# Patient Record
Sex: Female | Born: 1970 | Race: White | Hispanic: No | Marital: Married | Smoking: Current every day smoker
Health system: Southern US, Community
[De-identification: ages and names within clinical notes are randomized; demographics above are authoritative.]

## PROBLEM LIST (undated history)

## (undated) DIAGNOSIS — N289 Disorder of kidney and ureter, unspecified: Secondary | ICD-10-CM

## (undated) DIAGNOSIS — I639 Cerebral infarction, unspecified: Secondary | ICD-10-CM

## (undated) DIAGNOSIS — Z87898 Personal history of other specified conditions: Secondary | ICD-10-CM

## (undated) DIAGNOSIS — J45909 Unspecified asthma, uncomplicated: Secondary | ICD-10-CM

## (undated) DIAGNOSIS — R0989 Other specified symptoms and signs involving the circulatory and respiratory systems: Secondary | ICD-10-CM

## (undated) DIAGNOSIS — N189 Chronic kidney disease, unspecified: Secondary | ICD-10-CM

## (undated) DIAGNOSIS — M109 Gout, unspecified: Secondary | ICD-10-CM

## (undated) DIAGNOSIS — J309 Allergic rhinitis, unspecified: Secondary | ICD-10-CM

## (undated) DIAGNOSIS — T7840XA Allergy, unspecified, initial encounter: Secondary | ICD-10-CM

## (undated) DIAGNOSIS — K529 Noninfective gastroenteritis and colitis, unspecified: Secondary | ICD-10-CM

## (undated) DIAGNOSIS — G43909 Migraine, unspecified, not intractable, without status migrainosus: Secondary | ICD-10-CM

## (undated) DIAGNOSIS — E786 Lipoprotein deficiency: Secondary | ICD-10-CM

## (undated) DIAGNOSIS — R55 Syncope and collapse: Secondary | ICD-10-CM

## (undated) DIAGNOSIS — K219 Gastro-esophageal reflux disease without esophagitis: Secondary | ICD-10-CM

## (undated) DIAGNOSIS — I219 Acute myocardial infarction, unspecified: Secondary | ICD-10-CM

## (undated) DIAGNOSIS — G5600 Carpal tunnel syndrome, unspecified upper limb: Secondary | ICD-10-CM

## (undated) DIAGNOSIS — I10 Essential (primary) hypertension: Secondary | ICD-10-CM

## (undated) HISTORY — DX: Migraine, unspecified, not intractable, without status migrainosus: G43.909

## (undated) HISTORY — DX: Personal history of other specified conditions: Z87.898

## (undated) HISTORY — DX: Carpal tunnel syndrome, unspecified upper limb: G56.00

## (undated) HISTORY — DX: Allergic rhinitis, unspecified: J30.9

## (undated) HISTORY — DX: Chronic kidney disease, unspecified: N18.9

## (undated) HISTORY — PX: DILATION AND CURETTAGE OF UTERUS: SHX78

## (undated) HISTORY — DX: Unspecified asthma, uncomplicated: J45.909

## (undated) HISTORY — DX: Essential (primary) hypertension: I10

## (undated) HISTORY — PX: CARDIAC CATHETERIZATION: SHX172

## (undated) HISTORY — DX: Syncope and collapse: R55

## (undated) HISTORY — DX: Cerebral infarction, unspecified: I63.9

## (undated) HISTORY — DX: Lipoprotein deficiency: E78.6

## (undated) HISTORY — DX: Other specified symptoms and signs involving the circulatory and respiratory systems: R09.89

## (undated) HISTORY — DX: Gastro-esophageal reflux disease without esophagitis: K21.9

## (undated) HISTORY — DX: Gout, unspecified: M10.9

## (undated) HISTORY — DX: Allergy, unspecified, initial encounter: T78.40XA

---

## 2006-01-04 DIAGNOSIS — I10 Essential (primary) hypertension: Secondary | ICD-10-CM

## 2006-01-04 HISTORY — DX: Essential (primary) hypertension: I10

## 2007-09-26 ENCOUNTER — Other Ambulatory Visit: Payer: Self-pay

## 2007-09-26 ENCOUNTER — Emergency Department: Payer: Self-pay | Admitting: Emergency Medicine

## 2011-01-23 ENCOUNTER — Emergency Department: Payer: Self-pay | Admitting: Emergency Medicine

## 2011-07-19 ENCOUNTER — Inpatient Hospital Stay: Payer: Self-pay | Admitting: Internal Medicine

## 2011-07-19 LAB — COMPREHENSIVE METABOLIC PANEL
Albumin: 4.2 g/dL (ref 3.4–5.0)
Alkaline Phosphatase: 93 U/L (ref 50–136)
BUN: 11 mg/dL (ref 7–18)
Creatinine: 1.58 mg/dL — ABNORMAL HIGH (ref 0.60–1.30)
EGFR (African American): 48 — ABNORMAL LOW
EGFR (Non-African Amer.): 41 — ABNORMAL LOW
Glucose: 80 mg/dL (ref 65–99)
Osmolality: 265 (ref 275–301)
Potassium: 4.1 mmol/L (ref 3.5–5.1)
SGOT(AST): 16 U/L (ref 15–37)
Sodium: 133 mmol/L — ABNORMAL LOW (ref 136–145)
Total Protein: 7.6 g/dL (ref 6.4–8.2)

## 2011-07-19 LAB — URINALYSIS, COMPLETE
Hyaline Cast: 1
Leukocyte Esterase: NEGATIVE
Nitrite: NEGATIVE
Ph: 5 (ref 4.5–8.0)
Protein: 30
RBC,UR: 4 /HPF (ref 0–5)
Squamous Epithelial: 2

## 2011-07-19 LAB — CBC
HCT: 39.2 % (ref 35.0–47.0)
HGB: 13.1 g/dL (ref 12.0–16.0)
MCHC: 33.4 g/dL (ref 32.0–36.0)
RBC: 4.05 10*6/uL (ref 3.80–5.20)
WBC: 5.5 10*3/uL (ref 3.6–11.0)

## 2011-07-19 LAB — DRUG SCREEN, URINE
Benzodiazepine, Ur Scrn: NEGATIVE (ref ?–200)
Cannabinoid 50 Ng, Ur ~~LOC~~: NEGATIVE (ref ?–50)
MDMA (Ecstasy)Ur Screen: NEGATIVE (ref ?–500)
Opiate, Ur Screen: NEGATIVE (ref ?–300)
Phencyclidine (PCP) Ur S: NEGATIVE (ref ?–25)

## 2011-07-19 LAB — PREGNANCY, URINE: Pregnancy Test, Urine: NEGATIVE m[IU]/mL

## 2011-07-19 LAB — CK TOTAL AND CKMB (NOT AT ARMC): CK-MB: 1 ng/mL (ref 0.5–3.6)

## 2011-07-19 LAB — TROPONIN I: Troponin-I: 0.15 ng/mL — ABNORMAL HIGH

## 2011-07-20 DIAGNOSIS — G459 Transient cerebral ischemic attack, unspecified: Secondary | ICD-10-CM

## 2011-07-20 DIAGNOSIS — R748 Abnormal levels of other serum enzymes: Secondary | ICD-10-CM

## 2011-07-20 LAB — BASIC METABOLIC PANEL
Calcium, Total: 8.4 mg/dL — ABNORMAL LOW (ref 8.5–10.1)
Chloride: 103 mmol/L (ref 98–107)
Co2: 24 mmol/L (ref 21–32)
Osmolality: 266 (ref 275–301)
Potassium: 3.9 mmol/L (ref 3.5–5.1)

## 2011-07-20 LAB — CK TOTAL AND CKMB (NOT AT ARMC)
CK, Total: 64 U/L (ref 21–215)
CK-MB: 1 ng/mL (ref 0.5–3.6)
CK-MB: 0.7 ng/mL (ref 0.5–3.6)

## 2011-07-20 LAB — MAGNESIUM: Magnesium: 1.7 mg/dL — ABNORMAL LOW

## 2011-07-20 LAB — LIPID PANEL
Cholesterol: 145 mg/dL (ref 0–200)
Ldl Cholesterol, Calc: 104 mg/dL — ABNORMAL HIGH (ref 0–100)
Triglycerides: 95 mg/dL (ref 0–200)

## 2011-07-20 LAB — TROPONIN I
Troponin-I: 0.16 ng/mL — ABNORMAL HIGH
Troponin-I: 0.15 ng/mL — ABNORMAL HIGH

## 2011-07-30 ENCOUNTER — Encounter: Payer: Self-pay | Admitting: Cardiovascular Disease

## 2011-08-11 ENCOUNTER — Encounter: Payer: Self-pay | Admitting: *Deleted

## 2011-08-12 ENCOUNTER — Encounter: Payer: Self-pay | Admitting: Cardiovascular Disease

## 2011-08-12 ENCOUNTER — Ambulatory Visit (INDEPENDENT_AMBULATORY_CARE_PROVIDER_SITE_OTHER): Payer: Self-pay | Admitting: Cardiovascular Disease

## 2011-08-12 VITALS — BP 117/75 | HR 58 | Ht 61.0 in | Wt 135.2 lb

## 2011-08-12 DIAGNOSIS — R0602 Shortness of breath: Secondary | ICD-10-CM

## 2011-08-12 DIAGNOSIS — I517 Cardiomegaly: Secondary | ICD-10-CM | POA: Insufficient documentation

## 2011-08-12 DIAGNOSIS — F17200 Nicotine dependence, unspecified, uncomplicated: Secondary | ICD-10-CM

## 2011-08-12 DIAGNOSIS — I10 Essential (primary) hypertension: Secondary | ICD-10-CM | POA: Insufficient documentation

## 2011-08-12 DIAGNOSIS — N189 Chronic kidney disease, unspecified: Secondary | ICD-10-CM | POA: Insufficient documentation

## 2011-08-12 MED ORDER — CLONIDINE HCL 0.1 MG PO TABS: 0.1000 mg | ORAL_TABLET | Freq: Two times a day (BID) | ORAL | Status: DC

## 2011-08-12 MED ORDER — LISINOPRIL-HYDROCHLOROTHIAZIDE 20-12.5 MG PO TABS: 1.0000 | ORAL_TABLET | Freq: Every day | ORAL | Status: DC

## 2011-08-12 NOTE — Patient Instructions (Addendum)
You are doing well. Please continue clonidine Decrease the lisinopril HCTZ to 20/12.5 mg daily Drink plenty of fluids  Please call us if you have new issues that need to be addressed before your next appt.  Your physician wants you to follow-up in: 12 months.  You will receive a reminder letter in the mail two months in advance. If you don't receive a letter, please call our office to schedule the follow-up appointment.

## 2011-08-12 NOTE — Assessment & Plan Note (Addendum)
Moderate LVH likely secondary to chronic hypertension

## 2011-08-12 NOTE — Assessment & Plan Note (Signed)
We will reduce her HCTZ, encourage by mouth fluid intake

## 2011-08-12 NOTE — Assessment & Plan Note (Signed)
We have encouraged her to continue to work on weaning her cigarettes and smoking cessation. She will continue to work on this and does not want any assistance with chantix.  

## 2011-08-12 NOTE — Assessment & Plan Note (Signed)
Blood pressure is improved on her current medication regimen. Given recent renal dysfunction on blood work from the hospital, creatinine 1.6, we will decrease her HCTZ to 12.5 mg daily with lisinopril 20 mg daily.

## 2011-08-12 NOTE — Progress Notes (Signed)
Patient ID: Gabrielle Schultz, female    DOB: [birth year], 41 y.o.   MRN: [id number]  HPI Comments: Ms. Fairfax is a 41 year old woman with recent hospital admission in mid July 2013 for headache, nausea, right side facial tingling and numbness, found to have severe hypertension. She presents for followup from the hospital.  She reports hypertension dating back to 2008. She smokes one half pack per day. Previous echocardiogram May 2008 showed normal early function, mild to moderate AI, mild MR and TR, moderate LVH  Notes indicate chronic headaches, labile blood pressures. Blood pressure was more than 200 on arrival to the hospital Also found to have elevated creatinine 1.6 She was discharged after medication changes She reports that her blood pressure has been relatively well controlled since discharge she feels relatively well.  Echocardiogram dated 07/20/2011 shows ejection fraction 50%, moderate LVH, mild TR and mild MR CT head was essentially normal Carotid ultrasound was essentially normal MR of the brain showed areas of increased signal in the white matter distribution  EKG today shows sinus bradycardia with rate 48 beats per minute, nonspecific ST abnormality in the V5, V6, 1 and aVL consistent with LVH with strain pattern.     Outpatient Encounter Prescriptions as of 08/12/2011  Medication Sig Dispense Refill  . aspirin 81 MG tablet Take 81 mg by mouth daily.      . cloNIDine (CATAPRES) 0.1 MG tablet Take 1 tablet (0.1 mg total) by mouth 2 (two) times daily.  60 tablet  11  . ranitidine (ZANTAC) 150 MG tablet Take 150 mg by mouth daily.      Marland Kitchen  lisinopril-hydrochlorothiazide (PRINZIDE,ZESTORETIC) 20-25 MG per tablet Take 1 tablet by mouth daily.       . nicotine (NICODERM CQ - DOSED IN MG/24 HOURS) 21 mg/24hr patch Place 1 patch onto the skin daily.         Review of Systems  Constitutional: Negative.   HENT: Negative.   Eyes: Negative.   Respiratory: Negative.   Cardiovascular:  Negative.   Gastrointestinal: Negative.   Musculoskeletal: Negative.   Skin: Negative.   Neurological: Negative.   Hematological: Negative.   Psychiatric/Behavioral: Negative.   All other systems reviewed and are negative.    BP 117/75  Pulse 58  Ht 5\' 1"  (1.549 m)  Wt 135 lb 4 oz (61.349 kg)  BMI 25.56 kg/m2  Physical Exam  Nursing note and vitals reviewed. Constitutional: She is oriented to person, place, and time. She appears well-developed and well-nourished.  HENT:  Head: Normocephalic.  Nose: Nose normal.  Mouth/Throat: Oropharynx is clear and moist.  Eyes: Conjunctivae are normal. Pupils are equal, round, and reactive to light.  Neck: Normal range of motion. Neck supple. No JVD present.  Cardiovascular: Normal rate, regular rhythm, S1 normal, S2 normal, normal heart sounds and intact distal pulses.  Exam reveals no gallop and no friction rub.   No murmur heard. Pulmonary/Chest: Effort normal and breath sounds normal. No respiratory distress. She has no wheezes. She has no rales. She exhibits no tenderness.  Abdominal: Soft. Bowel sounds are normal. She exhibits no distension. There is no tenderness.  Musculoskeletal: Normal range of motion. She exhibits no edema and no tenderness.  Lymphadenopathy:    She has no cervical adenopathy.  Neurological: She is alert and oriented to person, place, and time. Coordination normal.  Skin: Skin is warm and dry. No rash noted. No erythema.  Psychiatric: She has a normal mood and affect. Her behavior is normal. Judgment  and thought content normal.         Assessment and Plan

## 2012-10-19 ENCOUNTER — Inpatient Hospital Stay: Payer: Self-pay | Admitting: Specialist

## 2012-10-19 LAB — COMPREHENSIVE METABOLIC PANEL
Albumin: 3.7 g/dL (ref 3.4–5.0)
Bilirubin,Total: 0.5 mg/dL (ref 0.2–1.0)
Chloride: 106 mmol/L (ref 98–107)
Creatinine: 1.81 mg/dL — ABNORMAL HIGH (ref 0.60–1.30)
EGFR (African American): 40 — ABNORMAL LOW
Osmolality: 280 (ref 275–301)
Potassium: 3.7 mmol/L (ref 3.5–5.1)
SGPT (ALT): 16 U/L (ref 12–78)
Sodium: 137 mmol/L (ref 136–145)
Total Protein: 7.7 g/dL (ref 6.4–8.2)

## 2012-10-19 LAB — URINALYSIS, COMPLETE
Bilirubin,UR: NEGATIVE
Glucose,UR: NEGATIVE mg/dL (ref 0–75)
Ketone: NEGATIVE
Leukocyte Esterase: NEGATIVE
Ph: 5 (ref 4.5–8.0)
Protein: 30
RBC,UR: <1 /HPF (ref 0–5)

## 2012-10-19 LAB — TROPONIN I: Troponin-I: 0.13 ng/mL — ABNORMAL HIGH

## 2012-10-19 LAB — CBC
HCT: 46.2 % (ref 35.0–47.0)
MCH: 32.2 pg (ref 26.0–34.0)
MCV: 96 fL (ref 80–100)
Platelet: 256 10*3/uL (ref 150–440)
RBC: 4.8 10*6/uL (ref 3.80–5.20)
RDW: 13.1 % (ref 11.5–14.5)
WBC: 22.3 10*3/uL — ABNORMAL HIGH (ref 3.6–11.0)

## 2012-10-19 LAB — PREGNANCY, URINE: Pregnancy Test, Urine: NEGATIVE m[IU]/mL

## 2012-10-19 LAB — CK TOTAL AND CKMB (NOT AT ARMC)
CK, Total: 57 U/L (ref 21–215)
CK-MB: 1.6 ng/mL (ref 0.5–3.6)

## 2012-10-20 DIAGNOSIS — R079 Chest pain, unspecified: Secondary | ICD-10-CM

## 2012-10-20 LAB — TROPONIN I
Troponin-I: 0.16 ng/mL — ABNORMAL HIGH
Troponin-I: 0.15 ng/mL — ABNORMAL HIGH

## 2012-10-20 LAB — CBC WITH DIFFERENTIAL/PLATELET
Basophil %: 0.5 %
Eosinophil #: 0.2 10*3/uL (ref 0.0–0.7)
Eosinophil %: 2.4 %
HCT: 31.6 % — ABNORMAL LOW (ref 35.0–47.0)
HGB: 10.9 g/dL — ABNORMAL LOW (ref 12.0–16.0)
Lymphocyte #: 2.6 10*3/uL (ref 1.0–3.6)
Lymphocyte %: 28.3 %
MCH: 32.6 pg (ref 26.0–34.0)
MCHC: 34.5 g/dL (ref 32.0–36.0)
MCV: 95 fL (ref 80–100)
Monocyte #: 0.6 x10 3/mm (ref 0.2–0.9)
Monocyte %: 6.9 %
Neutrophil #: 5.6 10*3/uL (ref 1.4–6.5)
Platelet: 150 10*3/uL (ref 150–440)
RBC: 3.34 10*6/uL — ABNORMAL LOW (ref 3.80–5.20)
RDW: 13.2 % (ref 11.5–14.5)

## 2012-10-20 LAB — BASIC METABOLIC PANEL
BUN: 13 mg/dL (ref 7–18)
Calcium, Total: 8.5 mg/dL (ref 8.5–10.1)
Chloride: 109 mmol/L — ABNORMAL HIGH (ref 98–107)
EGFR (African American): 45 — ABNORMAL LOW
EGFR (Non-African Amer.): 39 — ABNORMAL LOW
Glucose: 99 mg/dL (ref 65–99)
Osmolality: 278 (ref 275–301)
Potassium: 4 mmol/L (ref 3.5–5.1)
Sodium: 139 mmol/L (ref 136–145)

## 2012-10-20 LAB — LIPID PANEL
Cholesterol: 123 mg/dL (ref 0–200)
Ldl Cholesterol, Calc: 86 mg/dL (ref 0–100)
Triglycerides: 85 mg/dL (ref 0–200)
VLDL Cholesterol, Calc: 17 mg/dL (ref 5–40)

## 2012-10-20 LAB — CK TOTAL AND CKMB (NOT AT ARMC): CK-MB: 1.6 ng/mL (ref 0.5–3.6)

## 2012-11-01 ENCOUNTER — Telehealth: Payer: Self-pay

## 2012-11-01 NOTE — Telephone Encounter (Signed)
Request from [place] , sent to [place] on 11/01/2012.

## 2012-12-29 ENCOUNTER — Ambulatory Visit: Payer: Self-pay | Admitting: Ophthalmology

## 2013-05-18 ENCOUNTER — Inpatient Hospital Stay: Payer: Self-pay | Admitting: Internal Medicine

## 2013-05-18 LAB — CBC
HCT: 42.1 %
HGB: 14.2 g/dL
MCH: 31.6 pg
MCHC: 33.6 g/dL
MCV: 94 fL
Platelet: 241 x10 3/mm 3
RBC: 4.48 X10 6/mm 3
RDW: 12.5 %
WBC: 16.9 x10 3/mm 3 — ABNORMAL HIGH

## 2013-05-18 LAB — COMPREHENSIVE METABOLIC PANEL
ALBUMIN: 3.8 g/dL (ref 3.4–5.0)
ALK PHOS: 196 U/L — AB
ANION GAP: 11 (ref 7–16)
BUN: 21 mg/dL — ABNORMAL HIGH (ref 7–18)
Bilirubin,Total: 0.5 mg/dL (ref 0.2–1.0)
CHLORIDE: 89 mmol/L — AB (ref 98–107)
CO2: 21 mmol/L (ref 21–32)
Calcium, Total: 9 mg/dL (ref 8.5–10.1)
Creatinine: 2.05 mg/dL — ABNORMAL HIGH (ref 0.60–1.30)
EGFR (African American): 34 — ABNORMAL LOW
EGFR (Non-African Amer.): 30 — ABNORMAL LOW
Glucose: 112 mg/dL — ABNORMAL HIGH (ref 65–99)
Osmolality: 248 (ref 275–301)
Potassium: 3.4 mmol/L — ABNORMAL LOW (ref 3.5–5.1)
SGOT(AST): 29 U/L (ref 15–37)
SGPT (ALT): 18 U/L (ref 12–78)
Sodium: 121 mmol/L — ABNORMAL LOW (ref 136–145)
TOTAL PROTEIN: 7.8 g/dL (ref 6.4–8.2)

## 2013-05-18 LAB — URINALYSIS, COMPLETE
Bilirubin,UR: NEGATIVE
Glucose,UR: NEGATIVE mg/dL (ref 0–75)
Ketone: NEGATIVE
LEUKOCYTE ESTERASE: NEGATIVE
NITRITE: NEGATIVE
PH: 5 (ref 4.5–8.0)
PROTEIN: NEGATIVE
Specific Gravity: 1.006 (ref 1.003–1.030)
Squamous Epithelial: 6

## 2013-05-18 LAB — TROPONIN I: Troponin-I: 0.17 ng/mL — ABNORMAL HIGH

## 2013-05-18 LAB — CLOSTRIDIUM DIFFICILE(ARMC)

## 2013-05-19 LAB — BASIC METABOLIC PANEL
ANION GAP: 9 (ref 7–16)
BUN: 17 mg/dL (ref 7–18)
CALCIUM: 8 mg/dL — AB (ref 8.5–10.1)
CREATININE: 1.7 mg/dL — AB (ref 0.60–1.30)
Chloride: 98 mmol/L (ref 98–107)
Co2: 19 mmol/L — ABNORMAL LOW (ref 21–32)
EGFR (Non-African Amer.): 37 — ABNORMAL LOW
GFR CALC AF AMER: 43 — AB
GLUCOSE: 99 mg/dL (ref 65–99)
Osmolality: 255 (ref 275–301)
POTASSIUM: 3.8 mmol/L (ref 3.5–5.1)
SODIUM: 126 mmol/L — AB (ref 136–145)

## 2013-05-19 LAB — TSH: Thyroid Stimulating Horm: 0.393 u[IU]/mL — ABNORMAL LOW

## 2013-05-19 LAB — CBC WITH DIFFERENTIAL/PLATELET
BASOS PCT: 0.5 %
Basophil #: 0 10*3/uL (ref 0.0–0.1)
Eosinophil #: 0 10*3/uL (ref 0.0–0.7)
Eosinophil %: 0.1 %
HCT: 31.4 % — AB (ref 35.0–47.0)
HGB: 10.9 g/dL — ABNORMAL LOW (ref 12.0–16.0)
Lymphocyte #: 1.1 10*3/uL (ref 1.0–3.6)
Lymphocyte %: 15.1 %
MCH: 32.2 pg (ref 26.0–34.0)
MCHC: 34.6 g/dL (ref 32.0–36.0)
MCV: 93 fL (ref 80–100)
MONO ABS: 0.3 x10 3/mm (ref 0.2–0.9)
Monocyte %: 4 %
Neutrophil #: 5.9 10*3/uL (ref 1.4–6.5)
Neutrophil %: 80.3 %
Platelet: 170 10*3/uL (ref 150–440)
RBC: 3.38 10*6/uL — ABNORMAL LOW (ref 3.80–5.20)
RDW: 12.4 % (ref 11.5–14.5)
WBC: 7.3 10*3/uL (ref 3.6–11.0)

## 2013-05-20 LAB — BASIC METABOLIC PANEL
ANION GAP: 4 — AB (ref 7–16)
BUN: 16 mg/dL (ref 7–18)
Calcium, Total: 7.9 mg/dL — ABNORMAL LOW (ref 8.5–10.1)
Chloride: 107 mmol/L (ref 98–107)
Co2: 21 mmol/L (ref 21–32)
Creatinine: 1.65 mg/dL — ABNORMAL HIGH (ref 0.60–1.30)
EGFR (African American): 45 — ABNORMAL LOW
EGFR (Non-African Amer.): 38 — ABNORMAL LOW
GLUCOSE: 91 mg/dL (ref 65–99)
OSMOLALITY: 265 (ref 275–301)
Potassium: 3.9 mmol/L (ref 3.5–5.1)
Sodium: 132 mmol/L — ABNORMAL LOW (ref 136–145)

## 2013-05-20 LAB — STOOL CULTURE

## 2013-09-28 ENCOUNTER — Other Ambulatory Visit (HOSPITAL_COMMUNITY): Payer: Self-pay | Admitting: Physician Assistant

## 2013-09-28 DIAGNOSIS — Z1231 Encounter for screening mammogram for malignant neoplasm of breast: Secondary | ICD-10-CM

## 2013-10-09 ENCOUNTER — Ambulatory Visit (HOSPITAL_COMMUNITY)
Admission: RE | Admit: 2013-10-09 | Discharge: 2013-10-09 | Disposition: A | Discharge status: Home / Self Care | Payer: No Typology Code available for payment source | Source: Ambulatory Visit | Attending: Physician Assistant | Admitting: Physician Assistant

## 2013-10-09 DIAGNOSIS — Z1231 Encounter for screening mammogram for malignant neoplasm of breast: Secondary | ICD-10-CM

## 2013-10-11 ENCOUNTER — Other Ambulatory Visit: Payer: Self-pay | Admitting: Physician Assistant

## 2013-10-11 DIAGNOSIS — R928 Other abnormal and inconclusive findings on diagnostic imaging of breast: Secondary | ICD-10-CM

## 2013-10-19 ENCOUNTER — Other Ambulatory Visit: Payer: No Typology Code available for payment source

## 2013-11-21 DIAGNOSIS — N184 Chronic kidney disease, stage 4 (severe): Secondary | ICD-10-CM | POA: Insufficient documentation

## 2013-11-21 DIAGNOSIS — N189 Chronic kidney disease, unspecified: Secondary | ICD-10-CM | POA: Insufficient documentation

## 2013-11-21 DIAGNOSIS — N183 Chronic kidney disease, stage 3 unspecified: Secondary | ICD-10-CM | POA: Insufficient documentation

## 2013-11-21 DIAGNOSIS — I639 Cerebral infarction, unspecified: Secondary | ICD-10-CM | POA: Insufficient documentation

## 2014-03-25 DIAGNOSIS — E875 Hyperkalemia: Secondary | ICD-10-CM | POA: Insufficient documentation

## 2014-04-23 NOTE — Consult Note (Signed)
General Aspect 44 year old female  with hypertension since 2008, syncope while walking to bathroom in 2009, weakness and "numb all over" last month, labile blood pressures last month, presenting with some tingling and numbness on the right side of her face and tongue. cardiology was consulted for TIA type symptoms, elevated cardiac enz.  She has chronic headaches, labile blood rpessure. BP last month and on arrival running up higher than 355 systolic. blood pressure on arival oof 218/98, in setting of facial tingling, numbness.  She denies any drug abuse, but her urine drug screen is positive for barbiturates.  Started on BP medications here with good effect ECHO shows moderate LVH, normal EF    Present Illness . SOCIAL HISTORY: The patient smokes half pack per day. She denies any alcohol or drugs. She is married and lives with her husband. She has a 25 year old child.   FAMILY HISTORY: Mother had hyperlipidemia and gastroesophageal reflux disease and heart disease. Father has hypertension, gastroesophageal reflux disease, heart disease, and hyperlipidemia.  Sister had hypertension and end-stage renal disease and was on dialysis. She is currently deceased.  Brother had a myocardial infarction at age of 41.   Physical Exam:   GEN well developed, well nourished, no acute distress    HEENT red conjunctivae    NECK supple    RESP normal resp effort  clear BS    CARD No murmur    ABD denies tenderness  soft    LYMPH negative neck    EXTR negative edema    SKIN normal to palpation    NEURO cranial nerves intact, motor/sensory function intact    PSYCH alert, A+O to time, place, person, good insight   Review of Systems:   Subjective/Chief Complaint tingling and numbess of face    General: No Complaints    Skin: No Complaints    ENT: No Complaints    Eyes: No Complaints    Neck: No Complaints    Respiratory: No Complaints    Cardiovascular: No Complaints     Gastrointestinal: No Complaints    Genitourinary: No Complaints    Vascular: No Complaints    Musculoskeletal: No Complaints    Neurologic: Headache  facial tingling    Hematologic: No Complaints    Endocrine: No Complaints    Psychiatric: No Complaints    Review of Systems: All other systems were reviewed and found to be negative    Medications/Allergies Reviewed Medications/Allergies reviewed     "Poor Circulation":    GERD - Esophageal Reflux:    CVA/Stroke:    Hypertension:    C-Section:        Admit Diagnosis:   TIA ACCELERATED HTN ELEVATED TROPIN: 20-Jul-2011, Active, TIA ACCELERATED HTN ELEVATED TROPIN      Admit Reason:   Hypertension: (401.9) Active, ICD9, Unspecified essential hypertension  Home Medications: Medication Instructions Status  aspirin 81 mg oral tablet, chewable 1 tab(s) orally once a day Active  ranitidine 150 mg oral tablet 1 tab(s) orally 2 times a day Active  hydrochlorothiazide-lisinopril 25 mg-20 mg oral tablet 1 tab(s) orally once a day Active   Lab Results:  Routine Chem:  16-Jul-13 06:18    Result Comment TROPONIN - RESULTS VERIFIED BY REPEAT TESTING.  - CALLED TO NICOLE MINOR AT 0740  - BY GA ON 07/20/11  - READ-BACK PROCESS PERFORMED.  Result(s) reported on 20 Jul 2011 at 07:43AM.   Glucose, Serum 83   BUN 13   Creatinine (comp)  1.61  Sodium, Serum  133   Potassium, Serum 3.9   Chloride, Serum 103   CO2, Serum 24   Calcium (Total), Serum  8.4   Anion Gap  6   Osmolality (calc) 266   eGFR (African American)  47   eGFR (Non-African American)  40 (eGFR values <41mL/min/1.73 m2 may be an indication of chronic kidney disease (CKD). Calculated eGFR is useful in patients with stable renal function. The eGFR calculation will not be reliable in acutely ill patients when serum creatinine is changing rapidly. It is not useful in  patients on dialysis. The eGFR calculation may not be applicable to patients at the low and  high extremes of body sizes, pregnant women, and vegetarians.)   Magnesium, Serum  1.7 (1.8-2.4 THERAPEUTIC RANGE: 4-7 mg/dL TOXIC: > 10 mg/dL  -----------------------)   Cholesterol, Serum 145   Triglycerides, Serum 95   HDL (INHOUSE)  22   VLDL Cholesterol Calculated 19   LDL Cholesterol Calculated  104 (Result(s) reported on 20 Jul 2011 at 07:08AM.)  Cardiac:  16-Jul-13 06:18    Troponin I  0.15 (0.00-0.05 0.05 ng/mL or less: NEGATIVE  Repeat testing in 3-6 hrs  if clinically indicated. >0.05 ng/mL: POTENTIAL  MYOCARDIAL INJURY. Repeat  testing in 3-6 hrs if  clinically indicated. NOTE: An increase or decrease  of 30% or more on serial  testing suggests a  clinically important change)   CK, Total 52   CPK-MB, Serum 0.7 (Result(s) reported on 20 Jul 2011 at 07:43AM.)   EKG:   Interpretation EKG shows SInus bradycardia, rate 54, LVH    [place]ut: Hives, Itching  Vital Signs/Nurse's Notes: **Vital Signs.:   16-Jul-13 12:02   Vital Signs Type Routine   Temperature Temperature (F) 98.3   Celsius 36.8   Temperature Source Oral   Pulse Pulse 55   Respirations Respirations 18   Systolic BP Systolic BP 105   Diastolic BP (mmHg) Diastolic BP (mmHg) 68   Mean BP 80   Pulse Ox % Pulse Ox % 100   Pulse Ox Activity Level  At rest   Oxygen Delivery Room Air/ 21 %     Impression 44 year old female  with hypertension since 2008, syncope while walking to bathroom in 2009, weakness and "numb all over" last month, labile blood pressures last month, presenting with some tingling and numbness on the right side of her face and tongue.   1) HTN; BP improved, may have to hold amlodipine if BP continues to run low Could continue ACE, clonidine Case manager has indicated she could [facility] a [facility]  2) ABN EKG, elevated troponin Likely from LVH in setting of significant HTN LVH likely from long standing HTN  3) Facial numbess, tingling There are  MRI finding, uncertain if this is clinically relevent -Sx could be secondary to severe HTN.  No cardiac source of embolism noted.  No arrhythmia noted on tele. --Recommend strict BP control  4) Positive urine cuilture Uncertain if this is playing a role in her labile blood pressures Recommended she stop recreational drugs  5) Smoking She has an electronic cig and will start to use this more. Currently 1/2 ppd   Electronic Signatures: Julien Nordmann (MD)  (Signed 16-Jul-13 15:11)  Authored: General Aspect/Present Illness, History and Physical Exam, Review of System, Past Medical History, Health Issues, Home Medications, Labs, EKG , Allergies, Vital Signs/Nurse's Notes, Impression/Plan   Last Updated: 16-Jul-13 15:11 by Julien Nordmann (MD)

## 2014-04-23 NOTE — Discharge Summary (Signed)
PATIENT NAME:  Gabrielle Schultz, Gabrielle Schultz MR#:  [id number] DATE OF BIRTH:  11/22/1970  DATE OF ADMISSION:  07/19/2011 DATE OF DISCHARGE:  07/21/2011  DISCHARGE DIAGNOSES:  1. Possible transient ischemic attack with facial numbness and tingling, now resolved. No transient ischemic attack symptoms, likely due to accelerated hypertension and/or bradycardia with magnetic resonance imaging suggestive of microvascular ischemic lesions, not diagnostic of multiple sclerosis.  2. Accelerated hypertension, much improved now.  3. Abnormal electrocardiogram and elevated troponin likely due to supply/demand ischemia due to left ventricular hypertrophy.  4. 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  5. Hyperlipidemia. Diet and exercise control for time being as patient does not want to go on medication. 6. Hypomagnesemia, repleted.   SECONDARY DIAGNOSES:  1. Hypertension.  2. Gastroesophageal reflux disease.  3. Carpal tunnel syndrome.   CONSULTATIONS:  1. Neurology, Dr. Jennings Books.  2. Cardiology, Dr. Ida Rogue.   LABORATORY, DIAGNOSTIC AND RADIOLOGICAL DATA: CT scan of the head without contrast on 07/15 showed no acute intracranial abnormality.   Bilateral carotid Doppler's on 07/15 showed no hemodynamically significant stenosis. MRI of the brain without contrast on 07/16 showed findings suggestive of microvascular ischemic lesion.   2-D echocardiogram on 07/16 showed normal LV systolic function. Ejection fraction 50%. Moderate concentric left ventricular hypertrophy. Mild mitral and tricuspid regurgitation along with mild aortic regurgitation. No apparent source of cerebrovascular accident.   Urinalysis on admission was negative. Urine pregnancy test was negative.   HISTORY AND SHORT HOSPITAL COURSE: Patient is a 44 year old female with above-mentioned medical problems was admitted for possible transient ischemic attack  with facial tingling and numbness. Please see Dr. Kelton Pillar dictated history and physical for further details. Cardiology consultation was obtained with Dr. Ida Rogue who recommended echocardiogram which was obtained with results dictated above. Patient had borderline elevated troponin along with abnormal EKG which was thought to be due to left ventricular hypertrophy and long-standing hypertension and likely supply/demand ischemia. Patient underwent MRI of the brain which showed some abnormal results for which neurology consultation was obtained with Dr. Jennings Books who felt MRI findings more suggestive of microvascular ischemic lesion other than multiple sclerosis. Patient was feeling much better, was close to baseline. Her blood pressure was also improved and was discharged home on 07/17 in stable condition.   PHYSICAL EXAMINATION: VITAL SIGNS: On the date of discharge her vital signs were as follows: Temperature 98.3, heart rate 53 per minute, respirations 18 per minute, blood pressure 110/62 mmHg. She was saturating 98% to 100% on room air. Pertinent Physical Examination On the date of discharge: CARDIOVASCULAR: S1, S2 normal. No murmur, rubs, gallop. LUNGS: Clear to auscultation bilaterally. No wheezing, rales, rhonchi, crepitation. ABDOMEN: Soft, benign. NEUROLOGIC: Nonfocal examination. All other physical examination remained at the baseline.   DISCHARGE MEDICATIONS:  1. Aspirin 81 mg p.o. daily.  2. Ranitidine 150 mg p.o. b.i.d.  3. Hydrochlorothiazide/lisinopril 25/20, 1 tablet p.o. daily.  4. Clonidine 0.1 mg 1 tablet p.o. b.i.d. to be held if systolic blood pressure less than 100. 5. Nicotine 21 mg patch transdermal daily.   DISCHARGE DIET: Low sodium.   DISCHARGE ACTIVITY: As tolerated.     DISCHARGE INSTRUCTIONS AND FOLLOW UP:  1. Patient was instructed to follow up with her primary care physician at [facility] (new PCP).  2. She will need follow up with Dr. [name] from  [facility] in 2 to 4 weeks.   TOTAL  TIME DISCHARGING THIS PATIENT: 55 minutes.  ____________________________ Gabrielle Schultz. [name], MD vss:cms D: 07/21/2011 16:25:27 ET T: 07/22/2011 11:30:36 ET JOB#: [id number]  cc: [name], MD, <Dictator> [facility] [name], MD [name], [place] MD ELECTRONICALLY SIGNED 07/22/2011 22:47

## 2014-04-23 NOTE — Consult Note (Signed)
PATIENT NAME:  Gabrielle Schultz, Gabrielle Schultz MR#:  [id number] DATE OF BIRTH:  January 26, 1970  DATE OF CONSULTATION:  07/20/2011  REFERRING PHYSICIAN:  Dr. Max Sane  CONSULTING PHYSICIAN:  Io Dieujuste K. [name], MD  REASON FOR CONSULTATION: Right-sided facial tingling and numbness which has resolved, concern for MS due to abnormal MRI of the brain.  HISTORY OF PRESENT ILLNESS: [name] is a 44 year old Caucasian female who has a long-standing history of uncontrolled hypertension at least since 2008 with a strong family history of hypertensive nephropathy related death in the sister.   She mentioned that on Sunday, July 14, around 10:00 p.m. she has a sudden onset of numbness on her right lower face which lasted for around 15 minutes or so.   She also had some headache and some nausea with it, came to the ER, found to have very high blood pressure of 218/98.   Patient had a similar numbness back in June 2013 where she felt like there was whole body numbness including her face and entire body, front and back.    Patient had a similar sensation of numbness back in 2009.   Patient [place]ing [place]   She feels like she has occasional blurry vision in both eyes and she has to wear glasses but she denied any history suggestive of optic neuritis or diplopia.   She feels like her eyes are very sensitive to light.   Patient denied any history suggestive of optic neuritis or any focal weakness. She denied any symptoms of gallbladder issues.   She had a negative Uhthoff phenomenon and negative Lhermitte sign.    Patient grew up in [place]. She does not have a known vitamin D deficiency.   She denied having history of mono infection during her teenage years.  PAST MEDICAL HISTORY:  1. Hypertension.  2. Acid reflux. 3. Right hand carpal tunnel syndrome.   MEDICATIONS: I reviewed her home medication list but she is taking her antihypertensive medications very  infrequently due to financial reasons.   PAST SURGICAL HISTORY: Negative.   SOCIAL HISTORY: Significant that she smokes 1/2 pack per day. She denies any alcohol. She is living with her husband and married and she has a 81 year old child.   FAMILY HISTORY: Significant that mother has hyperlipidemia, gastroesophageal reflux disease and heart disease. Father has hypertension, gastroesophageal reflux disease, heart disease and hyperlipidemia. Sister has hypertension and end-stage renal disease, was on dialysis and died. Her brother had MI at the age of 75.   REVIEW OF SYSTEMS: 10 system review of system was asked and found negative except headache and numbness which has resolved. Nausea has resolved.   She still has some light sensitivity in her eyes but she feels like her vision is okay.   Other 10 system review of system was asked and was found to be negative.   PHYSICAL EXAMINATION:  VITAL SIGNS: Temperature 98.8, pulse 54, respiratory rate 18, blood pressure 158/78, pulse oximetry 100%.   GENERAL: She is a middle-aged Caucasian female, smells of cigarette smoke here by herself lying in the bed.   She is not in acute distress. She is well built, well nourished. She has poor dentition.   LUNGS: Clear to auscultation.   HEART: S1, S2 heart sounds. Carotid exam did not reveal any bruit.   HEENT: Her funduscopic exam was attempted but patient cannot keep her eyes straight and had upward movement and I could not see her retina  or disc really well.    MENTAL STATUS EXAMINATION: She is alert, oriented, followed two-step inverted commands. Her attention, concentration, and memory were appropriate.   Her fund of knowledge is adequate for the current events.   On her cranial nerves, her pupils are equal, round, and reactive. Extraocular movements were intact. Her visual fields seemed full. Her face was symmetric. Tongue was midline. Facial sensations were intact. Her hearing was intact. Her  shoulder shrug seemed to be okay.   Her neck is supple.   On her motor exam she has normal tone and strength of 5/5 in all extremities.   Her sensory exam was intact to light touch. Her reflexes were symmetric. Her toes were downgoing.   Her gait seems to be okay.   LABORATORY, DIAGNOSTIC AND RADIOLOGICAL DATA: On her MRI of the brain, she has white matter flare hyperintensities in her bilateral subcortical frontal white matter, left more than right. She also has 1 or 2 lesions which seems to be periventricular.   During this MRI I did not see any FLAIR sequence on sagittal plane.   ASSESSMENT AND PLAN:  1. Transient right facial numbness with significantly elevated blood pressure with headache and nausea likely in a patient who has abnormal MRI of the brain as described above.   There was a concern for multiple sclerosis but her history is not very classic for demyelinating disease but it is possible.   I would like to obtain MRI of the brain and cervical spine with and without contrast to see if she has any actively demyelinating lesions.   I would also like to obtain CSF evaluation for MS labs such as IgG index, myelin basic protein, glucose protein, cell count, etc.   Blood work-wise I would like to obtain at least ESR, vitamin D, vitamin B12, titer function tests, methylmalonic acid, etc.   I explained the patient demyelinating diseases such multiple sclerosis, etiology, pathogenesis and progression.   Patient understands this but would like to hold off on further neurological workup to rule out demyelinating disease due to financial reasons which I understand.   2. Most likely explanation of her MRI changes as well as symptoms can be explained that white matter microvascular ischemic changes in patients with vascular diseases such as hypertension and smoking. She has long-standing uncontrolled hypertension. She has even secondly changes such as chronic kidney disease as well as  moderate left ventricular hypertrophy.   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   Patient with uncontrolled hypertension can have persistent headache and her headache has been resolved after getting antihypertensive medications.   I tried to explain her the importance of having good blood pressure control and smoking sedation.   Mention dash diet.    I will see her on a p.r.n. basis. Feel free to contact me with any further questions.   ____________________________ Royetta Crochet. [name], MD hks:cms D: 07/20/2011 22:07:06 ET T: 07/21/2011 09:33:50 ET  JOB#: [id number] cc: Jarom Govan K. [name], MD, <Dictator> Royetta Crochet [facility] MD ELECTRONICALLY SIGNED 07/26/2011 7:15
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
## 2014-04-23 NOTE — Consult Note (Signed)
Brief Consult Note: Diagnosis: abonormal MRI - concern for MS, likely Hypertensive whitematter microvascular changes.   Patient was seen by consultant.   Consult note dictated.   Orders entered.   Comments: - MRI of the brain shows sucortical, some periventricular (one or 2) WM lesions but mostly close to cortex - frontal. - history not strongly suggestive of MS - Pt has long standing uncontrolled HTN with CKD, moderate LVH, smoking etc - so WM lesions likely micro-vascular ischemic lesions. - pt was offered, MRI brain, c-spine with and without contrast,CSF evaluation and blood work for MS - she would like to hold off work up and further neurological evaluation for financial reasons. Will honor her request.  Electronic Signatures: Ray Church (MD)  (Signed 16-Jul-13 21:58)  Authored: Brief Consult Note   Last Updated: 16-Jul-13 21:58 by Ray Church (MD)

## 2014-04-26 NOTE — Consult Note (Signed)
PATIENT NAME:  Gabrielle Schultz, Gabrielle Schultz MR#:  [id number] DATE OF BIRTH:  1970/05/15  DATE OF CONSULTATION:  10/19/2012  REFERRING PHYSICIAN:  Dr. Margaretmary Eddy CONSULTING PHYSICIAN:  Corey Skains, MD  REASON FOR CONSULTATION: Subendocardial myocardial infarction.   CHIEF COMPLAINT: "I have chest pain."   HISTORY OF PRESENT ILLNESS: This is a 44 year old female with tobacco abuse and hypertension and strong family history of cardiovascular disease. She had some abdominal discomfort and chest discomfort radiating into her back associated with shortness breath for approximately 1 hour and was seen in the Emergency Room with an EKG showing normal sinus rhythm with ST depression of 1 to 2 mm throughout the precordium. She had relief with nitroglycerin, oxygen and aspirin. Now her EKG is normal. She has a troponin of 0.13, creatinine of 1.8, otherwise she is having no other cardiovascular symptoms at this time.   REVIEW OF SYSTEMS: The remainder of review of systems including no evidence of vision change, ringing in the ears, hearing loss, cough, congestion, heartburn, nausea, vomiting, diarrhea, bloody stools, stomach pain, extremity pain, leg weakness, cramping in buttocks, known blood clots, headaches, blackouts, dizzy spells, nosebleeds, congestion, trouble swallowing, frequent urination, urination at night, muscle weakness, numbness, anxiety, depression, skin lesions or skin rashes.   PAST MEDICAL HISTORY:  1.  Hypertension.  2.  Hyperlipidemia.   FAMILY HISTORY: A strong family history of cardiovascular disease or early onset of cardiovascular disease with her father, mother did not.   SOCIAL HISTORY: Smokes 1 pack per day, occasionally drinking alcohol.   ALLERGIES: As listed.   MEDICATIONS:  As listed.    PHYSICAL EXAMINATION:  VITAL SIGNS: Blood pressure 122/68 bilaterally, heart rate 72 upright, reclining, and regular.  GENERAL: She is a well appearing female in no acute distress.  HEENT: No  icterus, thyromegaly, ulcers, hemorrhage, or xanthelasma.  CARDIOVASCULAR: Regular rate and rhythm. Normal S1, S2 without murmur, gallop, or rub. PMI is normal size and placement. Carotids are normal without bruit. Jugular venous pressure is normal.  LUNGS: Clear to auscultation with normal respirations.  ABDOMEN: Soft with some mild tenderness. No hepatosplenomegaly or masses. Abdominal aorta is normal size without bruit.  EXTREMITIES: Shows 2+ bilateral pulses in dorsal, pedal, radial and femoral arteries without lower extremity edema, cyanosis, clubbing or ulcers.  NEUROLOGIC: She is oriented to time, place, and person, with normal mood and affect.   ASSESSMENT: A 44 year old female with acute subendocardial myocardial infarction with EKG changes and history of cardiovascular risk factors.   RECOMMENDATIONS:  1.  Admit to telemetry with telemetry unit nursing.  2.  Heparin, nitroglycerin, oxygen and possible use of beta blocker if necessary.  3.  Proceed to cardiac catheterization to assess coronary anatomy and further treatment thereof as necessary. The patient understands the risks and benefits of cardiac catheterization. These include the possibility of death, stroke, heart attack, infection, bleeding or blood clot. She is at low risk for conscious sedation. 4.  Hypertension control with ACE inhibitor.  5.  Echocardiogram for LV dysfunction, valvular heart disease causing above symptoms.  6.  Further treatment options after above.  ____________________________ Corey Skains, MD bjk:aw D: 10/19/2012 08:26:16 ET T: 10/19/2012 08:35:04 ET JOB#: [id number]  cc: Corey Skains, MD, <Dictator> Corey Skains MD ELECTRONICALLY SIGNED 10/29/2012 8:52

## 2014-04-26 NOTE — Discharge Summary (Signed)
PATIENT NAME:  Gabrielle Schultz, Gabrielle Schultz MR#:  [id number] DATE OF BIRTH:  1970-10-28  For a detailed note, please take a look at the history and physical done on admission by Dr. Margaretmary Eddy.   DIAGNOSES AT DISCHARGE:  As follows:  1.  Acute colitis.  2.  Hypertension.  3.  Elevated troponin in the setting of demand ischemia.  4.  Chronic renal insufficiency.  5.  Gastroesophageal reflux disease.   DIET: The patient is being discharged on a low-sodium diet.   ACTIVITY: As tolerated.   FOLLOW-UP: [facility] in the  next 1 to 2 weeks.   DISCHARGE MEDICATIONS: Aspirin 81 mg daily, ranitidine 150 mg b.i.d., lisinopril 20 mg daily, clonidine 0.1 mg b.i.d., ciprofloxacin 250 mg b.i.d. x7 days, and Flagyl 5 mg b.i.d. x7 days.   PERTINENT STUDIES DONE DURING THE HOSPITAL COURSE: A CT scan of the abdomen and pelvis done without contrast on admission showing no findings suggestive of colitis, a small amount of free pelvic fluid.   A chest x-ray done on admission showing mild left lower lobe pneumonia.   A cardiac catheterization done on October 14 showing ejection fraction to be 75% and moderate noncritical 3-vessel coronary artery disease.   HOSPITAL COURSE: This is a 44 year old female who presented to the hospital with abdominal pain, nausea, vomiting and diarrhea and was incidentally also noted to have an elevated troponin.   1.  Acute colitis. This was likely the cause of patient's abdominal pain, nausea, vomiting and diarrhea. The patient was admitted to the hospital and started on IV Levaquin and Flagyl. Stool cultures were obtained for C. diff and stool for comprehensive culture and ova and parasites although they could not be collected as the patient had clinically significantly improved and had no worsening diarrhea while in the hospital. The patient presently is empirically being treated on IV ciprofloxacin and Flagyl and being discharged on oral Ciprofloaxacin and Flagyl for a few more days.  She was advanced to a regular diet which she is tolerating well without any further diarrhea, nausea, vomiting, or abdominal pain. She did have a leukocytosis of 22,000 and it has come down to as low as 9000 and she has been afebrile for the past 24 hours.  2.  Elevated troponin with the abnormal EKG. The patient had a troponin as high as 0.13. She had ST depressions in the lateral leads on admission EKG. A cardiology consult was obtained. The patient was initially started on a heparin drip, maintained on aspirin, beta blocker and also a statin. She underwent a cardiac catheterization done by Dr. Nehemiah Massed which showed noncritical 3-vessel coronary artery disease. She is currently chest pain-free and hemodynamically stable. Her elevated troponin is likely in the setting of chronic renal insufficiency and probably demand ischemia with no evidence of acute coronary syndrome.  3.  Hypertension. The patient remained hemodynamically stable. She will continue her clonidine and lisinopril.  4.  Chronic renal insufficiency. The patient's baseline creatinine is around 1.5 to 1.6. Her creatinine is currently at baseline. This further needs to be followed by her primary care physician.  5.  GERD. The patient was maintained on ranitidine and she will resume that upon discharge.   CODE STATUS: FULL CODE.   TIME SPENT ON DISCHARGE: 40 minutes.    ____________________________ Belia Heman. Verdell Carmine, MD vjs:np D: 10/20/2012 14:17:16 ET T: 10/20/2012 15:19:52 ET JOB#: [id number]  cc: Belia Heman. Verdell Carmine, MD, <Dictator> [facility] Henreitta Leber MD ELECTRONICALLY SIGNED 11/01/2012  14:37 

## 2014-04-26 NOTE — H&P (Signed)
PATIENT NAME:  Gabrielle Schultz, SPROW MR#:  [id number] DATE OF BIRTH:  April 21, 1970  DATE OF ADMISSION:  10/19/2012  PRIMARY CARE PHYSICIAN: None.  REFERRING PHYSICIAN: Loney Hering, MD  CHIEF COMPLAINT: Chest pain, abdominal pain.   HISTORY OF PRESENT ILLNESS: The patient is a 44 year old Caucasian female with past medical history of hypertension, peripheral vascular disease and no significant history of heart problems, who is presenting to the ER with a chief complaint of chest pain and abdominal pain. Last night, she suddenly started having chest pain associated with tightness, with dizziness, shortness of breath, nausea and diaphoresis. The patient got up in the middle of the night and had to use the bathroom and started sweating and could not breathe. Also complaining of lower abdominal pain and could not control bowel movement and has been having watery diarrhea. Denies any vomiting. No similar complaints in the past. The patient was seen by Dr. Nehemiah Massed several years ago as she has strong family history. Denies any fever. No recent travel. No muscle aches. The patient's troponin is positive at 0.13. WBC count is elevated at 22.3, and a CAT scan of the abdomen and pelvis has revealed colitis. Hospitalist team is called to admit the patient. The patient has received aspirin. Creatinine is 1.81, and BUN is at 19.   PAST MEDICAL HISTORY:  1. GERD.  2. Peripheral vascular disease.  3. Hypertension.  4. Right carpal tunnel syndrome.  PAST SURGICAL HISTORY: C-section.  ALLERGIES: No known drug allergies.   PSYCHOSOCIAL HISTORY: Lives at home with husband. She smokes half-pack a day. Denies alcohol or illicit drug usage.   FAMILY HISTORY: Mother had hyperlipidemia, GERD. Father had hypertension, GERD, heart disease and hyperlipidemia.   HOME MEDICATIONS:  1. Ranitidine 150 mg 2 times a day.  2. Hydrochlorothiazide/lisinopril 25/20 p.o. once daily.  3. Clonidine 0.1 mg 2 times a day.  4.  Aspirin 81 mg once daily.   REVIEW OF SYSTEMS:  CONSTITUTIONAL: Denies any fever, fatigue.  EYES: Denies blurry vision, glaucoma.  ENT: Denies epistaxis, discharge.  RESPIRATION: Denies cough, COPD.  CARDIOVASCULAR: Complaining of chest pain, dizziness, shortness of breath.  GASTROINTESTINAL: Complaining of nausea, diarrhea, lower abdominal pain. Denies any hematemesis or melena.  GENITOURINARY: No dysuria, hematuria.  GYNECOLOGIC AND BREASTS: Denies breast mass or vaginal discharge.  ENDOCRINE: Denies polyuria, nocturia. Denies any hypothyroidism.  HEMATOLOGIC AND LYMPHATIC: Denies anemia, easy bruising or bleeding.  INTEGUMENTARY: No acne, rash, lesions.  MUSCULOSKELETAL: No joint pain in the neck and back. Complaining of lower abdominal pain. NEUROLOGIC: Denies vertigo, ataxia, dementia.  PSYCHIATRIC: No ADD, OCD.   PHYSICAL EXAMINATION:  VITAL SIGNS: Temperature 97.4, pulse 97, respirations 20, blood pressure 98/52, pulse oximetry 99% on 2 liters.  GENERAL APPEARANCE: Not under acute distress. Moderately built and moderately nourished.  HEENT: Normocephalic, atraumatic. Pupils are equally reactive to light and accommodation. No scleral icterus. No conjunctival injection. No sinus tenderness. No postnasal drip. Moist mucous membranes.  NECK: Supple. No JVD. No thyromegaly. Range of motion is intact.  LUNGS: Clear to auscultation bilaterally. No accessory muscle usage. No anterior chest wall tenderness on palpation. No peripheral edema.  CARDIAC: S1, S2 normal. Regular rate and rhythm. No murmurs.  GASTROINTESTINAL: Soft. Bowel sounds are positive in all 4 quadrants. Positive lower abdominal tenderness. No rebound tenderness. No guarding, no rigidity. No hepatosplenomegaly.  NEUROLOGIC: Awake, alert, oriented x3. Motor and sensory grossly intact. Cranial nerves II through XII are intact. Reflexes are 2+.  EXTREMITIES: No edema. No cyanosis.  No clubbing.  SKIN: Warm to touch. Normal  turgor. No rashes. No lesions.  MUSCULOSKELETAL: No joint effusion, tenderness, erythema.  PSYCHIATRIC: Normal mood and affect.   LABORATORY AND IMAGING STUDIES: CAT scan of the abdomen and pelvis without IV contrast has revealed thickened colon wall, inflammation seen surrounding descending colon, compatible with colitis. Some free fluid in the pelvis, but no discrete abscess or free air. LFTs are normal. CK total 57, troponin 0.13, CPK-MB 1.3. CBC: WBC elevated at 22.3. Urinalysis: Pregnancy test is negative, nitrite, leukocyte esterase are negative. Amorphous crystals are present. BMP: BUN is elevated at 19, creatinine 1.81, glucose is 165. The rest of the BMP is normal. A 12-lead EKG has revealed sinus rhythm at 89 beats per minute, left ventricular hypertrophy, prolonged QT, ST depressions in V4 through V6. T wave inversion in aVL. Chest x-ray: No acute findings.   ASSESSMENT AND PLAN: A 44 year old female presenting to the ER with a chief complaint of chest pain associated with shortness of breath, nausea, diaphoresis as well as abdominal pain and diarrhea. Will be admitted with the following assessment and plan.   1. Chest pain with positive troponin and ST depressions, most likely non-ST elevation myocardial infarction. Will keep her n.p.o. ACS protocol including aspirin and statin. The patient will be receiving heparin bolus and heparin drip. Cardiology consult is placed and discussed with Dr. Nehemiah Massed, who might consider doing cardiac catheterization.  The patient will be on beta blocker, aspirin. 2. Acute colitis with elevated white count and lower abdominal pain. Stool tests are ordered, which are pending at this time. Implement enteric precautions. The patient will be on IV levofloxacin and Flagyl.  Pain management with morphine.  3. Chronic history of hypertension. Blood pressure is low-normal at this time. Blood pressure medications will be given with holding parameters.  4. Acid reflux.  Will provide her gastrointestinal prophylaxis.  5. Deep vein thrombosis prophylaxis with heparin drip.   CODE STATUS: She is full code. Her husband is her medical power of attorney.   Diagnosis and plan of care were discussed in detail with the patient. She is aware of the plan.   TOTAL CRITICAL CARE TIME SPENT: 50 minutes.   ____________________________ Nicholes Mango, MD ag:lb D: 10/19/2012 07:37:20 ET T: 10/19/2012 07:56:27 ET JOB#: [id number]  cc: Nicholes Mango, MD, <Dictator> Corey Skains, MD Nicholes Mango MD ELECTRONICALLY SIGNED 11/03/2012 1:44

## 2014-04-27 NOTE — Discharge Summary (Signed)
PATIENT NAME:  Gabrielle Schultz, Gabrielle Schultz MR#:  [id number] DATE OF BIRTH:  12/18/1970  DATE OF ADMISSION:  05/18/2013 DATE OF DISCHARGE:  05/20/2013  DISCHARGE DIAGNOSES: 1.  Colitis.  2.  Acute renal failure.  3.  Hyponatremia.  4.  Peripheral vascular disease.     DISCHARGE MEDICATIONS: 1.  Aspirin 81 mg p.o. daily.  2.  Ranitidine 150 mg p.o. b.i.d.   3.  Clonidine 0.1 mg b.i.d.   4.  Cipro 250 mg every 12 hours for 7 days.  5.  Flagyl 500 mg p.o. b.i.d. for 7 days.  6.  The patient is advised stop lisinopril and also HCTZ combination till she follows up with primary doctor.   CONSULTATIONS: None.   HOSPITAL COURSE:  1.  The patient is a 44 year old female patient admitted on May 15th because of diarrhea. Please look at the history and physical for full details. The patient started to have severe diarrhea associated with abdominal cramping. The patient admitted to the hospitalist service because of gastroenteritis. The patient's stool culture did not show any C. difficile. She was started on Cipro and Flagyl. Her white count on admission was elevated at 16.9. The patient has history of colitis. She was given Cipro and Flagyl, did not have any further nausea,  vomiting or diarrhea or abdominal pain, tolerated the diet and she is stable for discharging home today.  2.  Hypernatremia and acute renal failure. The patient's sodium was 121 and BUN was 21, creatinine 2.05 on admission. Received IV fluids and we stopped the HCTZ and lisinopril. Sodium improved and also renal failure resolved. Sodium today is 132, BUN 16 and creatinine 1.65. The patient does have a history of peripheral vascular disease and stroke and the patient's CT head on admission did not show any stroke. 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 The patient's does have CKD stage. 3 the patient says she follows up with [facility]. I told her to quit smoking as well. The  patient advised to continue with clonidine for blood pressure and aspirin that she was taking before. She can continue Cipro and Flagyl for colitis.   TIME SPENT ON DISCHARGE PREPARATION: More than 30 minutes.   ____________________________ Epifanio Lesches, MD sk:cs D: 05/20/2013 12:26:31 ET T: 05/20/2013 19:03:56 ET JOB#: [id number]  cc: Epifanio Lesches, MD, <Dictator> Epifanio Lesches MD ELECTRONICALLY SIGNED 05/30/2013 15:42

## 2014-04-27 NOTE — H&P (Signed)
PATIENT NAME:  Gabrielle Schultz, Gabrielle Schultz MR#:  [id number] DATE OF BIRTH:  [birth year]  DATE OF ADMISSION:  05/18/2013  PRIMARY CARE PHYSICIAN:  Nonlocal.   PRIMARY CARDIOLOGIST:  Dr. Nehemiah Massed.   REFERRING PHYSICIAN:  Dr. Lenise Arena.   CHIEF COMPLAINT:  Diarrhea.   HISTORY OF PRESENT ILLNESS:  Gabrielle Schultz is a 44 year old female with history of hypertension, peripheral vascular disease, chronic renal insufficiency presented to the Emergency Department with sudden onset of diarrhea.  The patient states was doing well before going to the race track, was watching in the middle of it started to experience discomfort in the abdomen followed by multiple episodes of diarrhea to the point that patient could not move from that area, had multiple episodes of diarrhea.  Concerning the patient is brought to the Emergency Department.  Work-up in the Emergency Department, the patient is found to have elevated BUN and creatinine.  The patient's C. diff. toxin is negative.  Mild hypokalemia.  The patient states at that time had abdominal pain, diaphoresis and shortness of breath.  The patient had mild elevation of the troponin of 0.17.  CT head without contrast did not show any acute intracranial process.  The patient denies having any weakness in any part of the body.   PAST MEDICAL HISTORY: 1.  Hypertension.  2.  Hyperlipidemia.  3.  Peripheral vascular disease.  4.  Chronic renal insufficiency with previous baseline creatinine of 1.16.  5.  Continued tobacco use.  6.  Previous history of CVA.  7.  Gastroesophageal reflux disease.  PAST SURGICAL HISTORY:  C-section.   ALLERGIES:  COCONUT.   HOME MEDICATIONS: 1.  Ranitidine 150 mg 2 times a day.  2.  Lisinopril hydrochlorothiazide 1 tablet once a day.  3.  Clonidine 0.1 mg 2 times a day.  4.  Aspirin 81 mg once a day.   SOCIAL HISTORY:  Continues to smoke 1/2 pack a day.  Denies drinking alcohol or using illicit drugs.  Lives with her husband and son.    FAMILY HISTORY:  History of heart problems.  Mother with hyperlipidemia, gastroesophageal reflux disease.  Father with hypertension, heart disease and hyperlipidemia.   REVIEW OF SYSTEMS: CONSTITUTIONAL:  Experiencing some weakness.  EYES:  No change in vision.  EARS, NOSE, THROAT:  No change in hearing.  RESPIRATORY:  No cough, shortness of breath.  CARDIOVASCULAR:  No chest pain, palpations.  GASTROINTESTINAL:  Has diarrhea and abdominal pain.  GENITOURINARY:  No dysuria or hematuria.  HEMATOLOGIC:  No easy bruising or bleeding.  SKIN:  No rash or lesions.  ENDOCRINE:  No polydipsia or polydipsia.  MUSCULOSKELETAL:  No joint pains and aches.  NEUROLOGIC:  No weakness or numbness in any part of the body.   PHYSICAL EXAMINATION: GENERAL:  This is a well-built, well-nourished, age-appropriate female lying down in the bed, not in distress.  VITAL SIGNS:  Temperature 98.4, pulse 78, blood pressure 112/68, respiratory rate of 18, oxygen saturation is 98% on room air.  HEENT:  Head normocephalic, atraumatic.  Eyes, no scleral icterus.  Conjunctivae normal.  Pupils equal and react to light.  Extraocular movements are intact.  Mucous membranes moist.  No pharyngeal erythema.  NECK:  Supple.  No lymphadenopathy.  No JVD.  No carotid bruit.  CHEST:  Has no focal tenderness.  LUNGS:  Bilaterally to auscultation.  HEART:  S1, S2 regular.  No murmurs are heard.  ABDOMEN:  Bowel sounds plus.  Soft, nontender, nondistended.  No hepatosplenomegaly.  EXTREMITIES:  No pedal edema.  Pulses 2+ throughout.  SKIN:  No rash or lesions.  MUSCULOSKELETAL:  Good range of motion in all the extremities.  NEUROLOGIC:  The patient is alert, oriented to place, person, and time.  Cranial nerves II through XII intact.  Motor 5 by 5 in upper and lower extremities.   LABORATORY DATA:  UA negative for nitrites and leukocyte esterase.  C. diff. negative.  Troponin 0.15, CK-MB 1.5, BUN 21, creatinine of 2.05, potassium  of 3.4.  The rest of all the values are within normal limits.   ASSESSMENT AND PLAN:  Gabrielle Schultz is a 44 year old female who comes to the Emergency Department with sudden onset of diarrhea.  1.  Diarrhea.  This does not seem to be any infectious cause.  The patient had brief period of explosive diarrhea with chest pain resolved by the time the patient came to the Emergency Department.  The patient did not have any bowel movements after coming to the Emergency Department other than small bowel movements.  Considering the patient's peripheral vascular disease, ischemic colitis as the patient's stool occult also is positive; however, we will keep the patient on Cipro and Flagyl even if she has underlying ischemic colitis.  The patient Clostridium difficile is negative.  2.  Acute on chronic renal insufficiency.  Continue with intravenous fluids and follow up.  3.  Hypokalemia:  We will replace by mouth.  This could be from the hydrochlorothiazide.  4.  Hypertension:  We will hold lisinopril and hydrochlorothiazide.  Continue with intravenous fluids.  5.  Continued smoking.  Counseled with the patient for five minutes.  The patient seems to have poor insight.  6.  Keep the patient on deep vein thrombosis prophylaxis with sequential compression devices.   TIME SPENT:  50 minutes.    ____________________________ Monica Becton, MD pv:ea D: 05/18/2013 23:31:36 ET T: 05/18/2013 23:50:39 ET JOB#: [id number]  cc: Monica Becton, MD, <Dictator> Monica Becton MD ELECTRONICALLY SIGNED 06/01/2013 0:40

## 2014-05-16 ENCOUNTER — Ambulatory Visit: Payer: Self-pay

## 2014-06-06 ENCOUNTER — Ambulatory Visit: Payer: Self-pay

## 2014-06-13 ENCOUNTER — Other Ambulatory Visit: Payer: Self-pay

## 2014-06-20 ENCOUNTER — Other Ambulatory Visit: Payer: Self-pay

## 2014-06-25 ENCOUNTER — Inpatient Hospital Stay
Admission: EM | Admit: 2014-06-25 | Discharge: 2014-06-27 | DRG: 394 | Disposition: A | Discharge status: Home / Self Care | Payer: Self-pay | Attending: Specialist | Admitting: Specialist

## 2014-06-25 ENCOUNTER — Emergency Department: Payer: Self-pay

## 2014-06-25 ENCOUNTER — Encounter: Payer: Self-pay | Admitting: Urgent Care

## 2014-06-25 DIAGNOSIS — Z7982 Long term (current) use of aspirin: Secondary | ICD-10-CM

## 2014-06-25 DIAGNOSIS — K635 Polyp of colon: Secondary | ICD-10-CM | POA: Diagnosis present

## 2014-06-25 DIAGNOSIS — K529 Noninfective gastroenteritis and colitis, unspecified: Secondary | ICD-10-CM | POA: Diagnosis present

## 2014-06-25 DIAGNOSIS — F17210 Nicotine dependence, cigarettes, uncomplicated: Secondary | ICD-10-CM | POA: Diagnosis present

## 2014-06-25 DIAGNOSIS — Z8249 Family history of ischemic heart disease and other diseases of the circulatory system: Secondary | ICD-10-CM

## 2014-06-25 DIAGNOSIS — K922 Gastrointestinal hemorrhage, unspecified: Secondary | ICD-10-CM

## 2014-06-25 DIAGNOSIS — K219 Gastro-esophageal reflux disease without esophagitis: Secondary | ICD-10-CM | POA: Diagnosis present

## 2014-06-25 DIAGNOSIS — I959 Hypotension, unspecified: Secondary | ICD-10-CM

## 2014-06-25 DIAGNOSIS — Z8673 Personal history of transient ischemic attack (TIA), and cerebral infarction without residual deficits: Secondary | ICD-10-CM

## 2014-06-25 DIAGNOSIS — I252 Old myocardial infarction: Secondary | ICD-10-CM

## 2014-06-25 DIAGNOSIS — I10 Essential (primary) hypertension: Secondary | ICD-10-CM | POA: Diagnosis present

## 2014-06-25 DIAGNOSIS — D6859 Other primary thrombophilia: Secondary | ICD-10-CM | POA: Diagnosis present

## 2014-06-25 DIAGNOSIS — Z79899 Other long term (current) drug therapy: Secondary | ICD-10-CM

## 2014-06-25 DIAGNOSIS — R1030 Lower abdominal pain, unspecified: Secondary | ICD-10-CM

## 2014-06-25 DIAGNOSIS — K550 Acute vascular disorders of intestine: Principal | ICD-10-CM | POA: Diagnosis present

## 2014-06-25 HISTORY — DX: Noninfective gastroenteritis and colitis, unspecified: K52.9

## 2014-06-25 HISTORY — DX: Acute myocardial infarction, unspecified: I21.9

## 2014-06-25 HISTORY — DX: Cerebral infarction, unspecified: I63.9

## 2014-06-25 LAB — CBC
HEMATOCRIT: 53.4 % — AB (ref 35.0–47.0)
Hemoglobin: 17.2 g/dL — ABNORMAL HIGH (ref 12.0–16.0)
MCH: 31.4 pg (ref 26.0–34.0)
MCHC: 32.2 g/dL (ref 32.0–36.0)
MCV: 97.5 fL (ref 80.0–100.0)
Platelets: 195 10*3/uL (ref 150–440)
RBC: 5.47 MIL/uL — ABNORMAL HIGH (ref 3.80–5.20)
RDW: 13.5 % (ref 11.5–14.5)
WBC: 14.5 10*3/uL — ABNORMAL HIGH (ref 3.6–11.0)

## 2014-06-25 LAB — COMPREHENSIVE METABOLIC PANEL
ALT: 13 U/L — ABNORMAL LOW (ref 14–54)
ANION GAP: 7 (ref 5–15)
AST: 27 U/L (ref 15–41)
Albumin: 3.8 g/dL (ref 3.5–5.0)
Alkaline Phosphatase: 115 U/L (ref 38–126)
BUN: 24 mg/dL — AB (ref 6–20)
CALCIUM: 9.2 mg/dL (ref 8.9–10.3)
CO2: 19 mmol/L — ABNORMAL LOW (ref 22–32)
Chloride: 108 mmol/L (ref 101–111)
Creatinine, Ser: 1.9 mg/dL — ABNORMAL HIGH (ref 0.44–1.00)
GFR calc Af Amer: 37 mL/min — ABNORMAL LOW (ref >60)
GFR calc non Af Amer: 32 mL/min — ABNORMAL LOW (ref >60)
Glucose, Bld: 163 mg/dL — ABNORMAL HIGH (ref 65–99)
Potassium: 3.7 mmol/L (ref 3.5–5.1)
SODIUM: 134 mmol/L — AB (ref 135–145)
TOTAL PROTEIN: 7.5 g/dL (ref 6.5–8.1)
Total Bilirubin: 0.7 mg/dL (ref 0.3–1.2)

## 2014-06-25 LAB — TROPONIN I: Troponin I: 0.04 ng/mL — ABNORMAL HIGH (ref <0.031)

## 2014-06-25 LAB — C DIFFICILE QUICK SCREEN W PCR REFLEX
C DIFFICILE (CDIFF) INTERP: NEGATIVE
C Diff antigen: NEGATIVE
C Diff toxin: NEGATIVE

## 2014-06-25 LAB — LACTIC ACID, PLASMA: LACTIC ACID, VENOUS: 1.9 mmol/L (ref 0.5–2.0)

## 2014-06-25 MED ORDER — ACETAMINOPHEN 650 MG RE SUPP: 650.0000 mg | Freq: Four times a day (QID) | RECTAL | Status: DC | PRN

## 2014-06-25 MED ORDER — NICOTINE 21 MG/24HR TD PT24
21.0000 mg | MEDICATED_PATCH | Freq: Every day | TRANSDERMAL | Status: DC
  Administered 2014-06-25 – 2014-06-27 (×3): 21 mg via TRANSDERMAL
  Filled 2014-06-25 (×3): qty 1

## 2014-06-25 MED ORDER — IOHEXOL 240 MG/ML SOLN
25.0000 mL | INTRAMUSCULAR | Status: AC
  Administered 2014-06-25 (×2): 25 mL via ORAL

## 2014-06-25 MED ORDER — SODIUM CHLORIDE 0.9 % IV BOLUS (SEPSIS)
1000.0000 mL | Freq: Once | INTRAVENOUS | Status: AC
  Administered 2014-06-25: 1000 mL via INTRAVENOUS

## 2014-06-25 MED ORDER — DICYCLOMINE HCL 10 MG/ML IM SOLN
10.0000 mg | Freq: Once | INTRAMUSCULAR | Status: AC
  Administered 2014-06-25: 10 mg via INTRAMUSCULAR

## 2014-06-25 MED ORDER — ONDANSETRON HCL 4 MG/2ML IJ SOLN: 4.0000 mg | Freq: Four times a day (QID) | INTRAMUSCULAR | Status: DC | PRN

## 2014-06-25 MED ORDER — LISINOPRIL 20 MG PO TABS
20.0000 mg | ORAL_TABLET | Freq: Every day | ORAL | Status: DC
  Administered 2014-06-25 – 2014-06-27 (×3): 20 mg via ORAL
  Filled 2014-06-25 (×3): qty 1

## 2014-06-25 MED ORDER — CIPROFLOXACIN IN D5W 400 MG/200ML IV SOLN
400.0000 mg | Freq: Two times a day (BID) | INTRAVENOUS | Status: DC
  Administered 2014-06-25 – 2014-06-26 (×2): 400 mg via INTRAVENOUS
  Filled 2014-06-25 (×5): qty 200

## 2014-06-25 MED ORDER — MORPHINE SULFATE 2 MG/ML IJ SOLN: 2.0000 mg | INTRAMUSCULAR | Status: DC | PRN

## 2014-06-25 MED ORDER — METRONIDAZOLE IN NACL 5-0.79 MG/ML-% IV SOLN
500.0000 mg | Freq: Three times a day (TID) | INTRAVENOUS | Status: DC
  Administered 2014-06-25 – 2014-06-26 (×3): 500 mg via INTRAVENOUS
  Filled 2014-06-25 (×7): qty 100

## 2014-06-25 MED ORDER — AMLODIPINE BESYLATE 5 MG PO TABS
5.0000 mg | ORAL_TABLET | Freq: Every day | ORAL | Status: DC
  Administered 2014-06-25 – 2014-06-27 (×3): 5 mg via ORAL
  Filled 2014-06-25 (×3): qty 1

## 2014-06-25 MED ORDER — POLYETHYLENE GLYCOL 3350 17 GM/SCOOP PO POWD
1.0000 | Freq: Once | ORAL | Status: AC
  Administered 2014-06-25: 255 g via ORAL
  Filled 2014-06-25: qty 255

## 2014-06-25 MED ORDER — IOHEXOL 300 MG/ML  SOLN
100.0000 mL | Freq: Once | INTRAMUSCULAR | Status: AC | PRN
  Administered 2014-06-25: 100 mL via INTRAVENOUS

## 2014-06-25 MED ORDER — CLONIDINE HCL 0.1 MG PO TABS
0.1000 mg | ORAL_TABLET | Freq: Two times a day (BID) | ORAL | Status: DC
  Administered 2014-06-25 – 2014-06-27 (×4): 0.1 mg via ORAL
  Filled 2014-06-25 (×4): qty 1

## 2014-06-25 MED ORDER — DICYCLOMINE HCL 10 MG/ML IM SOLN
INTRAMUSCULAR | Status: AC
  Administered 2014-06-25: 10 mg via INTRAMUSCULAR
  Filled 2014-06-25: qty 2

## 2014-06-25 MED ORDER — HYDROCODONE-ACETAMINOPHEN 5-325 MG PO TABS
1.0000 | ORAL_TABLET | ORAL | Status: DC | PRN
  Administered 2014-06-25: 23:00:00 1 via ORAL
  Filled 2014-06-25: qty 1

## 2014-06-25 MED ORDER — ONDANSETRON HCL 4 MG PO TABS: 4.0000 mg | ORAL_TABLET | Freq: Four times a day (QID) | ORAL | Status: DC | PRN

## 2014-06-25 MED ORDER — SODIUM CHLORIDE 0.9 % IV SOLN: INTRAVENOUS | Status: DC

## 2014-06-25 MED ORDER — FAMOTIDINE 20 MG PO TABS
20.0000 mg | ORAL_TABLET | Freq: Two times a day (BID) | ORAL | Status: DC
  Administered 2014-06-25 – 2014-06-27 (×5): 20 mg via ORAL
  Filled 2014-06-25 (×5): qty 1

## 2014-06-25 MED ORDER — SODIUM CHLORIDE 0.9 % IV SOLN
INTRAVENOUS | Status: DC
  Administered 2014-06-25 – 2014-06-26 (×2): via INTRAVENOUS

## 2014-06-25 MED ORDER — ASPIRIN 81 MG PO CHEW
81.0000 mg | CHEWABLE_TABLET | Freq: Every day | ORAL | Status: DC
  Administered 2014-06-27: 09:00:00 81 mg via ORAL
  Filled 2014-06-25 (×3): qty 1

## 2014-06-25 MED ORDER — ACETAMINOPHEN 325 MG PO TABS: 650.0000 mg | ORAL_TABLET | Freq: Four times a day (QID) | ORAL | Status: DC | PRN

## 2014-06-25 NOTE — Plan of Care (Signed)
Problem: Discharge Progression Outcomes Goal: Complications resolved/controlled Outcome: Progressing Complications are being controlled by rehydrating pt with IV fluids and adjusting diet order per GI's order. Pt verbalized she does not see a outpatient GI doctor and will need to follow up with Dr.Rein outpatient.

## 2014-06-25 NOTE — ED Notes (Signed)
CT to room to provide contrast. Patient reporting coconut allergy as well as a banana and strawberry intolerance. Patient to receive Barium instead. MD made aware. Allergy list updated.

## 2014-06-25 NOTE — ED Provider Notes (Signed)
[facility] Emergency Department Provider Note  ____________________________________________  Time seen: Approximately 450 AM  I have reviewed the triage vital signs and the nursing notes.   HISTORY  Chief Complaint Hypotension; Diarrhea; Weakness; and Abdominal Pain    HPI ANTANASIA DUKES is a 44 y.o. female who was feeling well yesterday but woke up feeling weak and dizzy. The patient felt as though she had to use the bathroom but did not make it there. The patient has a history of heart attack and CVA last year. EMS called with the patient's blood pressure being in the 70s over 40s. The patient felt that her legs were weak and she does have some abdominal pain. The patient reports she had a similar episode when she had colitis in the past. She reports that she feels all over lower abdominal cramping with no sharp specific pain. The patient does have some mild shortness of breath with no nausea or vomiting no headache or blurred vision. Patient does feel dizzy and lightheaded.He reports that her pain as a 9 out of 10 in intensity.   Past Medical History  Diagnosis Date  . History of syncope   . Acid reflux   . Carpal tunnel syndrome     right hand  . Hypertension 2008  . Syncope and collapse   . Poor circulation   . Acid reflux   . Migraines   . Colitis   . MI (myocardial infarction)     2015  . CVA (cerebral infarction)     2009, 2015    Patient Active Problem List   Diagnosis Date Noted  . Hypertension 08/12/2011  . LVH (left ventricular hypertrophy) 08/12/2011  . Smoking addiction 08/12/2011  . Chronic renal insufficiency 08/12/2011    Past Surgical History  Procedure Laterality Date  . Cesarean section  2004    Current Outpatient Rx  Name  Route  Sig  Dispense  Refill  . amLODipine (NORVASC) 5 MG tablet   Oral   Take 5 mg by mouth daily.         Marland Kitchen lisinopril (PRINIVIL,ZESTRIL) 20 MG tablet   Oral   Take 20 mg by mouth daily.          Marland Kitchen aspirin 81 MG tablet   Oral   Take 81 mg by mouth daily.         . cloNIDine (CATAPRES) 0.1 MG tablet   Oral   Take 1 tablet (0.1 mg total) by mouth 2 (two) times daily.   60 tablet   11   . EXPIRED: lisinopril-hydrochlorothiazide (PRINZIDE) 20-12.5 MG per tablet   Oral   Take 1 tablet by mouth daily.   30 tablet   11   . nicotine (NICODERM CQ - DOSED IN MG/24 HOURS) 21 mg/24hr patch   Transdermal   Place 1 patch onto the skin daily.         . ranitidine (ZANTAC) 150 MG tablet   Oral   Take 150 mg by mouth 2 (two) times daily.            Allergies Review of patient's allergies indicates no known allergies.  Family History  Problem Relation Age of Onset  . Hyperlipidemia Mother   . Heart disease Mother   . Hypertension Father   . Heart disease Father   . Hyperlipidemia Father   . Hypertension Sister     Social History History  Substance Use Topics  . Smoking status: Current Every Day Smoker --  0.50 packs/day for 18 years    Types: Cigarettes  . Smokeless tobacco: Not on file  . Alcohol Use: Yes     Comment: occasionally    Review of Systems Constitutional: No fever/chills Eyes: Blurred vision ENT: No sore throat. Cardiovascular: Denies chest pain. Respiratory: Denies shortness of breath. Gastrointestinal: Abdominal pain and diarrhea with no vomiting or nausea Genitourinary: Negative for dysuria. Musculoskeletal: Negative for back pain. Skin: Negative for rash. Neurological: Negative for headaches,   10-point ROS otherwise negative.  ____________________________________________   PHYSICAL EXAM:  VITAL SIGNS: ED Triage Vitals  Enc Vitals Group     BP 06/25/14 0500 82/65 mmHg     Pulse Rate 06/25/14 0500 99     Resp 06/25/14 0500 18     Temp 06/25/14 0500 97.5 F (36.4 C)     Temp Source 06/25/14 0500 Oral     SpO2 06/25/14 0500 99 %     Weight 06/25/14 0500 152 lb (68.947 kg)     Height 06/25/14 0500 5\' 1"  (1.549 m)      Head Cir --      Peak Flow --      Pain Score 06/25/14 0501 9     Pain Loc --      Pain Edu? --      Excl. in [place]? --     Constitutional: Alert and oriented. Pale and ill-appearing in moderate to severe distress Eyes: Conjunctivae are normal. PERRL. EOMI. Head: Atraumatic. Nose: No congestion/rhinnorhea. Mouth/Throat: Mucous membranes are moist.  Oropharynx non-erythematous. Cardiovascular: Normal rate, regular rhythm. Grossly normal heart sounds.  Good peripheral circulation. Respiratory: Normal respiratory effort.  No retractions. Lungs CTAB. Gastrointestinal: Soft with lower abdominal tenderness to palpation and decreased bowel sounds.. Genitourinary: Deferred Musculoskeletal: No lower extremity tenderness nor edema.  Neurologic:  Normal speech and language. No gross focal neurologic deficits are appreciated.  Skin:  Skin is warm, dry and intact. Pallor Psychiatric: Mood and affect are normal.   ____________________________________________   LABS (all labs ordered are listed, but only abnormal results are displayed)  Labs Reviewed  CBC - Abnormal; Notable for the following:    WBC 14.5 (*)    RBC 5.47 (*)    Hemoglobin 17.2 (*)    HCT 53.4 (*)    All other components within normal limits  COMPREHENSIVE METABOLIC PANEL - Abnormal; Notable for the following:    Sodium 134 (*)    CO2 19 (*)    Glucose, Bld 163 (*)    BUN 24 (*)    Creatinine, Ser 1.90 (*)    ALT 13 (*)    GFR calc non Af Amer 32 (*)    GFR calc Af Amer 37 (*)    All other components within normal limits  TROPONIN I - Abnormal; Notable for the following:    Troponin I 0.04 (*)    All other components within normal limits  STOOL CULTURE  C DIFFICILE QUICK SCAN W PCR REFLEX ([facility] ONLY)  LACTIC ACID, PLASMA  LACTIC ACID, PLASMA   ____________________________________________  EKG  ED ECG REPORT I, Loney Hering, the attending physician, personally viewed and interpreted this ECG.   Date:  06/25/2014  EKG Time: 457  Rate: 117  Rhythm: sinus tachycardia  Axis: Normal  Intervals:none  ST&T Change: T wave inversions in leads 1 aVL V4 V5 and V6 with some mild depression in leads 2  ____________________________________________  RADIOLOGY  CT abdomen and pelvis pending ____________________________________________   PROCEDURES  Procedure(s) performed: None  Critical Care performed: Yes, see critical care note(s)   CRITICAL CARE Performed by: Charlesetta Ivory P   Total critical care time: 45 min  Critical care time was exclusive of separately billable procedures and treating other patients.  Critical care was necessary to treat or prevent imminent or life-threatening deterioration.  Critical care was time spent personally by me on the following activities: development of treatment plan with patient and/or surrogate as well as nursing, discussions with consultants, evaluation of patient's response to treatment, examination of patient, obtaining history from patient or surrogate, ordering and performing treatments and interventions, ordering and review of laboratory studies, ordering and review of radiographic studies, pulse oximetry and re-evaluation of patient's condition.   ____________________________________________   INITIAL IMPRESSION / ASSESSMENT AND PLAN / ED COURSE  Pertinent labs & imaging results that were available during my care of the patient were reviewed by me and considered in my medical decision making (see chart for details).  This is a 44 year old female who comes in with abdominal pain hypotension and diarrhea tonight. The patient reports that she has this whenever she has colitis. We will give the patient normal saline 2 L IV 1 we did check some blood work on the patient and the patient has mildly elevated white blood cell count she is hemoconcentrated with an elevated creatinine and we will do a CT scan on the patient to evaluate for  colitis.  ----------------------------------------- 6:28 AM on 06/25/2014 -----------------------------------------  As the patient was having bowel movements the stool turned from brown to bright red blood per rectum. We will continue to monitor the patient's blood pressure as it has improved with the fluid and we will admit the patient to the hospitalist for a GI bleed.  The patient is currently still awaiting her CT scan. The patient's care was signed out to Dr. Joni Fears who will follow the CT and complete the admission to the hospitalist service. I did discuss the case with Dr. Brigitte Pulse who will accept the patient once he received the CT scan results. ____________________________________________   FINAL CLINICAL IMPRESSION(S) / ED DIAGNOSES  Final diagnoses:  Abdominal pain  Hypotension GI bleed       Loney Hering, MD 06/25/14 0740

## 2014-06-25 NOTE — ED Provider Notes (Signed)
-----------------------------------------   10:10 AM on 06/25/2014 -----------------------------------------  Assuming care from Dr. Dahlia Client.  In short, Gabrielle Schultz is a 44 y.o. female with a chief complaint of Hypotension; Diarrhea; Weakness; and Abdominal Pain .  Refer to the original H&P for additional details.   CT abdomen and pelvis reviewed. Diffuse colitis with possible pseudomembranous colitis or inflammatory component. Discussed with hospitalist for admission, will evaluate in the ED  Carrie Mew, MD 06/25/14 1011

## 2014-06-25 NOTE — Consult Note (Signed)
GI Inpatient Consult Note  Reason for Consult: colitis on CT, diarrhea, rectal bleeding.    Attending Requesting Consult:  Sainani  History of Present Illness: Gabrielle Schultz is a 44 y.o. female with PMHx notable for CVA, MI, colitis presenting for evaluation of abdominal pain, rectal bleeding, diarrhea area Mr. Munck reports she was in her usual state of health until 24 hours prior to presentation when she developed the above symptoms. She had a similar presentation she reports about 1 year ago. At that time she reports being diagnosed with colitis based on a CT scan. She's never had an endoscopic workup. She states she was not started on any medications at that time. He did not have any symptoms from that episode 1 year ago until now. Has not had any prior colonoscopies.  She has continued to have loose stools and blood in her stools here in the hospital. She did have a CT scan that showed diffuse colitis with mural edema. Her C. difficile study is negative. She continues to have abdominal pain     There is no family history of GI malignancy or inflammatory bowel disease.  Past Medical History:  Past Medical History  Diagnosis Date  . History of syncope   . Acid reflux   . Carpal tunnel syndrome     right hand  . Hypertension 2008  . Syncope and collapse   . Poor circulation   . Acid reflux   . Migraines   . Colitis   . MI (myocardial infarction)     2015  . CVA (cerebral infarction)     2009, 2015    Problem List: Patient Active Problem List   Diagnosis Date Noted  . Acute colitis 06/25/2014  . Hypertension 08/12/2011  . LVH (left ventricular hypertrophy) 08/12/2011  . Smoking addiction 08/12/2011  . Chronic renal insufficiency 08/12/2011    Past Surgical History: Past Surgical History  Procedure Laterality Date  . Cesarean section  2004    Allergies: Allergies  Allergen Reactions  . Banana   . Coconut Oil   . Strawberry     Home Medications: Prescriptions  prior to admission  Medication Sig Dispense Refill Last Dose  . amLODipine (NORVASC) 5 MG tablet Take 5 mg by mouth daily.   06/24/2014 at Unknown time  . aspirin 81 MG tablet Take 81 mg by mouth daily.   06/24/2014 at Unknown time  . cloNIDine (CATAPRES) 0.1 MG tablet Take 1 tablet (0.1 mg total) by mouth 2 (two) times daily. 60 tablet 11 06/24/2014 at Unknown time  . lisinopril (PRINIVIL,ZESTRIL) 20 MG tablet Take 20 mg by mouth daily.   06/24/2014 at Unknown time  . ranitidine (ZANTAC) 150 MG tablet Take 150 mg by mouth 2 (two) times daily.    06/24/2014 at Unknown time  . lisinopril-hydrochlorothiazide (PRINZIDE) 20-12.5 MG per tablet Take 1 tablet by mouth daily. 30 tablet 11   . nicotine (NICODERM CQ - DOSED IN MG/24 HOURS) 21 mg/24hr patch Place 1 patch onto the skin daily.   Not Taking   Home medication reconciliation was completed with the patient.   Scheduled Inpatient Medications:   . amLODipine  5 mg Oral Daily  . aspirin  81 mg Oral Daily  . ciprofloxacin  400 mg Intravenous Q12H  . cloNIDine  0.1 mg Oral BID  . famotidine  20 mg Oral BID  . lisinopril  20 mg Oral Daily  . metronidazole  500 mg Intravenous Q8H  . polyethylene glycol powder  1 Container Oral Once    Continuous Inpatient Infusions:   . sodium chloride 100 mL/hr at 06/25/14 1436    PRN Inpatient Medications:  acetaminophen **OR** acetaminophen, HYDROcodone-acetaminophen, morphine injection, ondansetron **OR** ondansetron (ZOFRAN) IV  Family History: family history includes Heart disease in her father and mother; Hyperlipidemia in her father and mother; Hypertension in her father and sister.  The patient's family history is negative for inflammatory bowel disorders, GI malignancy, or solid organ transplantation.  Social History:   reports that she has been smoking Cigarettes.  She has a 9 pack-year smoking history. She does not have any smokeless tobacco history on file. She reports that she drinks alcohol.  She reports that she does not use illicit drugs.   Review of Systems: Constitutional: Weight is stable.  Eyes: No changes in vision. ENT: No oral lesions, sore throat.  GI: see HPI.  Heme/Lymph: No easy bruising.  CV: No chest pain.  GU: No hematuria.  Integumentary: No rashes.  Neuro: No headaches.  Psych: No depression/anxiety.  Endocrine: No heat/cold intolerance.  Allergic/Immunologic: No urticaria.  Resp: No cough, SOB.  Musculoskeletal: No joint swelling.    Physical Examination: BP 133/86 mmHg  Pulse 88  Temp(Src) 98.8 F (37.1 C) (Oral)  Resp 20  Ht 5\' 1"  (1.549 m)  Wt 157 lb (71.215 kg)  BMI 29.68 kg/m2  SpO2 100%  LMP 05/20/2014 (Approximate) Gen: NAD, alert and oriented x 4 HEENT: PEERLA, EOMI, Neck: supple, no JVD or thyromegaly Chest: CTA bilaterally, no wheezes, crackles, or other adventitious sounds CV: RRR, no m/g/c/r Abd: +significant diffuse TTP, ND, +BS in all four quadrants; no HSM, guarding, ridigity, or rebound tenderness Ext: no edema, well perfused with 2+ pulses, Skin: no rash or lesions noted Lymph: no LAD  Data: Lab Results  Component Value Date   WBC 14.5* 06/25/2014   HGB 17.2* 06/25/2014   HCT 53.4* 06/25/2014   MCV 97.5 06/25/2014   PLT 195 06/25/2014    Recent Labs Lab 06/25/14 0515  HGB 17.2*   Lab Results  Component Value Date   NA 134* 06/25/2014   K 3.7 06/25/2014   CL 108 06/25/2014   CO2 19* 06/25/2014   BUN 24* 06/25/2014   CREATININE 1.90* 06/25/2014   Lab Results  Component Value Date   ALT 13* 06/25/2014   AST 27 06/25/2014   ALKPHOS 115 06/25/2014   BILITOT 0.7 06/25/2014   No results for input(s): APTT, INR, PTT in the last 168 hours.   Assessment/Plan:  [name] is a 44 y.o. female presenting for evaluation of rectal bleeding and diarrhea. She does have diffuse colitis on CT with mural edema. She continues to have rectal bleeding. She does report a prior history of colitis on previous CT scan. I do  strongly suspect she has inflammatory bowel disease. No previous colonoscopy. I would recommend a colonoscopy to evaluate for IBD and take biopsies to confirm the diagnosis. We will plan for this procedure tomorrow morning. She will need to start on Solu-Medrol if appearance is consistent with inflammatory bowel disease. We will continue to follow up her stool culture although her C. difficile is negative this time.  Recommendations: - Clear liquids today. -MiraLAX prep this evening. - Nothing by mouth after midnight -Colonoscopy in a.m. -Continue to monitor hemoglobin and setting of rectal bleeding -Further recs pending above   Thank you for the consult. Please call with questions or concerns.  REIN, Grace Blight, MD

## 2014-06-25 NOTE — H&P (Signed)
[place] [facility] [place] NAME: Gabrielle Schultz    MR#:  [id number]  DATE OF BIRTH:  29-Mar-1970  DATE OF ADMISSION:  06/25/2014  PRIMARY CARE PHYSICIAN: [facility]   REQUESTING/REFERRING PHYSICIAN: Dr. Brenton Grills  CHIEF COMPLAINT:   Chief Complaint  Patient presents with  . Hypotension  . Diarrhea  . Weakness  . Abdominal Pain    HISTORY OF PRESENT ILLNESS:  Gabrielle Schultz  is a 44 y.o. female with a known history of hypertension, GERD, previous history of colitis who presented to the hospital due to abdominal pain that began last night and woke her up from sleep. The pain is located in the lower part of abdomen nonradiating and sharp in nature. Pain is about 8 out 10 in intensity, and not associated with any fever, nausea, vomiting.  Patient's symptoms progressively got worse and therefore she came to the ER for further evaluation. Patient underwent a CT scan of abdomen pelvis which showed diffuse colitis and therefore hospitalist services were contacted further treatment and evaluation.  PAST MEDICAL HISTORY:   Past Medical History  Diagnosis Date  . History of syncope   . Acid reflux   . Carpal tunnel syndrome     right hand  . Hypertension 2008  . Syncope and collapse   . Poor circulation   . Acid reflux   . Migraines   . Colitis   . MI (myocardial infarction)     2015  . CVA (cerebral infarction)     2009, 2015    PAST SURGICAL HISTORY:   Past Surgical History  Procedure Laterality Date  . Cesarean section  2004    SOCIAL HISTORY:   History  Substance Use Topics  . Smoking status: Current Every Day Smoker -- 0.50 packs/day for 18 years    Types: Cigarettes  . Smokeless tobacco: Not on file  . Alcohol Use: Yes     Comment: occasionally    FAMILY HISTORY:   Family History  Problem Relation Age of Onset  . Hyperlipidemia Mother   . Heart disease Mother   . Hypertension Father   . Heart disease  Father   . Hyperlipidemia Father   . Hypertension Sister     DRUG ALLERGIES:   Allergies  Allergen Reactions  . Banana   . Coconut Oil   . Strawberry     REVIEW OF SYSTEMS:   Review of Systems  Constitutional: Negative for fever and weight loss.  HENT: Negative for congestion, nosebleeds and tinnitus.   Eyes: Negative for blurred vision, double vision and redness.  Respiratory: Negative for cough, hemoptysis and shortness of breath.   Cardiovascular: Negative for chest pain, orthopnea, leg swelling and PND.  Gastrointestinal: Positive for abdominal pain. Negative for nausea, vomiting, diarrhea and melena.  Genitourinary: Negative for dysuria, urgency and hematuria.  Musculoskeletal: Negative for joint pain and falls.  Skin: Negative for rash.  Neurological: Negative for dizziness, tingling, sensory change, focal weakness, seizures, weakness and headaches.  Endo/Heme/Allergies: Negative for polydipsia. Does not bruise/bleed easily.  Psychiatric/Behavioral: Negative for depression and memory loss. The patient is not nervous/anxious.     MEDICATIONS AT HOME:   Prior to Admission medications   Medication Sig Start Date End Date Taking? Authorizing Provider  amLODipine (NORVASC) 5 MG tablet Take 5 mg by mouth daily.   Yes Historical Provider, MD  aspirin 81 MG tablet Take 81 mg by mouth daily.   Yes Historical Provider, MD  cloNIDine (CATAPRES) 0.1 MG tablet Take 1 tablet (0.1 mg total) by mouth 2 (two) times daily. 08/12/11  Yes [name], MD  lisinopril (PRINIVIL,ZESTRIL) 20 MG tablet Take 20 mg by mouth daily.   Yes Historical Provider, MD  ranitidine (ZANTAC) 150 MG tablet Take 150 mg by mouth 2 (two) times daily.    Yes Historical Provider, MD  lisinopril-hydrochlorothiazide (PRINZIDE) 20-12.5 MG per tablet Take 1 tablet by mouth daily. 08/12/11 08/11/12  [name], MD  nicotine (NICODERM CQ - DOSED IN MG/24 HOURS) 21 mg/24hr patch Place 1 patch onto the skin daily.     Historical Provider, MD      VITAL SIGNS:  Blood pressure 97/62, pulse 99, temperature 97.5 F (36.4 C), temperature source Oral, resp. rate 18, height 5\' 1"  (1.549 m), weight 68.947 kg (152 lb), last menstrual period 05/20/2014, SpO2 99 %.  PHYSICAL EXAMINATION:  Physical Exam  GENERAL:  44 y.o.-year-old patient lying in the bed with no acute distress.  EYES: Pupils equal, round, reactive to light and accommodation. No scleral icterus. Extraocular muscles intact.  HEENT: Head atraumatic, normocephalic. Oropharynx and nasopharynx clear. No oropharyngeal erythema, moist oral mucosa  NECK:  Supple, no jugular venous distention. No thyroid enlargement, no tenderness.  LUNGS: Normal breath sounds bilaterally, no wheezing, rales, rhonchi. No use of accessory muscles of respiration.  CARDIOVASCULAR: S1, S2 normal. No murmurs, rubs, or gallops.  ABDOMEN: Soft, tenderness lower abdomen, no rebound or rigidity, nondistended. Bowel sounds present. No organomegaly or mass.  EXTREMITIES: No pedal edema, cyanosis, or clubbing. + 2 pedal & radial pulses b/l.   NEUROLOGIC: Cranial nerves II through XII are intact. No focal Motor or sensory deficits appreciated b/l PSYCHIATRIC: The patient is alert and oriented x 3. Good affect.  SKIN: No obvious rash, lesion, or ulcer.   LABORATORY PANEL:   CBC  Recent Labs Lab 06/25/14 0515  WBC 14.5*  HGB 17.2*  HCT 53.4*  PLT 195   ------------------------------------------------------------------------------------------------------------------  Chemistries   Recent Labs Lab 06/25/14 0515  NA 134*  K 3.7  CL 108  CO2 19*  GLUCOSE 163*  BUN 24*  CREATININE 1.90*  CALCIUM 9.2  AST 27  ALT 13*  ALKPHOS 115  BILITOT 0.7   ------------------------------------------------------------------------------------------------------------------  Cardiac Enzymes  Recent Labs Lab 06/25/14 0515  TROPONINI 0.04*    ------------------------------------------------------------------------------------------------------------------  RADIOLOGY:  Ct Abdomen Pelvis W Contrast  06/25/2014   CLINICAL DATA:  44 year old female with abdominal pain, hypotension, diarrhea, weakness. Initial encounter.  EXAM: CT ABDOMEN AND PELVIS WITH CONTRAST  TECHNIQUE: Multidetector CT imaging of the abdomen and pelvis was performed using the standard protocol following bolus administration of intravenous contrast.  CONTRAST:  15mL OMNIPAQUE IOHEXOL 300 MG/ML  SOLN  COMPARISON:  Noncontrast CT Abdomen and Pelvis 10/19/2012  FINDINGS: Stable cardiomegaly. No pericardial or pleural effusion. Negative lung bases, chronic scar or atelectasis in the right middle lobe.  No acute osseous abnormality identified.  Small volume free fluid in the pelvis as well as the lower small bowel mesentery a. fluid in the rectum. Decompressed sigmoid colon with suggestion of some mucosal hyper enhancement. Diminutive urinary bladder. Negative uterus and adnexa.  Indistinct appearance of the descending colon with wall thickening and mesenteric stranding. Mucosal hyper enhancement. The abnormal appearance continues into the transverse colon. The hepatic flexure is involved to a lesser extent, with indistinct wall thickening and mucosal hyper enhancement. The ascending colon also is affected. The appendix remains within normal limits. No dilated small bowel,  however, there is some distal small bowel mucosal hyper enhancement. The stomach is distended with oral contrast, questionable gastric antral thickening. The duodenum bulb appears to remain normal. The remaining duodenum is decompressed.  Liver, gallbladder, spleen, pancreas and adrenal glands are within normal limits. Portal venous system is patent. Aortoiliac calcified atherosclerosis noted. Major arterial structures are patent. No abdominal free fluid or free air. No lymphadenopathy. Renal cortical scarring  intermittently noted bilaterally. Renal contrast enhancement and contrast excretion otherwise appears symmetric.  IMPRESSION: 1. Diffuse colitis. Mural edema suspicious for pseudomembranous colitis. Small volume free fluid, no pneumoperitoneum. 2. There is also evidence of distal small bowel mucosal hyper enhancement also suggesting infectious/inflammatory involvement. 3. Age advanced aortoiliac soft and calcified atherosclerotic plaque. Chronic renal cortical scarring.   Electronically Signed   By: Genevie Ann M.D.   On: 06/25/2014 09:41     IMPRESSION AND PLAN:   44 year old female with past medical history of hypertension, GERD, history of colitis who presents to the hospital with abdominal pain and CT scan findings suggestive of acute colitis.   #1 acute colitis-this is likely the cause of patient's abdominal pain. Patient has not been on antibiotics recently and she is clinically not having any diarrhea presently. -We'll empirically start on IV ciprofloxacin and Flagyl. Get a Gastroenterology consult, continue supportive care with IV fluids, antiemetics, pain control with IV morphine. -Patient has not had a colonoscopy done in the past and would benefit from one. - Check stool for comprehensive culture, C. difficile if patient has diarrhea.    #2 leukocytosis-this is likely secondary to acute colitis and we'll follow white cell count with IV antibacterial therapy  #3 hypertension/essential-hemodynamically stable. -Continue lisinopril, clonidine  #4 GERD-continue Pepcid.   All the records are reviewed and case discussed with ED provider. Management plans discussed with the patient, family and they are in agreement.  CODE STATUS: Full  TOTAL TIME TAKING CARE OF THIS PATIENT: 45 minutes.    Henreitta Leber M.D on 06/25/2014 at 10:50 AM  Between 7am to 6pm - Pager - [phone]  After 6pm go to [email or web address] - password [email or web address] [facility]  [facility] [place] [facility] [facility]  Office   [phone]  CC: Primary care physician; [facility].

## 2014-06-25 NOTE — ED Notes (Signed)
CT with a change in plans. No Barium to be given. Patient to receive the oral contrast mixed in another medium (lemonade crystal light). MD made aware.

## 2014-06-25 NOTE — ED Notes (Signed)
Patient presents to ED via EMS from home. Patient OOB to bathroom tonight - felt weak and dizzy and had to sit in the floor. Patient incontinent of bowels en route to ED. EMS reporting HYPOtension; BP 78/46; Tachy to the 110s. Patient reporting diffuse abd pain.

## 2014-06-25 NOTE — ED Notes (Signed)
Patient assisted OOB to in room toilet. Bed with large amount of bloody diarrhea noted. Brief removed and it was filled with blood. Patient sat on toilet and had additional bloody diarrhea episode. Patient c/o abd spasms; MD made aware and order received for Bentyl 10mg  IM - medication has already been given. Will continue to monitor.

## 2014-06-25 NOTE — Plan of Care (Signed)
Problem: Discharge Progression Outcomes Goal: Discharge plan in place and appropriate Outcome: Progressing Admitted 06/25/14 from ER with colitis flare Clear liquid diet Pending stool culture

## 2014-06-26 ENCOUNTER — Inpatient Hospital Stay: Payer: No Typology Code available for payment source | Admitting: Anesthesiology

## 2014-06-26 ENCOUNTER — Encounter
Admission: EM | Disposition: A | Discharge status: Home / Self Care | Payer: Self-pay | Source: Home / Self Care | Attending: Specialist

## 2014-06-26 HISTORY — PX: COLONOSCOPY WITH PROPOFOL: SHX5780

## 2014-06-26 LAB — BASIC METABOLIC PANEL
ANION GAP: 3 — AB (ref 5–15)
BUN: 18 mg/dL (ref 6–20)
CALCIUM: 8.2 mg/dL — AB (ref 8.9–10.3)
CO2: 20 mmol/L — AB (ref 22–32)
Chloride: 114 mmol/L — ABNORMAL HIGH (ref 101–111)
Creatinine, Ser: 1.69 mg/dL — ABNORMAL HIGH (ref 0.44–1.00)
GFR calc Af Amer: 42 mL/min — ABNORMAL LOW (ref >60)
GFR, EST NON AFRICAN AMERICAN: 37 mL/min — AB (ref >60)
Glucose, Bld: 93 mg/dL (ref 65–99)
POTASSIUM: 4.5 mmol/L (ref 3.5–5.1)
SODIUM: 137 mmol/L (ref 135–145)

## 2014-06-26 LAB — CBC
HCT: 31.9 % — ABNORMAL LOW (ref 35.0–47.0)
HEMOGLOBIN: 10.5 g/dL — AB (ref 12.0–16.0)
MCH: 32.2 pg (ref 26.0–34.0)
MCHC: 32.9 g/dL (ref 32.0–36.0)
MCV: 98.1 fL (ref 80.0–100.0)
Platelets: 118 10*3/uL — ABNORMAL LOW (ref 150–440)
RBC: 3.26 MIL/uL — ABNORMAL LOW (ref 3.80–5.20)
RDW: 13.5 % (ref 11.5–14.5)
WBC: 9.7 10*3/uL (ref 3.6–11.0)

## 2014-06-26 SURGERY — COLONOSCOPY WITH PROPOFOL
Anesthesia: General

## 2014-06-26 MED ORDER — PROPOFOL INFUSION 10 MG/ML OPTIME
INTRAVENOUS | Status: DC | PRN
  Administered 2014-06-26: 100 ug/kg/min via INTRAVENOUS

## 2014-06-26 MED ORDER — PROPOFOL 10 MG/ML IV BOLUS
INTRAVENOUS | Status: DC | PRN
  Administered 2014-06-26: 20 mg via INTRAVENOUS
  Administered 2014-06-26: 80 mg via INTRAVENOUS

## 2014-06-26 MED ORDER — LIDOCAINE HCL (CARDIAC) 20 MG/ML IV SOLN
INTRAVENOUS | Status: DC | PRN
  Administered 2014-06-26: 100 mg via INTRAVENOUS

## 2014-06-26 NOTE — Progress Notes (Signed)
   06/26/14 1300  Clinical Encounter Type  Visited With Patient  Visit Type Initial  Consult/Referral To Chaplain  Visited with patient.  Pt told me she is doing ok.  She told me she was hungry and had not been able to eat since yesterday.  The nurse was present as the patient told me this, and the nurse said she would check and see when the tray would be brought.  Pt thanked me for coming to visit with her.  [place] [phone]

## 2014-06-26 NOTE — Plan of Care (Signed)
Problem: Discharge Progression Outcomes Goal: Other Discharge Outcomes/Goals Outcome: Progressing Plan of care progress to goal for colitis: Pain well controlled w/ Norco given x1 overnight. VSS. Pt currently declines abdominal discomfort. Diet NPO. Pt ambulates w/ standby assist.

## 2014-06-26 NOTE — Progress Notes (Signed)
Patients BP 104/42. Dr. Redmond Pulling informed, no new orders.

## 2014-06-26 NOTE — Plan of Care (Addendum)
Problem: Discharge Progression Outcomes Goal: Other Discharge Outcomes/Goals Outcome: Progressing Patient had colonoscopy today showed Ischemic colitis.  Blood work completed to r/o clotting factor issue, iv fluids and iv antibiotics discontinued.  Diet advanced to liquid diet and tolerated well.  Possible discharge home tomorrow.

## 2014-06-26 NOTE — Progress Notes (Signed)
The patient was seen this afternoon.  Hypercoagulation workup has been ordered.  So detailed consult would be dictated when all the reports are available. If patient is discharged then we can make arrangements to be seen as outpatient and follow up on all the hypercoagulable workup.  I will follow-up this patient if needed in hospital or outpatient.

## 2014-06-26 NOTE — Transfer of Care (Signed)
Immediate Anesthesia Transfer of Care Note  Patient: Gabrielle Schultz  Procedure(s) Performed: Procedure(s): COLONOSCOPY WITH PROPOFOL (N/A)  Patient Location: PACU  Anesthesia Type:General  Level of Consciousness: awake  Airway & Oxygen Therapy: Patient Spontanous Breathing and Patient connected to nasal cannula oxygen  Post-op Assessment: Report given to RN and Post -op Vital signs reviewed and stable  Post vital signs: stable  Last Vitals:  Filed Vitals:   06/26/14 1154  BP: 134/83  Pulse: 83  Temp: 35.9 C  Resp: 12    Complications: No apparent anesthesia complications

## 2014-06-26 NOTE — Op Note (Signed)
[address] [facility] Gastroenterology Patient Name: Gabrielle Schultz Procedure Date: 06/26/2014 11:03 AM MRN: [id number] Account #: [id number] Date of Birth: 1970-10-20 Admit Type: Inpatient Age: 44 Room: [facility] ENDO ROOM 1 Gender: Female Note Status: Finalized Procedure:         Colonoscopy Indications:       Clinically significant diarrhea of unexplained origin,                     Rectal bleeding, Abnormal CT of GI tract. Patient Profile:   This is a 43 year old female. Providers:         Gerrit Heck. Rayann Heman, MD Referring MD:      - -. [facility], MD (Referring MD) Medicines:         Propofol per Anesthesia Complications:     No immediate complications. Procedure:         Pre-Anesthesia Assessment:                    - Prior to the procedure, a History and Physical was                     performed, and patient medications, allergies and                     sensitivities were reviewed. The patient's tolerance of                     previous anesthesia was reviewed.                    After obtaining informed consent, the colonoscope was                     passed under direct vision. Throughout the procedure, the                     patient's blood pressure, pulse, and oxygen saturations                     were monitored continuously. The Colonoscope was                     introduced through the anus and advanced to the the                     terminal ileum. The colonoscopy was performed without                     difficulty. The patient tolerated the procedure well. The                     quality of the bowel preparation was good. Findings:      The terminal ileum appeared normal.      The perianal and digital rectal examinations were normal.      A 15 mm polyp was found in the proximal ascending colon. The polyp was       flat. The polyp was removed with a saline injection-lift technique using       a hot snare. Resection and retrieval were complete. One  hemostatic clip       was successfully placed. This was done to prevent bleeding.      A 8 mm polyp was found in the proximal ascending colon. The polyp was  flat. The polyp was removed with a hot snare. Resection and retrieval       were complete. Two hemostatic clips were successfully placed. This was       done to prevent bleeding.      Segmental moderate inflammation characterized by altered vascularity,       erythema, friability and mucus was found in the sigmoid colon and in the       descending colon from 45 to 15 cm. Rectum spared. Biopsies were taken       with a cold forceps for histology.      Multiple biopsies were obtained with cold forceps for histology randomly       in the proximal transverse colon, at the hepatic flexure and in the       ascending colon.      Four biopsies were obtained with cold forceps for histology randomly in       the rectum.      The exam was otherwise without abnormality. Impression:        - One 15 mm polyp in the proximal ascending colon.                     Resected and retrieved. Clip was placed.                    - One 8 mm polyp in the proximal ascending colon. Resected                     and retrieved. Clips were placed.                    - Segmental moderate inflammation was found in the sigmoid                     colon and in the descending colon from 45 cm to 15 cm.                     Rectum spared. Biopsied.                    - The examination was otherwise normal.                    - Multiple biopsies were obtained in the proximal                     transverse colon, at the hepatic flexure and in the                     ascending colon.                    - Four biopsies were obtained in the rectum from normal                     mucosa.                    - Suspect this is ischemic colitis and not Crohn's or                     Ulcerative colitis. Recommendation:    - Observe patient in GI recovery unit.                     - Continue present medications.                    -  Await pathology results.                    - Hematology consult regarding recurrent ischemic colitis,                     ? hypercoag workup.                    - Repeat colonoscopy for surveillance based on pathology                     results.                    - The findings and recommendations were discussed with the                     patient. Procedure Code(s): --- Professional ---                    [phone], 92, Colonoscopy, flexible; with endoscopic mucosal                     resection                    [phone], Colonoscopy, flexible; with removal of tumor(s),                     polyp(s), or other lesion(s) by snare technique                    [id number], 45, Colonoscopy, flexible; with biopsy, single or                     multiple CPT copyright 2014 [employer] Association. All rights reserved. The codes documented in this report are preliminary and upon coder review may  be revised to meet current compliance requirements. Mellody Life, MD 06/26/2014 11:57:11 AM This report has been signed electronically. Number of Addenda: 0 Note Initiated On: 06/26/2014 11:03 AM Scope Withdrawal Time: 0 hours 25 minutes 31 seconds  Total Procedure Duration: 0 hours 39 minutes 53 seconds       [facility]

## 2014-06-26 NOTE — Progress Notes (Signed)
Gabrielle Schultz NAME: Gabrielle Schultz    MR#:  [id number]  DATE OF BIRTH:  Jun 12, 1970  SUBJECTIVE:  CHIEF COMPLAINT:   Chief Complaint  Patient presents with  . Hypotension  . Diarrhea  . Weakness  . Abdominal Pain   Abdominal pain much improved, no nausea no vomiting. Status post colonoscopy showing signs of possible ischemic colitis. Patient had polyps that were also removed.  REVIEW OF SYSTEMS:    Review of Systems  Constitutional: Negative for fever and chills.  HENT: Negative for congestion and tinnitus.   Eyes: Negative for blurred vision and double vision.  Respiratory: Negative for cough, shortness of breath and wheezing.   Cardiovascular: Negative for chest pain, orthopnea and PND.  Gastrointestinal: Positive for abdominal pain (much improved). Negative for nausea, vomiting and diarrhea.  Genitourinary: Negative for dysuria and hematuria.  Neurological: Negative for dizziness, sensory change, focal weakness and headaches.  All other systems reviewed and are negative.   Nutrition: Full liquid Tolerating Diet: Yes   DRUG ALLERGIES:   Allergies  Allergen Reactions  . Banana   . Coconut Oil   . Strawberry     VITALS:  Blood pressure 138/61, pulse 50, temperature 98 F (36.7 C), temperature source Oral, resp. rate 9, height 5\' 1"  (1.549 m), weight 71.215 kg (157 lb), last menstrual period 05/20/2014, SpO2 100 %.  PHYSICAL EXAMINATION:   Physical Exam  GENERAL:  44 y.o.-year-old patient lying in the bed with no acute distress.  EYES: Pupils equal, round, reactive to light and accommodation. No scleral icterus. Extraocular muscles intact.  HEENT: Head atraumatic, normocephalic. Oropharynx and nasopharynx clear.  NECK:  Supple, no jugular venous distention. No thyroid enlargement, no tenderness.  LUNGS: Normal breath sounds bilaterally, no wheezing, rales, rhonchi. No use of accessory muscles of  respiration.  CARDIOVASCULAR: S1, S2 normal. No murmurs, rubs, or gallops.  ABDOMEN: Soft, nontender, nondistended. Bowel sounds present. No organomegaly or mass.  EXTREMITIES: No cyanosis, clubbing or edema b/l.    NEUROLOGIC: Cranial nerves II through XII are intact. No focal Motor or sensory deficits b/l.   PSYCHIATRIC: The patient is alert and oriented x 3.  Good affect SKIN: No obvious rash, lesion, or ulcer.    LABORATORY PANEL:   CBC  Recent Labs Lab 06/26/14 0433  WBC 9.7  HGB 10.5*  HCT 31.9*  PLT 118*   ------------------------------------------------------------------------------------------------------------------  Chemistries   Recent Labs Lab 06/25/14 0515 06/26/14 0433  NA 134* 137  K 3.7 4.5  CL 108 114*  CO2 19* 20*  GLUCOSE 163* 93  BUN 24* 18  CREATININE 1.90* 1.69*  CALCIUM 9.2 8.2*  AST 27  --   ALT 13*  --   ALKPHOS 115  --   BILITOT 0.7  --    ------------------------------------------------------------------------------------------------------------------  Cardiac Enzymes  Recent Labs Lab 06/25/14 0515  TROPONINI 0.04*   ------------------------------------------------------------------------------------------------------------------  RADIOLOGY:  Ct Abdomen Pelvis W Contrast  06/25/2014   CLINICAL DATA:  44 year old female with abdominal pain, hypotension, diarrhea, weakness. Initial encounter.  EXAM: CT ABDOMEN AND PELVIS WITH CONTRAST  TECHNIQUE: Multidetector CT imaging of the abdomen and pelvis was performed using the standard protocol following bolus administration of intravenous contrast.  CONTRAST:  150mL OMNIPAQUE IOHEXOL 300 MG/ML  SOLN  COMPARISON:  Noncontrast CT Abdomen and Pelvis 10/19/2012  FINDINGS: Stable cardiomegaly. No pericardial or pleural effusion. Negative lung bases, chronic scar or atelectasis in the right middle lobe.  No acute  osseous abnormality identified.  Small volume free fluid in the pelvis as well as the  lower small bowel mesentery a. fluid in the rectum. Decompressed sigmoid colon with suggestion of some mucosal hyper enhancement. Diminutive urinary bladder. Negative uterus and adnexa.  Indistinct appearance of the descending colon with wall thickening and mesenteric stranding. Mucosal hyper enhancement. The abnormal appearance continues into the transverse colon. The hepatic flexure is involved to a lesser extent, with indistinct wall thickening and mucosal hyper enhancement. The ascending colon also is affected. The appendix remains within normal limits. No dilated small bowel, however, there is some distal small bowel mucosal hyper enhancement. The stomach is distended with oral contrast, questionable gastric antral thickening. The duodenum bulb appears to remain normal. The remaining duodenum is decompressed.  Liver, gallbladder, spleen, pancreas and adrenal glands are within normal limits. Portal venous system is patent. Aortoiliac calcified atherosclerosis noted. Major arterial structures are patent. No abdominal free fluid or free air. No lymphadenopathy. Renal cortical scarring intermittently noted bilaterally. Renal contrast enhancement and contrast excretion otherwise appears symmetric.  IMPRESSION: 1. Diffuse colitis. Mural edema suspicious for pseudomembranous colitis. Small volume free fluid, no pneumoperitoneum. 2. There is also evidence of distal small bowel mucosal hyper enhancement also suggesting infectious/inflammatory involvement. 3. Age advanced aortoiliac soft and calcified atherosclerotic plaque. Chronic renal cortical scarring.   Electronically Signed   By: Genevie Ann M.D.   On: 06/25/2014 09:41     ASSESSMENT AND PLAN:  44 year old female with past medical history of hypertension, GERD, history of colitis who presents to the hospital with abdominal pain and CT scan findings suggestive of acute colitis.  #1 acute colitis-this is likely the cause of patient's abdominal pain.  -Much  improved abdominal pain today resolved. Clinically has no nausea, vomiting. -Status post colonoscopy showing polyps and possible evidence of ischemic colitis. -Stool for C. difficile is negative. We'll DC IV antibiotics today. -We'll get hematology consult and get thrombophilia workup for possible hypercoagulable state. -Continue supportive care with IV fluids, antiemetics, pain control for now.  #2 leukocytosis-this is likely secondary to acute colitis and has not improved. -No evidence of infectious colitis therefore we'll DC IV antibiotics.  #3 hypertension/essential-hemodynamically stable. -Continue lisinopril, clonidine  #4 GERD-continue Pepcid.  All the records are reviewed and case discussed with Care Management/Social Workerr. Management plans discussed with the patient, family and they are in agreement.  CODE STATUS: Full  DVT Prophylaxis: Ambulatory  TOTAL TIME TAKING CARE OF THIS PATIENT: 30 minutes.   POSSIBLE D/C tomorrow morning if tolerating by mouth well without any further abdominal pain nausea vomiting.  Henreitta Leber M.D on 06/26/2014 at 2:14 PM  Between 7am to 6pm - Pager - [phone]  After 6pm go to [email or web address] - password [email or web address] [facility]  [facility]  Office  [phone]  CC: Primary care physician; No primary care provider on file.

## 2014-06-26 NOTE — H&P (View-Only) (Signed)
GI Inpatient Consult Note  Reason for Consult: colitis on CT, diarrhea, rectal bleeding.    Attending Requesting Consult:  Sainani  History of Present Illness: Gabrielle Schultz is a 44 y.o. female with PMHx notable for CVA, MI, colitis presenting for evaluation of abdominal pain, rectal bleeding, diarrhea area Gabrielle Schultz reports she was in her usual state of health until 24 hours prior to presentation when she developed the above symptoms. She had a similar presentation she reports about 1 year ago. At that time she reports being diagnosed with colitis based on a CT scan. She's never had an endoscopic workup. She states she was not started on any medications at that time. He did not have any symptoms from that episode 1 year ago until now. Has not had any prior colonoscopies.  She has continued to have loose stools and blood in her stools here in the hospital. She did have a CT scan that showed diffuse colitis with mural edema. Her C. difficile study is negative. She continues to have abdominal pain     There is no family history of GI malignancy or inflammatory bowel disease.  Past Medical History:  Past Medical History  Diagnosis Date  . History of syncope   . Acid reflux   . Carpal tunnel syndrome     right hand  . Hypertension 2008  . Syncope and collapse   . Poor circulation   . Acid reflux   . Migraines   . Colitis   . MI (myocardial infarction)     2015  . CVA (cerebral infarction)     2009, 2015    Problem List: Patient Active Problem List   Diagnosis Date Noted  . Acute colitis 06/25/2014  . Hypertension 08/12/2011  . LVH (left ventricular hypertrophy) 08/12/2011  . Smoking addiction 08/12/2011  . Chronic renal insufficiency 08/12/2011    Past Surgical History: Past Surgical History  Procedure Laterality Date  . Cesarean section  2004    Allergies: Allergies  Allergen Reactions  . Banana   . Coconut Oil   . Strawberry     Home Medications: Prescriptions  prior to admission  Medication Sig Dispense Refill Last Dose  . amLODipine (NORVASC) 5 MG tablet Take 5 mg by mouth daily.   06/24/2014 at Unknown time  . aspirin 81 MG tablet Take 81 mg by mouth daily.   06/24/2014 at Unknown time  . cloNIDine (CATAPRES) 0.1 MG tablet Take 1 tablet (0.1 mg total) by mouth 2 (two) times daily. 60 tablet 11 06/24/2014 at Unknown time  . lisinopril (PRINIVIL,ZESTRIL) 20 MG tablet Take 20 mg by mouth daily.   06/24/2014 at Unknown time  . ranitidine (ZANTAC) 150 MG tablet Take 150 mg by mouth 2 (two) times daily.    06/24/2014 at Unknown time  . lisinopril-hydrochlorothiazide (PRINZIDE) 20-12.5 MG per tablet Take 1 tablet by mouth daily. 30 tablet 11   . nicotine (NICODERM CQ - DOSED IN MG/24 HOURS) 21 mg/24hr patch Place 1 patch onto the skin daily.   Not Taking   Home medication reconciliation was completed with the patient.   Scheduled Inpatient Medications:   . amLODipine  5 mg Oral Daily  . aspirin  81 mg Oral Daily  . ciprofloxacin  400 mg Intravenous Q12H  . cloNIDine  0.1 mg Oral BID  . famotidine  20 mg Oral BID  . lisinopril  20 mg Oral Daily  . metronidazole  500 mg Intravenous Q8H  . polyethylene glycol powder  1 Container Oral Once    Continuous Inpatient Infusions:   . sodium chloride 100 mL/hr at 06/25/14 1436    PRN Inpatient Medications:  acetaminophen **OR** acetaminophen, HYDROcodone-acetaminophen, morphine injection, ondansetron **OR** ondansetron (ZOFRAN) IV  Family History: family history includes Heart disease in her father and mother; Hyperlipidemia in her father and mother; Hypertension in her father and sister.  The patient's family history is negative for inflammatory bowel disorders, GI malignancy, or solid organ transplantation.  Social History:   reports that she has been smoking Cigarettes.  She has a 9 pack-year smoking history. She does not have any smokeless tobacco history on file. She reports that she drinks alcohol.  She reports that she does not use illicit drugs.   Review of Systems: Constitutional: Weight is stable.  Eyes: No changes in vision. ENT: No oral lesions, sore throat.  GI: see HPI.  Heme/Lymph: No easy bruising.  CV: No chest pain.  GU: No hematuria.  Integumentary: No rashes.  Neuro: No headaches.  Psych: No depression/anxiety.  Endocrine: No heat/cold intolerance.  Allergic/Immunologic: No urticaria.  Resp: No cough, SOB.  Musculoskeletal: No joint swelling.    Physical Examination: BP 133/86 mmHg  Pulse 88  Temp(Src) 98.8 F (37.1 C) (Oral)  Resp 20  Ht 5\' 1"  (1.549 m)  Wt 157 lb (71.215 kg)  BMI 29.68 kg/m2  SpO2 100%  LMP 05/20/2014 (Approximate) Gen: NAD, alert and oriented x 4 HEENT: PEERLA, EOMI, Neck: supple, no JVD or thyromegaly Chest: CTA bilaterally, no wheezes, crackles, or other adventitious sounds CV: RRR, no m/g/c/r Abd: +significant diffuse TTP, ND, +BS in all four quadrants; no HSM, guarding, ridigity, or rebound tenderness Ext: no edema, well perfused with 2+ pulses, Skin: no rash or lesions noted Lymph: no LAD  Data: Lab Results  Component Value Date   WBC 14.5* 06/25/2014   HGB 17.2* 06/25/2014   HCT 53.4* 06/25/2014   MCV 97.5 06/25/2014   PLT 195 06/25/2014    Recent Labs Lab 06/25/14 0515  HGB 17.2*   Lab Results  Component Value Date   NA 134* 06/25/2014   K 3.7 06/25/2014   CL 108 06/25/2014   CO2 19* 06/25/2014   BUN 24* 06/25/2014   CREATININE 1.90* 06/25/2014   Lab Results  Component Value Date   ALT 13* 06/25/2014   AST 27 06/25/2014   ALKPHOS 115 06/25/2014   BILITOT 0.7 06/25/2014   No results for input(s): APTT, INR, PTT in the last 168 hours.   Assessment/Plan:  Gabrielle Schultz is a 44 y.o. female presenting for evaluation of rectal bleeding and diarrhea. She does have diffuse colitis on CT with mural edema. She continues to have rectal bleeding. She does report a prior history of colitis on previous CT scan. I do  strongly suspect she has inflammatory bowel disease. No previous colonoscopy. I would recommend a colonoscopy to evaluate for IBD and take biopsies to confirm the diagnosis. We will plan for this procedure tomorrow morning. She will need to start on Solu-Medrol if appearance is consistent with inflammatory bowel disease. We will continue to follow up her stool culture although her C. difficile is negative this time.  Recommendations: - Clear liquids today. -MiraLAX prep this evening. - Nothing by mouth after midnight -Colonoscopy in a.m. -Continue to monitor hemoglobin and setting of rectal bleeding -Further recs pending above   Thank you for the consult. Please call with questions or concerns.  REIN, Grace Blight, MD

## 2014-06-26 NOTE — Care Management (Addendum)
Admitted to [facility] with the diagnosis of acute colitis. Lives with husband, Gwyndolyn Saxon [phone]). Goes to the [facility], was at Clinic last Thursday. Will update Lorrie at the [facility]. No home health. No skilled facility. 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 States her husband helps with the instrumental activities of daily living. He also helps with the [place] activities of daily  living sometimes. Family will transport. Scheduled for Colonoscopy  Shelbie Ammons RN MSN Care Management [phone]

## 2014-06-26 NOTE — Anesthesia Postprocedure Evaluation (Signed)
  Anesthesia Post-op Note  Patient: Gabrielle Schultz  Procedure(s) Performed: Procedure(s): COLONOSCOPY WITH PROPOFOL (N/A)  Anesthesia type:General  Patient location: PACU  Post pain: Pain level controlled  Post assessment: Post-op Vital signs reviewed, Patient's Cardiovascular Status Stable, Respiratory Function Stable, Patent Airway and No signs of Nausea or vomiting  Post vital signs: Reviewed and stable  Last Vitals:  Filed Vitals:   06/26/14 1154  BP: 134/83  Pulse: 83  Temp: 35.9 C  Resp: 12    Level of consciousness: awake, alert  and patient cooperative  Complications: No apparent anesthesia complications

## 2014-06-26 NOTE — Progress Notes (Signed)
Colon:   Inflammation from 15 to 45 cm. Biopsied.   Two large flat asc polyps.   This is likely ischemic colitis and not UC or Crohn's.   Recs: - hematology consult regarding possible hyper-coag work up since this is her second episode of likely ischemic colitis. -  Advance diet as tolerated - f/u biopsies - stop smoking

## 2014-06-26 NOTE — Anesthesia Preprocedure Evaluation (Addendum)
Anesthesia Evaluation  Patient identified by MRN, date of birth, ID band Patient awake    Reviewed: Allergy & Precautions, NPO status , Patient's Chart, lab work & pertinent test results  History of Anesthesia Complications Negative for: history of anesthetic complications  Airway Mallampati: III       Dental  (+) Teeth Intact   Pulmonary COPDCurrent Smoker,    + decreased breath sounds      Cardiovascular hypertension, Pt. on medications + CAD, + Past MI and + Peripheral Vascular Disease Rhythm:Regular     Neuro/Psych  Headaches, CVA negative psych ROS   GI/Hepatic Neg liver ROS, GERD-  ,  Endo/Other  negative endocrine ROS  Renal/GU Renal InsufficiencyRenal disease  negative genitourinary   Musculoskeletal negative musculoskeletal ROS (+)   Abdominal (+) + obese,   Peds negative pediatric ROS (+)  Hematology  (+) anemia ,   Anesthesia Other Findings   Reproductive/Obstetrics negative OB ROS                            Anesthesia Physical Anesthesia Plan  ASA: III  Anesthesia Plan: General   Post-op Pain Management:    Induction: Intravenous  Airway Management Planned: Nasal Cannula  Additional Equipment:   Intra-op Plan:   Post-operative Plan:   Informed Consent: I have reviewed the patients History and Physical, chart, labs and discussed the procedure including the risks, benefits and alternatives for the proposed anesthesia with the patient or authorized representative who has indicated his/her understanding and acceptance.     Plan Discussed with: CRNA  Anesthesia Plan Comments:         Anesthesia Quick Evaluation

## 2014-06-26 NOTE — Interval H&P Note (Signed)
History and Physical Interval Note:  06/26/2014 11:02 AM  Gabrielle Schultz  has presented today for surgery, with the diagnosis of rectal bleeding, diarrhea  The various methods of treatment have been discussed with the patient and family. After consideration of risks, benefits and other options for treatment, the patient has consented to  Procedure(s): COLONOSCOPY WITH PROPOFOL (N/A) as a surgical intervention .  The patient's history has been reviewed, patient examined, no change in status, stable for surgery.  I have reviewed the patient's chart and labs.  Questions were answered to the patient's satisfaction.     Jshawn Hurta GORDON

## 2014-06-27 ENCOUNTER — Ambulatory Visit: Payer: Self-pay

## 2014-06-27 ENCOUNTER — Ambulatory Visit: Payer: Self-pay | Admitting: Ophthalmology

## 2014-06-27 LAB — BASIC METABOLIC PANEL
Anion gap: 4 — ABNORMAL LOW (ref 5–15)
BUN: 12 mg/dL (ref 6–20)
CALCIUM: 8.4 mg/dL — AB (ref 8.9–10.3)
CHLORIDE: 113 mmol/L — AB (ref 101–111)
CO2: 20 mmol/L — AB (ref 22–32)
CREATININE: 1.59 mg/dL — AB (ref 0.44–1.00)
GFR calc Af Amer: 46 mL/min — ABNORMAL LOW (ref >60)
GFR calc non Af Amer: 39 mL/min — ABNORMAL LOW (ref >60)
Glucose, Bld: 99 mg/dL (ref 65–99)
Potassium: 4.2 mmol/L (ref 3.5–5.1)
Sodium: 137 mmol/L (ref 135–145)

## 2014-06-27 LAB — CBC
HEMATOCRIT: 32.2 % — AB (ref 35.0–47.0)
Hemoglobin: 10.4 g/dL — ABNORMAL LOW (ref 12.0–16.0)
MCH: 31.6 pg (ref 26.0–34.0)
MCHC: 32.3 g/dL (ref 32.0–36.0)
MCV: 97.6 fL (ref 80.0–100.0)
Platelets: 117 10*3/uL — ABNORMAL LOW (ref 150–440)
RBC: 3.3 MIL/uL — ABNORMAL LOW (ref 3.80–5.20)
RDW: 13.6 % (ref 11.5–14.5)
WBC: 6.5 10*3/uL (ref 3.6–11.0)

## 2014-06-27 LAB — STOOL CULTURE: SPECIAL REQUESTS: NORMAL

## 2014-06-27 LAB — SURGICAL PATHOLOGY

## 2014-06-27 NOTE — Discharge Instructions (Signed)

## 2014-06-27 NOTE — Discharge Summary (Signed)
[facility] NAME: Gabrielle Schultz    MR#:  [id number]  DATE OF BIRTH:  1970/03/18  DATE OF ADMISSION:  06/25/2014 ADMITTING PHYSICIAN: Henreitta Leber, MD  DATE OF DISCHARGE: 06/27/2014 11:01 AM  PRIMARY CARE PHYSICIAN:  No PCP, Pt. To follow up w/ Dr. Rayann Heman, Dr. Oliva Bustard   ADMISSION DIAGNOSIS:  Lower abdominal pain [R10.30] Hypotension, unspecified hypotension type [I95.9] Gastrointestinal hemorrhage, unspecified gastritis, unspecified gastrointestinal hemorrhage type [K92.2]  DISCHARGE DIAGNOSIS:  Active Problems:   Acute colitis   SECONDARY DIAGNOSIS:   Past Medical History  Diagnosis Date  . History of syncope   . Acid reflux   . Carpal tunnel syndrome     right hand  . Hypertension 2008  . Syncope and collapse   . Poor circulation   . Acid reflux   . Migraines   . Colitis   . MI (myocardial infarction)     2015  . CVA (cerebral infarction)     2009, 2015    HOSPITAL COURSE:   44 year old female with past medical history of hypertension, migraines, history of colitis, history of syncope, who presented to the hospital with abdominal pain and CT scan findings suggestive of acute colitis.  #1 acute colitis-this was likely the cause of patient's abdominal pain. Patient did have CT evidence of acute colitis and this is her second bout of colitis. -Patient was empirically started on IV ciprofloxacin and Flagyl and a gastroenterology consult was obtained. -Patient underwent colonoscopy which did not show any evidence of inflammatory or pseudomembranous colitis. Findings were most suggestive of ischemic colitis. Patient was therefore taken off antibiotics as she was not having any diarrhea and her stool for C. difficile was actually negative. -Patient did have thrombophilia workup done which is currently pending, patient is being arranged for outpatient follow-up with hematology with Dr. Oliva Bustard. -Patient presently has  no further abdominal pain, nausea, vomiting and is not tolerating a regular diet and therefore being discharged home.  #2 leukocytosis-this was likely secondary to the acute colitis and has since then improved and resolved this patient has clinically improved.  #3 hypertension-patient remained hemodynamic stable she will continue her lisinopril, clonidine.  #4 GERD-patient will continue her ranitidine  DISCHARGE CONDITIONS:   Stable  CONSULTS OBTAINED:  Treatment Team:  Josefine Class, MD Forest Gleason, MD  DRUG ALLERGIES:   Allergies  Allergen Reactions  . Banana   . Coconut Oil   . Strawberry     DISCHARGE MEDICATIONS:   Discharge Medication List as of 06/27/2014 10:06 AM    CONTINUE these medications which have NOT CHANGED   Details  amLODipine (NORVASC) 5 MG tablet Take 5 mg by mouth daily., Until Discontinued, Historical Med    aspirin 81 MG tablet Take 81 mg by mouth daily., Until Discontinued, Historical Med    cloNIDine (CATAPRES) 0.1 MG tablet Take 1 tablet (0.1 mg total) by mouth 2 (two) times daily., Starting 08/12/2011, Until Discontinued, Print    lisinopril (PRINIVIL,ZESTRIL) 20 MG tablet Take 20 mg by mouth daily., Until Discontinued, Historical Med    ranitidine (ZANTAC) 150 MG tablet Take 150 mg by mouth 2 (two) times daily. , Until Discontinued, Historical Med    nicotine (NICODERM CQ - DOSED IN MG/24 HOURS) 21 mg/24hr patch Place 1 patch onto the skin daily., Until Discontinued, Historical Med      STOP taking these medications     lisinopril-hydrochlorothiazide (PRINZIDE) 20-12.5 MG per tablet  DISCHARGE INSTRUCTIONS:   DIET:  Cardiac diet  DISCHARGE CONDITION:  Stable  ACTIVITY:  Activity as tolerated  OXYGEN:  Home Oxygen: No.   Oxygen Delivery: room air  DISCHARGE LOCATION:  home   If you experience worsening of your admission symptoms, develop shortness of breath, life threatening emergency, suicidal or homicidal  thoughts you must seek medical attention immediately by calling 911 or calling your MD immediately  if symptoms less severe.  You Must read complete instructions/literature along with all the possible adverse reactions/side effects for all the Medicines you take and that have been prescribed to you. Take any new Medicines after you have completely understood and accpet all the possible adverse reactions/side effects.   Please note  You were cared for by a hospitalist during your hospital stay. If you have any questions about your discharge medications or the care you received while you were in the hospital after you are discharged, you can call the unit and asked to speak with the hospitalist on call if the hospitalist that took care of you is not available. Once you are discharged, your primary care physician will handle any further medical issues. Please note that NO REFILLS for any discharge medications will be authorized once you are discharged, as it is imperative that you return to your primary care physician (or establish a relationship with a primary care physician if you do not have one) for your aftercare needs so that they can reassess your need for medications and monitor your lab values.     Today   Patient denies any abdominal pain, nausea, vomiting, diarrhea. Tolerating regular diet well  VITAL SIGNS:  Blood pressure 161/54, pulse 60, temperature 99 F (37.2 C), temperature source Oral, resp. rate 20, height 5\' 1"  (1.549 m), weight 71.215 kg (157 lb), last menstrual period 05/20/2014, SpO2 100 %.  I/O:   Intake/Output Summary (Last 24 hours) at 06/27/14 1455 Last data filed at 06/27/14 0900  Gross per 24 hour  Intake    840 ml  Output      0 ml  Net    840 ml    PHYSICAL EXAMINATION:  GENERAL:  44 y.o.-year-old patient lying in the bed with no acute distress.  EYES: Pupils equal, round, reactive to light and accommodation. No scleral icterus. Extraocular muscles intact.   HEENT: Head atraumatic, normocephalic. Oropharynx and nasopharynx clear.  NECK:  Supple, no jugular venous distention. No thyroid enlargement, no tenderness.  LUNGS: Normal breath sounds bilaterally, no wheezing, rales,rhonchi. No use of accessory muscles of respiration.  CARDIOVASCULAR: S1, S2 RRR. No murmurs, rubs, or gallops.  ABDOMEN: Soft, non-tender, non-distended. Bowel sounds present. No organomegaly or mass.  EXTREMITIES: No pedal edema, cyanosis, or clubbing.  NEUROLOGIC: Cranial nerves II through XII are intact. No focal motor or sensory defecits b/l.  PSYCHIATRIC: The patient is alert and oriented x 3. Good affect.  SKIN: No obvious rash, lesion, or ulcer.   DATA REVIEW:   CBC  Recent Labs Lab 06/27/14 0621  WBC 6.5  HGB 10.4*  HCT 32.2*  PLT 117*    Chemistries   Recent Labs Lab 06/25/14 0515  06/27/14 0621  NA 134*  < > 137  K 3.7  < > 4.2  CL 108  < > 113*  CO2 19*  < > 20*  GLUCOSE 163*  < > 99  BUN 24*  < > 12  CREATININE 1.90*  < > 1.59*  CALCIUM 9.2  < > 8.4*  AST  27  --   --   ALT 13*  --   --   ALKPHOS 115  --   --   BILITOT 0.7  --   --   < > = values in this interval not displayed.  Cardiac Enzymes  Recent Labs Lab 06/25/14 0515  TROPONINI 0.04*    Microbiology Results  Results for orders placed or performed during the hospital encounter of 06/25/14  C difficile quick scan w PCR reflex ([place] only)     Status: None   Collection Time: 06/25/14  5:15 AM  Result Value Ref Range Status   C Diff antigen NEGATIVE  Final   C Diff toxin NEGATIVE  Final   C Diff interpretation Negative for C. difficile  Final  Stool culture     Status: None   Collection Time: 06/25/14 10:34 PM  Result Value Ref Range Status   Specimen Description STOOL  Final   Special Requests Normal  Final   Culture   Final    NO SALMONELLA OR SHIGELLA ISOLATED No Pathogenic E. coli detected NO CAMPYLOBACTER DETECTED REDUCED NORMAL FECAL FLORA    Report Status  06/27/2014 FINAL  Final    RADIOLOGY:  No results found.    Management plans discussed with the patient, family and they are in agreement.  CODE STATUS:   TOTAL TIME TAKING CARE OF THIS PATIENT: 40 minutes.    Henreitta Leber M.D on 06/27/2014 at 2:55 PM  Between 7am to 6pm - Pager - [phone]  After 6pm go to [email or web address] - password [email or web address] [facility]  [facility]  Office  [phone]  CC: Primary care physician; No primary care provider on file.

## 2014-06-27 NOTE — Plan of Care (Signed)
Problem: Discharge Progression Outcomes Goal: Other Discharge Outcomes/Goals Outcome: Progressing Plan of Care Progress to Goals:  Pain: Pt with no complaints of pain.  Hemo:  VSS. Pt states no diarrhea this shift.  Diet:  Pt on full liquid diet. Tolerating. Activity:  Pt up in room. Independent with ADL's

## 2014-06-27 NOTE — Progress Notes (Signed)
Pt given d/c instructions r/t medications, activity and followup care, voiced understanding, pt d/c home via wheelchair escorted by family and auxillary

## 2014-06-28 LAB — CARDIOLIPIN ANTIBODIES, IGG, IGM, IGA: Anticardiolipin IgM: <9 MPL U/mL (ref 0–12)

## 2014-06-28 LAB — BETA-2-GLYCOPROTEIN I ABS, IGG/M/A
Beta-2-Glycoprotein I IgA: <9 GPI IgA units (ref 0–25)
Beta-2-Glycoprotein I IgM: <9 GPI IgM units (ref 0–32)

## 2014-06-28 LAB — HOMOCYSTEINE: Homocysteine: 20.1 umol/L — ABNORMAL HIGH (ref 0.0–15.0)

## 2014-07-01 LAB — LUPUS ANTICOAGULANT PANEL
DRVVT: 42.3 s (ref 0.0–55.1)
PTT LA: 47 s (ref 0.0–50.0)

## 2014-07-01 LAB — PROTEIN S ACTIVITY: Protein S Activity: 64 % (ref 60–145)

## 2014-07-01 LAB — PROTEIN C, TOTAL: PROTEIN C, TOTAL: 47 % — AB (ref 70–140)

## 2014-07-01 LAB — PROTEIN C ACTIVITY: Protein C Activity: 80 % (ref 74–151)

## 2014-07-01 LAB — PROTEIN S, TOTAL: PROTEIN S AG TOTAL: 77 % (ref 58–150)

## 2014-07-01 LAB — FACTOR 5 LEIDEN

## 2014-07-01 LAB — PROTHROMBIN GENE MUTATION

## 2014-07-04 ENCOUNTER — Inpatient Hospital Stay: Payer: Self-pay | Attending: Oncology | Admitting: Oncology

## 2014-07-04 ENCOUNTER — Ambulatory Visit: Payer: Self-pay

## 2014-07-04 ENCOUNTER — Ambulatory Visit: Payer: Self-pay | Admitting: Ophthalmology

## 2014-07-04 VITALS — BP 115/74 | HR 56 | Temp 96.0°F | Wt 149.5 lb

## 2014-07-04 DIAGNOSIS — Z8669 Personal history of other diseases of the nervous system and sense organs: Secondary | ICD-10-CM | POA: Insufficient documentation

## 2014-07-04 DIAGNOSIS — K219 Gastro-esophageal reflux disease without esophagitis: Secondary | ICD-10-CM | POA: Insufficient documentation

## 2014-07-04 DIAGNOSIS — I517 Cardiomegaly: Secondary | ICD-10-CM | POA: Insufficient documentation

## 2014-07-04 DIAGNOSIS — R109 Unspecified abdominal pain: Secondary | ICD-10-CM | POA: Insufficient documentation

## 2014-07-04 DIAGNOSIS — R197 Diarrhea, unspecified: Secondary | ICD-10-CM | POA: Insufficient documentation

## 2014-07-04 DIAGNOSIS — F17210 Nicotine dependence, cigarettes, uncomplicated: Secondary | ICD-10-CM | POA: Insufficient documentation

## 2014-07-04 DIAGNOSIS — R609 Edema, unspecified: Secondary | ICD-10-CM | POA: Insufficient documentation

## 2014-07-04 DIAGNOSIS — R001 Bradycardia, unspecified: Secondary | ICD-10-CM | POA: Insufficient documentation

## 2014-07-04 DIAGNOSIS — D689 Coagulation defect, unspecified: Secondary | ICD-10-CM

## 2014-07-04 DIAGNOSIS — I252 Old myocardial infarction: Secondary | ICD-10-CM | POA: Insufficient documentation

## 2014-07-04 DIAGNOSIS — R531 Weakness: Secondary | ICD-10-CM | POA: Insufficient documentation

## 2014-07-04 DIAGNOSIS — K529 Noninfective gastroenteritis and colitis, unspecified: Secondary | ICD-10-CM | POA: Insufficient documentation

## 2014-07-04 DIAGNOSIS — D649 Anemia, unspecified: Secondary | ICD-10-CM | POA: Insufficient documentation

## 2014-07-04 DIAGNOSIS — K922 Gastrointestinal hemorrhage, unspecified: Secondary | ICD-10-CM | POA: Insufficient documentation

## 2014-07-04 DIAGNOSIS — I10 Essential (primary) hypertension: Secondary | ICD-10-CM | POA: Insufficient documentation

## 2014-07-04 DIAGNOSIS — Z8673 Personal history of transient ischemic attack (TIA), and cerebral infarction without residual deficits: Secondary | ICD-10-CM | POA: Insufficient documentation

## 2014-07-04 DIAGNOSIS — Z7982 Long term (current) use of aspirin: Secondary | ICD-10-CM | POA: Insufficient documentation

## 2014-07-04 DIAGNOSIS — Z79899 Other long term (current) drug therapy: Secondary | ICD-10-CM | POA: Insufficient documentation

## 2014-07-04 LAB — COMPREHENSIVE METABOLIC PANEL
ALK PHOS: 86 U/L (ref 38–126)
ALT: 11 U/L — ABNORMAL LOW (ref 14–54)
AST: 14 U/L — ABNORMAL LOW (ref 15–41)
Albumin: 3.8 g/dL (ref 3.5–5.0)
Anion gap: 4 — ABNORMAL LOW (ref 5–15)
BILIRUBIN TOTAL: 0.5 mg/dL (ref 0.3–1.2)
BUN: 11 mg/dL (ref 6–20)
CALCIUM: 8.3 mg/dL — AB (ref 8.9–10.3)
CHLORIDE: 107 mmol/L (ref 101–111)
CO2: 21 mmol/L — ABNORMAL LOW (ref 22–32)
Creatinine, Ser: 1.63 mg/dL — ABNORMAL HIGH (ref 0.44–1.00)
GFR calc Af Amer: 44 mL/min — ABNORMAL LOW (ref >60)
GFR calc non Af Amer: 38 mL/min — ABNORMAL LOW (ref >60)
Glucose, Bld: 88 mg/dL (ref 65–99)
Potassium: 4.7 mmol/L (ref 3.5–5.1)
Sodium: 132 mmol/L — ABNORMAL LOW (ref 135–145)
Total Protein: 7.3 g/dL (ref 6.5–8.1)

## 2014-07-04 LAB — CBC WITH DIFFERENTIAL/PLATELET
BASOS ABS: 0.1 10*3/uL (ref 0–0.1)
Basophils Relative: 1 %
Eosinophils Absolute: 0.2 10*3/uL (ref 0–0.7)
Eosinophils Relative: 3 %
HCT: 37.5 % (ref 35.0–47.0)
HEMOGLOBIN: 12.2 g/dL (ref 12.0–16.0)
Lymphocytes Relative: 24 %
Lymphs Abs: 2 10*3/uL (ref 1.0–3.6)
MCH: 31.4 pg (ref 26.0–34.0)
MCHC: 32.4 g/dL (ref 32.0–36.0)
MCV: 96.8 fL (ref 80.0–100.0)
MONO ABS: 0.6 10*3/uL (ref 0.2–0.9)
MONOS PCT: 7 %
Neutro Abs: 5.5 10*3/uL (ref 1.4–6.5)
Neutrophils Relative %: 65 %
PLATELETS: 179 10*3/uL (ref 150–440)
RBC: 3.88 MIL/uL (ref 3.80–5.20)
RDW: 13.6 % (ref 11.5–14.5)
WBC: 8.4 10*3/uL (ref 3.6–11.0)

## 2014-07-04 LAB — SEDIMENTATION RATE: Sed Rate: 36 mm/hr — ABNORMAL HIGH (ref 0–20)

## 2014-07-04 LAB — LACTATE DEHYDROGENASE: LDH: 118 U/L (ref 98–192)

## 2014-07-04 NOTE — Progress Notes (Signed)
Patient does not have living will.  Current smoker. Patient here for hospital follow up for ischemic colitis.

## 2014-07-05 LAB — ANA COMPREHENSIVE PANEL
Anti JO-1: <0.2 AI (ref 0.0–0.9)
ENA SM Ab Ser-aCnc: <0.2 AI (ref 0.0–0.9)
RIBONUCLEIC PROTEIN: 0.5 AI (ref 0.0–0.9)
SSB (La) (ENA) Antibody, IgG: <0.2 AI (ref 0.0–0.9)
Scleroderma (Scl-70) (ENA) Antibody, IgG: <0.2 AI (ref 0.0–0.9)
ds DNA Ab: 1 IU/mL (ref 0–9)

## 2014-07-09 LAB — ANTITHROMBIN III: AntiThromb III Func: 71 % — ABNORMAL LOW (ref 75–120)

## 2014-07-11 ENCOUNTER — Encounter: Payer: Self-pay | Admitting: Oncology

## 2014-07-11 NOTE — Progress Notes (Signed)
[place] [facility] Telephone:(336) [phone]  Fax:([phone]) [phone]     ARLEENE SETTLE OB: 05-Feb-1970  MR#: [id number]  WHQ#:[id number]  Patient Care Team: No Pcp Per Patient as PCP - General (General Practice)  CHIEF COMPLAINT:  Chief Complaint  Patient presents with  . Follow-up   44 year old lady who had been admitted in the hospital with abdominal discomfort GI bleeding was considered to have possibility of ischemic colitis and now being referred here for further evaluation regarding hypercoagulable stage. Patient had a previous history of runs in the stomach at that and stroke in the past 21 with a peripheral vision lost another 1 with weakness in the right upper extremity. Father had deep vein thrombosis (may be arterial thrombosis patient is not very clear in the history.)  There is no other significant family history of thrombosis or embolism    INTERVAL HISTORY:  Since last admission to the hospital patient did not have any abdominal pain and GI bleeding.  No nausea no vomiting no diarrhea. REVIEW OF SYSTEMS:   GENERAL:  Feels good.  Active.  No fevers, sweats or weight loss. PERFORMANCE STATUS (ECOG)0 HEENT:  No visual changes, runny nose, sore throat, mouth sores or tenderness. Lungs: No shortness of breath or cough.  No hemoptysis. Cardiac:  No chest pain, palpitations, orthopnea, or PND. GI:  No nausea, vomiting, diarrhea, constipation, melena or hematochezia. GU:  No urgency, frequency, dysuria, or hematuria. Musculoskeletal:  No back pain.  No joint pain.  No muscle tenderness. Extremities:  No pain or swelling. Skin:  No rashes or skin changes. Neuro:  No headache, numbness or weakness, balance or coordination issues. Endocrine:  No diabetes, thyroid issues, hot flashes or night sweats. Psych:  No mood changes, depression or anxiety. Pain:  No focal pain. Review of systems:  All other systems reviewed and found to be negative. As per HPI. Otherwise, a complete  review of systems is negatve.  PAST MEDICAL HISTORY: Past Medical History  Diagnosis Date  . History of syncope   . Acid reflux   . Carpal tunnel syndrome     right hand  . Hypertension 2008  . Syncope and collapse   . Poor circulation   . Acid reflux   . Migraines   . Colitis   . MI (myocardial infarction)     2015  . CVA (cerebral infarction)     2009, 2015    PAST SURGICAL HISTORY: Past Surgical History  Procedure Laterality Date  . Cesarean section  2004  . Colonoscopy with propofol N/A 06/26/2014    Procedure: COLONOSCOPY WITH PROPOFOL;  Surgeon: Josefine Class, MD;  Location: [facility] ENDOSCOPY;  Service: Endoscopy;  Laterality: N/A;    FAMILY HISTORY Family History  Problem Relation Age of Onset  . Hyperlipidemia Mother   . Heart disease Mother   . Hypertension Father   . Heart disease Father   . Hyperlipidemia Father   . Hypertension Sister     ADVANCED DIRECTIVES:  Patient does not have any living will or healthcare power of attorney.  Information was given .  Available resources had been discussed.  We will follow-up on subsequent appointments regarding this issue HEALTH MAINTENANCE: History  Substance Use Topics  . Smoking status: Current Every Day Smoker -- 0.50 packs/day for 18 years    Types: Cigarettes  . Smokeless tobacco: Not on file  . Alcohol Use: Yes     Comment: occasionally      Allergies  Allergen Reactions  .  Banana   . Coconut Oil   . Strawberry     Current Outpatient Prescriptions  Medication Sig Dispense Refill  . amLODipine (NORVASC) 5 MG tablet Take 5 mg by mouth daily.    Marland Kitchen aspirin 81 MG tablet Take 81 mg by mouth daily.    . cloNIDine (CATAPRES) 0.1 MG tablet Take 1 tablet (0.1 mg total) by mouth 2 (two) times daily. 60 tablet 11  . lisinopril (PRINIVIL,ZESTRIL) 20 MG tablet Take 20 mg by mouth daily.    . ranitidine (ZANTAC) 150 MG tablet Take 150 mg by mouth 2 (two) times daily.     . simvastatin (ZOCOR) 20 MG tablet  Take 20 mg by mouth daily.    . nicotine (NICODERM CQ - DOSED IN MG/24 HOURS) 21 mg/24hr patch Place 1 patch onto the skin daily.     No current facility-administered medications for this visit.    OBJECTIVE:  Filed Vitals:   07/04/14 0956  BP: 115/74  Pulse: 56  Temp: 96 F (35.6 C)     Body mass index is 28.26 kg/(m^2).    ECOG FS:0 - Asymptomatic  PHYSICAL EXAM: GENERAL:  Well developed, well nourished, sitting comfortably in the exam room in no acute distress. MENTAL STATUS:  Alert and oriented to person, place and time. HEAD: .  Normocephalic, atraumatic, face symmetric, no Cushingoid features. EYES:  .  Pupils equal round and reactive to light and accomodation.  No conjunctivitis or scleral icterus. ENT:  Oropharynx clear without lesion.  Tongue normal. Mucous membranes moist.  RESPIRATORY:  Clear to auscultation without rales, wheezes or rhonchi. CARDIOVASCULAR:  Regular rate and rhythm without murmur, rub or gallop. BREAST:  Right breast without masses, skin changes or nipple discharge.  Left breast without masses, skin changes or nipple discharge. ABDOMEN:  Soft, non-tender, with active bowel sounds, and no hepatosplenomegaly.  No masses. BACK:  No CVA tenderness.  No tenderness on percussion of the back or rib cage. SKIN:  No rashes, ulcers or lesions. EXTREMITIES: No edema, no skin discoloration or tenderness.  No palpable cords. LYMPH NODES: No palpable cervical, supraclavicular, axillary or inguinal adenopathy  NEUROLOGICAL: Unremarkable. PSYCH:  Appropriate.  LAB RESULTS:  Appointment on 07/04/2014  Component Date Value Ref Range Status  . WBC 07/04/2014 8.4  3.6 - 11.0 K/uL Final  . RBC 07/04/2014 3.88  3.80 - 5.20 MIL/uL Final  . Hemoglobin 07/04/2014 12.2  12.0 - 16.0 g/dL Final  . HCT 07/04/2014 37.5  35.0 - 47.0 % Final  . MCV 07/04/2014 96.8  80.0 - 100.0 fL Final  . MCH 07/04/2014 31.4  26.0 - 34.0 pg Final  . MCHC 07/04/2014 32.4  32.0 - 36.0 g/dL  Final  . RDW 07/04/2014 13.6  11.5 - 14.5 % Final  . Platelets 07/04/2014 179  150 - 440 K/uL Final  . Neutrophils Relative % 07/04/2014 65   Final  . Neutro Abs 07/04/2014 5.5  1.4 - 6.5 K/uL Final  . Lymphocytes Relative 07/04/2014 24   Final  . Lymphs Abs 07/04/2014 2.0  1.0 - 3.6 K/uL Final  . Monocytes Relative 07/04/2014 7   Final  . Monocytes Absolute 07/04/2014 0.6  0.2 - 0.9 K/uL Final  . Eosinophils Relative 07/04/2014 3   Final  . Eosinophils Absolute 07/04/2014 0.2  0 - 0.7 K/uL Final  . Basophils Relative 07/04/2014 1   Final  . Basophils Absolute 07/04/2014 0.1  0 - 0.1 K/uL Final  . Sodium 07/04/2014 132* 135 -  145 mmol/L Final  . Potassium 07/04/2014 4.7  3.5 - 5.1 mmol/L Final  . Chloride 07/04/2014 107  101 - 111 mmol/L Final  . CO2 07/04/2014 21* 22 - 32 mmol/L Final  . Glucose, Bld 07/04/2014 88  65 - 99 mg/dL Final  . BUN 07/04/2014 11  6 - 20 mg/dL Final  . Creatinine, Ser 07/04/2014 1.63* 0.44 - 1.00 mg/dL Final  . Calcium 07/04/2014 8.3* 8.9 - 10.3 mg/dL Final  . Total Protein 07/04/2014 7.3  6.5 - 8.1 g/dL Final  . Albumin 07/04/2014 3.8  3.5 - 5.0 g/dL Final  . AST 07/04/2014 14* 15 - 41 U/L Final  . ALT 07/04/2014 11* 14 - 54 U/L Final  . Alkaline Phosphatase 07/04/2014 86  38 - 126 U/L Final  . Total Bilirubin 07/04/2014 0.5  0.3 - 1.2 mg/dL Final  . GFR calc non Af Amer 07/04/2014 38* >60 mL/min Final  . GFR calc Af Amer 07/04/2014 44* >60 mL/min Final   Comment: (NOTE) The eGFR has been calculated using the CKD EPI equation. This calculation has not been validated in all clinical situations. eGFR's persistently <60 mL/min signify possible Chronic Kidney Disease.   . Anion gap 07/04/2014 4* 5 - 15 Final  . Sed Rate 07/04/2014 36* 0 - 20 mm/hr Final  . LDH 07/04/2014 118  98 - 192 U/L Final  . ds DNA Ab 07/04/2014 1  0 - 9 IU/mL Final   Comment: (NOTE)                                   Negative      <5                                   Equivocal  5  - 9                                   Positive      >9   . Ribonucleic Protein 07/04/2014 0.5  0.0 - 0.9 AI Final  . ENA SM Ab Ser-aCnc 07/04/2014 <0.2  0.0 - 0.9 AI Final  . Scleroderma (Scl-70) (ENA) Antibod* 07/04/2014 <0.2  0.0 - 0.9 AI Final  . SSA (Ro) (ENA) Antibody, IgG 07/04/2014 <0.2  0.0 - 0.9 AI Final  . SSB (La) (ENA) Antibody, IgG 07/04/2014 <0.2  0.0 - 0.9 AI Final  . Chromatin Ab SerPl-aCnc 07/04/2014 <0.2  0.0 - 0.9 AI Final  . Anti JO-1 07/04/2014 <0.2  0.0 - 0.9 AI Final  . Centromere Ab Screen 07/04/2014 <0.2  0.0 - 0.9 AI Final  . See below: 07/04/2014 Comment   Final   Comment: (NOTE) Autoantibody                       Disease Association ------------------------------------------------------------                        Condition                  Frequency ---------------------   ------------------------   --------- Antinuclear Antibody,    SLE, mixed connective Direct (ANA-D)           tissue diseases ---------------------   ------------------------   ---------  dsDNA                    SLE                        40 - 60% ---------------------   ------------------------   --------- Chromatin                Drug induced SLE                90%                         SLE                        48 - 97% ---------------------   ------------------------   --------- SSA (Ro)                 SLE                        25 - 35%                         Sjogren's Syndrome         40 - 70%                         Neonatal Lupus                 100% ---------------------   ------------------------   --------- SSB (La)                 SLE                                                       10%                         Sjogren's Syndrome              30% ---------------------   -----------------------    --------- Sm (anti-Smith)          SLE                        15 - 30% ---------------------   -----------------------    --------- RNP                      Mixed  Connective Tissue                         Disease                         95% (U1 nRNP,                SLE                        30 - 50% anti-ribonucleoprotein)  Polymyositis and/or                         Dermatomyositis  20% ---------------------   ------------------------   --------- Scl-70 (antiDNA          Scleroderma (diffuse)      20 - 35% topoisomerase)           Crest                           13% ---------------------   ------------------------   --------- Jo-1                     Polymyositis and/or                         Dermatomyositis            20 - 40% ---------------------   ------------------------   --------- Centromere B             Scleroderma -                           Crest                         variant                         80% Performed At: [facility] [place], [place] [id number] Lindon Romp MD PJ:[id number]       STUDIES: Ct Abdomen Pelvis W Contrast  06/25/2014   CLINICAL DATA:  44 year old female with abdominal pain, hypotension, diarrhea, weakness. Initial encounter.  EXAM: CT ABDOMEN AND PELVIS WITH CONTRAST  TECHNIQUE: Multidetector CT imaging of the abdomen and pelvis was performed using the standard protocol following bolus administration of intravenous contrast.  CONTRAST:  13mL OMNIPAQUE IOHEXOL 300 MG/ML  SOLN  COMPARISON:  Noncontrast CT Abdomen and Pelvis 10/19/2012  FINDINGS: Stable cardiomegaly. No pericardial or pleural effusion. Negative lung bases, chronic scar or atelectasis in the right middle lobe.  No acute osseous abnormality identified.  Small volume free fluid in the pelvis as well as the lower small bowel mesentery a. fluid in the rectum. Decompressed sigmoid colon with suggestion of some mucosal hyper enhancement. Diminutive urinary bladder. Negative uterus and adnexa.  Indistinct appearance of the descending colon with wall thickening and mesenteric stranding. Mucosal hyper enhancement.  The abnormal appearance continues into the transverse colon. The hepatic flexure is involved to a lesser extent, with indistinct wall thickening and mucosal hyper enhancement. The ascending colon also is affected. The appendix remains within normal limits. No dilated small bowel, however, there is some distal small bowel mucosal hyper enhancement. The stomach is distended with oral contrast, questionable gastric antral thickening. The duodenum bulb appears to remain normal. The remaining duodenum is decompressed.  Liver, gallbladder, spleen, pancreas and adrenal glands are within normal limits. Portal venous system is patent. Aortoiliac calcified atherosclerosis noted. Major arterial structures are patent. No abdominal free fluid or free air. No lymphadenopathy. Renal cortical scarring intermittently noted bilaterally. Renal contrast enhancement and contrast excretion otherwise appears symmetric.  IMPRESSION: 1. Diffuse colitis. Mural edema suspicious for pseudomembranous colitis. Small volume free fluid, no pneumoperitoneum. 2. There is also evidence of distal small bowel mucosal hyper enhancement also suggesting infectious/inflammatory involvement. 3. Age advanced aortoiliac soft and calcified atherosclerotic plaque. Chronic renal cortical scarring.   Electronically Signed   By: Genevie Ann M.D.   On: 06/25/2014 09:41  ASSESSMENT: Frequent attack of colitis with GI bleeding.  CT scan is consistent with diffuse colitis.  GI physician suspected ischemic colitis. Patient had a previous history of transient ischemic attack.  Colonoscopy done during last hospitalization   shows she may colitis  MEDICAL DECISION MAKING:  Following lab data has been reviewed.  LDH is 118 Patient is anemic with hemoglobin of 10.4 Stool cultures were negative. Echocardiogram dated 07/20/2011 shows ejection fraction 50%, moderate LVH, mild TR and mild MR CT head was essentially normal Carotid ultrasound was essentially normal MR  of the brain showed areas of increased signal in the white matter distribution. Sed rate36 Protein S activity is normal Protein s total is normal.  Lupus anticoagulant is negative Homocysteine is high Factor V Leyden is negative Prothrombin gene mutation is negative EKG today shows sinus bradycardia with rate 48 beats per minute, nonspecific ST abnormality in the V5, V6, 1 and aVL consistent with LVH with strain pattern.  .   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  Patient will be referred to Dr. Ma Hillock  further evaluation and management.  I will sign off this case.   Patient expressed understanding and was in agreement with this plan. She also understands that She can call clinic at any time with any questions, concerns, or complaints.    No matching staging information was found for the patient.  Forest Gleason, MD   07/11/2014 7:58 AM

## 2014-07-12 ENCOUNTER — Inpatient Hospital Stay: Payer: Self-pay | Attending: Internal Medicine | Admitting: Internal Medicine

## 2014-07-12 VITALS — BP 140/79 | HR 64 | Temp 96.3°F | Resp 18 | Ht 61.0 in | Wt 147.5 lb

## 2014-07-12 DIAGNOSIS — K559 Vascular disorder of intestine, unspecified: Secondary | ICD-10-CM | POA: Insufficient documentation

## 2014-07-12 DIAGNOSIS — K219 Gastro-esophageal reflux disease without esophagitis: Secondary | ICD-10-CM | POA: Insufficient documentation

## 2014-07-12 DIAGNOSIS — I252 Old myocardial infarction: Secondary | ICD-10-CM | POA: Insufficient documentation

## 2014-07-12 DIAGNOSIS — F17210 Nicotine dependence, cigarettes, uncomplicated: Secondary | ICD-10-CM | POA: Insufficient documentation

## 2014-07-12 DIAGNOSIS — Z79899 Other long term (current) drug therapy: Secondary | ICD-10-CM | POA: Insufficient documentation

## 2014-07-12 DIAGNOSIS — I10 Essential (primary) hypertension: Secondary | ICD-10-CM | POA: Insufficient documentation

## 2014-07-12 DIAGNOSIS — R7982 Elevated C-reactive protein (CRP): Secondary | ICD-10-CM | POA: Insufficient documentation

## 2014-07-12 DIAGNOSIS — Z86010 Personal history of colonic polyps: Secondary | ICD-10-CM | POA: Insufficient documentation

## 2014-07-12 DIAGNOSIS — D558 Other anemias due to enzyme disorders: Secondary | ICD-10-CM | POA: Insufficient documentation

## 2014-07-12 DIAGNOSIS — Z7982 Long term (current) use of aspirin: Secondary | ICD-10-CM | POA: Insufficient documentation

## 2014-07-12 DIAGNOSIS — D529 Folate deficiency anemia, unspecified: Secondary | ICD-10-CM | POA: Insufficient documentation

## 2014-07-12 DIAGNOSIS — I7090 Unspecified atherosclerosis: Secondary | ICD-10-CM | POA: Insufficient documentation

## 2014-07-12 DIAGNOSIS — K529 Noninfective gastroenteritis and colitis, unspecified: Secondary | ICD-10-CM

## 2014-07-12 DIAGNOSIS — Z8673 Personal history of transient ischemic attack (TIA), and cerebral infarction without residual deficits: Secondary | ICD-10-CM | POA: Insufficient documentation

## 2014-07-12 NOTE — Progress Notes (Signed)
Patient states that she has been having a lot of off and on diarrhea. She states that this occurs about 2-3 times a week and she doesn't even have to eat anything for it to happen. She states that otherwise she has been feeling good.

## 2014-07-22 NOTE — Progress Notes (Signed)
[place]  Telephone:(336) [phone] Fax:(336) [phone]     ID: Gabrielle Schultz OB: [birth year]  MR#: [id number]  CSN#:[id number]  Patient Care Team: No Pcp Per Patient as PCP - General (General Practice)  CHIEF COMPLAINT/DIAGNOSIS:  History of recurrent ischemic colitis, recent hospitalization June 2016 (colonoscopy on 06/26/2014-polyps and proximal ascending colon. Segmental moderate inflammation in descending and sigmoid colon from 45 cm to 15 cm. Rectum spared. Suggestive of ischemic colitis. 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  (06/26/14 - anticardiolipin antibody G//M, lupus anticoagulate, beta-2 glycoprotein 1, factor V Leiden unremarkable. Protein S activity 64% (range 60-145), protein S total 77 (range 58 150), protein C activity 80% (range 74-151), protein C total 47 (range 70-140), anti-thrombin activity 71% (range 75-120).  HISTORY OF PRESENT ILLNESS:  Gabrielle Schultz is a 44 year old female was admitted to hospital in the end of June with abdominal pain and diarrhea with bloody stools. She had colonoscopy on June 22 as described above suggestive of ischemic colitis. Per discussion with GI Dr. Rayann Heman, this is her second episode of ischemic colitis. CT scan did not show any obvious DVT in major vessels patient has history of other vascular problems, she is history of MI in 2014, CVA 2 (in 2009 and 2015. States that in 2009, legs felt like Jell-O and she fell, was known for about 20 minutes but did not get medical attention and symptoms improved on its own. In October 2014, states that she went to the ER with colitis, also needed cardiac catheterization for chest pain and subendocardial MI, subsequently which states that she had  loss of peripheral vision left eye since then). States that she has extensive family history of heart disease. States that she has a neurologist appointment coming up regarding these history of strokes. Otherwise currently diarrhea is better, denies abdominal pain. Beginning to eat better. Denies history of DVT or pulmonary embolism. Denies any significant unintentional weight loss. No new bone pains. No new paresthesias in the extremities.  REVIEW OF SYSTEMS:   ROS As in HPI above. In addition, no fever, chills or sweats. No new headaches or focal weakness.  No sore throat, cough, shortness of breath, sputum, hemoptysis or chest pain. No dizziness or palpitation. No dysuria or hematuria. No new skin rash. No new paresthesias in extremities.   PAST MEDICAL HISTORY: Reviewed. Past Medical History  Diagnosis Date  . History of syncope   . Acid reflux   . Carpal tunnel syndrome     right hand  . Hypertension 2008  . Syncope and collapse   . Poor circulation   . Acid reflux   . Migraines   . Colitis   . MI (myocardial infarction)     2015  . CVA (cerebral infarction)     2009, 2015    PAST SURGICAL HISTORY: Reviewed. Past Surgical History  Procedure Laterality Date  . Cesarean section  2004  . Colonoscopy with propofol N/A 06/26/2014    Procedure: COLONOSCOPY WITH PROPOFOL;  Surgeon: Josefine Class, MD;  Location: [facility] ENDOSCOPY;  Service: Endoscopy;  Laterality: N/A;    FAMILY HISTORY: Reviewed. Family History  Problem Relation Age of Onset  . Hyperlipidemia Mother   . Heart disease Mother   . Hypertension Father   . Heart disease Father   .  Hyperlipidemia Father   . Hypertension Sister     SOCIAL HISTORY: Reviewed. History  Substance Use Topics  . Smoking status: Current Every Day Smoker -- 0.50 packs/day for 18 years    Types: Cigarettes  . Smokeless tobacco: Not on file  . Alcohol Use: Yes     Comment: occasionally    Allergies  Allergen Reactions  . Banana    . Coconut Oil   . Strawberry     Current Outpatient Prescriptions  Medication Sig Dispense Refill  . amLODipine (NORVASC) 5 MG tablet Take 5 mg by mouth daily.    Marland Kitchen aspirin 81 MG tablet Take 81 mg by mouth daily.    . cloNIDine (CATAPRES) 0.1 MG tablet Take 1 tablet (0.1 mg total) by mouth 2 (two) times daily. 60 tablet 11  . lisinopril (PRINIVIL,ZESTRIL) 20 MG tablet Take 20 mg by mouth daily.    . nicotine (NICODERM CQ - DOSED IN MG/24 HOURS) 21 mg/24hr patch Place 1 patch onto the skin daily.    . ranitidine (ZANTAC) 150 MG tablet Take 150 mg by mouth 2 (two) times daily.     . simvastatin (ZOCOR) 20 MG tablet Take 20 mg by mouth daily.     No current facility-administered medications for this visit.    PHYSICAL EXAM: Filed Vitals:   07/12/14 1540  BP: 140/79  Pulse: 64  Temp: 96.3 F (35.7 C)  Resp: 18     Body mass index is 27.88 kg/(m^2).      GENERAL: Patient is alert and oriented and in no acute distress. There is no icterus. HEENT: EOMs intact. Oral exam negative for thrush or lesions. No cervical lymphadenopathy. CVS: S1S2, regular LUNGS: Bilaterally clear to auscultation, no rhonchi. ABDOMEN: Soft, nontender. No hepatosplenomegaly clinically.  NEURO: grossly nonfocal, cranial nerves are intact.  EXTREMITIES: No pedal edema. LYMPHATICS: No palpable adenopathy in axillary or inguinal areas.   LAB RESULTS:    Component Value Date/Time   NA 132* 07/04/2014 1117   NA 132* 05/20/2013 0508   K 4.7 07/04/2014 1117   K 3.9 05/20/2013 0508   CL 107 07/04/2014 1117   CL 107 05/20/2013 0508   CO2 21* 07/04/2014 1117   CO2 21 05/20/2013 0508   GLUCOSE 88 07/04/2014 1117   GLUCOSE 91 05/20/2013 0508   BUN 11 07/04/2014 1117   BUN 16 05/20/2013 0508   CREATININE 1.63* 07/04/2014 1117   CREATININE 1.65* 05/20/2013 0508   CALCIUM 8.3* 07/04/2014 1117   CALCIUM 7.9* 05/20/2013 0508   PROT 7.3 07/04/2014 1117   PROT 7.8 05/18/2013 2113   ALBUMIN 3.8 07/04/2014  1117   ALBUMIN 3.8 05/18/2013 2113   AST 14* 07/04/2014 1117   AST 29 05/18/2013 2113   ALT 11* 07/04/2014 1117   ALT 18 05/18/2013 2113   ALKPHOS 86 07/04/2014 1117   ALKPHOS 196* 05/18/2013 2113   BILITOT 0.5 07/04/2014 1117   GFRNONAA 38* 07/04/2014 1117   GFRNONAA 38* 05/20/2013 0508   GFRAA 44* 07/04/2014 1117   GFRAA 45* 05/20/2013 0508   Lab Results  Component Value Date   WBC 8.4 07/04/2014   NEUTROABS 5.5 07/04/2014   HGB 12.2 07/04/2014   HCT 37.5 07/04/2014   MCV 96.8 07/04/2014   PLT 179 07/04/2014     STUDIES: Ct Abdomen Pelvis W Contrast  06/25/2014   CLINICAL DATA:  44 year old female with abdominal pain, hypotension, diarrhea, weakness. Initial encounter.  EXAM: CT ABDOMEN AND PELVIS WITH CONTRAST  TECHNIQUE: Multidetector CT  imaging of the abdomen and pelvis was performed using the standard protocol following bolus administration of intravenous contrast.  CONTRAST:  139mL OMNIPAQUE IOHEXOL 300 MG/ML  SOLN  COMPARISON:  Noncontrast CT Abdomen and Pelvis 10/19/2012  FINDINGS: Stable cardiomegaly. No pericardial or pleural effusion. Negative lung bases, chronic scar or atelectasis in the right middle lobe.  No acute osseous abnormality identified.  Small volume free fluid in the pelvis as well as the lower small bowel mesentery a. fluid in the rectum. Decompressed sigmoid colon with suggestion of some mucosal hyper enhancement. Diminutive urinary bladder. Negative uterus and adnexa.  Indistinct appearance of the descending colon with wall thickening and mesenteric stranding. Mucosal hyper enhancement. The abnormal appearance continues into the transverse colon. The hepatic flexure is involved to a lesser extent, with indistinct wall thickening and mucosal hyper enhancement. The ascending colon also is affected. The appendix remains within normal limits. No dilated small bowel, however, there is some distal small bowel mucosal hyper enhancement. The stomach is distended with  oral contrast, questionable gastric antral thickening. The duodenum bulb appears to remain normal. The remaining duodenum is decompressed.  Liver, gallbladder, spleen, pancreas and adrenal glands are within normal limits. Portal venous system is patent. Aortoiliac calcified atherosclerosis noted. Major arterial structures are patent. No abdominal free fluid or free air. No lymphadenopathy. Renal cortical scarring intermittently noted bilaterally. Renal contrast enhancement and contrast excretion otherwise appears symmetric.  IMPRESSION: 1. Diffuse colitis. Mural edema suspicious for pseudomembranous colitis. Small volume free fluid, no pneumoperitoneum. 2. There is also evidence of distal small bowel mucosal hyper enhancement also suggesting infectious/inflammatory involvement. 3. Age advanced aortoiliac soft and calcified atherosclerotic plaque. Chronic renal cortical scarring.   Electronically Signed   By: Genevie Ann M.D.   On: 06/25/2014 09:41   12/29/12 - MRI Brain. IMPRESSION:  Remote moderate size left occipital/posterior inferior left temporal lobe infarct with small area of involvement of the inferior left parietal lobe. Associated encephalomalacia and laminar necrosis. No evidence of acute infarct. Remote small left frontal lobe infarct. Mild to moderate nonspecific white matter type changes which in the present clinical setting is suspicious for result of small vessel disease. Other causes of white matter type changes not entirely excluded include that secondary to; vasculitis, inflammatory process, demyelinating process, migraine headaches or result of prior trauma/infection. Global atrophy without hydrocephalus. Mucosal thickening frontal sinuses and ethmoid sinus air cells with minimal mucosal thickening right maxillary sinus and partial opacification inferior aspect right mastoid air cells. Prominence of soft tissue in the posterior superior nasopharynx may represent adenoidal tissue more than expected  for the patient's age. Mucosal abnormality not excluded. Appearance without change.  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 (06/26/14 - anticardiolipin antibody G//M, lupus anticoagulate, beta-2 glycoprotein 1, factor V Leiden unremarkable. Protein S activity 64% (range 60-145), protein S total 77 (range 58 150), protein C activity 80% (range 74-151), protein C total 47 (range 70-140), anti-thrombin activity 71% (range 75-120). - Reviewed recent labs and discussed with patient. She has prior history of subendocardial MI, feels that she had CVA 2 but is unclear about episode in 2009. MRI of the brain  in December 2014 had reported remote left-sided infarct. The patient has upcoming appointment with neurologist, we will wait to see their opinion regarding about history of stroke. Per discussion with GI Dr. 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 In addition, patient states that she has significant GI bleeds and is hesitant to take any kind of anticoagulation. Recent hypercoagulable workup was remarkable for slightly low protein C total level at 47 and anti-thrombin activity just below range at 71%, unclear as to the significance of these findings since she was acutely sick at the time. Plan differential was continued monitoring, platelet repeat labs on August 15 including CBC, creatinine, ANA to rule out connective tissue disorder, serum protein electrophoresis, repeat protein C total and activity,  and anti-thrombin. Will see her back at 1 week after these labs are drawn and make further plan of management. Patient also strongly advised to completely quit smoking since this would increase risk of heart disease, stroke and thromboembolic phenomena, she does not want to try patches of medication and states that she will think about this. Smoking cessation info has been provided.     In between visits, the patient has been advised to call or come to the ER in case of fevers, chills, bleeding, acute sickness, or new symptoms. Patient agreeable to this plan.    Leia Alf, MD   07/22/2014 8:19 PM

## 2014-09-02 ENCOUNTER — Inpatient Hospital Stay: Payer: Self-pay | Attending: Oncology

## 2014-09-02 DIAGNOSIS — D529 Folate deficiency anemia, unspecified: Secondary | ICD-10-CM | POA: Insufficient documentation

## 2014-09-02 DIAGNOSIS — K529 Noninfective gastroenteritis and colitis, unspecified: Secondary | ICD-10-CM

## 2014-09-02 DIAGNOSIS — R7982 Elevated C-reactive protein (CRP): Secondary | ICD-10-CM | POA: Insufficient documentation

## 2014-09-02 DIAGNOSIS — D558 Other anemias due to enzyme disorders: Secondary | ICD-10-CM | POA: Insufficient documentation

## 2014-09-02 DIAGNOSIS — K559 Vascular disorder of intestine, unspecified: Secondary | ICD-10-CM | POA: Insufficient documentation

## 2014-09-02 LAB — CREATININE, SERUM
Creatinine, Ser: 1.65 mg/dL — ABNORMAL HIGH (ref 0.44–1.00)
GFR, EST AFRICAN AMERICAN: 44 mL/min — AB (ref >60)
GFR, EST NON AFRICAN AMERICAN: 38 mL/min — AB (ref >60)

## 2014-09-02 LAB — CBC WITH DIFFERENTIAL/PLATELET
BASOS ABS: 0 10*3/uL (ref 0–0.1)
Basophils Relative: 1 %
EOS PCT: 4 %
Eosinophils Absolute: 0.3 10*3/uL (ref 0–0.7)
HCT: 35.3 % (ref 35.0–47.0)
Hemoglobin: 11.8 g/dL — ABNORMAL LOW (ref 12.0–16.0)
Lymphocytes Relative: 28 %
Lymphs Abs: 2 10*3/uL (ref 1.0–3.6)
MCH: 31.9 pg (ref 26.0–34.0)
MCHC: 33.4 g/dL (ref 32.0–36.0)
MCV: 95.5 fL (ref 80.0–100.0)
Monocytes Absolute: 0.5 10*3/uL (ref 0.2–0.9)
Monocytes Relative: 7 %
NEUTROS PCT: 60 %
Neutro Abs: 4.4 10*3/uL (ref 1.4–6.5)
PLATELETS: 172 10*3/uL (ref 150–440)
RBC: 3.7 MIL/uL — AB (ref 3.80–5.20)
RDW: 13.3 % (ref 11.5–14.5)
WBC: 7.2 10*3/uL (ref 3.6–11.0)

## 2014-09-03 LAB — PROTEIN ELECTROPHORESIS, SERUM
A/G Ratio: 1.2 (ref 0.7–1.7)
ALPHA-1-GLOBULIN: 0.2 g/dL (ref 0.0–0.4)
ALPHA-2-GLOBULIN: 0.7 g/dL (ref 0.4–1.0)
Albumin ELP: 3.5 g/dL (ref 2.9–4.4)
BETA GLOBULIN: 1 g/dL (ref 0.7–1.3)
GLOBULIN, TOTAL: 3 g/dL (ref 2.2–3.9)
Gamma Globulin: 1.2 g/dL (ref 0.4–1.8)
Total Protein ELP: 6.5 g/dL (ref 6.0–8.5)

## 2014-09-03 LAB — ANA W/REFLEX IF POSITIVE: Anti Nuclear Antibody(ANA): NEGATIVE

## 2014-09-05 LAB — PROTEIN C ACTIVITY: PROTEIN C ACTIVITY: 68 % — AB (ref 74–151)

## 2014-09-05 LAB — ANTITHROMBIN PANEL
AT III AG PPP IMM-ACNC: 91 % (ref 75–130)
Antithrombin Activity: 88 % (ref 75–135)

## 2014-09-05 LAB — PROTEIN C, TOTAL: PROTEIN C, TOTAL: 72 % (ref 70–140)

## 2014-09-10 ENCOUNTER — Inpatient Hospital Stay (HOSPITAL_BASED_OUTPATIENT_CLINIC_OR_DEPARTMENT_OTHER): Payer: Self-pay | Admitting: Oncology

## 2014-09-10 ENCOUNTER — Ambulatory Visit (HOSPITAL_BASED_OUTPATIENT_CLINIC_OR_DEPARTMENT_OTHER): Payer: Self-pay | Admitting: Internal Medicine

## 2014-09-10 VITALS — BP 135/85 | HR 51 | Temp 96.6°F | Wt 148.5 lb

## 2014-09-10 DIAGNOSIS — Z7982 Long term (current) use of aspirin: Secondary | ICD-10-CM

## 2014-09-10 DIAGNOSIS — K559 Vascular disorder of intestine, unspecified: Secondary | ICD-10-CM | POA: Insufficient documentation

## 2014-09-10 DIAGNOSIS — Z8719 Personal history of other diseases of the digestive system: Secondary | ICD-10-CM | POA: Insufficient documentation

## 2014-09-10 DIAGNOSIS — F17210 Nicotine dependence, cigarettes, uncomplicated: Secondary | ICD-10-CM | POA: Insufficient documentation

## 2014-09-10 DIAGNOSIS — I10 Essential (primary) hypertension: Secondary | ICD-10-CM | POA: Insufficient documentation

## 2014-09-10 DIAGNOSIS — Z79899 Other long term (current) drug therapy: Secondary | ICD-10-CM | POA: Insufficient documentation

## 2014-09-10 DIAGNOSIS — Z8673 Personal history of transient ischemic attack (TIA), and cerebral infarction without residual deficits: Secondary | ICD-10-CM | POA: Insufficient documentation

## 2014-09-10 DIAGNOSIS — D6859 Other primary thrombophilia: Secondary | ICD-10-CM

## 2014-09-10 DIAGNOSIS — K219 Gastro-esophageal reflux disease without esophagitis: Secondary | ICD-10-CM | POA: Insufficient documentation

## 2014-09-10 DIAGNOSIS — I252 Old myocardial infarction: Secondary | ICD-10-CM

## 2014-09-10 NOTE — Progress Notes (Signed)
Patient does not have living will.  Currently smokes every day.

## 2014-09-12 ENCOUNTER — Telehealth: Payer: Self-pay | Admitting: *Deleted

## 2014-09-12 NOTE — Telephone Encounter (Signed)
Dr. Ma Hillock had asked if I could check out cost for pt to see dermatology and she is self pay.  I called [facility] and had rtn call from billing and the standard first appt  Is $103.26 and 1/2 of it would be due at time of first visit and they would work out payments for the rest.  I called Gabrielle Schultz to get an appt for establish PCP and the first available is October 19 1pm.  I gave pt info , address, telephone and self pay.  The policy for them is the pt must call at least 1 week before appt so that pt understands the rules and cost of visit.  i called pt and gave her above info.  Patient states that she is going to see [facility] next month and she said that if dr pandit rec: going to dermatology then they would refer her to one but pt could not afford one herself so she will wait till she see [facility].  She will take the appt for [facility] and she already knows the rules and the phone number because her son goes there.  i did tell her that she still must call or a week before the appt it will be cancelled and she states she will call them.  When i get note dictated I will fax info to their office. The fax is [phone].

## 2014-09-13 NOTE — Progress Notes (Signed)
Patient was seen by Dr. Ma Hillock

## 2014-09-14 ENCOUNTER — Encounter: Payer: Self-pay | Admitting: Oncology

## 2014-09-26 NOTE — Progress Notes (Signed)
[place]  Telephone:(336) [phone] Fax:(336) [phone]     ID: [name] OB: October 26, 1970  MR#: [id number]  [id number]  Patient Care Team: No Pcp Per Patient as PCP - General (General Practice)  CHIEF COMPLAINT/DIAGNOSIS:  History of recurrent ischemic colitis, recent hospitalization June 2016 (colonoscopy on 06/26/2014-polyps and proximal ascending colon. Segmental moderate inflammation in descending and sigmoid colon from 45 cm to 15 cm. Rectum spared. Suggestive of ischemic colitis. 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  (06/26/14 - anticardiolipin antibody G//M, lupus anticoagulant, beta-2 glycoprotein 1, factor V Leiden unremarkable. Protein S activity 64% (range 60-145), protein S total 77 (range 58 150), protein C activity 80% (range 74-151), protein C total 47 (range 70-140), anti-thrombin activity 71% (range 75-120).  09/02/14 - protein C total is 72 (was 47 before), protein C activity 68% (ref range 74.151), anti-thrombin activity 88 (was 71 before), anti-thrombin antigen 91% (ref range 75-130). Platelets 172 (was 117), hemoglobin 11.8, WBC 7.2, neutrophils 60%. Serum Cr 1.65 (stable), SPEP negative for M spike, ANA negative.   HISTORY OF PRESENT ILLNESS:  Patient does have continued follow-up. States that she is doing fairly steady. Denies any recurrent abdominal pain, sickness of hospitalization. Eating better. Denies history of DVT or pulmonary embolism. No new bone pains. No new paresthesias in the extremities.  REVIEW OF SYSTEMS:   ROS As in HPI above. In addition, no fever, chills or sweats. No new headaches or focal weakness.  No sore throat or dysphagia. Denies cough, shortness of breath, sputum,  hemoptysis or chest pain. No dizziness or palpitation. No dysuria or hematuria. No new skin rash.    PAST MEDICAL HISTORY: Reviewed. Past Medical History  Diagnosis Date  . History of syncope   . Acid reflux   . Carpal tunnel syndrome     right hand  . Hypertension 2008  . Syncope and collapse   . Poor circulation   . Acid reflux   . Migraines   . Colitis   . MI (myocardial infarction)     2015  . CVA (cerebral infarction)     2009, 2015    PAST SURGICAL HISTORY: Reviewed. Past Surgical History  Procedure Laterality Date  . Cesarean section  2004  . Colonoscopy with propofol N/A 06/26/2014    Procedure: COLONOSCOPY WITH PROPOFOL;  Surgeon: Josefine Class, MD;  Location: [facility] ENDOSCOPY;  Service: Endoscopy;  Laterality: N/A;    FAMILY HISTORY: Reviewed. Family History  Problem Relation Age of Onset  . Hyperlipidemia Mother   . Heart disease Mother   . Hypertension Father   . Heart disease Father   . Hyperlipidemia Father   . Hypertension Sister     SOCIAL HISTORY: Reviewed. Social History  Substance Use Topics  . Smoking status: Current Every Day Smoker -- 0.50 packs/day for 18 years    Types: Cigarettes  . Smokeless tobacco: Not on file  . Alcohol Use: Yes     Comment: occasionally    Allergies  Allergen Reactions  . Banana   . Coconut Oil   . Strawberry     Current Outpatient Prescriptions  Medication Sig Dispense Refill  . amLODipine (NORVASC) 5 MG tablet Take 5 mg by mouth daily.    Marland Kitchen aspirin 81 MG tablet Take 81 mg by mouth  daily.    . cloNIDine (CATAPRES) 0.1 MG tablet Take 1 tablet (0.1 mg total) by mouth 2 (two) times daily. 60 tablet 11  . lisinopril (PRINIVIL,ZESTRIL) 20 MG tablet Take 20 mg by mouth daily.    . nicotine (NICODERM CQ - DOSED IN MG/24 HOURS) 21 mg/24hr patch Place 1 patch onto the skin daily.    . ranitidine (ZANTAC) 150 MG tablet Take 150 mg by mouth 2 (two) times daily.     . simvastatin (ZOCOR) 20 MG tablet Take 20 mg by  mouth daily.     No current facility-administered medications for this visit.    PHYSICAL EXAM: GENERAL: Alert and oriented and in no acute distress. No icterus. HEENT: EOMs intact. No cervical lymphadenopathy. CVS: S1S2, regular LUNGS: Bilaterally clear to auscultation, no rhonchi. ABDOMEN: Soft, nontender.   EXTREMITIES: No pedal edema.  LAB RESULTS:    Component Value Date/Time   NA 132* 07/04/2014 1117   NA 132* 05/20/2013 0508   K 4.7 07/04/2014 1117   K 3.9 05/20/2013 0508   CL 107 07/04/2014 1117   CL 107 05/20/2013 0508   CO2 21* 07/04/2014 1117   CO2 21 05/20/2013 0508   GLUCOSE 88 07/04/2014 1117   GLUCOSE 91 05/20/2013 0508   BUN 11 07/04/2014 1117   BUN 16 05/20/2013 0508   CREATININE 1.65* 09/02/2014 1132   CREATININE 1.65* 05/20/2013 0508   CALCIUM 8.3* 07/04/2014 1117   CALCIUM 7.9* 05/20/2013 0508   PROT 7.3 07/04/2014 1117   PROT 7.8 05/18/2013 2113   ALBUMIN 3.8 07/04/2014 1117   ALBUMIN 3.8 05/18/2013 2113   AST 14* 07/04/2014 1117   AST 29 05/18/2013 2113   ALT 11* 07/04/2014 1117   ALT 18 05/18/2013 2113   ALKPHOS 86 07/04/2014 1117   ALKPHOS 196* 05/18/2013 2113   BILITOT 0.5 07/04/2014 1117   BILITOT 0.5 05/18/2013 2113   GFRNONAA 38* 09/02/2014 1132   GFRNONAA 38* 05/20/2013 0508   GFRAA 44* 09/02/2014 1132   GFRAA 45* 05/20/2013 0508   Lab Results  Component Value Date   WBC 7.2 09/02/2014   NEUTROABS 4.4 09/02/2014   HGB 11.8* 09/02/2014   HCT 35.3 09/02/2014   MCV 95.5 09/02/2014   PLT 172 09/02/2014     STUDIES: 12/29/12 - MRI Brain. IMPRESSION:  Remote moderate size left occipital/posterior inferior left temporal lobe infarct with small area of involvement of the inferior left parietal lobe. Associated encephalomalacia and laminar necrosis. No evidence of acute infarct. Remote small left frontal lobe infarct. Mild to moderate nonspecific white matter type changes which in the present clinical setting is suspicious for result of  small vessel disease. Other causes of white matter type changes not entirely excluded include that secondary to; vasculitis, inflammatory process, demyelinating process, migraine headaches or result of prior trauma/infection. Global atrophy without hydrocephalus. Mucosal thickening frontal sinuses and ethmoid sinus air cells with minimal mucosal thickening right maxillary sinus and partial opacification inferior aspect right mastoid air cells. Prominence of soft tissue in the posterior superior nasopharynx may represent adenoidal tissue more than expected for the patient's age. Mucosal abnormality not excluded. Appearance without change.   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 (06/26/14 - anticardiolipin antibody G//M, lupus anticoagulate, beta-2 glycoprotein 1, factor V Leiden unremarkable. Protein S activity 64% (range 60-145), protein S total 77 (range 58 150), protein C activity 80% (range 74-151), protein C total 47 (range 70-140), anti-thrombin activity 71% (range 75-120).  On 09/02/14 - protein C total is 72 (was 47 before), protein C activity 68% (ref range 74.151), anti-thrombin activity 88 (was 71 before), anti-thrombin antigen 91% (ref range 75-130). Platelets 172 (was 117), hemoglobin 11.8, WBC 7.2, neutrophils 60%. Serum Cr 1.65 (stable), SPEP negative for M spike, ANA negative - reviewed recent labs and d/w patient. She has prior history of subendocardial MI, feels that she had CVA 2 but is unclear about episode in 2009. MRI of the brain in December 2014 had reported remote left-sided infarct. The  patient has upcoming appointment with neurologist, we will wait to see their opinion regarding about history of stroke. Per prior discussion with GI Dr. 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 In addition, patient states that she has significant GI bleeds and is hesitant to take any kind of anticoagulation. Repeat workup done on August 29 shows improvement in previously noted slightly low protein C total level and anti-thrombin activity. Mild thrombocytopenia also has improved. Given this, do not to the need for continued hematology follow-up and she is being discharged from our clinic. States that she follows with nephrology for abnormal creatinine. Patient plans to continue to follow with primary physician and GI, I would be happy to see her back in the future if any hematology issues or questions should arise. Patient also strongly advised to completely quit smoking since this would increase risk of heart disease, stroke and thromboembolic phenomena, she does not want to try patches of medication and states that she will think about this. Smoking cessation info has been provided.     In between visits, the patient has been advised to call MD or come to the ER in case of fevers, chills, bleeding, acute sickness or new symptoms. She is agreeable to this plan.   Leia Alf, MD   09/26/2014 3:28 PM

## 2014-10-31 ENCOUNTER — Ambulatory Visit: Payer: Self-pay

## 2014-12-21 ENCOUNTER — Emergency Department
Admission: EM | Admit: 2014-12-21 | Discharge: 2014-12-21 | Disposition: A | Discharge status: Home / Self Care | Payer: Self-pay | Attending: Emergency Medicine | Admitting: Emergency Medicine

## 2014-12-21 ENCOUNTER — Encounter: Payer: Self-pay | Admitting: *Deleted

## 2014-12-21 DIAGNOSIS — F17210 Nicotine dependence, cigarettes, uncomplicated: Secondary | ICD-10-CM | POA: Insufficient documentation

## 2014-12-21 DIAGNOSIS — Z79899 Other long term (current) drug therapy: Secondary | ICD-10-CM | POA: Insufficient documentation

## 2014-12-21 DIAGNOSIS — N183 Chronic kidney disease, stage 3 (moderate): Secondary | ICD-10-CM | POA: Insufficient documentation

## 2014-12-21 DIAGNOSIS — Z880 Allergy status to penicillin: Secondary | ICD-10-CM | POA: Insufficient documentation

## 2014-12-21 DIAGNOSIS — J157 Pneumonia due to Mycoplasma pneumoniae: Secondary | ICD-10-CM | POA: Insufficient documentation

## 2014-12-21 DIAGNOSIS — Z7982 Long term (current) use of aspirin: Secondary | ICD-10-CM | POA: Insufficient documentation

## 2014-12-21 DIAGNOSIS — I129 Hypertensive chronic kidney disease with stage 1 through stage 4 chronic kidney disease, or unspecified chronic kidney disease: Secondary | ICD-10-CM | POA: Insufficient documentation

## 2014-12-21 MED ORDER — AZITHROMYCIN 250 MG PO TABS: ORAL_TABLET | ORAL | Status: DC

## 2014-12-21 MED ORDER — PREDNISONE 10 MG PO TABS: 10.0000 mg | ORAL_TABLET | ORAL | Status: DC

## 2014-12-21 MED ORDER — CETIRIZINE HCL 10 MG PO TABS: 10.0000 mg | ORAL_TABLET | Freq: Every day | ORAL | Status: DC

## 2014-12-21 MED ORDER — FLUTICASONE PROPIONATE 50 MCG/ACT NA SUSP: 1.0000 | Freq: Two times a day (BID) | NASAL | Status: DC

## 2014-12-21 MED ORDER — ALBUTEROL SULFATE HFA 108 (90 BASE) MCG/ACT IN AERS: 2.0000 | INHALATION_SPRAY | RESPIRATORY_TRACT | Status: DC | PRN

## 2014-12-21 NOTE — ED Notes (Signed)
Cold symptoms x 1 month. States over the counters not working. Stuffy nose, cough, smoker.

## 2014-12-21 NOTE — ED Provider Notes (Signed)
[facility] Emergency Department Provider Note  ____________________________________________  Time seen: Approximately 9:37 PM  I have reviewed the triage vital signs and the nursing notes.   HISTORY  Chief Complaint Cough; Nasal Congestion; and Generalized Body Aches    HPI Gabrielle Schultz is a 44 y.o. female who presents emergency department for green 1 month of cold like symptoms. She states that she has had a cough that has persisted for over a month. She has intermittent nasal congestion and mild sore throat. She states that she is able to continue her normal daily activity but just cannot clear the symptoms. Patient is tried multiple over-the-counter medications with no relief. Patient denies any headache, visual acuity changes, neck pain, chest pain, shortness of breath, abdominal pain, nausea or vomiting.   Past Medical History  Diagnosis Date  . History of syncope   . Acid reflux   . Carpal tunnel syndrome     right hand  . Hypertension 2008  . Syncope and collapse   . Poor circulation   . Acid reflux   . Migraines   . Colitis   . MI (myocardial infarction) ([place])     2015  . CVA (cerebral infarction)     2009, 2015    Patient Active Problem List   Diagnosis Date Noted  . Acute colitis 06/25/2014  . High potassium 03/25/2014  . Cerebral infarct ([place]) 11/21/2013  . Chronic kidney disease (CKD), stage III (moderate) 11/21/2013  . Hypertension 08/12/2011  . LVH (left ventricular hypertrophy) 08/12/2011  . Smoking addiction 08/12/2011  . Chronic renal insufficiency 08/12/2011    Past Surgical History  Procedure Laterality Date  . Cesarean section  2004  . Colonoscopy with propofol N/A 06/26/2014    Procedure: COLONOSCOPY WITH PROPOFOL;  Surgeon: Josefine Class, MD;  Location: [facility] ENDOSCOPY;  Service: Endoscopy;  Laterality: N/A;    Current Outpatient Rx  Name  Route  Sig  Dispense  Refill  . albuterol (PROVENTIL HFA;VENTOLIN  HFA) 108 (90 BASE) MCG/ACT inhaler   Inhalation   Inhale 2 puffs into the lungs every 4 (four) hours as needed for wheezing or shortness of breath.   1 Inhaler   0   . amLODipine (NORVASC) 5 MG tablet   Oral   Take 5 mg by mouth daily.         Marland Kitchen aspirin 81 MG tablet   Oral   Take 81 mg by mouth daily.         Marland Kitchen azithromycin (ZITHROMAX Z-PAK) 250 MG tablet      Take 2 tablets (500 mg) on  Day 1,  followed by 1 tablet (250 mg) once daily on Days 2 through 5.   6 each   0   . cetirizine (ZYRTEC) 10 MG tablet   Oral   Take 1 tablet (10 mg total) by mouth daily.   30 tablet   0   . cloNIDine (CATAPRES) 0.1 MG tablet   Oral   Take 1 tablet (0.1 mg total) by mouth 2 (two) times daily.   60 tablet   11   . fluticasone (FLONASE) 50 MCG/ACT nasal spray   Each Nare   Place 1 spray into both nostrils 2 (two) times daily.   16 g   0   . lisinopril (PRINIVIL,ZESTRIL) 20 MG tablet   Oral   Take 20 mg by mouth daily.         . nicotine (NICODERM CQ - DOSED IN MG/24  HOURS) 21 mg/24hr patch   Transdermal   Place 1 patch onto the skin daily.         . predniSONE (DELTASONE) 10 MG tablet   Oral   Take 1 tablet (10 mg total) by mouth as directed.   21 tablet   0     Take on a daily basis of 6, 5, 4, 3, 2, 1   . ranitidine (ZANTAC) 150 MG tablet   Oral   Take 150 mg by mouth 2 (two) times daily.          . simvastatin (ZOCOR) 20 MG tablet   Oral   Take 20 mg by mouth daily.           Allergies Banana; Coconut oil; Penicillins; and Strawberry extract  Family History  Problem Relation Age of Onset  . Hyperlipidemia Mother   . Heart disease Mother   . Hypertension Father   . Heart disease Father   . Hyperlipidemia Father   . Hypertension Sister     Social History Social History  Substance Use Topics  . Smoking status: Current Every Day Smoker -- 0.50 packs/day for 18 years    Types: Cigarettes  . Smokeless tobacco: None  . Alcohol Use: Yes      Comment: occasionally    Review of Systems Constitutional: No fever/chills Eyes: No visual changes. ENT: No sore throat. Nurse's mild nasal congestion. Cardiovascular: Denies chest pain. Respiratory: Denies shortness of breath. Endorses cough. Gastrointestinal: No abdominal pain.  No nausea, no vomiting.  No diarrhea.  No constipation. Genitourinary: Negative for dysuria. Musculoskeletal: Negative for back pain. Skin: Negative for rash. Neurological: Negative for headaches, focal weakness or numbness.  10-point ROS otherwise negative.  ____________________________________________   PHYSICAL EXAM:  VITAL SIGNS: ED Triage Vitals  Enc Vitals Group     BP 12/21/14 1843 190/84 mmHg     Pulse Rate 12/21/14 1843 91     Resp 12/21/14 1843 18     Temp 12/21/14 1843 98.3 F (36.8 C)     Temp Source 12/21/14 1843 Oral     SpO2 12/21/14 1843 100 %     Weight 12/21/14 1843 150 lb (68.04 kg)     Height 12/21/14 1843 5\' 1"  (1.549 m)     Head Cir --      Peak Flow --      Pain Score 12/21/14 1844 0     Pain Loc --      Pain Edu? --      Excl. in [place]? --     Constitutional: Alert and oriented. Well appearing and in no acute distress. Eyes: Conjunctivae are normal. PERRL. EOMI. Head: Atraumatic. Nose: Mild clear congestion/rhinnorhea. Mouth/Throat: Mucous membranes are moist.  Oropharynx non-erythematous. Neck: No stridor.   Cardiovascular: Normal rate, regular rhythm. Grossly normal heart sounds.  Good peripheral circulation. Respiratory: Normal respiratory effort.  No retractions. Lungs with scattered coarse breath sounds with occasional wheezing. Gastrointestinal: Soft and nontender. No distention. No abdominal bruits. No CVA tenderness. Musculoskeletal: No lower extremity tenderness nor edema.  No joint effusions. Neurologic:  Normal speech and language. No gross focal neurologic deficits are appreciated. No gait instability. Skin:  Skin is warm, dry and intact. No rash  noted. Psychiatric: Mood and affect are normal. Speech and behavior are normal.  ____________________________________________   LABS (all labs ordered are listed, but only abnormal results are displayed)  Labs Reviewed - No data to display ____________________________________________  EKG   ____________________________________________  RADIOLOGY  ____________________________________________   PROCEDURES  Procedure(s) performed: None  Critical Care performed: No  ____________________________________________   INITIAL IMPRESSION / ASSESSMENT AND PLAN / ED COURSE  Pertinent labs & imaging results that were available during my care of the patient were reviewed by me and considered in my medical decision making (see chart for details).  She is diagnosis consistent with a classic pneumonia. Patient will be placed on azithromycin, Flonase, Zyrtec, steroid taper, albuterol inhaler. Patient is to follow-up with primary care should symptoms persist past treatment course. Patient verbalizes understanding of diagnosis and treatment plan and verbalizes compliance with same.   New Prescriptions   ALBUTEROL (PROVENTIL HFA;VENTOLIN HFA) 108 (90 BASE) MCG/ACT INHALER    Inhale 2 puffs into the lungs every 4 (four) hours as needed for wheezing or shortness of breath.   AZITHROMYCIN (ZITHROMAX Z-PAK) 250 MG TABLET    Take 2 tablets (500 mg) on  Day 1,  followed by 1 tablet (250 mg) once daily on Days 2 through 5.   CETIRIZINE (ZYRTEC) 10 MG TABLET    Take 1 tablet (10 mg total) by mouth daily.   FLUTICASONE (FLONASE) 50 MCG/ACT NASAL SPRAY    Place 1 spray into both nostrils 2 (two) times daily.   PREDNISONE (DELTASONE) 10 MG TABLET    Take 1 tablet (10 mg total) by mouth as directed.    ____________________________________________   FINAL CLINICAL IMPRESSION(S) / ED DIAGNOSES  Final diagnoses:  Mycoplasma pneumonia      Darletta Moll, PA-C 12/21/14 2149  Nena Polio, MD 12/22/14 [phone]

## 2014-12-21 NOTE — ED Notes (Signed)
Pt states cough, congestion, and body aches for 1 month, states everything she takes otc "interacts with her medication"

## 2014-12-21 NOTE — Discharge Instructions (Signed)

## 2014-12-25 ENCOUNTER — Ambulatory Visit: Payer: Self-pay | Admitting: Internal Medicine

## 2015-01-23 ENCOUNTER — Other Ambulatory Visit: Payer: Self-pay

## 2015-01-23 LAB — HEPATIC FUNCTION PANEL
ALK PHOS: 105 U/L (ref 25–125)
ALT: 7 U/L (ref 7–35)
AST: 11 U/L — AB (ref 13–35)
BILIRUBIN, TOTAL: 0.3 mg/dL

## 2015-01-23 LAB — BASIC METABOLIC PANEL
BUN: 23 mg/dL — AB (ref 4–21)
Creatinine: 1.6 mg/dL — AB (ref 0.5–1.1)
Glucose: 95 mg/dL
Potassium: 5.2 mmol/L (ref 3.4–5.3)
Sodium: 142 mmol/L (ref 137–147)

## 2015-01-23 LAB — TSH: TSH: 0.76 u[IU]/mL (ref 0.41–5.90)

## 2015-01-23 LAB — CBC AND DIFFERENTIAL
HCT: 37 % (ref 36–46)
Hemoglobin: 12.4 g/dL (ref 12.0–16.0)
NEUTROS ABS: 5 /uL
PLATELETS: 232 10*3/uL (ref 150–399)
WBC: 8.7 10^3/mL

## 2015-01-23 LAB — LIPID PANEL
Cholesterol: 116 mg/dL (ref 0–200)
HDL: 32 mg/dL — AB (ref 35–70)
LDL CALC: 69 mg/dL
TRIGLYCERIDES: 75 mg/dL (ref 40–160)

## 2015-01-28 ENCOUNTER — Ambulatory Visit: Payer: Self-pay

## 2015-02-05 ENCOUNTER — Other Ambulatory Visit: Payer: Self-pay | Admitting: Urology

## 2015-02-05 DIAGNOSIS — J189 Pneumonia, unspecified organism: Secondary | ICD-10-CM

## 2015-02-05 DIAGNOSIS — R079 Chest pain, unspecified: Secondary | ICD-10-CM

## 2015-02-06 ENCOUNTER — Ambulatory Visit: Payer: Self-pay

## 2015-02-06 ENCOUNTER — Encounter (INDEPENDENT_AMBULATORY_CARE_PROVIDER_SITE_OTHER): Payer: Self-pay

## 2015-02-07 ENCOUNTER — Ambulatory Visit
Admission: RE | Admit: 2015-02-07 | Discharge: 2015-02-07 | Disposition: A | Discharge status: Home / Self Care | Payer: Self-pay | Source: Ambulatory Visit | Attending: Urology | Admitting: Urology

## 2015-02-07 DIAGNOSIS — R079 Chest pain, unspecified: Secondary | ICD-10-CM | POA: Insufficient documentation

## 2015-02-07 DIAGNOSIS — R05 Cough: Secondary | ICD-10-CM | POA: Insufficient documentation

## 2015-02-07 DIAGNOSIS — J189 Pneumonia, unspecified organism: Secondary | ICD-10-CM

## 2015-02-07 LAB — EXERCISE TOLERANCE TEST
CHL CUP RESTING HR STRESS: 83 {beats}/min
CSEPED: 5 min
CSEPEDS: 1 s
Estimated workload: 7 METS
Peak HR: 162 {beats}/min

## 2015-02-25 ENCOUNTER — Ambulatory Visit: Payer: Self-pay

## 2015-02-27 ENCOUNTER — Ambulatory Visit: Payer: Self-pay

## 2015-03-13 ENCOUNTER — Ambulatory Visit: Payer: Self-pay

## 2015-04-03 ENCOUNTER — Ambulatory Visit: Payer: Self-pay

## 2015-05-26 ENCOUNTER — Other Ambulatory Visit: Payer: Self-pay | Admitting: Urology

## 2015-05-26 DIAGNOSIS — I10 Essential (primary) hypertension: Secondary | ICD-10-CM

## 2015-05-27 MED ORDER — CLONIDINE HCL 0.1 MG PO TABS: 0.1000 mg | ORAL_TABLET | Freq: Two times a day (BID) | ORAL | Status: DC

## 2015-05-29 ENCOUNTER — Other Ambulatory Visit: Payer: Self-pay

## 2015-05-29 DIAGNOSIS — N189 Chronic kidney disease, unspecified: Secondary | ICD-10-CM

## 2015-05-30 ENCOUNTER — Telehealth: Payer: Self-pay

## 2015-05-30 LAB — BASIC METABOLIC PANEL
BUN / CREAT RATIO: 11 (ref 9–23)
BUN: 15 mg/dL (ref 6–24)
CHLORIDE: 103 mmol/L (ref 96–106)
CO2: 19 mmol/L (ref 18–29)
Calcium: 8.9 mg/dL (ref 8.7–10.2)
Creatinine, Ser: 1.31 mg/dL — ABNORMAL HIGH (ref 0.57–1.00)
GFR calc non Af Amer: 50 mL/min/{1.73_m2} — ABNORMAL LOW (ref >59)
GFR, EST AFRICAN AMERICAN: 58 mL/min/{1.73_m2} — AB (ref >59)
Glucose: 86 mg/dL (ref 65–99)
Potassium: 5.1 mmol/L (ref 3.5–5.2)
Sodium: 138 mmol/L (ref 134–144)

## 2015-05-30 LAB — PROTEIN / CREATININE RATIO, URINE
Creatinine, Urine: 38.9 mg/dL
PROTEIN/CREAT RATIO: 496 mg/g{creat} — AB (ref 0–200)
Protein, Ur: 19.3 mg/dL

## 2015-05-30 NOTE — Telephone Encounter (Signed)
Explained to patient that [facility] providers are volunteers and there is a strict policy that providers do not fill out paperwork/forms or write letters for disability. Some providers are here only 4 times a year. Providers are here to diagnose and treat patients and do not have time to do paperwork.  Providers do not want to be called into court hearings, etc for disability, etc. I advised her to establish care with a local [facility] which has a sliding scale fee structure if she needs to have a disability determination.

## 2015-06-19 ENCOUNTER — Ambulatory Visit: Payer: Self-pay | Admitting: Ophthalmology

## 2015-06-26 ENCOUNTER — Encounter: Payer: Self-pay | Admitting: Family Medicine

## 2015-06-26 ENCOUNTER — Ambulatory Visit: Payer: Self-pay | Admitting: Family Medicine

## 2015-06-26 VITALS — BP 146/97 | HR 88 | Ht 61.0 in | Wt 152.0 lb

## 2015-06-26 DIAGNOSIS — J309 Allergic rhinitis, unspecified: Secondary | ICD-10-CM

## 2015-06-26 DIAGNOSIS — Z720 Tobacco use: Secondary | ICD-10-CM | POA: Insufficient documentation

## 2015-06-26 DIAGNOSIS — E786 Lipoprotein deficiency: Secondary | ICD-10-CM

## 2015-06-26 DIAGNOSIS — N183 Chronic kidney disease, stage 3 unspecified: Secondary | ICD-10-CM

## 2015-06-26 DIAGNOSIS — F17200 Nicotine dependence, unspecified, uncomplicated: Secondary | ICD-10-CM | POA: Insufficient documentation

## 2015-06-26 DIAGNOSIS — I10 Essential (primary) hypertension: Secondary | ICD-10-CM

## 2015-06-26 DIAGNOSIS — I517 Cardiomegaly: Secondary | ICD-10-CM

## 2015-06-26 HISTORY — DX: Allergic rhinitis, unspecified: J30.9

## 2015-06-26 HISTORY — DX: Lipoprotein deficiency: E78.6

## 2015-06-26 MED ORDER — NICOTINE 14 MG/24HR TD PT24: 14.0000 mg | MEDICATED_PATCH | Freq: Every day | TRANSDERMAL | Status: DC

## 2015-06-26 MED ORDER — CLONIDINE HCL 0.1 MG PO TABS: 0.1000 mg | ORAL_TABLET | Freq: Three times a day (TID) | ORAL | Status: DC

## 2015-06-26 MED ORDER — FLUTICASONE PROPIONATE 50 MCG/ACT NA SUSP: 1.0000 | Freq: Two times a day (BID) | NASAL | Status: DC

## 2015-06-26 MED ORDER — CETIRIZINE HCL 10 MG PO TABS: 10.0000 mg | ORAL_TABLET | Freq: Every day | ORAL | Status: DC

## 2015-06-26 NOTE — Assessment & Plan Note (Signed)
Smoking cessation encouraged!

## 2015-06-26 NOTE — Assessment & Plan Note (Signed)
Encouraged pt to quit; she requested refills of patch; I explained I'll prescribe lower dose; see AVS for info given

## 2015-06-26 NOTE — Progress Notes (Signed)
BP 146/97 mmHg  Pulse 88  Ht 5\' 1"  (1.549 m)  Wt 152 lb (68.947 kg)  BMI 28.74 kg/m2  SpO2 100%   Subjective:    Patient ID: Gabrielle Schultz, female    DOB: 1970-12-27, 45 y.o.   MRN: [id number]  HPI: Gabrielle Schultz is a 45 y.o. female  Chief Complaint  Patient presents with  . Hypertension   High blood pressure; numbers are about the same; runs in the family;  She does get swelling in the lower legs, worse with heat and on her feet She uses very little She knows to not use artificial stuff; uses garlic and parsley and lemon pepper She has CKD, stage 3, seeing specialist Dr. Johnney Ou; no NSAIDs but does use excedrin migraine  She had walking pneumonia; was treated with prednisone and zithromycin; I removed those from the med list  Low HDL; very strong fam hx of heart disease; on statin; no cheese, no eggs  She needs prescription for cigarette patch; she is ready to quit smoking; smoking since age 63; 1/2 ppd, no more no less; husband smokes; roommate smokes  Needs zyrtec; she has two cats and two dogs; does not use decongestants  Relevant past medical, surgical, family and social history reviewed Past Medical History  Diagnosis Date  . History of syncope   . Acid reflux   . Carpal tunnel syndrome     right hand  . Hypertension 2008  . Syncope and collapse   . Poor circulation   . Acid reflux   . Migraines   . Colitis   . MI (myocardial infarction) ([place])     2015  . CVA (cerebral infarction)     2009, 2015  . Stroke ([place])   . Chronic kidney disease   . Low HDL (under 40) 06/26/2015  . Allergic rhinitis 06/26/2015   Past Surgical History  Procedure Laterality Date  . Cesarean section  2004  . Colonoscopy with propofol N/A 06/26/2014    Procedure: COLONOSCOPY WITH PROPOFOL;  Surgeon: Josefine Class, MD;  Location: [facility] ENDOSCOPY;  Service: Endoscopy;  Laterality: N/A;   Family History  Problem Relation Age of Onset  . Hyperlipidemia Mother   . Heart  disease Mother   . Hypertension Father   . Heart disease Father   . Hyperlipidemia Father   . Hypertension Sister    Social History  Substance Use Topics  . Smoking status: Current Every Day Smoker -- 0.50 packs/day for 18 years    Types: Cigarettes  . Smokeless tobacco: None  . Alcohol Use: Yes     Comment: occasionally   Interim medical history since last visit reviewed. Allergies and medications reviewed  Review of Systems Per HPI unless specifically indicated above     Objective:    BP 146/97 mmHg  Pulse 88  Ht 5\' 1"  (1.549 m)  Wt 152 lb (68.947 kg)  BMI 28.74 kg/m2  SpO2 100%  Wt Readings from Last 3 Encounters:  06/26/15 152 lb (68.947 kg)  12/21/14 150 lb (68.04 kg)  09/10/14 148 lb 8 oz (67.359 kg)    Physical Exam  Constitutional: She appears well-developed and well-nourished. No distress.  HENT:  Head: Normocephalic and atraumatic.  Eyes: EOM are normal. No scleral icterus.  Neck: No thyromegaly present.  Cardiovascular: Normal rate, regular rhythm and normal heart sounds.   No murmur heard. Pulmonary/Chest: Effort normal and breath sounds normal. No respiratory distress. She has no wheezes.  Abdominal: Soft. Bowel  sounds are normal. She exhibits no distension.  Musculoskeletal: Normal range of motion. She exhibits no edema.  Neurological: She is alert. She exhibits normal muscle tone.  Skin: Skin is warm and dry. She is not diaphoretic. No pallor.  Psychiatric: She has a normal mood and affect. Her behavior is normal. Judgment and thought content normal.   Results for orders placed or performed in visit on [id number]  Basic Metabolic Panel (BMET)  Result Value Ref Range   Glucose 86 65 - 99 mg/dL   BUN 15 6 - 24 mg/dL   Creatinine, Ser 1.31 (H) 0.57 - 1.00 mg/dL   GFR calc non Af Amer 50 (L) >59 mL/min/1.73   GFR calc Af Amer 58 (L) >59 mL/min/1.73   BUN/Creatinine Ratio 11 9 - 23   Sodium 138 134 - 144 mmol/L   Potassium 5.1 3.5 - 5.2 mmol/L    Chloride 103 96 - 106 mmol/L   CO2 19 18 - 29 mmol/L   Calcium 8.9 8.7 - 10.2 mg/dL  Protein / Creatinine Ratio, Urine  Result Value Ref Range   Creatinine, Urine 38.9 Not Estab. mg/dL   Protein, Ur 19.3 Not Estab. mg/dL   Protein/Creat Ratio 496 (H) 0 - 200 mg/g creat      Assessment & Plan:   Problem List Items Addressed This Visit      Cardiovascular and Mediastinum   Hypertension - Primary    Try DASH guidelines; increase clonidine to TID; strongly encouraged her to quit smoking      Relevant Medications   cloNIDine (CATAPRES) 0.1 MG tablet   LVH (left ventricular hypertrophy)    Work to get pressures down; increase clonidine frequency to TID; recheck in 4 weeks; quit smoking      Relevant Medications   cloNIDine (CATAPRES) 0.1 MG tablet     Respiratory   Allergic rhinitis    Refills given of nasal spray and plain zyrtec given; avoid decongestants        Genitourinary   Chronic kidney disease (CKD), stage III (moderate)    Continue to follow-up with nephrologist [facility]); quit smoking; last Cr reviewed; normal K+; continue ACE-I        Other   Low HDL (under 40)    Smoking cessation encouraged      Tobacco abuse    Encouraged pt to quit; she requested refills of patch; I explained I'll prescribe lower dose; see AVS for info given      Relevant Medications   nicotine (NICODERM CQ - DOSED IN MG/24 HOURS) 14 mg/24hr patch      Follow up plan: Return in about 4 weeks (around 07/24/2015) for blood pressure.  An after-visit summary was printed and given to the patient at [place].  Please see the patient instructions which may contain other information and recommendations beyond what is mentioned above in the assessment and plan.  Meds ordered this encounter  Medications  . omeprazole (PRILOSEC) 40 MG capsule    Sig: Take 40 mg by mouth daily.  . cloNIDine (CATAPRES) 0.1 MG tablet    Sig: Take 1 tablet (0.1 mg total) by mouth 3 (three) times daily.     Dispense:  90 tablet    Refill:  2  . nicotine (NICODERM CQ - DOSED IN MG/24 HOURS) 14 mg/24hr patch    Sig: Place 1 patch (14 mg total) onto the skin daily.    Dispense:  28 patch    Refill:  0  . cetirizine (ZYRTEC) 10  MG tablet    Sig: Take 1 tablet (10 mg total) by mouth daily.    Dispense:  30 tablet    Refill:  11  . fluticasone (FLONASE) 50 MCG/ACT nasal spray    Sig: Place 1 spray into both nostrils 2 (two) times daily.    Dispense:  16 g    Refill:  11

## 2015-06-26 NOTE — Assessment & Plan Note (Signed)
Continue to follow-up with nephrologist [facility]); quit smoking; last Cr reviewed; normal K+; continue ACE-I

## 2015-06-26 NOTE — Assessment & Plan Note (Addendum)
Refills given of nasal spray and plain zyrtec given; avoid decongestants

## 2015-06-26 NOTE — Assessment & Plan Note (Signed)
Work to get pressures down; increase clonidine frequency to TID; recheck in 4 weeks; quit smoking

## 2015-06-26 NOTE — Assessment & Plan Note (Addendum)
Try DASH guidelines; increase clonidine to TID; strongly encouraged her to quit smoking

## 2015-06-26 NOTE — Patient Instructions (Addendum)
I do encourage you to quit smoking Call [phone] to sign up for smoking cessation classes You can call 1-800-QUIT-NOW to talk with a smoking cessation coach  Try to limit saturated fats in your diet (bologna, hot dogs, barbeque, cheeseburgers, hamburgers, steak, bacon, sausage, cheese, etc.) and get more fresh fruits, vegetables, and whole grains  Your goal blood pressure is less than 140 mmHg on top and under 90 on the bottom Try to follow the DASH guidelines (DASH stands for Dietary Approaches to Stop Hypertension) Try to limit the sodium in your diet.  Ideally, consume less than 1.5 grams (less than 1,500mg ) per day. Do not add salt when cooking or at the table.  Check the sodium amount on labels when shopping, and choose items lower in sodium when given a choice. Avoid or limit foods that already contain a lot of sodium. Eat a diet rich in fruits and vegetables and whole grains.  Increase the clonidine to three times a day  DASH Eating Plan DASH stands for "Dietary Approaches to Stop Hypertension." The DASH eating plan is a healthy eating plan that has been shown to reduce high blood pressure (hypertension). Additional health benefits may include reducing the risk of type 2 diabetes mellitus, heart disease, and stroke. The DASH eating plan may also help with weight loss. WHAT DO I NEED TO KNOW ABOUT THE DASH EATING PLAN? For the DASH eating plan, you will follow these general guidelines:  Choose foods with a percent daily value for sodium of less than 5% (as listed on the food label).  Use salt-free seasonings or herbs instead of table salt or sea salt.  Check with your health care provider or pharmacist before using salt substitutes.  Eat lower-sodium products, often labeled as "lower sodium" or "no salt added."  Eat fresh foods.  Eat more vegetables, fruits, and low-fat dairy products.  Choose whole grains. Look for the word "whole" as the first word in the ingredient  list.  Choose fish and skinless chicken or [place] more often than red meat. Limit fish, poultry, and meat to 6 oz (170 g) each day.  Limit sweets, desserts, sugars, and sugary drinks.  Choose heart-healthy fats.  Limit cheese to 1 oz (28 g) per day.  Eat more home-cooked food and less restaurant, buffet, and fast food.  Limit fried foods.  Cook foods using methods other than frying.  Limit canned vegetables. If you do use them, rinse them well to decrease the sodium.  When eating at a restaurant, ask that your food be prepared with less salt, or no salt if possible. WHAT FOODS CAN I EAT? Seek help from a dietitian for individual calorie needs. Grains Whole grain or whole wheat bread. Brown rice. Whole grain or whole wheat pasta. Quinoa, bulgur, and whole grain cereals. Low-sodium cereals. Corn or whole wheat flour tortillas. Whole grain cornbread. Whole grain crackers. Low-sodium crackers. Vegetables Fresh or frozen vegetables (raw, steamed, roasted, or grilled). Low-sodium or reduced-sodium tomato and vegetable juices. Low-sodium or reduced-sodium tomato sauce and paste. Low-sodium or reduced-sodium canned vegetables.  Fruits All fresh, canned (in natural juice), or frozen fruits. Meat and Other Protein Products Ground beef (85% or leaner), grass-fed beef, or beef trimmed of fat. Skinless chicken or [place]. Ground chicken or [place]. Pork trimmed of fat. All fish and seafood. Eggs. Dried beans, peas, or lentils. Unsalted nuts and seeds. Unsalted canned beans. Dairy Low-fat dairy products, such as skim or 1% milk, 2% or reduced-fat cheeses, low-fat ricotta or cottage  cheese, or plain low-fat yogurt. Low-sodium or reduced-sodium cheeses. Fats and Oils Tub margarines without trans fats. Light or reduced-fat mayonnaise and salad dressings (reduced sodium). Avocado. Safflower, olive, or canola oils. Natural peanut or almond butter. Other Unsalted popcorn and pretzels. The items listed  above may not be a complete list of recommended foods or beverages. Contact your dietitian for more options. WHAT FOODS ARE NOT RECOMMENDED? Grains White bread. White pasta. White rice. Refined cornbread. Bagels and croissants. Crackers that contain trans fat. Vegetables Creamed or fried vegetables. Vegetables in a cheese sauce. Regular canned vegetables. Regular canned tomato sauce and paste. Regular tomato and vegetable juices. Fruits Dried fruits. Canned fruit in light or heavy syrup. Fruit juice. Meat and Other Protein Products Fatty cuts of meat. Ribs, chicken wings, bacon, sausage, bologna, salami, chitterlings, fatback, hot dogs, bratwurst, and packaged luncheon meats. Salted nuts and seeds. Canned beans with salt. Dairy Whole or 2% milk, cream, half-and-half, and cream cheese. Whole-fat or sweetened yogurt. Full-fat cheeses or blue cheese. Nondairy creamers and whipped toppings. Processed cheese, cheese spreads, or cheese curds. Condiments Onion and garlic salt, seasoned salt, table salt, and sea salt. Canned and packaged gravies. Worcestershire sauce. Tartar sauce. Barbecue sauce. Teriyaki sauce. Soy sauce, including reduced sodium. Steak sauce. Fish sauce. Oyster sauce. Cocktail sauce. Horseradish. Ketchup and mustard. Meat flavorings and tenderizers. Bouillon cubes. Hot sauce. Tabasco sauce. Marinades. Taco seasonings. Relishes. Fats and Oils Butter, stick margarine, lard, shortening, ghee, and bacon fat. Coconut, palm kernel, or palm oils. Regular salad dressings. Other Pickles and olives. Salted popcorn and pretzels. The items listed above may not be a complete list of foods and beverages to avoid. Contact your dietitian for more information. WHERE CAN I FIND MORE INFORMATION? National Heart, Lung, and Blood Institute: travelstabloid.com   This information is not intended to replace advice given to you by your health care provider. Make sure you  discuss any questions you have with your health care provider.   Document Released: 12/10/2010 Document Revised: 01/11/2014 Document Reviewed: 10/25/2012 Elsevier Interactive Patient Education 2016 [employer]. Smoking Cessation, Tips for Success If you are ready to quit smoking, congratulations! You have chosen to help yourself be healthier. Cigarettes bring nicotine, tar, carbon monoxide, and other irritants into your body. Your lungs, heart, and blood vessels will be able to work better without these poisons. There are many different ways to quit smoking. Nicotine gum, nicotine patches, a nicotine inhaler, or nicotine nasal spray can help with physical craving. Hypnosis, support groups, and medicines help break the habit of smoking. WHAT THINGS CAN I DO TO MAKE QUITTING EASIER?  Here are some tips to help you quit for good:  Pick a date when you will quit smoking completely. Tell all of your friends and family about your plan to quit on that date.  Do not try to slowly cut down on the number of cigarettes you are smoking. Pick a quit date and quit smoking completely starting on that day.  Throw away all cigarettes.   Clean and remove all ashtrays from your home, work, and car.  On a card, write down your reasons for quitting. Carry the card with you and read it when you get the urge to smoke.  Cleanse your body of nicotine. Drink enough water and fluids to keep your urine clear or pale yellow. Do this after quitting to flush the nicotine from your body.  Learn to predict your moods. Do not let a bad situation be your excuse to  have a cigarette. Some situations in your life might tempt you into wanting a cigarette.  Never have "just one" cigarette. It leads to wanting another and another. Remind yourself of your decision to quit.  Change habits associated with smoking. If you smoked while driving or when feeling stressed, try other activities to replace smoking. Stand up when drinking  your coffee. Brush your teeth after eating. Sit in a different chair when you read the paper. Avoid alcohol while trying to quit, and try to drink fewer caffeinated beverages. Alcohol and caffeine may urge you to smoke.  Avoid foods and drinks that can trigger a desire to smoke, such as sugary or spicy foods and alcohol.  Ask people who smoke not to smoke around you.  Have something planned to do right after eating or having a cup of coffee. For example, plan to take a walk or exercise.  Try a relaxation exercise to calm you down and decrease your stress. Remember, you may be tense and nervous for the first 2 weeks after you quit, but this will pass.  Find new activities to keep your hands busy. Play with a pen, coin, or rubber band. Doodle or draw things on paper.  Brush your teeth right after eating. This will help cut down on the craving for the taste of tobacco after meals. You can also try mouthwash.   Use oral substitutes in place of cigarettes. Try using lemon drops, carrots, cinnamon sticks, or chewing gum. Keep them handy so they are available when you have the urge to smoke.  When you have the urge to smoke, try deep breathing.  Designate your home as a nonsmoking area.  If you are a heavy smoker, ask your health care provider about a prescription for nicotine chewing gum. It can ease your withdrawal from nicotine.  Reward yourself. Set aside the cigarette money you save and buy yourself something nice.  Look for support from others. Join a support group or smoking cessation program. Ask someone at home or at work to help you with your plan to quit smoking.  Always ask yourself, "Do I need this cigarette or is this just a reflex?" Tell yourself, "Today, I choose not to smoke," or "I do not want to smoke." You are reminding yourself of your decision to quit.  Do not replace cigarette smoking with electronic cigarettes (commonly called e-cigarettes). The safety of e-cigarettes is  unknown, and some may contain harmful chemicals.  If you relapse, do not give up! Plan ahead and think about what you will do the next time you get the urge to smoke. HOW WILL I FEEL WHEN I QUIT SMOKING? You may have symptoms of withdrawal because your body is used to nicotine (the addictive substance in cigarettes). You may crave cigarettes, be irritable, feel very hungry, cough often, get headaches, or have difficulty concentrating. The withdrawal symptoms are only temporary. They are strongest when you first quit but will go away within 10-14 days. When withdrawal symptoms occur, stay in control. Think about your reasons for quitting. Remind yourself that these are signs that your body is healing and getting used to being without cigarettes. Remember that withdrawal symptoms are easier to treat than the major diseases that smoking can cause.  Even after the withdrawal is over, expect periodic urges to smoke. However, these cravings are generally short lived and will go away whether you smoke or not. Do not smoke! WHAT RESOURCES ARE AVAILABLE TO HELP ME QUIT SMOKING? Your health  care provider can direct you to community resources or hospitals for support, which may include:  Group support.  Education.  Hypnosis.  Therapy.   This information is not intended to replace advice given to you by your health care provider. Make sure you discuss any questions you have with your health care provider.   Document Released: 09/19/2003 Document Revised: 01/11/2014 Document Reviewed: 06/08/2012 Elsevier Interactive Patient Education [employer].

## 2015-07-03 ENCOUNTER — Ambulatory Visit: Payer: Self-pay | Admitting: Ophthalmology

## 2015-07-31 ENCOUNTER — Other Ambulatory Visit: Payer: Self-pay

## 2015-07-31 DIAGNOSIS — N189 Chronic kidney disease, unspecified: Secondary | ICD-10-CM

## 2015-07-31 DIAGNOSIS — I10 Essential (primary) hypertension: Secondary | ICD-10-CM

## 2015-08-01 LAB — COMPREHENSIVE METABOLIC PANEL
A/G RATIO: 1.5 (ref 1.2–2.2)
ALK PHOS: 123 IU/L — AB (ref 39–117)
ALT: 8 IU/L (ref 0–32)
AST: 12 IU/L (ref 0–40)
Albumin: 4.3 g/dL (ref 3.5–5.5)
BUN/Creatinine Ratio: 13 (ref 9–23)
BUN: 19 mg/dL (ref 6–24)
Bilirubin Total: 0.3 mg/dL (ref 0.0–1.2)
CALCIUM: 9.8 mg/dL (ref 8.7–10.2)
CHLORIDE: 105 mmol/L (ref 96–106)
CO2: 19 mmol/L (ref 18–29)
Creatinine, Ser: 1.45 mg/dL — ABNORMAL HIGH (ref 0.57–1.00)
GFR calc Af Amer: 51 mL/min/{1.73_m2} — ABNORMAL LOW (ref >59)
GFR, EST NON AFRICAN AMERICAN: 44 mL/min/{1.73_m2} — AB (ref >59)
GLOBULIN, TOTAL: 2.9 g/dL (ref 1.5–4.5)
Glucose: 87 mg/dL (ref 65–99)
POTASSIUM: 6.1 mmol/L — AB (ref 3.5–5.2)
SODIUM: 139 mmol/L (ref 134–144)
Total Protein: 7.2 g/dL (ref 6.0–8.5)

## 2015-08-01 LAB — CBC WITH DIFFERENTIAL/PLATELET
BASOS ABS: 0.1 10*3/uL (ref 0.0–0.2)
Basos: 1 %
EOS (ABSOLUTE): 0.3 10*3/uL (ref 0.0–0.4)
Eos: 4 %
Hematocrit: 39.6 % (ref 34.0–46.6)
Hemoglobin: 12.9 g/dL (ref 11.1–15.9)
IMMATURE GRANS (ABS): 0 10*3/uL (ref 0.0–0.1)
IMMATURE GRANULOCYTES: 0 %
LYMPHS: 39 %
Lymphocytes Absolute: 3.1 10*3/uL (ref 0.7–3.1)
MCH: 31.1 pg (ref 26.6–33.0)
MCHC: 32.6 g/dL (ref 31.5–35.7)
MCV: 95 fL (ref 79–97)
MONOS ABS: 0.5 10*3/uL (ref 0.1–0.9)
Monocytes: 7 %
NEUTROS PCT: 49 %
Neutrophils Absolute: 3.9 10*3/uL (ref 1.4–7.0)
PLATELETS: 250 10*3/uL (ref 150–379)
RBC: 4.15 x10E6/uL (ref 3.77–5.28)
RDW: 13.3 % (ref 12.3–15.4)
WBC: 7.9 10*3/uL (ref 3.4–10.8)

## 2015-08-01 LAB — MICROALBUMIN / CREATININE URINE RATIO
Creatinine, Urine: 45.7 mg/dL
MICROALB/CREAT RATIO: 173.5 mg/g creat — ABNORMAL HIGH (ref 0.0–30.0)
Microalbumin, Urine: 79.3 ug/mL

## 2015-08-01 LAB — LIPID PANEL
CHOL/HDL RATIO: 4.1 ratio (ref 0.0–4.4)
Cholesterol, Total: 119 mg/dL (ref 100–199)
HDL: 29 mg/dL — AB (ref >39)
LDL Calculated: 73 mg/dL (ref 0–99)
Triglycerides: 83 mg/dL (ref 0–149)
VLDL CHOLESTEROL CAL: 17 mg/dL (ref 5–40)

## 2015-08-01 LAB — RENAL FUNCTION PANEL: PHOSPHORUS: 3.7 mg/dL (ref 2.5–4.5)

## 2015-08-07 ENCOUNTER — Ambulatory Visit: Payer: Self-pay | Admitting: Nurse Practitioner

## 2015-08-07 VITALS — BP 137/71 | HR 70 | Temp 98.3°F | Ht 61.0 in | Wt 155.0 lb

## 2015-08-07 DIAGNOSIS — E782 Mixed hyperlipidemia: Secondary | ICD-10-CM

## 2015-08-07 DIAGNOSIS — E875 Hyperkalemia: Secondary | ICD-10-CM

## 2015-08-07 DIAGNOSIS — R5383 Other fatigue: Secondary | ICD-10-CM

## 2015-08-07 NOTE — Progress Notes (Signed)
Here today, in follow up from nephrologhy, last K+ at 6.1, stated nephrology stated lisinopril must stop,   Wants her to continue her clonidine and increased amlodipine to 10 mg  Clonidine is to continue at 0.1 mg tid.    Continues to smoke 1/2 ppd, started smoking age 45.   Husband also smokes.    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   Last stroke 2014  C/o hip pain, throbs, worked as a Scientist, water quality for many years.    Has a history of carpal tunnel.  Sees nephrology in six months.   Son is a type I diabetes.   On exam alert verbally appropriate, in no acute distress, carotid burits not present, no thyromegaly, no adenopathy.    BBrS mildly diminished, thoughn clear  + for murmur on R mid chest, approx 3rd intercostal space, approx 2-3/vi.    Plan:  Will draw a bmp tonight, and tsh  Will redraw a full set of labs in October.  Pt to come sooner if any more concerns, pleas not med changes, Nephrologist stopped lisinopril r/t hypokalema.    Pt to retun to clinic in October after lav results area avbailable.

## 2015-08-26 ENCOUNTER — Telehealth: Payer: Self-pay

## 2015-08-26 NOTE — Telephone Encounter (Signed)
-----   Message from Nori Riis, PA-C sent at 08/21/2015  7:00 PM EDT ----- Please have the patient come in for an office visit to discuss starting an ACE inhibitor.

## 2015-08-26 NOTE — Telephone Encounter (Signed)
Patient has been scheduled for 08/28/15 at 6:00 pm. Patient only wants to see Larene Beach or Butch Penny.

## 2015-08-28 ENCOUNTER — Ambulatory Visit: Payer: Self-pay | Admitting: Family Medicine

## 2015-08-28 VITALS — BP 144/78 | HR 66 | Wt 161.0 lb

## 2015-08-28 DIAGNOSIS — I10 Essential (primary) hypertension: Secondary | ICD-10-CM

## 2015-08-28 NOTE — Patient Instructions (Signed)
Stay on current medications.

## 2015-08-28 NOTE — Progress Notes (Signed)
Subjective:     Patient ID: Gabrielle Schultz, female   DOB: 26-Nov-1970, 45 y.o.   MRN: [id number]  HPI pt with CKD, followed by Nephrology.  Stopped lisinopril 3 weeks ago.  BP  Increased and she increased clonidine to 3 times. Is sleepy all the time but that's been chronic.  No change in urination, no CP (except for heartburn breakthrough). Is chronically SOB, possibly due to smoking.   Review of Systems     Objective:   Physical Exam A+O Conj clear RRR CTA NT/ND +2 pulses, no edema    Assessment:        Plan:     HTN and CKD.  Check Met B today. Stay on current BP meds.  RTC 3 months

## 2015-08-29 LAB — BASIC METABOLIC PANEL
BUN/Creatinine Ratio: 8 — ABNORMAL LOW (ref 9–23)
BUN: 12 mg/dL (ref 6–24)
CALCIUM: 9.3 mg/dL (ref 8.7–10.2)
CHLORIDE: 106 mmol/L (ref 96–106)
CO2: 20 mmol/L (ref 18–29)
Creatinine, Ser: 1.57 mg/dL — ABNORMAL HIGH (ref 0.57–1.00)
GFR, EST AFRICAN AMERICAN: 47 mL/min/{1.73_m2} — AB (ref >59)
GFR, EST NON AFRICAN AMERICAN: 40 mL/min/{1.73_m2} — AB (ref >59)
Glucose: 93 mg/dL (ref 65–99)
Potassium: 5.3 mmol/L — ABNORMAL HIGH (ref 3.5–5.2)
Sodium: 141 mmol/L (ref 134–144)

## 2015-09-04 ENCOUNTER — Telehealth: Payer: Self-pay | Admitting: Nurse Practitioner

## 2015-09-04 ENCOUNTER — Telehealth: Payer: Self-pay

## 2015-09-04 NOTE — Telephone Encounter (Signed)
Called patient to return message she left on 09/04/15 at 12:06pm.  Patient was asking about what medication it was that [facility] had called her on earlier.  Reviewed chart prior to calling patient, couldn't find anything where we had ordered any and sent to pharmacy.   I called patient and she had told me that she had gotten in touch with [facility] and found out it was an inhaler that was on automatic refill status.  Question resolved.    She also mentioned her BP last night was 182.  She wanted to be seen.  I've made her an appointment on 09/09/15 at 6:45pm.

## 2015-09-04 NOTE — Telephone Encounter (Signed)
Medication Management called patient and informed her that her prescription is in but she is not sure what prescription that is.

## 2015-09-09 ENCOUNTER — Ambulatory Visit: Payer: Self-pay | Admitting: Nurse Practitioner

## 2015-09-09 DIAGNOSIS — I10 Essential (primary) hypertension: Secondary | ICD-10-CM

## 2015-09-09 MED ORDER — CLONIDINE HCL 0.1 MG PO TABS
ORAL_TABLET | ORAL | 2 refills | Status: DC
Start: 2015-09-09 — End: 2015-09-18

## 2015-09-09 NOTE — Telephone Encounter (Signed)
This encounter was created in error - please disregard.

## 2015-09-09 NOTE — Progress Notes (Signed)
IS SEEN BY LOCAL [facility], HAS BEEN TAKEN OFF LISINOPRIL, PT STATES HER BP HAS BEEN HIGHER  AMLODIPINE WAS INCREASED TO 10 MG, APPROX 1 WEEK AGO.    AT LAST VISIT, DR. LADA INCREASED CLONIDINE TO TID.    BP TONIGHT AT 173/87  PT'S MEASUREMENTS RANGE FROM 120/72 TO [id number], WITH DIASTOLIC BP'S MOSTLY IN THE 80'S.    ASSESS/PLAN:  WILL INCREASE CLONIDINE FROM 0.1, TID TO #2 0.1 BID OR 0.2 MG BID  WILL CHANGE TABLET DOSE WHEN PT NEEDS REFILL.  WILL HAVE PT RETURN IN TWO WEEKS TO CHECK BP RESPONSE

## 2015-09-18 ENCOUNTER — Other Ambulatory Visit: Payer: Self-pay

## 2015-09-18 DIAGNOSIS — I10 Essential (primary) hypertension: Secondary | ICD-10-CM

## 2015-09-18 MED ORDER — CLONIDINE HCL 0.1 MG PO TABS: ORAL_TABLET | ORAL | 2 refills | Status: DC

## 2015-09-25 ENCOUNTER — Ambulatory Visit: Payer: Self-pay | Admitting: Urology

## 2015-09-25 VITALS — BP 140/73 | HR 63 | Temp 99.3°F | Ht 61.0 in | Wt 172.0 lb

## 2015-09-25 DIAGNOSIS — N189 Chronic kidney disease, unspecified: Secondary | ICD-10-CM

## 2015-09-25 DIAGNOSIS — R519 Headache, unspecified: Secondary | ICD-10-CM

## 2015-09-25 DIAGNOSIS — R51 Headache: Secondary | ICD-10-CM

## 2015-09-25 DIAGNOSIS — J4521 Mild intermittent asthma with (acute) exacerbation: Secondary | ICD-10-CM

## 2015-09-25 MED ORDER — BECLOMETHASONE DIPROPIONATE 40 MCG/ACT IN AERS: 1.0000 | INHALATION_SPRAY | Freq: Two times a day (BID) | RESPIRATORY_TRACT | Status: DC

## 2015-09-25 NOTE — Progress Notes (Signed)
  Patient: Gabrielle Schultz Female    DOB: 10-Nov-1970   45 y.o.   MRN: [id number] Visit Date: 09/25/2015  Today's Provider: [place]   Chief Complaint  Patient presents with  . Follow-up    also a BP check   Subjective:    HPI Patient is a 45 year old who has a history of HTN, CKD and COPD who presents today for a BP check.    Patient is taking her Ventolin inhalers every 4 fours.  Out of breath after a shower.      Allergies  Allergen Reactions  . Banana   . Coconut Oil Swelling  . Strawberry Extract   . Amoxicillin Rash  . Penicillins Rash   Previous Medications   ALBUTEROL (PROVENTIL HFA;VENTOLIN HFA) 108 (90 BASE) MCG/ACT INHALER    Inhale 2 puffs into the lungs every 4 (four) hours as needed for wheezing or shortness of breath.   ALBUTEROL (PROVENTIL HFA;VENTOLIN HFA) 108 (90 BASE) MCG/ACT INHALER    Inhale into the lungs.   AMLODIPINE (NORVASC) 10 MG TABLET    Take 10 mg by mouth.   ASPIRIN 81 MG TABLET    Take 81 mg by mouth daily.   CETIRIZINE (ZYRTEC) 10 MG TABLET    Take 1 tablet (10 mg total) by mouth daily.   CLONIDINE (CATAPRES) 0.1 MG TABLET    2 TABLETS BY MOUTH TWICE A DAY   FLUTICASONE (FLONASE) 50 MCG/ACT NASAL SPRAY    Place 1 spray into both nostrils 2 (two) times daily.   LOPERAMIDE (IMODIUM) 2 MG CAPSULE    Take 2 mg by mouth as needed for diarrhea or loose stools (about once a week).   OMEPRAZOLE (PRILOSEC) 20 MG CAPSULE    Take 20 mg by mouth.   RANITIDINE (ZANTAC) 150 MG TABLET    Take 150 mg by mouth 2 (two) times daily.    SIMVASTATIN (ZOCOR) 20 MG TABLET    Take 20 mg by mouth daily.   SODIUM BICARBONATE 650 MG TABLET    Take 650 mg by mouth.    Review of Systems  Social History  Substance Use Topics  . Smoking status: Current Every Day Smoker    Packs/day: 0.50    Years: 18.00    Types: Cigarettes  . Smokeless tobacco: Not on file  . Alcohol use Yes     Comment: occasionally   Objective:   BP 140/73   Pulse 63   Temp  99.3 F (37.4 C)   Ht 5\' 1"  (1.549 m)   Wt 172 lb (78 kg)   LMP 09/11/2015   SpO2 100%   BMI 32.50 kg/m   Physical Exam Constitutional: Well nourished. Alert and oriented, No acute distress. HEENT: [place] AT, moist mucus membranes. Trachea midline, no masses. Cardiovascular: No clubbing, cyanosis, or edema. Respiratory: Normal respiratory effort, no increased work of breathing. Lymph: No cervical or inguinal adenopathy. Neurologic: Grossly intact, no focal deficits, moving all 4 extremities. Psychiatric: Normal mood and affect.      Assessment & Plan:    1. Asthma  - add Qvar  2. CKD  - unhappy with current nephrologist- refer to [facility]  3. HTN  - still some what elevated  - try the Clonidine 0.3 in the am and Clonidine 0.2 in the pm  - recheck BP in 2 weeks       [facility] [place]

## 2015-09-25 NOTE — Patient Instructions (Signed)
Take a 0.3 Clonidine in the am  Take a 0.2 Clonidine in the pm

## 2015-10-09 ENCOUNTER — Ambulatory Visit: Payer: Self-pay | Admitting: Urology

## 2015-10-09 ENCOUNTER — Other Ambulatory Visit: Payer: Self-pay | Admitting: Urology

## 2015-10-09 VITALS — BP 135/72 | HR 60

## 2015-10-09 DIAGNOSIS — Z0130 Encounter for examination of blood pressure without abnormal findings: Secondary | ICD-10-CM

## 2015-10-09 MED ORDER — BECLOMETHASONE DIPROPIONATE 40 MCG/ACT IN AERS: 1.0000 | INHALATION_SPRAY | Freq: Two times a day (BID) | RESPIRATORY_TRACT | 4 refills | Status: DC

## 2015-10-22 ENCOUNTER — Other Ambulatory Visit: Payer: Self-pay

## 2015-10-22 DIAGNOSIS — N189 Chronic kidney disease, unspecified: Secondary | ICD-10-CM

## 2015-10-22 DIAGNOSIS — N289 Disorder of kidney and ureter, unspecified: Secondary | ICD-10-CM

## 2015-10-22 DIAGNOSIS — E782 Mixed hyperlipidemia: Secondary | ICD-10-CM

## 2015-10-22 DIAGNOSIS — R5383 Other fatigue: Secondary | ICD-10-CM

## 2015-10-23 LAB — CBC WITH DIFFERENTIAL/PLATELET
Basophils Absolute: 0 10*3/uL (ref 0.0–0.2)
Basos: 0 %
EOS (ABSOLUTE): 0.3 10*3/uL (ref 0.0–0.4)
EOS: 3 %
HEMATOCRIT: 38.8 % (ref 34.0–46.6)
Hemoglobin: 12.6 g/dL (ref 11.1–15.9)
IMMATURE GRANS (ABS): 0 10*3/uL (ref 0.0–0.1)
IMMATURE GRANULOCYTES: 0 %
LYMPHS: 25 %
Lymphocytes Absolute: 2.3 10*3/uL (ref 0.7–3.1)
MCH: 30.8 pg (ref 26.6–33.0)
MCHC: 32.5 g/dL (ref 31.5–35.7)
MCV: 95 fL (ref 79–97)
MONOS ABS: 0.6 10*3/uL (ref 0.1–0.9)
Monocytes: 7 %
NEUTROS PCT: 65 %
Neutrophils Absolute: 5.8 10*3/uL (ref 1.4–7.0)
PLATELETS: 197 10*3/uL (ref 150–379)
RBC: 4.09 x10E6/uL (ref 3.77–5.28)
RDW: 13.4 % (ref 12.3–15.4)
WBC: 9.1 10*3/uL (ref 3.4–10.8)

## 2015-10-23 LAB — URINALYSIS
Bilirubin, UA: NEGATIVE
Glucose, UA: NEGATIVE
Ketones, UA: NEGATIVE
NITRITE UA: POSITIVE — AB
PH UA: 5.5 (ref 5.0–7.5)
Specific Gravity, UA: 1.019 (ref 1.005–1.030)
Urobilinogen, Ur: 1 mg/dL (ref 0.2–1.0)

## 2015-10-23 LAB — LIPID PANEL
CHOL/HDL RATIO: 3.5 ratio (ref 0.0–4.4)
Cholesterol, Total: 135 mg/dL (ref 100–199)
HDL: 39 mg/dL — AB (ref >39)
LDL Calculated: 80 mg/dL (ref 0–99)
TRIGLYCERIDES: 80 mg/dL (ref 0–149)
VLDL CHOLESTEROL CAL: 16 mg/dL (ref 5–40)

## 2015-10-28 ENCOUNTER — Ambulatory Visit: Payer: Self-pay | Admitting: Adult Health Nurse Practitioner

## 2015-10-28 VITALS — BP 148/81 | HR 63 | Ht 61.0 in | Wt 178.0 lb

## 2015-10-28 DIAGNOSIS — I10 Essential (primary) hypertension: Secondary | ICD-10-CM

## 2015-10-28 DIAGNOSIS — N390 Urinary tract infection, site not specified: Secondary | ICD-10-CM

## 2015-10-28 DIAGNOSIS — N183 Chronic kidney disease, stage 3 unspecified: Secondary | ICD-10-CM

## 2015-10-28 MED ORDER — CIPROFLOXACIN HCL 500 MG PO TABS: 500.0000 mg | ORAL_TABLET | Freq: Two times a day (BID) | ORAL | 0 refills | Status: DC

## 2015-10-28 NOTE — Progress Notes (Signed)
Patient: ANALYSSE QUINONEZ Female    DOB: 23-Jul-1970   45 y.o.   MRN: [id number] Visit Date: 10/28/2015  Today's Provider: [place]   Chief Complaint  Patient presents with  . Hypertension  . Chronic Kidney Disease   Subjective:    HPI   Burning with urination reported.  UA positive with nitrites, leuks and RBCs.  Denies back pain.  Denies hematuria.   Asthma:  Using qvar with moderate relief.  Still has some SOB with walking distances.  Has not used ventolin.    HTN:  Taking BP meds as directed.  Not taking BP at home.  Monitoring salt in diet.  Walking 3 days per week.    Allergies  Allergen Reactions  . Banana   . Coconut Oil Swelling  . Strawberry Extract   . Amoxicillin Rash  . Penicillins Rash   Previous Medications   ALBUTEROL (PROVENTIL HFA;VENTOLIN HFA) 108 (90 BASE) MCG/ACT INHALER    Inhale 2 puffs into the lungs every 4 (four) hours as needed for wheezing or shortness of breath.   ALBUTEROL (PROVENTIL HFA;VENTOLIN HFA) 108 (90 BASE) MCG/ACT INHALER    Inhale into the lungs.   AMLODIPINE (NORVASC) 10 MG TABLET    Take 10 mg by mouth.   ASPIRIN 81 MG TABLET    Take 81 mg by mouth daily.   BECLOMETHASONE (QVAR) 40 MCG/ACT INHALER    Inhale 1 puff into the lungs 2 (two) times daily.   CETIRIZINE (ZYRTEC) 10 MG TABLET    Take 1 tablet (10 mg total) by mouth daily.   CLONIDINE (CATAPRES) 0.1 MG TABLET    2 TABLETS BY MOUTH TWICE A DAY   FLUTICASONE (FLONASE) 50 MCG/ACT NASAL SPRAY    Place 1 spray into both nostrils 2 (two) times daily.   LOPERAMIDE (IMODIUM) 2 MG CAPSULE    Take 2 mg by mouth as needed for diarrhea or loose stools (about once a week).   OMEPRAZOLE (PRILOSEC) 20 MG CAPSULE    Take 20 mg by mouth.   RANITIDINE (ZANTAC) 150 MG TABLET    Take 150 mg by mouth 2 (two) times daily.    SIMVASTATIN (ZOCOR) 20 MG TABLET    Take 20 mg by mouth daily.   SODIUM BICARBONATE 650 MG TABLET    Take 650 mg by mouth.    Review of Systems  All  other systems reviewed and are negative.   Social History  Substance Use Topics  . Smoking status: Current Every Day Smoker    Packs/day: 0.50    Years: 18.00    Types: Cigarettes  . Smokeless tobacco: Never Used  . Alcohol use Yes     Comment: "seldom" - drinks 350-739mL when she does drink   Objective:   BP (!) 148/81   Pulse 63   Ht 5\' 1"  (1.549 m)   Wt 178 lb (80.7 kg)   LMP 10/27/2015   BMI 33.63 kg/m   Physical Exam  Constitutional: She is oriented to person, place, and time. She appears well-developed and well-nourished.  HENT:  Head: Normocephalic and atraumatic.  Eyes: Pupils are equal, round, and reactive to light.  Neck: Normal range of motion. Neck supple.  Cardiovascular: Normal rate and regular rhythm.   Pulmonary/Chest: Effort normal and breath sounds normal.  Abdominal: Soft. Bowel sounds are normal.  No CVA tenderness.   Musculoskeletal: Normal range of motion.  Neurological: She is alert and oriented to person, place, and time.  Skin: Skin  is warm and dry.  Psychiatric: She has a normal mood and affect.        Assessment & Plan:      HTN:  Not controlled.  Goal BP <140/90.  Increase clonidine to 0.3 BID.  Encourage low salt diet and exercise.  FU in 1 month for BP check and repeat BMP to check kidney function.     ASTHMA:  Stable.  Continue current regimen.   UTI:  Take antibiotics as directed.  Increase fluids.         [facility] [place]

## 2015-10-29 ENCOUNTER — Emergency Department
Admission: EM | Admit: 2015-10-29 | Discharge: 2015-10-29 | Disposition: A | Discharge status: Home / Self Care | Payer: Self-pay | Attending: Emergency Medicine | Admitting: Emergency Medicine

## 2015-10-29 ENCOUNTER — Encounter: Payer: Self-pay | Admitting: Emergency Medicine

## 2015-10-29 DIAGNOSIS — I252 Old myocardial infarction: Secondary | ICD-10-CM | POA: Insufficient documentation

## 2015-10-29 DIAGNOSIS — F17210 Nicotine dependence, cigarettes, uncomplicated: Secondary | ICD-10-CM | POA: Insufficient documentation

## 2015-10-29 DIAGNOSIS — J45909 Unspecified asthma, uncomplicated: Secondary | ICD-10-CM | POA: Insufficient documentation

## 2015-10-29 DIAGNOSIS — Z79899 Other long term (current) drug therapy: Secondary | ICD-10-CM | POA: Insufficient documentation

## 2015-10-29 DIAGNOSIS — I129 Hypertensive chronic kidney disease with stage 1 through stage 4 chronic kidney disease, or unspecified chronic kidney disease: Secondary | ICD-10-CM | POA: Insufficient documentation

## 2015-10-29 DIAGNOSIS — N183 Chronic kidney disease, stage 3 (moderate): Secondary | ICD-10-CM | POA: Insufficient documentation

## 2015-10-29 DIAGNOSIS — R531 Weakness: Secondary | ICD-10-CM | POA: Insufficient documentation

## 2015-10-29 LAB — COMPREHENSIVE METABOLIC PANEL
ALT: 21 U/L (ref 14–54)
ANION GAP: 6 (ref 5–15)
AST: 27 U/L (ref 15–41)
Albumin: 4 g/dL (ref 3.5–5.0)
Alkaline Phosphatase: 98 U/L (ref 38–126)
BUN: 18 mg/dL (ref 6–20)
CHLORIDE: 109 mmol/L (ref 101–111)
CO2: 21 mmol/L — AB (ref 22–32)
Calcium: 9.1 mg/dL (ref 8.9–10.3)
Creatinine, Ser: 1.46 mg/dL — ABNORMAL HIGH (ref 0.44–1.00)
GFR, EST AFRICAN AMERICAN: 50 mL/min — AB (ref >60)
GFR, EST NON AFRICAN AMERICAN: 43 mL/min — AB (ref >60)
Glucose, Bld: 110 mg/dL — ABNORMAL HIGH (ref 65–99)
Potassium: 4.1 mmol/L (ref 3.5–5.1)
SODIUM: 136 mmol/L (ref 135–145)
Total Bilirubin: 0.3 mg/dL (ref 0.3–1.2)
Total Protein: 7.6 g/dL (ref 6.5–8.1)

## 2015-10-29 LAB — CBC WITH DIFFERENTIAL/PLATELET
Basophils Absolute: 0 10*3/uL (ref 0–0.1)
Basophils Relative: 1 %
EOS ABS: 0.3 10*3/uL (ref 0–0.7)
EOS PCT: 4 %
HCT: 40.8 % (ref 35.0–47.0)
Hemoglobin: 13.8 g/dL (ref 12.0–16.0)
LYMPHS ABS: 1.8 10*3/uL (ref 1.0–3.6)
Lymphocytes Relative: 20 %
MCH: 31.8 pg (ref 26.0–34.0)
MCHC: 33.7 g/dL (ref 32.0–36.0)
MCV: 94.4 fL (ref 80.0–100.0)
MONO ABS: 0.6 10*3/uL (ref 0.2–0.9)
MONOS PCT: 6 %
Neutro Abs: 6.3 10*3/uL (ref 1.4–6.5)
Neutrophils Relative %: 69 %
PLATELETS: 191 10*3/uL (ref 150–440)
RBC: 4.32 MIL/uL (ref 3.80–5.20)
RDW: 13.3 % (ref 11.5–14.5)
WBC: 9 10*3/uL (ref 3.6–11.0)

## 2015-10-29 MED ORDER — LEVOFLOXACIN 750 MG PO TABS
750.0000 mg | ORAL_TABLET | Freq: Once | ORAL | Status: AC
  Administered 2015-10-29: 750 mg via ORAL
  Filled 2015-10-29: qty 1

## 2015-10-29 NOTE — ED Notes (Signed)
Pt resting at present  No change on monitor   States she is not able to void at present  Feeling better

## 2015-10-29 NOTE — ED Notes (Signed)
Brought in by EMS from home   States she was seen [facility] yesterday and dx'd with UTI. But this am woke up "feeling strange"  Denies any pain ,fever

## 2015-10-29 NOTE — ED Provider Notes (Signed)
[facility] Emergency Department Provider Note        Time seen: ----------------------------------------- 9:52 AM on 10/29/2015 -----------------------------------------    I have reviewed the triage vital signs and the nursing notes.   HISTORY  Chief Complaint feeling strange    HPI Gabrielle Schultz is a 45 y.o. female who presents to the ER for not feeling right. Patient cannot quantify her symptoms, she states she did not feel well for the last hour, that she felt strange so she decided to come to the ER for evaluation. Recently she was diagnosed with UTI but has not started her antibiotics yet. She denies fevers, chills, chest pain, shortness of breath, vomiting or diarrhea.   Past Medical History:  Diagnosis Date  . Acid reflux   . Acid reflux   . Allergic rhinitis 06/26/2015  . Allergy   . Asthma   . Carpal tunnel syndrome    right hand  . Chronic kidney disease   . Colitis   . CVA (cerebral infarction)    2009, 2015  . History of syncope   . Hypertension 2008  . Low HDL (under 40) 06/26/2015  . MI (myocardial infarction)    2015  . Migraines   . Poor circulation   . Stroke ([place])   . Syncope and collapse     Patient Active Problem List   Diagnosis Date Noted  . Urinary tract infection 10/28/2015  . Low HDL (under 40) 06/26/2015  . Allergic rhinitis 06/26/2015  . Tobacco abuse 06/26/2015  . Cerebral infarct ([place]) 11/21/2013  . Chronic kidney disease (CKD), stage III (moderate) 11/21/2013  . Hypertension 08/12/2011  . LVH (left ventricular hypertrophy) 08/12/2011  . Chronic renal insufficiency 08/12/2011    Past Surgical History:  Procedure Laterality Date  . CESAREAN SECTION  2004  . COLONOSCOPY WITH PROPOFOL N/A 06/26/2014   Procedure: COLONOSCOPY WITH PROPOFOL;  Surgeon: Josefine Class, MD;  Location: [facility] ENDOSCOPY;  Service: Endoscopy;  Laterality: N/A;    Allergies Banana; Coconut oil; Strawberry extract;  Amoxicillin; and Penicillins  Social History Social History  Substance Use Topics  . Smoking status: Current Every Day Smoker    Packs/day: 0.50    Years: 18.00    Types: Cigarettes  . Smokeless tobacco: Never Used  . Alcohol use Yes     Comment: "seldom" - drinks 350-76mL when she does drink    Review of Systems Constitutional: Negative for fever. Cardiovascular: Negative for chest pain. Respiratory: Negative for shortness of breath. Gastrointestinal: Negative for abdominal pain, vomiting and diarrhea. Genitourinary: Negative for dysuria. Musculoskeletal: Negative for back pain. Skin: Negative for rash. Neurological: Negative for headaches, focal weakness or numbness.  10-point ROS otherwise negative.  ____________________________________________   PHYSICAL EXAM:  VITAL SIGNS: ED Triage Vitals [10/29/15 0928]  Enc Vitals Group     BP (!) 172/99     Pulse Rate 69     Resp 18     Temp 98 F (36.7 C)     Temp src      SpO2 99 %     Weight 178 lb (80.7 kg)     Height 5\' 1"  (1.549 m)     Head Circumference      Peak Flow      Pain Score      Pain Loc      Pain Edu?      Excl. in [place]?     Constitutional: Alert and oriented. Well appearing and in no distress.  Eyes: Conjunctivae are normal. PERRL. Normal extraocular movements. ENT   Head: Normocephalic and atraumatic.   Nose: No congestion/rhinnorhea.   Mouth/Throat: Mucous membranes are moist.   Neck: No stridor. Cardiovascular: Normal rate, regular rhythm. No murmurs, rubs, or gallops. Respiratory: Normal respiratory effort without tachypnea nor retractions. Breath sounds are clear and equal bilaterally. No wheezes/rales/rhonchi. Gastrointestinal: Soft and nontender. Normal bowel sounds Musculoskeletal: Nontender with normal range of motion in all extremities. No lower extremity tenderness nor edema. Neurologic:  Normal speech and language. No gross focal neurologic deficits are appreciated.   Skin:  Skin is warm, dry and intact. No rash noted. Psychiatric: Mood and affect are normal. Speech and behavior are normal.  ____________________________________________  ED COURSE:  Pertinent labs & imaging results that were available during my care of the patient were reviewed by me and considered in my medical decision making (see chart for details). Clinical Course  Patient is in no acute distress, we will assess with basic labs and reevaluate.  Procedures ____________________________________________   LABS (pertinent positives/negatives)  Labs Reviewed  COMPREHENSIVE METABOLIC PANEL - Abnormal; Notable for the following:       Result Value   CO2 21 (*)    Glucose, Bld 110 (*)    Creatinine, Ser 1.46 (*)    GFR calc non Af Amer 43 (*)    GFR calc Af Amer 50 (*)    All other components within normal limits  CBC WITH DIFFERENTIAL/PLATELET  URINALYSIS COMPLETEWITH MICROSCOPIC ([facility] ONLY)                110 (*)    Creatinine, Ser 1.46 (*)    GFR calc non Af Amer 43 (*)    GFR calc Af Amer 50 (*)    All other components within normal limits  CBC WITH DIFFERENTIAL/PLATELET  URINALYSIS COMPLETEWITH MICROSCOPIC ([facility] ONLY)   ____________________________________________  FINAL ASSESSMENT AND PLAN  Weakness  Plan: Patient with labs as dictated above. Patient is in no acute distress, no clear etiology for her symptoms. I will start her on antibiotics for her UTI. She has been unable to provide a urine specimen. She is stable for discharge.   Earleen Newport, MD   Note: This dictation was prepared with Dragon dictation. Any transcriptional errors that result from this process are unintentional    Earleen Newport, MD 10/29/15 1241

## 2015-11-13 ENCOUNTER — Emergency Department
Admission: EM | Admit: 2015-11-13 | Discharge: 2015-11-13 | Disposition: A | Discharge status: Home / Self Care | Payer: Self-pay | Attending: Student in an Organized Health Care Education/Training Program | Admitting: Student in an Organized Health Care Education/Training Program

## 2015-11-13 ENCOUNTER — Encounter: Payer: Self-pay | Admitting: Emergency Medicine

## 2015-11-13 DIAGNOSIS — M791 Myalgia: Secondary | ICD-10-CM | POA: Insufficient documentation

## 2015-11-13 DIAGNOSIS — M7918 Myalgia, other site: Secondary | ICD-10-CM

## 2015-11-13 DIAGNOSIS — Z79899 Other long term (current) drug therapy: Secondary | ICD-10-CM | POA: Insufficient documentation

## 2015-11-13 DIAGNOSIS — I129 Hypertensive chronic kidney disease with stage 1 through stage 4 chronic kidney disease, or unspecified chronic kidney disease: Secondary | ICD-10-CM | POA: Insufficient documentation

## 2015-11-13 DIAGNOSIS — N183 Chronic kidney disease, stage 3 (moderate): Secondary | ICD-10-CM | POA: Insufficient documentation

## 2015-11-13 DIAGNOSIS — I252 Old myocardial infarction: Secondary | ICD-10-CM | POA: Insufficient documentation

## 2015-11-13 DIAGNOSIS — J45909 Unspecified asthma, uncomplicated: Secondary | ICD-10-CM | POA: Insufficient documentation

## 2015-11-13 DIAGNOSIS — F17210 Nicotine dependence, cigarettes, uncomplicated: Secondary | ICD-10-CM | POA: Insufficient documentation

## 2015-11-13 LAB — URINALYSIS COMPLETE WITH MICROSCOPIC (ARMC ONLY)
Bilirubin Urine: NEGATIVE
Glucose, UA: NEGATIVE mg/dL
Ketones, ur: NEGATIVE mg/dL
LEUKOCYTES UA: NEGATIVE
NITRITE: NEGATIVE
PROTEIN: 100 mg/dL — AB
SPECIFIC GRAVITY, URINE: 1.004 — AB (ref 1.005–1.030)
pH: 5 (ref 5.0–8.0)

## 2015-11-13 MED ORDER — MELOXICAM 15 MG PO TABS: 15.0000 mg | ORAL_TABLET | Freq: Every day | ORAL | 1 refills | Status: AC

## 2015-11-13 NOTE — ED Provider Notes (Signed)
[facility] Emergency Department Provider Note  ____________________________________________  Time seen: Approximately 5:31 PM  I have reviewed the triage vital signs and the nursing notes.   HISTORY  Chief Complaint Musculoskeletal Pain   HPI Gabrielle Schultz is a 45 y.o. female that presents with 2 episodes of stinging back pain. One was at 3 Pm and the other at 4pm. Patient states that she thinks an insect bit her but did not see an insect. Episodes of stinging pain in the past to 10 minutes. During episodes patient felt like her heart was racing and had chest tightness. Pain and tightness are not affected by movement. Patient has never had anything like this before. Patient has not taken anything for pain. Patient was recently treated with ciprofloxacin for urinary tract infection and denies dysuria frequency and urgency at this time. Patient has had 2 previous strokes and 1 heart attack. Patients potassium runs high.    Past Medical History:  Diagnosis Date  . Acid reflux   . Acid reflux   . Allergic rhinitis 06/26/2015  . Allergy   . Asthma   . Carpal tunnel syndrome    right hand  . Chronic kidney disease   . Colitis   . CVA (cerebral infarction)    2009, 2015  . History of syncope   . Hypertension 2008  . Low HDL (under 40) 06/26/2015  . MI (myocardial infarction)    2015  . Migraines   . Poor circulation   . Stroke ([place])   . Syncope and collapse     Patient Active Problem List   Diagnosis Date Noted  . Urinary tract infection 10/28/2015  . Low HDL (under 40) 06/26/2015  . Allergic rhinitis 06/26/2015  . Tobacco abuse 06/26/2015  . Cerebral infarct ([place]) 11/21/2013  . Chronic kidney disease (CKD), stage III (moderate) 11/21/2013  . Hypertension 08/12/2011  . LVH (left ventricular hypertrophy) 08/12/2011  . Chronic renal insufficiency 08/12/2011    Past Surgical History:  Procedure Laterality Date  . CARDIAC CATHETERIZATION    .  CESAREAN SECTION  2004  . COLONOSCOPY WITH PROPOFOL N/A 06/26/2014   Procedure: COLONOSCOPY WITH PROPOFOL;  Surgeon: Josefine Class, MD;  Location: [facility] ENDOSCOPY;  Service: Endoscopy;  Laterality: N/A;    Prior to Admission medications   Medication Sig Start Date End Date Taking? Authorizing Provider  albuterol (PROVENTIL HFA;VENTOLIN HFA) 108 (90 BASE) MCG/ACT inhaler Inhale 2 puffs into the lungs every 4 (four) hours as needed for wheezing or shortness of breath. 12/21/14   Charline Bills Cuthriell, PA-C  albuterol (PROVENTIL HFA;VENTOLIN HFA) 108 (90 Base) MCG/ACT inhaler Inhale into the lungs. 12/21/14   Historical Provider, MD  amLODipine (NORVASC) 10 MG tablet Take 10 mg by mouth. 08/04/15 08/03/16  Historical Provider, MD  aspirin 81 MG tablet Take 81 mg by mouth daily.    Historical Provider, MD  beclomethasone (QVAR) 40 MCG/ACT inhaler Inhale 1 puff into the lungs 2 (two) times daily. 10/09/15   Nori Riis, PA-C  cetirizine (ZYRTEC) 10 MG tablet Take 1 tablet (10 mg total) by mouth daily. 06/26/15   Arnetha Courser, MD  ciprofloxacin (CIPRO) 500 MG tablet Take 1 tablet (500 mg total) by mouth 2 (two) times daily. 10/28/15   Teah Doles-Johnson, NP  cloNIDine (CATAPRES) 0.1 MG tablet 2 TABLETS BY MOUTH TWICE A DAY 09/18/15   Shannon A McGowan, PA-C  fluticasone (FLONASE) 50 MCG/ACT nasal spray Place 1 spray into both nostrils 2 (two) times  daily. 06/26/15   Arnetha Courser, MD  loperamide (IMODIUM) 2 MG capsule Take 2 mg by mouth as needed for diarrhea or loose stools (about once a week).    Historical Provider, MD  meloxicam (MOBIC) 15 MG tablet Take 1 tablet (15 mg total) by mouth daily. 11/13/15 11/27/15  Laban Emperor, PA-C  omeprazole (PRILOSEC) 20 MG capsule Take 20 mg by mouth.    Historical Provider, MD  ranitidine (ZANTAC) 150 MG tablet Take 150 mg by mouth 2 (two) times daily.     Historical Provider, MD  simvastatin (ZOCOR) 20 MG tablet Take 20 mg by mouth daily.    Historical  Provider, MD  sodium bicarbonate 650 MG tablet Take 650 mg by mouth. 08/04/15 08/03/16  Historical Provider, MD    Allergies Banana; Coconut oil; Strawberry extract; Amoxicillin; and Penicillins  Family History  Problem Relation Age of Onset  . Hyperlipidemia Mother   . Heart disease Mother   . COPD Mother   . Hypertension Father   . Heart disease Father   . Hyperlipidemia Father   . Hypertension Sister   . Hypertension Brother   . Heart disease Brother     Social History Social History  Substance Use Topics  . Smoking status: Current Every Day Smoker    Packs/day: 0.50    Years: 18.00    Types: Cigarettes  . Smokeless tobacco: Never Used  . Alcohol use Yes     Comment: "seldom" - drinks 350-751mL when she does drink    Review of Systems  Constitutional: Negative for fever/chills. Patient feels cold Cardiac: Negative for chest pain. No arm pain. No jaw pain.   Respiratory: Negative for shortness of breath. Musculoskeletal: Negative for pain. Genitourinary: No dysuria Skin: Mole on back since childhood Neurological: Negative for headaches, focal weakness or numbness. ____________________________________________   PHYSICAL EXAM:  VITAL SIGNS: ED Triage Vitals [11/13/15 1640]  Enc Vitals Group     BP (!) 186/82     Pulse Rate 99     Resp 18     Temp 98.1 F (36.7 C)     Temp Source Oral     SpO2 100 %     Weight 162 lb (73.5 kg)     Height 5\' 1"  (1.549 m)     Head Circumference      Peak Flow      Pain Score 0     Pain Loc      Pain Edu?      Excl. in [place]?      Constitutional: Alert and oriented. Well appearing and in no acute distress. Sitting comfortably in wheelchair. Eyes: Conjunctivae are normal. EOMI. Nose: No congestion/rhinnorhea. Mouth/Throat: Mucous membranes are moist.   Neck: No stridor. Cardiovascular: Regular rate and rhythm. Systolic murmur. Good peripheral circulation. Respiratory: Normal respiratory effort.  No retractions. Lungs  CTAB. Musculoskeletal: FROM throughout. No tenderness to palpation throughout back. Tenderness to palpation over left breast and left chest wall. Neurologic:  Normal speech and language. No gross focal neurologic deficits are appreciated. Skin:  1 cm brown mole on back. No rashes.  ____________________________________________   LABS (all labs ordered are listed, but only abnormal results are displayed)  Labs Reviewed  URINALYSIS COMPLETEWITH MICROSCOPIC ([facility] ONLY) - Abnormal; Notable for the following:       Result Value   Color, Urine YELLOW (*)    APPearance CLEAR (*)    Specific Gravity, Urine 1.004 (*)    Hgb urine dipstick 1+ (*)  Protein, ur 100 (*)    Bacteria, UA RARE (*)    Squamous Epithelial / LPF 0-5 (*)    All other components within normal limits   Clinical Course as of Nov 12 1756  Thu Nov 13, 2015  1742 Protein: Marland Kitchen 100 [AW]    Clinical Course User Index [AW] Laban Emperor, PA-C  Patient has Stage 3 CKD   INITIAL IMPRESSION / ASSESSMENT AND PLAN / ED COURSE  My assessment is that patient has musculoskeletal pain due to upper left chest wall tenderness. There is currently no back tenderness but patient states there was tenderness earlier. On physical exam, there does not appear to be an insect bite or rash on chest wall or back. Urinalysis and clinical presentation do not show evidence of of UTI. Upper left chest wall is tenderness to palpation so this is unlikely an MI. Symptoms have not occurred since coming to ED so I expect this to resolve.  Pertinent labs & imaging results that were available during my care of the patient were reviewed by me and considered in my medical decision making (see chart for details).  [name] was advised to follow up with PCP.  [name] was also advised to return to the emergency department for symptoms that change or worsen if unable to schedule an  appointment.  ____________________________________________   FINAL CLINICAL IMPRESSION(S) / ED DIAGNOSES  Final diagnoses:  Musculoskeletal pain    New Prescriptions   MELOXICAM (MOBIC) 15 MG TABLET    Take 1 tablet (15 mg total) by mouth daily.    Note:  This document was prepared using Dragon voice recognition software and may include unintentional dictation errors.   Laban Emperor, PA-C 11/13/15 [place], PA-C 11/13/15 [place], MD 11/13/15 [phone]

## 2015-11-13 NOTE — ED Triage Notes (Signed)
Pt felt a stinging sensation mid left back x2 today, no raised, swollen region noted, tenderness on palpation. Pt also feeling that the heart is racing, currently pulse palpated 99 and regular, chills,

## 2015-11-25 ENCOUNTER — Other Ambulatory Visit: Payer: Self-pay

## 2015-11-25 DIAGNOSIS — N390 Urinary tract infection, site not specified: Secondary | ICD-10-CM

## 2015-11-26 LAB — BASIC METABOLIC PANEL
BUN / CREAT RATIO: 11 (ref 9–23)
BUN: 17 mg/dL (ref 6–24)
CHLORIDE: 104 mmol/L (ref 96–106)
CO2: 18 mmol/L (ref 18–29)
CREATININE: 1.49 mg/dL — AB (ref 0.57–1.00)
Calcium: 9.4 mg/dL (ref 8.7–10.2)
GFR calc Af Amer: 49 mL/min/{1.73_m2} — ABNORMAL LOW (ref >59)
GFR calc non Af Amer: 43 mL/min/{1.73_m2} — ABNORMAL LOW (ref >59)
GLUCOSE: 103 mg/dL — AB (ref 65–99)
Potassium: 5.4 mmol/L — ABNORMAL HIGH (ref 3.5–5.2)
SODIUM: 142 mmol/L (ref 134–144)

## 2015-12-02 ENCOUNTER — Ambulatory Visit: Payer: Self-pay | Admitting: Family Medicine

## 2015-12-02 VITALS — BP 125/74 | HR 61 | Wt 180.0 lb

## 2015-12-02 DIAGNOSIS — L309 Dermatitis, unspecified: Secondary | ICD-10-CM | POA: Insufficient documentation

## 2015-12-02 DIAGNOSIS — N183 Chronic kidney disease, stage 3 unspecified: Secondary | ICD-10-CM

## 2015-12-02 DIAGNOSIS — R1011 Right upper quadrant pain: Secondary | ICD-10-CM | POA: Insufficient documentation

## 2015-12-02 MED ORDER — CLOBETASOL PROPIONATE 0.05 % EX CREA: 1.0000 "application " | TOPICAL_CREAM | Freq: Two times a day (BID) | CUTANEOUS | 0 refills | Status: DC

## 2015-12-02 NOTE — Assessment & Plan Note (Signed)
Avoid NSAIDs; stay hydrated; f/u with nephrologist; dose-adjust any medicine

## 2015-12-02 NOTE — Assessment & Plan Note (Signed)
Possible psoriasis; helped previously by steroids, so will Rx corticosteroids topically; do not apply to face, underarms, groin

## 2015-12-02 NOTE — Assessment & Plan Note (Addendum)
ddx discussed including cholecystitis or cholelithiasis and hepatitis; get labs and [place]; limit tylenol and alcohol until labs are back; reasons to go to the ER reviewed

## 2015-12-02 NOTE — Patient Instructions (Addendum)
We'll get labs today Avoid fatty foods, tylenol, alcohol until we get your test results back We'll get an [place] If you have not heard anything from my staff in a week about any orders/referrals/studies from today, please contact us here to follow-up [phone] Avoid NSAIDs

## 2015-12-02 NOTE — Progress Notes (Signed)
BP 125/74   Pulse 61   Wt 180 lb (81.6 kg)   BMI 34.01 kg/m    Subjective:    Patient ID: Gabrielle Schultz, female    DOB: 1970/05/02, 45 y.o.   MRN: [id number]  HPI: Gabrielle Schultz is a 45 y.o. female  Chief Complaint  Patient presents with  . Abdominal Pain   Right upper quadrant pain Going on for a week  Came on, thinks she got bitten by something; something holding a lit match to her back, that was on the  Comes and goes; worse with turning real quick, can happen to either side Nothing alleviates it Lasts 20-30 minutes Not affected by food No fevers Nothing similar Takes heartburn medicine; not worse Some nasal discharge, but no cough; only SHOB when in the shower and uses inhaler first  Rash on the right hand; the last time it broke out, she tried a friend's cream that is used to psoriasis, and it helped  CKD; creatinine elevated; seeing nephrologist; runs in the family; sister had HTN and kidney problems and died at age 81  Relevant past medical, surgical, family and social history reviewed Past Medical History:  Diagnosis Date  . Acid reflux   . Acid reflux   . Allergic rhinitis 06/26/2015  . Allergy   . Asthma   . Carpal tunnel syndrome    right hand  . Chronic kidney disease   . Colitis   . CVA (cerebral infarction)    2009, 2015  . History of syncope   . Hypertension 2008  . Low HDL (under 40) 06/26/2015  . MI (myocardial infarction)    2015  . Migraines   . Poor circulation   . Stroke ([place])   . Syncope and collapse    Past Surgical History:  Procedure Laterality Date  . CARDIAC CATHETERIZATION    . CESAREAN SECTION  2004  . COLONOSCOPY WITH PROPOFOL N/A 06/26/2014   Procedure: COLONOSCOPY WITH PROPOFOL;  Surgeon: Josefine Class, MD;  Location: [facility] ENDOSCOPY;  Service: Endoscopy;  Laterality: N/A;   Family History  Problem Relation Age of Onset  . Hyperlipidemia Mother   . Heart disease Mother   . COPD Mother   . Hypertension Father    . Heart disease Father   . Hyperlipidemia Father   . Hypertension Sister   . Kidney disease Sister   . Hypertension Brother   . Heart disease Brother    Social History  Substance Use Topics  . Smoking status: Current Every Day Smoker    Packs/day: 0.50    Years: 18.00    Types: Cigarettes  . Smokeless tobacco: Never Used  . Alcohol use Yes     Comment: "seldom" - drinks 350-725m when she does drink   Interim medical history since last visit reviewed. Allergies and medications reviewed  Review of Systems Per HPI unless specifically indicated above     Objective:    BP 125/74   Pulse 61   Wt 180 lb (81.6 kg)   BMI 34.01 kg/m   Wt Readings from Last 3 Encounters:  12/02/15 180 lb (81.6 kg)  11/13/15 162 lb (73.5 kg)  10/29/15 178 lb (80.7 kg)    Physical Exam  Constitutional: She appears well-developed and well-nourished. No distress.  Cardiovascular: Normal rate and regular rhythm.   Pulmonary/Chest: Effort normal and breath sounds normal.  Abdominal: Soft. Normal appearance and bowel sounds are normal. She exhibits no distension. There is tenderness in the  right upper quadrant. There is positive Murphy's sign. There is no rigidity and no guarding.  Skin: Rash noted. No pallor.     No jaundice; longitudinal ridging, few pits; rash at the base of the right palm was erythematous, no vesicles  Psychiatric: Her mood appears not anxious. She does not exhibit a depressed mood.    Results for orders placed or performed in visit on 18/84/16  Basic Metabolic Panel (BMET)  Result Value Ref Range   Glucose 103 (H) 65 - 99 mg/dL   BUN 17 6 - 24 mg/dL   Creatinine, Ser 1.49 (H) 0.57 - 1.00 mg/dL   GFR calc non Af Amer 43 (L) >59 mL/min/1.73   GFR calc Af Amer 49 (L) >59 mL/min/1.73   BUN/Creatinine Ratio 11 9 - 23   Sodium 142 134 - 144 mmol/L   Potassium 5.4 (H) 3.5 - 5.2 mmol/L   Chloride 104 96 - 106 mmol/L   CO2 18 18 - 29 mmol/L   Calcium 9.4 8.7 - 10.2 mg/dL       Assessment & Plan:   Problem List Items Addressed This Visit      Musculoskeletal and Integument   Dermatitis    Possible psoriasis; helped previously by steroids, so will Rx corticosteroids topically; do not apply to face, underarms, groin      Relevant Orders   CBC with Differential   Comp Met (CMET)   Hepatitis, Acute     Genitourinary   Chronic kidney disease (CKD), stage III (moderate)    Avoid NSAIDs; stay hydrated; f/u with nephrologist; dose-adjust any medicine      Relevant Orders   CBC with Differential   Comp Met (CMET)   Hepatitis, Acute     Other   Abdominal pain, right upper quadrant - Primary    ddx discussed including cholecystitis or cholelithiasis and hepatitis; get labs and [place]; limit tylenol and alcohol until labs are back; reasons to go to the ER reviewed      Relevant Orders   US Abdomen Limited RUQ   CBC with Differential   Comp Met (CMET)   Hepatitis, Acute       Follow up plan: No Follow-up on file.  An after-visit summary was printed and given to the patient at [place].  Please see the patient instructions which may contain other information and recommendations beyond what is mentioned above in the assessment and plan.  Meds ordered this encounter  Medications  . clobetasol cream (TEMOVATE) 0.05 %    Sig: Apply 1 application topically 2 (two) times daily. If needed; too strong for face, groin, underarms    Dispense:  60 g    Refill:  0    Orders Placed This Encounter  Procedures  . US Abdomen Limited RUQ  . CBC with Differential  . Comp Met (CMET)  . Hepatitis, Acute

## 2015-12-03 LAB — CBC WITH DIFFERENTIAL/PLATELET
BASOS ABS: 0 10*3/uL (ref 0.0–0.2)
Basos: 0 %
EOS (ABSOLUTE): 0.3 10*3/uL (ref 0.0–0.4)
Eos: 4 %
HEMOGLOBIN: 12.9 g/dL (ref 11.1–15.9)
Hematocrit: 39 % (ref 34.0–46.6)
IMMATURE GRANS (ABS): 0 10*3/uL (ref 0.0–0.1)
Immature Granulocytes: 0 %
LYMPHS: 32 %
Lymphocytes Absolute: 2.6 10*3/uL (ref 0.7–3.1)
MCH: 30.1 pg (ref 26.6–33.0)
MCHC: 33.1 g/dL (ref 31.5–35.7)
MCV: 91 fL (ref 79–97)
MONOCYTES: 5 %
Monocytes Absolute: 0.4 10*3/uL (ref 0.1–0.9)
NEUTROS ABS: 5 10*3/uL (ref 1.4–7.0)
NEUTROS PCT: 59 %
PLATELETS: 222 10*3/uL (ref 150–379)
RBC: 4.28 x10E6/uL (ref 3.77–5.28)
RDW: 13.7 % (ref 12.3–15.4)
WBC: 8.3 10*3/uL (ref 3.4–10.8)

## 2015-12-03 LAB — COMPREHENSIVE METABOLIC PANEL
ALBUMIN: 3.9 g/dL (ref 3.5–5.5)
ALT: 8 IU/L (ref 0–32)
AST: 6 IU/L (ref 0–40)
Albumin/Globulin Ratio: 1.3 (ref 1.2–2.2)
Alkaline Phosphatase: 126 IU/L — ABNORMAL HIGH (ref 39–117)
BUN/Creatinine Ratio: 9 (ref 9–23)
BUN: 12 mg/dL (ref 6–24)
Bilirubin Total: 0.3 mg/dL (ref 0.0–1.2)
CO2: 20 mmol/L (ref 18–29)
Calcium: 8.8 mg/dL (ref 8.7–10.2)
Chloride: 104 mmol/L (ref 96–106)
Creatinine, Ser: 1.34 mg/dL — ABNORMAL HIGH (ref 0.57–1.00)
GFR calc Af Amer: 56 mL/min/{1.73_m2} — ABNORMAL LOW (ref >59)
GFR, EST NON AFRICAN AMERICAN: 49 mL/min/{1.73_m2} — AB (ref >59)
GLUCOSE: 86 mg/dL (ref 65–99)
Globulin, Total: 2.9 g/dL (ref 1.5–4.5)
POTASSIUM: 4.8 mmol/L (ref 3.5–5.2)
SODIUM: 139 mmol/L (ref 134–144)
TOTAL PROTEIN: 6.8 g/dL (ref 6.0–8.5)

## 2015-12-03 LAB — HEPATITIS PANEL, ACUTE
HEP B S AG: NEGATIVE
Hep A IgM: NEGATIVE
Hep B C IgM: NEGATIVE
Hep C Virus Ab: <0.1 s/co ratio (ref 0.0–0.9)

## 2015-12-04 ENCOUNTER — Other Ambulatory Visit: Payer: Self-pay | Admitting: Adult Health Nurse Practitioner

## 2015-12-04 MED ORDER — MOMETASONE FUROATE 0.1 % EX CREA: 1.0000 "application " | TOPICAL_CREAM | Freq: Every day | CUTANEOUS | 0 refills | Status: DC

## 2015-12-12 ENCOUNTER — Other Ambulatory Visit: Payer: Self-pay | Admitting: Internal Medicine

## 2015-12-12 ENCOUNTER — Other Ambulatory Visit: Payer: Self-pay | Admitting: Nurse Practitioner

## 2015-12-12 DIAGNOSIS — D689 Coagulation defect, unspecified: Secondary | ICD-10-CM

## 2016-01-08 ENCOUNTER — Ambulatory Visit: Payer: Self-pay

## 2016-01-22 ENCOUNTER — Ambulatory Visit: Payer: Self-pay

## 2016-02-05 ENCOUNTER — Ambulatory Visit: Payer: Self-pay

## 2016-02-16 ENCOUNTER — Telehealth: Payer: Self-pay | Admitting: Pharmacist

## 2016-02-16 NOTE — Telephone Encounter (Signed)
Called [facility] for refill on Ventolin.

## 2016-02-19 ENCOUNTER — Encounter: Payer: Self-pay | Admitting: Pharmacist

## 2016-02-23 ENCOUNTER — Ambulatory Visit: Payer: Self-pay

## 2016-03-01 ENCOUNTER — Ambulatory Visit: Payer: Self-pay | Admitting: Pharmacist

## 2016-03-01 ENCOUNTER — Encounter: Payer: Self-pay | Admitting: Pharmacist

## 2016-03-01 VITALS — BP 150/70 | Ht 61.0 in | Wt 179.0 lb

## 2016-03-01 DIAGNOSIS — Z79899 Other long term (current) drug therapy: Secondary | ICD-10-CM
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
## 2016-03-01 NOTE — Patient Instructions (Signed)
Call PCP for refills of simvastatin and clonidine. Limit salt and soda intake. Exercise once the weather starts getting warmer.

## 2016-03-02 ENCOUNTER — Other Ambulatory Visit: Payer: Self-pay | Admitting: Adult Health Nurse Practitioner

## 2016-03-02 ENCOUNTER — Other Ambulatory Visit: Payer: Self-pay

## 2016-03-02 DIAGNOSIS — D689 Coagulation defect, unspecified: Secondary | ICD-10-CM

## 2016-03-02 MED ORDER — SIMVASTATIN 20 MG PO TABS: 20.0000 mg | ORAL_TABLET | Freq: Every day | ORAL | 0 refills | Status: DC

## 2016-03-02 MED ORDER — CETIRIZINE HCL 10 MG PO TABS: 10.0000 mg | ORAL_TABLET | Freq: Every day | ORAL | 5 refills | Status: DC

## 2016-03-04 ENCOUNTER — Other Ambulatory Visit: Payer: Self-pay

## 2016-03-04 VITALS — BP 140/74 | HR 74

## 2016-03-04 DIAGNOSIS — E119 Type 2 diabetes mellitus without complications: Secondary | ICD-10-CM

## 2016-03-05 LAB — COMPREHENSIVE METABOLIC PANEL
A/G RATIO: 1.4 (ref 1.2–2.2)
ALBUMIN: 3.8 g/dL (ref 3.5–5.5)
ALT: 9 IU/L (ref 0–32)
AST: 8 IU/L (ref 0–40)
Alkaline Phosphatase: 90 IU/L (ref 39–117)
BUN/Creatinine Ratio: 11 (ref 9–23)
BUN: 15 mg/dL (ref 6–24)
Bilirubin Total: <0.2 mg/dL (ref 0.0–1.2)
CO2: 18 mmol/L (ref 18–29)
Calcium: 9.1 mg/dL (ref 8.7–10.2)
Chloride: 102 mmol/L (ref 96–106)
Creatinine, Ser: 1.34 mg/dL — ABNORMAL HIGH (ref 0.57–1.00)
GFR, EST AFRICAN AMERICAN: 56 mL/min/{1.73_m2} — AB (ref >59)
GFR, EST NON AFRICAN AMERICAN: 49 mL/min/{1.73_m2} — AB (ref >59)
Globulin, Total: 2.7 g/dL (ref 1.5–4.5)
Glucose: 90 mg/dL (ref 65–99)
POTASSIUM: 5.3 mmol/L — AB (ref 3.5–5.2)
Sodium: 137 mmol/L (ref 134–144)
Total Protein: 6.5 g/dL (ref 6.0–8.5)

## 2016-03-05 LAB — HEMOGLOBIN A1C
ESTIMATED AVERAGE GLUCOSE: 105 mg/dL
Hgb A1c MFr Bld: 5.3 % (ref 4.8–5.6)

## 2016-03-05 LAB — TSH: TSH: 0.27 u[IU]/mL — ABNORMAL LOW (ref 0.450–4.500)

## 2016-03-11 ENCOUNTER — Ambulatory Visit: Payer: Self-pay | Admitting: Urology

## 2016-03-11 VITALS — BP 161/80 | HR 67 | Temp 98.7°F | Wt 179.9 lb

## 2016-03-11 DIAGNOSIS — N183 Chronic kidney disease, stage 3 unspecified: Secondary | ICD-10-CM

## 2016-03-11 DIAGNOSIS — R7989 Other specified abnormal findings of blood chemistry: Secondary | ICD-10-CM

## 2016-03-11 DIAGNOSIS — E7849 Other hyperlipidemia: Secondary | ICD-10-CM

## 2016-03-11 DIAGNOSIS — R5383 Other fatigue: Secondary | ICD-10-CM

## 2016-03-11 MED ORDER — SODIUM BICARBONATE 650 MG PO TABS: 650.0000 mg | ORAL_TABLET | Freq: Two times a day (BID) | ORAL | 0 refills | Status: DC

## 2016-03-11 MED ORDER — SIMVASTATIN 20 MG PO TABS: 20.0000 mg | ORAL_TABLET | Freq: Every day | ORAL | 0 refills | Status: DC

## 2016-03-11 MED ORDER — AMLODIPINE BESYLATE 10 MG PO TABS: 10.0000 mg | ORAL_TABLET | Freq: Every day | ORAL | 2 refills | Status: DC

## 2016-03-11 MED ORDER — OMEPRAZOLE 20 MG PO CPDR: 20.0000 mg | DELAYED_RELEASE_CAPSULE | Freq: Every day | ORAL | 0 refills | Status: DC

## 2016-03-11 NOTE — Progress Notes (Signed)
Patient: Gabrielle Schultz Female    DOB: 1971-01-03   46 y.o.   MRN: [id number] Visit Date: 03/11/2016  Today's Provider: [place]   Chief Complaint  Patient presents with  . Follow-up   Subjective:    HPI 46 yo WF who presents today for follow up.  She has terminated her relationship with her nephrologist.  She is non compliant with recommendations such as avoiding dietary suggestions.  She continues to eat foods and drink juice that contain potassium.    TSH is low.  Needs Thyroid blood panel.    Allergies  Allergen Reactions  . Coconut Oil Swelling  . Amoxicillin Rash  . Penicillins Rash   Previous Medications   ASPIRIN 81 MG TABLET    Take 81 mg by mouth daily.   BECLOMETHASONE (QVAR) 40 MCG/ACT INHALER    Inhale 1 puff into the lungs 2 (two) times daily.   CETIRIZINE (ZYRTEC) 10 MG TABLET    Take 1 tablet (10 mg total) by mouth daily.   CLONIDINE (CATAPRES) 0.1 MG TABLET    2 TABLETS BY MOUTH TWICE A DAY   CLONIDINE (CATAPRES) 0.3 MG TABLET    TAKE ONE TABLET BY MOUTH 2 TIMES A DAY   FLUTICASONE (FLONASE) 50 MCG/ACT NASAL SPRAY    Place 1 spray into both nostrils 2 (two) times daily.   LOPERAMIDE (IMODIUM) 2 MG CAPSULE    Take 2 mg by mouth as needed for diarrhea or loose stools (about once a week).   MOMETASONE (ELOCON) 0.1 % CREAM    Apply 1 application topically daily.   RANITIDINE (ZANTAC) 150 MG TABLET    Take 150 mg by mouth 2 (two) times daily.    SODIUM BICARBONATE 650 MG TABLET    Take 650 mg by mouth.   VENTOLIN HFA 108 (90 BASE) MCG/ACT INHALER    INHALE 2 PUFFS EVERY FOUR HOURS AS NEEDED FOR COUGH AND WHEEZING.    Review of Systems  Constitutional: Negative.   HENT: Negative.   Eyes: Negative.   Respiratory: Negative.   Cardiovascular: Negative.   Gastrointestinal: Negative.   Endocrine: Negative.   Genitourinary: Negative.   Musculoskeletal: Negative.   Skin: Negative.   Allergic/Immunologic: Negative.   Neurological: Negative.    Hematological: Negative.   Psychiatric/Behavioral: Negative.     Social History  Substance Use Topics  . Smoking status: Current Every Day Smoker    Packs/day: 0.50    Years: 18.00    Types: Cigarettes  . Smokeless tobacco: Never Used  . Alcohol use Yes     Comment: "seldom" - drinks 350-771mL when she does drink   Objective:   BP (!) 161/80   Pulse 67   Temp 98.7 F (37.1 C)   Wt 179 lb 14.4 oz (81.6 kg)   BMI 33.99 kg/m   Physical Exam Constitutional: Well nourished. Alert and oriented, No acute distress. HEENT: [place] AT, moist mucus membranes. Trachea midline, no masses. Cardiovascular: No clubbing, cyanosis, or edema. Respiratory: Normal respiratory effort, no increased work of breathing. Skin: No rashes, bruises or suspicious lesions. Lymph: No cervical or inguinal adenopathy. Neurologic: Grossly intact, no focal deficits, moving all 4 extremities. Psychiatric: Normal mood and affect.      Assessment & Plan:    1. CKD  - needs a new nephrologist  - patient to fill out charity care for [facility]  2. HTN  - uncontrolled  - ? Thyroid issues  - thyroid pending  3. HLD  -  continue simvastatin 20 mg qhs          [facility]   [facility]

## 2016-03-16 ENCOUNTER — Other Ambulatory Visit: Payer: Self-pay

## 2016-03-16 DIAGNOSIS — R7989 Other specified abnormal findings of blood chemistry: Secondary | ICD-10-CM

## 2016-03-17 LAB — THYROID PANEL
Free Thyroxine Index: 1.9 (ref 1.2–4.9)
T3 Uptake Ratio: 23 % — ABNORMAL LOW (ref 24–39)
T4, Total: 8.1 ug/dL (ref 4.5–12.0)

## 2016-03-31 ENCOUNTER — Ambulatory Visit: Payer: Self-pay | Admitting: Pharmacy Technician

## 2016-03-31 NOTE — Progress Notes (Signed)
Patient scheduled for eligibility appointment at [facility].  Patient did not show for the appointment on 03/31/16 at 2:00p.m.  Patient did not reschedule eligibility appointment.  [facility] will be unable to provide medication assistance until eligibility is determined.  [place] [facility]

## 2016-04-22 ENCOUNTER — Ambulatory Visit: Payer: Self-pay | Admitting: Adult Health Nurse Practitioner

## 2016-04-22 VITALS — BP 138/72 | HR 66 | Temp 98.0°F | Wt 186.8 lb

## 2016-04-22 DIAGNOSIS — I10 Essential (primary) hypertension: Secondary | ICD-10-CM

## 2016-04-22 DIAGNOSIS — R600 Localized edema: Secondary | ICD-10-CM

## 2016-04-22 DIAGNOSIS — R7989 Other specified abnormal findings of blood chemistry: Secondary | ICD-10-CM

## 2016-04-22 MED ORDER — HYDROCHLOROTHIAZIDE 12.5 MG PO CAPS: 12.5000 mg | ORAL_CAPSULE | Freq: Every day | ORAL | 2 refills | Status: DC

## 2016-04-22 NOTE — Progress Notes (Signed)
Patient: Gabrielle Schultz Female    DOB: [birth year]   46 y.o.   MRN: [id number] Visit Date: 04/22/2016  Today's Provider: Staci Acosta, NP   Chief Complaint  Patient presents with  . Foot Swelling    past 3 months, no longer takes fluid medication   Subjective:    HPI  Pt states she was taken off her "fluid pill" by her previous doctor due to her colitis.  Pt states that her foot will swell to the point where she can not stand on it.  Pt states that she cannot wear compression hose because the "cant stand anything on her feet"   Reviewed TSH- and thyroid profile.     Allergies  Allergen Reactions  . Coconut Oil Swelling  . Amoxicillin Rash  . Penicillins Rash   Previous Medications   AMLODIPINE (NORVASC) 10 MG TABLET    Take 1 tablet (10 mg total) by mouth daily.   ASPIRIN 81 MG TABLET    Take 81 mg by mouth daily.   BECLOMETHASONE (QVAR) 40 MCG/ACT INHALER    Inhale 1 puff into the lungs 2 (two) times daily.   CETIRIZINE (ZYRTEC) 10 MG TABLET    Take 1 tablet (10 mg total) by mouth daily.   CLONIDINE (CATAPRES) 0.1 MG TABLET    2 TABLETS BY MOUTH TWICE A DAY   CLONIDINE (CATAPRES) 0.3 MG TABLET    TAKE ONE TABLET BY MOUTH 2 TIMES A DAY   FLUTICASONE (FLONASE) 50 MCG/ACT NASAL SPRAY    Place 1 spray into both nostrils 2 (two) times daily.   LOPERAMIDE (IMODIUM) 2 MG CAPSULE    Take 2 mg by mouth as needed for diarrhea or loose stools (about once a week).   MOMETASONE (ELOCON) 0.1 % CREAM    Apply 1 application topically daily.   OMEPRAZOLE (PRILOSEC) 20 MG CAPSULE    Take 1 capsule (20 mg total) by mouth daily.   RANITIDINE (ZANTAC) 150 MG TABLET    Take 150 mg by mouth 2 (two) times daily.    SIMVASTATIN (ZOCOR) 20 MG TABLET    Take 1 tablet (20 mg total) by mouth at bedtime.   SODIUM BICARBONATE 650 MG TABLET    Take 650 mg by mouth.   SODIUM BICARBONATE 650 MG TABLET    Take 1 tablet (650 mg total) by mouth 2 (two) times daily.   VENTOLIN HFA 108 (90 BASE) MCG/ACT  INHALER    INHALE 2 PUFFS EVERY FOUR HOURS AS NEEDED FOR COUGH AND WHEEZING.    Review of Systems  All other systems reviewed and are negative.   Social History  Substance Use Topics  . Smoking status: Current Every Day Smoker    Packs/day: 0.50    Years: 18.00    Types: Cigarettes  . Smokeless tobacco: Never Used  . Alcohol use Yes     Comment: "seldom" - drinks 350-780mL when she does drink   Objective:   BP 138/72   Pulse 66   Temp 98 F (36.7 C)   Wt 186 lb 12.8 oz (84.7 kg)   BMI 35.30 kg/m   Physical Exam  Constitutional: She appears well-developed and well-nourished.  Cardiovascular: Normal rate, regular rhythm, normal heart sounds and intact distal pulses.   BLE ruddy in complexion with 1+ pitting edema.   Pulmonary/Chest: Effort normal and breath sounds normal.  Vitals reviewed.       Assessment & Plan:      HTN with fluid retention:  BP stable.  Elevate legs/feet with sitting.  Encouraged compression hose.  HCTZ 12.5 daily.  FU in 3 weeks for repeat CMET.  Repeat TSH at that time as well.         Staci Acosta, NP   [facility]

## 2016-05-11 ENCOUNTER — Telehealth: Payer: Self-pay | Admitting: Cardiovascular Disease

## 2016-05-11 NOTE — Telephone Encounter (Signed)
Received records request from [facility] , forwarded to [facility] for processing.

## 2016-05-13 ENCOUNTER — Other Ambulatory Visit: Payer: Self-pay

## 2016-05-13 DIAGNOSIS — R7989 Other specified abnormal findings of blood chemistry: Secondary | ICD-10-CM

## 2016-05-14 LAB — COMPREHENSIVE METABOLIC PANEL
ALBUMIN: 2.9 g/dL — AB (ref 3.5–5.5)
ALT: 7 IU/L (ref 0–32)
AST: 9 IU/L (ref 0–40)
Albumin/Globulin Ratio: 1 — ABNORMAL LOW (ref 1.2–2.2)
Alkaline Phosphatase: 123 IU/L — ABNORMAL HIGH (ref 39–117)
BUN / CREAT RATIO: 10 (ref 9–23)
BUN: 13 mg/dL (ref 6–24)
Bilirubin Total: <0.2 mg/dL (ref 0.0–1.2)
CO2: 19 mmol/L (ref 18–29)
CREATININE: 1.24 mg/dL — AB (ref 0.57–1.00)
Calcium: 9 mg/dL (ref 8.7–10.2)
Chloride: 103 mmol/L (ref 96–106)
GFR calc non Af Amer: 53 mL/min/{1.73_m2} — ABNORMAL LOW (ref >59)
GFR, EST AFRICAN AMERICAN: 61 mL/min/{1.73_m2} (ref >59)
GLOBULIN, TOTAL: 3 g/dL (ref 1.5–4.5)
GLUCOSE: 89 mg/dL (ref 65–99)
Potassium: 4.5 mmol/L (ref 3.5–5.2)
Sodium: 138 mmol/L (ref 134–144)
TOTAL PROTEIN: 5.9 g/dL — AB (ref 6.0–8.5)

## 2016-05-14 LAB — TSH: TSH: 0.948 u[IU]/mL (ref 0.450–4.500)

## 2016-05-27 ENCOUNTER — Ambulatory Visit: Payer: Self-pay | Admitting: Family Medicine

## 2016-05-27 VITALS — BP 179/76 | HR 71 | Wt 194.5 lb

## 2016-05-27 DIAGNOSIS — Z720 Tobacco use: Secondary | ICD-10-CM

## 2016-05-27 DIAGNOSIS — I10 Essential (primary) hypertension: Secondary | ICD-10-CM

## 2016-05-27 NOTE — Progress Notes (Signed)
Subjective:     Patient ID: Gabrielle Schultz, female   DOB: 12/30/1970, 46 y.o.   MRN: [id number]  HPI F/u today. Cramp in right calf.  Review of Systems Calf strain/pain Swollen feet    Objective:   Physical Exam  Wt--194  BP 179/76  HR 72 Chest --clear Lungs -clear Calf--no swelling/negative Homans/no cords Right lateral visual field defect    Assessment:     S/p CVA 2014 HTN Restart HCTZ HLD Tobacco Abuse Instucted to stop.    I have done the exam and reviewed the chart and it is accurate to the best of my knowledge. Development worker, community has been used and  any errors in dictation or transcription are unintentional. Miguel Aschoff M.D. [facility]  Plan:     Restart HCTZ. Pt has not yet picked up prescription. Stop smoking. Repeat labs and recheck 1 month

## 2016-06-03 ENCOUNTER — Other Ambulatory Visit: Payer: Self-pay

## 2016-06-03 MED ORDER — HYDROCHLOROTHIAZIDE 12.5 MG PO CAPS: 12.5000 mg | ORAL_CAPSULE | Freq: Every day | ORAL | 2 refills | Status: DC

## 2016-06-03 MED ORDER — CLONIDINE HCL 0.3 MG PO TABS: ORAL_TABLET | ORAL | 0 refills | Status: DC

## 2016-06-03 NOTE — Telephone Encounter (Signed)
Patient walked in requesting refill on clonidine 0.3mg  and HCTZ.  Requests RXs be sent to [place].

## 2016-06-17 ENCOUNTER — Ambulatory Visit: Payer: Self-pay | Admitting: Ophthalmology

## 2016-06-21 ENCOUNTER — Other Ambulatory Visit: Payer: Self-pay | Admitting: Internal Medicine

## 2016-06-21 ENCOUNTER — Ambulatory Visit: Payer: Self-pay | Admitting: Pharmacy Technician

## 2016-06-21 ENCOUNTER — Telehealth: Payer: Self-pay | Admitting: Pharmacy Technician

## 2016-06-21 NOTE — Telephone Encounter (Signed)
Patient eligible to receive medication assistance at [facility] until 03/04/17 as long as eligibility requirements continue to be met.  Completed Ventolin PAP application with patient.  Prescription will be submitted to [place] once signed by provider and returned by [facility].  [place] [facility]

## 2016-06-25 ENCOUNTER — Telehealth: Payer: Self-pay

## 2016-06-25 NOTE — Telephone Encounter (Signed)
Received PAP application from [facility] for Ventolin placed for provider to sign.

## 2016-06-25 NOTE — Telephone Encounter (Signed)
Placed signed application/script in [facility] folder for pickup. 

## 2016-06-30 ENCOUNTER — Telehealth: Payer: Self-pay | Admitting: Pharmacist

## 2016-06-30 NOTE — Telephone Encounter (Signed)
06/30/16 Received Ventolin HFA script from [facility], faxing to [place], appears [facility] faxed application 7/61/51.

## 2016-07-01 ENCOUNTER — Ambulatory Visit: Payer: Self-pay | Admitting: Adult Health Nurse Practitioner

## 2016-07-01 VITALS — BP 155/72 | HR 83 | Temp 99.2°F | Wt 199.3 lb

## 2016-07-01 DIAGNOSIS — I10 Essential (primary) hypertension: Secondary | ICD-10-CM

## 2016-07-01 NOTE — Progress Notes (Signed)
Patient: Gabrielle Schultz Female    DOB: 10-Sep-1970   46 y.o.   MRN: [id number] Visit Date: 07/01/2016  Today's Provider: Staci Acosta, NP   Chief Complaint  Patient presents with  . Follow-up   Subjective:    HPI  Last visit BP was 179/76, improved but still not at goal.  HCTZ 12.5 was restarted.  Pt states that she is tolerating the medication well.  Denies dizziness, ha.     Allergies  Allergen Reactions  . Coconut Oil Swelling  . Amoxicillin Rash  . Penicillins Rash   Previous Medications   AMLODIPINE (NORVASC) 10 MG TABLET    Take 1 tablet (10 mg total) by mouth daily.   ASPIRIN 81 MG TABLET    Take 81 mg by mouth daily.   BECLOMETHASONE (QVAR) 40 MCG/ACT INHALER    Inhale 1 puff into the lungs 2 (two) times daily.   CETIRIZINE (ZYRTEC) 10 MG TABLET    Take 1 tablet (10 mg total) by mouth daily.   CLONIDINE (CATAPRES) 0.1 MG TABLET    2 TABLETS BY MOUTH TWICE A DAY   CLONIDINE (CATAPRES) 0.3 MG TABLET    TAKE ONE TABLET BY MOUTH 2 TIMES A DAY   CLONIDINE (CATAPRES) 0.3 MG TABLET    TAKE ONE TABLET BY MOUTH 2 TIMES A DAY   FLUTICASONE (FLONASE) 50 MCG/ACT NASAL SPRAY    Place 1 spray into both nostrils 2 (two) times daily.   HYDROCHLOROTHIAZIDE (MICROZIDE) 12.5 MG CAPSULE    Take 1 capsule (12.5 mg total) by mouth daily.   LISINOPRIL (PRINIVIL,ZESTRIL) 20 MG TABLET    TAKE ONE TABLET BY MOUTH EVERY DAY AT BEDTIME   LOPERAMIDE (IMODIUM) 2 MG CAPSULE    Take 2 mg by mouth as needed for diarrhea or loose stools (about once a week).   MOMETASONE (ELOCON) 0.1 % CREAM    Apply 1 application topically daily.   OMEPRAZOLE (PRILOSEC) 20 MG CAPSULE    Take 1 capsule (20 mg total) by mouth daily.   RANITIDINE (ZANTAC) 150 MG TABLET    TAKE ONE TABLET BY MOUTH 2 TIMES A DAY   SIMVASTATIN (ZOCOR) 20 MG TABLET    Take 1 tablet (20 mg total) by mouth at bedtime.   SODIUM BICARBONATE 650 MG TABLET    Take 650 mg by mouth.   SODIUM BICARBONATE 650 MG TABLET    Take 1 tablet (650 mg  total) by mouth 2 (two) times daily.   VENTOLIN HFA 108 (90 BASE) MCG/ACT INHALER    INHALE 2 PUFFS EVERY FOUR HOURS AS NEEDED FOR COUGH AND WHEEZING.    Review of Systems  All other systems reviewed and are negative.   Social History  Substance Use Topics  . Smoking status: Current Every Day Smoker    Packs/day: 0.50    Years: 18.00    Types: Cigarettes  . Smokeless tobacco: Never Used  . Alcohol use Yes     Comment: "seldom" - drinks 350-786mL when she does drink   Objective:   BP (!) 155/72   Pulse 83   Temp 99.2 F (37.3 C)   Wt 199 lb 4.8 oz (90.4 kg)   LMP 01/05/2016   BMI 37.66 kg/m   Physical Exam  Constitutional: She appears well-developed and well-nourished.  HENT:  Head: Normocephalic and atraumatic.  Cardiovascular: Normal rate and regular rhythm.   Murmur heard. Pulmonary/Chest: Effort normal and breath sounds normal.  Musculoskeletal: She exhibits edema.  1+ pedal edema.  Skin: Skin is warm and dry.  Vitals reviewed.       Assessment & Plan:        Increase HCTZ to 25mg  daily.  FU in 4 weeks and repeat BMET.   BMP today.    Staci Acosta, NP   [facility]

## 2016-07-02 LAB — BASIC METABOLIC PANEL
BUN / CREAT RATIO: 14 (ref 9–23)
BUN: 21 mg/dL (ref 6–24)
CO2: 15 mmol/L — ABNORMAL LOW (ref 20–29)
Calcium: 8.9 mg/dL (ref 8.7–10.2)
Chloride: 102 mmol/L (ref 96–106)
Creatinine, Ser: 1.55 mg/dL — ABNORMAL HIGH (ref 0.57–1.00)
GFR calc non Af Amer: 41 mL/min/{1.73_m2} — ABNORMAL LOW (ref >59)
GFR, EST AFRICAN AMERICAN: 47 mL/min/{1.73_m2} — AB (ref >59)
Glucose: 83 mg/dL (ref 65–99)
POTASSIUM: 5.9 mmol/L — AB (ref 3.5–5.2)
Sodium: 134 mmol/L (ref 134–144)

## 2016-07-20 ENCOUNTER — Emergency Department: Payer: Medicaid Other

## 2016-07-20 ENCOUNTER — Emergency Department
Admission: EM | Admit: 2016-07-20 | Discharge: 2016-07-20 | Disposition: A | Discharge status: Home / Self Care | Payer: Medicaid Other | Attending: Emergency Medicine | Admitting: Emergency Medicine

## 2016-07-20 ENCOUNTER — Encounter: Payer: Self-pay | Admitting: Emergency Medicine

## 2016-07-20 DIAGNOSIS — R109 Unspecified abdominal pain: Secondary | ICD-10-CM | POA: Diagnosis not present

## 2016-07-20 DIAGNOSIS — J45909 Unspecified asthma, uncomplicated: Secondary | ICD-10-CM | POA: Diagnosis not present

## 2016-07-20 DIAGNOSIS — Z79899 Other long term (current) drug therapy: Secondary | ICD-10-CM | POA: Insufficient documentation

## 2016-07-20 DIAGNOSIS — F17210 Nicotine dependence, cigarettes, uncomplicated: Secondary | ICD-10-CM | POA: Diagnosis not present

## 2016-07-20 DIAGNOSIS — N183 Chronic kidney disease, stage 3 (moderate): Secondary | ICD-10-CM | POA: Diagnosis not present

## 2016-07-20 DIAGNOSIS — Z8673 Personal history of transient ischemic attack (TIA), and cerebral infarction without residual deficits: Secondary | ICD-10-CM | POA: Diagnosis not present

## 2016-07-20 DIAGNOSIS — I129 Hypertensive chronic kidney disease with stage 1 through stage 4 chronic kidney disease, or unspecified chronic kidney disease: Secondary | ICD-10-CM | POA: Diagnosis not present

## 2016-07-20 DIAGNOSIS — Z7982 Long term (current) use of aspirin: Secondary | ICD-10-CM | POA: Insufficient documentation

## 2016-07-20 DIAGNOSIS — R52 Pain, unspecified: Secondary | ICD-10-CM

## 2016-07-20 DIAGNOSIS — Z3493 Encounter for supervision of normal pregnancy, unspecified, third trimester: Secondary | ICD-10-CM | POA: Insufficient documentation

## 2016-07-20 DIAGNOSIS — R197 Diarrhea, unspecified: Secondary | ICD-10-CM | POA: Diagnosis present

## 2016-07-20 HISTORY — DX: Disorder of kidney and ureter, unspecified: N28.9

## 2016-07-20 LAB — CBC
HCT: 33.1 % — ABNORMAL LOW (ref 35.0–47.0)
Hemoglobin: 11.3 g/dL — ABNORMAL LOW (ref 12.0–16.0)
MCH: 31.8 pg (ref 26.0–34.0)
MCHC: 34 g/dL (ref 32.0–36.0)
MCV: 93.7 fL (ref 80.0–100.0)
PLATELETS: 170 10*3/uL (ref 150–440)
RBC: 3.53 MIL/uL — AB (ref 3.80–5.20)
RDW: 13 % (ref 11.5–14.5)
WBC: 13.6 10*3/uL — ABNORMAL HIGH (ref 3.6–11.0)

## 2016-07-20 LAB — COMPREHENSIVE METABOLIC PANEL
ALK PHOS: 103 U/L (ref 38–126)
ALT: 13 U/L — AB (ref 14–54)
AST: 25 U/L (ref 15–41)
Albumin: 2.5 g/dL — ABNORMAL LOW (ref 3.5–5.0)
Anion gap: 8 (ref 5–15)
BUN: 20 mg/dL (ref 6–20)
CALCIUM: 8.8 mg/dL — AB (ref 8.9–10.3)
CHLORIDE: 105 mmol/L (ref 101–111)
CO2: 20 mmol/L — AB (ref 22–32)
CREATININE: 1.71 mg/dL — AB (ref 0.44–1.00)
GFR calc Af Amer: 41 mL/min — ABNORMAL LOW (ref >60)
GFR, EST NON AFRICAN AMERICAN: 36 mL/min — AB (ref >60)
Glucose, Bld: 115 mg/dL — ABNORMAL HIGH (ref 65–99)
Potassium: 4 mmol/L (ref 3.5–5.1)
Sodium: 133 mmol/L — ABNORMAL LOW (ref 135–145)
Total Bilirubin: 0.3 mg/dL (ref 0.3–1.2)
Total Protein: 6.4 g/dL — ABNORMAL LOW (ref 6.5–8.1)

## 2016-07-20 LAB — LIPASE, BLOOD: LIPASE: 20 U/L (ref 11–51)

## 2016-07-20 LAB — LACTIC ACID, PLASMA: LACTIC ACID, VENOUS: 1 mmol/L (ref 0.5–1.9)

## 2016-07-20 MED ORDER — FENTANYL CITRATE (PF) 100 MCG/2ML IJ SOLN
100.0000 ug | Freq: Once | INTRAMUSCULAR | Status: AC
  Administered 2016-07-20: 100 ug via INTRAVENOUS
  Filled 2016-07-20: qty 2

## 2016-07-20 MED ORDER — IOPAMIDOL (ISOVUE-370) INJECTION 76%
75.0000 mL | Freq: Once | INTRAVENOUS | Status: AC | PRN
  Administered 2016-07-20: 75 mL via INTRAVENOUS
  Filled 2016-07-20: qty 75

## 2016-07-20 NOTE — ED Triage Notes (Signed)
Pt to ed via ems from home with c/o abd pain, hx of colitis with diarrhea.  Pt reports antidiarrhea meds are no longer working.

## 2016-07-20 NOTE — Discharge Instructions (Addendum)
Please make an appointment to follow-up with [facility] gynecology this week for reexamination. Make sure you do not take any ibuprofen or naproxen Otrivin or Advil. Please purchase over-the-counter prenatal vitamins and make sure you continue to stay away from tobacco.  Congratulations!  It was a pleasure to take care of you today, and thank you for coming to our emergency department.  If you have any questions or concerns before leaving please ask the nurse to grab me and I'm more than happy to go through your aftercare instructions again.  If you were prescribed any opioid pain medication today such as Norco, Vicodin, Percocet, morphine, hydrocodone, or oxycodone please make sure you do not drive when you are taking this medication as it can alter your ability to drive safely.  If you have any concerns once you are home that you are not improving or are in fact getting worse before you can make it to your follow-up appointment, please do not hesitate to call 911 and come back for further evaluation.  Darel Hong MD  Results for orders placed or performed during the hospital encounter of 07/20/16  Lipase, blood  Result Value Ref Range   Lipase 20 11 - 51 U/L  Comprehensive metabolic panel  Result Value Ref Range   Sodium 133 (L) 135 - 145 mmol/L   Potassium 4.0 3.5 - 5.1 mmol/L   Chloride 105 101 - 111 mmol/L   CO2 20 (L) 22 - 32 mmol/L   Glucose, Bld 115 (H) 65 - 99 mg/dL   BUN 20 6 - 20 mg/dL   Creatinine, Ser 1.71 (H) 0.44 - 1.00 mg/dL   Calcium 8.8 (L) 8.9 - 10.3 mg/dL   Total Protein 6.4 (L) 6.5 - 8.1 g/dL   Albumin 2.5 (L) 3.5 - 5.0 g/dL   AST 25 15 - 41 U/L   ALT 13 (L) 14 - 54 U/L   Alkaline Phosphatase 103 38 - 126 U/L   Total Bilirubin 0.3 0.3 - 1.2 mg/dL   GFR calc non Af Amer 36 (L) >60 mL/min   GFR calc Af Amer 41 (L) >60 mL/min   Anion gap 8 5 - 15  CBC  Result Value Ref Range   WBC 13.6 (H) 3.6 - 11.0 K/uL   RBC 3.53 (L) 3.80 - 5.20 MIL/uL   Hemoglobin 11.3 (L) 12.0  - 16.0 g/dL   HCT 33.1 (L) 35.0 - 47.0 %   MCV 93.7 80.0 - 100.0 fL   MCH 31.8 26.0 - 34.0 pg   MCHC 34.0 32.0 - 36.0 g/dL   RDW 13.0 11.5 - 14.5 %   Platelets 170 150 - 440 K/uL  Lactic acid, plasma  Result Value Ref Range   Lactic Acid, Venous 1.0 0.5 - 1.9 mmol/L   Dg Knee Complete 4 Views Left  Result Date: 07/20/2016 CLINICAL DATA:  Pain. EXAM: LEFT KNEE - COMPLETE 4+ VIEW COMPARISON:  None. FINDINGS: No evidence of fracture, dislocation, or joint effusion. No evidence of arthropathy or other focal bone abnormality. Soft tissues are unremarkable. IMPRESSION: Negative. Electronically Signed   By: Kerby Moors M.D.   On: 07/20/2016 16:52   Ct Angio Abd/pel W/ And/or W/o  Result Date: 07/20/2016 CLINICAL DATA:  Abdominal pain EXAM: CTA ABDOMEN AND PELVIS wITHOUT AND WITH CONTRAST TECHNIQUE: Multidetector CT imaging of the abdomen and pelvis was performed using the standard protocol during bolus administration of intravenous contrast. Multiplanar reconstructed images and MIPs were obtained and reviewed to evaluate the vascular anatomy.  CONTRAST:  75 cc Isovue 370 COMPARISON:  06/25/2014 FINDINGS: VASCULAR Aorta: Non aneurysmal and patent. Atherosclerotic calcifications are noted. 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 SMA: Patent Renals: Single renal arteries are patent. IMA: Patent. Inflow: Right common iliac artery is patent with atherosclerotic calcifications. Mild narrowing in the proximal right external iliac artery. Atherosclerotic calcifications and plaque is seen throughout the external iliac artery. There is at least 50% narrowing in the distal right external iliac artery. Right internal iliac artery is mildly diseased There are atherosclerotic calcifications in the left common iliac artery without significant focal narrowing. There is diffuse disease in the left external iliac artery. There is approximately 50% narrowing in the distal left  external iliac artery. The left internal iliac artery is mildly disease. Proximal Outflow: Femoral arteries are grossly patent within the confines of the examination. Veins: Arterial phase only. Review of the MIP images confirms the above findings. NON-VASCULAR Lower chest: Bibasilar subsegmental atelectasis Hepatobiliary: Diffuse hepatic steatosis. Gallbladder is unremarkable. Pancreas: Unremarkable Spleen: Unremarkable Adrenals/Urinary Tract: There are chronic changes of the kidneys bilaterally. Adrenal glands are within normal limits. Stomach/Bowel: Stomach is unremarkable. Moderate stool burden throughout the colon. No evidence of small-bowel obstruction. Lymphatic: No evidence of retroperitoneal adenopathy. Subcentimeter short axis diameter para-aortic lymph nodes are noted. Reproductive: The uterus is markedly enlarged and contains a fetus. The fetus appears developed and is most likely in the third trimester. Other: No free-fluid. Musculoskeletal: No vertebral compression deformity. IMPRESSION: VASCULAR No evidence of obstruction in the visceral vasculature result in mesenteric ischemia There is significant narrowing in the distal right external iliac artery and 50% narrowing in the distal left external iliac artery. NON-VASCULAR The uterus is enlarged and is gravid with a developed fetus. It is estimated to be a third trimester gestation. Critical Value/emergent results were called by telephone at the time of interpretation on 07/20/2016 at 5:57 pm to Dr. Darel Hong , who verbally acknowledged these results. Electronically Signed   By: Marybelle Killings M.D.   On: 07/20/2016 17:57

## 2016-07-20 NOTE — ED Provider Notes (Signed)
[facility] Emergency Department Provider Note  ____________________________________________   First MD Initiated Contact with Patient 07/20/16 1615     (approximate)  I have reviewed the triage vital signs and the nursing notes.   HISTORY  Chief Complaint Diarrhea and Abdominal Pain    HPI Gabrielle Schultz is a 46 y.o. female who comes to the emergency department with severe cramping diffuse abdominal pain that began this morning when she awoke. She has a past medical history of ischemic colitis although has never had surgery. She also reports aching discomfort in her left knee. She denies trauma. Her symptoms have been slowly progressive. Nothing seems to make them better or worse. Her last menstrual period was nearly a year ago and she believes she is currently menopausal.    06/26/14 Colonoscopy:  Impression:        - One 15 mm polyp in the proximal ascending colon.                     Resected and retrieved. Clip was placed.                    - One 8 mm polyp in the proximal ascending colon. Resected                     and retrieved. Clips were placed.                    - Segmental moderate inflammation was found in the sigmoid                     colon and in the descending colon from 45 cm to 15 cm.                     Rectum spared. Biopsied.                    - The examination was otherwise normal.                    - Multiple biopsies were obtained in the proximal                     transverse colon, at the hepatic flexure and in the                     ascending colon.                    - Four biopsies were obtained in the rectum from normal                     mucosa.                    - Suspect this is ischemic colitis and not Crohn's or                     Ulcerative colitis. Past Medical History:  Diagnosis Date  . Acid reflux   . Acid reflux   . Allergic rhinitis 06/26/2015  . Allergy   . Asthma   . Carpal tunnel syndrome    right hand  . Chronic kidney disease   . Colitis   . CVA (cerebral infarction)    2009, 2015  . History of syncope   . Hypertension 2008  . Low HDL (  under 40) 06/26/2015  . MI (myocardial infarction) ([place])    2015 - patient wasnt told by cardiologist that she had MI but has had cardiac cath in past  . Migraines   . Poor circulation   . Renal insufficiency   . Stroke ([place])   . Syncope and collapse     Patient Active Problem List   Diagnosis Date Noted  . Edema of both feet 04/22/2016  . Abnormal TSH 04/22/2016  . Abdominal pain, right upper quadrant 12/02/2015  . Dermatitis 12/02/2015  . Urinary tract infection 10/28/2015  . Low HDL (under 40) 06/26/2015  . Allergic rhinitis 06/26/2015  . Tobacco abuse 06/26/2015  . Cerebral infarct ([place]) 11/21/2013  . Chronic kidney disease (CKD), stage III (moderate) 11/21/2013  . Hypertension 08/12/2011  . LVH (left ventricular hypertrophy) 08/12/2011  . Chronic renal insufficiency 08/12/2011    Past Surgical History:  Procedure Laterality Date  . CARDIAC CATHETERIZATION    . CESAREAN SECTION  2004  . COLONOSCOPY WITH PROPOFOL N/A 06/26/2014   Procedure: COLONOSCOPY WITH PROPOFOL;  Surgeon: Josefine Class, MD;  Location: [facility] ENDOSCOPY;  Service: Endoscopy;  Laterality: N/A;    Prior to Admission medications   Medication Sig Start Date End Date Taking? Authorizing Provider  amLODipine (NORVASC) 10 MG tablet Take 1 tablet (10 mg total) by mouth daily. 03/11/16 03/11/17  Zara Council A, PA-C  aspirin 81 MG tablet Take 81 mg by mouth daily.    [provider]  beclomethasone (QVAR) 40 MCG/ACT inhaler Inhale 1 puff into the lungs 2 (two) times daily. Patient not taking: Reported on 12/02/2015 10/09/15   Zara Council A, PA-C  cetirizine (ZYRTEC) 10 MG tablet Take 1 tablet (10 mg total) by mouth daily. 03/02/16   Tawni Millers, MD  cloNIDine (CATAPRES) 0.1 MG tablet 2 TABLETS BY MOUTH TWICE A DAY 09/18/15   McGowan, Larene Beach  A, PA-C  cloNIDine (CATAPRES) 0.3 MG tablet TAKE ONE TABLET BY MOUTH 2 TIMES A DAY 06/03/16   McGowan, Larene Beach A, PA-C  cloNIDine (CATAPRES) 0.3 MG tablet TAKE ONE TABLET BY MOUTH 2 TIMES A DAY 06/21/16   Tawni Millers, MD  fluticasone (FLONASE) 50 MCG/ACT nasal spray Place 1 spray into both nostrils 2 (two) times daily. 06/26/15   Arnetha Courser, MD  hydrochlorothiazide (MICROZIDE) 12.5 MG capsule Take 1 capsule (12.5 mg total) by mouth daily. 06/03/16   Zara Council A, PA-C  loperamide (IMODIUM) 2 MG capsule Take 2 mg by mouth as needed for diarrhea or loose stools (about once a week).    [provider]  mometasone (ELOCON) 0.1 % cream Apply 1 application topically daily. 12/04/15   Doles-Johnson, Teah, NP  omeprazole (PRILOSEC) 20 MG capsule Take 1 capsule (20 mg total) by mouth daily. 03/11/16   McGowan, Larene Beach A, PA-C  ranitidine (ZANTAC) 150 MG tablet TAKE ONE TABLET BY MOUTH 2 TIMES A DAY 06/21/16   Tawni Millers, MD  simvastatin (ZOCOR) 20 MG tablet Take 1 tablet (20 mg total) by mouth at bedtime. 03/11/16   Zara Council A, PA-C  sodium bicarbonate 650 MG tablet Take 650 mg by mouth. 08/04/15 08/03/16  [provider]  VENTOLIN HFA 108 (90 Base) MCG/ACT inhaler INHALE 2 PUFFS EVERY FOUR HOURS AS NEEDED FOR COUGH AND WHEEZING. 12/16/15   Ernestine Conrad, Larene Beach A, PA-C    Allergies Coconut oil; Amoxicillin; and Penicillins  Family History  Problem Relation Age of Onset  . Hyperlipidemia Mother   . Heart disease  Mother   . COPD Mother   . Hypertension Father   . Heart disease Father   . Hyperlipidemia Father   . Hypertension Sister   . Kidney disease Sister   . Hypertension Brother   . Heart disease Brother     Social History Social History  Substance Use Topics  . Smoking status: Current Every Day Smoker    Packs/day: 0.50    Years: 18.00    Types: Cigarettes  . Smokeless tobacco: Never Used  . Alcohol use Yes     Comment: "seldom" - drinks 350-785mL when  she does drink    Review of Systems Constitutional: No fever/chills Eyes: No visual changes. ENT: No sore throat. Cardiovascular: Denies chest pain. Respiratory: Denies shortness of breath. Gastrointestinal: Positive abdominal pain.  Positive nausea, no vomiting.  No diarrhea.  No constipation. Genitourinary: Negative for dysuria. Musculoskeletal: Positive for left knee pain Skin: Negative for rash. Neurological: Negative for headaches, focal weakness or numbness.   ____________________________________________   PHYSICAL EXAM:  VITAL SIGNS: ED Triage Vitals [07/20/16 1415]  Enc Vitals Group     BP (!) 175/58     Pulse Rate 77     Resp 20     Temp 98.5 F (36.9 C)     Temp Source Oral     SpO2 100 %     Weight 199 lb (90.3 kg)     Height 5\' 1"  (1.549 m)     Head Circumference      Peak Flow      Pain Score 10     Pain Loc      Pain Edu?      Excl. in [place]?     Constitutional: Alert and oriented 4 appears quite uncomfortable nontoxic no diaphoresis speaks in full clear sentences Eyes: PERRL EOMI. Head: Atraumatic. Nose: No congestion/rhinnorhea. Mouth/Throat: No trismus Neck: No stridor.   Cardiovascular: Normal rate, regular rhythm. Grossly normal heart sounds.  Good peripheral circulation. Respiratory: Normal respiratory effort.  No retractions. Lungs CTAB and moving good air Gastrointestinal: Obese abdomen mild diffuse tenderness with no focality no rebound or guarding and no frank peritonitis Musculoskeletal: Left knee extensor mechanism intact the grossly stable no erythema warmth or focal tenderness Neurologic:  Normal speech and language. No gross focal neurologic deficits are appreciated. Skin:  Skin is warm, dry and intact. No rash noted. Psychiatric: Mood and affect are normal. Speech and behavior are normal.    ____________________________________________   DIFFERENTIAL includes but not limited to  Ischemic colitis, infectious colitis, small bowel  obstruction, large bowel obstruction, gastroenteritis, pyelonephritis, renal colic ____________________________________________   LABS (all labs ordered are listed, but only abnormal results are displayed)  Labs Reviewed  COMPREHENSIVE METABOLIC PANEL - Abnormal; Notable for the following:       Result Value   Sodium 133 (*)    CO2 20 (*)    Glucose, Bld 115 (*)    Creatinine, Ser 1.71 (*)    Calcium 8.8 (*)    Total Protein 6.4 (*)    Albumin 2.5 (*)    ALT 13 (*)    GFR calc non Af Amer 36 (*)    GFR calc Af Amer 41 (*)    All other components within normal limits  CBC - Abnormal; Notable for the following:    WBC 13.6 (*)    RBC 3.53 (*)    Hemoglobin 11.3 (*)    HCT 33.1 (*)    All other components within normal  limits  LIPASE, BLOOD  LACTIC ACID, PLASMA    Kidney function is at her baseline slight elevation in her white count is nonspecific __________________________________________  EKG  ED ECG REPORT I, Darel Hong, the attending physician, personally viewed and interpreted this ECG.  Date: 07/20/2016 Rate: 65 Rhythm: normal sinus rhythm QRS Axis: normal Intervals: normal ST/T Wave abnormalities: normal Narrative Interpretation: Left ventricular hypertrophy with normal repolarization abnormalities abnormal  ____________________________________________  RADIOLOGY  CT scan shows widely patent vessels with no signs of colitis but does show a third trimester pregnancy ____________________________________________   PROCEDURES  Procedure(s) performed: no  Procedures  Critical Care performed: no  Observation: no ____________________________________________   INITIAL IMPRESSION / ASSESSMENT AND PLAN / ED COURSE  Pertinent labs & imaging results that were available during my care of the patient were reviewed by me and considered in my medical decision making (see chart for details).  The patient arrives extremely uncomfortable appearing with  diarrhea and abdominal pain that feels identical to her previous episodes of colitis. On chart review her colitis was found to be not infectious and was truly ischemic which is concerning. I appreciate her chronic kidney disease but at this point it is imperative to evaluate her large vessels into the nose she had an ischemic event now.     ----------------------------------------- 6:26 PM on 07/20/2016 -----------------------------------------  This is the patient's fourth pregnancy she had 2 miscarriages. One determined 24 years ago. All of her care has been at [facility] and she chooses to continue her care there. I called [facility] and spoke with the nurse after hours who said that she was unable to make an appointment but would notify the physicians that the patient would require follow-up. Fortunately [facility] is also on call today. ____________________________________________   FINAL CLINICAL IMPRESSION(S) / ED DIAGNOSES  Final diagnoses:  Pain  Pregnant and not yet delivered in third trimester      NEW MEDICATIONS STARTED DURING THIS VISIT:  Discharge Medication List as of 07/20/2016  6:25 PM       Note:  This document was prepared using Dragon voice recognition software and may include unintentional dictation errors.     Darel Hong, MD 07/20/16 2212

## 2016-07-22 ENCOUNTER — Other Ambulatory Visit (INDEPENDENT_AMBULATORY_CARE_PROVIDER_SITE_OTHER): Payer: Self-pay

## 2016-07-22 ENCOUNTER — Ambulatory Visit (INDEPENDENT_AMBULATORY_CARE_PROVIDER_SITE_OTHER): Payer: Self-pay | Admitting: Certified Nurse Midwife

## 2016-07-22 ENCOUNTER — Encounter: Payer: Self-pay | Admitting: Certified Nurse Midwife

## 2016-07-22 VITALS — BP 142/68 | HR 65 | Ht 61.0 in | Wt 201.0 lb

## 2016-07-22 DIAGNOSIS — Z3A29 29 weeks gestation of pregnancy: Secondary | ICD-10-CM

## 2016-07-22 DIAGNOSIS — Z362 Encounter for other antenatal screening follow-up: Secondary | ICD-10-CM

## 2016-07-22 DIAGNOSIS — O0990 Supervision of high risk pregnancy, unspecified, unspecified trimester: Secondary | ICD-10-CM

## 2016-07-22 DIAGNOSIS — Z789 Other specified health status: Secondary | ICD-10-CM

## 2016-07-22 NOTE — Addendum Note (Signed)
Addended by: Dalia Heading on: 07/22/2016 05:40 PM   Modules accepted: Orders

## 2016-07-22 NOTE — Progress Notes (Signed)
Obstetrics & Gynecology Office Visit   Chief Complaint:  Chief Complaint  Patient presents with  . ER Follow UP    History of Present Illness:46 year old G5 P33 WF presents for an ER follow up after finding out that she is pregnant. She had a CT scan yesterday for abdominal pain and and a fetus was seen that was large enough to be in the second or third trimester.. She reports a long history of irregular menses and has not had a menses since ?January of 2018. She thought she was going into menopause. She has not been seen for gyn care since the 1990s. She has one child born [birth year]26[birth year]1992 via Cesarean section for fetal intolerance/FTP at 41 weeks. She had one SAB prior to 1994 and 2 SABs since 1994-both requiring D&Cs. She reports that she is RH negative. She is extremely high risk due to histories of strokes in 2009 and 2014, an MI, Stage3 kidney disease, hypertension, ischemic colitis and asthma. She smokes 1/4 PPD cigarettes. Her creatinine yesterday in the ER was 1.71 and BUN 20. GFR was 52ml/hr. Hemoglobin A1C in March was 5.3% She has gained about 22 # since February (weight 179#). She has no insurance. Current medications include:  Current Outpatient Prescriptions:  .  amLODipine (NORVASC) 10 MG tablet, Take 1 tablet (10 mg total) by mouth daily., Disp: 90 tablet, Rfl: 2 .  aspirin 81 MG tablet, Take 81 mg by mouth daily., Disp: , Rfl:  .  cetirizine (ZYRTEC) 10 MG tablet, Take 1 tablet (10 mg total) by mouth daily., Disp: 30 tablet, Rfl: 5 .  cloNIDine (CATAPRES) 0.3 MG tablet, TAKE ONE TABLET BY MOUTH 2 TIMES A DAY, Disp: 180 tablet, Rfl: 0 .  fluticasone (FLONASE) 50 MCG/ACT nasal spray, Place 1 spray into both nostrils 2 (two) times daily., Disp: 16 g, Rfl: 11 .  hydrochlorothiazide (MICROZIDE) 12.5 MG capsule, Take 1 capsule (12.5 mg total) by mouth daily., Disp: 30 capsule, Rfl: 2 .  loperamide (IMODIUM) 2 MG capsule, Take 2 mg by mouth as needed for diarrhea or loose stools (about  once a week)., Disp: , Rfl:  .  mometasone (ELOCON) 0.1 % cream, Apply 1 application topically daily., Disp: 45 g, Rfl: 0 .  omeprazole (PRILOSEC) 20 MG capsule, Take 1 capsule (20 mg total) by mouth daily., Disp: 90 capsule, Rfl: 0 .  ranitidine (ZANTAC) 150 MG tablet, TAKE ONE TABLET BY MOUTH 2 TIMES A DAY, Disp: 180 tablet, Rfl: 0 .  VENTOLIN HFA 108 (90 Base) MCG/ACT inhaler, INHALE 2 PUFFS EVERY FOUR HOURS AS NEEDED FOR COUGH AND WHEEZING., Disp: 54 g, Rfl: 0  Current Facility-Administered Medications:  .  beclomethasone (QVAR) 40 MCG/ACT inhaler 1 puff, 1 puff, Inhalation, BID, McGowan, Shannon A, PA-C   She states that she has stopped taking the HCTZ. Review of Systems:  Review of Systems  Constitutional: Negative for chills and fever.  HENT: Negative for sore throat.   Respiratory: Negative for cough and shortness of breath.   Cardiovascular: Negative for chest pain.  Gastrointestinal: Positive for abdominal pain and nausea.  Genitourinary:       No bleeding or spotting since the beginning of the year  Endo/Heme/Allergies: Positive for environmental allergies.     Past Medical History:  Past Medical History:  Diagnosis Date  . Acid reflux   . Allergic rhinitis 06/26/2015  . Allergy   . Asthma   . Carpal tunnel syndrome    right hand  . Chronic  kidney disease   . Colitis   . CVA (cerebral infarction)    2009, 2015  . History of syncope   . Hypertension 2008  . Low HDL (under 40) 06/26/2015  . MI (myocardial infarction) ([place])    2015 - patient wasnt told by cardiologist that she had MI but has had cardiac cath in past  . Migraines   . Poor circulation   . Renal insufficiency   . Stroke ([place])   . Syncope and collapse     Past Surgical History:  Past Surgical History:  Procedure Laterality Date  . CARDIAC CATHETERIZATION    . CESAREAN SECTION  1994   Dr. Amm[facility]  . COLONOSCOPY WITH PROPOFOL N/A 06/26/2014   Procedure: COLONOSCOPY WITH PROPOFOL;  Surgeon:  Josefine Class, MD;  Location: [facility] ENDOSCOPY;  Service: Endoscopy;  Laterality: N/A;  . DILATION AND CURETTAGE OF UTERUS  x2 for incomplete SABs    Gynecologic History:  Obstetric History: [id number]  Family History:  Family History  Problem Relation Age of Onset  . Hyperlipidemia Mother   . Heart disease Mother        has a defibrillator  . COPD Mother   . Hypertension Father   . Heart disease Father   . Hyperlipidemia Father   . Hypertension Sister        died in her 63s from heart failure and end stage kidney disease  . Kidney disease Sister   . Hypertension Brother   . Heart disease Brother        has a defibrillator  . Coronary artery disease Brother        had CABG    Social History:  Social History   Social History  . Marital status: Married    Spouse name: N/A  . Number of children: 1  . Years of education: N/A   Occupational History  . Not on file.   Social History Main Topics  . Smoking status: Current Every Day Smoker    Packs/day: 0.25    Years: 18.00    Types: Cigarettes  . Smokeless tobacco: Never Used  . Alcohol use Yes     Comment: "seldom" - drinks 350-723mL when she does drink  . Drug use: No  . Sexual activity: Yes    Partners: Male    Birth control/ protection: None   Other Topics Concern  . Not on file   Social History Narrative  . No narrative on file    Allergies:  Allergies  Allergen Reactions  . Coconut Oil Swelling  . Amoxicillin Rash  . Penicillins Rash    Medications:Physical Exam Vitals: BP (!) 142/68   Pulse 65   Ht 5\' 1"  (1.549 m)   Wt 91.2 kg (201 lb)   LMP 01/19/2016 (Approximate) Comment: unsure LMP  BMI 37.98 kg/m  Physical Exam  Constitutional: She is oriented to person, place, and time.  Obese WF in NAD  Neurological: She is alert and oriented to person, place, and time.  Skin: Skin is warm and dry.  Psychiatric: She has a normal mood and affect.   Ultrasound performed for dating and CGA was 29  weeks giving her an EDC=10/07/2016. Placenta is fundal. FHTs 144BPM. Fetal bladder, stomach and kidneys seen  Assessment: 46 y.o. [id number] high risk pregnancy at [redacted] weeks gestation, EDC=10/07/2016 Stage 3 renal disease Hypertension History of stroke x 2 History of MI Ischemic colitis Tobacco use Advanced maternal age>40 yo Obesity (BMI=37.98 kg/m2)  Plan: 1) Explained  high risk status and need to refer to maternal fetal medicine specialists for her care-patient desires referral to [facility].  2) Advised to discontinue simvastatin, but to continue other medication until seen at [facility]  3) Unable to pay for the full NOB panel at this time. Will get ABO/RH and antibody screen and if it confirms patient is RH negative-will have her return for Rhogam  4) Discussed diet in pregnancy and given handout on foods to avoid, safe meds in pregnancy etc.   Dalia Heading, CNM

## 2016-07-27 ENCOUNTER — Telehealth: Payer: Self-pay | Admitting: Certified Nurse Midwife

## 2016-07-27 NOTE — Telephone Encounter (Signed)
Spoke to pt to let her know that referral was sent on Friday and followed up today to check on status of appt. Pt should receive call from [facility] today or tomorrow for appt. Pt has also recently applied for Medicaid and has not any lab work done at this time. Need to find out Rh status as she is due for rhogham.

## 2016-07-27 NOTE — Telephone Encounter (Signed)
Pt is calling wanting to know when she was to be schedule to follow up with [facility]. Please advise

## 2016-07-29 ENCOUNTER — Ambulatory Visit: Payer: Self-pay

## 2016-07-30 DIAGNOSIS — O10919 Unspecified pre-existing hypertension complicating pregnancy, unspecified trimester: Secondary | ICD-10-CM | POA: Insufficient documentation

## 2016-07-30 DIAGNOSIS — O99519 Diseases of the respiratory system complicating pregnancy, unspecified trimester: Secondary | ICD-10-CM | POA: Insufficient documentation

## 2016-07-30 DIAGNOSIS — Z8679 Personal history of other diseases of the circulatory system: Secondary | ICD-10-CM | POA: Insufficient documentation

## 2016-07-30 DIAGNOSIS — O09523 Supervision of elderly multigravida, third trimester: Secondary | ICD-10-CM | POA: Insufficient documentation

## 2016-07-30 DIAGNOSIS — O365921 Maternal care for other known or suspected poor fetal growth, second trimester, fetus 1: Secondary | ICD-10-CM | POA: Insufficient documentation

## 2016-07-30 DIAGNOSIS — Z8673 Personal history of transient ischemic attack (TIA), and cerebral infarction without residual deficits: Secondary | ICD-10-CM | POA: Insufficient documentation

## 2016-07-30 DIAGNOSIS — J45909 Unspecified asthma, uncomplicated: Secondary | ICD-10-CM | POA: Insufficient documentation

## 2016-07-30 DIAGNOSIS — O0993 Supervision of high risk pregnancy, unspecified, third trimester: Secondary | ICD-10-CM | POA: Insufficient documentation

## 2016-07-30 DIAGNOSIS — Z98891 History of uterine scar from previous surgery: Secondary | ICD-10-CM | POA: Insufficient documentation

## 2016-07-30 NOTE — Telephone Encounter (Signed)
Pt has appt 07/30/16 at [facility].

## 2016-07-31 DIAGNOSIS — I161 Hypertensive emergency: Secondary | ICD-10-CM | POA: Insufficient documentation

## 2016-08-03 ENCOUNTER — Encounter: Payer: Self-pay | Admitting: Certified Nurse Midwife

## 2016-08-03 ENCOUNTER — Ambulatory Visit: Payer: Self-pay | Admitting: Certified Nurse Midwife

## 2016-08-26 ENCOUNTER — Encounter: Payer: Self-pay | Admitting: Pharmacist

## 2016-08-26 ENCOUNTER — Other Ambulatory Visit: Payer: Self-pay

## 2016-08-26 ENCOUNTER — Other Ambulatory Visit: Payer: Self-pay | Admitting: Internal Medicine

## 2016-08-26 MED ORDER — MOMETASONE FUROATE 110 MCG/INH IN AEPB: 2.0000 | INHALATION_SPRAY | Freq: Two times a day (BID) | RESPIRATORY_TRACT | 3 refills | Status: DC

## 2016-08-26 NOTE — Telephone Encounter (Signed)
Patient walked in requesting refill on mometasone.  Was prescribed inhaler at [facility] during recent visit.  Needs refill, though needs to be written for by [facility] in order for her to get through [facility].

## 2016-08-28 ENCOUNTER — Emergency Department: Payer: Medicaid Other

## 2016-08-28 ENCOUNTER — Encounter: Payer: Self-pay | Admitting: *Deleted

## 2016-08-28 ENCOUNTER — Inpatient Hospital Stay
Admission: EM | Admit: 2016-08-28 | Discharge: 2016-08-30 | DRG: 776 | Disposition: A | Discharge status: Home / Self Care | Payer: Medicaid Other | Attending: Internal Medicine | Admitting: Internal Medicine

## 2016-08-28 DIAGNOSIS — O9963 Diseases of the digestive system complicating the puerperium: Secondary | ICD-10-CM | POA: Diagnosis present

## 2016-08-28 DIAGNOSIS — I161 Hypertensive emergency: Secondary | ICD-10-CM | POA: Diagnosis present

## 2016-08-28 DIAGNOSIS — R7989 Other specified abnormal findings of blood chemistry: Secondary | ICD-10-CM

## 2016-08-28 DIAGNOSIS — J4520 Mild intermittent asthma, uncomplicated: Secondary | ICD-10-CM | POA: Diagnosis present

## 2016-08-28 DIAGNOSIS — O9943 Diseases of the circulatory system complicating the puerperium: Secondary | ICD-10-CM | POA: Diagnosis present

## 2016-08-28 DIAGNOSIS — I739 Peripheral vascular disease, unspecified: Secondary | ICD-10-CM | POA: Diagnosis present

## 2016-08-28 DIAGNOSIS — F17210 Nicotine dependence, cigarettes, uncomplicated: Secondary | ICD-10-CM | POA: Diagnosis present

## 2016-08-28 DIAGNOSIS — R778 Other specified abnormalities of plasma proteins: Secondary | ICD-10-CM

## 2016-08-28 DIAGNOSIS — R079 Chest pain, unspecified: Secondary | ICD-10-CM | POA: Diagnosis present

## 2016-08-28 DIAGNOSIS — Z8673 Personal history of transient ischemic attack (TIA), and cerebral infarction without residual deficits: Secondary | ICD-10-CM | POA: Diagnosis not present

## 2016-08-28 DIAGNOSIS — O99285 Endocrine, nutritional and metabolic diseases complicating the puerperium: Secondary | ICD-10-CM | POA: Diagnosis present

## 2016-08-28 DIAGNOSIS — Z79899 Other long term (current) drug therapy: Secondary | ICD-10-CM | POA: Diagnosis not present

## 2016-08-28 DIAGNOSIS — O99335 Smoking (tobacco) complicating the puerperium: Secondary | ICD-10-CM | POA: Diagnosis present

## 2016-08-28 DIAGNOSIS — N179 Acute kidney failure, unspecified: Secondary | ICD-10-CM | POA: Diagnosis present

## 2016-08-28 DIAGNOSIS — I129 Hypertensive chronic kidney disease with stage 1 through stage 4 chronic kidney disease, or unspecified chronic kidney disease: Secondary | ICD-10-CM | POA: Diagnosis present

## 2016-08-28 DIAGNOSIS — Z881 Allergy status to other antibiotic agents status: Secondary | ICD-10-CM

## 2016-08-28 DIAGNOSIS — Z880 Allergy status to penicillin: Secondary | ICD-10-CM

## 2016-08-28 DIAGNOSIS — K219 Gastro-esophageal reflux disease without esophagitis: Secondary | ICD-10-CM | POA: Diagnosis present

## 2016-08-28 DIAGNOSIS — O1023 Pre-existing hypertensive chronic kidney disease complicating the puerperium: Secondary | ICD-10-CM | POA: Diagnosis present

## 2016-08-28 DIAGNOSIS — I252 Old myocardial infarction: Secondary | ICD-10-CM

## 2016-08-28 DIAGNOSIS — J4521 Mild intermittent asthma with (acute) exacerbation: Secondary | ICD-10-CM

## 2016-08-28 DIAGNOSIS — N183 Chronic kidney disease, stage 3 (moderate): Secondary | ICD-10-CM | POA: Diagnosis present

## 2016-08-28 DIAGNOSIS — O9953 Diseases of the respiratory system complicating the puerperium: Secondary | ICD-10-CM | POA: Diagnosis present

## 2016-08-28 DIAGNOSIS — O904 Postpartum acute kidney failure: Secondary | ICD-10-CM | POA: Diagnosis present

## 2016-08-28 DIAGNOSIS — E782 Mixed hyperlipidemia: Secondary | ICD-10-CM | POA: Diagnosis present

## 2016-08-28 DIAGNOSIS — I10 Essential (primary) hypertension: Secondary | ICD-10-CM

## 2016-08-28 DIAGNOSIS — Z7982 Long term (current) use of aspirin: Secondary | ICD-10-CM | POA: Diagnosis not present

## 2016-08-28 DIAGNOSIS — I351 Nonrheumatic aortic (valve) insufficiency: Secondary | ICD-10-CM | POA: Diagnosis present

## 2016-08-28 LAB — CBC
HCT: 28.7 % — ABNORMAL LOW (ref 35.0–47.0)
Hemoglobin: 9.6 g/dL — ABNORMAL LOW (ref 12.0–16.0)
MCH: 31.9 pg (ref 26.0–34.0)
MCHC: 33.4 g/dL (ref 32.0–36.0)
MCV: 95.5 fL (ref 80.0–100.0)
PLATELETS: 137 10*3/uL — AB (ref 150–440)
RBC: 3.01 MIL/uL — AB (ref 3.80–5.20)
RDW: 13.5 % (ref 11.5–14.5)
WBC: 7.6 10*3/uL (ref 3.6–11.0)

## 2016-08-28 LAB — COMPREHENSIVE METABOLIC PANEL
ALT: 12 U/L — ABNORMAL LOW (ref 14–54)
AST: 13 U/L — ABNORMAL LOW (ref 15–41)
Albumin: 2.9 g/dL — ABNORMAL LOW (ref 3.5–5.0)
Alkaline Phosphatase: 75 U/L (ref 38–126)
Anion gap: 6 (ref 5–15)
BUN: 23 mg/dL — ABNORMAL HIGH (ref 6–20)
CHLORIDE: 112 mmol/L — AB (ref 101–111)
CO2: 20 mmol/L — ABNORMAL LOW (ref 22–32)
Calcium: 8.3 mg/dL — ABNORMAL LOW (ref 8.9–10.3)
Creatinine, Ser: 2.45 mg/dL — ABNORMAL HIGH (ref 0.44–1.00)
GFR, EST AFRICAN AMERICAN: 27 mL/min — AB (ref >60)
GFR, EST NON AFRICAN AMERICAN: 23 mL/min — AB (ref >60)
Glucose, Bld: 94 mg/dL (ref 65–99)
POTASSIUM: 3.4 mmol/L — AB (ref 3.5–5.1)
Sodium: 138 mmol/L (ref 135–145)
TOTAL PROTEIN: 5.8 g/dL — AB (ref 6.5–8.1)
Total Bilirubin: 0.3 mg/dL (ref 0.3–1.2)

## 2016-08-28 LAB — TROPONIN I: TROPONIN I: 0.25 ng/mL — AB (ref <0.03)

## 2016-08-28 MED ORDER — ONDANSETRON HCL 4 MG/2ML IJ SOLN: 4.0000 mg | Freq: Four times a day (QID) | INTRAMUSCULAR | Status: DC | PRN

## 2016-08-28 MED ORDER — FLUTICASONE PROPIONATE 50 MCG/ACT NA SUSP
1.0000 | Freq: Two times a day (BID) | NASAL | Status: DC
  Administered 2016-08-29 – 2016-08-30 (×2): 1 via NASAL
  Filled 2016-08-28: qty 16

## 2016-08-28 MED ORDER — ACETAMINOPHEN 650 MG RE SUPP: 650.0000 mg | Freq: Four times a day (QID) | RECTAL | Status: DC | PRN

## 2016-08-28 MED ORDER — CARVEDILOL 12.5 MG PO TABS
12.5000 mg | ORAL_TABLET | Freq: Two times a day (BID) | ORAL | Status: DC
  Filled 2016-08-28: qty 1

## 2016-08-28 MED ORDER — ASPIRIN 81 MG PO TABS: 81.0000 mg | ORAL_TABLET | Freq: Every day | ORAL | Status: DC

## 2016-08-28 MED ORDER — BUDESONIDE 0.25 MG/2ML IN SUSP
0.2500 mg | Freq: Two times a day (BID) | RESPIRATORY_TRACT | Status: DC
  Administered 2016-08-29 – 2016-08-30 (×3): 0.25 mg via RESPIRATORY_TRACT
  Filled 2016-08-28 (×3): qty 2

## 2016-08-28 MED ORDER — CLONIDINE HCL 0.1 MG PO TABS
0.3000 mg | ORAL_TABLET | Freq: Three times a day (TID) | ORAL | Status: DC
  Administered 2016-08-29 – 2016-08-30 (×4): 0.3 mg via ORAL
  Filled 2016-08-28 (×4): qty 3

## 2016-08-28 MED ORDER — SODIUM CHLORIDE 0.9 % IV SOLN
INTRAVENOUS | Status: DC
  Administered 2016-08-28 – 2016-08-29 (×2): via INTRAVENOUS

## 2016-08-28 MED ORDER — PANTOPRAZOLE SODIUM 40 MG PO TBEC
40.0000 mg | DELAYED_RELEASE_TABLET | Freq: Every day | ORAL | Status: DC
  Administered 2016-08-29 – 2016-08-30 (×2): 40 mg via ORAL
  Filled 2016-08-28 (×2): qty 1

## 2016-08-28 MED ORDER — HEPARIN SODIUM (PORCINE) 5000 UNIT/ML IJ SOLN
5000.0000 [IU] | Freq: Three times a day (TID) | INTRAMUSCULAR | Status: DC
  Administered 2016-08-29 – 2016-08-30 (×4): 5000 [IU] via SUBCUTANEOUS
  Filled 2016-08-28 (×5): qty 1

## 2016-08-28 MED ORDER — ASPIRIN 81 MG PO CHEW
324.0000 mg | CHEWABLE_TABLET | Freq: Once | ORAL | Status: AC
  Administered 2016-08-28: 324 mg via ORAL
  Filled 2016-08-28: qty 4

## 2016-08-28 MED ORDER — HYDRALAZINE HCL 20 MG/ML IJ SOLN: 10.0000 mg | Freq: Four times a day (QID) | INTRAMUSCULAR | Status: DC | PRN

## 2016-08-28 MED ORDER — CLONIDINE HCL 0.1 MG PO TABS
0.2000 mg | ORAL_TABLET | Freq: Once | ORAL | Status: AC
  Administered 2016-08-28: 0.2 mg via ORAL
  Filled 2016-08-28: qty 2

## 2016-08-28 MED ORDER — AMLODIPINE BESYLATE 10 MG PO TABS
10.0000 mg | ORAL_TABLET | Freq: Every day | ORAL | Status: DC
  Administered 2016-08-29 – 2016-08-30 (×2): 10 mg via ORAL
  Filled 2016-08-28 (×2): qty 1

## 2016-08-28 MED ORDER — FAMOTIDINE 20 MG PO TABS
10.0000 mg | ORAL_TABLET | Freq: Every day | ORAL | Status: DC
  Administered 2016-08-29 – 2016-08-30 (×2): 10 mg via ORAL
  Filled 2016-08-28 (×2): qty 1

## 2016-08-28 MED ORDER — HYDRALAZINE HCL 20 MG/ML IJ SOLN: 2.0000 mg | Freq: Once | INTRAMUSCULAR | Status: DC

## 2016-08-28 MED ORDER — ASPIRIN EC 325 MG PO TBEC
325.0000 mg | DELAYED_RELEASE_TABLET | Freq: Every day | ORAL | Status: DC
  Administered 2016-08-29 – 2016-08-30 (×2): 325 mg via ORAL
  Filled 2016-08-28 (×2): qty 1

## 2016-08-28 MED ORDER — BECLOMETHASONE DIPROPIONATE 40 MCG/ACT IN AERS: 1.0000 | INHALATION_SPRAY | Freq: Two times a day (BID) | RESPIRATORY_TRACT | Status: DC

## 2016-08-28 MED ORDER — MOMETASONE FUROATE 110 MCG/INH IN AEPB: 2.0000 | INHALATION_SPRAY | Freq: Two times a day (BID) | RESPIRATORY_TRACT | Status: DC

## 2016-08-28 MED ORDER — ACETAMINOPHEN 325 MG PO TABS: 650.0000 mg | ORAL_TABLET | Freq: Four times a day (QID) | ORAL | Status: DC | PRN

## 2016-08-28 MED ORDER — SODIUM CHLORIDE 0.9 % IV SOLN
Freq: Once | INTRAVENOUS | Status: AC
  Administered 2016-08-28: 21:00:00 via INTRAVENOUS

## 2016-08-28 MED ORDER — LOPERAMIDE HCL 2 MG PO CAPS: 2.0000 mg | ORAL_CAPSULE | ORAL | Status: DC | PRN

## 2016-08-28 MED ORDER — METOPROLOL TARTRATE 5 MG/5ML IV SOLN: 5.0000 mg | INTRAVENOUS | Status: DC | PRN

## 2016-08-28 MED ORDER — LORATADINE 10 MG PO TABS
10.0000 mg | ORAL_TABLET | Freq: Every day | ORAL | Status: DC
  Administered 2016-08-29 – 2016-08-30 (×2): 10 mg via ORAL
  Filled 2016-08-28 (×2): qty 1

## 2016-08-28 MED ORDER — ALBUTEROL SULFATE (2.5 MG/3ML) 0.083% IN NEBU: 3.0000 mL | INHALATION_SOLUTION | RESPIRATORY_TRACT | Status: DC | PRN

## 2016-08-28 MED ORDER — NITROGLYCERIN 2 % TD OINT
0.5000 [in_us] | TOPICAL_OINTMENT | Freq: Four times a day (QID) | TRANSDERMAL | Status: DC
Start: 2016-08-29 — End: 2016-08-29
  Filled 2016-08-28: qty 1

## 2016-08-28 MED ORDER — ONDANSETRON HCL 4 MG PO TABS
4.0000 mg | ORAL_TABLET | Freq: Four times a day (QID) | ORAL | Status: DC | PRN
Start: 2016-08-28 — End: 2016-08-30

## 2016-08-28 MED ORDER — HYDRALAZINE HCL 20 MG/ML IJ SOLN: 10.0000 mg | Freq: Once | INTRAMUSCULAR | Status: DC

## 2016-08-28 MED ORDER — HYDRALAZINE HCL 50 MG PO TABS
100.0000 mg | ORAL_TABLET | Freq: Three times a day (TID) | ORAL | Status: DC
  Filled 2016-08-28: qty 2

## 2016-08-28 NOTE — ED Provider Notes (Signed)
[facility] Emergency Department Provider Note   ____________________________________________   I have reviewed the triage vital signs and the nursing notes.   HISTORY  Chief Complaint Chest Pain   History limited by: Not Limited   HPI Gabrielle Schultz is a 46 y.o. female who presents to the emergency department today because of concerns for chest pain. She states that it has been intermittent. It started last night. It happened again this afternoon. It is located in a band across her lower chest.She stated as sharp. She also states that her blood pressure is been high. She had a C-section 3 weeks ago and state that they switched her blood pressure medication at that time. It sounds like the patient has not been taking the new blood pressure medication as prescribed.   Past Medical History:  Diagnosis Date  . Acid reflux   . Allergic rhinitis 06/26/2015  . Allergy   . Asthma   . Carpal tunnel syndrome    right hand  . Chronic kidney disease   . Colitis   . CVA (cerebral infarction)    2009, 2015  . History of syncope   . Hypertension 2008  . Low HDL (under 40) 06/26/2015  . MI (myocardial infarction) ([place])    2015 - patient wasnt told by cardiologist that she had MI but has had cardiac cath in past  . Migraines   . Poor circulation   . Renal insufficiency   . Stroke ([place])   . Syncope and collapse     Patient Active Problem List   Diagnosis Date Noted  . Edema of both feet 04/22/2016  . Abnormal TSH 04/22/2016  . Abdominal pain, right upper quadrant 12/02/2015  . Dermatitis 12/02/2015  . Urinary tract infection 10/28/2015  . Low HDL (under 40) 06/26/2015  . Allergic rhinitis 06/26/2015  . Tobacco abuse 06/26/2015  . Cerebral infarct ([place]) 11/21/2013  . Chronic kidney disease (CKD), stage III (moderate) 11/21/2013  . Hypertension 08/12/2011  . LVH (left ventricular hypertrophy) 08/12/2011  . Chronic renal insufficiency 08/12/2011    Past  Surgical History:  Procedure Laterality Date  . CARDIAC CATHETERIZATION    . CESAREAN SECTION  1994   Dr. Ammie Dalton  . COLONOSCOPY WITH PROPOFOL N/A 06/26/2014   Procedure: COLONOSCOPY WITH PROPOFOL;  Surgeon: Josefine Class, MD;  Location: [facility] ENDOSCOPY;  Service: Endoscopy;  Laterality: N/A;  . DILATION AND CURETTAGE OF UTERUS  x2 for incomplete SABs    Prior to Admission medications   Medication Sig Start Date End Date Taking? Authorizing Provider  amLODipine (NORVASC) 10 MG tablet Take 1 tablet (10 mg total) by mouth daily. 03/11/16 03/11/17  Zara Council A, PA-C  aspirin 81 MG tablet Take 81 mg by mouth daily.    [provider]  carvedilol (COREG) 12.5 MG tablet Take 12.5 mg by mouth 2 (two) times daily. 08/06/16 10/05/16  Charlotte Crumb, NP  cetirizine (ZYRTEC) 10 MG tablet Take 1 tablet (10 mg total) by mouth daily. 03/02/16   Tawni Millers, MD  cloNIDine (CATAPRES) 0.3 MG tablet TAKE ONE TABLET BY MOUTH 2 TIMES A DAY 06/21/16   Tawni Millers, MD  fluticasone (FLONASE) 50 MCG/ACT nasal spray Place 1 spray into both nostrils 2 (two) times daily. 06/26/15   Arnetha Courser, MD  hydrALAZINE (APRESOLINE) 100 MG tablet Take 100 mg by mouth every 8 (eight) hours. 08/06/16 10/05/16  Charlotte Crumb, NP  hydrochlorothiazide (MICROZIDE) 12.5 MG capsule Take 1  capsule (12.5 mg total) by mouth daily. 06/03/16   McGowan, Larene Beach A, PA-C  hydrochlorothiazide (MICROZIDE) 12.5 MG capsule TAKE ONE CAPSULE BY MOUTH EVERY DAY 08/26/16   Tawni Millers, MD  isosorbide dinitrate (ISORDIL) 30 MG tablet Take 60 mg by mouth 2 (two) times daily. 08/06/16 10/05/16  [provider]  loperamide (IMODIUM) 2 MG capsule Take 2 mg by mouth as needed for diarrhea or loose stools (about once a week).    [provider]  mometasone (ELOCON) 0.1 % cream Apply 1 application topically daily. 12/04/15   Doles-Johnson, Teah, NP  Mometasone Furoate 110 MCG/INH AEPB Inhale 2 puffs into  the lungs 2 (two) times daily. 08/26/16 10/25/16  Park Liter P, DO  omeprazole (PRILOSEC) 20 MG capsule Take 1 capsule (20 mg total) by mouth daily. 03/11/16   McGowan, Larene Beach A, PA-C  ranitidine (ZANTAC) 150 MG tablet TAKE ONE TABLET BY MOUTH 2 TIMES A DAY 06/21/16   Tawni Millers, MD  VENTOLIN HFA 108 (90 Base) MCG/ACT inhaler INHALE 2 PUFFS EVERY FOUR HOURS AS NEEDED FOR COUGH AND WHEEZING. 12/16/15   Ernestine Conrad, Larene Beach A, PA-C    Allergies Coconut oil; Amoxicillin; and Penicillins  Family History  Problem Relation Age of Onset  . Hyperlipidemia Mother   . Heart disease Mother        has a defibrillator  . COPD Mother   . Hypertension Father   . Heart disease Father   . Hyperlipidemia Father   . Hypertension Sister        died in her 78s from heart failure and end stage kidney disease  . Kidney disease Sister   . Hypertension Brother   . Heart disease Brother        has a defibrillator  . Coronary artery disease Brother        had CABG    Social History Social History  Substance Use Topics  . Smoking status: Current Every Day Smoker    Packs/day: 0.25    Years: 18.00    Types: Cigarettes  . Smokeless tobacco: Never Used  . Alcohol use Yes     Comment: "seldom" - drinks 350-761mL when she does drink    Review of Systems Constitutional: No fever/chills Eyes: No visual changes. ENT: No sore throat. Cardiovascular: Positive for chest pain. Respiratory: Denies shortness of breath. Gastrointestinal: No abdominal pain.  No nausea, no vomiting.  No diarrhea.   Genitourinary: Negative for dysuria. Musculoskeletal: Negative for back pain. Skin: Negative for rash. Neurological: Negative for headaches, focal weakness or numbness.  ____________________________________________   PHYSICAL EXAM:  VITAL SIGNS: ED Triage Vitals  Enc Vitals Group     BP 08/28/16 1902 (!) 202/60     Pulse Rate 08/28/16 1902 85     Resp 08/28/16 1902 20     Temp 08/28/16 1902 99 F (37.2  C)     Temp Source 08/28/16 1902 Oral     SpO2 08/28/16 1859 98 %     Weight 08/28/16 1902 184 lb (83.5 kg)     Height 08/28/16 1902 5\' 1"  (1.549 m)     Head Circumference --      Peak Flow --      Pain Score 08/28/16 1901 6    Constitutional: Alert and oriented. Well appearing and in no distress. Eyes: Conjunctivae are normal.  ENT   Head: Normocephalic and atraumatic.   Nose: No congestion/rhinnorhea.   Mouth/Throat: Mucous membranes are moist.   Neck: No stridor.  Hematological/Lymphatic/Immunilogical: No cervical lymphadenopathy. Cardiovascular: Normal rate, regular rhythm.  No murmurs, rubs, or gallops. Respiratory: Normal respiratory effort without tachypnea nor retractions. Breath sounds are clear and equal bilaterally. No wheezes/rales/rhonchi. Gastrointestinal: Soft and non tender. No rebound. No guarding.  Genitourinary: Deferred Musculoskeletal: Normal range of motion in all extremities. No lower extremity edema. Neurologic:  Normal speech and language. No gross focal neurologic deficits are appreciated.  Skin:  Skin is warm, dry and intact. No rash noted. Psychiatric: Mood and affect are normal. Speech and behavior are normal. Patient exhibits appropriate insight and judgment.  ____________________________________________    LABS (pertinent positives/negatives)  Labs Reviewed  CBC - Abnormal; Notable for the following:       Result Value   RBC 3.01 (*)    Hemoglobin 9.6 (*)    HCT 28.7 (*)    Platelets 137 (*)    All other components within normal limits  TROPONIN I - Abnormal; Notable for the following:    Troponin I 0.25 (*)    All other components within normal limits  COMPREHENSIVE METABOLIC PANEL - Abnormal; Notable for the following:    Potassium 3.4 (*)    Chloride 112 (*)    CO2 20 (*)    BUN 23 (*)    Creatinine, Ser 2.45 (*)    Calcium 8.3 (*)    Total Protein 5.8 (*)    Albumin 2.9 (*)    AST 13 (*)    ALT 12 (*)    GFR calc non  Af Amer 23 (*)    GFR calc Af Amer 27 (*)    All other components within normal limits     ____________________________________________   EKG  I, Nance Pear, attending physician, personally viewed and interpreted this EKG  EKG Time: 1903 Rate: 78 Rhythm: sinus rhythm Axis: normal Intervals: qtc 428 QRS: LVH ST changes: no st elevation Impression: abnormal ekg   ____________________________________________    RADIOLOGY  CXR IMPRESSION: Mild lingular atelectasis.  ____________________________________________   PROCEDURES  Procedures  CRITICAL CARE Performed by: Nance Pear   Total critical care time: 30 minutes  Critical care time was exclusive of separately billable procedures and treating other patients.  Critical care was necessary to treat or prevent imminent or life-threatening deterioration.  Critical care was time spent personally by me on the following activities: development of treatment plan with patient and/or surrogate as well as nursing, discussions with consultants, evaluation of patient's response to treatment, examination of patient, obtaining history from patient or surrogate, ordering and performing treatments and interventions, ordering and review of laboratory studies, ordering and review of radiographic studies, pulse oximetry and re-evaluation of patient's condition.  ____________________________________________   INITIAL IMPRESSION / ASSESSMENT AND PLAN / ED COURSE  Pertinent labs & imaging results that were available during my care of the patient were reviewed by me and considered in my medical decision making (see chart for details).  Patient presented to the emergency department today because of concerns for chest pain and high blood pressure. Patient's EKG without STEMI. Blood work however did show elevation in troponin and creatinine. Do have concern for ACS vs hypertensive emergency. Patient will be admitted to  hospitalist service.   ____________________________________________   FINAL CLINICAL IMPRESSION(S) / ED DIAGNOSES  Final diagnoses:  Chest pain, unspecified type  Elevated serum creatinine  Hypertension, unspecified type  Elevated troponin     Note: This dictation was prepared with Dragon dictation. Any transcriptional errors that result from this process are unintentional  Nance Pear, MD 08/28/16 [phone]

## 2016-08-28 NOTE — ED Notes (Signed)
Pharmacy tech is coming to do med rec prior to the patient going to the floor.

## 2016-08-28 NOTE — ED Notes (Signed)
Patient states she is on Lovenox at home.

## 2016-08-28 NOTE — H&P (Signed)
[place] [facility] [place] NAME: Jaedyn Marrufo    MR#:  [id number]  DATE OF BIRTH:  [birth year]  DATE OF ADMISSION:  08/28/2016  PRIMARY CARE PHYSICIAN: Boyce Medici, FNP   REQUESTING/REFERRING PHYSICIAN: Danie Binder MD  CHIEF COMPLAINT:   Chief Complaint  Patient presents with  . Chest Pain    HISTORY OF PRESENT ILLNESS: Anthonia Monger  is a 46 y.o. female with a known history of GERD, [place], chronic kidney disease, previous history of CVA, essential hypertension who was recently admitted to [facility] for C-section. Patient at that time had elevated blood pressure her blood pressure medications were adjusted. She is coming to the emergency room complaining of substernal chest pressure. Patient again is noted to have elevated blood pressure here as well as  intermediately elevated troponin. Patient reports that her chest pain started yesterday and supple. No area of her chest. She also had some shortness of breath. She didn't have radiation of the pain no nausea or vomiting she also complains of swelling of the lower extremity.      PAST MEDICAL HISTORY:   Past Medical History:  Diagnosis Date  . Acid reflux   . Allergic rhinitis 06/26/2015  . Allergy   . Asthma   . Carpal tunnel syndrome    right hand  . Chronic kidney disease   . Colitis   . CVA (cerebral infarction)    2009, 2015  . History of syncope   . Hypertension 2008  . Low HDL (under 40) 06/26/2015  . MI (myocardial infarction) ([place])    2015 - patient wasnt told by cardiologist that she had MI but has had cardiac cath in past  . Migraines   . Poor circulation   . Renal insufficiency   . Stroke ([place])   . Syncope and collapse     PAST SURGICAL HISTORY: Past Surgical History:  Procedure Laterality Date  . CARDIAC CATHETERIZATION    . CESAREAN SECTION  1994   Dr. Ammie Dalton  . COLONOSCOPY WITH PROPOFOL N/A 06/26/2014   Procedure: COLONOSCOPY WITH PROPOFOL;  Surgeon: Josefine Class, MD;  Location: [facility] ENDOSCOPY;  Service: Endoscopy;  Laterality: N/A;  . DILATION AND CURETTAGE OF UTERUS  x2 for incomplete SABs    SOCIAL HISTORY:  Social History  Substance Use Topics  . Smoking status: Current Every Day Smoker    Packs/day: 0.25    Years: 18.00    Types: Cigarettes  . Smokeless tobacco: Never Used  . Alcohol use Yes     Comment: "seldom" - drinks 350-734mL when she does drink    FAMILY HISTORY:  Family History  Problem Relation Age of Onset  . Hyperlipidemia Mother   . Heart disease Mother        has a defibrillator  . COPD Mother   . Hypertension Father   . Heart disease Father   . Hyperlipidemia Father   . Hypertension Sister        died in her 24s from heart failure and end stage kidney disease  . Kidney disease Sister   . Hypertension Brother   . Heart disease Brother        has a defibrillator  . Coronary artery disease Brother        had CABG    DRUG ALLERGIES:  Allergies  Allergen Reactions  . Coconut Oil Swelling  . Amoxicillin Rash  . Penicillins Rash    REVIEW OF SYSTEMS:  CONSTITUTIONAL: No fever, fatigue or weakness.  EYES: No blurred or double vision.  EARS, NOSE, AND THROAT: No tinnitus or ear pain.  RESPIRATORY: No cough, shortness of breath, wheezing or hemoptysis.  CARDIOVASCULAR: Positive chest pain, orthopnea, positive edema.  GASTROINTESTINAL: No nausea, vomiting, diarrhea or abdominal pain.  GENITOURINARY: No dysuria, hematuria.  ENDOCRINE: No polyuria, nocturia,  HEMATOLOGY: No anemia, easy bruising or bleeding SKIN: No rash or lesion. MUSCULOSKELETAL: No joint pain or arthritis.   NEUROLOGIC: No tingling, numbness, weakness.  PSYCHIATRY: No anxiety or depression.   MEDICATIONS AT HOME:  Prior to Admission medications   Medication Sig Start Date End Date Taking? Authorizing Provider  amLODipine (NORVASC) 10 MG tablet Take 1 tablet (10 mg total) by mouth daily. 03/11/16 03/11/17  Zara Council A,  PA-C  aspirin 81 MG tablet Take 81 mg by mouth daily.    [provider]  carvedilol (COREG) 12.5 MG tablet Take 12.5 mg by mouth 2 (two) times daily. 08/06/16 10/05/16  Charlotte Crumb, NP  cetirizine (ZYRTEC) 10 MG tablet Take 1 tablet (10 mg total) by mouth daily. 03/02/16   Tawni Millers, MD  cloNIDine (CATAPRES) 0.3 MG tablet TAKE ONE TABLET BY MOUTH 2 TIMES A DAY 06/21/16   Tawni Millers, MD  fluticasone (FLONASE) 50 MCG/ACT nasal spray Place 1 spray into both nostrils 2 (two) times daily. 06/26/15   Arnetha Courser, MD  hydrALAZINE (APRESOLINE) 100 MG tablet Take 100 mg by mouth every 8 (eight) hours. 08/06/16 10/05/16  Charlotte Crumb, NP  hydrochlorothiazide (MICROZIDE) 12.5 MG capsule Take 1 capsule (12.5 mg total) by mouth daily. 06/03/16   McGowan, Larene Beach A, PA-C  hydrochlorothiazide (MICROZIDE) 12.5 MG capsule TAKE ONE CAPSULE BY MOUTH EVERY DAY 08/26/16   Tawni Millers, MD  isosorbide dinitrate (ISORDIL) 30 MG tablet Take 60 mg by mouth 2 (two) times daily. 08/06/16 10/05/16  [provider]  loperamide (IMODIUM) 2 MG capsule Take 2 mg by mouth as needed for diarrhea or loose stools (about once a week).    [provider]  mometasone (ELOCON) 0.1 % cream Apply 1 application topically daily. 12/04/15   Doles-Johnson, Teah, NP  Mometasone Furoate 110 MCG/INH AEPB Inhale 2 puffs into the lungs 2 (two) times daily. 08/26/16 10/25/16  Park Liter P, DO  omeprazole (PRILOSEC) 20 MG capsule Take 1 capsule (20 mg total) by mouth daily. 03/11/16   McGowan, Larene Beach A, PA-C  ranitidine (ZANTAC) 150 MG tablet TAKE ONE TABLET BY MOUTH 2 TIMES A DAY 06/21/16   Tawni Millers, MD  VENTOLIN HFA 108 (90 Base) MCG/ACT inhaler INHALE 2 PUFFS EVERY FOUR HOURS AS NEEDED FOR COUGH AND WHEEZING. 12/16/15   McGowan, Larene Beach A, PA-C      PHYSICAL EXAMINATION:   VITAL SIGNS: Blood pressure (!) 213/67, pulse 71, temperature 99 F (37.2 C), temperature source Oral, resp. rate  14, height 5\' 1"  (1.549 m), weight 184 lb (83.5 kg), last menstrual period 01/21/2016, SpO2 100 %.  GENERAL:  46 y.o.-year-old patient lying in the bed with no acute distress.  EYES: Pupils equal, round, reactive to light and accommodation. No scleral icterus. Extraocular muscles intact.  HEENT: Head atraumatic, normocephalic. Oropharynx and nasopharynx clear.  NECK:  Supple, no jugular venous distention. No thyroid enlargement, no tenderness.  LUNGS: Normal breath sounds bilaterally, no wheezing, rales,rhonchi or crepitation. No use of accessory muscles of respiration.  CARDIOVASCULAR: S1, S2 normal. No murmurs, rubs, or gallops.  ABDOMEN: Soft, nontender, nondistended. Bowel  sounds present. No organomegaly or mass.  EXTREMITIES: 2+ pedal edema, cyanosis, or clubbing.  NEUROLOGIC: Cranial nerves II through XII are intact. Muscle strength 5/5 in all extremities. Sensation intact. Gait not checked.  PSYCHIATRIC: The patient is alert and oriented x 3.  SKIN: No obvious rash, lesion, or ulcer.   LABORATORY PANEL:   CBC  Recent Labs Lab 08/28/16 1912  WBC 7.6  HGB 9.6*  HCT 28.7*  PLT 137*  MCV 95.5  MCH 31.9  MCHC 33.4  RDW 13.5   ------------------------------------------------------------------------------------------------------------------  Chemistries   Recent Labs Lab 08/28/16 1912  NA 138  K 3.4*  CL 112*  CO2 20*  GLUCOSE 94  BUN 23*  CREATININE 2.45*  CALCIUM 8.3*  AST 13*  ALT 12*  ALKPHOS 75  BILITOT 0.3   ------------------------------------------------------------------------------------------------------------------ estimated creatinine clearance is 29 mL/min (A) (by C-G formula based on SCr of 2.45 mg/dL (H)). ------------------------------------------------------------------------------------------------------------------ No results for input(s): TSH, T4TOTAL, T3FREE, THYROIDAB in the last 72 hours.  Invalid input(s): FREET3   Coagulation  profile No results for input(s): INR, PROTIME in the last 168 hours. ------------------------------------------------------------------------------------------------------------------- No results for input(s): DDIMER in the last 72 hours. -------------------------------------------------------------------------------------------------------------------  Cardiac Enzymes  Recent Labs Lab 08/28/16 1912  TROPONINI 0.25*   ------------------------------------------------------------------------------------------------------------------ Invalid input(s): POCBNP  ---------------------------------------------------------------------------------------------------------------  Urinalysis    Component Value Date/Time   COLORURINE YELLOW (A) 11/13/2015 1720   APPEARANCEUR CLEAR (A) 11/13/2015 1720   APPEARANCEUR Cloudy (A) 10/22/2015 1044   LABSPEC 1.004 (L) 11/13/2015 1720   LABSPEC 1.006 05/18/2013 2113   PHURINE 5.0 11/13/2015 1720   GLUCOSEU NEGATIVE 11/13/2015 1720   GLUCOSEU Negative 05/18/2013 2113   HGBUR 1+ (A) 11/13/2015 1720   BILIRUBINUR NEGATIVE 11/13/2015 1720   BILIRUBINUR Negative 10/22/2015 1044   BILIRUBINUR Negative 05/18/2013 2113   KETONESUR NEGATIVE 11/13/2015 1720   PROTEINUR 100 (A) 11/13/2015 1720   NITRITE NEGATIVE 11/13/2015 1720   LEUKOCYTESUR NEGATIVE 11/13/2015 1720   LEUKOCYTESUR 3+ (A) 10/22/2015 1044   LEUKOCYTESUR Negative 05/18/2013 2113     RADIOLOGY: Dg Chest 2 View  Result Date: 08/28/2016 CLINICAL DATA:  Substernal chest pain EXAM: CHEST  2 VIEW COMPARISON:  02/07/2015 FINDINGS: Cardiac shadow is within normal limits. The lungs are well aerated bilaterally. Minimal atelectatic changes are noted the lingula. No pleural effusion is noted. No bony abnormality is noted. IMPRESSION: Mild lingular atelectasis. Electronically Signed   By: Inez Catalina M.D.   On: 08/28/2016 19:36    EKG: Orders placed or performed during the hospital encounter of  08/28/16  . EKG 12-Lead  . EKG 12-Lead  . ED EKG within 10 minutes  . ED EKG within 10 minutes  . ED EKG  . ED EKG    IMPRESSION AND PLAN: Patient is a 46 year old female status post recent C-section with chest pain  1. Chest pain could be related to accelerated hypertension He reports having a cardiac catheter in 2014 which she was told was minimally blockage Will treat with aspirin Cycle cardiac enzyme Cardiology consult for the morning We'll try to control her blood pressure  2. Accelerated blood pressure We will give her dose of IV hydralazine prn She does not want to use her oral clonidine because gives her severe headache I will treat patient with clonidine she was on twice daily we'll change it to 3 times a daily Continue Norvasc she does have lower extremity swelling may need to discontinue in the future Use IV metoprolol as needed if blood pressure allows Continue  Coreg  3. Acute renal failure on chronic kidney disease We will give her IV fluids but again she has swelling in the lower extremity will need to monitor renal function and her volume status closely I will asked nephrology to see  4. Asthma continue inhalers as taking at home   5. Miscellaneous heparin for DVT prophylaxis   All the records are reviewed and case discussed with ED provider. Management plans discussed with the patient, family and they are in agreement.  CODE STATUS: Code Status History    Date Active Date Inactive Code Status Order ID Comments User Context   06/25/2014 11:55 AM 06/27/2014  2:01 PM Full Code [id number]  Henreitta Leber, MD Inpatient       TOTAL TIME TAKING CARE OF THIS PATIENT: 55 minutes.    Dustin Flock M.D on 08/28/2016 at 9:13 PM  Between 7am to 6pm - Pager - [phone]  After 6pm go to [email or web address] - password [email or web address] [facility]  Office  [phone]  CC: Primary care physician; Boyce Medici, FNP

## 2016-08-28 NOTE — ED Notes (Signed)
Dr. Archie Balboa aware of Troponin of 0.25.

## 2016-08-28 NOTE — ED Notes (Signed)
Dr. Posey Pronto at bedside to discuss hydralazine with the patient. Patient has refused hydralazine due to the medication causing headaches in the past.

## 2016-08-28 NOTE — ED Notes (Signed)
Patient states she hasn't taken her hydralazine due to giving her a headache.

## 2016-08-28 NOTE — ED Triage Notes (Signed)
Per EMS report, patient reports substernal chest pains that have been intermittent since last night, but constant and sharp for the last two hours. Patient is 3 weeks post-op c-section. Patient has a positive cardiac history.

## 2016-08-29 LAB — BASIC METABOLIC PANEL
ANION GAP: 5 (ref 5–15)
BUN: 25 mg/dL — ABNORMAL HIGH (ref 6–20)
CALCIUM: 8 mg/dL — AB (ref 8.9–10.3)
CO2: 21 mmol/L — ABNORMAL LOW (ref 22–32)
Chloride: 114 mmol/L — ABNORMAL HIGH (ref 101–111)
Creatinine, Ser: 2.58 mg/dL — ABNORMAL HIGH (ref 0.44–1.00)
GFR, EST AFRICAN AMERICAN: 25 mL/min — AB (ref >60)
GFR, EST NON AFRICAN AMERICAN: 22 mL/min — AB (ref >60)
GLUCOSE: 98 mg/dL (ref 65–99)
POTASSIUM: 3.3 mmol/L — AB (ref 3.5–5.1)
SODIUM: 140 mmol/L (ref 135–145)

## 2016-08-29 LAB — CBC
HCT: 24.8 % — ABNORMAL LOW (ref 35.0–47.0)
Hemoglobin: 8.2 g/dL — ABNORMAL LOW (ref 12.0–16.0)
MCH: 31.1 pg (ref 26.0–34.0)
MCHC: 32.9 g/dL (ref 32.0–36.0)
MCV: 94.7 fL (ref 80.0–100.0)
PLATELETS: 122 10*3/uL — AB (ref 150–440)
RBC: 2.62 MIL/uL — AB (ref 3.80–5.20)
RDW: 14.1 % (ref 11.5–14.5)
WBC: 6.5 10*3/uL (ref 3.6–11.0)

## 2016-08-29 LAB — TROPONIN I
TROPONIN I: 0.29 ng/mL — AB (ref <0.03)
TROPONIN I: 0.34 ng/mL — AB (ref <0.03)
TROPONIN I: 0.3 ng/mL — AB (ref <0.03)

## 2016-08-29 MED ORDER — CARVEDILOL 25 MG PO TABS
37.5000 mg | ORAL_TABLET | Freq: Two times a day (BID) | ORAL | Status: DC
  Administered 2016-08-29 – 2016-08-30 (×3): 37.5 mg via ORAL
  Filled 2016-08-29 (×3): qty 1

## 2016-08-29 MED ORDER — FUROSEMIDE 40 MG PO TABS
40.0000 mg | ORAL_TABLET | Freq: Two times a day (BID) | ORAL | Status: DC
  Administered 2016-08-29 – 2016-08-30 (×3): 40 mg via ORAL
  Filled 2016-08-29 (×3): qty 1

## 2016-08-29 NOTE — Progress Notes (Signed)
Pt refused some of her HS and morning medications, stating hydralazine gives her a headache and she would prefer clonidine or better yet her regular medications.

## 2016-08-29 NOTE — Progress Notes (Addendum)
[facility] [place] NAME: Gabrielle Schultz    MR#:  [id number]  DATE OF BIRTH:  [birth year]  SUBJECTIVE:  Denies CP BP elevated. Some issues having HA with hydralazine  REVIEW OF SYSTEMS:   Review of Systems  Constitutional: Negative for chills, fever and weight loss.  HENT: Negative for ear discharge, ear pain and nosebleeds.   Eyes: Negative for blurred vision, pain and discharge.  Respiratory: Negative for sputum production, shortness of breath, wheezing and stridor.   Cardiovascular: Negative for chest pain, palpitations, orthopnea and PND.  Gastrointestinal: Negative for abdominal pain, diarrhea, nausea and vomiting.  Genitourinary: Negative for frequency and urgency.  Musculoskeletal: Negative for back pain and joint pain.  Neurological: Positive for weakness. Negative for sensory change, speech change and focal weakness.  Psychiatric/Behavioral: Negative for depression and hallucinations. The patient is not nervous/anxious.    Tolerating Diet:yes Tolerating PT: ambulatory  DRUG ALLERGIES:   Allergies  Allergen Reactions  . Coconut Oil Swelling  . Amoxicillin Rash  . Penicillins Itching and Other (See Comments)    Has patient had a PCN reaction causing immediate rash, facial/tongue/throat swelling, SOB or lightheadedness with hypotension: No Has patient had a PCN reaction causing severe rash involving mucus membranes or skin necrosis: No Has patient had a PCN reaction that required hospitalization: No Has patient had a PCN reaction occurring within the last 10 years: Yes If all of the above answers are "NO", then may proceed with Cephalosporin use.     VITALS:  Blood pressure (!) 191/66, pulse 70, temperature 98.4 F (36.9 C), temperature source Oral, resp. rate 18, height 5\' 1"  (1.549 m), weight 83.5 kg (184 lb), last menstrual period 01/21/2016, SpO2 100 %, unknown if currently breastfeeding.  PHYSICAL EXAMINATION:    Physical Exam  GENERAL:  46 y.o.-year-old patient lying in the bed with no acute distress.  EYES: Pupils equal, round, reactive to light and accommodation. No scleral icterus. Extraocular muscles intact.  HEENT: Head atraumatic, normocephalic. Oropharynx and nasopharynx clear.  NECK:  Supple, no jugular venous distention. No thyroid enlargement, no tenderness.  LUNGS: Normal breath sounds bilaterally, no wheezing, rales, rhonchi. No use of accessory muscles of respiration.  CARDIOVASCULAR: S1, S2 normal. No murmurs, rubs, or gallops.  ABDOMEN: Soft, nontender, nondistended. Bowel sounds present. No organomegaly or mass.  EXTREMITIES: No cyanosis, clubbing or edema b/l.    NEUROLOGIC: Cranial nerves II through XII are intact. No focal Motor or sensory deficits b/l.   PSYCHIATRIC:  patient is alert and oriented x 3.  SKIN: No obvious rash, lesion, or ulcer.   LABORATORY PANEL:  CBC  Recent Labs Lab 08/29/16 0129  WBC 6.5  HGB 8.2*  HCT 24.8*  PLT 122*    Chemistries   Recent Labs Lab 08/28/16 1912 08/29/16 0129  NA 138 140  K 3.4* 3.3*  CL 112* 114*  CO2 20* 21*  GLUCOSE 94 98  BUN 23* 25*  CREATININE 2.45* 2.58*  CALCIUM 8.3* 8.0*  AST 13*  --   ALT 12*  --   ALKPHOS 75  --   BILITOT 0.3  --    Cardiac Enzymes  Recent Labs Lab 08/29/16 0129  TROPONINI 0.29*   RADIOLOGY:  Dg Chest 2 View  Result Date: 08/28/2016 CLINICAL DATA:  Substernal chest pain EXAM: CHEST  2 VIEW COMPARISON:  02/07/2015 FINDINGS: Cardiac shadow is within normal limits. The lungs are well aerated bilaterally. Minimal atelectatic changes are noted the lingula.  No pleural effusion is noted. No bony abnormality is noted. IMPRESSION: Mild lingular atelectasis. Electronically Signed   By: Inez Catalina M.D.   On: 08/28/2016 19:36   ASSESSMENT AND PLAN:  46 year old female with h/o Chronic HTN, CKD-III who is status post recent C-section in July 2018 came in  with chest pain  1. Chest pain  could be related to accelerated hypertension He reports having a cardiac catheterization in 2014 which she was told was minimally blockage cont aspirin Cycle cardiac enzyme 0.25--0.29 Cardiology consult pending We'll try to control her blood pressure  2. Accelerated blood pressure -pt refused her hydralazine (gives headaches---d/ced) -Continue Coreg, amlodipine, clonidine  3.  chronic kidney disease stage III -Nephrology to see -pt used to go to [facility] nephro but now does not  4. Asthma continue inhalers as taking at home  5. Miscellaneous heparin for DVT prophylaxis   Case discussed with Care Management/Social Worker. Management plans discussed with the patient, family and they are in agreement.  CODE STATUS: FULL  DVT Prophylaxis: heparin  TOTAL TIME TAKING CARE OF THIS PATIENT: 30 minutes.  >50% time spent on counselling and coordination of care  POSSIBLE D/C IN 1-2 DAYS, DEPENDING ON CLINICAL CONDITION.  Note: This dictation was prepared with Dragon dictation along with smaller phrase technology. Any transcriptional errors that result from this process are unintentional.  Joevanni Roddey M.D on 08/29/2016 at 7:38 AM  Between 7am to 6pm - Pager - [phone]  After 6pm go to [email or web address] - password [email or web address] [facility]  Office  [phone]  CC: Primary care physician; Boyce Medici, FNP

## 2016-08-29 NOTE — Consult Note (Signed)
Date: 08/29/2016                  Patient Name:  Gabrielle Schultz  MRN: [id number]  DOB: [birth year]  Age / Sex: 46 y.o., female         PCP: Boyce Medici, FNP                 Service Requesting Consult: Hospitalists/ Fritzi Mandes, MD                 Reason for Consult: Acute on chronic kidney disease             History of Present Illness: Patient is a 46 y.o. female with medical problems of Poorly controlled hypertension, peripheral vascular disease, stroke 2, irregular heartbeat, C-section July 2018, chronic kidney disease, coronary disease, who was admitted to [facility] on 08/28/2016 for evaluation of substernal chest pains.  Patient reports that after her C-section, her primary care physicians at [facility] were adjusting her antihypertensives. Her blood pressure has been poorly controlled lately. She now has developed lower extremity edema. She tries to follow low salt diet. She was recently placed on hydralazine but did not tolerated. She states it gives her headache. She now comes in for evaluation of chest pain. Appetite is good at present. No nausea or vomiting reported. Patient used to be followed by Dr. Johnney Ou of [facility] nephrology but stopped going there since February. Her baseline creatinine appears to be 1.5-1.7 from June and July 2018. Corresponding GFR 36-41. She underwent a CT angiography on July 17. That is when she found out she was pregnant. She was gaining weight prior to that but thought she was perimenopausal. She then underwent C-section for delivery of the baby. The premature baby is at present in the hospital for care.  Patient's sister was on dialysis. She developed renal failure during her pregnancy. She went on dialysis after 5 years. She has now passed away. Cause unknown but likely coronary disease as patient's sister was found down in her bathroom.    Outpatient medications: Facility-Administered Medications Prior to Admission  Medication Dose Route Frequency Provider  Last Rate Last Dose  . beclomethasone (QVAR) 40 MCG/ACT inhaler 1 puff  1 puff Inhalation BID McGowan, Shannon A, PA-C       Prescriptions Prior to Admission  Medication Sig Dispense Refill Last Dose  . carvedilol (COREG) 12.5 MG tablet Take 37.5 mg by mouth 2 (two) times daily.    08/28/2016 at 0700  . enoxaparin (LOVENOX) 40 MG/0.4ML injection Inject 40 mg into the skin daily.   08/28/2016 at Unknown time  . fluticasone (FLONASE) 50 MCG/ACT nasal spray Place 1 spray into both nostrils 2 (two) times daily. 16 g 11 08/28/2016 at Unknown time  . hydrALAZINE (APRESOLINE) 100 MG tablet Take 100 mg by mouth every 8 (eight) hours.   08/28/2016 at 0700  . isosorbide dinitrate (ISORDIL) 20 MG tablet Take 60 mg by mouth 2 (two) times daily.   08/28/2016 at Unknown time  . Mometasone Furoate 110 MCG/INH AEPB Inhale 2 puffs into the lungs 2 (two) times daily. 1 Inhaler 3 08/28/2016 at Unknown time  . omeprazole (PRILOSEC) 20 MG capsule Take 1 capsule (20 mg total) by mouth daily. 90 capsule 0 08/28/2016 at Unknown time  . polyethylene glycol (MIRALAX / GLYCOLAX) packet Take 17 g by mouth daily.   08/28/2016 at Unknown time  . VENTOLIN HFA 108 (90 Base) MCG/ACT inhaler INHALE 2 PUFFS EVERY FOUR HOURS  AS NEEDED FOR COUGH AND WHEEZING. 54 g 0 prn at prn    Current medications: Current Facility-Administered Medications  Medication Dose Route Frequency Provider Last Rate Last Dose  . acetaminophen (TYLENOL) tablet 650 mg  650 mg Oral Q6H PRN Dustin Flock, MD       Or  . acetaminophen (TYLENOL) suppository 650 mg  650 mg Rectal Q6H PRN Dustin Flock, MD      . albuterol (PROVENTIL) (2.5 MG/3ML) 0.083% nebulizer solution 3 mL  3 mL Inhalation Q4H PRN Dustin Flock, MD      . amLODipine (NORVASC) tablet 10 mg  10 mg Oral Daily Dustin Flock, MD   10 mg at 08/29/16 0859  . aspirin EC tablet 325 mg  325 mg Oral Daily Dustin Flock, MD   325 mg at 08/29/16 0859  . budesonide (PULMICORT) nebulizer solution  0.25 mg  0.25 mg Nebulization BID Dustin Flock, MD   0.25 mg at 08/29/16 0806  . carvedilol (COREG) tablet 37.5 mg  37.5 mg Oral BID Fritzi Mandes, MD   37.5 mg at 08/29/16 0900  . cloNIDine (CATAPRES) tablet 0.3 mg  0.3 mg Oral TID Dustin Flock, MD   0.3 mg at 08/29/16 0900  . famotidine (PEPCID) tablet 10 mg  10 mg Oral Daily Dustin Flock, MD   10 mg at 08/29/16 0858  . fluticasone (FLONASE) 50 MCG/ACT nasal spray 1 spray  1 spray Each Nare BID Dustin Flock, MD   1 spray at 08/29/16 [phone]  . furosemide (LASIX) tablet 40 mg  40 mg Oral BID Murlean Iba, MD      . heparin injection 5,000 Units  5,000 Units Subcutaneous Q8H Dustin Flock, MD   5,000 Units at 08/29/16 [phone]  . loperamide (IMODIUM) capsule 2 mg  2 mg Oral PRN Dustin Flock, MD      . loratadine (CLARITIN) tablet 10 mg  10 mg Oral Daily Dustin Flock, MD   10 mg at 08/29/16 0859  . metoprolol tartrate (LOPRESSOR) injection 5 mg  5 mg Intravenous Q4H PRN Dustin Flock, MD      . ondansetron (ZOFRAN) tablet 4 mg  4 mg Oral Q6H PRN Dustin Flock, MD       Or  . ondansetron (ZOFRAN) injection 4 mg  4 mg Intravenous Q6H PRN Dustin Flock, MD      . pantoprazole (PROTONIX) EC tablet 40 mg  40 mg Oral Daily Dustin Flock, MD   40 mg at 08/29/16 0857      Allergies: Allergies  Allergen Reactions  . Coconut Oil Swelling  . Amoxicillin Rash  . Penicillins Itching and Other (See Comments)    Has patient had a PCN reaction causing immediate rash, facial/tongue/throat swelling, SOB or lightheadedness with hypotension: No Has patient had a PCN reaction causing severe rash involving mucus membranes or skin necrosis: No Has patient had a PCN reaction that required hospitalization: No Has patient had a PCN reaction occurring within the last 10 years: Yes If all of the above answers are "NO", then may proceed with Cephalosporin use.       Past Medical History: Past Medical History:  Diagnosis Date  . Acid reflux    . Allergic rhinitis 06/26/2015  . Allergy   . Asthma   . Carpal tunnel syndrome    right hand  . Chronic kidney disease   . Colitis   . CVA (cerebral infarction)    2009, 2015  . History of syncope   . Hypertension 2008  .  Low HDL (under 40) 06/26/2015  . MI (myocardial infarction) ([place])    2015 - patient wasnt told by cardiologist that she had MI but has had cardiac cath in past  . Migraines   . Poor circulation   . Renal insufficiency   . Stroke ([place])   . Syncope and collapse      Past Surgical History: Past Surgical History:  Procedure Laterality Date  . CARDIAC CATHETERIZATION    . CESAREAN SECTION  1994   Dr. Ammie Dalton  . COLONOSCOPY WITH PROPOFOL N/A 06/26/2014   Procedure: COLONOSCOPY WITH PROPOFOL;  Surgeon: Josefine Class, MD;  Location: [facility] ENDOSCOPY;  Service: Endoscopy;  Laterality: N/A;  . DILATION AND CURETTAGE OF UTERUS  x2 for incomplete SABs     Family History: Family History  Problem Relation Age of Onset  . Hyperlipidemia Mother   . Heart disease Mother        has a defibrillator  . COPD Mother   . Hypertension Father   . Heart disease Father   . Hyperlipidemia Father   . Hypertension Sister        died in her 75s from heart failure and end stage kidney disease  . Kidney disease Sister   . Hypertension Brother   . Heart disease Brother        has a defibrillator  . Coronary artery disease Brother        had CABG     Social History: Social History   Social History  . Marital status: Married    Spouse name: N/A  . Number of children: 1  . Years of education: N/A   Occupational History  . Not on file.   Social History Main Topics  . Smoking status: Current Every Day Smoker    Packs/day: 0.25    Years: 18.00    Types: Cigarettes  . Smokeless tobacco: Never Used  . Alcohol use Yes     Comment: "seldom" - drinks 350-739mL when she does drink  . Drug use: No  . Sexual activity: Yes    Partners: Male    Birth control/  protection: None   Other Topics Concern  . Not on file   Social History Narrative  . No narrative on file     Review of Systems: Gen: Denies any fevers or chills  HEENT:  Denies any vision or hearing problems CV:  Chest pain upon admission, resolved now, does have lower extremity edema Resp:  no cough or sputum production GI: appetite is good, no nausea vomiting or diarrhea  GU :  no problems with voiding  MS: No acute joint pains or swelling reported  Derm:  No rashes reported Psych: no complaints  Heme: No complaints Neuro: No complaints  Endocrine no complaints   Vital Signs: Blood pressure (!) 191/66, pulse 70, temperature 98.4 F (36.9 C), temperature source Oral, resp. rate 18, height 5\' 1"  (1.549 m), weight 83.5 kg (184 lb), last menstrual period 01/21/2016, SpO2 100 %, unknown if currently breastfeeding.   Intake/Output Summary (Last 24 hours) at 08/29/16 1122 Last data filed at 08/29/16 6045  Gross per 24 hour  Intake           536.25 ml  Output                0 ml  Net           536.25 ml    Weight trends: [facility] Weights   08/28/16 1902  Weight:  83.5 kg (184 lb)    Physical Exam: General:  No acute distress, sitting up in the bed   HEENT  anicteric, moist oral mucous membranes   Neck:  Supple, no thyromegaly   Lungs: Clear to auscultation   Heart::  Regular, no rub or gallop   Abdomen: Soft, nontender, nondistended  Extremities:  2+ pitting edema bilaterally   Neurologic: Alert, oriented   Skin: No acute rashes              Lab results: Basic Metabolic Panel:  Recent Labs Lab 08/28/16 1912 08/29/16 0129  NA 138 140  K 3.4* 3.3*  CL 112* 114*  CO2 20* 21*  GLUCOSE 94 98  BUN 23* 25*  CREATININE 2.45* 2.58*  CALCIUM 8.3* 8.0*    Liver Function Tests:  Recent Labs Lab 08/28/16 1912  AST 13*  ALT 12*  ALKPHOS 75  BILITOT 0.3  PROT 5.8*  ALBUMIN 2.9*   No results for input(s): LIPASE, AMYLASE in the last 168 hours. No  results for input(s): AMMONIA in the last 168 hours.  CBC:  Recent Labs Lab 08/28/16 1912 08/29/16 0129  WBC 7.6 6.5  HGB 9.6* 8.2*  HCT 28.7* 24.8*  MCV 95.5 94.7  PLT 137* 122*    Cardiac Enzymes:  Recent Labs Lab 08/29/16 0714  TROPONINI 0.34*    BNP: Invalid input(s): POCBNP  CBG: No results for input(s): GLUCAP in the last 168 hours.  Microbiology: No results found for this or any previous visit (from the past 720 hour(s)).   Coagulation Studies: No results for input(s): LABPROT, INR in the last 72 hours.  Urinalysis: No results for input(s): COLORURINE, LABSPEC, PHURINE, GLUCOSEU, HGBUR, BILIRUBINUR, KETONESUR, PROTEINUR, UROBILINOGEN, NITRITE, LEUKOCYTESUR in the last 72 hours.  Invalid input(s): APPERANCEUR      Imaging: Dg Chest 2 View  Result Date: 08/28/2016 CLINICAL DATA:  Substernal chest pain EXAM: CHEST  2 VIEW COMPARISON:  02/07/2015 FINDINGS: Cardiac shadow is within normal limits. The lungs are well aerated bilaterally. Minimal atelectatic changes are noted the lingula. No pleural effusion is noted. No bony abnormality is noted. IMPRESSION: Mild lingular atelectasis. Electronically Signed   By: Inez Catalina M.D.   On: 08/28/2016 19:36      Assessment & Plan: Pt is a 46 y.o.   female with Poorly controlled hypertension, peripheral vascular disease, stroke 2, irregular heartbeat, C-section July 2018, chronic kidney disease, coronary disease, was admitted on 08/28/2016 with chest pain, poorly controlled hypertension and edema   1. Acute renal failure on chronic kidney disease stage III Baseline creatinine 1.5-1.7 from June/July 2018. GFR 36 to 41.  Admission creatinine is 2.45. Today's creatinine increased slightly to 2.6.  Acute kidney injury is likely secondary to IV contrast exposure. Other differential includes injury from malignant hypertension  Underlying chronic kidney disease is likely secondary to atherosclerosis and hypertension    2. Severe hypertension with lower extremity edema  Plan: Strict low-salt diet Start Lasix 40 mg twice a day Assess response and adjust antihypertensives after better volume control We'll obtain urinalysis and urine protein to creatinine ratio Will need to consider ACE-I or ARB at some point if patient is not planning to breast feed the new born

## 2016-08-29 NOTE — Consult Note (Signed)
[facility] Consultation Note  Patient ID: Gabrielle Schultz, MRN: [id number], DOB/AGE: 09-18-1970 46 y.o. Admit date: 08/28/2016   Date of Consult: 08/29/2016 Primary Physician: Boyce Medici, FNP Primary Cardiologist: None  Chief Complaint:  Chief Complaint  Patient presents with  . Chest Pain   Reason for Consult: chest pain with elevated troponin  HPI: 46 y.o. female with known chronic kidney disease stage IV, peripheral vascular disease with previous stroke, and essential hypertension had difficulty with blood pressure control due to multiple medication side effects. The patient comes in after having surgical intervention and had episodes of chest pressure and abdominal discomfort and overall malaise. The patient has had improvements of these symptoms at this time but has had an elevated troponin of 0.04 without any peaked more consistent with the kidney dysfunction rather than acute coronary syndrome. The patient has not had any heart failure type symptoms at this time and may be able to further ambulate and have further outpatient evaluation if ambulating well today  Past Medical History:  Diagnosis Date  . Acid reflux   . Allergic rhinitis 06/26/2015  . Allergy   . Asthma   . Carpal tunnel syndrome    right hand  . Chronic kidney disease   . Colitis   . CVA (cerebral infarction)    2009, 2015  . History of syncope   . Hypertension 2008  . Low HDL (under 40) 06/26/2015  . MI (myocardial infarction) ([place])    2015 - patient wasnt told by cardiologist that she had MI but has had cardiac cath in past  . Migraines   . Poor circulation   . Renal insufficiency   . Stroke ([place])   . Syncope and collapse       Surgical History:  Past Surgical History:  Procedure Laterality Date  . CARDIAC CATHETERIZATION    . CESAREAN SECTION  1994   Dr. Ammie Dalton  . COLONOSCOPY WITH PROPOFOL N/A 06/26/2014   Procedure: COLONOSCOPY WITH PROPOFOL;  Surgeon: Josefine Class, MD;   Location: [facility] ENDOSCOPY;  Service: Endoscopy;  Laterality: N/A;  . DILATION AND CURETTAGE OF UTERUS  x2 for incomplete SABs     Home Meds: Prior to Admission medications   Medication Sig Start Date End Date Taking? Authorizing Provider  carvedilol (COREG) 12.5 MG tablet Take 37.5 mg by mouth 2 (two) times daily.  08/06/16 10/05/16 Yes Haberman, Dareen Piano, NP  enoxaparin (LOVENOX) 40 MG/0.4ML injection Inject 40 mg into the skin daily.   Yes [provider]  fluticasone (FLONASE) 50 MCG/ACT nasal spray Place 1 spray into both nostrils 2 (two) times daily. 06/26/15  Yes Lada, Satira Anis, MD  hydrALAZINE (APRESOLINE) 100 MG tablet Take 100 mg by mouth every 8 (eight) hours. 08/06/16 10/05/16 Yes Haberman, Dareen Piano, NP  isosorbide dinitrate (ISORDIL) 20 MG tablet Take 60 mg by mouth 2 (two) times daily. 08/06/16 10/05/16 Yes [provider]  Mometasone Furoate 110 MCG/INH AEPB Inhale 2 puffs into the lungs 2 (two) times daily. 08/26/16 10/25/16 Yes Johnson, Megan P, DO  omeprazole (PRILOSEC) 20 MG capsule Take 1 capsule (20 mg total) by mouth daily. 03/11/16  Yes McGowan, Larene Beach A, PA-C  polyethylene glycol (MIRALAX / GLYCOLAX) packet Take 17 g by mouth daily.   Yes [provider]  VENTOLIN HFA 108 (90 Base) MCG/ACT inhaler INHALE 2 PUFFS EVERY FOUR HOURS AS NEEDED FOR COUGH AND WHEEZING. 12/16/15  Yes McGowan, Hunt Oris, PA-C    Inpatient Medications:  .  amLODipine  10 mg Oral Daily  . aspirin EC  325 mg Oral Daily  . budesonide (PULMICORT) nebulizer solution  0.25 mg Nebulization BID  . carvedilol  37.5 mg Oral BID  . cloNIDine  0.3 mg Oral TID  . famotidine  10 mg Oral Daily  . fluticasone  1 spray Each Nare BID  . heparin  5,000 Units Subcutaneous Q8H  . loratadine  10 mg Oral Daily  . pantoprazole  40 mg Oral Daily     Allergies:  Allergies  Allergen Reactions  . Coconut Oil Swelling  . Amoxicillin Rash  . Penicillins Itching and Other (See Comments)     Has patient had a PCN reaction causing immediate rash, facial/tongue/throat swelling, SOB or lightheadedness with hypotension: No Has patient had a PCN reaction causing severe rash involving mucus membranes or skin necrosis: No Has patient had a PCN reaction that required hospitalization: No Has patient had a PCN reaction occurring within the last 10 years: Yes If all of the above answers are "NO", then may proceed with Cephalosporin use.     Social History   Social History  . Marital status: Married    Spouse name: N/A  . Number of children: 1  . Years of education: N/A   Occupational History  . Not on file.   Social History Main Topics  . Smoking status: Current Every Day Smoker    Packs/day: 0.25    Years: 18.00    Types: Cigarettes  . Smokeless tobacco: Never Used  . Alcohol use Yes     Comment: "seldom" - drinks 350-710mL when she does drink  . Drug use: No  . Sexual activity: Yes    Partners: Male    Birth control/ protection: None   Other Topics Concern  . Not on file   Social History Narrative  . No narrative on file     Family History  Problem Relation Age of Onset  . Hyperlipidemia Mother   . Heart disease Mother        has a defibrillator  . COPD Mother   . Hypertension Father   . Heart disease Father   . Hyperlipidemia Father   . Hypertension Sister        died in her 64s from heart failure and end stage kidney disease  . Kidney disease Sister   . Hypertension Brother   . Heart disease Brother        has a defibrillator  . Coronary artery disease Brother        had CABG     Review of Systems Positive for Shortness of breath weakness chest pain Negative for: General:  chills, fever, night sweats or weight changes.  Cardiovascular: PND orthopnea syncope dizziness  Dermatological skin lesions rashes Respiratory: Cough congestion Urologic: Frequent urination urination at night and hematuria Abdominal: negative for nausea, vomiting, diarrhea,  bright red blood per rectum, melena, or hematemesis Neurologic: negative for visual changes, and/or hearing changes  All other systems reviewed and are otherwise negative except as noted above.  Labs:  Recent Labs  08/28/16 1912 08/29/16 0129 08/29/16 0714  TROPONINI 0.25* 0.29* 0.34*   Lab Results  Component Value Date   WBC 6.5 08/29/2016   HGB 8.2 (L) 08/29/2016   HCT 24.8 (L) 08/29/2016   MCV 94.7 08/29/2016   PLT 122 (L) 08/29/2016    Recent Labs Lab 08/28/16 1912 08/29/16 0129  NA 138 140  K 3.4* 3.3*  CL 112* 114*  CO2  20* 21*  BUN 23* 25*  CREATININE 2.45* 2.58*  CALCIUM 8.3* 8.0*  PROT 5.8*  --   BILITOT 0.3  --   ALKPHOS 75  --   ALT 12*  --   AST 13*  --   GLUCOSE 94 98   Lab Results  Component Value Date   CHOL 135 10/22/2015   HDL 39 (L) 10/22/2015   LDLCALC 80 10/22/2015   TRIG 80 10/22/2015   No results found for: DDIMER  Radiology/Studies:  Dg Chest 2 View  Result Date: 08/28/2016 CLINICAL DATA:  Substernal chest pain EXAM: CHEST  2 VIEW COMPARISON:  02/07/2015 FINDINGS: Cardiac shadow is within normal limits. The lungs are well aerated bilaterally. Minimal atelectatic changes are noted the lingula. No pleural effusion is noted. No bony abnormality is noted. IMPRESSION: Mild lingular atelectasis. Electronically Signed   By: Inez Catalina M.D.   On: 08/28/2016 19:36    EKG: Normal sinus rhythm  Weights: Filed Weights   08/28/16 1902  Weight: 83.5 kg (184 lb)     Physical Exam: Blood pressure (!) 191/66, pulse 70, temperature 98.4 F (36.9 C), temperature source Oral, resp. rate 18, height 5\' 1"  (1.549 m), weight 83.5 kg (184 lb), last menstrual period 01/21/2016, SpO2 100 %, unknown if currently breastfeeding. Body mass index is 34.77 kg/m. General: Well developed, well nourished, in no acute distress. Head eyes ears nose throat: Normocephalic, atraumatic, sclera non-icteric, no xanthomas, nares are without discharge. No apparent  thyromegaly and/or mass  Lungs: Normal respiratory effort.  no wheezes, no rales, no rhonchi.  Heart: RRR with normal S1 S2. 2+ right upper sternal border murmur gallop, no rub, PMI is normal size and placement, carotid upstroke normal without bruit, jugular venous pressure is normal Abdomen: Soft, non-tender, non-distended with normoactive bowel sounds. No hepatomegaly. No rebound/guarding. No obvious abdominal masses. Abdominal aorta is normal size without bruit Extremities: No edema. no cyanosis, no clubbing, no ulcers  Peripheral : 2+ bilateral upper extremity pulses, 2+ bilateral femoral pulses, 2+ bilateral dorsal pedal pulse Neuro: Alert and oriented. No facial asymmetry. No focal deficit. Moves all extremities spontaneously. Musculoskeletal: Normal muscle tone without kyphosis Psych:  Responds to questions appropriately with a normal affect.    Assessment: 46 year old female with chronic kidney disease essential hypertension mixed hyperlipidemia and apparent peripheral vascular disease on appropriate antihypertensive medication management with diffuse malaise and chest discomfort from without evidence of acute coronary syndrome  Plan: 1. Begin ambulate and follow for improvements of symptoms 2. Continue current medication management for hypertension control without change 3. Further consideration of the outpatient evaluation of recurrence of discomfort occurs 4. No further cardiac diagnostics at this time necessary 5. If ambulating well okay for discharge home with follow-up in one to 2 weeks  Signed, Corey Skains M.D. [facility] 08/29/2016, 8:17 AM

## 2016-08-29 NOTE — Progress Notes (Signed)
Pt. Arrived to unit via stretcher, transferred to bed with standby assist. NS running at 125. Skin assessed with Wyona Almas, RN. Skin warm and dry with no issues observed. Tele placed and verified with CCMD with Wyona Almas, RN. Pt. Running NSR. General room orientation given.  Instruction on how to use  ascom and call bell system given. Fall risk contract signed and placed on wall. Yellow fall risk bracelet and red allergy bracelet placed. Husband at bedside. Pt. Given sandwich tray per request. VSS. Pt. Refused some of her HS medications stating she'll take them in morning. Bed in lowest position with bed alarm on.

## 2016-08-29 NOTE — Progress Notes (Signed)
Dr. Marcille Blanco notified of K+ 3.3, no new orders at this time.

## 2016-08-30 ENCOUNTER — Inpatient Hospital Stay
Admit: 2016-08-30 | Discharge: 2016-08-30 | Disposition: A | Discharge status: Home / Self Care | Payer: Medicaid Other | Attending: Internal Medicine | Admitting: Internal Medicine

## 2016-08-30 LAB — ECHOCARDIOGRAM COMPLETE
HEIGHTINCHES: 61 in
Weight: 2944 oz

## 2016-08-30 MED ORDER — AMLODIPINE BESYLATE 10 MG PO TABS: 10.0000 mg | ORAL_TABLET | Freq: Every day | ORAL | 2 refills | Status: DC

## 2016-08-30 MED ORDER — CLONIDINE HCL 0.3 MG PO TABS: 0.3000 mg | ORAL_TABLET | Freq: Three times a day (TID) | ORAL | 11 refills | Status: DC

## 2016-08-30 MED ORDER — ASPIRIN EC 81 MG PO TBEC: 81.0000 mg | DELAYED_RELEASE_TABLET | Freq: Every day | ORAL | 0 refills | Status: DC

## 2016-08-30 MED ORDER — FUROSEMIDE 40 MG PO TABS: 40.0000 mg | ORAL_TABLET | Freq: Two times a day (BID) | ORAL | 1 refills | Status: DC

## 2016-08-30 NOTE — Progress Notes (Signed)
Pt asleep.

## 2016-08-30 NOTE — Progress Notes (Signed)
[facility]  ROUNDING NOTE   Subjective:  Patient reports feeling improved today. She still has some lower extremity edema however. Appears to be in good spirits.   Objective:  Vital signs in last 24 hours:  Temp:  [97.9 F (36.6 C)-98.7 F (37.1 C)] 97.9 F (36.6 C) (08/27 0421) Pulse Rate:  [56-66] 56 (08/27 0421) Resp:  [18] 18 (08/26 2007) BP: (132-142)/(57-60) 142/57 (08/27 0421) SpO2:  [96 %-100 %] 96 % (08/27 0729)  Weight change:  Filed Weights   08/28/16 1902  Weight: 83.5 kg (184 lb)    Intake/Output: I/O last 3 completed shifts: In: 1016.3 [P.O.:480; I.V.:536.3] Out: 1300 [Urine:1300]   Intake/Output this shift:  No intake/output data recorded.  Physical Exam: General: No acute distress  Head: Normocephalic, atraumatic. Moist oral mucosal membranes  Eyes: Anicteric  Neck: Supple, trachea midline  Lungs:  Clear to auscultation, normal effort  Heart: S1S2 no rubs  Abdomen:  Soft, nontender, bowel sounds present  Extremities: 2+ peripheral edema.  Neurologic: Awake, alert, following commands  Skin: No lesions       Basic Metabolic Panel:  Recent Labs Lab 08/28/16 1912 08/29/16 0129  NA 138 140  K 3.4* 3.3*  CL 112* 114*  CO2 20* 21*  GLUCOSE 94 98  BUN 23* 25*  CREATININE 2.45* 2.58*  CALCIUM 8.3* 8.0*    Liver Function Tests:  Recent Labs Lab 08/28/16 1912  AST 13*  ALT 12*  ALKPHOS 75  BILITOT 0.3  PROT 5.8*  ALBUMIN 2.9*   No results for input(s): LIPASE, AMYLASE in the last 168 hours. No results for input(s): AMMONIA in the last 168 hours.  CBC:  Recent Labs Lab 08/28/16 1912 08/29/16 0129  WBC 7.6 6.5  HGB 9.6* 8.2*  HCT 28.7* 24.8*  MCV 95.5 94.7  PLT 137* 122*    Cardiac Enzymes:  Recent Labs Lab 08/28/16 1912 08/29/16 0129 08/29/16 0714 08/29/16 1316  TROPONINI 0.25* 0.29* 0.34* 0.30*    BNP: Invalid input(s): POCBNP  CBG: No results for input(s): GLUCAP in the last 168  hours.  Microbiology: Results for orders placed or performed during the hospital encounter of 06/25/14  C difficile quick scan w PCR reflex ([facility] only)     Status: None   Collection Time: 06/25/14  5:15 AM  Result Value Ref Range Status   C Diff antigen NEGATIVE  Final   C Diff toxin NEGATIVE  Final   C Diff interpretation Negative for C. difficile  Final  Stool culture     Status: None   Collection Time: 06/25/14 10:34 PM  Result Value Ref Range Status   Specimen Description STOOL  Final   Special Requests Normal  Final   Culture   Final    NO SALMONELLA OR SHIGELLA ISOLATED No Pathogenic E. coli detected NO CAMPYLOBACTER DETECTED REDUCED NORMAL FECAL FLORA    Report Status 06/27/2014 FINAL  Final    Coagulation Studies: No results for input(s): LABPROT, INR in the last 72 hours.  Urinalysis: No results for input(s): COLORURINE, LABSPEC, PHURINE, GLUCOSEU, HGBUR, BILIRUBINUR, KETONESUR, PROTEINUR, UROBILINOGEN, NITRITE, LEUKOCYTESUR in the last 72 hours.  Invalid input(s): APPERANCEUR    Imaging: Dg Chest 2 View  Result Date: 08/28/2016 CLINICAL DATA:  Substernal chest pain EXAM: CHEST  2 VIEW COMPARISON:  02/07/2015 FINDINGS: Cardiac shadow is within normal limits. The lungs are well aerated bilaterally. Minimal atelectatic changes are noted the lingula. No pleural effusion is noted. No bony abnormality is noted. IMPRESSION: Mild  lingular atelectasis. Electronically Signed   By: Inez Catalina M.D.   On: 08/28/2016 19:36     Medications:    . amLODipine  10 mg Oral Daily  . aspirin EC  325 mg Oral Daily  . beclomethasone  1 puff Inhalation BID  . budesonide (PULMICORT) nebulizer solution  0.25 mg Nebulization BID  . carvedilol  37.5 mg Oral BID  . cloNIDine  0.3 mg Oral TID  . famotidine  10 mg Oral Daily  . fluticasone  1 spray Each Nare BID  . furosemide  40 mg Oral BID  . heparin  5,000 Units Subcutaneous Q8H  . loratadine  10 mg Oral Daily  . pantoprazole  40  mg Oral Daily   acetaminophen **OR** acetaminophen, albuterol, loperamide, metoprolol tartrate, ondansetron **OR** ondansetron (ZOFRAN) IV  Assessment/ Plan:  46 y.o. female  with Poorly controlled hypertension, peripheral vascular disease, stroke 2, irregular heartbeat, C-section July 2018, chronic kidney disease, coronary disease, was admitted on 08/28/2016 with chest pain, poorly controlled hypertension and edema   1. Acute renal failure on chronic kidney disease stage III Baseline creatinine 1.5-1.7 from June/July 2018. GFR 36 to 41.  Admission creatinine is 2.45. Acute kidney injury is likely secondary to IV contrast exposure. Other differential includes injury from malignant hypertension.  Underlying chronic kidney disease is likely secondary to atherosclerosis and hypertension   2. Severe hypertension with lower extremity edema.  Plan:  Blood pressure appears to be better today at 142/57.  For now we recommended the patient be continued on amlodipine, carvedilol, and clonidine. She will also continue Lasix 40 mg by mouth twice a day. She will need follow-up with [place] in the office given her ongoing edema and renal insufficiency. Patient to follow-up with Dr. Candiss Norse in the office in one to 2 weeks. She verbalizes importance of following up given her aortic insufficiency.     LOS: 2 Intisar Claudio 8/27/201811:32 AM

## 2016-08-30 NOTE — Progress Notes (Signed)
*  PRELIMINARY RESULTS* Echocardiogram 2D Echocardiogram has been performed.  Gabrielle Schultz 08/30/2016, 10:17 AM

## 2016-08-30 NOTE — Discharge Instructions (Signed)

## 2016-08-30 NOTE — Discharge Summary (Signed)
[facility] [place] NAME: Gabrielle Schultz    MR#:  [id number]  DATE OF BIRTH:  [birth year]  DATE OF ADMISSION:  08/28/2016 ADMITTING PHYSICIAN: Dustin Flock, MD  DATE OF DISCHARGE: 08/30/2016  PRIMARY CARE PHYSICIAN: Boyce Medici, FNP    ADMISSION DIAGNOSIS:  Elevated serum creatinine [R79.89] Elevated troponin [R74.8] Mild intermittent asthma with acute exacerbation [J45.21] Chest pain, unspecified type [R07.9] Hypertension, unspecified type [I10]  DISCHARGE DIAGNOSIS:  Malignant HTN CKD-III  SECONDARY DIAGNOSIS:   Past Medical History:  Diagnosis Date  . Acid reflux   . Allergic rhinitis 06/26/2015  . Allergy   . Asthma   . Carpal tunnel syndrome    right hand  . Chronic kidney disease   . Colitis   . CVA (cerebral infarction)    2009, 2015  . History of syncope   . Hypertension 2008  . Low HDL (under 40) 06/26/2015  . MI (myocardial infarction) ([place])    2015 - patient wasnt told by cardiologist that she had MI but has had cardiac cath in past  . Migraines   . Poor circulation   . Renal insufficiency   . Stroke ([place])   . Syncope and collapse     HOSPITAL COURSE:  46 year old female with h/o Chronic HTN, CKD-III who is status post recent C-section in July 2018 came in  with chest pain  1. Chest pain could be related to accelerated hypertension sHe reports having a cardiac catheterization in 2014 which she was told was minimally blockage cont aspirin Cycle cardiac enzyme 0.25--0.29 Cardiology consult appreciated. No cardiac diagnostics ordered.  2. Accelerated blood pressure -pt refused her hydralazine (gives headaches---d/ced) -Continue Coreg, amlodipine, clonidine and lasix (added by nephrology)  3.  chronic kidney disease stage III -Nephrology input appreciated -pt used to go to [facility] nephro but now does not. -pt to follow with Dr Candiss Norse  4. Asthma continue inhalers as taking at home  5.  Miscellaneous Lovenox for DVT prophylaxis -overall better. D/c home. Pt agreeable  CONSULTS OBTAINED:  Treatment Team:  Corey Skains, MD Murlean Iba, MD  DRUG ALLERGIES:   Allergies  Allergen Reactions  . Coconut Oil Swelling  . Amoxicillin Rash  . Penicillins Itching and Other (See Comments)    Has patient had a PCN reaction causing immediate rash, facial/tongue/throat swelling, SOB or lightheadedness with hypotension: No Has patient had a PCN reaction causing severe rash involving mucus membranes or skin necrosis: No Has patient had a PCN reaction that required hospitalization: No Has patient had a PCN reaction occurring within the last 10 years: Yes If all of the above answers are "NO", then may proceed with Cephalosporin use.     DISCHARGE MEDICATIONS:   Current Discharge Medication List    START taking these medications   Details  aspirin EC 81 MG tablet Take 1 tablet (81 mg total) by mouth daily. Qty: 30 tablet, Refills: 0    cloNIDine (CATAPRES) 0.3 MG tablet Take 1 tablet (0.3 mg total) by mouth 3 (three) times daily. Qty: 60 tablet, Refills: 11    furosemide (LASIX) 40 MG tablet Take 1 tablet (40 mg total) by mouth 2 (two) times daily. Qty: 30 tablet, Refills: 1      CONTINUE these medications which have CHANGED   Details  amLODipine (NORVASC) 10 MG tablet Take 1 tablet (10 mg total) by mouth daily. Qty: 30 tablet, Refills: 2      CONTINUE these medications which  have NOT CHANGED   Details  carvedilol (COREG) 12.5 MG tablet Take 37.5 mg by mouth 2 (two) times daily.     enoxaparin (LOVENOX) 40 MG/0.4ML injection Inject 40 mg into the skin daily.    fluticasone (FLONASE) 50 MCG/ACT nasal spray Place 1 spray into both nostrils 2 (two) times daily. Qty: 16 g, Refills: 11    Mometasone Furoate 110 MCG/INH AEPB Inhale 2 puffs into the lungs 2 (two) times daily. Qty: 1 Inhaler, Refills: 3    omeprazole (PRILOSEC) 20 MG capsule Take 1 capsule (20 mg  total) by mouth daily. Qty: 90 capsule, Refills: 0    polyethylene glycol (MIRALAX / GLYCOLAX) packet Take 17 g by mouth daily.    VENTOLIN HFA 108 (90 Base) MCG/ACT inhaler INHALE 2 PUFFS EVERY FOUR HOURS AS NEEDED FOR COUGH AND WHEEZING. Qty: 54 g, Refills: 0      STOP taking these medications     hydrALAZINE (APRESOLINE) 100 MG tablet      isosorbide dinitrate (ISORDIL) 20 MG tablet      cetirizine (ZYRTEC) 10 MG tablet      loperamide (IMODIUM) 2 MG capsule      ranitidine (ZANTAC) 150 MG tablet         If you experience worsening of your admission symptoms, develop shortness of breath, life threatening emergency, suicidal or homicidal thoughts you must seek medical attention immediately by calling 911 or calling your MD immediately  if symptoms less severe.  You Must read complete instructions/literature along with all the possible adverse reactions/side effects for all the Medicines you take and that have been prescribed to you. Take any new Medicines after you have completely understood and accept all the possible adverse reactions/side effects.   Please note  You were cared for by a hospitalist during your hospital stay. If you have any questions about your discharge medications or the care you received while you were in the hospital after you are discharged, you can call the unit and asked to speak with the hospitalist on call if the hospitalist that took care of you is not available. Once you are discharged, your primary care physician will handle any further medical issues. Please note that NO REFILLS for any discharge medications will be authorized once you are discharged, as it is imperative that you return to your primary care physician (or establish a relationship with a primary care physician if you do not have one) for your aftercare needs so that they can reassess your need for medications and monitor your lab values. Today   SUBJECTIVE   I feels much  better  VITAL SIGNS:  Blood pressure (!) 142/57, pulse (!) 56, temperature 97.9 F (36.6 C), temperature source Oral, resp. rate 18, height 5\' 1"  (1.549 m), weight 83.5 kg (184 lb), last menstrual period 01/21/2016, SpO2 96 %, unknown if currently breastfeeding.  I/O:    Intake/Output Summary (Last 24 hours) at 08/30/16 0930 Last data filed at 08/30/16 0421  Gross per 24 hour  Intake              480 ml  Output             1300 ml  Net             -820 ml    PHYSICAL EXAMINATION:  GENERAL:  46 y.o.-year-old patient lying in the bed with no acute distress.  EYES: Pupils equal, round, reactive to light and accommodation. No scleral icterus. Extraocular muscles intact.  HEENT: Head atraumatic, normocephalic. Oropharynx and nasopharynx clear.  NECK:  Supple, no jugular venous distention. No thyroid enlargement, no tenderness.  LUNGS: Normal breath sounds bilaterally, no wheezing, rales,rhonchi or crepitation. No use of accessory muscles of respiration.  CARDIOVASCULAR: S1, S2 normal. No murmurs, rubs, or gallops.  ABDOMEN: Soft, non-tender, non-distended. Bowel sounds present. No organomegaly or mass.  EXTREMITIES: No pedal edema, cyanosis, or clubbing.  NEUROLOGIC: Cranial nerves II through XII are intact. Muscle strength 5/5 in all extremities. Sensation intact. Gait not checked.  PSYCHIATRIC: The patient is alert and oriented x 3.  SKIN: No obvious rash, lesion, or ulcer.   DATA REVIEW:   CBC   Recent Labs Lab 08/29/16 0129  WBC 6.5  HGB 8.2*  HCT 24.8*  PLT 122*    Chemistries   Recent Labs Lab 08/28/16 1912 08/29/16 0129  NA 138 140  K 3.4* 3.3*  CL 112* 114*  CO2 20* 21*  GLUCOSE 94 98  BUN 23* 25*  CREATININE 2.45* 2.58*  CALCIUM 8.3* 8.0*  AST 13*  --   ALT 12*  --   ALKPHOS 75  --   BILITOT 0.3  --     Microbiology Results   No results found for this or any previous visit (from the past 240 hour(s)).  RADIOLOGY:  Dg Chest 2 View  Result Date:  08/28/2016 CLINICAL DATA:  Substernal chest pain EXAM: CHEST  2 VIEW COMPARISON:  02/07/2015 FINDINGS: Cardiac shadow is within normal limits. The lungs are well aerated bilaterally. Minimal atelectatic changes are noted the lingula. No pleural effusion is noted. No bony abnormality is noted. IMPRESSION: Mild lingular atelectasis. Electronically Signed   By: Inez Catalina M.D.   On: 08/28/2016 19:36     Management plans discussed with the patient, family and they are in agreement.  CODE STATUS:     Code Status Orders        Start     Ordered   08/28/16 2246  Full code  Continuous     08/28/16 2245    Code Status History    Date Active Date Inactive Code Status Order ID Comments User Context   06/25/2014 11:55 AM 06/27/2014  2:01 PM Full Code [id number]  Henreitta Leber, MD Inpatient      TOTAL TIME TAKING CARE OF THIS PATIENT: 40 minutes.    Traevion Poehler M.D on 08/30/2016 at 9:30 AM  Between 7am to 6pm - Pager - [phone] After 6pm go to [email or web address] - password [email or web address] [facility]  Office  [phone]  CC: Primary care physician; Boyce Medici, FNP

## 2016-08-31 ENCOUNTER — Telehealth: Payer: Self-pay

## 2016-08-31 NOTE — Telephone Encounter (Signed)
Placed signed application/script in [facility] folder for pickup. 

## 2016-08-31 NOTE — Telephone Encounter (Signed)
Received PAP application from [facility] for Asmanex placed for provider to sign.

## 2016-09-08 DIAGNOSIS — I351 Nonrheumatic aortic (valve) insufficiency: Secondary | ICD-10-CM | POA: Insufficient documentation

## 2016-09-09 ENCOUNTER — Ambulatory Visit: Payer: Self-pay | Admitting: Adult Health Nurse Practitioner

## 2016-09-09 VITALS — BP 129/64 | Temp 98.6°F | Resp 17 | Ht 61.0 in | Wt 185.1 lb

## 2016-09-09 DIAGNOSIS — I10 Essential (primary) hypertension: Secondary | ICD-10-CM

## 2016-09-09 NOTE — Progress Notes (Signed)
Subjective:    Patient ID: Gabrielle Schultz, female    DOB: 05-15-1970, 46 y.o.   MRN: [id number]  HPI   Pt is here for f/u from hospitalization on 08/28/16 for chest pain. Dx: Chest pain secondary to elevated BP. Pt is postpartum - breastfeeding.  Pt reports she has quit smoking.  Unable to tolerate hydralizine due to headaches. Currently doing well on BP medications including recent addition of lasix.   Pt can't get Asmanex from [facility]. Unable to tolerate QVAR Pt reports SOB during the night 2-3x week.      Patient Active Problem List   Diagnosis Date Noted  . Chest pain 08/28/2016  . Edema of both feet 04/22/2016  . Abnormal TSH 04/22/2016  . Abdominal pain, right upper quadrant 12/02/2015  . Dermatitis 12/02/2015  . Urinary tract infection 10/28/2015  . Low HDL (under 40) 06/26/2015  . Allergic rhinitis 06/26/2015  . Tobacco abuse 06/26/2015  . Cerebral infarct ([place]) 11/21/2013  . Chronic kidney disease (CKD), stage III (moderate) 11/21/2013  . Hypertension 08/12/2011  . LVH (left ventricular hypertrophy) 08/12/2011  . Chronic renal insufficiency 08/12/2011   Allergies as of 09/09/2016      Reactions   Coconut Oil Swelling   Amoxicillin Rash   Penicillins Itching, Other (See Comments)   Has patient had a PCN reaction causing immediate rash, facial/tongue/throat swelling, SOB or lightheadedness with hypotension: No Has patient had a PCN reaction causing severe rash involving mucus membranes or skin necrosis: No Has patient had a PCN reaction that required hospitalization: No Has patient had a PCN reaction occurring within the last 10 years: Yes If all of the above answers are "NO", then may proceed with Cephalosporin use.      Medication List       Accurate as of 09/09/16  6:45 PM. Always use your most recent med list.          amLODipine 10 MG tablet Commonly known as:  NORVASC Take 1 tablet (10 mg total) by mouth daily.   aspirin EC 81 MG tablet Take 1  tablet (81 mg total) by mouth daily.   carvedilol 12.5 MG tablet Commonly known as:  COREG Take 37.5 mg by mouth 2 (two) times daily.   cloNIDine 0.3 MG tablet Commonly known as:  CATAPRES Take 1 tablet (0.3 mg total) by mouth 3 (three) times daily.   enoxaparin 40 MG/0.4ML injection Commonly known as:  LOVENOX Inject 40 mg into the skin daily.   fluticasone 50 MCG/ACT nasal spray Commonly known as:  FLONASE Place 1 spray into both nostrils 2 (two) times daily.   furosemide 40 MG tablet Commonly known as:  LASIX Take 1 tablet (40 mg total) by mouth 2 (two) times daily.   Mometasone Furoate 110 MCG/INH Aepb Inhale 2 puffs into the lungs 2 (two) times daily.   omeprazole 20 MG capsule Commonly known as:  PRILOSEC Take 1 capsule (20 mg total) by mouth daily.   polyethylene glycol packet Commonly known as:  MIRALAX / GLYCOLAX Take 17 g by mouth daily.   VENTOLIN HFA 108 (90 Base) MCG/ACT inhaler Generic drug:  albuterol INHALE 2 PUFFS EVERY FOUR HOURS AS NEEDED FOR COUGH AND WHEEZING.        Review of Systems  All other systems reviewed and are negative.    Objective:   Physical Exam  Constitutional: She appears well-developed and well-nourished.  Cardiovascular: Normal rate, regular rhythm and normal heart sounds.   Pulmonary/Chest: Effort  normal and breath sounds normal.  Abdominal: Soft. Bowel sounds are normal.  Musculoskeletal: She exhibits edema.  Skin: Skin is warm and dry.    BP 129/64 (BP Location: Right Arm, Patient Position: Sitting, Cuff Size: Normal)   Temp 98.6 F (37 C)   Resp 17   Ht 5\' 1"  (1.549 m)   Wt 185 lb 1.6 oz (84 kg)   LMP 01/21/2016 (LMP Unknown)   Breastfeeding? Yes   BMI 34.97 kg/m        Assessment & Plan:   HTN:  Controlled.  Goal BP <140/90.  Continue current medication regimen.  Encourage low salt diet and exercise.    Pt saw Dr. Candiss Norse - nephrology in hospital and needs to reschedule appt.  Pt has scheduled  appt with cardio Pending to start Advair. Unknown if transfers into breast milk. Will continue Ventolin.  F/u in 4 mo - pending pt receives Medicaid

## 2016-09-13 ENCOUNTER — Ambulatory Visit: Payer: Self-pay | Admitting: Pharmacist

## 2016-09-13 DIAGNOSIS — Z79899 Other long term (current) drug therapy: Secondary | ICD-10-CM

## 2016-09-13 NOTE — Patient Instructions (Signed)
Ask [facility] about Asmanex and Ventolin during breastfeeding.  Ask OB about using Pulmicort Flexhaler if Asmanex is not available.  Ask OB about using Zyrtec or Claritin for allergies.

## 2016-09-13 NOTE — Progress Notes (Signed)
[facility] Visit Note  Patient: Gabrielle Schultz MRN: [id number] Date of Birth: 1970/10/21 PCP: Boyce Medici, FNP   Gabrielle Schultz 46 y.o. female presents for an annual medication therapy review visit today with the pharmacist.   LMP 01/21/2016 (LMP Unknown)   Patient Information   Past Medical History:  Diagnosis Date  . Acid reflux   . Allergic rhinitis 06/26/2015  . Allergy   . Asthma   . Carpal tunnel syndrome    right hand  . Chronic kidney disease   . Colitis   . CVA (cerebral infarction)    2009, 2015  . History of syncope   . Hypertension 2008  . Low HDL (under 40) 06/26/2015  . MI (myocardial infarction) ([place])    2015 - patient wasnt told by cardiologist that she had MI but has had cardiac cath in past  . Migraines   . Poor circulation   . Renal insufficiency   . Stroke ([place])   . Syncope and collapse       Past Surgical History:  Procedure Laterality Date  . CARDIAC CATHETERIZATION    . CESAREAN SECTION  1994   Dr. Ammie Dalton  . COLONOSCOPY WITH PROPOFOL N/A 06/26/2014   Procedure: COLONOSCOPY WITH PROPOFOL;  Surgeon: Josefine Class, MD;  [facility] END[facility]COPY;  Service: Endoscopy;  Laterality: N/A;  . DILATION AND CURETTAGE OF UTERUS  x2 for incomplete SABs     Family History  Problem Relation Age of Onset  . Hyperlipidemia Mother   . Heart disease Mother        has a defibrillator  . COPD Mother   . Hypertension Father   . Heart disease Father   . Hyperlipidemia Father   . Hypertension Sister        died in her 79s from heart failure and end stage kidney disease  . Kidney disease Sister   . Hypertension Brother   . Heart disease Brother        has a defibrillator  . Coronary artery disease Brother        had CABG    New Diagnoses (since last visit):   Family Support: Good           History  Alcohol Use  . Yes    Comment: "seldom" - drinks 350-727mL when she does drink      History  Smoking Status  .  Former Smoker  . Packs/day: 0.25  . Years: 18.00  . Types: Cigarettes  . Quit date: 08/01/2016  Smokeless Tobacco  . Never Used      Health Maintenance  Topic Date Due  . PNEUMOCOCCAL POLYSACCHARIDE VACCINE (1) 11/17/1974  . FOOT EXAM  11/17/1982  . OPHTHALMOLOGY EXAM  11/17/1982  . HIV Screening  11/17/1987  . TETANUS/TDAP  11/17/1991  . PAP SMEAR  11/16/1993  . URINE MICROALBUMIN  07/30/2016  . INFLUENZA VACCINE  08/04/2016  . HEMOGLOBIN A1C  09/04/2016    Prior to Admission medications   Medication Sig Start Date End Date Taking? Authorizing Provider  amLODipine (NORVASC) 10 MG tablet Take 1 tablet (10 mg total) by mouth daily. 08/31/16  Yes Fritzi Mandes, MD  aspirin EC 81 MG tablet Take 1 tablet (81 mg total) by mouth daily. 08/31/16  Yes Fritzi Mandes, MD  carvedilol (COREG) 12.5 MG tablet Take 37.5 mg by mouth 2 (two) times daily.  08/06/16 10/05/16 Yes Haberman, Dareen Piano, NP  cloNIDine (CATAPRES) 0.3 MG tablet Take 1 tablet (  0.3 mg total) by mouth 3 (three) times daily. 08/30/16  Yes Fritzi Mandes, MD  fluticasone (FLONASE) 50 MCG/ACT nasal spray Place 1 spray into both nostrils 2 (two) times daily. 06/26/15  Yes Lada, Satira Anis, MD  furosemide (LASIX) 40 MG tablet Take 1 tablet (40 mg total) by mouth 2 (two) times daily. 08/30/16  Yes Fritzi Mandes, MD  omeprazole (PRILOSEC) 20 MG capsule Take 1 capsule (20 mg total) by mouth daily. 03/11/16  Yes McGowan, Larene Beach A, PA-C  polyethylene glycol (MIRALAX / GLYCOLAX) packet Take 17 g by mouth daily.   Yes [provider]  VENTOLIN HFA 108 (90 Base) MCG/ACT inhaler INHALE 2 PUFFS EVERY FOUR HOURS AS NEEDED FOR COUGH AND WHEEZING. 12/16/15  Yes McGowan, Larene Beach A, PA-C  Mometasone Furoate 110 MCG/INH AEPB Inhale 2 puffs into the lungs 2 (two) times daily. Patient not taking: Reported on 09/13/2016 08/26/16 10/25/16  Valerie Roys, DO    Assessment and Plan:  Post Partum:  Recently had a baby in July, delivered at 30 weeks. She  is using donated breast milk until her milk comes in enough to pump. Per patient, she was told that her current medications were "safe" for breastfeeding. I encouraged her to discuss this with her OB ([facility]) at the next visit. She inquired about the safety of loratadine, cetrizine and Asmanex during breastfeeding. Per Up-To[facility]Date, all three appear to be minimal risk; patient was encouraged to discuss with her OB.  Medication Access and Compliance: Patient is here today to reconcile her medications. She did not bring her medication bottles, but was able to confirm or deny each medication according to her list in [facility]. [facility] has been providing her access to all of her medications with the exception of Asmanex. She recently received Medicaid and will be transferring her medications to a local pharmacy. She has not been using a pill box and admits to forgetting to take some of her medications. Patient was provided with a weekly pill box to use at home and one to use when she goes out.   Asthma:  Using Ventolin HFA. Patient was on QVAR but stopped taking this; she prefers Asmanex. Discussed an alternative such as Pulmicort as the Asmanex 110 mcg appears to be difficult to obtain. Patient will discuss with the OB.  Chronic Kidney Disease:  Stage III per [facility] notes. Patient to follow up with Dr. Candiss Norse.  Cardiac:  Patient to follow up with Dr. Nehemiah Massed. Hx of hypertension, poor circulation, strokes (2009, 2015), and MI. Currently taking amlodipine, carvedilol, clonidine, furosemide and aspirin. Patient does not tolerate hydralazine due to headaches. Blood pressure was 129/64 mmHg on 09/09/16 at [facility].  Primary Care:  Patient is looking for a primary care provider. Once she receives her Medicaid card, she can work on finding a provider with her case Freight forwarder. She is no longer eligible for services through [facility] or [facility].  Patient was  encouraged to follow up with [facility] as needed for medication reconciliation.   Jacklyne Baik K. Dicky Doe, PharmD [facility] [employer] Operations Coordinator [phone]

## 2016-10-14 ENCOUNTER — Ambulatory Visit (INDEPENDENT_AMBULATORY_CARE_PROVIDER_SITE_OTHER): Payer: Medicaid Other | Admitting: Obstetrics and Gynecology

## 2016-10-14 ENCOUNTER — Encounter: Payer: Self-pay | Admitting: Obstetrics and Gynecology

## 2016-10-14 VITALS — BP 112/60 | Ht 61.0 in | Wt 170.0 lb

## 2016-10-14 DIAGNOSIS — B354 Tinea corporis: Secondary | ICD-10-CM

## 2016-10-14 MED ORDER — FLUCONAZOLE 150 MG PO TABS: 150.0000 mg | ORAL_TABLET | ORAL | 0 refills | Status: AC

## 2016-10-14 MED ORDER — NYSTATIN 100000 UNIT/GM EX POWD: Freq: Three times a day (TID) | CUTANEOUS | 1 refills | Status: DC

## 2016-10-14 NOTE — Progress Notes (Signed)
Obstetrics & Gynecology Office Visit   Chief Complaint:  Chief Complaint  Patient presents with  . Postpartum Care    C/S in July at [facility] incision raw/looks opened    History of Present Illness: 46 y.o. female s/p repeat cesarean section at [facility] on 07/23/2016 presenting with incisional complaints.  She has noted continued irritation and burning at her incision site.  Has attempted to keep the incision dry, has not used any new soaps or detergents.  There are associated skin changes of redness.  She describes the pain as 6/10 as far as pain from burning.  No discharge, no fevers, no chills.   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 Acid reflux   . Allergic rhinitis 06/26/2015  . Allergy   . Asthma   . Carpal tunnel syndrome    right hand  . Chronic kidney disease   . Colitis   . CVA (cerebral infarction)    2009, 2015  . History of syncope   . Hypertension 2008  . Low HDL (under 40) 06/26/2015  . MI (myocardial infarction) ([place])    2015 - patient wasnt told by cardiologist that she had MI but has had cardiac cath in past  . Migraines   . Poor circulation   . Renal insufficiency   . Stroke ([place])   . Syncope and collapse     Past Surgical History:  Past Surgical History:  Procedure Laterality Date  . CARDIAC CATHETERIZATION    . CESAREAN SECTION  1994   Dr. Ammie Dalton  . COLONOSCOPY WITH PROPOFOL N/A 06/26/2014   Procedure: COLONOSCOPY WITH PROPOFOL;  Surgeon: Josefine Class, MD;  Location: [facility] ENDOSCOPY;  Service: Endoscopy;  Laterality: N/A;  . DILATION AND CURETTAGE OF UTERUS  x2 for incomplete SABs    Gynecologic History: No LMP recorded.  Obstetric History:  [id number]  Family History:  Family History  Problem Relation Age of Onset  . Hyperlipidemia Mother   . Heart disease Mother        has a defibrillator  . COPD Mother   . Hypertension Father   . Heart disease Father   . Hyperlipidemia Father   . Hypertension Sister        died in her 85s from heart failure and end stage kidney disease  . Kidney disease Sister   . Hypertension Brother   . Heart disease Brother        has a defibrillator  . Coronary artery disease Brother        had CABG    Social History:  Social History   Social History  . Marital status: Married    Spouse name: N/A  . Number of children: 1  . Years of education: N/A   Occupational History  . Not on file.   Social History Main Topics  . Smoking status: Former Smoker    Packs/day: 0.25    Years: 18.00    Types: Cigarettes    Quit date: 08/01/2016  . Smokeless tobacco: Never Used  . Alcohol use Yes     Comment: "seldom" - drinks 350-721mL when she does drink  . Drug use: No  . Sexual activity: Yes  Partners: Male    Birth control/ protection: None   Other Topics Concern  . Not on file   Social History Narrative  . No narrative on file    Allergies:  Allergies  Allergen Reactions  . Coconut Oil Swelling  . Amoxicillin Rash  . Penicillins Itching and Other (See Comments)    Has patient had a PCN reaction causing immediate rash, facial/tongue/throat swelling, SOB or lightheadedness with hypotension: No Has patient had a PCN reaction causing severe rash involving mucus membranes or skin necrosis: No Has patient had a PCN reaction that required hospitalization: No Has patient had a PCN reaction occurring within the last 10 years: Yes If all of the above answers are "NO", then may proceed with Cephalosporin use.     Medications: Prior to Admission medications   Medication Sig Start Date End Date Taking? Authorizing Provider  amLODipine (NORVASC) 10 MG tablet Take 1 tablet (10 mg total) by  mouth daily. 08/31/16  Yes Fritzi Mandes, MD  aspirin EC 81 MG tablet Take 1 tablet (81 mg total) by mouth daily. 08/31/16  Yes Fritzi Mandes, MD  cloNIDine (CATAPRES) 0.3 MG tablet Take 1 tablet (0.3 mg total) by mouth 3 (three) times daily. 08/30/16  Yes Fritzi Mandes, MD  fluticasone (FLONASE) 50 MCG/ACT nasal spray Place 1 spray into both nostrils 2 (two) times daily. 06/26/15  Yes Lada, Satira Anis, MD  furosemide (LASIX) 40 MG tablet Take 1 tablet (40 mg total) by mouth 2 (two) times daily. 08/30/16  Yes Fritzi Mandes, MD  omeprazole (PRILOSEC) 20 MG capsule Take 1 capsule (20 mg total) by mouth daily. 03/11/16  Yes McGowan, Larene Beach A, PA-C  polyethylene glycol (MIRALAX / GLYCOLAX) packet Take 17 g by mouth daily.   Yes [provider]  VENTOLIN HFA 108 (90 Base) MCG/ACT inhaler INHALE 2 PUFFS EVERY FOUR HOURS AS NEEDED FOR COUGH AND WHEEZING. 12/16/15  Yes McGowan, Larene Beach A, PA-C  carvedilol (COREG) 12.5 MG tablet Take 37.5 mg by mouth 2 (two) times daily.  08/06/16 10/05/16  Charlotte Crumb, NP  fluconazole (DIFLUCAN) 150 MG tablet Take 1 tablet (150 mg total) by mouth once a week. 10/14/16 10/22/16  Malachy Mood, MD  Mometasone Furoate 110 MCG/INH AEPB Inhale 2 puffs into the lungs 2 (two) times daily. Patient not taking: Reported on 09/13/2016 08/26/16 10/25/16  Park Liter P, DO  nystatin (MYCOSTATIN/NYSTOP) powder Apply topically 3 (three) times daily. 10/14/16   Malachy Mood, MD    Physical Exam Blood pressure 112/60, height 5\' 1"  (1.549 m), weight 170 lb (77.1 kg), not currently breastfeeding.  General: NAD HEENT: normocephalic, anicteric Thyroid: no enlargement, no palpable nodules Pulmonary: No increased work of breathing Abdomen: NABS, soft, non-tender, non-distended.  Umbilicus without lesions.  No hepatomegaly, splenomegaly or masses palpable. No evidence of hernia.  The incision is intact, there appear to be a small superfical skin break in the incisions right  aspect, this does not have any appreciable depth or associated discharge.  There is profound erythema surrounding the incision, the borders of the rash as sharply demarcated and red, the central prot ion of the incision has hypopigmented white plaques. Neurologic: Grossly intact Psychiatric: mood appropriate, affect full  Physical Exam  Abdominal:       Assessment: 46 y.o. [id number] with severe tinea corporis  Plan: Problem List Items Addressed This Visit    None    Visit Diagnoses    Tinea corporis    -  Primary   Relevant Medications  nystatin (MYCOSTATIN/NYSTOP) powder   fluconazole (DIFLUCAN) 150 MG tablet     Patient with very severe case of tinea corporis involving the entire panis surrounding the incision.  She currently has no systemic symptoms.  Will treat with nystatin powder for antifungal component and to help wick away moisture.  Told to keep the area as dry as possible particularly after showers or baths.  Given the extensive nature at presentation I do feel she would benefit from addition of systemic therapy.  Given her chronic stage III kidney disease she will have slower clearance of diflucan.  Will have patient do two doses of fluconazole 150mg  weekly.  I will follow up with the patient in the next 2 weeks to verify improvement in symptoms.  If no improvement we discussed obtaining cultures to see if yeast other than candida is implicated.    The patient is not currently breast feeding, using BTL for contraception.  A total of 15 minutes were spent in face-to-face contact with the patient during this encounter with over half of that time devoted to counseling and coordination of care.

## 2016-10-14 NOTE — Patient Instructions (Signed)

## 2016-10-29 ENCOUNTER — Encounter: Payer: Self-pay | Admitting: Obstetrics and Gynecology

## 2016-10-29 ENCOUNTER — Ambulatory Visit (INDEPENDENT_AMBULATORY_CARE_PROVIDER_SITE_OTHER): Payer: Medicaid Other | Admitting: Obstetrics and Gynecology

## 2016-10-29 VITALS — BP 144/70 | HR 63 | Wt 177.0 lb

## 2016-10-29 DIAGNOSIS — B354 Tinea corporis: Secondary | ICD-10-CM

## 2016-10-29 DIAGNOSIS — K649 Unspecified hemorrhoids: Secondary | ICD-10-CM

## 2016-10-29 MED ORDER — LIDOCAINE-HYDROCORTISONE ACE 2-2 % RE KIT: PACK | RECTAL | 3 refills | Status: DC

## 2016-10-29 MED ORDER — NYSTATIN 100000 UNIT/GM EX POWD: Freq: Three times a day (TID) | CUTANEOUS | 1 refills | Status: DC

## 2016-10-29 NOTE — Progress Notes (Signed)
Obstetrics & Gynecology Office Visit   Chief Complaint:  Chief Complaint  Patient presents with  . Wound Check    History of Present Illness: 46 year old [id number] presenting for follow up tinea corporis surrounding her prior cesarean section scar and panis. She reports some continued symptoms but significant improvement since finishing out the diflucan and continuing on the nystatin powder.  No fevers, chills, discharge, or new skin lesions.   She does report having had a hemorrhoids flare.    Review of Systems: Review of Systems  Constitutional: Negative for chills and fever.  Skin: Negative for itching and rash.    Past Medical History:  Past Medical History:  Diagnosis Date  . Acid reflux   . Allergic rhinitis 06/26/2015  . Allergy   . Asthma   . Carpal tunnel syndrome    right hand  . Chronic kidney disease   . Colitis   . CVA (cerebral infarction)    2009, 2015  . History of syncope   . Hypertension 2008  . Low HDL (under 40) 06/26/2015  . MI (myocardial infarction) ([place])    2015 - patient wasnt told by cardiologist that she had MI but has had cardiac cath in past  . Migraines   . Poor circulation   . Renal insufficiency   . Stroke ([place])   . Syncope and collapse     Past Surgical History:  Past Surgical History:  Procedure Laterality Date  . CARDIAC CATHETERIZATION    . CESAREAN SECTION  1994   Dr. Ammie Dalton  . COLONOSCOPY WITH PROPOFOL N/A 06/26/2014   Procedure: COLONOSCOPY WITH PROPOFOL;  Surgeon: Josefine Class, MD;  Location: [facility] ENDOSCOPY;  Service: Endoscopy;  Laterality: N/A;  . DILATION AND CURETTAGE OF UTERUS  x2 for incomplete SABs    Gynecologic History: No LMP recorded.  Obstetric History: [id number]  Family History:  Family History  Problem Relation Age of Onset  . Hyperlipidemia Mother   . Heart disease Mother        has a defibrillator  . COPD Mother   . Hypertension Father   . Heart disease Father   . Hyperlipidemia Father     . Hypertension Sister        died in her 15s from heart failure and end stage kidney disease  . Kidney disease Sister   . Hypertension Brother   . Heart disease Brother        has a defibrillator  . Coronary artery disease Brother        had CABG    Social History:  Social History   Social History  . Marital status: Married    Spouse name: N/A  . Number of children: 1  . Years of education: N/A   Occupational History  . Not on file.   Social History Main Topics  . Smoking status: Former Smoker    Packs/day: 0.25    Years: 18.00    Types: Cigarettes    Quit date: 08/01/2016  . Smokeless tobacco: Never Used  . Alcohol use Yes     Comment: "seldom" - drinks 350-731m when she does drink  . Drug use: No  . Sexual activity: Yes    Partners: Male    Birth control/ protection: None   Other Topics Concern  . Not on file   Social History Narrative  . No narrative on file    Allergies:  Allergies  Allergen Reactions  . Coconut Oil Swelling  .  Amoxicillin Rash  . Penicillins Itching and Other (See Comments)    Has patient had a PCN reaction causing immediate rash, facial/tongue/throat swelling, SOB or lightheadedness with hypotension: No Has patient had a PCN reaction causing severe rash involving mucus membranes or skin necrosis: No Has patient had a PCN reaction that required hospitalization: No Has patient had a PCN reaction occurring within the last 10 years: Yes If all of the above answers are "NO", then may proceed with Cephalosporin use.     Medications: Prior to Admission medications   Medication Sig Start Date End Date Taking? Authorizing Provider  amLODipine (NORVASC) 10 MG tablet Take 1 tablet (10 mg total) by mouth daily. 08/31/16  Yes Fritzi Mandes, MD  aspirin EC 81 MG tablet Take 1 tablet (81 mg total) by mouth daily. 08/31/16  Yes Fritzi Mandes, MD  cloNIDine (CATAPRES) 0.3 MG tablet Take 1 tablet (0.3 mg total) by mouth 3 (three) times daily. 08/30/16   Yes Fritzi Mandes, MD  fluticasone (FLONASE) 50 MCG/ACT nasal spray Place 1 spray into both nostrils 2 (two) times daily. 06/26/15  Yes Lada, Satira Anis, MD  nystatin (MYCOSTATIN/NYSTOP) powder Apply topically 3 (three) times daily. 10/29/16  Yes Malachy Mood, MD  omeprazole (PRILOSEC) 20 MG capsule Take 1 capsule (20 mg total) by mouth daily. 03/11/16  Yes McGowan, Larene Beach A, PA-C  polyethylene glycol (MIRALAX / GLYCOLAX) packet Take 17 g by mouth daily.   Yes [provider]  VENTOLIN HFA 108 (90 Base) MCG/ACT inhaler INHALE 2 PUFFS EVERY FOUR HOURS AS NEEDED FOR COUGH AND WHEEZING. 12/16/15  Yes McGowan, Larene Beach A, PA-C  carvedilol (COREG) 12.5 MG tablet Take 37.5 mg by mouth 2 (two) times daily.  08/06/16 10/05/16  Charlotte Crumb, NP  Lidocaine-Hydrocortisone Ace 2-2 % KIT Apply a small amount rectally BID prn hemerrhoids 10/29/16   Malachy Mood, MD  Mometasone Furoate 110 MCG/INH AEPB Inhale 2 puffs into the lungs 2 (two) times daily. Patient not taking: Reported on 09/13/2016 08/26/16 10/25/16  Valerie Roys, DO    Physical Exam Vitals:  Vitals:   10/29/16 1540  BP: (!) 144/70  Pulse: 63   No LMP recorded.  General: NAD HEENT: normocephalic, anicteric Pulmonary: no increased work of breathing Abdomen: Soft, non-tender, non-distended.  Umbilicus without lesions.  No hepatomegaly, splenomegaly or masses palpable. No evidence of hernia  Extremities: no edema, erythema, or tenderness Neurologic: Grossly intact Psychiatric: mood appropriate, affect full  Female chaperone present for pelvic and breast  portions of the physical exam  Assessment: 46 y.o. [id number] presenting for follow up tinea corporis   Plan: Problem List Items Addressed This Visit    None    Visit Diagnoses    Tinea corporis    -  Primary   Relevant Medications   nystatin (MYCOSTATIN/NYSTOP) powder   Hemorrhoids, unspecified hemorrhoid type         - Rx ana-lex and saving coupon for  hemorrhoids.  If fails to have improve discussed referral to GI to consider lancing or banding.  We discussed use of stool softeners as well as fiber supplements to ensure regular bowl movements of normal consistency.   - Finish out nystatin until symptoms have completely improved.  No systemic sign or symptoms.  Discussed should not require any further diflucan - A total of 15 minutes were spent in face-to-face contact with the patient during this encounter with over half of that time devoted to counseling and coordination of care. Return in about 1  year (around 10/29/2017) for annual.  -

## 2016-11-08 ENCOUNTER — Telehealth: Payer: Self-pay

## 2016-11-08 NOTE — Telephone Encounter (Signed)
Incoming fax from [place], [facility], will fax request back to pharmacy.

## 2017-01-06 ENCOUNTER — Other Ambulatory Visit: Payer: Self-pay

## 2017-01-13 ENCOUNTER — Ambulatory Visit: Payer: Self-pay | Admitting: Obstetrics and Gynecology

## 2017-01-13 VITALS — BP 188/71 | HR 58 | Temp 98.1°F | Wt 180.1 lb

## 2017-01-13 DIAGNOSIS — J3089 Other allergic rhinitis: Secondary | ICD-10-CM

## 2017-01-13 DIAGNOSIS — I10 Essential (primary) hypertension: Secondary | ICD-10-CM

## 2017-01-13 DIAGNOSIS — K219 Gastro-esophageal reflux disease without esophagitis: Secondary | ICD-10-CM

## 2017-01-13 DIAGNOSIS — J4541 Moderate persistent asthma with (acute) exacerbation: Secondary | ICD-10-CM

## 2017-01-13 MED ORDER — CETIRIZINE HCL 10 MG PO TABS: 10.0000 mg | ORAL_TABLET | Freq: Every day | ORAL | 0 refills | Status: DC

## 2017-01-13 MED ORDER — FUROSEMIDE 40 MG PO TABS: 40.0000 mg | ORAL_TABLET | Freq: Every day | ORAL | 0 refills | Status: DC

## 2017-01-13 MED ORDER — AMLODIPINE BESYLATE 10 MG PO TABS: 10.0000 mg | ORAL_TABLET | Freq: Every day | ORAL | 0 refills | Status: DC

## 2017-01-13 MED ORDER — FLUTICASONE PROPIONATE 50 MCG/ACT NA SUSP: 1.0000 | Freq: Two times a day (BID) | NASAL | 2 refills | Status: DC

## 2017-01-13 MED ORDER — CLONIDINE HCL 0.3 MG PO TABS: 0.3000 mg | ORAL_TABLET | Freq: Three times a day (TID) | ORAL | 0 refills | Status: DC

## 2017-01-13 MED ORDER — OMEPRAZOLE 20 MG PO CPDR: 20.0000 mg | DELAYED_RELEASE_CAPSULE | Freq: Every day | ORAL | 0 refills | Status: DC

## 2017-01-13 MED ORDER — FLUTICASONE-SALMETEROL 100-50 MCG/DOSE IN AEPB: 1.0000 | INHALATION_SPRAY | Freq: Two times a day (BID) | RESPIRATORY_TRACT | 2 refills | Status: DC

## 2017-01-13 NOTE — Patient Instructions (Signed)
I value your feedback and entrusting us with your care. If you get a [facility] patient survey, I would appreciate you taking the time to let us know about your experience today. Thank you! 

## 2017-01-13 NOTE — Progress Notes (Signed)
Chief Complaint  Patient presents with  . Follow-up    HPI:      [name] is Schultz 47 y.o. [id number] who LMP was No LMP recorded., presents today for HTN f/u. Pt ran out of amlodipine about Schultz wk ago. No way to check Bps at home. No side effects of meds. Still taking clonidine and lasix. Was seeing Dr. Serafina Schultz and nephrology. Can't see them anymore due to [facility] MCD.   Needs RF on all meds for asthma, GERD, allergies. Can't get asmanex for asthma. Needs steroid inhaler. Sx infrequent. Has albuterol Rx.   Due for CMP.   Past Medical History:  Diagnosis Date  . Acid reflux   . Allergic rhinitis 06/26/2015  . Allergy   . Asthma   . Carpal tunnel syndrome    right hand  . Chronic kidney disease   . Colitis   . CVA (cerebral infarction)    2009, 2015  . History of syncope   . Hypertension 2008  . Low HDL (under 40) 06/26/2015  . MI (myocardial infarction) ([place])    2015 - patient wasnt told by cardiologist that she had MI but has had cardiac cath in past  . Migraines   . Poor circulation   . Renal insufficiency   . Stroke ([place])   . Syncope and collapse     Past Surgical History:  Procedure Laterality Date  . CARDIAC CATHETERIZATION    . CESAREAN SECTION  1994   Dr. Ammie Schultz  . COLONOSCOPY WITH PROPOFOL N/Schultz 06/26/2014   Procedure: COLONOSCOPY WITH PROPOFOL;  Surgeon: Gabrielle Class, MD;  Location: [facility] ENDOSCOPY;  Service: Endoscopy;  Laterality: N/Schultz;  . DILATION AND CURETTAGE OF UTERUS  x2 for incomplete SABs    Family History  Problem Relation Age of Onset  . Hyperlipidemia Mother   . Heart disease Mother        has Schultz defibrillator  . COPD Mother   . Hypertension Father   . Heart disease Father   . Hyperlipidemia Father   . Hypertension Sister        died in her 71s from heart failure and end stage kidney disease  . Kidney disease Sister   . Hypertension Brother   . Heart disease Brother        has Schultz defibrillator  . Coronary artery disease Brother         had CABG    Social History   Socioeconomic History  . Marital status: Married    Spouse name: Not on file  . Number of children: 1  . Years of education: Not on file  . Highest education level: Not on file  Social Needs  . Financial resource strain: Not on file  . Food insecurity - worry: Not on file  . Food insecurity - inability: Not on file  . Transportation needs - medical: Not on file  . Transportation needs - non-medical: Not on file  Occupational History  . Not on file  Tobacco Use  . Smoking status: Former Smoker    Packs/day: 0.25    Years: 18.00    Pack years: 4.50    Types: Cigarettes    Last attempt to quit: 08/01/2016    Years since quitting: 0.4  . Smokeless tobacco: Never Used  Substance and Sexual Activity  . Alcohol use: Yes    Comment: "seldom" - drinks 350-756m when she does drink  . Drug use: No  . Sexual activity:  Yes    Partners: Male    Birth control/protection: None  Other Topics Concern  . Not on file  Social History Narrative  . Not on file     Current Outpatient Medications:  .  amLODipine (NORVASC) 10 MG tablet, Take 1 tablet (10 mg total) by mouth daily., Disp: 30 tablet, Rfl: 0 .  aspirin EC 81 MG tablet, Take 1 tablet (81 mg total) by mouth daily., Disp: 30 tablet, Rfl: 0 .  cetirizine (ZYRTEC) 10 MG tablet, Take 1 tablet (10 mg total) by mouth daily., Disp: 30 tablet, Rfl: 0 .  cloNIDine (CATAPRES) 0.3 MG tablet, Take 1 tablet (0.3 mg total) by mouth 3 (three) times daily., Disp: 60 tablet, Rfl: 0 .  fluticasone (FLONASE) 50 MCG/ACT nasal spray, Place 1 spray into both nostrils 2 (two) times daily., Disp: 16 g, Rfl: 2 .  furosemide (LASIX) 40 MG tablet, Take 1 tablet (40 mg total) by mouth daily., Disp: 30 tablet, Rfl: 0 .  Lidocaine-Hydrocortisone Ace 2-2 % KIT, Apply Schultz small amount rectally BID prn hemerrhoids, Disp: 1 each, Rfl: 3 .  nystatin (MYCOSTATIN/NYSTOP) powder, Apply topically 3 (three) times daily., Disp: 60 g,  Rfl: 1 .  omeprazole (PRILOSEC) 20 MG capsule, Take 1 capsule (20 mg total) by mouth daily., Disp: 90 capsule, Rfl: 0 .  polyethylene glycol (MIRALAX / GLYCOLAX) packet, Take 17 g by mouth daily., Disp: , Rfl:  .  VENTOLIN HFA 108 (90 Base) MCG/ACT inhaler, INHALE 2 PUFFS EVERY FOUR HOURS AS NEEDED FOR COUGH AND WHEEZING., Disp: 54 g, Rfl: 0 .  carvedilol (COREG) 12.5 MG tablet, Take 37.5 mg by mouth 2 (two) times daily. , Disp: , Rfl:  .  Fluticasone-Salmeterol (ADVAIR DISKUS) 100-50 MCG/DOSE AEPB, Inhale 1 puff into the lungs 2 (two) times daily., Disp: 60 each, Rfl: 2 .  Mometasone Furoate 110 MCG/INH AEPB, Inhale 2 puffs into the lungs 2 (two) times daily. (Patient not taking: Reported on 09/13/2016), Disp: 1 Inhaler, Rfl: 3  Current Facility-Administered Medications:  .  beclomethasone (QVAR) 40 MCG/ACT inhaler 1 puff, 1 puff, Inhalation, BID, McGowan, Gabrielle A, PA-C   ROS:  Review of Systems  Constitutional: Negative for fatigue, fever and unexpected weight change.  Respiratory: Negative for cough, shortness of breath and wheezing.   Cardiovascular: Negative for chest pain, palpitations and leg swelling.  Gastrointestinal: Negative for blood in stool, constipation, diarrhea, nausea and vomiting.  Endocrine: Negative for cold intolerance, heat intolerance and polyuria.  Genitourinary: Negative for dyspareunia, dysuria, flank pain, frequency, genital sores, hematuria, menstrual problem, pelvic pain, urgency, vaginal bleeding, vaginal discharge and vaginal pain.  Musculoskeletal: Negative for back pain, joint swelling and myalgias.  Skin: Negative for rash.  Neurological: Negative for dizziness, syncope, light-headedness, numbness and headaches.  Hematological: Negative for adenopathy.  Psychiatric/Behavioral: Negative for agitation, confusion, sleep disturbance and suicidal ideas. The patient is not nervous/anxious.      OBJECTIVE:   Vitals:  BP (!) 188/71 (BP Location: Left Arm,  Patient Position: Sitting, Cuff Size: Normal)   Pulse (!) 58   Temp 98.1 F (36.7 C)   Wt 180 lb 1.6 oz (81.7 kg)   BMI 34.03 kg/m   Physical Exam  Constitutional: She is oriented to person, place, and time and well-developed, well-nourished, and in no distress.  Neck: Normal range of motion. Neck supple.  Cardiovascular: Normal rate and regular rhythm.  Murmur heard. Pulmonary/Chest: Effort normal and breath sounds normal. She has no wheezes. She has no rales.  Neurological: She is alert and oriented to person, place, and time.  Skin: Skin is warm.  Psychiatric: Memory, affect and judgment normal.  Vitals reviewed.     Assessment/Plan: Essential hypertension - Not controlled. Pt ran out of amlodipine 1 wk off. Restart Rx. F/u for BP check in 4 wks. Cont clonidine and lasix. CMP today. - Plan: Comprehensive metabolic panel, amLODipine (NORVASC) 10 MG tablet, cloNIDine (CATAPRES) 0.3 MG tablet, furosemide (LASIX) 40 MG tablet  Moderate persistent asthma with acute exacerbation - Lungs CTAB. Rx advair (can't get asmanex and can't tolerate QVAR) BID. Cont albuterol--has Rx RF per pt. - Plan: Fluticasone-Salmeterol (ADVAIR DISKUS) 100-50 MCG/DOSE AEPB  Allergic rhinitis due to other allergic trigger, unspecified seasonality - Rx RF zyrtec and flonase. - Plan: fluticasone (FLONASE) 50 MCG/ACT nasal spray, cetirizine (ZYRTEC) 10 MG tablet  Gastroesophageal reflux disease without esophagitis - Rx RF omeprazole.  - Plan: omeprazole (PRILOSEC) 20 MG capsule    Meds ordered this encounter  Medications  . amLODipine (NORVASC) 10 MG tablet    Sig: Take 1 tablet (10 mg total) by mouth daily.    Dispense:  30 tablet    Refill:  0  . cloNIDine (CATAPRES) 0.3 MG tablet    Sig: Take 1 tablet (0.3 mg total) by mouth 3 (three) times daily.    Dispense:  60 tablet    Refill:  0  . fluticasone (FLONASE) 50 MCG/ACT nasal spray    Sig: Place 1 spray into both nostrils 2 (two) times daily.     Dispense:  16 g    Refill:  2  . furosemide (LASIX) 40 MG tablet    Sig: Take 1 tablet (40 mg total) by mouth daily.    Dispense:  30 tablet    Refill:  0  . cetirizine (ZYRTEC) 10 MG tablet    Sig: Take 1 tablet (10 mg total) by mouth daily.    Dispense:  30 tablet    Refill:  0  . omeprazole (PRILOSEC) 20 MG capsule    Sig: Take 1 capsule (20 mg total) by mouth daily.    Dispense:  90 capsule    Refill:  0  . Fluticasone-Salmeterol (ADVAIR DISKUS) 100-50 MCG/DOSE AEPB    Sig: Inhale 1 puff into the lungs 2 (two) times daily.    Dispense:  60 each    Refill:  2      Return in about 4 weeks (around 02/10/2017) for BP f/u.  Alicia B. Copland, PA-C 01/13/2017 6:42 PM

## 2017-01-14 LAB — COMPREHENSIVE METABOLIC PANEL
A/G RATIO: 1.3 (ref 1.2–2.2)
ALT: 8 IU/L (ref 0–32)
AST: 12 IU/L (ref 0–40)
Albumin: 3.9 g/dL (ref 3.5–5.5)
Alkaline Phosphatase: 124 IU/L — ABNORMAL HIGH (ref 39–117)
BUN/Creatinine Ratio: 13 (ref 9–23)
BUN: 26 mg/dL — ABNORMAL HIGH (ref 6–24)
Bilirubin Total: <0.2 mg/dL (ref 0.0–1.2)
CALCIUM: 9.4 mg/dL (ref 8.7–10.2)
CO2: 23 mmol/L (ref 20–29)
CREATININE: 2 mg/dL — AB (ref 0.57–1.00)
Chloride: 104 mmol/L (ref 96–106)
GFR, EST AFRICAN AMERICAN: 34 mL/min/{1.73_m2} — AB (ref >59)
GFR, EST NON AFRICAN AMERICAN: 30 mL/min/{1.73_m2} — AB (ref >59)
Globulin, Total: 2.9 g/dL (ref 1.5–4.5)
Glucose: 93 mg/dL (ref 65–99)
POTASSIUM: 4.3 mmol/L (ref 3.5–5.2)
Sodium: 141 mmol/L (ref 134–144)
TOTAL PROTEIN: 6.8 g/dL (ref 6.0–8.5)

## 2017-01-18 ENCOUNTER — Emergency Department: Payer: Medicaid Other

## 2017-01-18 ENCOUNTER — Other Ambulatory Visit: Payer: Self-pay

## 2017-01-18 ENCOUNTER — Encounter: Payer: Self-pay | Admitting: Emergency Medicine

## 2017-01-18 ENCOUNTER — Inpatient Hospital Stay
Admission: EM | Admit: 2017-01-18 | Discharge: 2017-01-20 | DRG: 392 | Disposition: A | Discharge status: Home / Self Care | Payer: Medicaid Other | Attending: Specialist | Admitting: Specialist

## 2017-01-18 DIAGNOSIS — J4520 Mild intermittent asthma, uncomplicated: Secondary | ICD-10-CM

## 2017-01-18 DIAGNOSIS — Z7951 Long term (current) use of inhaled steroids: Secondary | ICD-10-CM | POA: Diagnosis not present

## 2017-01-18 DIAGNOSIS — A084 Viral intestinal infection, unspecified: Secondary | ICD-10-CM | POA: Diagnosis not present

## 2017-01-18 DIAGNOSIS — Z87891 Personal history of nicotine dependence: Secondary | ICD-10-CM | POA: Diagnosis not present

## 2017-01-18 DIAGNOSIS — R109 Unspecified abdominal pain: Secondary | ICD-10-CM | POA: Diagnosis present

## 2017-01-18 DIAGNOSIS — J449 Chronic obstructive pulmonary disease, unspecified: Secondary | ICD-10-CM | POA: Diagnosis present

## 2017-01-18 DIAGNOSIS — Z91018 Allergy to other foods: Secondary | ICD-10-CM

## 2017-01-18 DIAGNOSIS — Z880 Allergy status to penicillin: Secondary | ICD-10-CM

## 2017-01-18 DIAGNOSIS — I252 Old myocardial infarction: Secondary | ICD-10-CM | POA: Diagnosis not present

## 2017-01-18 DIAGNOSIS — N189 Chronic kidney disease, unspecified: Secondary | ICD-10-CM | POA: Diagnosis present

## 2017-01-18 DIAGNOSIS — R6510 Systemic inflammatory response syndrome (SIRS) of non-infectious origin without acute organ dysfunction: Secondary | ICD-10-CM | POA: Diagnosis present

## 2017-01-18 DIAGNOSIS — K219 Gastro-esophageal reflux disease without esophagitis: Secondary | ICD-10-CM | POA: Diagnosis present

## 2017-01-18 DIAGNOSIS — R197 Diarrhea, unspecified: Secondary | ICD-10-CM

## 2017-01-18 DIAGNOSIS — N184 Chronic kidney disease, stage 4 (severe): Secondary | ICD-10-CM | POA: Diagnosis present

## 2017-01-18 DIAGNOSIS — I10 Essential (primary) hypertension: Secondary | ICD-10-CM | POA: Diagnosis present

## 2017-01-18 DIAGNOSIS — R509 Fever, unspecified: Secondary | ICD-10-CM

## 2017-01-18 DIAGNOSIS — Z8673 Personal history of transient ischemic attack (TIA), and cerebral infarction without residual deficits: Secondary | ICD-10-CM

## 2017-01-18 DIAGNOSIS — N183 Chronic kidney disease, stage 3 unspecified: Secondary | ICD-10-CM | POA: Diagnosis present

## 2017-01-18 DIAGNOSIS — Z79899 Other long term (current) drug therapy: Secondary | ICD-10-CM | POA: Diagnosis not present

## 2017-01-18 DIAGNOSIS — I129 Hypertensive chronic kidney disease with stage 1 through stage 4 chronic kidney disease, or unspecified chronic kidney disease: Secondary | ICD-10-CM | POA: Diagnosis present

## 2017-01-18 DIAGNOSIS — Z8249 Family history of ischemic heart disease and other diseases of the circulatory system: Secondary | ICD-10-CM

## 2017-01-18 DIAGNOSIS — D72825 Bandemia: Secondary | ICD-10-CM | POA: Diagnosis present

## 2017-01-18 DIAGNOSIS — Z7982 Long term (current) use of aspirin: Secondary | ICD-10-CM

## 2017-01-18 DIAGNOSIS — R112 Nausea with vomiting, unspecified: Secondary | ICD-10-CM

## 2017-01-18 LAB — COMPREHENSIVE METABOLIC PANEL
ALT: 11 U/L — AB (ref 14–54)
AST: 19 U/L (ref 15–41)
Albumin: 3.9 g/dL (ref 3.5–5.0)
Alkaline Phosphatase: 133 U/L — ABNORMAL HIGH (ref 38–126)
Anion gap: 10 (ref 5–15)
BILIRUBIN TOTAL: 0.5 mg/dL (ref 0.3–1.2)
BUN: 26 mg/dL — AB (ref 6–20)
CHLORIDE: 108 mmol/L (ref 101–111)
CO2: 23 mmol/L (ref 22–32)
CREATININE: 2.16 mg/dL — AB (ref 0.44–1.00)
Calcium: 9 mg/dL (ref 8.9–10.3)
GFR calc Af Amer: 31 mL/min — ABNORMAL LOW (ref >60)
GFR, EST NON AFRICAN AMERICAN: 27 mL/min — AB (ref >60)
Glucose, Bld: 114 mg/dL — ABNORMAL HIGH (ref 65–99)
Potassium: 3.7 mmol/L (ref 3.5–5.1)
Sodium: 141 mmol/L (ref 135–145)
Total Protein: 7.4 g/dL (ref 6.5–8.1)

## 2017-01-18 LAB — CBC WITH DIFFERENTIAL/PLATELET
Basophils Absolute: 0 10*3/uL (ref 0–0.1)
Basophils Relative: 0 %
EOS ABS: 0.1 10*3/uL (ref 0–0.7)
EOS PCT: 1 %
HCT: 37.1 % (ref 35.0–47.0)
Hemoglobin: 12.1 g/dL (ref 12.0–16.0)
Lymphocytes Relative: 2 %
Lymphs Abs: 0.2 10*3/uL — ABNORMAL LOW (ref 1.0–3.6)
MCH: 27 pg (ref 26.0–34.0)
MCHC: 32.4 g/dL (ref 32.0–36.0)
MCV: 83.3 fL (ref 80.0–100.0)
MONOS PCT: 2 %
Monocytes Absolute: 0.2 10*3/uL (ref 0.2–0.9)
Neutro Abs: 11.1 10*3/uL — ABNORMAL HIGH (ref 1.4–6.5)
Neutrophils Relative %: 95 %
PLATELETS: 257 10*3/uL (ref 150–440)
RBC: 4.46 MIL/uL (ref 3.80–5.20)
RDW: 17.8 % — AB (ref 11.5–14.5)
WBC: 11.7 10*3/uL — AB (ref 3.6–11.0)

## 2017-01-18 LAB — URINALYSIS, COMPLETE (UACMP) WITH MICROSCOPIC
Bacteria, UA: NONE SEEN
Bilirubin Urine: NEGATIVE
GLUCOSE, UA: NEGATIVE mg/dL
Ketones, ur: NEGATIVE mg/dL
Leukocytes, UA: NEGATIVE
Nitrite: NEGATIVE
PH: 6 (ref 5.0–8.0)
Protein, ur: 100 mg/dL — AB
SPECIFIC GRAVITY, URINE: 1.011 (ref 1.005–1.030)

## 2017-01-18 LAB — PREGNANCY, URINE: Preg Test, Ur: NEGATIVE

## 2017-01-18 LAB — LIPASE, BLOOD: Lipase: 26 U/L (ref 11–51)

## 2017-01-18 LAB — TROPONIN I: Troponin I: 0.06 ng/mL (ref <0.03)

## 2017-01-18 MED ORDER — ONDANSETRON HCL 4 MG/2ML IJ SOLN
4.0000 mg | Freq: Once | INTRAMUSCULAR | Status: AC
Start: 2017-01-18 — End: 2017-01-18
  Administered 2017-01-18: 4 mg via INTRAVENOUS

## 2017-01-18 MED ORDER — VANCOMYCIN HCL IN DEXTROSE 1-5 GM/200ML-% IV SOLN
1000.0000 mg | Freq: Once | INTRAVENOUS | Status: AC
  Administered 2017-01-19: 1000 mg via INTRAVENOUS
  Filled 2017-01-18: qty 200

## 2017-01-18 MED ORDER — ONDANSETRON HCL 4 MG/2ML IJ SOLN
INTRAMUSCULAR | Status: AC
  Administered 2017-01-18: 4 mg via INTRAVENOUS
  Filled 2017-01-18: qty 2

## 2017-01-18 MED ORDER — SODIUM CHLORIDE 0.9 % IV BOLUS (SEPSIS)
1000.0000 mL | Freq: Once | INTRAVENOUS | Status: AC
  Administered 2017-01-18: 1000 mL via INTRAVENOUS

## 2017-01-18 MED ORDER — METRONIDAZOLE IN NACL 5-0.79 MG/ML-% IV SOLN
500.0000 mg | Freq: Three times a day (TID) | INTRAVENOUS | Status: DC
  Administered 2017-01-19 – 2017-01-20 (×5): 500 mg via INTRAVENOUS
  Filled 2017-01-18 (×7): qty 100

## 2017-01-18 MED ORDER — PROMETHAZINE HCL 25 MG PO TABS: 25.0000 mg | ORAL_TABLET | Freq: Four times a day (QID) | ORAL | 0 refills | Status: DC | PRN

## 2017-01-18 MED ORDER — LEVOFLOXACIN IN D5W 750 MG/150ML IV SOLN
750.0000 mg | Freq: Once | INTRAVENOUS | Status: AC
  Administered 2017-01-19: 750 mg via INTRAVENOUS
  Filled 2017-01-18: qty 150

## 2017-01-18 MED ORDER — PROMETHAZINE HCL 25 MG/ML IJ SOLN
25.0000 mg | Freq: Once | INTRAMUSCULAR | Status: AC
Start: 2017-01-18 — End: 2017-01-18
  Administered 2017-01-18: 25 mg via INTRAVENOUS

## 2017-01-18 MED ORDER — IOPAMIDOL (ISOVUE-300) INJECTION 61%
30.0000 mL | Freq: Once | INTRAVENOUS | Status: AC
Start: 2017-01-18 — End: 2017-01-18
  Administered 2017-01-18: 30 mL via ORAL

## 2017-01-18 MED ORDER — PIPERACILLIN-TAZOBACTAM 3.375 G IVPB: 3.3750 g | Freq: Three times a day (TID) | INTRAVENOUS | Status: DC

## 2017-01-18 MED ORDER — PROMETHAZINE HCL 25 MG/ML IJ SOLN
INTRAMUSCULAR | Status: AC
  Filled 2017-01-18: qty 1

## 2017-01-18 NOTE — H&P (Signed)
Gabrielle Schultz at [place] NAME: Gabrielle Schultz    MR#:  [id number]  DATE OF BIRTH:  1970-11-21  DATE OF ADMISSION:  01/18/2017  PRIMARY CARE PHYSICIAN: Boyce Medici, FNP   REQUESTING/REFERRING PHYSICIAN: Cinda Quest, MD  CHIEF COMPLAINT:   Chief Complaint  Patient presents with  . Emesis  . Diarrhea    HISTORY OF PRESENT ILLNESS:  Gabrielle Schultz  is a 47 y.o. female who presents with left-sided abdominal pain with nausea, vomiting and diarrhea.  Patient states she took an Imodium prior to coming to the ED.  Here she is found to be tachycardic, with bandemia and mild leukocytosis, and she developed a fever to 102 Fahrenheit while in the ED.  CT abdomen without contrast did not show any significant contributing pathology.  Patient meets sirs criteria, with unclear infectious source at this time, though abdominal infection is strongly suspected.  IV antibiotics started and hospitalist called for admission.  PAST MEDICAL HISTORY:   Past Medical History:  Diagnosis Date  . Acid reflux   . Allergic rhinitis 06/26/2015  . Allergy   . Asthma   . Carpal tunnel syndrome    right hand  . Chronic kidney disease   . Colitis   . CVA (cerebral infarction)    2009, 2015  . History of syncope   . Hypertension 2008  . Low HDL (under 40) 06/26/2015  . MI (myocardial infarction) ([place])    2015 - patient wasnt told by cardiologist that she had MI but has had cardiac cath in past  . Migraines   . Poor circulation   . Renal insufficiency   . Stroke ([place])   . Syncope and collapse     PAST SURGICAL HISTORY:   Past Surgical History:  Procedure Laterality Date  . CARDIAC CATHETERIZATION    . CESAREAN SECTION  1994   Dr. Ammie Dalton  . COLONOSCOPY WITH PROPOFOL N/A 06/26/2014   Procedure: COLONOSCOPY WITH PROPOFOL;  Surgeon: Josefine Class, MD;  Location: [facility] ENDOSCOPY;  Service: Endoscopy;  Laterality: N/A;  . DILATION AND CURETTAGE OF UTERUS  x2  for incomplete SABs    SOCIAL HISTORY:   Social History   Tobacco Use  . Smoking status: Former Smoker    Packs/day: 0.25    Years: 18.00    Pack years: 4.50    Types: Cigarettes    Last attempt to quit: 08/01/2016    Years since quitting: 0.4  . Smokeless tobacco: Never Used  Substance Use Topics  . Alcohol use: Yes    Comment: "seldom" - drinks 350-739m when she does drink    FAMILY HISTORY:   Family History  Problem Relation Age of Onset  . Hyperlipidemia Mother   . Heart disease Mother        has a defibrillator  . COPD Mother   . Hypertension Father   . Heart disease Father   . Hyperlipidemia Father   . Hypertension Sister        died in her 350sfrom heart failure and end stage kidney disease  . Kidney disease Sister   . Hypertension Brother   . Heart disease Brother        has a defibrillator  . Coronary artery disease Brother        had CABG    DRUG ALLERGIES:   Allergies  Allergen Reactions  . Coconut Oil Swelling  . Amoxicillin Rash  . Penicillins Itching and Other (See  Comments)    Has patient had a PCN reaction causing immediate rash, facial/tongue/throat swelling, SOB or lightheadedness with hypotension: No Has patient had a PCN reaction causing severe rash involving mucus membranes or skin necrosis: No Has patient had a PCN reaction that required hospitalization: No Has patient had a PCN reaction occurring within the last 10 years: Yes If all of the above answers are "NO", then may proceed with Cephalosporin use.     MEDICATIONS AT HOME:   Prior to Admission medications   Medication Sig Start Date End Date Taking? Authorizing Provider  amLODipine (NORVASC) 10 MG tablet Take 1 tablet (10 mg total) by mouth daily. 09/27/24   Copland, Deirdre Evener, PA-C  aspirin EC 81 MG tablet Take 1 tablet (81 mg total) by mouth daily. 08/31/16   Fritzi Mandes, MD  carvedilol (COREG) 12.5 MG tablet Take 37.5 mg by mouth 2 (two) times daily.  08/06/16 10/05/16  Charlotte Crumb, NP  cetirizine (ZYRTEC) 10 MG tablet Take 1 tablet (10 mg total) by mouth daily. 8/34/19   Copland, Elmo Putt B, PA-C  cloNIDine (CATAPRES) 0.3 MG tablet Take 1 tablet (0.3 mg total) by mouth 3 (three) times daily. 06/26/27   Copland, Deirdre Evener, PA-C  fluticasone (FLONASE) 50 MCG/ACT nasal spray Place 1 spray into both nostrils 2 (two) times daily. 7/98/92   Copland, Deirdre Evener, PA-C  Fluticasone-Salmeterol (ADVAIR DISKUS) 100-50 MCG/DOSE AEPB Inhale 1 puff into the lungs 2 (two) times daily. 01/23/39   Copland, Deirdre Evener, PA-C  furosemide (LASIX) 40 MG tablet Take 1 tablet (40 mg total) by mouth daily. 7/40/81   Copland, Deirdre Evener, PA-C  Lidocaine-Hydrocortisone Ace 2-2 % KIT Apply a small amount rectally BID prn hemerrhoids 10/29/16   Malachy Mood, MD  Mometasone Furoate 110 MCG/INH AEPB Inhale 2 puffs into the lungs 2 (two) times daily. Patient not taking: Reported on 09/13/2016 08/26/16 10/25/16  Park Liter P, DO  nystatin (MYCOSTATIN/NYSTOP) powder Apply topically 3 (three) times daily. 10/29/16   Malachy Mood, MD  omeprazole (PRILOSEC) 20 MG capsule Take 1 capsule (20 mg total) by mouth daily. 4/48/18   Copland, Alicia B, PA-C  polyethylene glycol (MIRALAX / GLYCOLAX) packet Take 17 g by mouth daily.    [provider]  promethazine (PHENERGAN) 25 MG tablet Take 1 tablet (25 mg total) by mouth every 6 (six) hours as needed for nausea or vomiting. 01/18/17   Nena Polio, MD  VENTOLIN HFA 108 [phone] Base) MCG/ACT inhaler INHALE 2 PUFFS EVERY FOUR HOURS AS NEEDED FOR COUGH AND WHEEZING. 12/16/15   Zara Council A, PA-C    REVIEW OF SYSTEMS:  Review of Systems  Constitutional: Positive for fever. Negative for chills, malaise/fatigue and weight loss.  HENT: Negative for ear pain, hearing loss and tinnitus.   Eyes: Negative for blurred vision, double vision, pain and redness.  Respiratory: Negative for cough, hemoptysis and shortness of breath.   Cardiovascular:  Negative for chest pain, palpitations, orthopnea and leg swelling.  Gastrointestinal: Positive for abdominal pain, diarrhea, nausea and vomiting. Negative for constipation.  Genitourinary: Negative for dysuria, frequency and hematuria.  Musculoskeletal: Negative for back pain, joint pain and neck pain.  Skin:       No acne, rash, or lesions  Neurological: Negative for dizziness, tremors, focal weakness and weakness.  Endo/Heme/Allergies: Negative for polydipsia. Does not bruise/bleed easily.  Psychiatric/Behavioral: Negative for depression. The patient is not nervous/anxious and does not have insomnia.      VITAL SIGNS:  Vitals:   01/18/17 1915 01/18/17 2000 01/18/17 2242 01/18/17 2300  BP: (!) 188/91 (!) 175/76 (!) 175/72 (!) 167/98  Pulse: (!) 113 (!) 110  (!) 122  Resp: 20  15 (!) 23  Temp: 99 F (37.2 C)  (!) 102.2 F (39 C)   TempSrc: Oral  Oral   SpO2: 100% 100% 97% 97%  Weight: 81.6 kg (180 lb)     Height: '5\' 1"'$  (1.549 m)      Wt Readings from Last 3 Encounters:  01/18/17 81.6 kg (180 lb)  01/13/17 81.7 kg (180 lb 1.6 oz)  10/29/16 80.3 kg (177 lb)    PHYSICAL EXAMINATION:  Physical Exam  Vitals reviewed. Constitutional: She is oriented to person, place, and time. She appears well-developed and well-nourished. No distress.  HENT:  Head: Normocephalic and atraumatic.  Dry mucous membranes  Eyes: Conjunctivae and EOM are normal. Pupils are equal, round, and reactive to light. No scleral icterus.  Neck: Normal range of motion. Neck supple. No JVD present. No thyromegaly present.  Cardiovascular: Regular rhythm and intact distal pulses. Exam reveals no gallop and no friction rub.  No murmur heard. Tachycardic  Respiratory: Effort normal and breath sounds normal. No respiratory distress. She has no wheezes. She has no rales.  GI: Soft. Bowel sounds are normal. She exhibits no distension. There is tenderness.  Musculoskeletal: Normal range of motion. She exhibits no  edema.  No arthritis, no gout  Lymphadenopathy:    She has no cervical adenopathy.  Neurological: She is alert and oriented to person, place, and time. No cranial nerve deficit.  No dysarthria, no aphasia  Skin: Skin is warm and dry. No rash noted. No erythema.  Psychiatric: She has a normal mood and affect. Her behavior is normal. Judgment and thought content normal.    LABORATORY PANEL:   CBC Recent Labs  Lab 01/18/17 1930  WBC 11.7*  HGB 12.1  HCT 37.1  PLT 257   ------------------------------------------------------------------------------------------------------------------  Chemistries  Recent Labs  Lab 01/18/17 1930  NA 141  K 3.7  CL 108  CO2 23  GLUCOSE 114*  BUN 26*  CREATININE 2.16*  CALCIUM 9.0  AST 19  ALT 11*  ALKPHOS 133*  BILITOT 0.5   ------------------------------------------------------------------------------------------------------------------  Cardiac Enzymes No results for input(s): TROPONINI in the last 168 hours. ------------------------------------------------------------------------------------------------------------------  RADIOLOGY:  Ct Abdomen Pelvis Wo Contrast  Result Date: 01/18/2017 CLINICAL DATA:  Abdominal pain with emesis and diarrhea. EXAM: CT ABDOMEN AND PELVIS WITHOUT CONTRAST TECHNIQUE: Multidetector CT imaging of the abdomen and pelvis was performed following the standard protocol without IV contrast. COMPARISON:  CT abdomen pelvis 07/20/2016, 06/25/2014, 10/19/2012. FINDINGS: Lower chest: No pulmonary nodules or pleural effusion. No visible pericardial effusion. Hepatobiliary: Normal hepatic contours and density. No visible biliary dilatation. Normal gallbladder. Pancreas: Normal contours without ductal dilatation. No peripancreatic fluid collection. Spleen: Normal. Adrenals/Urinary Tract: --Adrenal glands: Normal. --Right kidney/ureter: No hydronephrosis or perinephric stranding. No nephrolithiasis. No obstructing ureteral  stones. --Left kidney/ureter: No hydronephrosis or perinephric stranding. No nephrolithiasis. No obstructing ureteral stones. --Urinary bladder: Unremarkable. Stomach/Bowel: --Stomach/Duodenum: No hiatal hernia or other gastric abnormality. Normal duodenal course and caliber. --Small bowel: No dilatation or inflammation. --Colon: No focal abnormality. --Appendix: Normal. Vascular/Lymphatic: Atherosclerotic calcification is present within the non-aneurysmal abdominal aorta, without hemodynamically significant stenosis. No abdominal or pelvic lymphadenopathy. Reproductive: Normal uterus and ovaries. Musculoskeletal. No bony spinal canal stenosis or focal osseous abnormality. Other: None. IMPRESSION: 1. No acute abdominopelvic abnormality. 2.  Aortic Atherosclerosis (ICD10-I70.0). Electronically  Signed   By: Ulyses Jarred M.D.   On: 01/18/2017 21:24    EKG:   Orders placed or performed during the hospital encounter of 08/28/16  . EKG 12-Lead  . EKG 12-Lead  . ED EKG within 10 minutes  . ED EKG within 10 minutes  . ED EKG  . ED EKG  . EKG    IMPRESSION AND PLAN:  Principal Problem:   SIRS (systemic inflammatory response syndrome) (HCC) -abdominal infection suspected, the patient's diarrhea has stopped due to the Imodium she took, so we are unable to assess stool studies at this time, though these studies are ordered and pending for when she has a bowel movement.  IV antibiotics started, lactic acid pending, blood pressure stable Active Problems:   Abdominal pain -suspect abdominal infection as above, IV antibiotics started, PRN supportive medications   Hypertension -continue home meds   CKD (chronic kidney disease), stage IV ([place]) -patient was borderline stage IV chronically, we will hydrate her with IV fluids, avoid nephrotoxins and monitor  All the records are reviewed and case discussed with ED provider. Management plans discussed with the patient and/or family.  DVT PROPHYLAXIS: SubQ  heparin  GI PROPHYLAXIS: None  ADMISSION STATUS: Inpatient  CODE STATUS: Full Code Status History    Date Active Date Inactive Code Status Order ID Comments User Context   08/28/2016 22:45 08/30/2016 14:12 Full Code [id number]  Dustin Flock, MD Inpatient   06/25/2014 11:55 06/27/2014 14:01 Full Code [id number]  Henreitta Leber, MD Inpatient      TOTAL TIME TAKING CARE OF THIS PATIENT: 45 minutes.   Jannifer Franklin, Alyvia Derk [place] 01/18/2017, 11:13 PM  [employer]  [phone]  CC: Primary care physician; Boyce Medici, FNP  Note:  This document was prepared using Dragon voice recognition software and may include unintentional dictation errors.

## 2017-01-18 NOTE — ED Triage Notes (Signed)
Patient to ER via [facility] from home for c/o emesis and diarrhea. Patient has h/o ischemic colitis. Stated to EMS that she called EMS because she didn't want to wait in the ER. Patient states she rarely vomits, has not "vomited in 20 years" and does not have any medication at home for nausea. Patient has been out of HTN medication x1 week d/t not being able to afford it. Patient states she has had 3 episodes of emesis today with 4-5 episodes of diarrhea.

## 2017-01-18 NOTE — ED Provider Notes (Addendum)
[facility] Emergency Department Provider Note   ____________________________________________   First MD Initiated Contact with Patient 01/18/17 1921     (approximate)  I have reviewed the triage vital signs and the nursing notes.   HISTORY  Chief Complaint Emesis and Diarrhea    HPI Gabrielle Schultz is a 47 y.o. female Who comes in because she started having nausea vomiting diarrhea after 1:00 today. She reports for 5 episodes of diarrhea and 3 episodes of emesis. She was diagnosed with ischemic colitis in 2015. She has left sided abdominal pain and lower abdominal pain. She thinks lower abdominal pain may be left over from her C-sections. Pain is deep and achy. It is worse with palpation and worse when she moves. It is moderately severe.   Past Medical History:  Diagnosis Date  . Acid reflux   . Allergic rhinitis 06/26/2015  . Allergy   . Asthma   . Carpal tunnel syndrome    right hand  . Chronic kidney disease   . Colitis   . CVA (cerebral infarction)    2009, 2015  . History of syncope   . Hypertension 2008  . Low HDL (under 40) 06/26/2015  . MI (myocardial infarction) ([place])    2015 - patient wasnt told by cardiologist that she had MI but has had cardiac cath in past  . Migraines   . Poor circulation   . Renal insufficiency   . Stroke ([place])   . Syncope and collapse     Patient Active Problem List   Diagnosis Date Noted  . Chest pain 08/28/2016  . Edema of both feet 04/22/2016  . Abnormal TSH 04/22/2016  . Abdominal pain, right upper quadrant 12/02/2015  . Dermatitis 12/02/2015  . Urinary tract infection 10/28/2015  . Low HDL (under 40) 06/26/2015  . Allergic rhinitis 06/26/2015  . Tobacco abuse 06/26/2015  . Cerebral infarct ([place]) 11/21/2013  . Chronic kidney disease (CKD), stage III (moderate) ([place]) 11/21/2013  . Hypertension 08/12/2011  . LVH (left ventricular hypertrophy) 08/12/2011  . Chronic renal insufficiency 08/12/2011     Past Surgical History:  Procedure Laterality Date  . CARDIAC CATHETERIZATION    . CESAREAN SECTION  1994   Dr. Ammie Dalton  . COLONOSCOPY WITH PROPOFOL N/A 06/26/2014   Procedure: COLONOSCOPY WITH PROPOFOL;  Surgeon: Josefine Class, MD;  Location: [facility] ENDOSCOPY;  Service: Endoscopy;  Laterality: N/A;  . DILATION AND CURETTAGE OF UTERUS  x2 for incomplete SABs    Prior to Admission medications   Medication Sig Start Date End Date Taking? Authorizing Provider  amLODipine (NORVASC) 10 MG tablet Take 1 tablet (10 mg total) by mouth daily. 0/25/42   Copland, Deirdre Evener, PA-C  aspirin EC 81 MG tablet Take 1 tablet (81 mg total) by mouth daily. 08/31/16   Fritzi Mandes, MD  carvedilol (COREG) 12.5 MG tablet Take 37.5 mg by mouth 2 (two) times daily.  08/06/16 10/05/16  Charlotte Crumb, NP  cetirizine (ZYRTEC) 10 MG tablet Take 1 tablet (10 mg total) by mouth daily. 07/09/21   Copland, Elmo Putt B, PA-C  cloNIDine (CATAPRES) 0.3 MG tablet Take 1 tablet (0.3 mg total) by mouth 3 (three) times daily. 7/62/83   Copland, Deirdre Evener, PA-C  fluticasone (FLONASE) 50 MCG/ACT nasal spray Place 1 spray into both nostrils 2 (two) times daily. 1/51/76   Copland, Deirdre Evener, PA-C  Fluticasone-Salmeterol (ADVAIR DISKUS) 100-50 MCG/DOSE AEPB Inhale 1 puff into the lungs 2 (two) times daily. 01/13/17  Copland, Alicia B, PA-C  furosemide (LASIX) 40 MG tablet Take 1 tablet (40 mg total) by mouth daily. 03/19/38   Copland, Deirdre Evener, PA-C  Lidocaine-Hydrocortisone Ace 2-2 % KIT Apply a small amount rectally BID prn hemerrhoids 10/29/16   Malachy Mood, MD  Mometasone Furoate 110 MCG/INH AEPB Inhale 2 puffs into the lungs 2 (two) times daily. Patient not taking: Reported on 09/13/2016 08/26/16 10/25/16  Park Liter P, DO  nystatin (MYCOSTATIN/NYSTOP) powder Apply topically 3 (three) times daily. 10/29/16   Malachy Mood, MD  omeprazole (PRILOSEC) 20 MG capsule Take 1 capsule (20 mg total) by mouth daily. 0/86/76    Copland, Alicia B, PA-C  polyethylene glycol (MIRALAX / GLYCOLAX) packet Take 17 g by mouth daily.    [provider]  promethazine (PHENERGAN) 25 MG tablet Take 1 tablet (25 mg total) by mouth every 6 (six) hours as needed for nausea or vomiting. 01/18/17   Nena Polio, MD  VENTOLIN HFA 108 [phone] Base) MCG/ACT inhaler INHALE 2 PUFFS EVERY FOUR HOURS AS NEEDED FOR COUGH AND WHEEZING. 12/16/15   Ernestine Conrad, Larene Beach A, PA-C    Allergies Coconut oil; Amoxicillin; and Penicillins  Family History  Problem Relation Age of Onset  . Hyperlipidemia Mother   . Heart disease Mother        has a defibrillator  . COPD Mother   . Hypertension Father   . Heart disease Father   . Hyperlipidemia Father   . Hypertension Sister        died in her 57s from heart failure and end stage kidney disease  . Kidney disease Sister   . Hypertension Brother   . Heart disease Brother        has a defibrillator  . Coronary artery disease Brother        had CABG    Social History Social History   Tobacco Use  . Smoking status: Former Smoker    Packs/day: 0.25    Years: 18.00    Pack years: 4.50    Types: Cigarettes    Last attempt to quit: 08/01/2016    Years since quitting: 0.4  . Smokeless tobacco: Never Used  Substance Use Topics  . Alcohol use: Yes    Comment: "seldom" - drinks 350-758m when she does drink  . Drug use: No    Review of Systems  Constitutional: No fever/chills Eyes: No visual changes. ENT: No sore throat. Cardiovascular: Denies chest pain. Respiratory: Denies shortness of breath. Gastrointestinal:see history of present illnessn. Genitourinary: Negative for dysuria. Musculoskeletal: Negative for back pain. Skin: Negative for rash. Neurological: Negative for headaches, focal weakness   ____________________________________________   PHYSICAL EXAM:  VITAL SIGNS: ED Triage Vitals [01/18/17 1915]  Enc Vitals Group     BP (!) 188/91     Pulse Rate (!) 113      Resp 20     Temp 99 F (37.2 C)     Temp Source Oral     SpO2 100 %     Weight 180 lb (81.6 kg)     Height '5\' 1"'$  (1.549 m)     Head Circumference      Peak Flow      Pain Score 5     Pain Loc      Pain Edu?      Excl. in [place]     Constitutional: Alert and oriented. Well appearing and in no acute distress. Eyes: Conjunctivae are normal.  Head: Atraumatic. Nose: No congestion/rhinnorhea. Mouth/Throat:  Mucous membranes are moist.  Oropharynx non-erythematous. Neck: No stridor.  Cardiovascular: Normal rate, regular rhythm. Grossly normal heart sounds.  Good peripheral circulation. Respiratory: Normal respiratory effort.  No retractions. Lungs CTAB. Gastrointestinal: Soft and nontender. No distention. No abdominal bruits. No CVA tenderness rectal: Stool is Hemoccult-positive. Musculoskeletal: No lower extremity tenderness nor edema.  No joint effusions. Neurologic:  Normal speech and language. No gross focal neurologic deficits are appreciated. No gait instability. Skin:  Skin is warm, dry and intact. No rash noted. Psychiatric: Mood and affect are normal. Speech and behavior are normal.  ____________________________________________   LABS (all labs ordered are listed, but only abnormal results are displayed)  Labs Reviewed  COMPREHENSIVE METABOLIC PANEL - Abnormal; Notable for the following components:      Result Value   Glucose, Bld 114 (*)    BUN 26 (*)    Creatinine, Ser 2.16 (*)    ALT 11 (*)    Alkaline Phosphatase 133 (*)    GFR calc non Af Amer 27 (*)    GFR calc Af Amer 31 (*)    All other components within normal limits  CBC WITH DIFFERENTIAL/PLATELET - Abnormal; Notable for the following components:   WBC 11.7 (*)    RDW 17.8 (*)    Neutro Abs 11.1 (*)    Lymphs Abs 0.2 (*)    All other components within normal limits  URINALYSIS, COMPLETE (UACMP) WITH MICROSCOPIC - Abnormal; Notable for the following components:   Color, Urine YELLOW (*)    APPearance  CLEAR (*)    Hgb urine dipstick SMALL (*)    Protein, ur 100 (*)    Squamous Epithelial / LPF 0-5 (*)    All other components within normal limits  C DIFFICILE QUICK SCREEN W PCR REFLEX  GASTROINTESTINAL PANEL BY PCR, STOOL (REPLACES STOOL CULTURE)  PREGNANCY, URINE  LIPASE, BLOOD  TROPONIN I   ____________________________________________  EKG  C new noted above ____________________________________________  RADIOLOGY  CT done without IV contrast due to the patient's renal function shows no acute pathology ____________________________________________   PROCEDURES  Procedure(s) performed:   Procedures  Critical Care performed:   ____________________________________________   INITIAL IMPRESSION / ASSESSMENT AND PLAN / ED COURSE discussed patient with Dr. Allen Norris gastroenterology. Patient's GFR is slightly better than it was at the end of last year. She is able tolerate by mouth as evidenced by her able really to drink the contrast. GI does not think she meets criteria for inpatient hospitalization and I agree. She is not having any more vomiting or diarrhea at this point. I will give her some antiemetics to go home with and she will follow-up with Dr. Allen Norris ----------------------------------------- 10:46 PM on 01/18/2017 -----------------------------------------  I was going to discharge the patient home but now her temperature is 102.2 and she is still tachycardic and I think may be the best thing to do in view of her history of ischemic colitis would be to watch her in the hospital. she has not had any more diarrhea since she's been in the ER.      ____________________________________________   FINAL CLINICAL IMPRESSION(S) / ED DIAGNOSES  Final diagnoses:  Nausea vomiting and diarrhea  Fever, unspecified fever cause     ED Discharge Orders        Ordered    promethazine (PHENERGAN) 25 MG tablet  Every 6 hours PRN     01/18/17 2233       Note:  This  document was prepared using  Dragon Armed forces training and education officer and may include unintentional dictation errors.    Nena Polio, MD 01/18/17 2233    Nena Polio, MD 01/18/17 2251    Nena Polio, MD 01/18/17 [phone]

## 2017-01-18 NOTE — Discharge Instructions (Signed)
use the Phenergan one pill 4 times a day as needed for nausea. Drink small amounts of clear liquids frequently tonight and tomorrow morning try the Bratt diet bananas rice applesauce toast dry crackers etc. until about lunchtime then try regular food. Return here for fever inability to keep down fluids return of diarrhea feeling lightheaded feeling sicker. Follow-up with Dr.Wohl the gastroenterologist. I spoke with him about U tonight.

## 2017-01-18 NOTE — ED Provider Notes (Signed)
EKG read and interpreted by me shows sinus tachycardia rate of 107 changes that are present have been present in the past there is nothing new that I can see. Normal axis   Nena Polio, MD 01/18/17 [phone]

## 2017-01-19 LAB — BASIC METABOLIC PANEL
Anion gap: 9 (ref 5–15)
BUN: 22 mg/dL — AB (ref 6–20)
CHLORIDE: 111 mmol/L (ref 101–111)
CO2: 18 mmol/L — AB (ref 22–32)
CREATININE: 2 mg/dL — AB (ref 0.44–1.00)
Calcium: 7.9 mg/dL — ABNORMAL LOW (ref 8.9–10.3)
GFR calc Af Amer: 34 mL/min — ABNORMAL LOW (ref >60)
GFR calc non Af Amer: 29 mL/min — ABNORMAL LOW (ref >60)
GLUCOSE: 93 mg/dL (ref 65–99)
Potassium: 3.7 mmol/L (ref 3.5–5.1)
Sodium: 138 mmol/L (ref 135–145)

## 2017-01-19 LAB — CBC
HCT: 30.9 % — ABNORMAL LOW (ref 35.0–47.0)
Hemoglobin: 10 g/dL — ABNORMAL LOW (ref 12.0–16.0)
MCH: 27 pg (ref 26.0–34.0)
MCHC: 32.4 g/dL (ref 32.0–36.0)
MCV: 83.4 fL (ref 80.0–100.0)
PLATELETS: 227 10*3/uL (ref 150–440)
RBC: 3.71 MIL/uL — ABNORMAL LOW (ref 3.80–5.20)
RDW: 17.6 % — AB (ref 11.5–14.5)
WBC: 7.4 10*3/uL (ref 3.6–11.0)

## 2017-01-19 LAB — LACTIC ACID, PLASMA
LACTIC ACID, VENOUS: 1.3 mmol/L (ref 0.5–1.9)
Lactic Acid, Venous: 2 mmol/L (ref 0.5–1.9)

## 2017-01-19 LAB — TROPONIN I
Troponin I: 0.07 ng/mL (ref <0.03)
Troponin I: 0.06 ng/mL (ref <0.03)
Troponin I: 0.05 ng/mL (ref <0.03)

## 2017-01-19 MED ORDER — VANCOMYCIN HCL IN DEXTROSE 1-5 GM/200ML-% IV SOLN
1000.0000 mg | INTRAVENOUS | Status: DC
  Administered 2017-01-19: 1000 mg via INTRAVENOUS
  Filled 2017-01-19: qty 200

## 2017-01-19 MED ORDER — CLONIDINE HCL 0.1 MG PO TABS
0.3000 mg | ORAL_TABLET | Freq: Three times a day (TID) | ORAL | Status: DC
  Administered 2017-01-19 – 2017-01-20 (×4): 0.3 mg via ORAL
  Filled 2017-01-19 (×4): qty 3

## 2017-01-19 MED ORDER — CIPROFLOXACIN IN D5W 400 MG/200ML IV SOLN
400.0000 mg | Freq: Two times a day (BID) | INTRAVENOUS | Status: DC
  Administered 2017-01-19 – 2017-01-20 (×2): 400 mg via INTRAVENOUS
  Filled 2017-01-19 (×3): qty 200

## 2017-01-19 MED ORDER — LORATADINE 10 MG PO TABS: 10.0000 mg | ORAL_TABLET | Freq: Every day | ORAL | Status: DC

## 2017-01-19 MED ORDER — ONDANSETRON HCL 4 MG/2ML IJ SOLN: 4.0000 mg | Freq: Four times a day (QID) | INTRAMUSCULAR | Status: DC | PRN

## 2017-01-19 MED ORDER — FUROSEMIDE 40 MG PO TABS
40.0000 mg | ORAL_TABLET | Freq: Every day | ORAL | Status: DC
  Administered 2017-01-19 – 2017-01-20 (×2): 40 mg via ORAL
  Filled 2017-01-19 (×2): qty 1

## 2017-01-19 MED ORDER — SALINE SPRAY 0.65 % NA SOLN
1.0000 | NASAL | Status: DC | PRN
  Filled 2017-01-19: qty 44

## 2017-01-19 MED ORDER — ONDANSETRON HCL 4 MG PO TABS: 4.0000 mg | ORAL_TABLET | Freq: Four times a day (QID) | ORAL | Status: DC | PRN

## 2017-01-19 MED ORDER — ACETAMINOPHEN 325 MG PO TABS
650.0000 mg | ORAL_TABLET | Freq: Once | ORAL | Status: AC | PRN
  Administered 2017-01-19: 650 mg via ORAL
  Filled 2017-01-19: qty 2

## 2017-01-19 MED ORDER — LABETALOL HCL 5 MG/ML IV SOLN
INTRAVENOUS | Status: AC
  Administered 2017-01-19: 10 mg via INTRAVENOUS
  Filled 2017-01-19: qty 4

## 2017-01-19 MED ORDER — CARVEDILOL 12.5 MG PO TABS
37.5000 mg | ORAL_TABLET | Freq: Two times a day (BID) | ORAL | Status: DC
  Administered 2017-01-19 (×2): 37.5 mg via ORAL
  Filled 2017-01-19 (×3): qty 1

## 2017-01-19 MED ORDER — HEPARIN SODIUM (PORCINE) 5000 UNIT/ML IJ SOLN
5000.0000 [IU] | Freq: Three times a day (TID) | INTRAMUSCULAR | Status: DC
  Administered 2017-01-19 – 2017-01-20 (×5): 5000 [IU] via SUBCUTANEOUS
  Filled 2017-01-19 (×5): qty 1

## 2017-01-19 MED ORDER — SODIUM CHLORIDE 0.9% FLUSH
3.0000 mL | Freq: Two times a day (BID) | INTRAVENOUS | Status: DC
  Administered 2017-01-19 – 2017-01-20 (×3): 3 mL via INTRAVENOUS

## 2017-01-19 MED ORDER — ACETAMINOPHEN 650 MG RE SUPP: 650.0000 mg | Freq: Four times a day (QID) | RECTAL | Status: DC | PRN

## 2017-01-19 MED ORDER — MOMETASONE FURO-FORMOTEROL FUM 100-5 MCG/ACT IN AERO
2.0000 | INHALATION_SPRAY | Freq: Two times a day (BID) | RESPIRATORY_TRACT | Status: DC
Start: 2017-01-19 — End: 2017-01-20
  Administered 2017-01-19 – 2017-01-20 (×3): 2 via RESPIRATORY_TRACT
  Filled 2017-01-19: qty 8.8

## 2017-01-19 MED ORDER — ACETAMINOPHEN 325 MG PO TABS: 650.0000 mg | ORAL_TABLET | Freq: Four times a day (QID) | ORAL | Status: DC | PRN

## 2017-01-19 MED ORDER — SODIUM CHLORIDE 0.9% FLUSH
3.0000 mL | INTRAVENOUS | Status: DC | PRN
  Administered 2017-01-19: 3 mL via INTRAVENOUS

## 2017-01-19 MED ORDER — LORATADINE 10 MG PO TABS: 10.0000 mg | ORAL_TABLET | Freq: Every day | ORAL | Status: DC | PRN

## 2017-01-19 MED ORDER — PANTOPRAZOLE SODIUM 40 MG PO TBEC
40.0000 mg | DELAYED_RELEASE_TABLET | Freq: Every day | ORAL | Status: DC
Start: 2017-01-19 — End: 2017-01-20
  Administered 2017-01-19 – 2017-01-20 (×2): 40 mg via ORAL
  Filled 2017-01-19 (×2): qty 1

## 2017-01-19 MED ORDER — AMLODIPINE BESYLATE 10 MG PO TABS
10.0000 mg | ORAL_TABLET | Freq: Every day | ORAL | Status: DC
  Administered 2017-01-19 – 2017-01-20 (×2): 10 mg via ORAL
  Filled 2017-01-19 (×2): qty 1

## 2017-01-19 MED ORDER — ASPIRIN EC 81 MG PO TBEC
81.0000 mg | DELAYED_RELEASE_TABLET | Freq: Every day | ORAL | Status: DC
  Administered 2017-01-19 – 2017-01-20 (×2): 81 mg via ORAL
  Filled 2017-01-19 (×2): qty 1

## 2017-01-19 MED ORDER — LABETALOL HCL 5 MG/ML IV SOLN
10.0000 mg | INTRAVENOUS | Status: DC | PRN
  Administered 2017-01-19: 10 mg via INTRAVENOUS

## 2017-01-19 MED ORDER — SODIUM CHLORIDE 0.9 % IV SOLN
INTRAVENOUS | Status: AC
  Administered 2017-01-19: 01:00:00 via INTRAVENOUS

## 2017-01-19 NOTE — Clinical Social Work Note (Signed)
CSW received consult that patient needs assistance with medications, case manager can assist with this, CSW to sign off, please reconsult if social work needs arise.  Lemelle Broom. [place], MSW, [place]  01/19/2017 9:33 AM

## 2017-01-19 NOTE — Care Management (Addendum)
Patient is followed by  [facility]  and in good standing

## 2017-01-19 NOTE — Progress Notes (Signed)
[place] [facility] [place] NAME: Gabrielle Schultz    MR#:  [id number]  DATE OF BIRTH:  03-01-1970  SUBJECTIVE:   Patient here due to fever, abdominal pain, N/V/Diarrhea.  She is afebrile now, diarrhea has resolved. No other acute events overnight. We'll start patient on a clear liquid diet.  REVIEW OF SYSTEMS:    Review of Systems  Constitutional: Negative for chills and fever.  HENT: Negative for congestion and tinnitus.   Eyes: Negative for blurred vision and double vision.  Respiratory: Negative for cough, shortness of breath and wheezing.   Cardiovascular: Negative for chest pain, orthopnea and PND.  Gastrointestinal: Negative for abdominal pain, diarrhea, nausea and vomiting.  Genitourinary: Negative for dysuria and hematuria.  Neurological: Negative for dizziness, sensory change and focal weakness.  All other systems reviewed and are negative.   Nutrition: Clear Liquid Tolerating Diet: yes Tolerating PT: Ambulatory  DRUG ALLERGIES:   Allergies  Allergen Reactions  . Coconut Oil Swelling  . Amoxicillin Rash  . Penicillins Itching and Other (See Comments)    Has patient had a PCN reaction causing immediate rash, facial/tongue/throat swelling, SOB or lightheadedness with hypotension: No Has patient had a PCN reaction causing severe rash involving mucus membranes or skin necrosis: No Has patient had a PCN reaction that required hospitalization: No Has patient had a PCN reaction occurring within the last 10 years: Yes If all of the above answers are "NO", then may proceed with Cephalosporin use.     VITALS:  Blood pressure (!) 142/53, pulse 64, temperature 99.5 F (37.5 C), temperature source Oral, resp. rate 17, height 5\' 1"  (1.549 m), weight 80.8 kg (178 lb 3.2 oz), last menstrual period 01/16/2017, SpO2 99 %, not currently breastfeeding.  PHYSICAL EXAMINATION:   Physical Exam  GENERAL:  47 y.o.-year-old patient lying in bed in no  acute distress.  EYES: Pupils equal, round, reactive to light and accommodation. No scleral icterus. Extraocular muscles intact.  HEENT: Head atraumatic, normocephalic. Oropharynx and nasopharynx clear.  NECK:  Supple, no jugular venous distention. No thyroid enlargement, no tenderness.  LUNGS: Normal breath sounds bilaterally, no wheezing, rales, rhonchi. No use of accessory muscles of respiration.  CARDIOVASCULAR: S1, S2 normal. No murmurs, rubs, or gallops.  ABDOMEN: Soft, nontender, nondistended. Bowel sounds present. No organomegaly or mass.  EXTREMITIES: No cyanosis, clubbing or edema b/l.    NEUROLOGIC: Cranial nerves II through XII are intact. No focal Motor or sensory deficits b/l.   PSYCHIATRIC: The patient is alert and oriented x 3.  SKIN: No obvious rash, lesion, or ulcer.    LABORATORY PANEL:   CBC Recent Labs  Lab 01/19/17 0132  WBC 7.4  HGB 10.0*  HCT 30.9*  PLT 227   ------------------------------------------------------------------------------------------------------------------  Chemistries  Recent Labs  Lab 01/18/17 1930 01/19/17 0132  NA 141 138  K 3.7 3.7  CL 108 111  CO2 23 18*  GLUCOSE 114* 93  BUN 26* 22*  CREATININE 2.16* 2.00*  CALCIUM 9.0 7.9*  AST 19  --   ALT 11*  --   ALKPHOS 133*  --   BILITOT 0.5  --    ------------------------------------------------------------------------------------------------------------------  Cardiac Enzymes Recent Labs  Lab 01/19/17 1111  TROPONINI 0.06*   ------------------------------------------------------------------------------------------------------------------  RADIOLOGY:  Ct Abdomen Pelvis Wo Contrast  Result Date: 01/18/2017 CLINICAL DATA:  Abdominal pain with emesis and diarrhea. EXAM: CT ABDOMEN AND PELVIS WITHOUT CONTRAST TECHNIQUE: Multidetector CT imaging of the abdomen and pelvis was performed following  the standard protocol without IV contrast. COMPARISON:  CT abdomen pelvis  07/20/2016, 06/25/2014, 10/19/2012. FINDINGS: Lower chest: No pulmonary nodules or pleural effusion. No visible pericardial effusion. Hepatobiliary: Normal hepatic contours and density. No visible biliary dilatation. Normal gallbladder. Pancreas: Normal contours without ductal dilatation. No peripancreatic fluid collection. Spleen: Normal. Adrenals/Urinary Tract: --Adrenal glands: Normal. --Right kidney/ureter: No hydronephrosis or perinephric stranding. No nephrolithiasis. No obstructing ureteral stones. --Left kidney/ureter: No hydronephrosis or perinephric stranding. No nephrolithiasis. No obstructing ureteral stones. --Urinary bladder: Unremarkable. Stomach/Bowel: --Stomach/Duodenum: No hiatal hernia or other gastric abnormality. Normal duodenal course and caliber. --Small bowel: No dilatation or inflammation. --Colon: No focal abnormality. --Appendix: Normal. Vascular/Lymphatic: Atherosclerotic calcification is present within the non-aneurysmal abdominal aorta, without hemodynamically significant stenosis. No abdominal or pelvic lymphadenopathy. Reproductive: Normal uterus and ovaries. Musculoskeletal. No bony spinal canal stenosis or focal osseous abnormality. Other: None. IMPRESSION: 1. No acute abdominopelvic abnormality. 2.  Aortic Atherosclerosis (ICD10-I70.0). Electronically Signed   By: Ulyses Jarred M.D.   On: 01/18/2017 21:24   Dg Chest Portable 1 View  Result Date: 01/18/2017 CLINICAL DATA:  Chest pain and fever EXAM: PORTABLE CHEST 1 VIEW COMPARISON:  08/28/2016 FINDINGS: Cardiomegaly. No consolidation or pleural effusion. No pneumothorax. IMPRESSION: Cardiomegaly.  Negative for edema or infiltrate. Electronically Signed   By: Donavan Foil M.D.   On: 01/18/2017 23:12     ASSESSMENT AND PLAN:   47 year old female with past medical history of hypertension, hyperlipidemia, history of previous CVA, chronic kidney disease stage III, history of previous colitis, per his history of syncope,  migraines who presented to the hospital due to fever, abdominal pain, nausea vomiting and diarrhea.  1. Systemic inflammatory response syndrome-patient presented with fever, tachycardia, elevated lactate. -Source was thought to be secondary to intra-abdominal and was given her symptoms. -Clinically improved overnight and has no nausea vomiting or diarrhea or fever.  2. Acute gastroenteritis-this was the cause of patient's nausea vomiting diarrhea and fever. Unclear if this is bacterial viral as patient has not been able to give [place] a stool sample. Gastrointestinal PCR and stool for C. difficile is pending. - Continue IV fluids, will place patient on clear liquid diet and advance as tolerated.  - cont. Empiric Cipro, Flagyl for now. CT abd/pelvis (-) for acute pathology.   3. Essential hypertension-continue carvedilol, Norvasc, clonidine.  4. COPD-no acute exacerbation-continue Dulera.  5. Hx of previous CVA - cont. ASA  6. GERD - cont. Protonix.    All the records are reviewed and case discussed with Care Management/Social Worker. Management plans discussed with the patient, family and they are in agreement.  CODE STATUS: Full code  DVT Prophylaxis: Hep SQ  TOTAL TIME TAKING CARE OF THIS PATIENT: 30 minutes.   POSSIBLE D/C IN 1-2 DAYS, DEPENDING ON CLINICAL CONDITION.   Henreitta Leber M.D on 01/19/2017 at 2:17 PM  Between 7am to 6pm - Pager - [phone]  After 6pm go to [email or web address] - Technical brewer [facility]  Office  [phone]  CC: Primary care physician; Boyce Medici, FNP

## 2017-01-19 NOTE — Progress Notes (Signed)
Patient arrived to 2A Room 241. Patient denies pain and all questions answered. Patient oriented to unit and use of call bell/room phone. Skin assessment completed with Lexi RN and skin intact. A&Ox4, VSS, and NSR/ST on verified tele-box #[id number]. Husband at bedside. Nursing staff will continue to monitor for any changes in patient status. Earleen Reaper, RN

## 2017-01-19 NOTE — Progress Notes (Signed)
CODE SEPSIS - PHARMACY COMMUNICATION  **Broad Spectrum Antibiotics should be administered within 1 hour of Sepsis diagnosis**  Time Code Sepsis Called/Page Received: No page fired  Antibiotics Ordered: vanc/zosyn -- zosyn d/c'd then levaquin/flagyl ordered  Time of 1st antibiotic administration: 0003  Additional action taken by pharmacy: N/A  If necessary, Name of Provider/Nurse Contacted: N/A  Tobie Lords ,PharmD Clinical Pharmacist  01/19/2017  1:30 AM

## 2017-01-19 NOTE — ED Notes (Signed)
Dr. Jannifer Franklin notified of elevated Troponin of 0.06 and BP of 204/79.

## 2017-01-19 NOTE — ED Notes (Signed)
Patient transported to 88

## 2017-01-19 NOTE — Progress Notes (Signed)
Abd is only slightly tender.  No stools this shift.  Nausea is improved.  No emesis.

## 2017-01-19 NOTE — Progress Notes (Signed)
Pharmacy Antibiotic Note  Gabrielle Schultz is a 47 y.o. female admitted on 01/18/2017 with sepsis.  Pharmacy has been consulted for vancomycin dosing.  Plan: Patient received vanc 1g IV x 1 in ED and levaquin 750 mg IV x 1 and flagyl 500 mg IV x 1 Will continue w/ vanc 1g IV q24h w/ 8 hour stack dose. Will draw vanc trough 1/19 @ 0700 prior to 4th dose. Patient is on flagyl 500 mg IV q8h; will address levaquin  Ke 0.03112  T1/2 24 hrs Goal trough 15 - 20 mcg/mL  Height: 5\' 1"  (154.9 cm) Weight: 178 lb 3.2 oz (80.8 kg) IBW/kg (Calculated) : 47.8  Temp (24hrs), Avg:100.5 F (38.1 C), Min:99 F (37.2 C), Max:102.2 F (39 C)  Recent Labs  Lab 01/13/17 1840 01/18/17 1930 01/18/17 2356  WBC  --  11.7*  --   CREATININE 2.00* 2.16*  --   LATICACIDVEN  --   --  1.3    Estimated Creatinine Clearance: 32 mL/min (A) (by C-G formula based on SCr of 2.16 mg/dL (H)).    Allergies  Allergen Reactions  . Coconut Oil Swelling  . Amoxicillin Rash  . Penicillins Itching and Other (See Comments)    Has patient had a PCN reaction causing immediate rash, facial/tongue/throat swelling, SOB or lightheadedness with hypotension: No Has patient had a PCN reaction causing severe rash involving mucus membranes or skin necrosis: No Has patient had a PCN reaction that required hospitalization: No Has patient had a PCN reaction occurring within the last 10 years: Yes If all of the above answers are "NO", then may proceed with Cephalosporin use.    Thank you for allowing pharmacy to be a part of this patient's care.  Tobie Lords, PharmD, BCPS Clinical Pharmacist 01/19/2017

## 2017-01-19 NOTE — Care Management Note (Signed)
Case Management Note  Patient Details  Name: ROSALITA CAREY MRN: [id number] Date of Birth: 1970/04/18  Subjective/Objective:                 Consult for medication needs.  Patient without insurance and out of blood pressure meds.  Thinks she has been seen at [facility].     Action/Plan:  Reached out to [facility] and [facility] to see if patient has been seen previously. Requested early notification of medications that will be needed at discahrge  Expected Discharge Date:                  Expected Discharge Plan:     In-House Referral:     Discharge planning Services     Post Acute Care Choice:    Choice offered to:     DME Arranged:    DME Agency:     HH Arranged:    HH Agency:     Status of Service:     If discussed at [employer], dates discussed:    Additional Comments:  Katrina Stack, RN 01/19/2017, 8:35 AM

## 2017-01-20 LAB — CBC
HCT: 29.3 % — ABNORMAL LOW (ref 35.0–47.0)
HEMOGLOBIN: 9.4 g/dL — AB (ref 12.0–16.0)
MCH: 27.2 pg (ref 26.0–34.0)
MCHC: 32.1 g/dL (ref 32.0–36.0)
MCV: 84.7 fL (ref 80.0–100.0)
Platelets: 201 10*3/uL (ref 150–440)
RBC: 3.46 MIL/uL — ABNORMAL LOW (ref 3.80–5.20)
RDW: 18 % — ABNORMAL HIGH (ref 11.5–14.5)
WBC: 3.3 10*3/uL — ABNORMAL LOW (ref 3.6–11.0)

## 2017-01-20 LAB — BASIC METABOLIC PANEL
Anion gap: 9 (ref 5–15)
BUN: 23 mg/dL — AB (ref 6–20)
CALCIUM: 8.5 mg/dL — AB (ref 8.9–10.3)
CO2: 19 mmol/L — AB (ref 22–32)
CREATININE: 2.29 mg/dL — AB (ref 0.44–1.00)
Chloride: 107 mmol/L (ref 101–111)
GFR calc Af Amer: 29 mL/min — ABNORMAL LOW (ref >60)
GFR, EST NON AFRICAN AMERICAN: 25 mL/min — AB (ref >60)
GLUCOSE: 109 mg/dL — AB (ref 65–99)
Potassium: 3.6 mmol/L (ref 3.5–5.1)
Sodium: 135 mmol/L (ref 135–145)

## 2017-01-20 LAB — HIV ANTIBODY (ROUTINE TESTING W REFLEX): HIV SCREEN 4TH GENERATION: NONREACTIVE

## 2017-01-20 MED ORDER — PROMETHAZINE HCL 25 MG PO TABS: 25.0000 mg | ORAL_TABLET | Freq: Four times a day (QID) | ORAL | 0 refills | Status: DC | PRN

## 2017-01-20 MED ORDER — CIPROFLOXACIN HCL 250 MG PO TABS: 250.0000 mg | ORAL_TABLET | Freq: Two times a day (BID) | ORAL | 0 refills | Status: AC

## 2017-01-20 MED ORDER — METRONIDAZOLE 500 MG PO TABS: 500.0000 mg | ORAL_TABLET | Freq: Three times a day (TID) | ORAL | 0 refills | Status: AC

## 2017-01-20 NOTE — Care Management (Signed)
Patient for discharge today.  discharge meds- phenergan, cipro and flagyl [facility].  Patient verbalizes understanding meds need to be picked up before clinic closes at 4:30p

## 2017-01-20 NOTE — Discharge Summary (Signed)
[place] at [place] NAME: Gabrielle Schultz    MR#:  [id number]  DATE OF BIRTH:  03-12-1970  DATE OF ADMISSION:  01/18/2017 ADMITTING PHYSICIAN: Lance Coon, MD  DATE OF DISCHARGE: 01/20/2017  1:00 PM  PRIMARY CARE PHYSICIAN: Boyce Medici, FNP    ADMISSION DIAGNOSIS:  Mild intermittent asthma without complication [B16.94] Nausea vomiting and diarrhea [R11.2, R19.7] Fever, unspecified fever cause [R50.9]  DISCHARGE DIAGNOSIS:  Principal Problem:   SIRS (systemic inflammatory response syndrome) (HCC) Active Problems:   Hypertension   CKD (chronic kidney disease), stage IV (HCC)   Abdominal pain   SECONDARY DIAGNOSIS:   Past Medical History:  Diagnosis Date  . Acid reflux   . Allergic rhinitis 06/26/2015  . Allergy   . Asthma   . Carpal tunnel syndrome    right hand  . Chronic kidney disease   . Colitis   . CVA (cerebral infarction)    2009, 2015  . History of syncope   . Hypertension 2008  . Low HDL (under 40) 06/26/2015  . MI (myocardial infarction) ([place])    2015 - patient wasnt told by cardiologist that she had MI but has had cardiac cath in past  . Migraines   . Poor circulation   . Renal insufficiency   . Stroke ([place])   . Syncope and collapse     HOSPITAL COURSE:   47 year old female with past medical history of hypertension, hyperlipidemia, history of previous CVA, chronic kidney disease stage III, history of previous colitis, per his history of syncope, migraines who presented to the hospital due to fever, abdominal pain, nausea vomiting and diarrhea.  1. Systemic inflammatory response syndrome-patient presented with fever, tachycardia, elevated lactate. -This was secondary to intra-abdominal illness. Patient has now been afebrile and hemodynamic is stable over the past 24 hours and her cultures have been negative. She is clinically asymptomatic now.  2. Acute gastroenteritis-this was the cause of patient's nausea  vomiting diarrhea and fever.  -This was likely viral in nature. Patient's gastrointestinal PCR was negative for C. difficile and also for any other pathology. Patient was empirically treated with IV ciprofloxacin and Flagyl is also being discharged on 5 more days of antibiotics. Her abdominal pain resolved, she denies any nausea vomiting and now tolerating a regular diet well without any exacerbation of her symptoms.  3. Essential hypertension- she will continue carvedilol, Norvasc, clonidine.  4. COPD-no acute exacerbation - she will cont. Her albuterol inhaler as needed.  5. Hx of previous CVA - she will cont. ASA  6. GERD - she will cont. Her Omeprazole.   DISCHARGE CONDITIONS:   Stable  CONSULTS OBTAINED:    DRUG ALLERGIES:   Allergies  Allergen Reactions  . Coconut Oil Swelling  . Amoxicillin Rash  . Penicillins Itching and Other (See Comments)    Has patient had a PCN reaction causing immediate rash, facial/tongue/throat swelling, SOB or lightheadedness with hypotension: No Has patient had a PCN reaction causing severe rash involving mucus membranes or skin necrosis: No Has patient had a PCN reaction that required hospitalization: No Has patient had a PCN reaction occurring within the last 10 years: Yes If all of the above answers are "NO", then may proceed with Cephalosporin use.     DISCHARGE MEDICATIONS:   Allergies as of 01/20/2017      Reactions   Coconut Oil Swelling   Amoxicillin Rash   Penicillins Itching, Other (See Comments)   Has patient had  a PCN reaction causing immediate rash, facial/tongue/throat swelling, SOB or lightheadedness with hypotension: No Has patient had a PCN reaction causing severe rash involving mucus membranes or skin necrosis: No Has patient had a PCN reaction that required hospitalization: No Has patient had a PCN reaction occurring within the last 10 years: Yes If all of the above answers are "NO", then may proceed with  Cephalosporin use.      Medication List    TAKE these medications   amLODipine 10 MG tablet Commonly known as:  NORVASC Take 1 tablet (10 mg total) by mouth daily.   aspirin 81 MG chewable tablet Chew 81 mg by mouth daily.   carvedilol 12.5 MG tablet Commonly known as:  COREG Take 37.5 mg by mouth 2 (two) times daily with a meal.   cetirizine 10 MG tablet Commonly known as:  ZYRTEC Take 1 tablet (10 mg total) by mouth daily.   ciprofloxacin 250 MG tablet Commonly known as:  CIPRO Take 1 tablet (250 mg total) by mouth 2 (two) times daily for 5 days.   cloNIDine 0.3 MG tablet Commonly known as:  CATAPRES Take 1 tablet (0.3 mg total) by mouth 3 (three) times daily.   fluticasone 50 MCG/ACT nasal spray Commonly known as:  FLONASE Place 1 spray into both nostrils 2 (two) times daily.   furosemide 40 MG tablet Commonly known as:  LASIX Take 1 tablet (40 mg total) by mouth daily.   loperamide 2 MG capsule Commonly known as:  IMODIUM Take 2 mg by mouth as needed for diarrhea or loose stools.   metroNIDAZOLE 500 MG tablet Commonly known as:  FLAGYL Take 1 tablet (500 mg total) by mouth 3 (three) times daily for 5 days.   mometasone 0.1 % cream Commonly known as:  ELOCON Apply 1 application topically daily.   omeprazole 20 MG capsule Commonly known as:  PRILOSEC Take 1 capsule (20 mg total) by mouth daily.   promethazine 25 MG tablet Commonly known as:  PHENERGAN Take 1 tablet (25 mg total) by mouth every 6 (six) hours as needed for nausea or vomiting.   ranitidine 150 MG tablet Commonly known as:  ZANTAC Take 150 mg by mouth 2 (two) times daily.   VENTOLIN HFA 108 (90 Base) MCG/ACT inhaler Generic drug:  albuterol INHALE 2 PUFFS EVERY FOUR HOURS AS NEEDED FOR COUGH AND WHEEZING.         DISCHARGE INSTRUCTIONS:   DIET:  Cardiac diet  DISCHARGE CONDITION:  Stable  ACTIVITY:  Activity as tolerated  OXYGEN:  Home Oxygen: No.   Oxygen Delivery:  room air  DISCHARGE LOCATION:  home   If you experience worsening of your admission symptoms, develop shortness of breath, life threatening emergency, suicidal or homicidal thoughts you must seek medical attention immediately by calling 911 or calling your MD immediately  if symptoms less severe.  You Must read complete instructions/literature along with all the possible adverse reactions/side effects for all the Medicines you take and that have been prescribed to you. Take any new Medicines after you have completely understood and accpet all the possible adverse reactions/side effects.   Please note  You were cared for by a hospitalist during your hospital stay. If you have any questions about your discharge medications or the care you received while you were in the hospital after you are discharged, you can call the unit and asked to speak with the hospitalist on call if the hospitalist that took care of you is not  available. Once you are discharged, your primary care physician will handle any further medical issues. Please note that NO REFILLS for any discharge medications will be authorized once you are discharged, as it is imperative that you return to your primary care physician (or establish a relationship with a primary care physician if you do not have one) for your aftercare needs so that they can reassess your need for medications and monitor your lab values.     Today   No abdominal pain, N/V diarrhea today. Tolerated regular diet well. Will d/c home today on oral abx.   VITAL SIGNS:  Blood pressure (!) 147/53, pulse (!) 48, temperature 98.4 F (36.9 C), temperature source Oral, resp. rate 14, height 5\' 1"  (1.549 m), weight 80.8 kg (178 lb 3.2 oz), last menstrual period 01/16/2017, SpO2 99 %, not currently breastfeeding.  I/O:    Intake/Output Summary (Last 24 hours) at 01/20/2017 1625 Last data filed at 01/20/2017 1017 Gross per 24 hour  Intake 720 ml  Output 900 ml  Net  -180 ml    PHYSICAL EXAMINATION:  GENERAL:  47 y.o.-year-old patient lying in the bed with no acute distress.  EYES: Pupils equal, round, reactive to light and accommodation. No scleral icterus. Extraocular muscles intact.  HEENT: Head atraumatic, normocephalic. Oropharynx and nasopharynx clear.  NECK:  Supple, no jugular venous distention. No thyroid enlargement, no tenderness.  LUNGS: Normal breath sounds bilaterally, no wheezing, rales,rhonchi. No use of accessory muscles of respiration.  CARDIOVASCULAR: S1, S2 normal. No murmurs, rubs, or gallops.  ABDOMEN: Soft, non-tender, non-distended. Bowel sounds present. No organomegaly or mass.  EXTREMITIES: No pedal edema, cyanosis, or clubbing.  NEUROLOGIC: Cranial nerves II through XII are intact. No focal motor or sensory defecits b/l.  PSYCHIATRIC: The patient is alert and oriented x 3.  SKIN: No obvious rash, lesion, or ulcer.   DATA REVIEW:   CBC Recent Labs  Lab 01/20/17 0616  WBC 3.3*  HGB 9.4*  HCT 29.3*  PLT 201    Chemistries  Recent Labs  Lab 01/18/17 1930  01/20/17 0616  NA 141   < > 135  K 3.7   < > 3.6  CL 108   < > 107  CO2 23   < > 19*  GLUCOSE 114*   < > 109*  BUN 26*   < > 23*  CREATININE 2.16*   < > 2.29*  CALCIUM 9.0   < > 8.5*  AST 19  --   --   ALT 11*  --   --   ALKPHOS 133*  --   --   BILITOT 0.5  --   --    < > = values in this interval not displayed.    Cardiac Enzymes Recent Labs  Lab 01/19/17 1654  TROPONINI 0.05*    Microbiology Results  Results for orders placed or performed during the hospital encounter of 01/18/17  Blood Culture (routine x 2)     Status: None (Preliminary result)   Collection Time: 01/18/17 10:40 PM  Result Value Ref Range Status   Specimen Description BLOOD LT [facility]  Final   Special Requests   Final    BOTTLES DRAWN AEROBIC AND ANAEROBIC Blood Culture results may not be optimal due to an excessive volume of blood received in culture bottles   Culture   Final     NO GROWTH 2 DAYS Performed at [facility], [address], [place], [place] [address]    Report Status  PENDING  Incomplete  Blood Culture (routine x 2)     Status: None (Preliminary result)   Collection Time: 01/18/17 10:46 PM  Result Value Ref Range Status   Specimen Description BLOOD RT [facility]  Final   Special Requests   Final    BOTTLES DRAWN AEROBIC AND ANAEROBIC Blood Culture adequate volume   Culture   Final    NO GROWTH 2 DAYS Performed at [facility], [address] [place],  [address]    Report Status PENDING  Incomplete    RADIOLOGY:  Ct Abdomen Pelvis Wo Contrast  Result Date: 01/18/2017 CLINICAL DATA:  Abdominal pain with emesis and diarrhea. EXAM: CT ABDOMEN AND PELVIS WITHOUT CONTRAST TECHNIQUE: Multidetector CT imaging of the abdomen and pelvis was performed following the standard protocol without IV contrast. COMPARISON:  CT abdomen pelvis 07/20/2016, 06/25/2014, 10/19/2012. FINDINGS: Lower chest: No pulmonary nodules or pleural effusion. No visible pericardial effusion. Hepatobiliary: Normal hepatic contours and density. No visible biliary dilatation. Normal gallbladder. Pancreas: Normal contours without ductal dilatation. No peripancreatic fluid collection. Spleen: Normal. Adrenals/Urinary Tract: --Adrenal glands: Normal. --Right kidney/ureter: No hydronephrosis or perinephric stranding. No nephrolithiasis. No obstructing ureteral stones. --Left kidney/ureter: No hydronephrosis or perinephric stranding. No nephrolithiasis. No obstructing ureteral stones. --Urinary bladder: Unremarkable. Stomach/Bowel: --Stomach/Duodenum: No hiatal hernia or other gastric abnormality. Normal duodenal course and caliber. --Small bowel: No dilatation or inflammation. --Colon: No focal abnormality. --Appendix: Normal. Vascular/Lymphatic: Atherosclerotic calcification is present within the non-aneurysmal abdominal aorta, without hemodynamically significant stenosis. No  abdominal or pelvic lymphadenopathy. Reproductive: Normal uterus and ovaries. Musculoskeletal. No bony spinal canal stenosis or focal osseous abnormality. Other: None. IMPRESSION: 1. No acute abdominopelvic abnormality. 2.  Aortic Atherosclerosis (ICD10-I70.0). Electronically Signed   By: Ulyses Jarred M.D.   On: 01/18/2017 21:24   Dg Chest Portable 1 View  Result Date: 01/18/2017 CLINICAL DATA:  Chest pain and fever EXAM: PORTABLE CHEST 1 VIEW COMPARISON:  08/28/2016 FINDINGS: Cardiomegaly. No consolidation or pleural effusion. No pneumothorax. IMPRESSION: Cardiomegaly.  Negative for edema or infiltrate. Electronically Signed   By: Donavan Foil M.D.   On: 01/18/2017 23:12      Management plans discussed with the patient, family and they are in agreement.  CODE STATUS:  Code Status History    Date Active Date Inactive Code Status Order ID Comments User Context   01/19/2017 01:09 01/20/2017 16:22 Full Code [id number]  Lance Coon, MD Inpatient   TOTAL TIME TAKING CARE OF THIS PATIENT: 40 minutes.    Henreitta Leber M.D on 01/20/2017 at 4:25 PM  Between 7am to 6pm - Pager - [phone]  After 6pm go to [email or web address] - Technical brewer [place] [facility]  Office  [phone]  CC: Primary care physician; Boyce Medici, FNP

## 2017-01-20 NOTE — Progress Notes (Signed)
Pt d/c to home today.  IV removed intact.  D/c paperwork printed and reviewed w/pt.  All medication questions and concerns reviewed and pt states understanding.  All Rx's given to patient. Pt requested to wheelchaired out for d/c.    

## 2017-01-23 LAB — CULTURE, BLOOD (ROUTINE X 2)
Culture: NO GROWTH
Culture: NO GROWTH
Special Requests: ADEQUATE

## 2017-01-31 ENCOUNTER — Ambulatory Visit: Payer: Medicaid Other | Admitting: Gastroenterology

## 2017-02-07 ENCOUNTER — Ambulatory Visit: Payer: Medicaid Other | Admitting: Gastroenterology

## 2017-02-11 ENCOUNTER — Ambulatory Visit: Payer: Medicaid Other | Admitting: Pharmacy Technician

## 2017-02-11 ENCOUNTER — Encounter (INDEPENDENT_AMBULATORY_CARE_PROVIDER_SITE_OTHER): Payer: Self-pay

## 2017-02-11 DIAGNOSIS — Z79899 Other long term (current) drug therapy: Secondary | ICD-10-CM

## 2017-02-11 NOTE — Progress Notes (Signed)
Completed [facility] application and contract.  Patient agreed to all terms of the [facility] contract.  Patient to provide 1 month of spouse's VAPOLIDC for 2019 and 2018 taxes.  Provided patient with community resource material based on her particular needs.    Advair Prescription Application completed with patient.  Will forward to [facility] once poi is provided.  Upon receipt of signed application from provider and proof of income from patient, Advair Prescription Application will be submitted to [facility]  [facility]

## 2017-02-14 ENCOUNTER — Other Ambulatory Visit: Payer: Self-pay

## 2017-02-14 DIAGNOSIS — I10 Essential (primary) hypertension: Secondary | ICD-10-CM

## 2017-02-14 DIAGNOSIS — J3089 Other allergic rhinitis: Secondary | ICD-10-CM

## 2017-02-14 MED ORDER — CETIRIZINE HCL 10 MG PO TABS: 10.0000 mg | ORAL_TABLET | Freq: Every day | ORAL | 0 refills | Status: DC

## 2017-02-14 MED ORDER — CLONIDINE HCL 0.3 MG PO TABS: 0.3000 mg | ORAL_TABLET | Freq: Three times a day (TID) | ORAL | 0 refills | Status: DC

## 2017-02-14 MED ORDER — AMLODIPINE BESYLATE 10 MG PO TABS: 10.0000 mg | ORAL_TABLET | Freq: Every day | ORAL | 0 refills | Status: DC

## 2017-02-14 MED ORDER — FUROSEMIDE 40 MG PO TABS: 40.0000 mg | ORAL_TABLET | Freq: Every day | ORAL | 0 refills | Status: DC

## 2017-02-17 ENCOUNTER — Ambulatory Visit: Payer: Medicaid Other

## 2017-02-24 ENCOUNTER — Other Ambulatory Visit: Payer: Self-pay

## 2017-02-24 ENCOUNTER — Emergency Department: Payer: Medicaid Other

## 2017-02-24 ENCOUNTER — Encounter: Payer: Self-pay | Admitting: Emergency Medicine

## 2017-02-24 ENCOUNTER — Ambulatory Visit: Payer: Medicaid Other | Admitting: Gastroenterology

## 2017-02-24 ENCOUNTER — Encounter: Payer: Self-pay | Admitting: *Deleted

## 2017-02-24 ENCOUNTER — Emergency Department
Admission: EM | Admit: 2017-02-24 | Discharge: 2017-02-24 | Disposition: A | Discharge status: Home / Self Care | Payer: Medicaid Other | Attending: Emergency Medicine | Admitting: Emergency Medicine

## 2017-02-24 DIAGNOSIS — Z8673 Personal history of transient ischemic attack (TIA), and cerebral infarction without residual deficits: Secondary | ICD-10-CM | POA: Insufficient documentation

## 2017-02-24 DIAGNOSIS — Z79899 Other long term (current) drug therapy: Secondary | ICD-10-CM | POA: Insufficient documentation

## 2017-02-24 DIAGNOSIS — Z87891 Personal history of nicotine dependence: Secondary | ICD-10-CM | POA: Diagnosis not present

## 2017-02-24 DIAGNOSIS — Z7982 Long term (current) use of aspirin: Secondary | ICD-10-CM | POA: Diagnosis not present

## 2017-02-24 DIAGNOSIS — R079 Chest pain, unspecified: Secondary | ICD-10-CM | POA: Insufficient documentation

## 2017-02-24 DIAGNOSIS — N189 Chronic kidney disease, unspecified: Secondary | ICD-10-CM | POA: Diagnosis not present

## 2017-02-24 DIAGNOSIS — J45909 Unspecified asthma, uncomplicated: Secondary | ICD-10-CM | POA: Diagnosis not present

## 2017-02-24 DIAGNOSIS — I129 Hypertensive chronic kidney disease with stage 1 through stage 4 chronic kidney disease, or unspecified chronic kidney disease: Secondary | ICD-10-CM | POA: Insufficient documentation

## 2017-02-24 LAB — BASIC METABOLIC PANEL
ANION GAP: 9 (ref 5–15)
BUN: 27 mg/dL — ABNORMAL HIGH (ref 6–20)
CO2: 23 mmol/L (ref 22–32)
Calcium: 9 mg/dL (ref 8.9–10.3)
Chloride: 107 mmol/L (ref 101–111)
Creatinine, Ser: 2.08 mg/dL — ABNORMAL HIGH (ref 0.44–1.00)
GFR calc Af Amer: 32 mL/min — ABNORMAL LOW (ref >60)
GFR, EST NON AFRICAN AMERICAN: 28 mL/min — AB (ref >60)
GLUCOSE: 110 mg/dL — AB (ref 65–99)
POTASSIUM: 3.9 mmol/L (ref 3.5–5.1)
Sodium: 139 mmol/L (ref 135–145)

## 2017-02-24 LAB — CBC
HEMATOCRIT: 34 % — AB (ref 35.0–47.0)
HEMOGLOBIN: 10.8 g/dL — AB (ref 12.0–16.0)
MCH: 26.6 pg (ref 26.0–34.0)
MCHC: 31.8 g/dL — AB (ref 32.0–36.0)
MCV: 83.6 fL (ref 80.0–100.0)
Platelets: 275 10*3/uL (ref 150–440)
RBC: 4.06 MIL/uL (ref 3.80–5.20)
RDW: 16.4 % — ABNORMAL HIGH (ref 11.5–14.5)
WBC: 7.5 10*3/uL (ref 3.6–11.0)

## 2017-02-24 LAB — TROPONIN I
TROPONIN I: 0.03 ng/mL — AB (ref <0.03)
Troponin I: 0.03 ng/mL (ref <0.03)

## 2017-02-24 MED ORDER — ASPIRIN 81 MG PO CHEW
243.0000 mg | CHEWABLE_TABLET | Freq: Once | ORAL | Status: DC
  Filled 2017-02-24: qty 3

## 2017-02-24 MED ORDER — GI COCKTAIL ~~LOC~~
30.0000 mL | Freq: Once | ORAL | Status: AC
  Administered 2017-02-24: 30 mL via ORAL
  Filled 2017-02-24: qty 30

## 2017-02-24 MED ORDER — ASPIRIN 81 MG PO CHEW
81.0000 mg | CHEWABLE_TABLET | Freq: Once | ORAL | Status: AC
  Administered 2017-02-24: 81 mg via ORAL

## 2017-02-24 NOTE — Discharge Instructions (Signed)
You are evaluated for chest discomfort, and although no certain cause was found, your exam and evaluation are overall reassuring in the emergency department today.  Return to emerge department immediately for any worsening or uncontrolled pain, trouble breathing, palpitations or dizziness passing out, weakness, numbness, or any other symptoms concerning to you.  I am recommending that you follow-up with cardiologist.  And I also would like you to follow-up with your primary care doctor.

## 2017-02-24 NOTE — ED Notes (Signed)
Patient transported to X-ray 

## 2017-02-24 NOTE — ED Triage Notes (Addendum)
Pt arrives via [facility] from home with complaints of intermittent central substernal chest pain that radiates to middle of back that began upon waking up today. Pt states she has hx of acid reflux but medication did not resolve pain today. Pt would "just like some medication and to go home."  Pt took three 81mg  aspirin this morning.

## 2017-02-24 NOTE — ED Provider Notes (Signed)
[facility] Emergency Department Provider Note ____________________________________________   I have reviewed the triage vital signs and the triage nursing note.  HISTORY  Chief Complaint Chest Pain   Historian Patient  HPI Gabrielle Schultz is a 47 y.o. female with a history of GERD, history of hypertension, denies history of coronary artery disease that she knows of, does take baby aspirin daily, presents having woke up this morning with central chest discomfort that seems to go through to her back.  Feels like prior acid ingestion, but she took medication it did seem to help.  No chest pain or shortness of breath although she did try her Ventolin inhaler.  Plan at worst was 8 out of 10, currently 6 out of 10.  No nausea or vomiting.     Past Medical History:  Diagnosis Date  . Acid reflux   . Allergic rhinitis 06/26/2015  . Allergy   . Asthma   . Carpal tunnel syndrome    right hand  . Chronic kidney disease   . Colitis   . CVA (cerebral infarction)    2009, 2015  . History of syncope   . Hypertension 2008  . Low HDL (under 40) 06/26/2015  . MI (myocardial infarction) ([place])    2015 - patient wasnt told by cardiologist that she had MI but has had cardiac cath in past  . Migraines   . Poor circulation   . Renal insufficiency   . Stroke ([place])   . Syncope and collapse     Patient Active Problem List   Diagnosis Date Noted  . SIRS (systemic inflammatory response syndrome) ([place]) 01/18/2017  . Abdominal pain 01/18/2017  . Chest pain 08/28/2016  . Edema of both feet 04/22/2016  . Abnormal TSH 04/22/2016  . Abdominal pain, right upper quadrant 12/02/2015  . Dermatitis 12/02/2015  . Urinary tract infection 10/28/2015  . Low HDL (under 40) 06/26/2015  . Allergic rhinitis 06/26/2015  . Tobacco abuse 06/26/2015  . Cerebral infarct ([place]) 11/21/2013  . CKD (chronic kidney disease), stage IV ([place]) 11/21/2013  . Hypertension 08/12/2011  . LVH (left  ventricular hypertrophy) 08/12/2011  . Chronic renal insufficiency 08/12/2011    Past Surgical History:  Procedure Laterality Date  . CESAREAN SECTION  1994   Dr. Ammie Dalton  . COLONOSCOPY WITH PROPOFOL N/A 06/26/2014   Procedure: COLONOSCOPY WITH PROPOFOL;  Surgeon: Josefine Class, MD;  Location: [facility] ENDOSCOPY;  Service: Endoscopy;  Laterality: N/A;  . DILATION AND CURETTAGE OF UTERUS  x2 for incomplete SABs    Prior to Admission medications   Medication Sig Start Date End Date Taking? Authorizing Provider  amLODipine (NORVASC) 10 MG tablet Take 1 tablet (10 mg total) by mouth daily. 02/14/17  Yes Azzie Glatter, FNP  aspirin 81 MG chewable tablet Chew 81 mg by mouth daily.   Yes [provider]  cetirizine (ZYRTEC) 10 MG tablet Take 1 tablet (10 mg total) by mouth daily. 02/14/17  Yes Azzie Glatter, FNP  cloNIDine (CATAPRES) 0.3 MG tablet Take 1 tablet (0.3 mg total) by mouth 3 (three) times daily. 02/14/17  Yes Azzie Glatter, FNP  fluticasone (FLONASE) 50 MCG/ACT nasal spray Place 1 spray into both nostrils 2 (two) times daily. 3/47/42  Yes Copland, Elmo Putt B, PA-C  furosemide (LASIX) 40 MG tablet Take 1 tablet (40 mg total) by mouth daily. 02/14/17  Yes Azzie Glatter, FNP  loperamide (IMODIUM) 2 MG capsule Take 2 mg by mouth as needed for  diarrhea or loose stools.   Yes [provider]  mometasone (ELOCON) 0.1 % cream Apply 1 application topically daily as needed.    Yes [provider]  omeprazole (PRILOSEC) 20 MG capsule Take 1 capsule (20 mg total) by mouth daily. 07/15/63  Yes Copland, Deirdre Evener, PA-C  promethazine (PHENERGAN) 25 MG tablet Take 1 tablet (25 mg total) by mouth every 6 (six) hours as needed for nausea or vomiting. 01/20/17  Yes Henreitta Leber, MD  ranitidine (ZANTAC) 150 MG tablet Take 150 mg by mouth 2 (two) times daily.   Yes [provider]  VENTOLIN HFA 108 (90 Base) MCG/ACT inhaler INHALE 2 PUFFS EVERY FOUR HOURS  AS NEEDED FOR COUGH AND WHEEZING. 12/16/15  Yes McGowan, Larene Beach A, PA-C    Allergies  Allergen Reactions  . Coconut Oil Swelling  . Amoxicillin Rash    Has patient had a PCN reaction causing immediate rash, facial/tongue/throat swelling, SOB or lightheadedness with hypotension: No Has patient had a PCN reaction causing severe rash involving mucus membranes or skin necrosis: No Has patient had a PCN reaction that required hospitalization: No Has patient had a PCN reaction occurring within the last 10 years: No If all of the above answers are "NO", then may proceed with Cephalosporin use.    Marland Kitchen Penicillins Itching and Other (See Comments)    Has patient had a PCN reaction causing immediate rash, facial/tongue/throat swelling, SOB or lightheadedness with hypotension: No Has patient had a PCN reaction causing severe rash involving mucus membranes or skin necrosis: No Has patient had a PCN reaction that required hospitalization: No Has patient had a PCN reaction occurring within the last 10 years: Yes If all of the above answers are "NO", then may proceed with Cephalosporin use.     Family History  Problem Relation Age of Onset  . Hyperlipidemia Mother   . Heart disease Mother        has a defibrillator  . COPD Mother   . Hypertension Father   . Heart disease Father   . Hyperlipidemia Father   . Hypertension Sister        died in her 51s from heart failure and end stage kidney disease  . Kidney disease Sister   . Hypertension Brother   . Heart disease Brother        has a defibrillator  . Coronary artery disease Brother        had CABG    Social History Social History   Tobacco Use  . Smoking status: Former Smoker    Packs/day: 0.25    Years: 18.00    Pack years: 4.50    Types: Cigarettes    Last attempt to quit: 08/01/2016    Years since quitting: 0.5  . Smokeless tobacco: Never Used  Substance Use Topics  . Alcohol use: Yes    Comment: "seldom" - drinks 350-799mL  when she does drink  . Drug use: No    Review of Systems  Constitutional: Negative for fever. Eyes: Negative for visual changes. ENT: Negative for sore throat. Cardiovascular: Positive for chest pain. Respiratory: Negative for shortness of breath. Gastrointestinal: Negative for abdominal pain, vomiting and diarrhea. Genitourinary: Negative for dysuria. Musculoskeletal: Negative for back pain. Skin: Negative for rash. Neurological: Negative for headache.  ____________________________________________   PHYSICAL EXAM:  VITAL SIGNS: ED Triage Vitals [02/24/17 0854]  Enc Vitals Group     BP (!) 115/50     Pulse Rate (!) 48  Resp 15     Temp      Temp src      SpO2 100 %     Weight 180 lb (81.6 kg)     Height 5\' 1"  (1.549 m)     Head Circumference      Peak Flow      Pain Score      Pain Loc      Pain Edu?      Excl. in [place]?      Constitutional: Alert and oriented. Well appearing and in no distress. HEENT   Head: Normocephalic and atraumatic.      Eyes: Conjunctivae are normal. Pupils equal and round.       Ears:         Nose: No congestion/rhinnorhea.   Mouth/Throat: Mucous membranes are moist.   Neck: No stridor. Cardiovascular/Chest: Normal rate, regular rhythm.  No murmurs, rubs, or gallops. Respiratory: Normal respiratory effort without tachypnea nor retractions. Breath sounds are clear and equal bilaterally. No wheezes/rales/rhonchi. Gastrointestinal: Soft. No distention, no guarding, no rebound. Nontender.    Genitourinary/rectal:Deferred Musculoskeletal: Nontender with normal range of motion in all extremities. No joint effusions.  No lower extremity tenderness.  No edema. Neurologic:  Normal speech and language. No gross or focal neurologic deficits are appreciated. Skin:  Skin is warm, dry and intact. No rash noted. Psychiatric: Mood and affect are normal. Speech and behavior are normal. Patient exhibits appropriate insight and  judgment.   ____________________________________________  LABS (pertinent positives/negatives) I, Lisa Roca, MD the attending physician have reviewed the labs noted below.  Labs Reviewed  BASIC METABOLIC PANEL - Abnormal; Notable for the following components:      Result Value   Glucose, Bld 110 (*)    BUN 27 (*)    Creatinine, Ser 2.08 (*)    GFR calc non Af Amer 28 (*)    GFR calc Af Amer 32 (*)    All other components within normal limits  CBC - Abnormal; Notable for the following components:   Hemoglobin 10.8 (*)    HCT 34.0 (*)    MCHC 31.8 (*)    RDW 16.4 (*)    All other components within normal limits  TROPONIN I - Abnormal; Notable for the following components:   Troponin I 0.03 (*)    All other components within normal limits  TROPONIN I - Abnormal; Notable for the following components:   Troponin I 0.03 (*)    All other components within normal limits  POC URINE PREG, ED    ____________________________________________    EKG I, Lisa Roca, MD, the attending physician have personally viewed and interpreted all ECGs.  46 bpm.  Sinus bradycardia.  Narrow QRS.  Normal axis.  Nonspecific ST and T wave ____________________________________________  RADIOLOGY   Reviewed and interpreted by me chest x-ray: No focal infiltrate or edema or pneumothorax.  Radiologist report:  IMPRESSION: Mild lower lobe atelectasis. No active cardiopulmonary disease. __________________________________________  PROCEDURES  Procedure(s) performed: None  Critical Care performed: None   ____________________________________________  ED COURSE / ASSESSMENT AND PLAN  Pertinent labs & imaging results that were available during my care of the patient were reviewed by me and considered in my medical decision making (see chart for details).   Nonspecific chest discomfort, on the one hand sound like it may be related to her known history of GERD.  EKG is reassuring.  Initial  troponin 0 0.03, looks like previously she had minimally elevated troponins in  the past.  I am going to send a repeat in a few hours.  DeviationNo ongoing respiratory complaints or any pleuritic chest pain, not concerned about PE at this point time.  No hypoxia.  No symptoms of the flu.   Evaluation around 1:00, no ongoing symptoms.  We discussed possibility of GERD gastritis or stomach ulcer although no evidence for bleeding stomach ulcer as possibly the most likely possibility.  Did discuss with her recommending outpatient follow-up with a cardiologist.  Discussed return precautions at length.  DIFFERENTIAL DIAGNOSIS: Differential diagnosis includes, but is not limited to, ACS, aortic dissection, pulmonary embolism, cardiac tamponade, pneumothorax, pneumonia, pericarditis, myocarditis, GI-related causes including esophagitis/gastritis, and musculoskeletal chest wall pain.    CONSULTATIONS:  None   Patient / Family / Caregiver informed of clinical course, medical decision-making process, and agree with plan.   I discussed return precautions, follow-up instructions, and discharge instructions with patient and/or family.  Discharge Instructions : You are evaluated for chest discomfort, and although no certain cause was found, your exam and evaluation are overall reassuring in the emergency department today.  Return to emerge department immediately for any worsening or uncontrolled pain, trouble breathing, palpitations or dizziness passing out, weakness, numbness, or any other symptoms concerning to you.  I am recommending that you follow-up with cardiologist.  And I also would like you to follow-up with your primary care doctor.    ___________________________________________   FINAL CLINICAL IMPRESSION(S) / ED DIAGNOSES   Final diagnoses:  Nonspecific chest pain  Chronic renal failure, unspecified CKD stage      ___________________________________________        Note:  This dictation was prepared with Dragon dictation. Any transcriptional errors that result from this process are unintentional    Lisa Roca, MD 02/24/17 1314

## 2017-03-03 ENCOUNTER — Other Ambulatory Visit: Payer: Self-pay | Admitting: Internal Medicine

## 2017-03-03 DIAGNOSIS — J3089 Other allergic rhinitis: Secondary | ICD-10-CM

## 2017-03-03 DIAGNOSIS — I10 Essential (primary) hypertension: Secondary | ICD-10-CM

## 2017-03-11 ENCOUNTER — Other Ambulatory Visit: Payer: Self-pay | Admitting: Internal Medicine

## 2017-03-25 ENCOUNTER — Telehealth: Payer: Self-pay | Admitting: Pharmacist

## 2017-03-25 NOTE — Telephone Encounter (Signed)
03/25/2017 3:18:33 PM - Advair 100/50 & Ventolin scripts  03/25/17 Aldona Bar brought me the patient chart, patient has brought all fianancial information, now can order Advair 100/50 & Ventolin HFA. I have printed scripts and sending to [facility] for provider to sign.Delos Haring

## 2017-04-08 ENCOUNTER — Telehealth: Payer: Self-pay | Admitting: Pharmacist

## 2017-04-08 NOTE — Telephone Encounter (Signed)
04/08/2017 2:10:04 PM - Ventolin HFA script  04/08/17 Sending script to [facility] for Ventolin HFA Inhale 2 puffs every 4 hours as needed for cough and wheezing for provider to sign.AJ   04/08/2017 2:23:05 PM - Advair 100/50 -[facility] renewal  04/08/17 I faxed [place] renewal for Advair 100/50 with patient W2 for income.Delos Haring

## 2017-04-22 ENCOUNTER — Other Ambulatory Visit: Payer: Self-pay | Admitting: Internal Medicine

## 2017-04-22 DIAGNOSIS — I10 Essential (primary) hypertension: Secondary | ICD-10-CM

## 2017-04-22 DIAGNOSIS — J3089 Other allergic rhinitis: Secondary | ICD-10-CM

## 2017-04-26 ENCOUNTER — Other Ambulatory Visit: Payer: Self-pay | Admitting: Internal Medicine

## 2017-04-26 DIAGNOSIS — I10 Essential (primary) hypertension: Secondary | ICD-10-CM

## 2017-04-27 ENCOUNTER — Other Ambulatory Visit: Payer: Self-pay

## 2017-04-27 DIAGNOSIS — K219 Gastro-esophageal reflux disease without esophagitis: Secondary | ICD-10-CM

## 2017-04-27 MED ORDER — OMEPRAZOLE 20 MG PO CPDR
20.0000 mg | DELAYED_RELEASE_CAPSULE | Freq: Every day | ORAL | 0 refills | Status: DC
Start: 2017-04-27 — End: 2017-05-25

## 2017-04-27 NOTE — Progress Notes (Signed)
Sent refill to pharmacy for omeprazole per pt request

## 2017-05-25 ENCOUNTER — Other Ambulatory Visit: Payer: Self-pay | Admitting: Adult Health Nurse Practitioner

## 2017-05-25 ENCOUNTER — Other Ambulatory Visit: Payer: Self-pay | Admitting: Internal Medicine

## 2017-05-25 DIAGNOSIS — I10 Essential (primary) hypertension: Secondary | ICD-10-CM

## 2017-05-25 DIAGNOSIS — J3089 Other allergic rhinitis: Secondary | ICD-10-CM

## 2017-05-25 DIAGNOSIS — K219 Gastro-esophageal reflux disease without esophagitis: Secondary | ICD-10-CM

## 2017-06-22 ENCOUNTER — Emergency Department
Admission: EM | Admit: 2017-06-22 | Discharge: 2017-06-22 | Disposition: A | Discharge status: Home / Self Care | Payer: Self-pay | Attending: Emergency Medicine | Admitting: Emergency Medicine

## 2017-06-22 ENCOUNTER — Emergency Department: Payer: Self-pay

## 2017-06-22 ENCOUNTER — Other Ambulatory Visit: Payer: Self-pay

## 2017-06-22 DIAGNOSIS — Z87891 Personal history of nicotine dependence: Secondary | ICD-10-CM | POA: Insufficient documentation

## 2017-06-22 DIAGNOSIS — N184 Chronic kidney disease, stage 4 (severe): Secondary | ICD-10-CM | POA: Insufficient documentation

## 2017-06-22 DIAGNOSIS — Z79899 Other long term (current) drug therapy: Secondary | ICD-10-CM | POA: Insufficient documentation

## 2017-06-22 DIAGNOSIS — R079 Chest pain, unspecified: Secondary | ICD-10-CM | POA: Insufficient documentation

## 2017-06-22 DIAGNOSIS — I129 Hypertensive chronic kidney disease with stage 1 through stage 4 chronic kidney disease, or unspecified chronic kidney disease: Secondary | ICD-10-CM | POA: Insufficient documentation

## 2017-06-22 DIAGNOSIS — J45909 Unspecified asthma, uncomplicated: Secondary | ICD-10-CM | POA: Insufficient documentation

## 2017-06-22 DIAGNOSIS — E876 Hypokalemia: Secondary | ICD-10-CM | POA: Insufficient documentation

## 2017-06-22 DIAGNOSIS — Z7982 Long term (current) use of aspirin: Secondary | ICD-10-CM | POA: Insufficient documentation

## 2017-06-22 LAB — CBC WITH DIFFERENTIAL/PLATELET
Basophils Absolute: 0.1 10*3/uL (ref 0–0.1)
Basophils Relative: 1 %
EOS ABS: 0.2 10*3/uL (ref 0–0.7)
EOS PCT: 2 %
HCT: 33.3 % — ABNORMAL LOW (ref 35.0–47.0)
Hemoglobin: 10.9 g/dL — ABNORMAL LOW (ref 12.0–16.0)
LYMPHS ABS: 1.3 10*3/uL (ref 1.0–3.6)
LYMPHS PCT: 13 %
MCH: 28 pg (ref 26.0–34.0)
MCHC: 32.7 g/dL (ref 32.0–36.0)
MCV: 85.6 fL (ref 80.0–100.0)
MONO ABS: 0.5 10*3/uL (ref 0.2–0.9)
MONOS PCT: 5 %
Neutro Abs: 7.7 10*3/uL — ABNORMAL HIGH (ref 1.4–6.5)
Neutrophils Relative %: 79 %
PLATELETS: 261 10*3/uL (ref 150–440)
RBC: 3.89 MIL/uL (ref 3.80–5.20)
RDW: 16.2 % — ABNORMAL HIGH (ref 11.5–14.5)
WBC: 9.7 10*3/uL (ref 3.6–11.0)

## 2017-06-22 LAB — COMPREHENSIVE METABOLIC PANEL
ALT: 10 U/L — ABNORMAL LOW (ref 14–54)
AST: 19 U/L (ref 15–41)
Albumin: 3.4 g/dL — ABNORMAL LOW (ref 3.5–5.0)
Alkaline Phosphatase: 140 U/L — ABNORMAL HIGH (ref 38–126)
Anion gap: 8 (ref 5–15)
BUN: 35 mg/dL — ABNORMAL HIGH (ref 6–20)
CALCIUM: 7.6 mg/dL — AB (ref 8.9–10.3)
CHLORIDE: 111 mmol/L (ref 101–111)
CO2: 19 mmol/L — AB (ref 22–32)
CREATININE: 2.02 mg/dL — AB (ref 0.44–1.00)
GFR calc non Af Amer: 29 mL/min — ABNORMAL LOW (ref >60)
GFR, EST AFRICAN AMERICAN: 33 mL/min — AB (ref >60)
Glucose, Bld: 108 mg/dL — ABNORMAL HIGH (ref 65–99)
Potassium: 3 mmol/L — ABNORMAL LOW (ref 3.5–5.1)
SODIUM: 138 mmol/L (ref 135–145)
Total Bilirubin: 0.3 mg/dL (ref 0.3–1.2)
Total Protein: 6.5 g/dL (ref 6.5–8.1)

## 2017-06-22 LAB — TROPONIN I: Troponin I: 0.03 ng/mL (ref <0.03)

## 2017-06-22 LAB — LIPASE, BLOOD: LIPASE: 34 U/L (ref 11–51)

## 2017-06-22 MED ORDER — POTASSIUM CHLORIDE CRYS ER 20 MEQ PO TBCR
40.0000 meq | EXTENDED_RELEASE_TABLET | Freq: Once | ORAL | Status: AC
  Administered 2017-06-22: 40 meq via ORAL
  Filled 2017-06-22: qty 2

## 2017-06-22 MED ORDER — POTASSIUM CHLORIDE CRYS ER 20 MEQ PO TBCR: 20.0000 meq | EXTENDED_RELEASE_TABLET | Freq: Every day | ORAL | 0 refills | Status: DC

## 2017-06-22 MED ORDER — ASPIRIN 81 MG PO CHEW
324.0000 mg | CHEWABLE_TABLET | Freq: Once | ORAL | Status: AC
  Administered 2017-06-22: 324 mg via ORAL
  Filled 2017-06-22: qty 4

## 2017-06-22 NOTE — Discharge Instructions (Signed)

## 2017-06-22 NOTE — ED Triage Notes (Signed)
Pt arrived via EMS from home d/t central chest pain that began around 0100. Pt denies any SOB or nausea at this time. Pt states she is no longer having the chest pain and that it comes and goes. Per EMS pt has hx of MI and CVA. Pt is NAD at this time.

## 2017-06-22 NOTE — ED Provider Notes (Signed)
[facility] Emergency Department Provider Note  ____________________________________________   First MD Initiated Contact with Patient 06/22/17 0320     (approximate)  I have reviewed the triage vital signs and the nursing notes.   HISTORY  Chief Complaint Chest Pain    HPI Gabrielle Schultz is a 47 y.o. female with medical history as listed below who presents by EMS for evaluation of acute onset chest pain.  She reports that she was in bed tonight at 1:00 in the morning when she had acute onset central chest pain that was sharp.  It was accompanied briefly with shortness of breath.  The symptoms resolved but they scared her.  They were severe initially and acute in onset and nothing in particular made it better or worse.  At this time she has no persistent chest pain but she states that in the past she has had chest pain that comes and goes for no apparent reason.  She denies fever/chills, shortness of breath, nausea, vomiting, abdominal pain, and dysuria.  She has been compliant with her medications.  She takes no anticoagulation other than a baby aspirin.  Past Medical History:  Diagnosis Date  . Acid reflux   . Allergic rhinitis 06/26/2015  . Allergy   . Asthma   . Carpal tunnel syndrome    right hand  . Chronic kidney disease   . Colitis   . CVA (cerebral infarction)    2009, 2015  . History of syncope   . Hypertension 2008  . Low HDL (under 40) 06/26/2015  . MI (myocardial infarction) ([place])    2015 - patient wasnt told by cardiologist that she had MI but has had cardiac cath in past  . Migraines   . Poor circulation   . Renal insufficiency   . Stroke ([place])   . Syncope and collapse     Patient Active Problem List   Diagnosis Date Noted  . SIRS (systemic inflammatory response syndrome) ([place]) 01/18/2017  . Abdominal pain 01/18/2017  . Chest pain 08/28/2016  . Edema of both feet 04/22/2016  . Abnormal TSH 04/22/2016  . Abdominal pain, right  upper quadrant 12/02/2015  . Dermatitis 12/02/2015  . Urinary tract infection 10/28/2015  . Low HDL (under 40) 06/26/2015  . Allergic rhinitis 06/26/2015  . Tobacco abuse 06/26/2015  . Cerebral infarct ([place]) 11/21/2013  . CKD (chronic kidney disease), stage IV ([place]) 11/21/2013  . Hypertension 08/12/2011  . LVH (left ventricular hypertrophy) 08/12/2011  . Chronic renal insufficiency 08/12/2011    Past Surgical History:  Procedure Laterality Date  . CESAREAN SECTION  1994   Dr. Ammie Dalton  . COLONOSCOPY WITH PROPOFOL N/A 06/26/2014   Procedure: COLONOSCOPY WITH PROPOFOL;  Surgeon: Josefine Class, MD;  Location: [facility] ENDOSCOPY;  Service: Endoscopy;  Laterality: N/A;  . DILATION AND CURETTAGE OF UTERUS  x2 for incomplete SABs    Prior to Admission medications   Medication Sig Start Date End Date Taking? Authorizing Provider  amLODipine (NORVASC) 10 MG tablet TAKE ONE TABLET BY MOUTH EVERY DAY 05/26/17   Doles-Johnson, Teah, NP  aspirin 81 MG chewable tablet Chew 81 mg by mouth daily.    [provider]  cetirizine (ZYRTEC) 10 MG tablet TAKE ONE TABLET BY MOUTH EVERY DAY 05/26/17   Doles-Johnson, Teah, NP  cloNIDine (CATAPRES) 0.3 MG tablet TAKE ONE TABLET BY MOUTH 3 TIMES A DAY 05/26/17   Doles-Johnson, Teah, NP  fluticasone (FLONASE) 50 MCG/ACT nasal spray Place 1 spray into  both nostrils 2 (two) times daily. 8/41/32   Copland, Elmo Putt B, PA-C  furosemide (LASIX) 40 MG tablet TAKE ONE TABLET BY MOUTH EVERY DAY 05/26/17   Doles-Johnson, Teah, NP  loperamide (IMODIUM) 2 MG capsule Take 2 mg by mouth as needed for diarrhea or loose stools.    [provider]  mometasone (ELOCON) 0.1 % cream Apply 1 application topically daily as needed.     [provider]  omeprazole (PRILOSEC) 20 MG capsule TAKE ONE CAPSULE BY MOUTH EVERY DAY 05/26/17   Doles-Johnson, Teah, NP  potassium chloride SA (KLOR-CON M20) 20 MEQ tablet Take 1 tablet (20 mEq total) by mouth daily. 06/22/17    Hinda Kehr, MD  promethazine (PHENERGAN) 25 MG tablet Take 1 tablet (25 mg total) by mouth every 6 (six) hours as needed for nausea or vomiting. 01/20/17   Henreitta Leber, MD  ranitidine (ZANTAC) 150 MG tablet Take 150 mg by mouth 2 (two) times daily.    [provider]  VENTOLIN HFA 108 (90 Base) MCG/ACT inhaler INHALE 2 PUFFS EVERY 4 HOURS AS NEEDED FOR COUGH AND WHEEZING. 03/11/17   Doles-Johnson, Teah, NP    Allergies Coconut oil; Amoxicillin; and Penicillins  Family History  Problem Relation Age of Onset  . Hyperlipidemia Mother   . Heart disease Mother        has a defibrillator  . COPD Mother   . Hypertension Father   . Heart disease Father   . Hyperlipidemia Father   . Hypertension Sister        died in her 19s from heart failure and end stage kidney disease  . Kidney disease Sister   . Hypertension Brother   . Heart disease Brother        has a defibrillator  . Coronary artery disease Brother        had CABG    Social History Social History   Tobacco Use  . Smoking status: Former Smoker    Packs/day: 0.25    Years: 18.00    Pack years: 4.50    Types: Cigarettes    Last attempt to quit: 08/01/2016    Years since quitting: 0.8  . Smokeless tobacco: Never Used  Substance Use Topics  . Alcohol use: Yes    Comment: "seldom" - drinks 350-749mL when she does drink  . Drug use: No    Review of Systems Constitutional: No fever/chills Eyes: No visual changes. ENT: No sore throat. Cardiovascular: Chest pain as described above Respiratory: Denies shortness of breath. Gastrointestinal: No abdominal pain.  No nausea, no vomiting.  No diarrhea.  No constipation. Genitourinary: Negative for dysuria. Musculoskeletal: Negative for neck pain.  Negative for back pain. Integumentary: Negative for rash. Neurological: Negative for headaches, focal weakness or numbness.   ____________________________________________   PHYSICAL EXAM:  VITAL SIGNS: ED Triage  Vitals  Enc Vitals Group     BP 06/22/17 0255 120/60     Pulse Rate 06/22/17 0255 (!) 59     Resp 06/22/17 0255 19     Temp 06/22/17 0255 98.4 F (36.9 C)     Temp Source 06/22/17 0255 Oral     SpO2 06/22/17 0255 100 %     Weight 06/22/17 0257 86.2 kg (190 lb)     Height 06/22/17 0257 1.549 m (5\' 1" )     Head Circumference --      Peak Flow --      Pain Score 06/22/17 0257 0     Pain  Loc --      Pain Edu? --      Excl. in [place]? --     Constitutional: Alert and oriented. Well appearing and in no acute distress. Eyes: Conjunctivae are normal.  Head: Atraumatic. Nose: No congestion/rhinnorhea. Mouth/Throat: Mucous membranes are moist. Neck: No stridor.  No meningeal signs.   Cardiovascular: Normal rate, regular rhythm. Good peripheral circulation. Grossly normal heart sounds.  Minimally reproducible anterior chest wall tenderness to palpation Respiratory: Normal respiratory effort.  No retractions. Lungs CTAB. Gastrointestinal: Soft and nontender. No distention.  Musculoskeletal: No lower extremity tenderness nor edema. No gross deformities of extremities. Neurologic:  Normal speech and language. No gross focal neurologic deficits are appreciated.  Skin:  Skin is warm, dry and intact. No rash noted. Psychiatric: Mood and affect are normal. Speech and behavior are normal.  ____________________________________________   LABS (all labs ordered are listed, but only abnormal results are displayed)  Labs Reviewed  CBC WITH DIFFERENTIAL/PLATELET - Abnormal; Notable for the following components:      Result Value   Hemoglobin 10.9 (*)    HCT 33.3 (*)    RDW 16.2 (*)    Neutro Abs 7.7 (*)    All other components within normal limits  COMPREHENSIVE METABOLIC PANEL - Abnormal; Notable for the following components:   Potassium 3.0 (*)    CO2 19 (*)    Glucose, Bld 108 (*)    BUN 35 (*)    Creatinine, Ser 2.02 (*)    Calcium 7.6 (*)    Albumin 3.4 (*)    ALT 10 (*)    Alkaline  Phosphatase 140 (*)    GFR calc non Af Amer 29 (*)    GFR calc Af Amer 33 (*)    All other components within normal limits  TROPONIN I - Abnormal; Notable for the following components:   Troponin I 0.03 (*)    All other components within normal limits  TROPONIN I  LIPASE, BLOOD   ____________________________________________  EKG  ED ECG REPORT I, Hinda Kehr, the attending physician, personally viewed and interpreted this ECG.  Date: 06/22/2017 EKG Time: 2:59 AM Rate: 54 Rhythm: Sinus bradycardia QRS Axis: normal Intervals: LVH with IVCD and secondary repolarization abnormality ST/T Wave abnormalities: Non-specific ST segment / T-wave changes, but no evidence of acute ischemia. Narrative Interpretation: no clear evidence of acute ischemia.  The EKG looks different than the last one on record, but similar to the one prior to that.    ____________________________________________  RADIOLOGY I, Hinda Kehr, personally viewed and evaluated these images (plain radiographs) as part of my medical decision making, as well as reviewing the written report by the radiologist.  ED MD interpretation: No evidence of acute infection or obvious intrathoracic abnormality  Official radiology report(s): Dg Chest 2 View  Result Date: 06/22/2017 CLINICAL DATA:  Central chest pain beginning at 0100 hours. History of heart disease. EXAM: CHEST - 2 VIEW COMPARISON:  02/24/2017 FINDINGS: There is linear scarring in the lower lungs bilaterally similar to previous study. Shallow inspiration. Heart size and pulmonary vascularity are normal for technique. No airspace disease or consolidation in the lungs. No blunting of costophrenic angles. No pneumothorax. Mediastinal contours appear intact. IMPRESSION: Linear scarring in the lower lungs. No change since prior study. No active consolidation. Electronically Signed   By: Lucienne Capers M.D.   On: 06/22/2017 03:50     ____________________________________________   PROCEDURES  Critical Care performed: No   Procedure(s) performed:   Procedures  ____________________________________________   INITIAL IMPRESSION / ASSESSMENT AND PLAN / ED COURSE  As part of my medical decision making, I reviewed the following data within the [place] notes reviewed and incorporated, Labs reviewed  EKG interpreted , Old EKG reviewed, Old chart reviewed, Radiograph reviewed  and Notes from prior ED visits    Differential diagnosis includes, but is not limited to, angina, ACS, PE, pneumothorax, pneumonia, musculoskeletal pain.  The patient does have episodes of chest pain and I reviewed her medical record that indicates some prior visits for chest pain.  She frequently has an elevated troponin at baseline.  She also has chronic kidney disease at baseline.  She has been compliant with her aspirin.  She is in no acute distress at this time and although the symptoms were acute in onset they resolved quickly as well.  Though her EKG is not completely normal it is similar to an EKG from the past and it seems that she does have some dynamic changes from time to time.  Given that she is in no distress and her initial work-up is reassuring (no acute abnormality on CXR, normal troponin, normal CBC, and conference of metabolic panel with baseline creatinine and only slightly decreased potassium which I will replete), I discussed with her the plan of checking a second troponin and then following up as an outpatient.  She agrees with this plan.  I will give her a full dose aspirin as well as potassium 40 mg once by mouth.  Clinical Course as of Jun 23 803  Wed Jun 22, 2017  0800 The patient's second troponin was 0.03.  However, as previously mentioned, she typically runs as high as 0.07.  Although this is a change from the original value of less than 0.03 it does not appear clinically significant,  particularly given that the patient has no ongoing symptoms.  I will update her and encourage close outpatient follow-up as previously described and give her my usual and customary return precautions.   [CF]  0801 I verified with patient she is no longer having any symptoms and is comfortable going home.   [CF]    Clinical Course User Index [CF] Hinda Kehr, MD    ____________________________________________  FINAL CLINICAL IMPRESSION(S) / ED DIAGNOSES  Final diagnoses:  Chest pain, unspecified type  Hypokalemia     MEDICATIONS GIVEN DURING THIS VISIT:  Medications  aspirin chewable tablet 324 mg (324 mg Oral Given 06/22/17 0644)  potassium chloride SA (K-DUR,KLOR-CON) CR tablet 40 mEq (40 mEq Oral Given 06/22/17 0644)     ED Discharge Orders        Ordered    potassium chloride SA (KLOR-CON M20) 20 MEQ tablet  Daily     06/22/17 0758       Note:  This document was prepared using Dragon voice recognition software and may include unintentional dictation errors.    Hinda Kehr, MD 06/22/17 [id number]

## 2017-06-28 ENCOUNTER — Other Ambulatory Visit: Payer: Self-pay | Admitting: Adult Health Nurse Practitioner

## 2017-06-28 DIAGNOSIS — J3089 Other allergic rhinitis: Secondary | ICD-10-CM

## 2017-06-28 DIAGNOSIS — I10 Essential (primary) hypertension: Secondary | ICD-10-CM

## 2017-07-26 ENCOUNTER — Other Ambulatory Visit: Payer: Self-pay | Admitting: Adult Health Nurse Practitioner

## 2017-07-26 DIAGNOSIS — J3089 Other allergic rhinitis: Secondary | ICD-10-CM

## 2017-07-26 DIAGNOSIS — I10 Essential (primary) hypertension: Secondary | ICD-10-CM

## 2017-08-02 ENCOUNTER — Ambulatory Visit: Payer: Medicaid Other | Admitting: Family Medicine

## 2017-08-02 VITALS — BP 117/74 | HR 64 | Temp 98.1°F | Ht 61.5 in | Wt 159.0 lb

## 2017-08-02 DIAGNOSIS — Z09 Encounter for follow-up examination after completed treatment for conditions other than malignant neoplasm: Secondary | ICD-10-CM

## 2017-08-02 DIAGNOSIS — I10 Essential (primary) hypertension: Secondary | ICD-10-CM

## 2017-08-02 DIAGNOSIS — E876 Hypokalemia: Secondary | ICD-10-CM

## 2017-08-02 DIAGNOSIS — L309 Dermatitis, unspecified: Secondary | ICD-10-CM

## 2017-08-02 DIAGNOSIS — Z889 Allergy status to unspecified drugs, medicaments and biological substances status: Secondary | ICD-10-CM

## 2017-08-02 MED ORDER — FLUTICASONE-SALMETEROL 100-50 MCG/DOSE IN AEPB: 1.0000 | INHALATION_SPRAY | Freq: Two times a day (BID) | RESPIRATORY_TRACT | 3 refills | Status: DC

## 2017-08-02 MED ORDER — MOMETASONE FUROATE 0.1 % EX CREA: 1.0000 "application " | TOPICAL_CREAM | Freq: Every day | CUTANEOUS | 2 refills | Status: DC | PRN

## 2017-08-02 MED ORDER — POTASSIUM CHLORIDE CRYS ER 20 MEQ PO TBCR: 20.0000 meq | EXTENDED_RELEASE_TABLET | Freq: Every day | ORAL | 1 refills | Status: DC

## 2017-08-02 NOTE — Progress Notes (Signed)
Hospital Follow Up  Subjective:    Patient ID: Gabrielle Schultz, female    DOB: 1970-06-18, 47 y.o.   MRN: [id number]   Chief Complaint  Patient presents with  . Follow-up    went to ED last month because of potassium drop; ED never sent K+ to medication management   HPI  Ms. Boran has past medical history of Syncope, Stroke, Renal Insufficiency, Migraines, MI, Hypertension, CVA, Colitis, Asthma, Allergy, Carpal Tunnel Syndrome, CKD, and Acid Reflux.   Current Status: Since her last office visit, she has had a visit to ED for Hypokalemia. She is now doing well with no complaints. Denies weakness, fatigue, muscle cramps, spasms, heart palpitations, stiffness, numbness, tingling, and mood changes. She does report occasional mild muscle aches in left calf.    She denies fevers, chills, fatigue, recent infections, weight loss, and night sweats. She has not had any headaches, visual changes, dizziness, and falls. No chest pain, cough and shortness of breath reported. No reports of GI problems such as nausea, vomiting, diarrhea, and constipation. She has no reports of blood in stools, dysuria and hematuria. No depression or anxiety. She denies pain today.   Past Medical History:  Diagnosis Date  . Acid reflux   . Allergic rhinitis 06/26/2015  . Allergy   . Asthma   . Carpal tunnel syndrome    right hand  . Chronic kidney disease   . Colitis   . CVA (cerebral infarction)    2009, 2015  . History of syncope   . Hypertension 2008  . Low HDL (under 40) 06/26/2015  . MI (myocardial infarction) ([place])    2015 - patient wasnt told by cardiologist that she had MI but has had cardiac cath in past  . Migraines   . Poor circulation   . Renal insufficiency   . Stroke ([place])   . Syncope and collapse     Family History  Problem Relation Age of Onset  . Hyperlipidemia Mother   . Heart disease Mother        has a defibrillator  . COPD Mother   . Hypertension Father   . Heart disease Father   .  Hyperlipidemia Father   . Hypertension Sister        died in her 62s from heart failure and end stage kidney disease  . Kidney disease Sister   . Hypertension Brother   . Heart disease Brother        has a defibrillator  . Coronary artery disease Brother        had CABG    Social History   Socioeconomic History  . Marital status: Married    Spouse name: Not on file  . Number of children: 1  . Years of education: Not on file  . Highest education level: Not on file  Occupational History  . Not on file  Social Needs  . Financial resource strain: Hard  . Food insecurity:    Worry: Sometimes true    Inability: Sometimes true  . Transportation needs:    Medical: Yes    Non-medical: Yes  Tobacco Use  . Smoking status: Current Every Day Smoker    Packs/day: 0.25    Years: 18.00    Pack years: 4.50    Types: Cigarettes    Start date: 03/04/2017  . Smokeless tobacco: Never Used  . Tobacco comment: pt quit in 07/2016 but restarted   Substance and Sexual Activity  . Alcohol use: Yes  Comment: "seldom" - drinks 350-747mL when she does drink  . Drug use: No  . Sexual activity: Yes    Partners: Male    Birth control/protection: None  Lifestyle  . Physical activity:    Days per week: 7 days    Minutes per session: 40 min  . Stress: Very much  Relationships  . Social connections:    Talks on phone: More than three times a week    Gets together: Never    Attends religious service: Never    Active member of club or organization: No    Attends meetings of clubs or organizations: Never    Relationship status: Married  . Intimate partner violence:    Fear of current or ex partner: No    Emotionally abused: No    Physically abused: No    Forced sexual activity: No  Other Topics Concern  . Not on file  Social History Narrative   Husband is a Administrator for [employer] and is the sole provider for the family including 2 children (51 yr old and 75 yr old)     Past  Surgical History:  Procedure Laterality Date  . CESAREAN SECTION  1994   Dr. Ammie Dalton  . COLONOSCOPY WITH PROPOFOL N/A 06/26/2014   Procedure: COLONOSCOPY WITH PROPOFOL;  Surgeon: Josefine Class, MD;  Location: [facility] ENDOSCOPY;  Service: Endoscopy;  Laterality: N/A;  . DILATION AND CURETTAGE OF UTERUS  x2 for incomplete SABs     There is no immunization history on file for this patient.  Current Meds  Medication Sig  . amLODipine (NORVASC) 10 MG tablet TAKE ONE TABLET BY MOUTH EVERY DAY  . aspirin 81 MG chewable tablet Chew 81 mg by mouth daily.  . cetirizine (ZYRTEC) 10 MG tablet TAKE ONE TABLET BY MOUTH EVERY DAY  . cloNIDine (CATAPRES) 0.3 MG tablet TAKE ONE TABLET BY MOUTH 3 TIMES A DAY  . fluticasone (FLONASE) 50 MCG/ACT nasal spray Place 1 spray into both nostrils 2 (two) times daily.  . Fluticasone-Salmeterol (ADVAIR) 100-50 MCG/DOSE AEPB Inhale 1 puff into the lungs 2 (two) times daily.  . furosemide (LASIX) 40 MG tablet TAKE ONE TABLET BY MOUTH EVERY DAY  . loperamide (IMODIUM) 2 MG capsule Take 2 mg by mouth as needed for diarrhea or loose stools.  . mometasone (ELOCON) 0.1 % cream Apply 1 application topically daily as needed.  Marland Kitchen omeprazole (PRILOSEC) 20 MG capsule TAKE ONE CAPSULE BY MOUTH EVERY DAY  . potassium chloride SA (KLOR-CON M20) 20 MEQ tablet Take 1 tablet (20 mEq total) by mouth daily.  . promethazine (PHENERGAN) 25 MG tablet Take 1 tablet (25 mg total) by mouth every 6 (six) hours as needed for nausea or vomiting.  . ranitidine (ZANTAC) 150 MG tablet Take 150 mg by mouth 2 (two) times daily.  . VENTOLIN HFA 108 (90 Base) MCG/ACT inhaler INHALE 2 PUFFS EVERY 4 HOURS AS NEEDED FOR COUGH AND WHEEZING.  . [DISCONTINUED] Fluticasone-Salmeterol (ADVAIR) 100-50 MCG/DOSE AEPB Inhale 1 puff into the lungs 2 (two) times daily.  . [DISCONTINUED] mometasone (ELOCON) 0.1 % cream Apply 1 application topically daily as needed.   . [DISCONTINUED] potassium chloride SA  (KLOR-CON M20) 20 MEQ tablet Take 1 tablet (20 mEq total) by mouth daily.    Allergies  Allergen Reactions  . Coconut Oil Swelling  . Amoxicillin Rash    Has patient had a PCN reaction causing immediate rash, facial/tongue/throat swelling, SOB or lightheadedness with hypotension: No Has patient  had a PCN reaction causing severe rash involving mucus membranes or skin necrosis: No Has patient had a PCN reaction that required hospitalization: No Has patient had a PCN reaction occurring within the last 10 years: No If all of the above answers are "NO", then may proceed with Cephalosporin use.    Marland Kitchen Penicillins Itching and Other (See Comments)    Has patient had a PCN reaction causing immediate rash, facial/tongue/throat swelling, SOB or lightheadedness with hypotension: No Has patient had a PCN reaction causing severe rash involving mucus membranes or skin necrosis: No Has patient had a PCN reaction that required hospitalization: No Has patient had a PCN reaction occurring within the last 10 years: Yes If all of the above answers are "NO", then may proceed with Cephalosporin use.     BP 117/74   Pulse 64   Temp 98.1 F (36.7 C) (Oral)   Ht 5' 1.5" (1.562 m)   Wt 159 lb (72.1 kg)   LMP 07/26/2017 (Approximate)   Breastfeeding? No   BMI 29.56 kg/m   Review of Systems  Constitutional: Negative.   Eyes: Negative.   Respiratory: Negative.   Cardiovascular: Negative.   Gastrointestinal: Negative.   Endocrine: Negative.   Genitourinary: Negative.   Musculoskeletal: Negative.   Skin: Positive for rash.       Eczema over arms and legs  Allergic/Immunologic: Negative.   Neurological: Negative.   Hematological: Negative.   Psychiatric/Behavioral: Negative.    Objective:   Physical Exam  Constitutional: She is oriented to person, place, and time. She appears well-developed and well-nourished.  Neck: Normal range of motion. Neck supple.  Cardiovascular: Normal rate and regular  rhythm.  Pulmonary/Chest: Effort normal and breath sounds normal.  Abdominal: Soft. Bowel sounds are normal. She exhibits distension (Obese).  Musculoskeletal: Normal range of motion.  Neurological: She is alert and oriented to person, place, and time.  Skin: Rash noted.  Psychiatric: She has a normal mood and affect. Her behavior is normal. Judgment and thought content normal.  Vitals reviewed.  Assessment & Plan:   1. Hypokalemia Potassium level decreased at 3.0 on 06/22/2017. She will continue Potassium daily as prescribed. We will assess CMET on 08/04/2017. Denies weakness, fatigue, muscle cramps, spasms, heart palpitations, muscle aches, stiffness, numbness, tingling, and mood changes.  - Comprehensive metabolic panel - potassium chloride SA (KLOR-CON M20) 20 MEQ tablet; Take 1 tablet (20 mEq total) by mouth daily.  Dispense: 30 tablet; Refill: 1  2. Essential hypertension Blood pressure is stable at 117/74 today. She will continue Amlodipine, Clonidine, and Lasix as prescribed.   She will continue to decrease high sodium intake, excessive alcohol intake, increase potassium intake, smoking cessation, and increase physical activity of at least 30 minutes of cardio activity daily. She will continue to follow Heart Healthy or DASH diet.  3. Dermatitis Skin rash on arms and legs. We will refill Elocon today.  - mometasone (ELOCON) 0.1 % cream; Apply 1 application topically daily as needed.  Dispense: 45 g; Refill: 2  4. History of seasonal allergies We will refill Advair today.  - Fluticasone-Salmeterol (ADVAIR) 100-50 MCG/DOSE AEPB; Inhale 1 puff into the lungs 2 (two) times daily.  Dispense: 1 each; Refill: 3  5. Follow up She will follow up in 1 month to re-assess Potassium level.   Meds ordered this encounter  Medications  . Fluticasone-Salmeterol (ADVAIR) 100-50 MCG/DOSE AEPB    Sig: Inhale 1 puff into the lungs 2 (two) times daily.    Dispense:  1 each    Refill:  3  .  potassium chloride SA (KLOR-CON M20) 20 MEQ tablet    Sig: Take 1 tablet (20 mEq total) by mouth daily.    Dispense:  30 tablet    Refill:  1  . mometasone (ELOCON) 0.1 % cream    Sig: Apply 1 application topically daily as needed.    Dispense:  45 g    Refill:  [place],  MSN, FNP-C [facility] [address] [place], [place] [address] [phone]

## 2017-08-03 ENCOUNTER — Encounter: Payer: Self-pay | Admitting: Licensed Clinical Social Worker

## 2017-08-04 ENCOUNTER — Other Ambulatory Visit: Payer: Medicaid Other

## 2017-08-05 LAB — COMPREHENSIVE METABOLIC PANEL
ALT: 5 IU/L (ref 0–32)
AST: 9 IU/L (ref 0–40)
Albumin/Globulin Ratio: 1.3 (ref 1.2–2.2)
Albumin: 3.9 g/dL (ref 3.5–5.5)
Alkaline Phosphatase: 161 IU/L — ABNORMAL HIGH (ref 39–117)
BUN/Creatinine Ratio: 13 (ref 9–23)
BUN: 27 mg/dL — ABNORMAL HIGH (ref 6–24)
Bilirubin Total: <0.2 mg/dL (ref 0.0–1.2)
CO2: 22 mmol/L (ref 20–29)
Calcium: 9.4 mg/dL (ref 8.7–10.2)
Chloride: 104 mmol/L (ref 96–106)
Creatinine, Ser: 2.04 mg/dL — ABNORMAL HIGH (ref 0.57–1.00)
GFR calc Af Amer: 33 mL/min/{1.73_m2} — ABNORMAL LOW (ref >59)
GFR calc non Af Amer: 29 mL/min/{1.73_m2} — ABNORMAL LOW (ref >59)
Globulin, Total: 2.9 g/dL (ref 1.5–4.5)
Glucose: 114 mg/dL — ABNORMAL HIGH (ref 65–99)
Potassium: 4.6 mmol/L (ref 3.5–5.2)
Sodium: 142 mmol/L (ref 134–144)
Total Protein: 6.8 g/dL (ref 6.0–8.5)

## 2017-08-24 ENCOUNTER — Other Ambulatory Visit: Payer: Self-pay | Admitting: Adult Health Nurse Practitioner

## 2017-08-24 DIAGNOSIS — I10 Essential (primary) hypertension: Secondary | ICD-10-CM

## 2017-08-24 DIAGNOSIS — K219 Gastro-esophageal reflux disease without esophagitis: Secondary | ICD-10-CM

## 2017-08-24 DIAGNOSIS — J3089 Other allergic rhinitis: Secondary | ICD-10-CM

## 2017-09-08 ENCOUNTER — Ambulatory Visit: Payer: Medicaid Other | Admitting: Family Medicine

## 2017-09-08 VITALS — BP 114/78 | HR 67 | Temp 98.0°F | Ht 60.0 in | Wt 159.5 lb

## 2017-09-08 DIAGNOSIS — N184 Chronic kidney disease, stage 4 (severe): Secondary | ICD-10-CM

## 2017-09-08 DIAGNOSIS — E876 Hypokalemia: Secondary | ICD-10-CM

## 2017-09-08 DIAGNOSIS — J3089 Other allergic rhinitis: Secondary | ICD-10-CM

## 2017-09-08 DIAGNOSIS — M25561 Pain in right knee: Secondary | ICD-10-CM

## 2017-09-08 MED ORDER — RANITIDINE HCL 150 MG PO TABS: 150.0000 mg | ORAL_TABLET | Freq: Two times a day (BID) | ORAL | 3 refills | Status: DC

## 2017-09-08 MED ORDER — POTASSIUM CHLORIDE CRYS ER 20 MEQ PO TBCR: 20.0000 meq | EXTENDED_RELEASE_TABLET | Freq: Every day | ORAL | 1 refills | Status: DC

## 2017-09-08 MED ORDER — MONTELUKAST SODIUM 10 MG PO TABS: 10.0000 mg | ORAL_TABLET | Freq: Every day | ORAL | 3 refills | Status: DC

## 2017-09-08 MED ORDER — ALBUTEROL SULFATE HFA 108 (90 BASE) MCG/ACT IN AERS
INHALATION_SPRAY | RESPIRATORY_TRACT | 1 refills | Status: DC
Start: 2017-09-08 — End: 2018-12-07

## 2017-09-08 MED ORDER — FLUTICASONE PROPIONATE 50 MCG/ACT NA SUSP: 1.0000 | Freq: Two times a day (BID) | NASAL | 2 refills | Status: DC

## 2017-09-08 NOTE — Progress Notes (Signed)
Primary Care Progress Note  Patient: Gabrielle Schultz Female    DOB: 19-Nov-1970   47 y.o.   MRN: [id number] Visit Date: 09/08/2017  Today's Provider: Lavon Paganini   Chief Complaint  Patient presents with  . Follow-up  . Leg Pain    R leg gives out when walking  . Allergies   Subjective:    HPI    Patient is concerned about right leg pain that is been present for about 1 month.  She thinks this has been exacerbated by having to chase after her baby more recently as she has become more mobile.  She noticed that the leg seems to want to give out with pain when she is walking around [place].  She states that this pain initially started when she was hospitalized for about 3 weeks after the birth of her daughter about 1 year ago.  She has not tried any medications.  Muscle rubs help a little.  Pain is mostly in the right knee-all over.  Patient is also here to follow-up on her hypokalemia.  She is taking Lasix with good compliance.  She was previously prescribed potassium supplement, but she was unaware of this and has not been taking it.  Her potassium was last checked on 08/04/2017 and it was 4.6.  She states she think this was elevated as she was eating a lot of high potassium foods when she was told that it was low.  She has cut back on these, so she thinks that her potassium will be low again.  Patient also has known CKD stage IV.  She does not see nephrology  Allergic rhinitis:  Taking Zyrtec and flonase with good compliance.  She needs refills on these medications.  Allergies  Allergen Reactions  . Coconut Oil Swelling  . Amoxicillin Rash    Has patient had a PCN reaction causing immediate rash, facial/tongue/throat swelling, SOB or lightheadedness with hypotension: No Has patient had a PCN reaction causing severe rash involving mucus membranes or skin necrosis: No Has patient had a PCN reaction that required hospitalization: No Has patient had a PCN reaction occurring within  the last 10 years: No If all of the above answers are "NO", then may proceed with Cephalosporin use.    Marland Kitchen Penicillins Itching and Other (See Comments)    Has patient had a PCN reaction causing immediate rash, facial/tongue/throat swelling, SOB or lightheadedness with hypotension: No Has patient had a PCN reaction causing severe rash involving mucus membranes or skin necrosis: No Has patient had a PCN reaction that required hospitalization: No Has patient had a PCN reaction occurring within the last 10 years: Yes If all of the above answers are "NO", then may proceed with Cephalosporin use.     Current Outpatient Medications:  .  amLODipine (NORVASC) 10 MG tablet, TAKE ONE TABLET BY MOUTH EVERY DAY, Disp: 30 tablet, Rfl: 0 .  aspirin 81 MG chewable tablet, Chew 81 mg by mouth daily., Disp: , Rfl:  .  cetirizine (ZYRTEC) 10 MG tablet, TAKE ONE TABLET BY MOUTH EVERY DAY, Disp: 30 tablet, Rfl: 0 .  cloNIDine (CATAPRES) 0.3 MG tablet, TAKE ONE TABLET BY MOUTH 3 TIMES A DAY, Disp: 90 tablet, Rfl: 0 .  fluticasone (FLONASE) 50 MCG/ACT nasal spray, Place 1 spray into both nostrils 2 (two) times daily., Disp: 16 g, Rfl: 2 .  Fluticasone-Salmeterol (ADVAIR) 100-50 MCG/DOSE AEPB, Inhale 1 puff into the lungs 2 (two) times daily., Disp: 1 each, Rfl: 3 .  furosemide (LASIX) 40 MG tablet, TAKE ONE TABLET BY MOUTH EVERY DAY, Disp: 30 tablet, Rfl: 0 .  loperamide (IMODIUM) 2 MG capsule, Take 2 mg by mouth as needed for diarrhea or loose stools., Disp: , Rfl:  .  mometasone (ELOCON) 0.1 % cream, Apply 1 application topically daily as needed., Disp: 45 g, Rfl: 2 .  omeprazole (PRILOSEC) 20 MG capsule, TAKE ONE CAPSULE BY MOUTH EVERY DAY, Disp: 90 capsule, Rfl: 0 .  potassium chloride SA (KLOR-CON M20) 20 MEQ tablet, Take 1 tablet (20 mEq total) by mouth daily., Disp: 30 tablet, Rfl: 1 .  promethazine (PHENERGAN) 25 MG tablet, Take 1 tablet (25 mg total) by mouth every 6 (six) hours as needed for nausea or  vomiting., Disp: 30 tablet, Rfl: 0 .  ranitidine (ZANTAC) 150 MG tablet, Take 150 mg by mouth 2 (two) times daily., Disp: , Rfl:  .  VENTOLIN HFA 108 (90 Base) MCG/ACT inhaler, INHALE 2 PUFFS EVERY 4 HOURS AS NEEDED FOR COUGH AND WHEEZING., Disp: 54 g, Rfl: 0  Review of Systems  Constitutional: Negative.   HENT: Negative.   Respiratory: Negative.   Cardiovascular: Negative.   Endocrine: Negative.   Genitourinary: Negative.   Musculoskeletal: Positive for arthralgias, gait problem and myalgias. Negative for back pain and joint swelling.  Skin: Negative.   Neurological: Negative for dizziness, tremors, seizures, syncope, facial asymmetry, speech difficulty, weakness, light-headedness, numbness and headaches.  Psychiatric/Behavioral: Negative.     Social History   Tobacco Use  . Smoking status: Current Every Day Smoker    Packs/day: 0.25    Years: 18.00    Pack years: 4.50    Types: Cigarettes    Start date: 03/04/2017  . Smokeless tobacco: Never Used  . Tobacco comment: pt quit in 07/2016 but restarted   Substance Use Topics  . Alcohol use: Yes    Comment: "seldom" - drinks 350-734mL when she does drink   Objective:   BP 114/78   Pulse 67   Temp 98 F (36.7 C)   Ht 5' (1.524 m)   Wt 159 lb 8 oz (72.3 kg)   LMP 09/05/2017   BMI 31.15 kg/m   Physical Exam  Constitutional: She is oriented to person, place, and time. She appears well-developed and well-nourished. No distress.  HENT:  Head: Normocephalic and atraumatic.  Right Ear: External ear normal.  Left Ear: External ear normal.  Nose: Nose normal.  Mouth/Throat: Oropharynx is clear and moist.  Eyes: Conjunctivae are normal. No scleral icterus.  Neck: Neck supple. No thyromegaly present.  Cardiovascular: Normal rate, regular rhythm, normal heart sounds and intact distal pulses.  No murmur heard. Pulmonary/Chest: Effort normal and breath sounds normal. No respiratory distress. She has no wheezes. She has no rales.    Musculoskeletal: She exhibits no edema or deformity.  Right knee: Range of motion is intact.  Positive crepitus.  No ligamental laxity with varus and valgus stress.  No tenderness to palpation over the joint line, patellar tendon, or quadriceps tendon.  Some tenderness to palpation in the popliteal fossa, but no palpable Baker's cyst.  No visible effusion or swelling.  Lymphadenopathy:    She has no cervical adenopathy.  Neurological: She is alert and oriented to person, place, and time.  Skin: Skin is warm and dry. Capillary refill takes less than 2 seconds. No rash noted.  Psychiatric: She has a normal mood and affect. Her behavior is normal.  Vitals reviewed.       Assessment &  Plan:   1. Hypokalemia -Chronic problem - Last reading was well controlled, but seems patient had given herself supplements with dietary intake prior to the last test -We will recheck today -I will prescribe potassium supplement, but if potassium is normal on next check, she may not need this - potassium chloride SA (KLOR-CON M20) 20 MEQ tablet; Take 1 tablet (20 mEq total) by mouth daily.  Dispense: 30 tablet; Refill: 1 - Basic Metabolic Panel (BMET)  2. Allergic rhinitis due to other allergic trigger, unspecified seasonality -Chronic, well controlled, asymptomatic -Continue antihistamine and Flonase -Meds were refilled at this visit  3. CKD (chronic kidney disease), stage IV ([place]) -Need to monitor creatinine, BMP will be checked today - Basic Metabolic Panel (BMET)  4. Acute pain of right knee -New problem -Seems likely related to osteoarthritis of the knee -This is exacerbated by weak quadricep muscles as her right side of her body is weaker than her left side after history of stroke -Given home exercise program to help with quadricep strengthening and range of motion of the knee - Can use Tylenol as needed    Meds ordered this encounter  Medications  . potassium chloride SA (KLOR-CON M20) 20  MEQ tablet    Sig: Take 1 tablet (20 mEq total) by mouth daily.    Dispense:  30 tablet    Refill:  1  . ranitidine (ZANTAC) 150 MG tablet    Sig: Take 1 tablet (150 mg total) by mouth 2 (two) times daily.    Dispense:  60 tablet    Refill:  3  . fluticasone (FLONASE) 50 MCG/ACT nasal spray    Sig: Place 1 spray into both nostrils 2 (two) times daily.    Dispense:  16 g    Refill:  2  . albuterol (VENTOLIN HFA) 108 (90 Base) MCG/ACT inhaler    Sig: INHALE 2 PUFFS EVERY 4 HOURS AS NEEDED FOR COUGH AND WHEEZING.    Dispense:  54 g    Refill:  1  . montelukast (SINGULAIR) 10 MG tablet    Sig: Take 1 tablet (10 mg total) by mouth at bedtime.    Dispense:  30 tablet    Refill:  3     Return in about 4 weeks (around 10/06/2017) for Potassium and allergies f/u.   Lavon Paganini, MD MPH

## 2017-09-08 NOTE — Patient Instructions (Signed)

## 2017-09-09 LAB — BASIC METABOLIC PANEL
BUN / CREAT RATIO: 14 (ref 9–23)
BUN: 27 mg/dL — ABNORMAL HIGH (ref 6–24)
CO2: 21 mmol/L (ref 20–29)
CREATININE: 1.93 mg/dL — AB (ref 0.57–1.00)
Calcium: 9.5 mg/dL (ref 8.7–10.2)
Chloride: 103 mmol/L (ref 96–106)
GFR calc Af Amer: 36 mL/min/{1.73_m2} — ABNORMAL LOW (ref >59)
GFR, EST NON AFRICAN AMERICAN: 31 mL/min/{1.73_m2} — AB (ref >59)
GLUCOSE: 132 mg/dL — AB (ref 65–99)
POTASSIUM: 4.5 mmol/L (ref 3.5–5.2)
SODIUM: 140 mmol/L (ref 134–144)

## 2017-09-22 ENCOUNTER — Other Ambulatory Visit: Payer: Self-pay | Admitting: Adult Health Nurse Practitioner

## 2017-09-22 DIAGNOSIS — J3089 Other allergic rhinitis: Secondary | ICD-10-CM

## 2017-09-22 DIAGNOSIS — I10 Essential (primary) hypertension: Secondary | ICD-10-CM

## 2017-10-06 ENCOUNTER — Ambulatory Visit: Payer: Medicaid Other | Admitting: Family Medicine

## 2017-10-06 VITALS — BP 118/69 | HR 54 | Temp 98.5°F | Ht 60.0 in | Wt 156.4 lb

## 2017-10-06 DIAGNOSIS — Z720 Tobacco use: Secondary | ICD-10-CM

## 2017-10-06 DIAGNOSIS — I10 Essential (primary) hypertension: Secondary | ICD-10-CM

## 2017-10-06 DIAGNOSIS — R350 Frequency of micturition: Secondary | ICD-10-CM

## 2017-10-06 DIAGNOSIS — J3089 Other allergic rhinitis: Secondary | ICD-10-CM

## 2017-10-06 DIAGNOSIS — N184 Chronic kidney disease, stage 4 (severe): Secondary | ICD-10-CM

## 2017-10-06 NOTE — Progress Notes (Signed)
Subjective:    Patient ID: Gabrielle Schultz, female    DOB: [birth year], 47 y.o.   MRN: [id number]  HPI Patient Active Problem List   Diagnosis Date Noted  . SIRS (systemic inflammatory response syndrome) ([place]) 01/18/2017  . Abdominal pain 01/18/2017  . Chest pain 08/28/2016  . Edema of both feet 04/22/2016  . Abnormal TSH 04/22/2016  . Abdominal pain, right upper quadrant 12/02/2015  . Dermatitis 12/02/2015  . Urinary tract infection 10/28/2015  . Low HDL (under 40) 06/26/2015  . Allergic rhinitis 06/26/2015  . Tobacco abuse 06/26/2015  . Cerebral infarct ([place]) 11/21/2013  . CKD (chronic kidney disease), stage IV ([place]) 11/21/2013  . Hypertension 08/12/2011  . LVH (left ventricular hypertrophy) 08/12/2011  . Chronic renal insufficiency 08/12/2011      Review of Systems    itching, rash all over, burns when urinates Objective:   Physical Exam  Constitutional: She is oriented to person, place, and time. She appears well-developed and well-nourished.  HENT:  Head: Normocephalic and atraumatic.  Cardiovascular: Normal rate and regular rhythm.  Pulmonary/Chest: Effort normal. She has wheezes.  Mild wheezes in both bases  Neurological: She is alert and oriented to person, place, and time.  Skin: Skin is warm and dry. Rash noted.  Small rash on arms and legs c/w chiggers   BP 118/69 (BP Location: Left Arm, Patient Position: Sitting)   Pulse (!) 54   Temp 98.5 F (36.9 C) (Oral)   Ht 5' (1.524 m)   Wt 156 lb 6.4 oz (70.9 kg)   LMP 09/29/2017   BMI 30.54 kg/m   Allergies as of 10/06/2017      Reactions   Coconut Oil Swelling   Amoxicillin Rash   Has patient had a PCN reaction causing immediate rash, facial/tongue/throat swelling, SOB or lightheadedness with hypotension: No Has patient had a PCN reaction causing severe rash involving mucus membranes or skin necrosis: No Has patient had a PCN reaction that required hospitalization: No Has patient had a PCN reaction  occurring within the last 10 years: No If all of the above answers are "NO", then may proceed with Cephalosporin use.   Penicillins Itching, Other (See Comments)   Has patient had a PCN reaction causing immediate rash, facial/tongue/throat swelling, SOB or lightheadedness with hypotension: No Has patient had a PCN reaction causing severe rash involving mucus membranes or skin necrosis: No Has patient had a PCN reaction that required hospitalization: No Has patient had a PCN reaction occurring within the last 10 years: Yes If all of the above answers are "NO", then may proceed with Cephalosporin use.   Singulair [montelukast Sodium] Rash      Medication List        Accurate as of 10/06/17  7:07 PM. Always use your most recent med list.          albuterol 108 (90 Base) MCG/ACT inhaler Commonly known as:  PROVENTIL HFA;VENTOLIN HFA INHALE 2 PUFFS EVERY 4 HOURS AS NEEDED FOR COUGH AND WHEEZING.   amLODipine 10 MG tablet Commonly known as:  NORVASC TAKE ONE TABLET BY MOUTH EVERY DAY   aspirin 81 MG chewable tablet Chew 81 mg by mouth daily.   cetirizine 10 MG tablet Commonly known as:  ZYRTEC TAKE ONE TABLET BY MOUTH EVERY DAY   cloNIDine 0.3 MG tablet Commonly known as:  CATAPRES TAKE ONE TABLET BY MOUTH 3 TIMES A DAY   fluticasone 50 MCG/ACT nasal spray Commonly known as:  FLONASE Place 1 spray  into both nostrils 2 (two) times daily.   Fluticasone-Salmeterol 100-50 MCG/DOSE Aepb Commonly known as:  ADVAIR Inhale 1 puff into the lungs 2 (two) times daily.   furosemide 40 MG tablet Commonly known as:  LASIX TAKE ONE TABLET BY MOUTH EVERY DAY   loperamide 2 MG capsule Commonly known as:  IMODIUM Take 2 mg by mouth as needed for diarrhea or loose stools.   mometasone 0.1 % cream Commonly known as:  ELOCON Apply 1 application topically daily as needed.   montelukast 10 MG tablet Commonly known as:  SINGULAIR Take 1 tablet (10 mg total) by mouth at bedtime.     omeprazole 20 MG capsule Commonly known as:  PRILOSEC TAKE ONE CAPSULE BY MOUTH EVERY DAY   potassium chloride SA 20 MEQ tablet Commonly known as:  K-DUR,KLOR-CON Take 1 tablet (20 mEq total) by mouth daily.   promethazine 25 MG tablet Commonly known as:  PHENERGAN Take 1 tablet (25 mg total) by mouth every 6 (six) hours as needed for nausea or vomiting.   ranitidine 150 MG tablet Commonly known as:  ZANTAC Take 1 tablet (150 mg total) by mouth 2 (two) times daily.             Assessment & Plan:  1. Essential hypertension   2. Allergic rhinitis due to other allergic trigger, unspecified seasonality Stop Singulair  3. CKD (chronic kidney disease), stage IV ([place]) Return in 2-3 months F/U  4. Tobacco abuse Stop  5.Rule out UTI Culture

## 2017-10-06 NOTE — Addendum Note (Signed)
Addended by: Meda Klinefelter D on: 10/06/2017 07:38 PM   Modules accepted: Orders

## 2017-10-09 LAB — URINE CULTURE

## 2017-10-09 LAB — URINALYSIS, ROUTINE W REFLEX MICROSCOPIC
Bilirubin, UA: NEGATIVE
GLUCOSE, UA: NEGATIVE
KETONES UA: NEGATIVE
NITRITE UA: POSITIVE — AB
SPEC GRAV UA: 1.013 (ref 1.005–1.030)
Urobilinogen, Ur: 0.2 mg/dL (ref 0.2–1.0)
pH, UA: 5 (ref 5.0–7.5)

## 2017-10-09 LAB — MICROSCOPIC EXAMINATION

## 2017-10-13 ENCOUNTER — Other Ambulatory Visit: Payer: Self-pay | Admitting: Adult Health Nurse Practitioner

## 2017-10-13 MED ORDER — NITROFURANTOIN MONOHYD MACRO 100 MG PO CAPS: 100.0000 mg | ORAL_CAPSULE | Freq: Two times a day (BID) | ORAL | 0 refills | Status: DC

## 2017-10-13 MED ORDER — CIPROFLOXACIN HCL 250 MG PO TABS: 250.0000 mg | ORAL_TABLET | Freq: Two times a day (BID) | ORAL | 0 refills | Status: DC

## 2017-10-14 ENCOUNTER — Telehealth: Payer: Self-pay | Admitting: Family Medicine

## 2017-10-14 NOTE — Telephone Encounter (Signed)
-----   Message from Nori Riis, PA-C sent at 10/14/2017  7:43 AM EDT ----- Please let Mrs. Kage know that her kidney function is stable.

## 2017-10-14 NOTE — Telephone Encounter (Signed)
Patient notified

## 2017-10-21 ENCOUNTER — Other Ambulatory Visit: Payer: Self-pay | Admitting: Internal Medicine

## 2017-10-21 ENCOUNTER — Other Ambulatory Visit: Payer: Self-pay | Admitting: Adult Health Nurse Practitioner

## 2017-10-21 DIAGNOSIS — J3089 Other allergic rhinitis: Secondary | ICD-10-CM

## 2017-10-21 DIAGNOSIS — L309 Dermatitis, unspecified: Secondary | ICD-10-CM

## 2017-10-21 DIAGNOSIS — I10 Essential (primary) hypertension: Secondary | ICD-10-CM

## 2017-11-18 IMAGING — CR DG CHEST 2V
2 series · 2 of 2 positions shown · non-contrast
Comparison: 02/07/2015

CLINICAL DATA: Substernal chest pain

EXAM:
CHEST  2 VIEW

[chest pa]
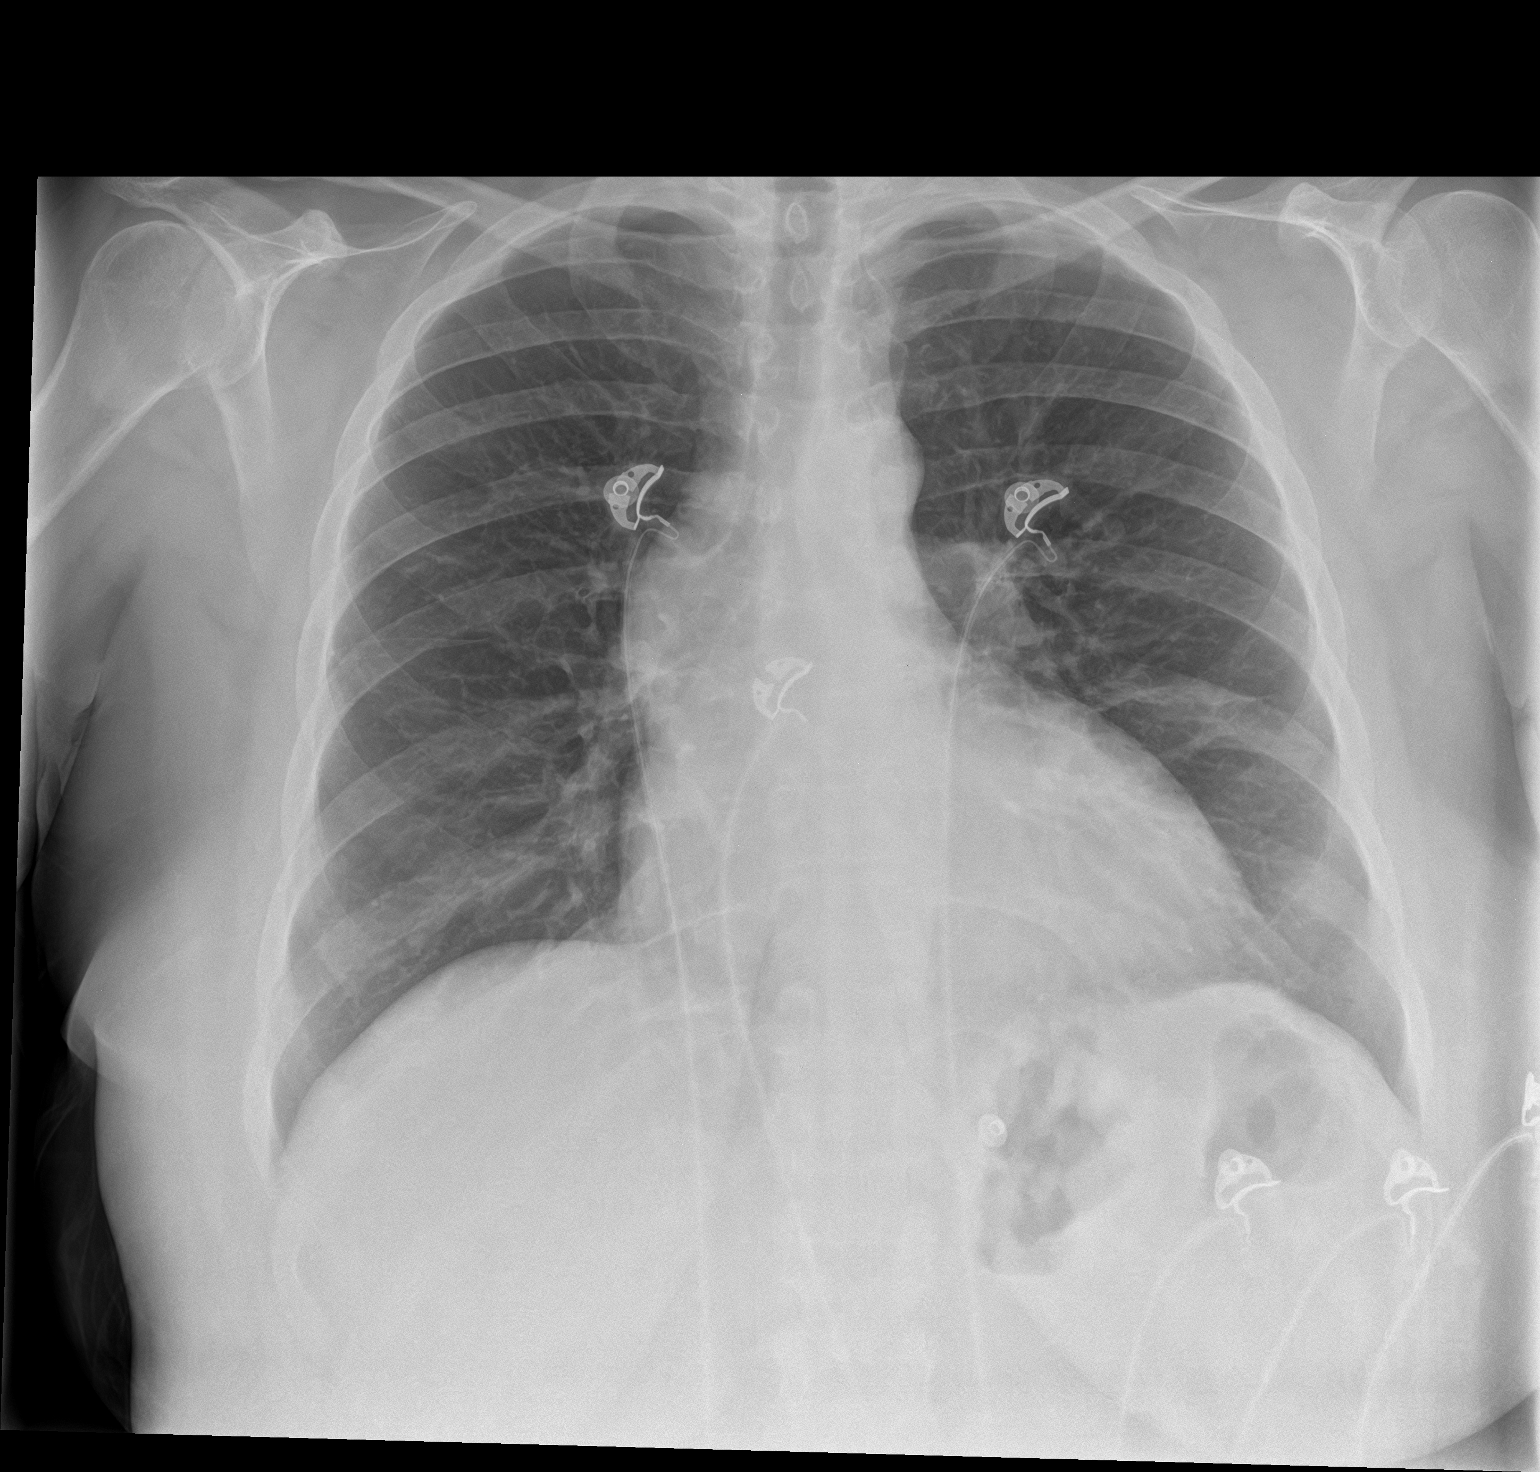

[chest lat]
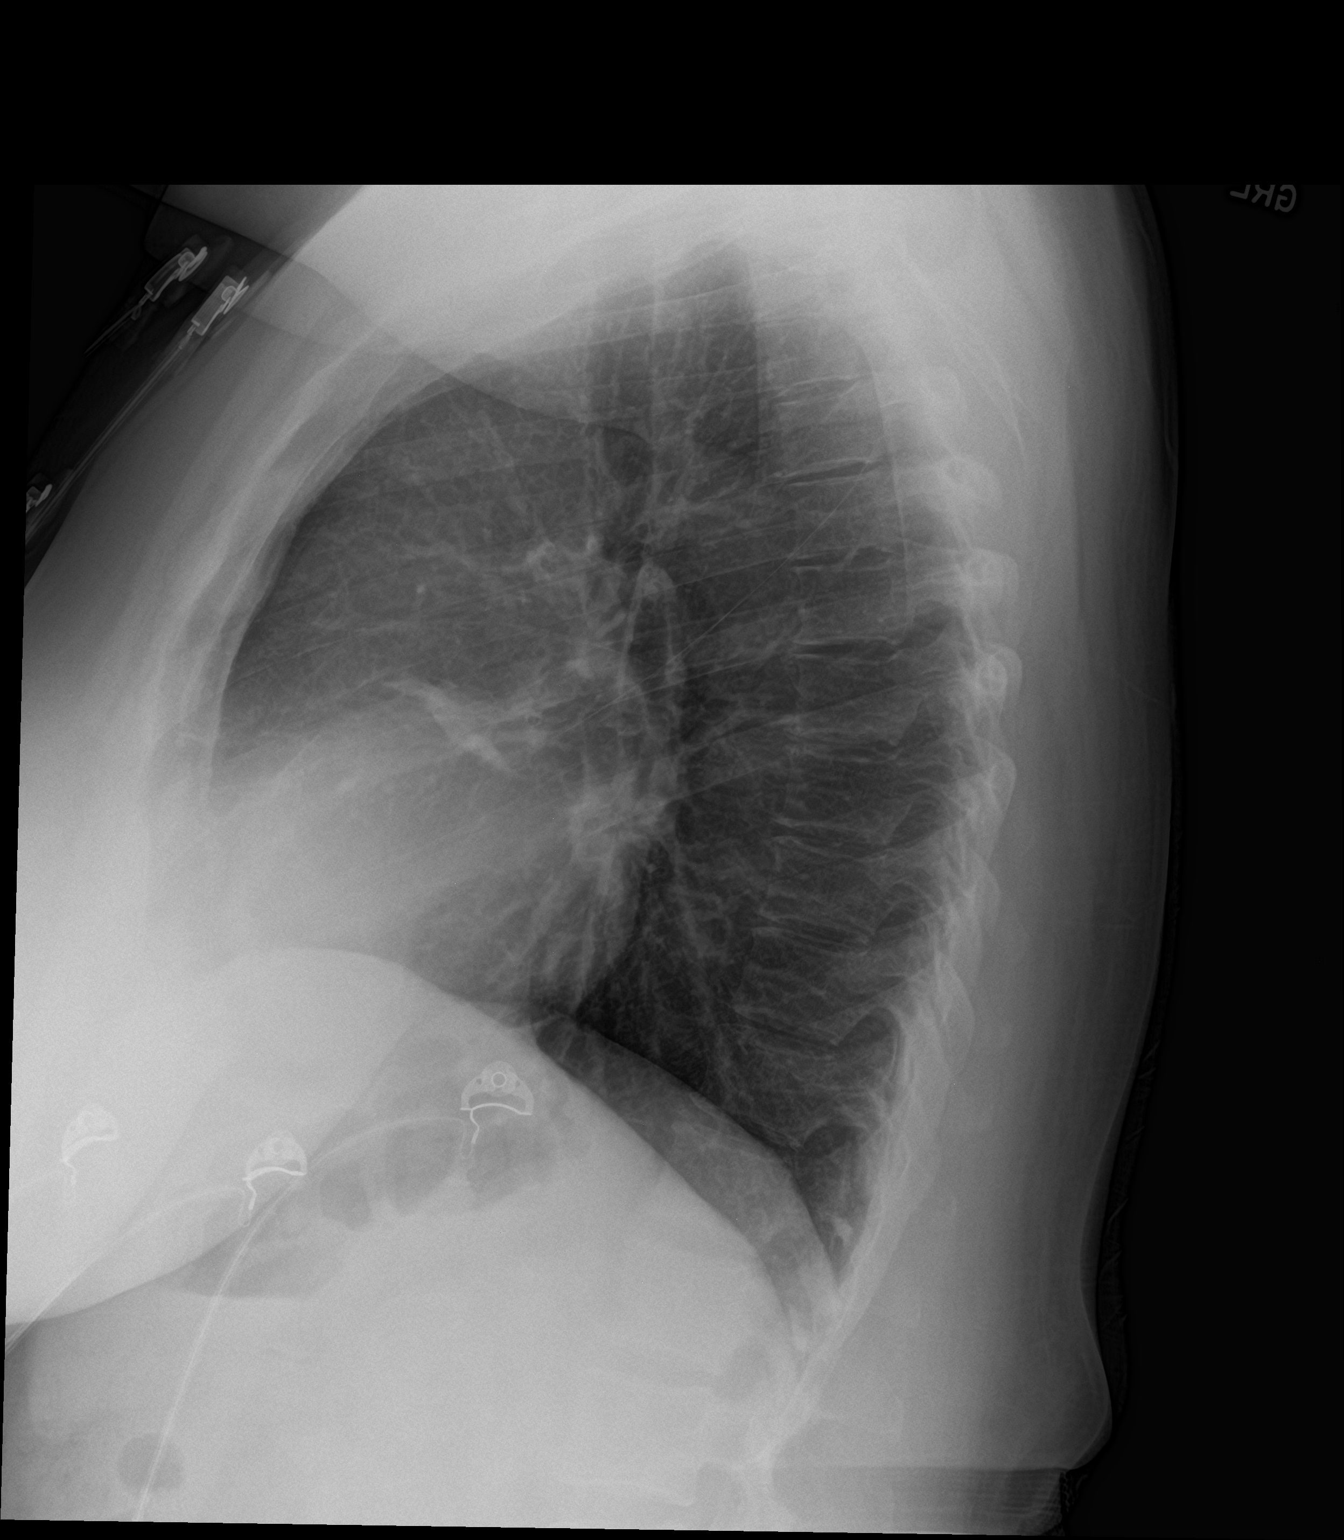

[2 of 2 positions shown; findings below may reference images not displayed]

FINDINGS: Cardiac shadow is within normal limits. The lungs are well aerated
bilaterally. Minimal atelectatic changes are noted the lingula. No
pleural effusion is noted. No bony abnormality is noted.
IMPRESSION: Mild lingular atelectasis.

## 2017-11-24 ENCOUNTER — Other Ambulatory Visit: Payer: Self-pay | Admitting: Internal Medicine

## 2017-11-24 ENCOUNTER — Other Ambulatory Visit: Payer: Self-pay | Admitting: Adult Health Nurse Practitioner

## 2017-11-24 DIAGNOSIS — L309 Dermatitis, unspecified: Secondary | ICD-10-CM

## 2017-11-24 DIAGNOSIS — J3089 Other allergic rhinitis: Secondary | ICD-10-CM

## 2017-11-24 DIAGNOSIS — I10 Essential (primary) hypertension: Secondary | ICD-10-CM

## 2017-11-24 DIAGNOSIS — K219 Gastro-esophageal reflux disease without esophagitis: Secondary | ICD-10-CM

## 2017-12-07 ENCOUNTER — Telehealth: Payer: Self-pay | Admitting: Pharmacist

## 2017-12-07 NOTE — Telephone Encounter (Signed)
12/07/2017 1:50:22 PM - Advair & Ventolin scripts to dr  12/07/17 Unable to refill with [facility], need script-Printed scripts for Advair 100/50 & Ventolin HFA-taking to [facility] for provider to sign.Delos Haring

## 2017-12-15 ENCOUNTER — Ambulatory Visit: Payer: Medicaid Other | Admitting: Adult Health Nurse Practitioner

## 2017-12-15 VITALS — BP 141/68 | HR 47 | Temp 97.9°F | Ht 61.0 in | Wt 160.2 lb

## 2017-12-15 DIAGNOSIS — Z0000 Encounter for general adult medical examination without abnormal findings: Secondary | ICD-10-CM

## 2017-12-15 DIAGNOSIS — N184 Chronic kidney disease, stage 4 (severe): Secondary | ICD-10-CM

## 2017-12-15 DIAGNOSIS — I10 Essential (primary) hypertension: Secondary | ICD-10-CM

## 2017-12-15 MED ORDER — PREDNISONE 5 MG (21) PO TBPK: ORAL_TABLET | ORAL | 0 refills | Status: DC

## 2017-12-15 NOTE — Progress Notes (Signed)
Patient: Gabrielle Schultz Female    DOB: Aug 16, 1970   47 y.o.   MRN: [id number] Visit Date: 12/15/2017  Today's Provider: Staci Acosta, NP   Chief Complaint  Patient presents with  . Results  . Knee Pain    Left shooting pain  . Elbow Pain    Left   Subjective:    HPI  Gabrielle Schultz states that her left knee and elbow started hurting 2 weeks ago. States that her knee feels like "someone is in there with a knife twisting" She states that she is now having to chase her one year old.  States that it feels like her elbow needs to pop.      COPD: Taking medications as directed- using Albuterol about once weekly.   BP taking medications as directed.   Allergies  Allergen Reactions  . Coconut Oil Swelling  . Amoxicillin Rash    Has patient had a PCN reaction causing immediate rash, facial/tongue/throat swelling, SOB or lightheadedness with hypotension: No Has patient had a PCN reaction causing severe rash involving mucus membranes or skin necrosis: No Has patient had a PCN reaction that required hospitalization: No Has patient had a PCN reaction occurring within the last 10 years: No If all of the above answers are "NO", then may proceed with Cephalosporin use.    Marland Kitchen Penicillins Itching and Other (See Comments)    Has patient had a PCN reaction causing immediate rash, facial/tongue/throat swelling, SOB or lightheadedness with hypotension: No Has patient had a PCN reaction causing severe rash involving mucus membranes or skin necrosis: No Has patient had a PCN reaction that required hospitalization: No Has patient had a PCN reaction occurring within the last 10 years: Yes If all of the above answers are "NO", then may proceed with Cephalosporin use.   Marland Kitchen Singulair [Montelukast Sodium] Rash   Previous Medications   ALBUTEROL (VENTOLIN HFA) 108 (90 BASE) MCG/ACT INHALER    INHALE 2 PUFFS EVERY 4 HOURS AS NEEDED FOR COUGH AND WHEEZING.   AMLODIPINE (NORVASC) 10 MG TABLET    TAKE ONE  TABLET BY MOUTH EVERY DAY   ASPIRIN 81 MG CHEWABLE TABLET    Chew 81 mg by mouth daily.   CETIRIZINE (ZYRTEC) 10 MG TABLET    TAKE ONE TABLET BY MOUTH EVERY DAY   CIPROFLOXACIN (CIPRO) 250 MG TABLET    Take 1 tablet (250 mg total) by mouth 2 (two) times daily.   CLONIDINE (CATAPRES) 0.3 MG TABLET    TAKE ONE TABLET BY MOUTH 3 TIMES A DAY   FLUTICASONE (FLONASE) 50 MCG/ACT NASAL SPRAY    PLACE 1 SPRAY INTO BOTH NOSTRILS 2 TIMES A DAY   FLUTICASONE-SALMETEROL (ADVAIR) 100-50 MCG/DOSE AEPB    Inhale 1 puff into the lungs 2 (two) times daily.   FUROSEMIDE (LASIX) 40 MG TABLET    TAKE ONE TABLET BY MOUTH EVERY DAY   LOPERAMIDE (IMODIUM) 2 MG CAPSULE    Take 2 mg by mouth as needed for diarrhea or loose stools.   MOMETASONE (ELOCON) 0.1 % CREAM    APPLY ONE APPLICATION TOPICALLY EVERY DAY AS NEEDED   MONTELUKAST (SINGULAIR) 10 MG TABLET    TAKE ONE TABLET BY MOUTH AT BEDTIME   OMEPRAZOLE (PRILOSEC) 20 MG CAPSULE    TAKE ONE CAPSULE BY MOUTH EVERY DAY   POTASSIUM CHLORIDE SA (KLOR-CON M20) 20 MEQ TABLET    Take 1 tablet (20 mEq total) by mouth daily.   PROMETHAZINE (PHENERGAN) 25 MG TABLET  Take 1 tablet (25 mg total) by mouth every 6 (six) hours as needed for nausea or vomiting.   RANITIDINE (ZANTAC) 150 MG TABLET    Take 1 tablet (150 mg total) by mouth 2 (two) times daily.    Review of Systems  Social History   Tobacco Use  . Smoking status: Current Every Day Smoker    Packs/day: 0.25    Years: 18.00    Pack years: 4.50    Types: Cigarettes    Start date: 03/04/2017  . Smokeless tobacco: Never Used  . Tobacco comment: Gabrielle Schultz quit in 07/2016 but restarted   Substance Use Topics  . Alcohol use: Yes    Comment: "seldom" - drinks 350-762mL when she does drink   Objective:   BP (!) 141/68   Pulse (!) 47   Temp 97.9 F (36.6 C)   Ht 5\' 1"  (1.549 m)   Wt 160 lb 3.2 oz (72.7 kg)   LMP 12/08/2017   BMI 30.27 kg/m   Physical Exam Vitals signs reviewed.  Constitutional:      Appearance:  Normal appearance.  Cardiovascular:     Heart sounds: Normal heart sounds.  Pulmonary:     Effort: Pulmonary effort is normal.     Breath sounds: Normal breath sounds.  Abdominal:     General: Bowel sounds are normal.     Palpations: Abdomen is soft.  Musculoskeletal:     Left elbow: She exhibits normal range of motion, no swelling, no effusion and no deformity. No tenderness found.     Left knee: She exhibits normal range of motion, no swelling, no effusion and no deformity. No tenderness found.  Skin:    General: Skin is warm and dry.  Neurological:     Mental Status: She is alert.         Assessment & Plan:         CBC, CMET, A1c, lipid panel, TSH  today.   BP was better on last visit- will monitor at next OV.  Continue current medications.   Continue Advair.  Discussed smoking cessation.   Prednisone dose pack for knee and elbow pain- limited ability to prescribe due to kidney and liver function.   FU in 6 weeks for lab results.    Staci Acosta, NP   [facility]

## 2017-12-16 ENCOUNTER — Telehealth: Payer: Self-pay | Admitting: Pharmacist

## 2017-12-16 LAB — COMPREHENSIVE METABOLIC PANEL
A/G RATIO: 1.4 (ref 1.2–2.2)
ALBUMIN: 4.1 g/dL (ref 3.5–5.5)
ALK PHOS: 175 IU/L — AB (ref 39–117)
ALT: 7 IU/L (ref 0–32)
AST: 6 IU/L (ref 0–40)
BUN / CREAT RATIO: 12 (ref 9–23)
BUN: 23 mg/dL (ref 6–24)
Bilirubin Total: <0.2 mg/dL (ref 0.0–1.2)
CO2: 21 mmol/L (ref 20–29)
CREATININE: 1.94 mg/dL — AB (ref 0.57–1.00)
Calcium: 9.1 mg/dL (ref 8.7–10.2)
Chloride: 101 mmol/L (ref 96–106)
GFR calc Af Amer: 35 mL/min/{1.73_m2} — ABNORMAL LOW (ref >59)
GFR, EST NON AFRICAN AMERICAN: 31 mL/min/{1.73_m2} — AB (ref >59)
GLOBULIN, TOTAL: 2.9 g/dL (ref 1.5–4.5)
Glucose: 96 mg/dL (ref 65–99)
Potassium: 4.2 mmol/L (ref 3.5–5.2)
SODIUM: 137 mmol/L (ref 134–144)
Total Protein: 7 g/dL (ref 6.0–8.5)

## 2017-12-16 LAB — TSH: TSH: 1.75 u[IU]/mL (ref 0.450–4.500)

## 2017-12-16 LAB — CBC
Hematocrit: 36.3 % (ref 34.0–46.6)
Hemoglobin: 11.9 g/dL (ref 11.1–15.9)
MCH: 29 pg (ref 26.6–33.0)
MCHC: 32.8 g/dL (ref 31.5–35.7)
MCV: 88 fL (ref 79–97)
PLATELETS: 292 10*3/uL (ref 150–450)
RBC: 4.11 x10E6/uL (ref 3.77–5.28)
RDW: 14.5 % (ref 12.3–15.4)
WBC: 8.9 10*3/uL (ref 3.4–10.8)

## 2017-12-16 LAB — LIPID PANEL
Chol/HDL Ratio: 8.1 ratio — ABNORMAL HIGH (ref 0.0–4.4)
Cholesterol, Total: 195 mg/dL (ref 100–199)
HDL: 24 mg/dL — ABNORMAL LOW (ref >39)
LDL Calculated: 126 mg/dL — ABNORMAL HIGH (ref 0–99)
Triglycerides: 224 mg/dL — ABNORMAL HIGH (ref 0–149)
VLDL Cholesterol Cal: 45 mg/dL — ABNORMAL HIGH (ref 5–40)

## 2017-12-16 LAB — HEMOGLOBIN A1C
Est. average glucose Bld gHb Est-mCnc: 114 mg/dL
Hgb A1c MFr Bld: 5.6 % (ref 4.8–5.6)

## 2017-12-16 NOTE — Telephone Encounter (Signed)
12/16/2017 11:19:05 AM - Advair 100/50 & Ventolin refills  12/16/17 Faxed scripts to [place] for refills on Advair 100/50 Inhale 1 puff into the lungs two times a day & Ventolin HFA Inhale 2 puffs every 4 hours as needed for cough & wheezing.Delos Haring

## 2017-12-19 ENCOUNTER — Other Ambulatory Visit: Payer: Self-pay | Admitting: Internal Medicine

## 2017-12-19 ENCOUNTER — Other Ambulatory Visit: Payer: Self-pay | Admitting: Adult Health Nurse Practitioner

## 2017-12-19 DIAGNOSIS — I10 Essential (primary) hypertension: Secondary | ICD-10-CM

## 2017-12-19 DIAGNOSIS — J3089 Other allergic rhinitis: Secondary | ICD-10-CM

## 2017-12-19 DIAGNOSIS — L309 Dermatitis, unspecified: Secondary | ICD-10-CM

## 2018-01-25 ENCOUNTER — Other Ambulatory Visit: Payer: Self-pay | Admitting: Internal Medicine

## 2018-01-25 ENCOUNTER — Other Ambulatory Visit: Payer: Self-pay | Admitting: Adult Health Nurse Practitioner

## 2018-01-25 DIAGNOSIS — J3089 Other allergic rhinitis: Secondary | ICD-10-CM

## 2018-01-25 DIAGNOSIS — I10 Essential (primary) hypertension: Secondary | ICD-10-CM

## 2018-01-26 ENCOUNTER — Ambulatory Visit: Payer: Medicaid Other | Admitting: Obstetrics and Gynecology

## 2018-01-26 VITALS — BP 137/68 | HR 53 | Temp 98.5°F | Ht 62.0 in | Wt 163.2 lb

## 2018-01-26 DIAGNOSIS — I10 Essential (primary) hypertension: Secondary | ICD-10-CM

## 2018-01-26 DIAGNOSIS — G8929 Other chronic pain: Secondary | ICD-10-CM

## 2018-01-26 DIAGNOSIS — M25522 Pain in left elbow: Secondary | ICD-10-CM

## 2018-01-26 DIAGNOSIS — N184 Chronic kidney disease, stage 4 (severe): Secondary | ICD-10-CM

## 2018-01-26 DIAGNOSIS — K219 Gastro-esophageal reflux disease without esophagitis: Secondary | ICD-10-CM

## 2018-01-26 MED ORDER — OMEPRAZOLE 20 MG PO CPDR: 20.0000 mg | DELAYED_RELEASE_CAPSULE | Freq: Every day | ORAL | 0 refills | Status: DC

## 2018-01-26 MED ORDER — AMLODIPINE BESYLATE 10 MG PO TABS: 10.0000 mg | ORAL_TABLET | Freq: Every day | ORAL | 1 refills | Status: DC

## 2018-01-26 NOTE — Patient Instructions (Signed)
I value your feedback and entrusting us with your care. If you get a [facility] patient survey, I would appreciate you taking the time to let us know about your experience today. Thank you! 

## 2018-01-26 NOTE — Progress Notes (Signed)
Boyce Medici, FNP   Chief Complaint  Patient presents with  . Follow-up    HPI:      Gabrielle Schultz is a 48 y.o. [id number] who LMP was Patient's last menstrual period was 01/04/2018 (approximate)., presents today for BP f/u. Doing well with amlodipine and clonidine.  Kidney labs stable from 12/19. Knee pain improved with prednisone, LT elbow pain persists. Lifts 20# daughter with Left side since had stroke on RT side in past. Using ice/heat/icy hot without relief.    Past Medical History:  Diagnosis Date  . Acid reflux   . Allergic rhinitis 06/26/2015  . Allergy   . Asthma   . Carpal tunnel syndrome    right hand  . Chronic kidney disease   . Colitis   . CVA (cerebral infarction)    2009, 2015  . History of syncope   . Hypertension 2008  . Low HDL (under 40) 06/26/2015  . MI (myocardial infarction) ([place])    2015 - patient wasnt told by cardiologist that she had MI but has had cardiac cath in past  . Migraines   . Poor circulation   . Renal insufficiency   . Stroke ([place])   . Syncope and collapse     Past Surgical History:  Procedure Laterality Date  . CESAREAN SECTION  1994   Dr. Ammie Dalton  . COLONOSCOPY WITH PROPOFOL N/A 06/26/2014   Procedure: COLONOSCOPY WITH PROPOFOL;  Surgeon: Josefine Class, MD;  Location: [facility] ENDOSCOPY;  Service: Endoscopy;  Laterality: N/A;  . DILATION AND CURETTAGE OF UTERUS  x2 for incomplete SABs    Family History  Problem Relation Age of Onset  . Hyperlipidemia Mother   . Heart disease Mother        has a defibrillator  . COPD Mother   . Hypertension Father   . Heart disease Father   . Hyperlipidemia Father   . Hypertension Sister        died in her 66s from heart failure and end stage kidney disease  . Kidney disease Sister   . Hypertension Brother   . Heart disease Brother        has a defibrillator  . Coronary artery disease Brother        had CABG    Social History   Socioeconomic History  . Marital  status: Married    Spouse name: Not on file  . Number of children: 1  . Years of education: Not on file  . Highest education level: Not on file  Occupational History  . Not on file  Social Needs  . Financial resource strain: Hard  . Food insecurity:    Worry: Sometimes true    Inability: Sometimes true  . Transportation needs:    Medical: Yes    Non-medical: Yes  Tobacco Use  . Smoking status: Current Every Day Smoker    Packs/day: 0.25    Years: 18.00    Pack years: 4.50    Types: Cigarettes    Start date: 03/04/2017  . Smokeless tobacco: Never Used  . Tobacco comment: pt quit in 07/2016 but restarted   Substance and Sexual Activity  . Alcohol use: Yes    Comment: "seldom" - drinks 350-770m when she does drink  . Drug use: No  . Sexual activity: Yes    Partners: Male    Birth control/protection: None  Lifestyle  . Physical activity:    Days per week: 7 days  Minutes per session: 40 min  . Stress: Very much  Relationships  . Social connections:    Talks on phone: More than three times a week    Gets together: Never    Attends religious service: Never    Active member of club or organization: No    Attends meetings of clubs or organizations: Never    Relationship status: Married  . Intimate partner violence:    Fear of current or ex partner: No    Emotionally abused: No    Physically abused: No    Forced sexual activity: No  Other Topics Concern  . Not on file  Social History Narrative   Husband is a Administrator for [employer] and is the sole provider for the family including 2 children (69 yr old and 63 yr old)       Patient filled out [place] [facility] Consent Form.    Outpatient Medications Prior to Visit  Medication Sig Dispense Refill  . albuterol (VENTOLIN HFA) 108 (90 Base) MCG/ACT inhaler INHALE 2 PUFFS EVERY 4 HOURS AS NEEDED FOR COUGH AND WHEEZING. 54 g 1  . aspirin 81 MG chewable tablet Chew 81 mg by mouth daily.    . cetirizine (ZYRTEC) 10 MG tablet  TAKE ONE TABLET BY MOUTH EVERY DAY 30 tablet 0  . cloNIDine (CATAPRES) 0.3 MG tablet TAKE ONE TABLET BY MOUTH 3 TIMES A DAY 90 tablet 0  . fluticasone (FLONASE) 50 MCG/ACT nasal spray PLACE 1 SPRAY INTO BOTH NOSTRILS 2 TIMES A DAY 16 g 0  . Fluticasone-Salmeterol (ADVAIR) 100-50 MCG/DOSE AEPB Inhale 1 puff into the lungs 2 (two) times daily. 1 each 3  . furosemide (LASIX) 40 MG tablet TAKE ONE TABLET BY MOUTH EVERY DAY 30 tablet 0  . loperamide (IMODIUM) 2 MG capsule Take 2 mg by mouth as needed for diarrhea or loose stools.    . mometasone (ELOCON) 0.1 % cream APPLY ONE APPLICATION TOPICALLY EVERY DAY AS NEEDED 45 g 0  . montelukast (SINGULAIR) 10 MG tablet TAKE ONE TABLET BY MOUTH AT BEDTIME 90 tablet 0  . potassium chloride SA (KLOR-CON M20) 20 MEQ tablet Take 1 tablet (20 mEq total) by mouth daily. 30 tablet 1  . predniSONE (STERAPRED UNI-PAK 21 TAB) 5 MG (21) TBPK tablet 6 tabs day 1, 5 tabs day 2, 4 tabs day 3, 3 tabs day 4, 2 tabs day 5, 1 tab day 6 21 tablet 0  . promethazine (PHENERGAN) 25 MG tablet Take 1 tablet (25 mg total) by mouth every 6 (six) hours as needed for nausea or vomiting. 30 tablet 0  . ranitidine (ZANTAC) 150 MG tablet Take 1 tablet (150 mg total) by mouth 2 (two) times daily. 60 tablet 3  . amLODipine (NORVASC) 10 MG tablet TAKE ONE TABLET BY MOUTH EVERY DAY 30 tablet 0  . omeprazole (PRILOSEC) 20 MG capsule TAKE ONE CAPSULE BY MOUTH EVERY DAY 90 capsule 0  . ciprofloxacin (CIPRO) 250 MG tablet Take 1 tablet (250 mg total) by mouth 2 (two) times daily. (Patient not taking: Reported on 01/26/2018) 6 tablet 0   No facility-administered medications prior to visit.       ROS:  Review of Systems   OBJECTIVE:   Vitals:  BP 137/68 (BP Location: Left Arm, Patient Position: Sitting, Cuff Size: Normal)   Pulse (!) 53   Temp 98.5 F (36.9 C)   Ht '5\' 2"'$  (1.575 m)   Wt 163 lb 3.2 oz (74 kg)  LMP 01/04/2018 (Approximate)   BMI 29.85 kg/m   Physical Exam Vitals  signs reviewed.  Constitutional:      Appearance: She is well-developed.  Neck:     Musculoskeletal: Normal range of motion.  Pulmonary:     Effort: Pulmonary effort is normal.  Musculoskeletal: Normal range of motion.     Left forearm: She exhibits tenderness and bony tenderness. She exhibits no swelling and no edema.  Neurological:     Mental Status: She is alert and oriented to person, place, and time.     Cranial Nerves: No cranial nerve deficit.  Psychiatric:        Behavior: Behavior normal.        Thought Content: Thought content normal.        Judgment: Judgment normal.     Results: Recent Results (from the past 2160 hour(s))  Comp Met (CMET)     Status: Abnormal   Collection Time: 12/15/17  7:37 PM  Result Value Ref Range   Glucose 96 65 - 99 mg/dL   BUN 23 6 - 24 mg/dL   Creatinine, Ser 1.94 (H) 0.57 - 1.00 mg/dL   GFR calc non Af Amer 31 (L) >59 mL/min/1.73   GFR calc Af Amer 35 (L) >59 mL/min/1.73   BUN/Creatinine Ratio 12 9 - 23   Sodium 137 134 - 144 mmol/L   Potassium 4.2 3.5 - 5.2 mmol/L   Chloride 101 96 - 106 mmol/L   CO2 21 20 - 29 mmol/L   Calcium 9.1 8.7 - 10.2 mg/dL   Total Protein 7.0 6.0 - 8.5 g/dL   Albumin 4.1 3.5 - 5.5 g/dL   Globulin, Total 2.9 1.5 - 4.5 g/dL   Albumin/Globulin Ratio 1.4 1.2 - 2.2   Bilirubin Total <0.2 0.0 - 1.2 mg/dL   Alkaline Phosphatase 175 (H) 39 - 117 IU/L   AST 6 0 - 40 IU/L   ALT 7 0 - 32 IU/L  CBC     Status: None   Collection Time: 12/15/17  7:37 PM  Result Value Ref Range   WBC 8.9 3.4 - 10.8 x10E3/uL   RBC 4.11 3.77 - 5.28 x10E6/uL   Hemoglobin 11.9 11.1 - 15.9 g/dL   Hematocrit 36.3 34.0 - 46.6 %   MCV 88 79 - 97 fL   MCH 29.0 26.6 - 33.0 pg   MCHC 32.8 31.5 - 35.7 g/dL   RDW 14.5 12.3 - 15.4 %    Comment: **Effective January 09, 2018, the RDW pediatric reference**   interval will be removed and the adult reference interval   will be changing to:                             Female 11.7 - 15.4                                                       Female 11.6 - 15.4    Platelets 292 150 - 450 x10E3/uL  Lipid Profile     Status: Abnormal   Collection Time: 12/15/17  7:37 PM  Result Value Ref Range   Cholesterol, Total 195 100 - 199 mg/dL   Triglycerides 224 (H) 0 - 149 mg/dL   HDL 24 (L) >39 mg/dL   VLDL Cholesterol Cal  45 (H) 5 - 40 mg/dL   LDL Calculated 126 (H) 0 - 99 mg/dL   Chol/HDL Ratio 8.1 (H) 0.0 - 4.4 ratio    Comment:                                   T. Chol/HDL Ratio                                             Men  Women                               1/2 Avg.Risk  3.4    3.3                                   Avg.Risk  5.0    4.4                                2X Avg.Risk  9.6    7.1                                3X Avg.Risk 23.4   11.0   TSH     Status: None   Collection Time: 12/15/17  7:37 PM  Result Value Ref Range   TSH 1.750 0.450 - 4.500 uIU/mL  HgB A1c     Status: None   Collection Time: 12/15/17  7:37 PM  Result Value Ref Range   Hgb A1c MFr Bld 5.6 4.8 - 5.6 %    Comment:          Prediabetes: 5.7 - 6.4          Diabetes: >6.4          Glycemic control for adults with diabetes: <7.0    Est. average glucose Bld gHb Est-mCnc 114 mg/dL    Assessment/Plan: Essential hypertension - Controlled. Rx RF amlodipine. Has clonidine and lasix. F/u for BP check in 8 wks. Cont clonidine and lasix. CMP. - Plan: amLODipine (NORVASC) 10 MG tablet  Gastroesophageal reflux disease without esophagitis - Rx RF omeprazole.  - Plan: omeprazole (PRILOSEC) 20 MG capsule  CKD (chronic kidney disease), stage IV ([place]) - Rechk labs in 2 months (last checked 12/19),   Elbow pain, chronic, left - Not improved with prednisone. Cont ice/decrease lifting. Can't take NSAIDs.     Meds ordered this encounter  Medications  . amLODipine (NORVASC) 10 MG tablet    Sig: Take 1 tablet (10 mg total) by mouth daily.    Dispense:  30 tablet    Refill:  1    Order Specific Question:    Supervising Provider    Answer:   Gae Dry [id number]  . omeprazole (PRILOSEC) 20 MG capsule    Sig: Take 1 capsule (20 mg total) by mouth daily.    Dispense:  90 capsule    Refill:  0    Order Specific Question:   Supervising Provider    Answer:   Gae Dry [[id number]]      Return in about 2 months (  around 03/27/2018) for BP check and labs; Dr. Jefferson Fuel appt for vision.  Gabrielle Marconi B. Christina Gintz, PA-C 01/26/2018 6:30 PM

## 2018-02-02 ENCOUNTER — Ambulatory Visit: Payer: Medicaid Other | Admitting: Ophthalmology

## 2018-02-23 ENCOUNTER — Other Ambulatory Visit: Payer: Self-pay | Admitting: Internal Medicine

## 2018-02-23 ENCOUNTER — Other Ambulatory Visit: Payer: Self-pay | Admitting: Adult Health Nurse Practitioner

## 2018-02-23 DIAGNOSIS — J3089 Other allergic rhinitis: Secondary | ICD-10-CM

## 2018-02-23 DIAGNOSIS — I10 Essential (primary) hypertension: Secondary | ICD-10-CM

## 2018-03-07 ENCOUNTER — Telehealth: Payer: Self-pay | Admitting: Pharmacist

## 2018-03-07 NOTE — Telephone Encounter (Signed)
03/07/2018 1:51:34 PM - Advair & Ventolin for renewal to pt/dr  03/07/2018 Time for patient to renew with [employer]-printed application, mailing patient her portion to sign & return with current household income and 2019 taxes when they file; also sending scripts to [facility] for provider to sign for Ventolin HFA 90 mcg Inhale 2 puffs every 4 hours as needed for wheezing and cough, also Advair 100/50 Inhale 1 puff into the lungs two times a day/this is for renewal.AJ

## 2018-03-22 ENCOUNTER — Encounter (INDEPENDENT_AMBULATORY_CARE_PROVIDER_SITE_OTHER): Payer: Self-pay

## 2018-03-22 ENCOUNTER — Telehealth: Payer: Self-pay | Admitting: Pharmacist

## 2018-03-22 NOTE — Telephone Encounter (Signed)
03/22/2018 11:42:41 AM - Advair & Ventolin pending [employer] renewal  03/22/2018 I have received the signed scripts back from provider for Advair 100/50 Inhale 1 puff into the lungs two times a day, & Ventolin 90 Inhale 2 puffs every 4 hours as needed for cough or wheezing-holding for patient to return her portion mailed to her 03/07/2018.Delos Haring

## 2018-03-23 ENCOUNTER — Other Ambulatory Visit: Payer: Self-pay

## 2018-03-23 ENCOUNTER — Other Ambulatory Visit: Payer: Medicaid Other

## 2018-03-23 VITALS — BP 139/73 | HR 57

## 2018-03-23 DIAGNOSIS — I10 Essential (primary) hypertension: Secondary | ICD-10-CM

## 2018-03-23 DIAGNOSIS — K219 Gastro-esophageal reflux disease without esophagitis: Secondary | ICD-10-CM

## 2018-03-23 NOTE — Progress Notes (Signed)
BP reviewed. At goal.

## 2018-03-24 LAB — COMPREHENSIVE METABOLIC PANEL
ALT: 5 IU/L (ref 0–32)
AST: 10 IU/L (ref 0–40)
Albumin/Globulin Ratio: 1.3 (ref 1.2–2.2)
Albumin: 4 g/dL (ref 3.8–4.8)
Alkaline Phosphatase: 178 IU/L — ABNORMAL HIGH (ref 39–117)
BUN/Creatinine Ratio: 11 (ref 9–23)
BUN: 25 mg/dL — ABNORMAL HIGH (ref 6–24)
Bilirubin Total: <0.2 mg/dL (ref 0.0–1.2)
CHLORIDE: 102 mmol/L (ref 96–106)
CO2: 18 mmol/L — ABNORMAL LOW (ref 20–29)
Calcium: 9.5 mg/dL (ref 8.7–10.2)
Creatinine, Ser: 2.3 mg/dL — ABNORMAL HIGH (ref 0.57–1.00)
GFR calc Af Amer: 29 mL/min/{1.73_m2} — ABNORMAL LOW (ref >59)
GFR calc non Af Amer: 25 mL/min/{1.73_m2} — ABNORMAL LOW (ref >59)
Globulin, Total: 3.1 g/dL (ref 1.5–4.5)
Glucose: 87 mg/dL (ref 65–99)
Potassium: 4.6 mmol/L (ref 3.5–5.2)
Sodium: 136 mmol/L (ref 134–144)
Total Protein: 7.1 g/dL (ref 6.0–8.5)

## 2018-03-24 LAB — HEMOGLOBIN A1C
Est. average glucose Bld gHb Est-mCnc: 111 mg/dL
HEMOGLOBIN A1C: 5.5 % (ref 4.8–5.6)

## 2018-03-30 ENCOUNTER — Ambulatory Visit: Payer: Medicaid Other | Admitting: Urology

## 2018-03-30 ENCOUNTER — Other Ambulatory Visit: Payer: Self-pay | Admitting: Internal Medicine

## 2018-03-30 ENCOUNTER — Encounter: Payer: Self-pay | Admitting: Urology

## 2018-03-30 ENCOUNTER — Other Ambulatory Visit: Payer: Self-pay

## 2018-03-30 ENCOUNTER — Other Ambulatory Visit: Payer: Medicaid Other

## 2018-03-30 DIAGNOSIS — K219 Gastro-esophageal reflux disease without esophagitis: Secondary | ICD-10-CM

## 2018-03-30 DIAGNOSIS — J3089 Other allergic rhinitis: Secondary | ICD-10-CM

## 2018-03-30 DIAGNOSIS — M79644 Pain in right finger(s): Secondary | ICD-10-CM

## 2018-03-30 DIAGNOSIS — I10 Essential (primary) hypertension: Secondary | ICD-10-CM

## 2018-03-30 DIAGNOSIS — Z889 Allergy status to unspecified drugs, medicaments and biological substances status: Secondary | ICD-10-CM

## 2018-03-30 DIAGNOSIS — E876 Hypokalemia: Secondary | ICD-10-CM

## 2018-03-30 MED ORDER — PROMETHAZINE HCL 25 MG PO TABS: 25.0000 mg | ORAL_TABLET | Freq: Four times a day (QID) | ORAL | 0 refills | Status: DC | PRN

## 2018-03-30 MED ORDER — POTASSIUM CHLORIDE CRYS ER 20 MEQ PO TBCR: 20.0000 meq | EXTENDED_RELEASE_TABLET | Freq: Every day | ORAL | 1 refills | Status: DC

## 2018-03-30 MED ORDER — CLONIDINE HCL 0.3 MG PO TABS: 0.3000 mg | ORAL_TABLET | Freq: Three times a day (TID) | ORAL | 0 refills | Status: DC

## 2018-03-30 MED ORDER — FLUTICASONE PROPIONATE 50 MCG/ACT NA SUSP: NASAL | 0 refills | Status: DC

## 2018-03-30 MED ORDER — FUROSEMIDE 40 MG PO TABS: 40.0000 mg | ORAL_TABLET | Freq: Every day | ORAL | 0 refills | Status: DC

## 2018-03-30 MED ORDER — CETIRIZINE HCL 10 MG PO TABS: 10.0000 mg | ORAL_TABLET | Freq: Every day | ORAL | 0 refills | Status: DC

## 2018-03-30 MED ORDER — MONTELUKAST SODIUM 10 MG PO TABS: 10.0000 mg | ORAL_TABLET | Freq: Every day | ORAL | 0 refills | Status: DC

## 2018-03-30 MED ORDER — AMLODIPINE BESYLATE 10 MG PO TABS: 10.0000 mg | ORAL_TABLET | Freq: Every day | ORAL | 1 refills | Status: DC

## 2018-03-30 MED ORDER — OMEPRAZOLE 20 MG PO CPDR: 20.0000 mg | DELAYED_RELEASE_CAPSULE | Freq: Every day | ORAL | 0 refills | Status: DC

## 2018-03-30 NOTE — Progress Notes (Signed)
Virtual Visit via Telephone Note  I connected with Gabrielle Schultz on 03/30/18 at  6:30 PM EDT by telephone and verified that I am speaking with the correct person using two identifiers.   I discussed the limitations, risks, security and privacy concerns of performing an evaluation and management service by telephone and the availability of in person appointments. I also discussed with the patient that there may be a patient responsible charge related to this service. The patient expressed understanding and agreed to proceed.   History of Present Illness: Right thumb throbbing pain - no swelling - no loss of motion - right handed - she does a lot of cooking, cleaning, etc. - steroids were of no help - one week of pain - no numbness  Left elbow pain x 2 months - no swelling - no loss of motion - holds 19 months old 30 lbs baby on her left side - steroids were of no help - no numbness    HgB A1c 5.5 % which is stable  Serum creatinine was 2.30, which is stable    Observations/Objective:   Assessment and Plan:  1. Right thumb pain Check uric acid levels - strong family of gout Advised to purchase a wrist/thumb support brace May be to over use with lifting frying pans   2. Left elbow pain See above  Follow Up Instructions: See above    I discussed the assessment and treatment plan with the patient. The patient was provided an opportunity to ask questions and all were answered. The patient agreed with the plan and demonstrated an understanding of the instructions.   The patient was advised to call back or seek an in-person evaluation if the symptoms worsen or if the condition fails to improve as anticipated.  I provided 30 minutes of non-face-to-face time during this encounter.   Akshar Starnes, PA-C

## 2018-03-31 ENCOUNTER — Telehealth: Payer: Self-pay

## 2018-03-31 NOTE — Telephone Encounter (Signed)
Rx for Phenergan faxed to medication management

## 2018-04-05 ENCOUNTER — Other Ambulatory Visit: Payer: Medicaid Other

## 2018-04-05 ENCOUNTER — Other Ambulatory Visit: Payer: Self-pay

## 2018-04-05 DIAGNOSIS — M79644 Pain in right finger(s): Secondary | ICD-10-CM

## 2018-04-06 LAB — URIC ACID: Uric Acid: 8.7 mg/dL — ABNORMAL HIGH (ref 2.5–7.1)

## 2018-05-17 IMAGING — CR DG CHEST 2V
2 series · 2 of 2 positions shown · non-contrast
Comparison: Chest x-ray dated January 18, 2017.

CLINICAL DATA: Chest pain.

EXAM:
CHEST  2 VIEW

[chest pa]
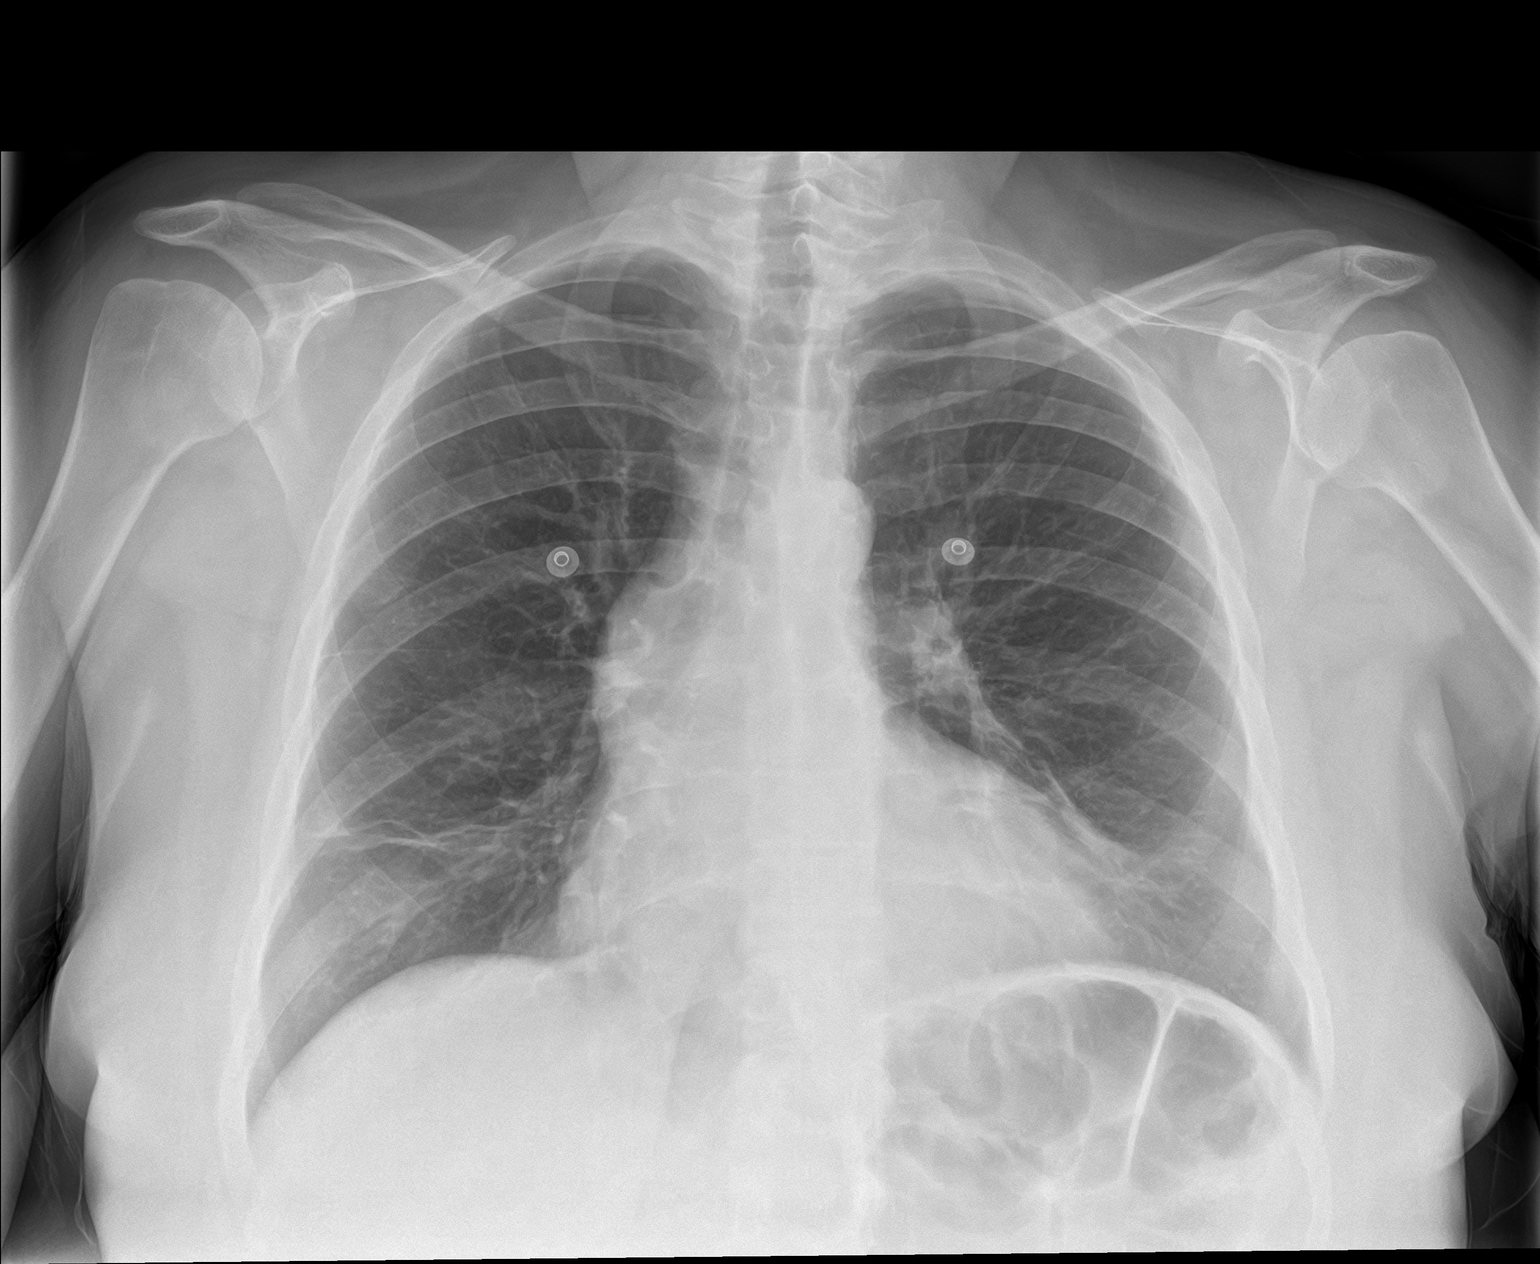

[chest lat]
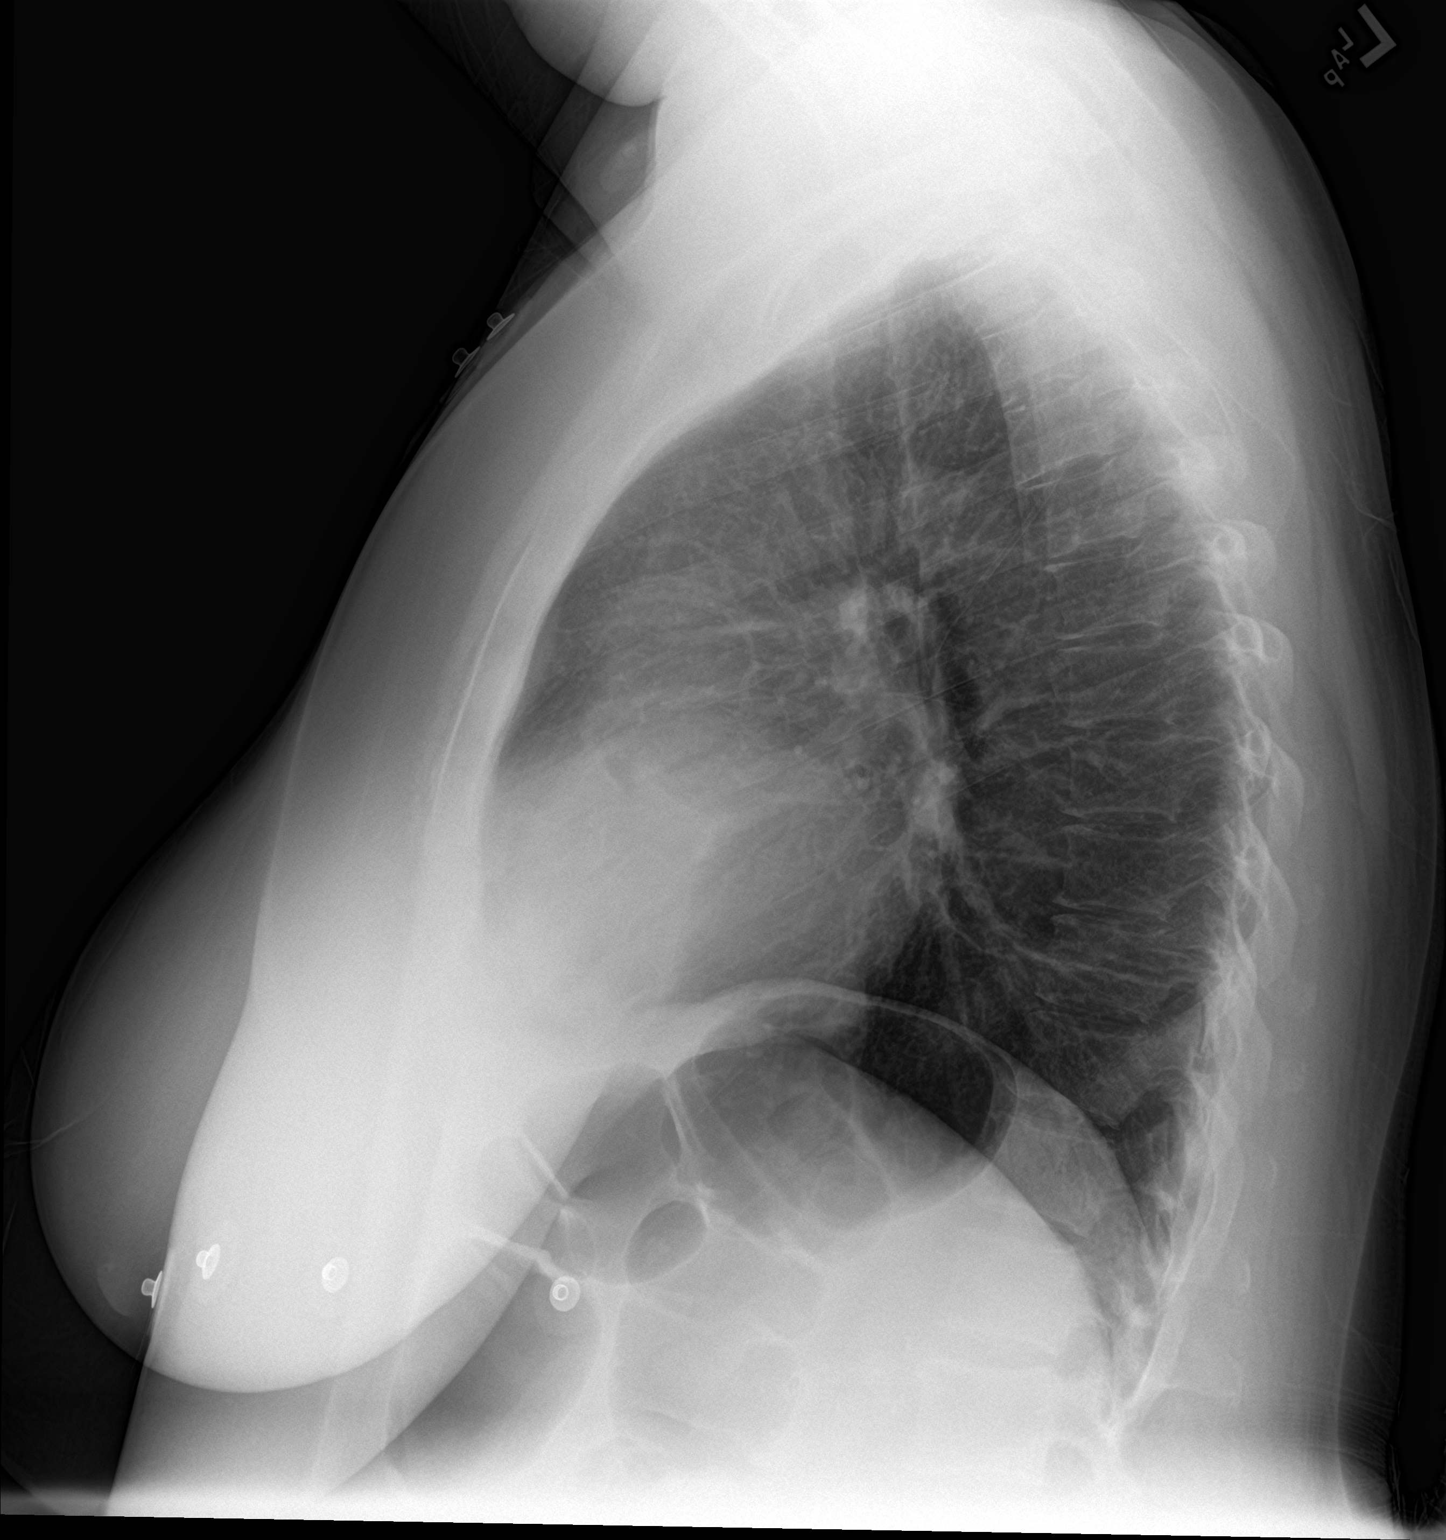

[2 of 2 positions shown; findings below may reference images not displayed]

FINDINGS: Stable cardiomegaly. Normal pulmonary vascularity. Streaky, linear
atelectasis in both lower lobes. No focal consolidation, pleural
effusion, or pneumothorax. No acute osseous abnormality.
IMPRESSION: Mild lower lobe atelectasis.  No active cardiopulmonary disease.

## 2018-05-25 ENCOUNTER — Other Ambulatory Visit: Payer: Self-pay

## 2018-05-25 ENCOUNTER — Other Ambulatory Visit: Payer: Self-pay | Admitting: Internal Medicine

## 2018-05-25 ENCOUNTER — Other Ambulatory Visit: Payer: Medicaid Other

## 2018-05-25 DIAGNOSIS — J3089 Other allergic rhinitis: Secondary | ICD-10-CM

## 2018-05-25 DIAGNOSIS — E876 Hypokalemia: Secondary | ICD-10-CM

## 2018-05-25 DIAGNOSIS — I10 Essential (primary) hypertension: Secondary | ICD-10-CM

## 2018-05-25 DIAGNOSIS — K219 Gastro-esophageal reflux disease without esophagitis: Secondary | ICD-10-CM

## 2018-05-25 MED ORDER — OMEPRAZOLE 20 MG PO CPDR: 20.0000 mg | DELAYED_RELEASE_CAPSULE | Freq: Every day | ORAL | 0 refills | Status: DC

## 2018-05-25 MED ORDER — CETIRIZINE HCL 10 MG PO TABS: 10.0000 mg | ORAL_TABLET | Freq: Every day | ORAL | 2 refills | Status: DC

## 2018-05-25 MED ORDER — MONTELUKAST SODIUM 10 MG PO TABS: 10.0000 mg | ORAL_TABLET | Freq: Every day | ORAL | 0 refills | Status: DC

## 2018-05-26 ENCOUNTER — Other Ambulatory Visit: Payer: Self-pay | Admitting: Urology

## 2018-05-26 DIAGNOSIS — M1000 Idiopathic gout, unspecified site: Secondary | ICD-10-CM

## 2018-05-26 LAB — HCG, SERUM, QUALITATIVE: hCG,Beta Subunit,Qual,Serum: NEGATIVE m[IU]/mL (ref <6)

## 2018-05-26 MED ORDER — ALLOPURINOL 100 MG PO TABS: 100.0000 mg | ORAL_TABLET | Freq: Every day | ORAL | 11 refills | Status: DC

## 2018-05-31 ENCOUNTER — Telehealth: Payer: Self-pay | Admitting: Pharmacist

## 2018-05-31 NOTE — Telephone Encounter (Signed)
05/31/2018 10:54:10 AM - Advair & Ventolin scripts to provider  05/31/2018 I have received patient's spouse income and signature on [place] application, the scripts for Ventolin & Advair are signature by provider is over 9 days old, printed scripts and taking to [facility] for provider to sign.Delos Haring

## 2018-06-01 ENCOUNTER — Telehealth: Payer: Self-pay | Admitting: Pharmacy Technician

## 2018-06-01 NOTE — Telephone Encounter (Signed)
Received 2020 proof of income.  Patient eligible to receive medication assistance at [facility] as long as eligibility requirements continue to be met.  [facility]

## 2018-06-07 ENCOUNTER — Other Ambulatory Visit: Payer: Medicaid Other

## 2018-06-08 ENCOUNTER — Other Ambulatory Visit: Payer: Medicaid Other

## 2018-06-08 ENCOUNTER — Other Ambulatory Visit: Payer: Self-pay

## 2018-06-08 DIAGNOSIS — M1000 Idiopathic gout, unspecified site: Secondary | ICD-10-CM

## 2018-06-09 ENCOUNTER — Other Ambulatory Visit: Payer: Self-pay | Admitting: Urology

## 2018-06-09 DIAGNOSIS — M1000 Idiopathic gout, unspecified site: Secondary | ICD-10-CM

## 2018-06-09 LAB — URIC ACID: Uric Acid: 5.6 mg/dL (ref 2.5–7.1)

## 2018-06-30 ENCOUNTER — Telehealth: Payer: Self-pay | Admitting: Pharmacist

## 2018-06-30 NOTE — Telephone Encounter (Signed)
06/30/2018 9:47:02 AM - [employer] renewal-Advair & Ventolin  06/30/2018 Faxed [employer] renewal application for Advair 548/32 Inhale 1 puff into the lungs two times a day, & Ventolin HFA 35mcg Inhale 2 puffs every 4 hours as needed for cough or wheezing.Delos Haring

## 2018-07-03 ENCOUNTER — Other Ambulatory Visit: Payer: Self-pay | Admitting: Internal Medicine

## 2018-07-03 DIAGNOSIS — I10 Essential (primary) hypertension: Secondary | ICD-10-CM

## 2018-07-25 ENCOUNTER — Ambulatory Visit: Payer: Medicaid Other | Admitting: Pharmacist

## 2018-07-25 ENCOUNTER — Encounter: Payer: Self-pay | Admitting: Pharmacist

## 2018-07-25 DIAGNOSIS — Z79899 Other long term (current) drug therapy: Secondary | ICD-10-CM

## 2018-07-25 NOTE — Progress Notes (Signed)
[facility] Phone Visit Note  Patient: Gabrielle Schultz MRN: [id number] Date of Birth: October 31, 1970 PCP: Boyce Medici, FNP   [name] 48 y.o. female was contacted today for a medication therapy management visit. As this visit was conducted over the phone, two patient identifiers were used to verify pt's identity.  There were no vitals taken for this visit.  Patient Information   Past Medical History:  Diagnosis Date  . Acid reflux   . Allergic rhinitis 06/26/2015  . Allergy   . Asthma   . Carpal tunnel syndrome    right hand  . Chronic kidney disease   . Colitis   . CVA (cerebral infarction)    2009, 2015  . History of syncope   . Hypertension 2008  . Low HDL (under 40) 06/26/2015  . MI (myocardial infarction) ([place])    2015 - patient wasnt told by cardiologist that she had MI but has had cardiac cath in past  . Migraines   . Poor circulation   . Renal insufficiency   . Stroke ([place])   . Syncope and collapse       Past Surgical History:  Procedure Laterality Date  . CESAREAN SECTION  1994   Dr. Ammie Dalton  . COLONOSCOPY WITH PROPOFOL N/A 06/26/2014   Procedure: COLONOSCOPY WITH PROPOFOL;  Surgeon: Josefine Class, MD;  Location: [facility] ENDOSCOPY;  Service: Endoscopy;  Laterality: N/A;  . DILATION AND CURETTAGE OF UTERUS  x2 for incomplete SABs     Family History  Problem Relation Age of Onset  . Hyperlipidemia Mother   . Heart disease Mother        has a defibrillator  . COPD Mother   . Hypertension Father   . Heart disease Father   . Hyperlipidemia Father   . Hypertension Sister        died in her 64s from heart failure and end stage kidney disease  . Kidney disease Sister   . Hypertension Brother   . Heart disease Brother        has a defibrillator  . Coronary artery disease Brother        had CABG   Outpatient Encounter Medications as of 07/25/2018  Medication Sig  . albuterol (VENTOLIN HFA) 108 (90 Base) MCG/ACT inhaler INHALE 2  PUFFS EVERY 4 HOURS AS NEEDED FOR COUGH AND WHEEZING.  Marland Kitchen allopurinol (ZYLOPRIM) 100 MG tablet Take 1 tablet (100 mg total) by mouth daily.  Marland Kitchen amLODipine (NORVASC) 10 MG tablet TAKE ONE TABLET BY MOUTH EVERY DAY  . aspirin 81 MG chewable tablet Chew 81 mg by mouth daily.  . cetirizine (ZYRTEC) 10 MG tablet Take 1 tablet (10 mg total) by mouth daily.  . cloNIDine (CATAPRES) 0.3 MG tablet TAKE ONE TABLET BY MOUTH 3 TIMES A DAY  . fluticasone (FLONASE) 50 MCG/ACT nasal spray PLACE 1 SPRAY INTO BOTH NOSTRILS 2 TIMES A DAY  . Fluticasone-Salmeterol (ADVAIR) 100-50 MCG/DOSE AEPB Inhale 1 puff into the lungs 2 (two) times daily.  . furosemide (LASIX) 40 MG tablet TAKE ONE TABLET BY MOUTH EVERY DAY  . omeprazole (PRILOSEC) 20 MG capsule Take 1 capsule (20 mg total) by mouth daily.  . potassium chloride SA (K-DUR) 20 MEQ tablet TAKE ONE TABLET BY MOUTH EVERY DAY  . ranitidine (ZANTAC) 150 MG tablet Take 1 tablet (150 mg total) by mouth 2 (two) times daily.  . mometasone (ELOCON) 0.1 % cream APPLY ONE APPLICATION TOPICALLY EVERY DAY AS NEEDED (Patient not  taking: Reported on 07/25/2018)  . montelukast (SINGULAIR) 10 MG tablet Take 1 tablet (10 mg total) by mouth at bedtime. (Patient not taking: Reported on 07/25/2018)  . predniSONE (STERAPRED UNI-PAK 21 TAB) 5 MG (21) TBPK tablet 6 tabs day 1, 5 tabs day 2, 4 tabs day 3, 3 tabs day 4, 2 tabs day 5, 1 tab day 6 (Patient not taking: Reported on 07/25/2018)  . promethazine (PHENERGAN) 25 MG tablet Take 1 tablet (25 mg total) by mouth every 6 (six) hours as needed for nausea or vomiting. (Patient not taking: Reported on 07/25/2018)  . [DISCONTINUED] ciprofloxacin (CIPRO) 250 MG tablet Take 1 tablet (250 mg total) by mouth 2 (two) times daily. (Patient not taking: Reported on 01/26/2018)  . [DISCONTINUED] loperamide (IMODIUM) 2 MG capsule Take 2 mg by mouth as needed for diarrhea or loose stools.   No facility-administered encounter medications on file as of  07/25/2018.     New Diagnoses (since last visit): Gout  Family Support: Good  Lifestyle Diet: Brunch: cereal and milk (typically rice krispies, rice chex, or frosted mini wheats) Dinner: meat (chicken or pork chops), green vegetable, and starch (potatoes, mac n cheese) Drinks: juice, sweet tea, Mt Dew, water Exercise: walks for hours around [employer] at least twice per week   Social History   Substance and Sexual Activity  Alcohol Use Yes   Comment: "seldom" - drinks 350-781m when she does drink      Social History   Tobacco Use  Smoking Status Current Every Day Smoker  . Packs/day: 0.25  . Years: 18.00  . Pack years: 4.50  . Types: Cigarettes  . Start date: 03/04/2017  Smokeless Tobacco Never Used  Tobacco Comment   pt quit in 07/2016 but restarted       Health Maintenance  Topic Date Due  . PNEUMOCOCCAL POLYSACCHARIDE VACCINE AGE 85-64 HIGH RISK  11/17/1974  . FOOT EXAM  11/17/1982  . OPHTHALMOLOGY EXAM  11/17/1982  . TETANUS/TDAP  11/17/1991  . PAP SMEAR-Modifier  11/16/1993  . INFLUENZA VACCINE  08/05/2018  . HEMOGLOBIN A1C  09/23/2018  . HIV Screening  Completed     Assessment and Plan:  1. Adherence Pt reports never forgetting to take medications but reports only being able to pick up meds on Fridays d/t husband's work schedule. Rx fill hx indicates compliance.   2. Cardiac (HTN, hx of stroke, MI, LVH) Hx of HTN, stroke, MI, LVH on amlodipine 156mdaily, clonidine 0.15m75mID, furosemide 38m110mily, potassium 20mE39mily, aspirin 81mg 35my. Pt reports BP readings on avg 130-70's (confirmed via clinic notes). Lipid panel (12/2017) LDL 126, HDL 24, TG 224. Pt reports cuff she has at home is too large but that she feels her BP is well controlled. Pt reports that she was previously on simvastatin prior to pregnancy but was taken off and never restarted. Will F/U with PCP regarding medication status.   3. Asthma, allergies Hx of asthma and seasonal allergies on  albuterol PRN, Advair 1 puff BID, cetirizine 10mg d37m, Flonase 1 spray BID. Pt reports needing to use albuterol inhaler every time pt goes outside d/t heat but otherwise feels asthma and allergies are well controlled. Pt was prescribed montelukast in June 2020 but experienced rash and subsequently stopped med.   4. GERD Hx of GERD on omeprazole 20mg da5m Pt was switched from ranitidine 150mg BID115momeprazole when Zantac was pulled off the market and reports initial relief from omeprazole in the AM but bothersome heartburn  in the PM. Recommended pt request an Rx for either omeprazole 89m or twice daily from PCP or trying Pepcid OTC. Pt verbalized understanding of options.   5. Gout Pt recently dx with gout on allopurinol 1067mdaily. Pt reports leg pain & cramps nightly similar to gout flares upon diagnosis. Recommended pt F/U with PCP and mentioned possibility of colchicine but would need to be renally adjusted (CrCl <30). Pt is not a candidate for NSAIDs d/t CKD and hx of MI, stroke.   6. Smoking Cessation Pt reports smoking roughly 0.5ppd and was not interested in smoking cessation resources at this time.   RTC: 1y (07/25/2019)  EmLadoris GenePharmD Candidate [facility]  Cosigned by: ChNetta NeatPharmD, BCWhite City ClinicALarue D Carter [phone]

## 2018-07-26 ENCOUNTER — Other Ambulatory Visit: Payer: Self-pay

## 2018-08-24 ENCOUNTER — Other Ambulatory Visit: Payer: Self-pay | Admitting: Urology

## 2018-08-24 ENCOUNTER — Other Ambulatory Visit: Payer: Self-pay | Admitting: Internal Medicine

## 2018-08-24 DIAGNOSIS — I10 Essential (primary) hypertension: Secondary | ICD-10-CM

## 2018-09-07 ENCOUNTER — Other Ambulatory Visit: Payer: Self-pay

## 2018-09-07 ENCOUNTER — Other Ambulatory Visit: Payer: Medicaid Other

## 2018-09-07 ENCOUNTER — Other Ambulatory Visit: Payer: Self-pay | Admitting: Internal Medicine

## 2018-09-07 ENCOUNTER — Other Ambulatory Visit: Payer: Self-pay | Admitting: Gerontology

## 2018-09-07 DIAGNOSIS — M1000 Idiopathic gout, unspecified site: Secondary | ICD-10-CM

## 2018-09-07 DIAGNOSIS — I10 Essential (primary) hypertension: Secondary | ICD-10-CM

## 2018-09-12 ENCOUNTER — Other Ambulatory Visit: Payer: Self-pay | Admitting: Gerontology

## 2018-09-12 DIAGNOSIS — Z0000 Encounter for general adult medical examination without abnormal findings: Secondary | ICD-10-CM

## 2018-09-12 DIAGNOSIS — M1000 Idiopathic gout, unspecified site: Secondary | ICD-10-CM

## 2018-09-14 ENCOUNTER — Ambulatory Visit: Payer: Medicaid Other

## 2018-09-14 ENCOUNTER — Other Ambulatory Visit: Payer: Self-pay

## 2018-09-14 ENCOUNTER — Other Ambulatory Visit: Payer: Medicaid Other

## 2018-09-14 DIAGNOSIS — Z0000 Encounter for general adult medical examination without abnormal findings: Secondary | ICD-10-CM

## 2018-09-14 DIAGNOSIS — M1000 Idiopathic gout, unspecified site: Secondary | ICD-10-CM

## 2018-09-15 LAB — COMPREHENSIVE METABOLIC PANEL
ALT: 11 IU/L (ref 0–32)
AST: 9 IU/L (ref 0–40)
Albumin/Globulin Ratio: 1.6 (ref 1.2–2.2)
Albumin: 4 g/dL (ref 3.8–4.8)
Alkaline Phosphatase: 156 IU/L — ABNORMAL HIGH (ref 39–117)
BUN/Creatinine Ratio: 12 (ref 9–23)
BUN: 22 mg/dL (ref 6–24)
Bilirubin Total: <0.2 mg/dL (ref 0.0–1.2)
CO2: 20 mmol/L (ref 20–29)
Calcium: 9.1 mg/dL (ref 8.7–10.2)
Chloride: 104 mmol/L (ref 96–106)
Creatinine, Ser: 1.9 mg/dL — ABNORMAL HIGH (ref 0.57–1.00)
GFR calc Af Amer: 36 mL/min/{1.73_m2} — ABNORMAL LOW (ref >59)
GFR calc non Af Amer: 31 mL/min/{1.73_m2} — ABNORMAL LOW (ref >59)
Globulin, Total: 2.5 g/dL (ref 1.5–4.5)
Glucose: 71 mg/dL (ref 65–99)
Potassium: 4.3 mmol/L (ref 3.5–5.2)
Sodium: 137 mmol/L (ref 134–144)
Total Protein: 6.5 g/dL (ref 6.0–8.5)

## 2018-09-15 LAB — URIC ACID: Uric Acid: 6.1 mg/dL (ref 2.5–7.1)

## 2018-09-21 ENCOUNTER — Ambulatory Visit: Payer: Medicaid Other | Admitting: Gerontology

## 2018-09-21 ENCOUNTER — Other Ambulatory Visit: Payer: Self-pay

## 2018-09-21 DIAGNOSIS — E786 Lipoprotein deficiency: Secondary | ICD-10-CM

## 2018-09-21 DIAGNOSIS — I10 Essential (primary) hypertension: Secondary | ICD-10-CM

## 2018-09-21 DIAGNOSIS — J3089 Other allergic rhinitis: Secondary | ICD-10-CM

## 2018-09-21 DIAGNOSIS — N184 Chronic kidney disease, stage 4 (severe): Secondary | ICD-10-CM

## 2018-09-21 DIAGNOSIS — R600 Localized edema: Secondary | ICD-10-CM

## 2018-09-21 MED ORDER — CLONIDINE HCL 0.3 MG PO TABS: ORAL_TABLET | ORAL | 1 refills | Status: DC

## 2018-09-21 MED ORDER — CETIRIZINE HCL 10 MG PO TABS: 10.0000 mg | ORAL_TABLET | Freq: Every day | ORAL | 2 refills | Status: DC

## 2018-09-21 NOTE — Progress Notes (Signed)
Established Patient Office Visit  Subjective:  Patient ID: Gabrielle Schultz, female    DOB: 1970/07/15  Age: 48 y.o. MRN: [id number]  CC: No chief complaint on file. Patient consents to telephone visit and 2 patient identifiers was used to identify patient.  HPI [name] presents for follow up of hypertension, lab review and medication refill. She states that she's compliant with her medications, doesn't check her blood pressure at home and adheres to low sodium diet. Her serum creatinine done 09/14/18, decreased from 2.30 six months ago to 1.90 mg/dl. Her eGFR for African American increased from 29 to 29 . She c/o constant edema to her feet, she takes 40 mg Furosemide and 20 mEQ potassium. She declines referral to [facility]. She reports that she's doing well and will prefer an in clinic appointment, she offers no further concern.  Past Medical History:  Diagnosis Date  . Acid reflux   . Allergic rhinitis 06/26/2015  . Allergy   . Asthma   . Carpal tunnel syndrome    right hand  . Chronic kidney disease   . Colitis   . CVA (cerebral infarction)    2009, 2015  . History of syncope   . Hypertension 2008  . Low HDL (under 40) 06/26/2015  . MI (myocardial infarction) ([place])    2015 - patient wasnt told by cardiologist that she had MI but has had cardiac cath in past  . Migraines   . Poor circulation   . Renal insufficiency   . Stroke ([place])   . Syncope and collapse     Past Surgical History:  Procedure Laterality Date  . CESAREAN SECTION  1994   Dr. Ammie Dalton  . COLONOSCOPY WITH PROPOFOL N/A 06/26/2014   Procedure: COLONOSCOPY WITH PROPOFOL;  Surgeon: Josefine Class, MD;  Location: [facility] ENDOSCOPY;  Service: Endoscopy;  Laterality: N/A;  . DILATION AND CURETTAGE OF UTERUS  x2 for incomplete SABs    Family History  Problem Relation Age of Onset  . Hyperlipidemia Mother   . Heart disease Mother        has a defibrillator  . COPD Mother   . Hypertension Father    . Heart disease Father   . Hyperlipidemia Father   . Hypertension Sister        died in her 66s from heart failure and end stage kidney disease  . Kidney disease Sister   . Hypertension Brother   . Heart disease Brother        has a defibrillator  . Coronary artery disease Brother        had CABG    Social History   Socioeconomic History  . Marital status: Married    Spouse name: Not on file  . Number of children: 1  . Years of education: Not on file  . Highest education level: Not on file  Occupational History  . Not on file  Social Needs  . Financial resource strain: Hard  . Food insecurity    Worry: Sometimes true    Inability: Sometimes true  . Transportation needs    Medical: Yes    Non-medical: Yes  Tobacco Use  . Smoking status: Current Every Day Smoker    Packs/day: 0.25    Years: 18.00    Pack years: 4.50    Types: Cigarettes    Start date: 03/04/2017  . Smokeless tobacco: Never Used  . Tobacco comment: pt quit in 07/2016 but restarted   Substance and  Sexual Activity  . Alcohol use: Yes    Comment: "seldom" - drinks 350-755m when she does drink  . Drug use: No  . Sexual activity: Yes    Partners: Male    Birth control/protection: None  Lifestyle  . Physical activity    Days per week: 2 days    Minutes per session: 60 min  . Stress: Very much  Relationships  . Social [place] phone: More than three times a week    Gets together: Never    Attends religious service: Never    Active member of club or organization: No    Attends meetings of clubs or organizations: Never    Relationship status: Married  . Intimate partner violence    Fear of current or ex partner: No    Emotionally abused: No    Physically abused: No    Forced sexual activity: No  Other Topics Concern  . Not on file  Social History Narrative   Husband is a tAdministratorfor [employer]and is the sole provider for the family including 2 children (268yr old and 121 yr old)       Patient filled out [place] [facility] Consent Form.    Outpatient Medications Prior to Visit  Medication Sig Dispense Refill  . albuterol (VENTOLIN HFA) 108 (90 Base) MCG/ACT inhaler INHALE 2 PUFFS EVERY 4 HOURS AS NEEDED FOR COUGH AND WHEEZING. 54 g 1  . allopurinol (ZYLOPRIM) 100 MG tablet Take 1 tablet (100 mg total) by mouth daily. 30 tablet 11  . amLODipine (NORVASC) 10 MG tablet TAKE ONE TABLET BY MOUTH EVERY DAY 30 tablet 0  . aspirin 81 MG chewable tablet Chew 81 mg by mouth daily.    . fluticasone (FLONASE) 50 MCG/ACT nasal spray PLACE 1 SPRAY INTO BOTH NOSTRILS 2 TIMES A DAY 16 g 0  . Fluticasone-Salmeterol (ADVAIR) 100-50 MCG/DOSE AEPB Inhale 1 puff into the lungs 2 (two) times daily. 1 each 3  . furosemide (LASIX) 40 MG tablet TAKE ONE TABLET BY MOUTH EVERY DAY 30 tablet 0  . omeprazole (PRILOSEC) 20 MG capsule Take 1 capsule (20 mg total) by mouth daily. 90 capsule 0  . potassium chloride SA (K-DUR) 20 MEQ tablet TAKE ONE TABLET BY MOUTH EVERY DAY 60 tablet 0  . ranitidine (ZANTAC) 150 MG tablet Take 1 tablet (150 mg total) by mouth 2 (two) times daily. 60 tablet 3  . cetirizine (ZYRTEC) 10 MG tablet Take 1 tablet (10 mg total) by mouth daily. 30 tablet 2  . cloNIDine (CATAPRES) 0.3 MG tablet TAKE ONE TABLET BY MOUTH 3 TIMES A DAY 90 tablet 0  . mometasone (ELOCON) 0.1 % cream APPLY ONE APPLICATION TOPICALLY EVERY DAY AS NEEDED (Patient not taking: Reported on 07/25/2018) 45 g 0  . montelukast (SINGULAIR) 10 MG tablet Take 1 tablet (10 mg total) by mouth at bedtime. (Patient not taking: Reported on 07/25/2018) 90 tablet 0  . predniSONE (STERAPRED UNI-PAK 21 TAB) 5 MG (21) TBPK tablet 6 tabs day 1, 5 tabs day 2, 4 tabs day 3, 3 tabs day 4, 2 tabs day 5, 1 tab day 6 (Patient not taking: Reported on 07/25/2018) 21 tablet 0  . promethazine (PHENERGAN) 25 MG tablet Take 1 tablet (25 mg total) by mouth every 6 (six) hours as needed for nausea or vomiting. (Patient not taking: Reported on  07/25/2018) 30 tablet 0   No facility-administered medications prior to visit.  Allergies  Allergen Reactions  . Coconut Oil Hives and Swelling  . Amoxicillin Other (See Comments)    "makes me sick" Has patient had a PCN reaction causing immediate rash, facial/tongue/throat swelling, SOB or lightheadedness with hypotension: No Has patient had a PCN reaction causing severe rash involving mucus membranes or skin necrosis: No Has patient had a PCN reaction that required hospitalization: No Has patient had a PCN reaction occurring within the last 10 years: No If all of the above answers are "NO", then may proceed with Cephalosporin use.    Marland Kitchen Penicillins Other (See Comments)    "makes me sick" Has patient had a PCN reaction causing immediate rash, facial/tongue/throat swelling, SOB or lightheadedness with hypotension: No Has patient had a PCN reaction causing severe rash involving mucus membranes or skin necrosis: No Has patient had a PCN reaction that required hospitalization: No Has patient had a PCN reaction occurring within the last 10 years: Yes If all of the above answers are "NO", then may proceed with Cephalosporin use.   Marland Kitchen Singulair [Montelukast Sodium] Rash    ROS Review of Systems  Constitutional: Negative.   Respiratory: Negative.   Cardiovascular: Positive for leg swelling (feet).  Genitourinary: Negative.   Skin: Negative.   Neurological: Negative.   Psychiatric/Behavioral: Negative.       Objective:    Physical Exam No vital or PE was done There were no vitals taken for this visit. Wt Readings from Last 3 Encounters:  01/26/18 163 lb 3.2 oz (74 kg)  12/15/17 160 lb 3.2 oz (72.7 kg)  10/06/17 156 lb 6.4 oz (70.9 kg)     Health Maintenance Due  Topic Date Due  . PNEUMOCOCCAL POLYSACCHARIDE VACCINE AGE 61-64 HIGH RISK  11/17/1974  . FOOT EXAM  11/17/1982  . OPHTHALMOLOGY EXAM  11/17/1982  . TETANUS/TDAP  11/17/1991  . PAP SMEAR-Modifier  11/16/1993   . INFLUENZA VACCINE  08/05/2018  . HEMOGLOBIN A1C  09/23/2018    There are no preventive care reminders to display for this patient.  Lab Results  Component Value Date   TSH 1.750 12/15/2017   Lab Results  Component Value Date   WBC 8.9 12/15/2017   HGB 11.9 12/15/2017   HCT 36.3 12/15/2017   MCV 88 12/15/2017   PLT 292 12/15/2017   Lab Results  Component Value Date   NA 137 09/14/2018   K 4.3 09/14/2018   CO2 20 09/14/2018   GLUCOSE 71 09/14/2018   BUN 22 09/14/2018   CREATININE 1.90 (H) 09/14/2018   BILITOT <0.2 09/14/2018   ALKPHOS 156 (H) 09/14/2018   AST 9 09/14/2018   ALT 11 09/14/2018   PROT 6.5 09/14/2018   ALBUMIN 4.0 09/14/2018   CALCIUM 9.1 09/14/2018   ANIONGAP 8 06/22/2017   Lab Results  Component Value Date   CHOL 195 12/15/2017   Lab Results  Component Value Date   HDL 24 (L) 12/15/2017   Lab Results  Component Value Date   LDLCALC 126 (H) 12/15/2017   Lab Results  Component Value Date   TRIG 224 (H) 12/15/2017   Lab Results  Component Value Date   CHOLHDL 8.1 (H) 12/15/2017   Lab Results  Component Value Date   HGBA1C 5.5 03/23/2018      Assessment & Plan:    1. Essential hypertension - She will continue on current treatment regimen. -Low salt DASH diet -Take medications regularly on time -Exercise regularly as tolerated -Check blood pressure at least once a week at  home or a nearby pharmacy and record -Goal is less than 140/90 and normal blood pressure is 120/80 - cloNIDine (CATAPRES) 0.3 MG tablet; TAKE ONE TABLET BY MOUTH 3 TIMES A DAY  Dispense: 90 tablet; Refill: 1  2. CKD (chronic kidney disease), stage IV ([place]) - She will be referred to [facility] in [facility] since she declines [facility] clinic. Will recheck BMP. - Ambulatory referral to Nephrology  3. Allergic rhinitis due to other allergic trigger, unspecified seasonality - Controlled, she will continue on current treatment regimen. - cetirizine  (ZYRTEC) 10 MG tablet; Take 1 tablet (10 mg total) by mouth daily.  Dispense: 30 tablet; Refill: 2  4. Edema of both feet - She was advised to elevate leg while sitting down, referral sent to Nephrologist. - She will continue on Furosemide and will recheck BMP.   Follow-up: Return in about 2 weeks (around 10/05/2018), or if symptoms worsen or fail to improve.    Arien Morine Jerold Coombe, NP

## 2018-09-23 DIAGNOSIS — R600 Localized edema: Secondary | ICD-10-CM | POA: Insufficient documentation

## 2018-10-05 ENCOUNTER — Other Ambulatory Visit: Payer: Self-pay

## 2018-10-05 ENCOUNTER — Ambulatory Visit: Payer: Medicaid Other | Admitting: Urology

## 2018-10-05 VITALS — BP 120/60 | HR 110 | Temp 97.3°F | Ht 61.0 in | Wt 163.1 lb

## 2018-10-05 DIAGNOSIS — M7989 Other specified soft tissue disorders: Secondary | ICD-10-CM

## 2018-10-05 DIAGNOSIS — R252 Cramp and spasm: Secondary | ICD-10-CM

## 2018-10-05 DIAGNOSIS — I10 Essential (primary) hypertension: Secondary | ICD-10-CM

## 2018-10-05 MED ORDER — CLONIDINE HCL 0.3 MG PO TABS: ORAL_TABLET | ORAL | 2 refills | Status: DC

## 2018-10-05 MED ORDER — FUROSEMIDE 40 MG PO TABS: 40.0000 mg | ORAL_TABLET | Freq: Every day | ORAL | 3 refills | Status: DC

## 2018-10-05 NOTE — Progress Notes (Signed)
Patient: Gabrielle Schultz Female    DOB: June 24, 1970   48 y.o.   MRN: [id number] Visit Date: 10/05/2018  Today's Provider: Zara Council, PA-C   Chief Complaint  Patient presents with  . Follow-up    Had blood work done, had phone visit, was told she needs to come in office   Subjective:    HPI  She has not yet filled out her [facility] papers for nephrology referral   Her right hand has been cramping up for the last two weeks.  She starts to experience the cramping when she uses the hand.  This is the side that she had the stroke.  Braces worn at night do not provide relief.    Left leg is swelling when she walks for several minutes.  She has pain all around the calves.  Weak pedal pulses.  This has been going on for several months.    Allergies  Allergen Reactions  . Coconut Oil Hives and Swelling  . Amoxicillin Other (See Comments)    "makes me sick" Has patient had a PCN reaction causing immediate rash, facial/tongue/throat swelling, SOB or lightheadedness with hypotension: No Has patient had a PCN reaction causing severe rash involving mucus membranes or skin necrosis: No Has patient had a PCN reaction that required hospitalization: No Has patient had a PCN reaction occurring within the last 10 years: No If all of the above answers are "NO", then may proceed with Cephalosporin use.    Marland Kitchen Penicillins Other (See Comments)    "makes me sick" Has patient had a PCN reaction causing immediate rash, facial/tongue/throat swelling, SOB or lightheadedness with hypotension: No Has patient had a PCN reaction causing severe rash involving mucus membranes or skin necrosis: No Has patient had a PCN reaction that required hospitalization: No Has patient had a PCN reaction occurring within the last 10 years: Yes If all of the above answers are "NO", then may proceed with Cephalosporin use.   Marland Kitchen Singulair [Montelukast Sodium] Rash   Previous Medications   ALBUTEROL (VENTOLIN HFA) 108 (90  BASE) MCG/ACT INHALER    INHALE 2 PUFFS EVERY 4 HOURS AS NEEDED FOR COUGH AND WHEEZING.   ALLOPURINOL (ZYLOPRIM) 100 MG TABLET    Take 1 tablet (100 mg total) by mouth daily.   AMLODIPINE (NORVASC) 10 MG TABLET    TAKE ONE TABLET BY MOUTH EVERY DAY   ASPIRIN 81 MG CHEWABLE TABLET    Chew 81 mg by mouth daily.   CETIRIZINE (ZYRTEC) 10 MG TABLET    Take 1 tablet (10 mg total) by mouth daily.   FLUTICASONE (FLONASE) 50 MCG/ACT NASAL SPRAY    PLACE 1 SPRAY INTO BOTH NOSTRILS 2 TIMES A DAY   FLUTICASONE-SALMETEROL (ADVAIR) 100-50 MCG/DOSE AEPB    Inhale 1 puff into the lungs 2 (two) times daily.   OMEPRAZOLE (PRILOSEC) 20 MG CAPSULE    Take 1 capsule (20 mg total) by mouth daily.   POTASSIUM CHLORIDE SA (K-DUR) 20 MEQ TABLET    TAKE ONE TABLET BY MOUTH EVERY DAY   RANITIDINE (ZANTAC) 150 MG TABLET    Take 1 tablet (150 mg total) by mouth 2 (two) times daily.    Review of Systems  Social History   Tobacco Use  . Smoking status: Current Every Day Smoker    Packs/day: 0.50    Years: 31.00    Pack years: 15.50    Types: Cigarettes    Start date: 03/04/2017  . Smokeless tobacco: Never Used  .  Tobacco comment: pt quit in 07/2016 but restarted   Substance Use Topics  . Alcohol use: Not Currently    Comment: "seldom" - drinks 350-714mL when she does drink   Objective:   BP 120/60   Pulse (!) 110   Temp (!) 97.3 F (36.3 C)   Ht 5\' 1"  (1.549 m)   Wt 163 lb 1.6 oz (74 kg)   SpO2 97%   BMI 30.82 kg/m   Physical Exam      Assessment & Plan:   1. Right hand cramping  - referral to neurology   2. HTN  - at goal < 140/70   3. Left leg swelling  - will obtain doppler of leg to rule out DVT          Zara Council, PA-C   [facility]

## 2018-10-06 ENCOUNTER — Encounter: Payer: Self-pay | Admitting: Neurology

## 2018-10-27 ENCOUNTER — Other Ambulatory Visit: Payer: Self-pay | Admitting: Gerontology

## 2018-10-27 DIAGNOSIS — I10 Essential (primary) hypertension: Secondary | ICD-10-CM

## 2018-10-30 NOTE — Progress Notes (Signed)
NEUROLOGY CONSULTATION NOTE  Gabrielle Schultz MRN: [id number] DOB: [birth year]  Referring provider: Zara Council, PA-C Primary care provider: Volney Presser, FNP  Reason for consult:  Hand cramp  HISTORY OF PRESENT ILLNESS: Gabrielle Schultz is a 48 year old right-handed white female with CKD, hypertension and history of CVA who presents for cramping of the right hand.  History supplemented by referring provider note.  She had a stroke at age 60.  She doesn't remember her symptoms.    She had a second stroke in addition to an acute ischemic colitis in 2014.  She does not remember her symptoms but records report right homonymous hemianopsia and right arm and leg weakness and numbness of 2 months duration.   MRI of brain without contrast from 12/29/2012 demonstrated "[R]emote moderate size left occipital/posterior inferior left temporal lobe infarct with small area of involvement of the inferior left parietal lobe.  Associated encephalomalacia and laminar necrosis.  No evidence of acute infarct.  Remote small left frontal lobe infarct.  Mild to moderate nonspecific white matter type changes.Marland KitchenMarland KitchenGlobal atrophy without hydrocephalus."  She reports that no cause for her strokes were ever found.  Following her stroke, she never underwent PT/OT because she had no insurance.  Since the second stroke, she continues to have aching and cramping of her hand down to her elbow with use of her hand.  She also has numbness involving the first 3 digits of her right hand.  She has history of carpal tunnel syndrome diagnosed many years ago, so she tried wearing a brace.  However, it has been ineffective.  When she walks, sometimes it feels like her leg will give out.  2D Echocardiogram from 08/30/2016 demonstrated LVEF 55-65% with severe AR and moderate MR and TR.  LABS:  Hgb A1c from 03/23/2018 was 5.5; LDL from 12/15/2017 was 126.  PAST MEDICAL HISTORY: Past Medical History:  Diagnosis Date  . Acid reflux   .  Allergic rhinitis 06/26/2015  . Allergy   . Asthma   . Carpal tunnel syndrome    right hand  . Chronic kidney disease   . Colitis   . CVA (cerebral infarction)    2009, 2015  . History of syncope   . Hypertension 2008  . Low HDL (under 40) 06/26/2015  . MI (myocardial infarction) ([place])    2015 - patient wasnt told by cardiologist that she had MI but has had cardiac cath in past  . Migraines   . Poor circulation   . Renal insufficiency   . Stroke ([place])   . Syncope and collapse     PAST SURGICAL HISTORY: Past Surgical History:  Procedure Laterality Date  . CESAREAN SECTION  1994   Dr. Ammie Dalton  . COLONOSCOPY WITH PROPOFOL N/A 06/26/2014   Procedure: COLONOSCOPY WITH PROPOFOL;  Surgeon: Josefine Class, MD;  Location: [facility] ENDOSCOPY;  Service: Endoscopy;  Laterality: N/A;  . DILATION AND CURETTAGE OF UTERUS  x2 for incomplete SABs    MEDICATIONS: Current Outpatient Medications on File Prior to Visit  Medication Sig Dispense Refill  . albuterol (VENTOLIN HFA) 108 (90 Base) MCG/ACT inhaler INHALE 2 PUFFS EVERY 4 HOURS AS NEEDED FOR COUGH AND WHEEZING. 54 g 1  . allopurinol (ZYLOPRIM) 100 MG tablet Take 1 tablet (100 mg total) by mouth daily. 30 tablet 11  . amLODipine (NORVASC) 10 MG tablet TAKE ONE TABLET BY MOUTH EVERY DAY 30 tablet 0  . aspirin 81 MG chewable tablet Chew 81 mg by mouth daily.    Marland Kitchen  cetirizine (ZYRTEC) 10 MG tablet Take 1 tablet (10 mg total) by mouth daily. 30 tablet 2  . cloNIDine (CATAPRES) 0.3 MG tablet TAKE ONE TABLET BY MOUTH 3 TIMES A DAY 90 tablet 2  . fluticasone (FLONASE) 50 MCG/ACT nasal spray PLACE 1 SPRAY INTO BOTH NOSTRILS 2 TIMES A DAY 16 g 0  . Fluticasone-Salmeterol (ADVAIR) 100-50 MCG/DOSE AEPB Inhale 1 puff into the lungs 2 (two) times daily. 1 each 3  . furosemide (LASIX) 40 MG tablet Take 1 tablet (40 mg total) by mouth daily. 30 tablet 3  . omeprazole (PRILOSEC) 20 MG capsule Take 1 capsule (20 mg total) by mouth daily. 90 capsule 0  .  potassium chloride SA (K-DUR) 20 MEQ tablet TAKE ONE TABLET BY MOUTH EVERY DAY 60 tablet 0  . ranitidine (ZANTAC) 150 MG tablet Take 1 tablet (150 mg total) by mouth 2 (two) times daily. 60 tablet 3   No current facility-administered medications on file prior to visit.     ALLERGIES: Allergies  Allergen Reactions  . Coconut Oil Hives and Swelling  . Amoxicillin Other (See Comments)    "makes me sick" Has patient had a PCN reaction causing immediate rash, facial/tongue/throat swelling, SOB or lightheadedness with hypotension: No Has patient had a PCN reaction causing severe rash involving mucus membranes or skin necrosis: No Has patient had a PCN reaction that required hospitalization: No Has patient had a PCN reaction occurring within the last 10 years: No If all of the above answers are "NO", then may proceed with Cephalosporin use.    Marland Kitchen Penicillins Other (See Comments)    "makes me sick" Has patient had a PCN reaction causing immediate rash, facial/tongue/throat swelling, SOB or lightheadedness with hypotension: No Has patient had a PCN reaction causing severe rash involving mucus membranes or skin necrosis: No Has patient had a PCN reaction that required hospitalization: No Has patient had a PCN reaction occurring within the last 10 years: Yes If all of the above answers are "NO", then may proceed with Cephalosporin use.   Marland Kitchen Singulair [Montelukast Sodium] Rash    FAMILY HISTORY: Family History  Problem Relation Age of Onset  . Hyperlipidemia Mother   . Heart disease Mother        has a defibrillator  . COPD Mother   . Hypertension Father   . Heart disease Father   . Hyperlipidemia Father   . Hypertension Sister        died in her 92s from heart failure and end stage kidney disease  . Kidney disease Sister   . Hypertension Brother   . Heart disease Brother        has a defibrillator  . Coronary artery disease Brother        had CABG    SOCIAL HISTORY: Social  History   Socioeconomic History  . Marital status: Married    Spouse name: Not on file  . Number of children: 2  . Years of education: 11th grade  . Highest education level: Not on file  Occupational History  . Not on file  Social Needs  . Financial resource strain: Hard  . Food insecurity    Worry: Sometimes true    Inability: Sometimes true  . Transportation needs    Medical: No    Non-medical: No  Tobacco Use  . Smoking status: Current Every Day Smoker    Packs/day: 0.50    Years: 31.00    Pack years: 15.50    Types: Cigarettes  Start date: 03/04/2017  . Smokeless tobacco: Never Used  . Tobacco comment: pt quit in 07/2016 but restarted   Substance and Sexual Activity  . Alcohol use: Not Currently    Comment: "seldom" - drinks 350-744mL when she does drink  . Drug use: No  . Sexual activity: Yes    Partners: Male    Birth control/protection: None  Lifestyle  . Physical activity    Days per week: 2 days    Minutes per session: 60 min  . Stress: Very much  Relationships  . Social Herbalist on phone: More than three times a week    Gets together: Never    Attends religious service: Never    Active member of club or organization: No    Attends meetings of clubs or organizations: Never    Relationship status: Married  . Intimate partner violence    Fear of current or ex partner: No    Emotionally abused: No    Physically abused: No    Forced sexual activity: No  Other Topics Concern  . Not on file  Social History Narrative   Husband is a Administrator for [employer] and is the sole provider for the family including 2 children (36 yr old and 67 yr old)    Goes to food bank for food. Says "really worried" about water and light bill. Husband, baby, 45 year old, her, and mother all live together.    Patient would like appointment with [facility].     REVIEW OF SYSTEMS: Constitutional: No fevers, chills, or sweats, no generalized fatigue, change in  appetite Eyes: No visual changes, double vision, eye pain Ear, nose and throat: No hearing loss, ear pain, nasal congestion, sore throat Cardiovascular: No chest pain, palpitations Respiratory:  No shortness of breath at rest or with exertion, wheezes GastrointestinaI: No nausea, vomiting, diarrhea, abdominal pain, fecal incontinence Genitourinary:  No dysuria, urinary retention or frequency Musculoskeletal:  No neck pain, back pain Integumentary: No rash, pruritus, skin lesions Neurological: as above Psychiatric: No depression, insomnia, anxiety Endocrine: No palpitations, fatigue, diaphoresis, mood swings, change in appetite, change in weight, increased thirst Hematologic/Lymphatic:  No purpura, petechiae. Allergic/Immunologic: no itchy/runny eyes, nasal congestion, recent allergic reactions, rashes  PHYSICAL EXAM: Blood pressure 137/82, pulse 71, height 5\' 1"  (1.549 m), weight 166 lb 1.6 oz (75.3 kg), SpO2 98 %. General: No acute distress.  Patient appears well-groomed.   Head:  Normocephalic/atraumatic Eyes:  fundi examined but not visualized Neck: supple, no paraspinal tenderness, full range of motion Back: No paraspinal tenderness Heart: regular rate and rhythm Lungs: Clear to auscultation bilaterally. Vascular: No carotid bruits. Neurological Exam: Mental status: alert and oriented to person, place, and time, recent and remote memory intact, fund of knowledge intact, attention and concentration intact, speech fluent and not dysarthric, language intact. Cranial nerves: CN I: not tested CN II: pupils equal, round and reactive to light, right homonymous hemianopsia CN III, IV, VI:  full range of motion, no nystagmus, no ptosis CN V: facial sensation intact CN VII: upper and lower face symmetric CN VIII: hearing intact CN IX, X: gag intact, uvula midline CN XI: sternocleidomastoid and trapezius muscles intact CN XII: tongue midline Bulk & Tone: normal, no fasciculations.  Motor:  5/5 throughout Sensation:  Light touch sensation reduced in first 3 digits of right hand.  Temperature and vibration sensation normal in feet. Deep Tendon Reflexes:  2+ throughout, toes downgoing.  Finger to nose testing:  Without  dysmetria.   Heel to shin:  Without dysmetria.  * Gait:  Normal station and stride.  Able to turn and tandem walk. Romberg negative.  IMPRESSION: 1.  I suspect her right upper extremity symptoms are more likely related to carpal tunnel syndrome rather than residual from stroke. 2.  Right homonymous hemianopsia, secondary to CVA 3.  Remote history of CVA x 2 as young adult.  Cryptogenic.  PLAN: 1.  NCV-EMG right upper extremity.  Further recommendations pending results. 2.  For secondary stroke prevention, on ASA and antihypertensive medications.  Would recommend starting a statin as LDL goal should be less than 70.  Thank you for allowing me to take part in the care of this patient.  Metta Clines, DO  CC:  Zara Council, PA-C  Volney Presser, FNP

## 2018-10-31 ENCOUNTER — Other Ambulatory Visit: Payer: Self-pay

## 2018-10-31 ENCOUNTER — Ambulatory Visit (INDEPENDENT_AMBULATORY_CARE_PROVIDER_SITE_OTHER): Payer: Self-pay | Admitting: Neurology

## 2018-10-31 ENCOUNTER — Encounter: Payer: Self-pay | Admitting: Neurology

## 2018-10-31 VITALS — BP 137/82 | HR 71 | Ht 61.0 in | Wt 166.1 lb

## 2018-10-31 DIAGNOSIS — R252 Cramp and spasm: Secondary | ICD-10-CM

## 2018-10-31 DIAGNOSIS — H53461 Homonymous bilateral field defects, right side: Secondary | ICD-10-CM

## 2018-10-31 DIAGNOSIS — G5601 Carpal tunnel syndrome, right upper limb: Secondary | ICD-10-CM

## 2018-10-31 NOTE — Patient Instructions (Signed)
I think the cramping and numbness is the carpal tunnel We will check a nerve study of the hand.  Further recommendations pending results.

## 2018-11-09 ENCOUNTER — Ambulatory Visit: Payer: Medicaid Other | Admitting: Family Medicine

## 2018-11-09 VITALS — BP 144/78 | HR 60 | Temp 97.0°F | Ht 61.0 in | Wt 168.0 lb

## 2018-11-09 DIAGNOSIS — E786 Lipoprotein deficiency: Secondary | ICD-10-CM

## 2018-11-09 DIAGNOSIS — J3489 Other specified disorders of nose and nasal sinuses: Secondary | ICD-10-CM

## 2018-11-09 DIAGNOSIS — I10 Essential (primary) hypertension: Secondary | ICD-10-CM

## 2018-11-09 NOTE — Patient Instructions (Signed)
Health Maintenance, Female Adopting a healthy lifestyle and getting preventive care are important in promoting health and wellness. Ask your health care provider about:  The right schedule for you to have regular tests and exams.  Things you can do on your own to prevent diseases and keep yourself healthy. What should I know about diet, weight, and exercise? Eat a healthy diet   Eat a diet that includes plenty of vegetables, fruits, low-fat dairy products, and lean protein.  Do not eat a lot of foods that are high in solid fats, added sugars, or sodium. Maintain a healthy weight Body mass index (BMI) is used to identify weight problems. It estimates body fat based on height and weight. Your health care provider can help determine your BMI and help you achieve or maintain a healthy weight. Get regular exercise Get regular exercise. This is one of the most important things you can do for your health. Most adults should:  Exercise for at least 150 minutes each week. The exercise should increase your heart rate and make you sweat (moderate-intensity exercise).  Do strengthening exercises at least twice a week. This is in addition to the moderate-intensity exercise.  Spend less time sitting. Even light physical activity can be beneficial. Watch cholesterol and blood lipids Have your blood tested for lipids and cholesterol at 46 years of age, then have this test every 5 years. Have your cholesterol levels checked more often if:  Your lipid or cholesterol levels are high.  You are older than 46 years of age.  You are at high risk for heart disease. What should I know about cancer screening? Depending on your health history and family history, you may need to have cancer screening at various ages. This may include screening for:  Breast cancer.  Cervical cancer.  Colorectal cancer.  Skin cancer.  Lung cancer. What should I know about heart disease, diabetes, and high blood  pressure? Blood pressure and heart disease  High blood pressure causes heart disease and increases the risk of stroke. This is more likely to develop in people who have high blood pressure readings, are of African descent, or are overweight.  Have your blood pressure checked: ? Every 3-5 years if you are 18-39 years of age. ? Every year if you are 40 years old or older. Diabetes Have regular diabetes screenings. This checks your fasting blood sugar level. Have the screening done:  Once every three years after age 40 if you are at a normal weight and have a low risk for diabetes.  More often and at a younger age if you are overweight or have a high risk for diabetes. What should I know about preventing infection? Hepatitis B If you have a higher risk for hepatitis B, you should be screened for this virus. Talk with your health care provider to find out if you are at risk for hepatitis B infection. Hepatitis C Testing is recommended for:  Everyone born from 1945 through 1965.  Anyone with known risk factors for hepatitis C. Sexually transmitted infections (STIs)  Get screened for STIs, including gonorrhea and chlamydia, if: ? You are sexually active and are younger than 46 years of age. ? You are older than 46 years of age and your health care provider tells you that you are at risk for this type of infection. ? Your sexual activity has changed since you were last screened, and you are at increased risk for chlamydia or gonorrhea. Ask your health care provider if   you are at risk.  Ask your health care provider about whether you are at high risk for HIV. Your health care provider may recommend a prescription medicine to help prevent HIV infection. If you choose to take medicine to prevent HIV, you should first get tested for HIV. You should then be tested every 3 months for as long as you are taking the medicine. Pregnancy  If you are about to stop having your period (premenopausal) and  you may become pregnant, seek counseling before you get pregnant.  Take 400 to 800 micrograms (mcg) of folic acid every day if you become pregnant.  Ask for birth control (contraception) if you want to prevent pregnancy. Osteoporosis and menopause Osteoporosis is a disease in which the bones lose minerals and strength with aging. This can result in bone fractures. If you are 65 years old or older, or if you are at risk for osteoporosis and fractures, ask your health care provider if you should:  Be screened for bone loss.  Take a calcium or vitamin D supplement to lower your risk of fractures.  Be given hormone replacement therapy (HRT) to treat symptoms of menopause. Follow these instructions at home: Lifestyle  Do not use any products that contain nicotine or tobacco, such as cigarettes, e-cigarettes, and chewing tobacco. If you need help quitting, ask your health care provider.  Do not use street drugs.  Do not share needles.  Ask your health care provider for help if you need support or information about quitting drugs. Alcohol use  Do not drink alcohol if: ? Your health care provider tells you not to drink. ? You are pregnant, may be pregnant, or are planning to become pregnant.  If you drink alcohol: ? Limit how much you use to 0-1 drink a day. ? Limit intake if you are breastfeeding.  Be aware of how much alcohol is in your drink. In the [place] one drink equals one 12 oz bottle of beer (355 mL), one 5 oz glass of wine (148 mL), or one 1 oz glass of hard liquor (44 mL). General instructions  Schedule regular health, dental, and eye exams.  Stay current with your vaccines.  Tell your health care provider if: ? You often feel depressed. ? You have ever been abused or do not feel safe at home. Summary  Adopting a healthy lifestyle and getting preventive care are important in promoting health and wellness.  Follow your health care provider's instructions about healthy  diet, exercising, and getting tested or screened for diseases.  Follow your health care provider's instructions on monitoring your cholesterol and blood pressure. This information is not intended to replace advice given to you by your health care provider. Make sure you discuss any questions you have with your health care provider. Document Released: 07/06/2010 Document Revised: 12/14/2017 Document Reviewed: 12/14/2017 Elsevier Patient Education  2020 [employer]  

## 2018-11-09 NOTE — Progress Notes (Signed)
Established Patient Office Visit  Subjective:  Patient ID: Gabrielle Schultz, female    DOB: 10-26-1970  Age: 48 y.o. MRN: [id number]  CC: No chief complaint on file.   HPI REBEKAN CRANMER presents for follow up on abnormal kidney function. She reports that she was seen by neurology for her hand cramping and will follow up with them next week. She reports that she is doing well and has some mild sinus pressure x 2 days. Labs were ordered for repeat BMP. Nephrology appt pending.   Past Medical History:  Diagnosis Date  . Acid reflux   . Allergic rhinitis 06/26/2015  . Allergy   . Asthma   . Carpal tunnel syndrome    right hand  . Chronic kidney disease   . Colitis   . CVA (cerebral infarction)    2009, 2015  . History of syncope   . Hypertension 2008  . Low HDL (under 40) 06/26/2015  . MI (myocardial infarction) ([place])    2015 - patient wasnt told by cardiologist that she had MI but has had cardiac cath in past  . Migraines   . Poor circulation   . Renal insufficiency   . Stroke ([place])   . Syncope and collapse     Past Surgical History:  Procedure Laterality Date  . CESAREAN SECTION  1994   Dr. Ammie Dalton  . COLONOSCOPY WITH PROPOFOL N/A 06/26/2014   Procedure: COLONOSCOPY WITH PROPOFOL;  Surgeon: Josefine Class, MD;  Location: [facility] ENDOSCOPY;  Service: Endoscopy;  Laterality: N/A;  . DILATION AND CURETTAGE OF UTERUS  x2 for incomplete SABs    Family History  Problem Relation Age of Onset  . Hyperlipidemia Mother   . Heart disease Mother        has a defibrillator  . COPD Mother   . Hypertension Father   . Heart disease Father   . Hyperlipidemia Father   . Hypertension Sister        died in her 2s from heart failure and end stage kidney disease  . Kidney disease Sister   . Hypertension Brother   . Heart disease Brother        has a defibrillator  . Coronary artery disease Brother        had CABG  . Healthy Child   . Diabetes Child     Social History    Socioeconomic History  . Marital status: Married    Spouse name: Not on file  . Number of children: 2  . Years of education: 11th grade  . Highest education level: 11th grade  Occupational History  . Not on file  Social Needs  . Financial resource strain: Hard  . Food insecurity    Worry: Sometimes true    Inability: Sometimes true  . Transportation needs    Medical: No    Non-medical: No  Tobacco Use  . Smoking status: Current Every Day Smoker    Packs/day: 0.50    Years: 31.00    Pack years: 15.50    Types: Cigarettes    Start date: 03/04/2017  . Smokeless tobacco: Never Used  . Tobacco comment: pt quit in 07/2016 but restarted   Substance and Sexual Activity  . Alcohol use: Not Currently    Comment: "seldom" - drinks 350-759mL when she does drink  . Drug use: No  . Sexual activity: Yes    Partners: Male    Birth control/protection: None  Lifestyle  . Physical activity  Days per week: 2 days    Minutes per session: 60 min  . Stress: Very much  Relationships  . Social Herbalist on phone: More than three times a week    Gets together: Never    Attends religious service: Never    Active member of club or organization: No    Attends meetings of clubs or organizations: Never    Relationship status: Married  . Intimate partner violence    Fear of current or ex partner: No    Emotionally abused: No    Physically abused: No    Forced sexual activity: No  Other Topics Concern  . Not on file  Social History Narrative   Husband is a Administrator for [employer] and is the sole provider for the family including 2 children (58 yr old and 53 yr old)    Goes to food bank for food. Says "really worried" about water and light bill. Husband, baby, 28 year old, her, and mother all live together.    Patient would like appointment with [facility].       Pt is right handed    Outpatient Medications Prior to Visit  Medication Sig Dispense Refill  . albuterol  (VENTOLIN HFA) 108 (90 Base) MCG/ACT inhaler INHALE 2 PUFFS EVERY 4 HOURS AS NEEDED FOR COUGH AND WHEEZING. 54 g 1  . allopurinol (ZYLOPRIM) 100 MG tablet Take 1 tablet (100 mg total) by mouth daily. 30 tablet 11  . amLODipine (NORVASC) 10 MG tablet TAKE ONE TABLET BY MOUTH EVERY DAY 60 tablet 0  . aspirin 81 MG chewable tablet Chew 81 mg by mouth daily.    . cetirizine (ZYRTEC) 10 MG tablet Take 1 tablet (10 mg total) by mouth daily. 30 tablet 2  . cloNIDine (CATAPRES) 0.3 MG tablet TAKE ONE TABLET BY MOUTH 3 TIMES A DAY 90 tablet 2  . fluticasone (FLONASE) 50 MCG/ACT nasal spray PLACE 1 SPRAY INTO BOTH NOSTRILS 2 TIMES A DAY 16 g 0  . Fluticasone-Salmeterol (ADVAIR) 100-50 MCG/DOSE AEPB Inhale 1 puff into the lungs 2 (two) times daily. 1 each 3  . furosemide (LASIX) 40 MG tablet Take 1 tablet (40 mg total) by mouth daily. 30 tablet 3  . omeprazole (PRILOSEC) 20 MG capsule Take 1 capsule (20 mg total) by mouth daily. 90 capsule 0  . potassium chloride SA (K-DUR) 20 MEQ tablet TAKE ONE TABLET BY MOUTH EVERY DAY 60 tablet 0  . ranitidine (ZANTAC) 150 MG tablet Take 1 tablet (150 mg total) by mouth 2 (two) times daily. 60 tablet 3   No facility-administered medications prior to visit.     Allergies  Allergen Reactions  . Coconut Oil Hives and Swelling  . Amoxicillin Other (See Comments)    "makes me sick" Has patient had a PCN reaction causing immediate rash, facial/tongue/throat swelling, SOB or lightheadedness with hypotension: No Has patient had a PCN reaction causing severe rash involving mucus membranes or skin necrosis: No Has patient had a PCN reaction that required hospitalization: No Has patient had a PCN reaction occurring within the last 10 years: No If all of the above answers are "NO", then may proceed with Cephalosporin use.    Marland Kitchen Penicillins Other (See Comments)    "makes me sick" Has patient had a PCN reaction causing immediate rash, facial/tongue/throat swelling, SOB or  lightheadedness with hypotension: No Has patient had a PCN reaction causing severe rash involving mucus membranes or skin necrosis: No Has patient  had a PCN reaction that required hospitalization: No Has patient had a PCN reaction occurring within the last 10 years: Yes If all of the above answers are "NO", then may proceed with Cephalosporin use.   Marland Kitchen Singulair [Montelukast Sodium] Rash    ROS Review of Systems  Constitutional: Negative.   HENT: Positive for sinus pressure.   Eyes: Negative.   Respiratory: Negative.   Cardiovascular: Negative.   Gastrointestinal: Negative.   Endocrine: Negative.   Genitourinary: Negative.   Musculoskeletal: Negative.   Skin: Negative.   Allergic/Immunologic: Negative.   Neurological: Negative.   Hematological: Negative.   Psychiatric/Behavioral: Negative.       Objective:    Physical Exam  Constitutional: She is oriented to person, place, and time. She appears well-developed and well-nourished. No distress.  HENT:  Head: Normocephalic and atraumatic.  Eyes: Pupils are equal, round, and reactive to light. Conjunctivae and EOM are normal.  Neck: Normal range of motion.  Cardiovascular: Normal rate, regular rhythm and normal heart sounds.  Pulmonary/Chest: Effort normal and breath sounds normal. No respiratory distress.  Musculoskeletal: Normal range of motion.  Neurological: She is alert and oriented to person, place, and time.  Skin: Skin is warm and dry.  Psychiatric: She has a normal mood and affect. Her behavior is normal. Judgment and thought content normal.  Nursing note and vitals reviewed.   BP (!) 144/78   Pulse 60   Temp (!) 97 F (36.1 C)   Ht 5\' 1"  (1.549 m)   Wt 168 lb (76.2 kg)   SpO2 100%   BMI 31.74 kg/m  Wt Readings from Last 3 Encounters:  11/09/18 168 lb (76.2 kg)  10/31/18 166 lb 1.6 oz (75.3 kg)  10/05/18 163 lb 1.6 oz (74 kg)     Health Maintenance Due  Topic Date Due  . PNEUMOCOCCAL POLYSACCHARIDE  VACCINE AGE 77-64 HIGH RISK  11/17/1974  . FOOT EXAM  11/17/1982  . OPHTHALMOLOGY EXAM  11/17/1982  . TETANUS/TDAP  11/17/1991  . PAP SMEAR-Modifier  11/16/1993  . INFLUENZA VACCINE  08/05/2018  . HEMOGLOBIN A1C  09/23/2018    There are no preventive care reminders to display for this patient.  Lab Results  Component Value Date   TSH 1.750 12/15/2017   Lab Results  Component Value Date   WBC 8.9 12/15/2017   HGB 11.9 12/15/2017   HCT 36.3 12/15/2017   MCV 88 12/15/2017   PLT 292 12/15/2017   Lab Results  Component Value Date   NA 137 09/14/2018   K 4.3 09/14/2018   CO2 20 09/14/2018   GLUCOSE 71 09/14/2018   BUN 22 09/14/2018   CREATININE 1.90 (H) 09/14/2018   BILITOT <0.2 09/14/2018   ALKPHOS 156 (H) 09/14/2018   AST 9 09/14/2018   ALT 11 09/14/2018   PROT 6.5 09/14/2018   ALBUMIN 4.0 09/14/2018   CALCIUM 9.1 09/14/2018   ANIONGAP 8 06/22/2017   Lab Results  Component Value Date   CHOL 195 12/15/2017   Lab Results  Component Value Date   HDL 24 (L) 12/15/2017   Lab Results  Component Value Date   LDLCALC 126 (H) 12/15/2017   Lab Results  Component Value Date   TRIG 224 (H) 12/15/2017   Lab Results  Component Value Date   CHOLHDL 8.1 (H) 12/15/2017   Lab Results  Component Value Date   HGBA1C 5.5 03/23/2018      Assessment & Plan:   Problem List Items Addressed This Visit  Cardiovascular and Mediastinum   Hypertension     Other   Low HDL (under 40)    Other Visit Diagnoses    Sinus pressure    -  Primary     Advised patient to use otc nasal saline spray for nasal lavage. No need for antibiotics today.  Follow-up: Return in about 3 months (around 02/09/2019) for routine follow up. sooner if needed. Lanae Boast, FNP

## 2018-11-10 LAB — LIPID PANEL
Chol/HDL Ratio: 8.4 ratio — ABNORMAL HIGH (ref 0.0–4.4)
Cholesterol, Total: 218 mg/dL — ABNORMAL HIGH (ref 100–199)
HDL: 26 mg/dL — ABNORMAL LOW (ref >39)
LDL Chol Calc (NIH): 141 mg/dL — ABNORMAL HIGH (ref 0–99)
Triglycerides: 280 mg/dL — ABNORMAL HIGH (ref 0–149)
VLDL Cholesterol Cal: 51 mg/dL — ABNORMAL HIGH (ref 5–40)

## 2018-11-10 LAB — BASIC METABOLIC PANEL
BUN/Creatinine Ratio: 12 (ref 9–23)
BUN: 24 mg/dL (ref 6–24)
CO2: 22 mmol/L (ref 20–29)
Calcium: 9.5 mg/dL (ref 8.7–10.2)
Chloride: 105 mmol/L (ref 96–106)
Creatinine, Ser: 1.96 mg/dL — ABNORMAL HIGH (ref 0.57–1.00)
GFR calc Af Amer: 35 mL/min/{1.73_m2} — ABNORMAL LOW (ref >59)
GFR calc non Af Amer: 30 mL/min/{1.73_m2} — ABNORMAL LOW (ref >59)
Glucose: 85 mg/dL (ref 65–99)
Potassium: 4.1 mmol/L (ref 3.5–5.2)
Sodium: 141 mmol/L (ref 134–144)

## 2018-11-22 ENCOUNTER — Other Ambulatory Visit: Payer: Self-pay

## 2018-11-23 ENCOUNTER — Other Ambulatory Visit: Payer: Self-pay

## 2018-11-23 ENCOUNTER — Ambulatory Visit (INDEPENDENT_AMBULATORY_CARE_PROVIDER_SITE_OTHER): Payer: Self-pay | Admitting: Neurology

## 2018-11-23 DIAGNOSIS — G5601 Carpal tunnel syndrome, right upper limb: Secondary | ICD-10-CM

## 2018-11-23 DIAGNOSIS — R202 Paresthesia of skin: Secondary | ICD-10-CM

## 2018-11-23 DIAGNOSIS — R252 Cramp and spasm: Secondary | ICD-10-CM

## 2018-11-23 NOTE — Procedures (Signed)
[facility]  [address] [place]  [place], [place] [address] Tel: [phone] Fax:  [phone] Test Date:  11/23/2018  Patient: Gabrielle Schultz DOB: 09/11/1970 Physician: Narda Amber, DO  Sex: Female Height: 5\' 1"  Ref Phys: Metta Clines, DO  ID#: [id number] Temp: 34.0C Technician:    Patient Complaints: This is a 48 year old female referred for evaluation of right hand paresthesias.  NCV & EMG Findings: Extensive electrodiagnostic testing of the right upper extremity shows:  1. Right median, ulnar, and mixed palmar sensory responses are within normal limits. 2. Right median and ulnar motor responses are within normal limits. 3. There is no evidence of active or chronic motor axonal loss changes affecting any of the tested muscles.  Motor unit configuration and recruitment pattern is within normal limits.  Impression: This is a normal study of the right upper extremity.  In particular, there is no evidence of carpal tunnel syndrome, ulnar neuropathy, or a cervical radiculopathy.   ___________________________ Narda Amber, DO    Nerve Conduction Studies Anti Sensory Summary Table   Site NR Peak (ms) Norm Peak (ms) P-T Amp (V) Norm P-T Amp  Right Median Anti Sensory (2nd Digit)  34C  Wrist    2.8 <3.4 82.2 >20  Right Ulnar Anti Sensory (5th Digit)  34C  Wrist    2.7 <3.1 34.9 >12   Motor Summary Table   Site NR Onset (ms) Norm Onset (ms) O-P Amp (mV) Norm O-P Amp Site1 Site2 Delta-0 (ms) Dist (cm) Vel (m/s) Norm Vel (m/s)  Right Median Motor (Abd Poll Brev)  34C  Wrist    2.9 <3.9 10.1 >6 Elbow Wrist 3.7 25.0 68 >50  Elbow    6.6  9.6         Right Ulnar Motor (Abd Dig Minimi)  34C  Wrist    1.7 <3.1 7.2 >7 B Elbow Wrist 3.1 20.0 65 >50  B Elbow    4.8  6.4  A Elbow B Elbow 1.8 10.0 56 >50  A Elbow    6.6  6.2          Comparison Summary Table   Site NR Peak (ms) Norm Peak (ms) P-T Amp (V) Site1 Site2 Delta-P (ms) Norm Delta (ms)  Right  Median/Ulnar Palm Comparison (Wrist - 8cm)  34C  Median Palm    1.5 <2.2 131.7 Median Palm Ulnar Palm 0.0   Ulnar Palm    1.5 <2.2 30.7       EMG   Side Muscle Ins Act Fibs Psw Fasc Number Recrt Dur Dur. Amp Amp. Poly Poly. Comment  Right 1stDorInt Nml Nml Nml Nml Nml Nml Nml Nml Nml Nml Nml Nml N/A  Right PronatorTeres Nml Nml Nml Nml Nml Nml Nml Nml Nml Nml Nml Nml N/A  Right Biceps Nml Nml Nml Nml Nml Nml Nml Nml Nml Nml Nml Nml N/A  Right Triceps Nml Nml Nml Nml Nml Nml Nml Nml Nml Nml Nml Nml N/A  Right Deltoid Nml Nml Nml Nml Nml Nml Nml Nml Nml Nml Nml Nml N/A  Right ABD Dig Min Nml Nml Nml Nml Nml Nml Nml Nml Nml Nml Nml Nml N/A      Waveforms:

## 2018-11-24 ENCOUNTER — Telehealth: Payer: Self-pay

## 2018-11-24 DIAGNOSIS — R252 Cramp and spasm: Secondary | ICD-10-CM

## 2018-11-24 NOTE — Telephone Encounter (Signed)
-----   Message from Pieter Partridge, DO sent at 11/24/2018  4:12 PM EST ----- The nerve study is normal.  The cramping is likely from prior stroke.  I recommend referral to occupational therapy for hand cramping.

## 2018-11-24 NOTE — Telephone Encounter (Signed)
Called spoke with patient she was informed of results and understands.  She agrees to OT. She states that her insurance is family planning medicaid and they may not cover therapy. I informed patient that I will order OT and they can contact insurance if a denial will inform provider.

## 2018-11-29 ENCOUNTER — Other Ambulatory Visit: Payer: Self-pay | Admitting: Gerontology

## 2018-11-29 DIAGNOSIS — K219 Gastro-esophageal reflux disease without esophagitis: Secondary | ICD-10-CM

## 2018-12-07 ENCOUNTER — Other Ambulatory Visit: Payer: Self-pay

## 2018-12-07 ENCOUNTER — Ambulatory Visit: Payer: Medicaid Other | Admitting: Urology

## 2018-12-07 DIAGNOSIS — I10 Essential (primary) hypertension: Secondary | ICD-10-CM

## 2018-12-07 DIAGNOSIS — K219 Gastro-esophageal reflux disease without esophagitis: Secondary | ICD-10-CM

## 2018-12-07 DIAGNOSIS — J3089 Other allergic rhinitis: Secondary | ICD-10-CM

## 2018-12-07 MED ORDER — IPRATROPIUM BROMIDE 0.03 % NA SOLN: 2.0000 | Freq: Two times a day (BID) | NASAL | 12 refills | Status: DC

## 2018-12-07 MED ORDER — CLONIDINE HCL 0.3 MG PO TABS: ORAL_TABLET | ORAL | 2 refills | Status: DC

## 2018-12-07 MED ORDER — PROMETHAZINE HCL 25 MG PO TABS: 25.0000 mg | ORAL_TABLET | Freq: Four times a day (QID) | ORAL | 0 refills | Status: DC | PRN

## 2018-12-07 MED ORDER — CETIRIZINE HCL 10 MG PO TABS: 10.0000 mg | ORAL_TABLET | Freq: Every day | ORAL | 2 refills | Status: DC

## 2018-12-07 MED ORDER — AMLODIPINE BESYLATE 10 MG PO TABS: 10.0000 mg | ORAL_TABLET | Freq: Every day | ORAL | 0 refills | Status: DC

## 2018-12-07 MED ORDER — ALBUTEROL SULFATE HFA 108 (90 BASE) MCG/ACT IN AERS: INHALATION_SPRAY | RESPIRATORY_TRACT | 1 refills | Status: DC

## 2018-12-07 MED ORDER — OMEPRAZOLE 20 MG PO CPDR: 20.0000 mg | DELAYED_RELEASE_CAPSULE | Freq: Every day | ORAL | 0 refills | Status: DC

## 2018-12-07 NOTE — Progress Notes (Signed)
Patient: GWENITH COKER Female    DOB: September 20, 1970   48 y.o.   MRN: [id number] Visit Date: 12/07/2018  Today's Provider: Zara Council, PA-C   Chief Complaint  Patient presents with  . Follow-up    nerve test results    Subjective:    HPI  States her nares are really dry, getting specks of blood when she blows her nose - no fever or chills, using Flonase without relief   Allergies  Allergen Reactions  . Coconut Oil Hives and Swelling  . Amoxicillin Other (See Comments)    "makes me sick" Has patient had a PCN reaction causing immediate rash, facial/tongue/throat swelling, SOB or lightheadedness with hypotension: No Has patient had a PCN reaction causing severe rash involving mucus membranes or skin necrosis: No Has patient had a PCN reaction that required hospitalization: No Has patient had a PCN reaction occurring within the last 10 years: No If all of the above answers are "NO", then may proceed with Cephalosporin use.    Marland Kitchen Penicillins Other (See Comments)    "makes me sick" Has patient had a PCN reaction causing immediate rash, facial/tongue/throat swelling, SOB or lightheadedness with hypotension: No Has patient had a PCN reaction causing severe rash involving mucus membranes or skin necrosis: No Has patient had a PCN reaction that required hospitalization: No Has patient had a PCN reaction occurring within the last 10 years: Yes If all of the above answers are "NO", then may proceed with Cephalosporin use.   Marland Kitchen Singulair [Montelukast Sodium] Rash   Previous Medications   ALLOPURINOL (ZYLOPRIM) 100 MG TABLET    Take 1 tablet (100 mg total) by mouth daily.   ASPIRIN 81 MG CHEWABLE TABLET    Chew 81 mg by mouth daily.   FLUTICASONE (FLONASE) 50 MCG/ACT NASAL SPRAY    PLACE 1 SPRAY INTO BOTH NOSTRILS 2 TIMES A DAY   FLUTICASONE-SALMETEROL (ADVAIR) 100-50 MCG/DOSE AEPB    Inhale 1 puff into the lungs 2 (two) times daily.   FUROSEMIDE (LASIX) 40 MG TABLET    Take 1 tablet  (40 mg total) by mouth daily.   POTASSIUM CHLORIDE SA (K-DUR) 20 MEQ TABLET    TAKE ONE TABLET BY MOUTH EVERY DAY   RANITIDINE (ZANTAC) 150 MG TABLET    Take 1 tablet (150 mg total) by mouth 2 (two) times daily.    Review of Systems  Social History   Tobacco Use  . Smoking status: Current Every Day Smoker    Packs/day: 0.50    Years: 31.00    Pack years: 15.50    Types: Cigarettes    Start date: 03/04/2017  . Smokeless tobacco: Never Used  . Tobacco comment: pt quit in 07/2016 but restarted   Substance Use Topics  . Alcohol use: Not Currently    Comment: "seldom" - drinks 350-720mL when she does drink   Objective:   BP (!) 141/67   Pulse (!) 50   Ht 5\' 1"  (1.549 m)   Wt 168 lb 4.8 oz (76.3 kg)   SpO2 98%   BMI 31.80 kg/m   Physical Exam Constitutional:  Well nourished. Alert and oriented, No acute distress. HEENT: [place] AT, moist mucus membranes.  Trachea midline, no masses. Cardiovascular: No clubbing, cyanosis, or edema. Respiratory: Normal respiratory effort, no increased work of breathing. Neurologic: Grossly intact, no focal deficits, moving all 4 extremities. Psychiatric: Normal mood and affect.      Assessment & Plan:   1. Viral sinusitis  Stop Flonase  and switch to Atrovent nasal spray   2. CKD  Still waiting on nephrology referral         Zara Council, PA-C   [facility]

## 2018-12-13 ENCOUNTER — Telehealth: Payer: Self-pay | Admitting: Pharmacist

## 2018-12-13 NOTE — Telephone Encounter (Signed)
12/13/2018 4:08:01 PM - Refill scripts to [facility] for Ventolin&Advair  12/13/2018 Unable to refill online with [facility]-due to script expired 12/08/2018. Printed new scripts for Ventolin HFA & Advair 100/50-taking to [facility] for Shannon to sign & return.Delos Haring

## 2018-12-18 ENCOUNTER — Telehealth: Payer: Self-pay | Admitting: Pharmacist

## 2018-12-18 NOTE — Telephone Encounter (Signed)
12/18/2018 9:16:11 AM - Ventolin & Advair 100/50 scripts to [place] for refill  12/18/2018 [facility] signed the scripts for Ventolin HFA Inhale 2 puffs every 4 hours as needed for coughing and wheezing also Advair 100/50 Inhale 1 puff into the lungs two times a day--Faxed the scripts to [place] for refills.Delos Haring

## 2018-12-23 DIAGNOSIS — R600 Localized edema: Secondary | ICD-10-CM | POA: Insufficient documentation

## 2018-12-23 DIAGNOSIS — I10 Essential (primary) hypertension: Secondary | ICD-10-CM | POA: Insufficient documentation

## 2018-12-23 DIAGNOSIS — N390 Urinary tract infection, site not specified: Secondary | ICD-10-CM | POA: Insufficient documentation

## 2019-01-01 ENCOUNTER — Other Ambulatory Visit: Payer: Self-pay | Admitting: Nephrology

## 2019-01-01 DIAGNOSIS — N1832 Chronic kidney disease, stage 3b: Secondary | ICD-10-CM

## 2019-01-18 ENCOUNTER — Other Ambulatory Visit: Payer: Self-pay

## 2019-01-18 ENCOUNTER — Ambulatory Visit
Admission: RE | Admit: 2019-01-18 | Discharge: 2019-01-18 | Disposition: A | Discharge status: Home / Self Care | Payer: Self-pay | Source: Ambulatory Visit | Attending: Nephrology | Admitting: Nephrology

## 2019-01-18 DIAGNOSIS — N1832 Chronic kidney disease, stage 3b: Secondary | ICD-10-CM | POA: Insufficient documentation

## 2019-02-01 ENCOUNTER — Ambulatory Visit: Payer: Medicaid Other | Admitting: Ophthalmology

## 2019-02-08 ENCOUNTER — Other Ambulatory Visit: Payer: Self-pay

## 2019-02-08 ENCOUNTER — Ambulatory Visit: Payer: Medicaid Other | Admitting: Urology

## 2019-02-08 VITALS — BP 141/70 | HR 53 | Temp 97.4°F | Ht 61.0 in | Wt 174.0 lb

## 2019-02-08 DIAGNOSIS — F419 Anxiety disorder, unspecified: Secondary | ICD-10-CM

## 2019-02-08 DIAGNOSIS — Z889 Allergy status to unspecified drugs, medicaments and biological substances status: Secondary | ICD-10-CM

## 2019-02-08 DIAGNOSIS — I10 Essential (primary) hypertension: Secondary | ICD-10-CM

## 2019-02-08 DIAGNOSIS — J3089 Other allergic rhinitis: Secondary | ICD-10-CM

## 2019-02-08 DIAGNOSIS — E786 Lipoprotein deficiency: Secondary | ICD-10-CM

## 2019-02-08 DIAGNOSIS — N184 Chronic kidney disease, stage 4 (severe): Secondary | ICD-10-CM

## 2019-02-08 DIAGNOSIS — K219 Gastro-esophageal reflux disease without esophagitis: Secondary | ICD-10-CM

## 2019-02-08 DIAGNOSIS — F329 Major depressive disorder, single episode, unspecified: Secondary | ICD-10-CM

## 2019-02-08 DIAGNOSIS — E876 Hypokalemia: Secondary | ICD-10-CM

## 2019-02-08 MED ORDER — ALLOPURINOL 100 MG PO TABS: 100.0000 mg | ORAL_TABLET | Freq: Every day | ORAL | 11 refills | Status: DC

## 2019-02-08 MED ORDER — OMEPRAZOLE 20 MG PO CPDR: 20.0000 mg | DELAYED_RELEASE_CAPSULE | Freq: Every day | ORAL | 0 refills | Status: DC

## 2019-02-08 MED ORDER — ALBUTEROL SULFATE HFA 108 (90 BASE) MCG/ACT IN AERS: INHALATION_SPRAY | RESPIRATORY_TRACT | 1 refills | Status: DC

## 2019-02-08 MED ORDER — FUROSEMIDE 40 MG PO TABS: 40.0000 mg | ORAL_TABLET | Freq: Every day | ORAL | 3 refills | Status: DC

## 2019-02-08 MED ORDER — CETIRIZINE HCL 10 MG PO TABS: 10.0000 mg | ORAL_TABLET | Freq: Every day | ORAL | 2 refills | Status: DC

## 2019-02-08 MED ORDER — PROMETHAZINE HCL 25 MG PO TABS: 25.0000 mg | ORAL_TABLET | Freq: Four times a day (QID) | ORAL | 0 refills | Status: DC | PRN

## 2019-02-08 MED ORDER — IPRATROPIUM BROMIDE 0.03 % NA SOLN: 2.0000 | Freq: Two times a day (BID) | NASAL | 12 refills | Status: DC

## 2019-02-08 MED ORDER — AMLODIPINE BESYLATE 10 MG PO TABS: 10.0000 mg | ORAL_TABLET | Freq: Every day | ORAL | 0 refills | Status: DC

## 2019-02-08 MED ORDER — CLONIDINE HCL 0.3 MG PO TABS: ORAL_TABLET | ORAL | 2 refills | Status: DC

## 2019-02-08 MED ORDER — POTASSIUM CHLORIDE CRYS ER 20 MEQ PO TBCR: 20.0000 meq | EXTENDED_RELEASE_TABLET | Freq: Every day | ORAL | 0 refills | Status: DC

## 2019-02-08 MED ORDER — FLUTICASONE-SALMETEROL 100-50 MCG/DOSE IN AEPB: 1.0000 | INHALATION_SPRAY | Freq: Two times a day (BID) | RESPIRATORY_TRACT | 3 refills | Status: DC

## 2019-02-08 NOTE — Progress Notes (Signed)
Patient: Gabrielle Schultz Female    DOB: 1970/07/19   49 y.o.   MRN: [id number] Visit Date: 02/08/2019  Today's Provider: Zara Council, PA-C   Chief Complaint  Patient presents with  . Follow-up   Subjective:    HPI  HTN  - at goal   - refilled norvasc and catapres   Anxiety  - family stressors   Gout  - refilled allopurinol   GERD  - refilled omeprazole, promethazine  COPD  - refilled advair, albuterol and atrovent nasal spray   Allergies  Allergen Reactions  . Coconut Oil Hives and Swelling  . Amoxicillin Other (See Comments)    "makes me sick" Has patient had a PCN reaction causing immediate rash, facial/tongue/throat swelling, SOB or lightheadedness with hypotension: No Has patient had a PCN reaction causing severe rash involving mucus membranes or skin necrosis: No Has patient had a PCN reaction that required hospitalization: No Has patient had a PCN reaction occurring within the last 10 years: No If all of the above answers are "NO", then may proceed with Cephalosporin use.    Marland Kitchen Penicillins Other (See Comments)    "makes me sick" Has patient had a PCN reaction causing immediate rash, facial/tongue/throat swelling, SOB or lightheadedness with hypotension: No Has patient had a PCN reaction causing severe rash involving mucus membranes or skin necrosis: No Has patient had a PCN reaction that required hospitalization: No Has patient had a PCN reaction occurring within the last 10 years: Yes If all of the above answers are "NO", then may proceed with Cephalosporin use.   Marland Kitchen Singulair [Montelukast Sodium] Rash   Previous Medications   ASPIRIN 81 MG CHEWABLE TABLET    Chew 81 mg by mouth daily.   FLUTICASONE (FLONASE) 50 MCG/ACT NASAL SPRAY    PLACE 1 SPRAY INTO BOTH NOSTRILS 2 TIMES A DAY   RANITIDINE (ZANTAC) 150 MG TABLET    Take 1 tablet (150 mg total) by mouth 2 (two) times daily.    Review of Systems  Social History   Tobacco Use  . Smoking status:  Current Every Day Smoker    Packs/day: 0.50    Years: 31.00    Pack years: 15.50    Types: Cigarettes    Start date: 03/04/2017  . Smokeless tobacco: Never Used  . Tobacco comment: pt quit in 07/2016 but restarted   Substance Use Topics  . Alcohol use: Not Currently    Comment: "seldom" - drinks 350-716mL when she does drink   Objective:   BP (!) 141/70   Pulse (!) 53   Temp (!) 97.4 F (36.3 C)   Ht 5\' 1"  (1.549 m)   Wt 174 lb (78.9 kg)   SpO2 99%   BMI 32.88 kg/m   Physical Exam Constitutional:  Well nourished. Alert and oriented, No acute distress. HEENT: [place] AT, mask in place.  Trachea midline, no masses. Cardiovascular: No clubbing, cyanosis, or edema. Respiratory: Normal respiratory effort, no increased work of breathing. Neurologic: Grossly intact, no focal deficits, moving all 4 extremities. Psychiatric: Normal mood and affect.      Assessment & Plan:   1. Low HDL (under 40) - refilled atrovastatin  2. CKD (chronic kidney disease), stage IV ([place]) - followed by nephrology  3. Essential hypertension - amLODipine (NORVASC) 10 MG tablet; Take 1 tablet (10 mg total) by mouth daily.  Dispense: 60 tablet; Refill: 0 - cloNIDine (CATAPRES) 0.3 MG tablet; TAKE ONE TABLET BY MOUTH 3 TIMES A DAY  Dispense:  90 tablet; Refill: 2 - furosemide (LASIX) 40 MG tablet; Take 1 tablet (40 mg total) by mouth daily.  Dispense: 30 tablet; Refill: 3  4. Anxiety and depression - appointment with Heather   5. Allergic rhinitis due to other allergic trigger, unspecified seasonality - cetirizine (ZYRTEC) 10 MG tablet; Take 1 tablet (10 mg total) by mouth daily.  Dispense: 30 tablet; Refill: 2  6. Essential hypertension - amLODipine (NORVASC) 10 MG tablet; Take 1 tablet (10 mg total) by mouth daily.  Dispense: 60 tablet; Refill: 0 - cloNIDine (CATAPRES) 0.3 MG tablet; TAKE ONE TABLET BY MOUTH 3 TIMES A DAY  Dispense: 90 tablet; Refill: 2 - furosemide (LASIX) 40 MG tablet; Take 1 tablet  (40 mg total) by mouth daily.  Dispense: 30 tablet; Refill: 3  7. Essential hypertension - amLODipine (NORVASC) 10 MG tablet; Take 1 tablet (10 mg total) by mouth daily.  Dispense: 60 tablet; Refill: 0 - cloNIDine (CATAPRES) 0.3 MG tablet; TAKE ONE TABLET BY MOUTH 3 TIMES A DAY  Dispense: 90 tablet; Refill: 2 - furosemide (LASIX) 40 MG tablet; Take 1 tablet (40 mg total) by mouth daily.  Dispense: 30 tablet; Refill: 3  8. Gastroesophageal reflux disease without esophagitis - omeprazole (PRILOSEC) 20 MG capsule; Take 1 capsule (20 mg total) by mouth daily.  Dispense: 90 capsule; Refill: 0  9. History of seasonal allergies - Fluticasone-Salmeterol (ADVAIR) 100-50 MCG/DOSE AEPB; Inhale 1 puff into the lungs 2 (two) times daily.  Dispense: 1 each; Refill: 3  10. Hypokalemia - potassium chloride SA (KLOR-CON) 20 MEQ tablet; Take 1 tablet (20 mEq total) by mouth daily.  Dispense: 60 tablet; Refill: 0   Lyzette Reinhardt, PA-C   [facility]

## 2019-02-20 ENCOUNTER — Institutional Professional Consult (permissible substitution): Payer: Medicaid Other | Admitting: Licensed Clinical Social Worker

## 2019-03-01 ENCOUNTER — Other Ambulatory Visit: Payer: Self-pay | Admitting: Urology

## 2019-03-06 ENCOUNTER — Institutional Professional Consult (permissible substitution): Payer: Medicaid Other | Admitting: Licensed Clinical Social Worker

## 2019-03-06 ENCOUNTER — Telehealth: Payer: Self-pay | Admitting: Licensed Clinical Social Worker

## 2019-03-06 NOTE — Telephone Encounter (Signed)
Patient chose to cancel appointment due to non compliance with proper mask guidelines. Verbal instructions were given to patient at time of check in of her scheduled appointment for how to appropriately wear a mask per CDC guidelines but did not want to follow due to compliant of mask fogging up glasses.

## 2019-03-08 ENCOUNTER — Ambulatory Visit: Payer: Medicaid Other | Admitting: Urology

## 2019-03-08 VITALS — BP 131/75 | HR 56 | Temp 98.0°F | Ht 61.0 in | Wt 171.0 lb

## 2019-03-08 DIAGNOSIS — L02419 Cutaneous abscess of limb, unspecified: Secondary | ICD-10-CM

## 2019-03-08 DIAGNOSIS — M722 Plantar fascial fibromatosis: Secondary | ICD-10-CM

## 2019-03-08 MED ORDER — SULFAMETHOXAZOLE-TRIMETHOPRIM 800-160 MG PO TABS: 1.0000 | ORAL_TABLET | Freq: Two times a day (BID) | ORAL | 0 refills | Status: DC

## 2019-03-08 NOTE — Patient Instructions (Signed)
Plantar Fasciitis  Plantar fasciitis is a painful foot condition that affects the heel. It occurs when the band of tissue that connects the toes to the heel bone (plantar fascia) becomes irritated. This can happen as the result of exercising too much or doing other repetitive activities (overuse injury). The pain from plantar fasciitis can range from mild irritation to severe pain that makes it difficult to walk or move. The pain is usually worse in the morning after sleeping, or after sitting or lying down for a while. Pain may also be worse after long periods of walking or standing. What are the causes? This condition may be caused by:  Standing for long periods of time.  Wearing shoes that do not have good arch support.  Doing activities that put stress on joints (high-impact activities), including running, aerobics, and ballet.  Being overweight.  An abnormal way of walking (gait).  Tight muscles in the back of your lower leg (calf).  High arches in your feet.  Starting a new athletic activity. What are the signs or symptoms? The main symptom of this condition is heel pain. Pain may:  Be worse with first steps after a time of rest, especially in the morning after sleeping or after you have been sitting or lying down for a while.  Be worse after long periods of standing still.  Decrease after 30-45 minutes of activity, such as gentle walking. How is this diagnosed? This condition may be diagnosed based on your medical history and your symptoms. Your health care provider may ask questions about your activity level. Your health care provider will do a physical exam to check for:  A tender area on the bottom of your foot.  A high arch in your foot.  Pain when you move your foot.  Difficulty moving your foot. You may have imaging tests to confirm the diagnosis, such as:  X-rays.  Ultrasound.  MRI. How is this treated? Treatment for plantar fasciitis depends on how  severe your condition is. Treatment may include:  Rest, ice, applying pressure (compression), and raising the affected foot (elevation). This may be called RICE therapy. Your health care provider may recommend RICE therapy along with over-the-counter pain medicines to manage your pain.  Exercises to stretch your calves and your plantar fascia.  A splint that holds your foot in a stretched, upward position while you sleep (night splint).  Physical therapy to relieve symptoms and prevent problems in the future.  Injections of steroid medicine (cortisone) to relieve pain and inflammation.  Stimulating your plantar fascia with electrical impulses (extracorporeal shock wave therapy). This is usually the last treatment option before surgery.  Surgery, if other treatments have not worked after 12 months. Follow these instructions at home:  Managing pain, stiffness, and swelling  If directed, put ice on the painful area: ? Put ice in a plastic bag, or use a frozen bottle of water. ? Place a towel between your skin and the bag or bottle. ? Roll the bottom of your foot over the bag or bottle. ? Do this for 20 minutes, 2-3 times a day.  Wear athletic shoes that have air-sole or gel-sole cushions, or try wearing soft shoe inserts that are designed for plantar fasciitis.  Raise (elevate) your foot above the level of your heart while you are sitting or lying down. Activity  Avoid activities that cause pain. Ask your health care provider what activities are safe for you.  Do physical therapy exercises and stretches as told   by your health care provider.  Try activities and forms of exercise that are easier on your joints (low-impact). Examples include swimming, water aerobics, and biking. General instructions  Take over-the-counter and prescription medicines only as told by your health care provider.  Wear a night splint while sleeping, if told by your health care provider. Loosen the splint  if your toes tingle, become numb, or turn cold and blue.  Maintain a healthy weight, or work with your health care provider to lose weight as needed.  Keep all follow-up visits as told by your health care provider. This is important. Contact a health care provider if you:  Have symptoms that do not go away after caring for yourself at home.  Have pain that gets worse.  Have pain that affects your ability to move or do your daily activities. Summary  Plantar fasciitis is a painful foot condition that affects the heel. It occurs when the band of tissue that connects the toes to the heel bone (plantar fascia) becomes irritated.  The main symptom of this condition is heel pain that may be worse after exercising too much or standing still for a long time.  Treatment varies, but it usually starts with rest, ice, compression, and elevation (RICE therapy) and over-the-counter medicines to manage pain. This information is not intended to replace advice given to you by your health care provider. Make sure you discuss any questions you have with your health care provider. Document Revised: 12/03/2016 Document Reviewed: 10/18/2016 Elsevier Patient Education  2020 Else[employer] Inc[employer]  Plantar Fasciitis Rehab Ask your health care provider which exercises are safe for you. Do exercises exactly as told by your health care provider and adjust them as directed. It is normal to feel mild stretching, pulling, tightness, or discomfort as you do these exercises. Stop right away if you feel sudden pain or your pain gets worse. Do not begin these exercises until told by your health care provider. Stretching and range-of-motion exercises These exercises warm up your muscles and joints and improve the movement and flexibility of your foot. These exercises also help to relieve pain. Plantar fascia stretch  1. Sit with your left / right leg crossed over your opposite knee. 2. Hold your heel with one hand with that  thumb near your arch. With your other hand, hold your toes and gently pull them back toward the top of your foot. You should feel a stretch on the bottom of your toes or your foot (plantar fascia) or both. 3. Hold this stretch for__________ seconds. 4. Slowly release your toes and return to the starting position. Repeat __________ times. Complete this exercise __________ times a day. Gastrocnemius stretch, standing This exercise is also called a calf (gastroc) stretch. It stretches the muscles in the back of the upper calf. 1. Stand with your hands against a wall. 2. Extend your left / right leg behind you, and bend your front knee slightly. 3. Keeping your heels on the floor and your back knee straight, shift your weight toward the wall. Do not arch your back. You should feel a gentle stretch in your upper left / right calf. 4. Hold this position for __________ seconds. Repeat __________ times. Complete this exercise __________ times a day. Soleus stretch, standing This exercise is also called a calf (soleus) stretch. It stretches the muscles in the back of the lower calf. 1. Stand with your hands against a wall. 2. Extend your left / right leg behind you, and bend your front   knee slightly. 3. Keeping your heels on the floor, bend your back knee and shift your weight slightly over your back leg. You should feel a gentle stretch deep in your lower calf. 4. Hold this position for __________ seconds. Repeat __________ times. Complete this exercise __________ times a day. Gastroc and soleus stretch, standing step This exercise stretches the muscles in the back of the lower leg. These muscles are in the upper calf (gastrocnemius) and the lower calf (soleus). 1. Stand with the ball of your left / right foot on a step. The ball of your foot is on the walking surface, right under your toes. 2. Keep your other foot firmly on the same step. 3. Hold on to the wall or a railing for balance. 4. Slowly  lift your other foot, allowing your body weight to press your left / right heel down over the edge of the step. You should feel a stretch in your left / right calf. 5. Hold this position for __________ seconds. 6. Return both feet to the step. 7. Repeat this exercise with a slight bend in your left / right knee. Repeat __________ times with your left / right knee straight and __________ times with your left / right knee bent. Complete this exercise __________ times a day. Balance exercise This exercise builds your balance and strength control of your arch to help take pressure off your plantar fascia. Single leg stand If this exercise is too easy, you can try it with your eyes closed or while standing on a pillow. 1. Without shoes, stand near a railing or in a doorway. You may hold on to the railing or door frame as needed. 2. Stand on your left / right foot. Keep your big toe down on the floor and try to keep your arch lifted. Do not let your foot roll inward. 3. Hold this position for __________ seconds. Repeat __________ times. Complete this exercise __________ times a day. This information is not intended to replace advice given to you by your health care provider. Make sure you discuss any questions you have with your health care provider. Document Revised: 04/13/2018 Document Reviewed: 10/19/2017 Elsevier Patient Education  2020 Elsevier Inc.  

## 2019-03-08 NOTE — Progress Notes (Signed)
Patient: Gabrielle Schultz Female    DOB: 04-Aug-1970   49 y.o.   MRN: [id number] Visit Date: 03/08/2019  Today's Provider: Zara Council, PA-C   Chief Complaint  Patient presents with  . Cyst    A week of pain, up and down in pain, busted, draining, on proximal anterior medial thigh   Subjective:    HPI  She states the wound started as a boil and it burst and began leaking.  She sought care at [facility] clinic and was given Levaquin, but she developed thrush.  She was instructed to start another antibiotic and she would like [place] to prescribe that medication.  She has had similar instances in the past.  Patient also states that the bottom of her feet feel better when she is walking in flip-flops versus her tennis shoes.  Allergies  Allergen Reactions  . Coconut Oil Hives and Swelling  . Amoxicillin Other (See Comments)    "makes me sick" Has patient had a PCN reaction causing immediate rash, facial/tongue/throat swelling, SOB or lightheadedness with hypotension: No Has patient had a PCN reaction causing severe rash involving mucus membranes or skin necrosis: No Has patient had a PCN reaction that required hospitalization: No Has patient had a PCN reaction occurring within the last 10 years: No If all of the above answers are "NO", then may proceed with Cephalosporin use.    Marland Kitchen Penicillins Other (See Comments)    "makes me sick" Has patient had a PCN reaction causing immediate rash, facial/tongue/throat swelling, SOB or lightheadedness with hypotension: No Has patient had a PCN reaction causing severe rash involving mucus membranes or skin necrosis: No Has patient had a PCN reaction that required hospitalization: No Has patient had a PCN reaction occurring within the last 10 years: Yes If all of the above answers are "NO", then may proceed with Cephalosporin use.   Marland Kitchen Singulair [Montelukast Sodium] Rash   Previous Medications   ALBUTEROL (VENTOLIN HFA) 108 (90 BASE) MCG/ACT  INHALER    INHALE 2 PUFFS EVERY 4 HOURS AS NEEDED FOR COUGH AND WHEEZING.   ALLOPURINOL (ZYLOPRIM) 100 MG TABLET    Take 1 tablet (100 mg total) by mouth daily.   AMLODIPINE (NORVASC) 10 MG TABLET    Take 1 tablet (10 mg total) by mouth daily.   ASPIRIN 81 MG CHEWABLE TABLET    Chew 81 mg by mouth daily.   CALCITRIOL (ROCALTROL) 0.25 MCG CAPSULE    Take 0.25 mcg by mouth daily.   CETIRIZINE (ZYRTEC) 10 MG TABLET    Take 1 tablet (10 mg total) by mouth daily.   CLONIDINE (CATAPRES) 0.3 MG TABLET    TAKE ONE TABLET BY MOUTH 3 TIMES A DAY   FLUTICASONE (FLONASE) 50 MCG/ACT NASAL SPRAY    PLACE 1 SPRAY INTO BOTH NOSTRILS 2 TIMES A DAY   FLUTICASONE-SALMETEROL (ADVAIR) 100-50 MCG/DOSE AEPB    Inhale 1 puff into the lungs 2 (two) times daily.   FUROSEMIDE (LASIX) 40 MG TABLET    Take 1 tablet (40 mg total) by mouth daily.   IPRATROPIUM (ATROVENT) 0.03 % NASAL SPRAY    Place 2 sprays into both nostrils every 12 (twelve) hours.   OMEPRAZOLE (PRILOSEC) 20 MG CAPSULE    Take 1 capsule (20 mg total) by mouth daily.   POTASSIUM CHLORIDE SA (KLOR-CON) 20 MEQ TABLET    Take 1 tablet (20 mEq total) by mouth daily.   PROMETHAZINE (PHENERGAN) 25 MG TABLET    Take 1 tablet (  25 mg total) by mouth every 6 (six) hours as needed for nausea or vomiting.   RANITIDINE (ZANTAC) 150 MG TABLET    Take 1 tablet (150 mg total) by mouth 2 (two) times daily.    Review of Systems  Social History   Tobacco Use  . Smoking status: Current Every Day Smoker    Packs/day: 0.33    Years: 33.00    Pack years: 10.89    Types: Cigarettes    Start date: 03/04/2017  . Smokeless tobacco: Never Used  . Tobacco comment: pt quit in 07/2016 but restarted   Substance Use Topics  . Alcohol use: Not Currently    Comment: "seldom" - drinks 350-772mL when she does drink   Objective:   BP 131/75   Pulse (!) 56   Temp 98 F (36.7 C)   Ht 5\' 1"  (1.549 m)   Wt 171 lb (77.6 kg)   SpO2 90%   BMI 32.31 kg/m   Physical Exam   No  fluctuance or crepitus is felt when wound is palpated.  Also no pus is expressed when wound is pressed.     Assessment & Plan:    1.  Cellulitis  -Wound has spontaneously drained  -Will prescribe Septra DS, twice daily x14 days  -Will return in 1 week for wound recheck  2.  Plantar fasciitis  -Given at home exercises  -Will recheck once returns in 1 week   Zara Council, PA-C   [facility]

## 2019-03-09 ENCOUNTER — Telehealth: Payer: Self-pay | Admitting: Pharmacist

## 2019-03-09 NOTE — Telephone Encounter (Signed)
03/09/2019 1:41:21 PM - Ventolin & Advair scripts to [place] for refills  -- Elmer Picker - Friday, March 09, 2019 1:40 PM -- Faxed scripts to [facility] for refills on Ventolin HFA 82mcg Inhale 2 puffs every 4 hours as needed for cough & wheezing & Advair 100/36mcg Inhale 1 puff into the lungs two times a day.

## 2019-03-13 ENCOUNTER — Other Ambulatory Visit: Payer: Self-pay | Admitting: Gerontology

## 2019-03-13 DIAGNOSIS — R899 Unspecified abnormal finding in specimens from other organs, systems and tissues: Secondary | ICD-10-CM

## 2019-03-14 ENCOUNTER — Other Ambulatory Visit: Payer: Medicaid Other

## 2019-03-15 ENCOUNTER — Other Ambulatory Visit: Payer: Self-pay

## 2019-03-15 ENCOUNTER — Ambulatory Visit: Payer: Medicaid Other | Admitting: Urology

## 2019-03-15 ENCOUNTER — Telehealth: Payer: Self-pay | Admitting: Urology

## 2019-03-15 VITALS — BP 139/76 | HR 5 | Temp 97.8°F | Ht 61.0 in | Wt 171.0 lb

## 2019-03-15 DIAGNOSIS — M722 Plantar fascial fibromatosis: Secondary | ICD-10-CM

## 2019-03-15 DIAGNOSIS — L02419 Cutaneous abscess of limb, unspecified: Secondary | ICD-10-CM

## 2019-03-15 DIAGNOSIS — L03119 Cellulitis of unspecified part of limb: Secondary | ICD-10-CM

## 2019-03-15 NOTE — Telephone Encounter (Signed)
Needs to be scheduled for Dr. Vickki Hearing - no appointments available thru May

## 2019-03-15 NOTE — Progress Notes (Signed)
Patient: Gabrielle Schultz Female    DOB: 1970/12/27   49 y.o.   MRN: [id number] Visit Date: 03/15/2019  Today's Provider: Zara Council, PA-C   Chief Complaint  Patient presents with  . Follow-up   Subjective:    HPI  Cellulitis  Wound is well healed  Plantar fasciitis  Exercises helped somewhat  Left heel is so painful in the am she can barely walk on it, but it will get better as the day progresses      Allergies  Allergen Reactions  . Coconut Oil Hives and Swelling  . Amoxicillin Other (See Comments)    "makes me sick" Has patient had a PCN reaction causing immediate rash, facial/tongue/throat swelling, SOB or lightheadedness with hypotension: No Has patient had a PCN reaction causing severe rash involving mucus membranes or skin necrosis: No Has patient had a PCN reaction that required hospitalization: No Has patient had a PCN reaction occurring within the last 10 years: No If all of the above answers are "NO", then may proceed with Cephalosporin use.    Marland Kitchen Penicillins Other (See Comments)    "makes me sick" Has patient had a PCN reaction causing immediate rash, facial/tongue/throat swelling, SOB or lightheadedness with hypotension: No Has patient had a PCN reaction causing severe rash involving mucus membranes or skin necrosis: No Has patient had a PCN reaction that required hospitalization: No Has patient had a PCN reaction occurring within the last 10 years: Yes If all of the above answers are "NO", then may proceed with Cephalosporin use.   Marland Kitchen Singulair [Montelukast Sodium] Rash   Previous Medications   ALBUTEROL (VENTOLIN HFA) 108 (90 BASE) MCG/ACT INHALER    INHALE 2 PUFFS EVERY 4 HOURS AS NEEDED FOR COUGH AND WHEEZING.   ALLOPURINOL (ZYLOPRIM) 100 MG TABLET    Take 1 tablet (100 mg total) by mouth daily.   AMLODIPINE (NORVASC) 10 MG TABLET    Take 1 tablet (10 mg total) by mouth daily.   ASPIRIN 81 MG CHEWABLE TABLET    Chew 81 mg by mouth daily.   CALCITRIOL  (ROCALTROL) 0.25 MCG CAPSULE    Take 0.25 mcg by mouth daily.   CETIRIZINE (ZYRTEC) 10 MG TABLET    Take 1 tablet (10 mg total) by mouth daily.   CLONIDINE (CATAPRES) 0.3 MG TABLET    TAKE ONE TABLET BY MOUTH 3 TIMES A DAY   FLUTICASONE (FLONASE) 50 MCG/ACT NASAL SPRAY    PLACE 1 SPRAY INTO BOTH NOSTRILS 2 TIMES A DAY   FLUTICASONE-SALMETEROL (ADVAIR) 100-50 MCG/DOSE AEPB    Inhale 1 puff into the lungs 2 (two) times daily.   FUROSEMIDE (LASIX) 40 MG TABLET    Take 1 tablet (40 mg total) by mouth daily.   IPRATROPIUM (ATROVENT) 0.03 % NASAL SPRAY    Place 2 sprays into both nostrils every 12 (twelve) hours.   OMEPRAZOLE (PRILOSEC) 20 MG CAPSULE    Take 1 capsule (20 mg total) by mouth daily.   POTASSIUM CHLORIDE SA (KLOR-CON) 20 MEQ TABLET    Take 1 tablet (20 mEq total) by mouth daily.   PROMETHAZINE (PHENERGAN) 25 MG TABLET    Take 1 tablet (25 mg total) by mouth every 6 (six) hours as needed for nausea or vomiting.   RANITIDINE (ZANTAC) 150 MG TABLET    Take 1 tablet (150 mg total) by mouth 2 (two) times daily.   SULFAMETHOXAZOLE-TRIMETHOPRIM (BACTRIM DS) 800-160 MG TABLET    Take 1 tablet by mouth every 12 (twelve)  hours.    Review of Systems  Social History   Tobacco Use  . Smoking status: Current Every Day Smoker    Packs/day: 0.33    Years: 33.00    Pack years: 10.89    Types: Cigarettes    Start date: 03/04/2017  . Smokeless tobacco: Never Used  . Tobacco comment: pt quit in 07/2016 but restarted   Substance Use Topics  . Alcohol use: Not Currently    Comment: "seldom" - drinks 350-731mL when she does drink   Objective:   BP 139/76   Pulse (!) 5   Temp 97.8 F (36.6 C)   Ht 5\' 1"  (1.549 m)   Wt 171 lb (77.6 kg)   LMP 01/15/2019 (Within Weeks)   SpO2 100%   BMI 32.31 kg/m   Physical Exam        Assessment & Plan:    1. Cellulitis and abscess of leg Healing well  2. Plantar fasciitis Will have her see Dr. Vickki Hearing for further evaluation    Zara Council, PA-C   [facility]

## 2019-04-05 ENCOUNTER — Other Ambulatory Visit: Payer: Self-pay

## 2019-04-05 ENCOUNTER — Other Ambulatory Visit: Payer: Medicaid Other

## 2019-04-05 DIAGNOSIS — R899 Unspecified abnormal finding in specimens from other organs, systems and tissues: Secondary | ICD-10-CM

## 2019-04-06 LAB — BASIC METABOLIC PANEL
BUN/Creatinine Ratio: 12 (ref 9–23)
BUN: 23 mg/dL (ref 6–24)
CO2: 21 mmol/L (ref 20–29)
Calcium: 9.2 mg/dL (ref 8.7–10.2)
Chloride: 103 mmol/L (ref 96–106)
Creatinine, Ser: 1.89 mg/dL — ABNORMAL HIGH (ref 0.57–1.00)
GFR calc Af Amer: 36 mL/min/{1.73_m2} — ABNORMAL LOW (ref >59)
GFR calc non Af Amer: 31 mL/min/{1.73_m2} — ABNORMAL LOW (ref >59)
Glucose: 114 mg/dL — ABNORMAL HIGH (ref 65–99)
Potassium: 4 mmol/L (ref 3.5–5.2)
Sodium: 141 mmol/L (ref 134–144)

## 2019-04-06 LAB — LIPID PANEL
Chol/HDL Ratio: 7.9 ratio — ABNORMAL HIGH (ref 0.0–4.4)
Cholesterol, Total: 221 mg/dL — ABNORMAL HIGH (ref 100–199)
HDL: 28 mg/dL — ABNORMAL LOW (ref >39)
LDL Chol Calc (NIH): 136 mg/dL — ABNORMAL HIGH (ref 0–99)
Triglycerides: 311 mg/dL — ABNORMAL HIGH (ref 0–149)
VLDL Cholesterol Cal: 57 mg/dL — ABNORMAL HIGH (ref 5–40)

## 2019-04-06 LAB — HEMOGLOBIN A1C
Est. average glucose Bld gHb Est-mCnc: 111 mg/dL
Hgb A1c MFr Bld: 5.5 % (ref 4.8–5.6)

## 2019-04-12 ENCOUNTER — Other Ambulatory Visit: Payer: Self-pay

## 2019-04-12 ENCOUNTER — Ambulatory Visit: Payer: Medicaid Other | Admitting: Family Medicine

## 2019-04-12 VITALS — Ht 61.0 in

## 2019-04-12 DIAGNOSIS — I10 Essential (primary) hypertension: Secondary | ICD-10-CM

## 2019-04-12 DIAGNOSIS — K219 Gastro-esophageal reflux disease without esophagitis: Secondary | ICD-10-CM

## 2019-04-12 DIAGNOSIS — J449 Chronic obstructive pulmonary disease, unspecified: Secondary | ICD-10-CM

## 2019-04-12 MED ORDER — CLONIDINE HCL 0.3 MG PO TABS: ORAL_TABLET | ORAL | 2 refills | Status: DC

## 2019-04-12 MED ORDER — OMEPRAZOLE 20 MG PO CPDR: 20.0000 mg | DELAYED_RELEASE_CAPSULE | Freq: Every day | ORAL | 0 refills | Status: DC

## 2019-04-12 NOTE — Patient Instructions (Signed)
Coping with Quitting Smoking  Quitting smoking is a physical and mental challenge. You will face cravings, withdrawal symptoms, and temptation. Before quitting, work with your health care provider to make a plan that can help you cope. Preparation can help you quit and keep you from giving in. How can I cope with cravings? Cravings usually last for 5-10 minutes. If you get through it, the craving will pass. Consider taking the following actions to help you cope with cravings:  Keep your mouth busy: ? Chew sugar-free gum. ? Suck on hard candies or a straw. ? Brush your teeth.  Keep your hands and body busy: ? Immediately change to a different activity when you feel a craving. ? Squeeze or play with a ball. ? Do an activity or a hobby, like making bead jewelry, practicing needlepoint, or working with wood. ? Mix up your normal routine. ? Take a short exercise break. Go for a quick walk or run up and down stairs. ? Spend time in public places where smoking is not allowed.  Focus on doing something kind or helpful for someone else.  Call a friend or family member to talk during a craving.  Join a support group.  Call a quit line, such as 1-800-QUIT-NOW.  Talk with your health care provider about medicines that might help you cope with cravings and make quitting easier for you. How can I deal with withdrawal symptoms? Your body may experience negative effects as it tries to get used to not having nicotine in the system. These effects are called withdrawal symptoms. They may include:  Feeling hungrier than normal.  Trouble concentrating.  Irritability.  Trouble sleeping.  Feeling depressed.  Restlessness and agitation.  Craving a cigarette. To manage withdrawal symptoms:  Avoid places, people, and activities that trigger your cravings.  Remember why you want to quit.  Get plenty of sleep.  Avoid coffee and other caffeinated drinks. These may worsen some of your  symptoms. How can I handle social situations? Social situations can be difficult when you are quitting smoking, especially in the first few weeks. To manage this, you can:  Avoid parties, bars, and other social situations where people might be smoking.  Avoid alcohol.  Leave right away if you have the urge to smoke.  Explain to your family and friends that you are quitting smoking. Ask for understanding and support.  Plan activities with friends or family where smoking is not an option. What are some ways I can cope with stress? Wanting to smoke may cause stress, and stress can make you want to smoke. Find ways to manage your stress. Relaxation techniques can help. For example:  Breathe slowly and deeply, in through your nose and out through your mouth.  Listen to soothing, relaxing music.  Talk with a family member or friend about your stress.  Light a candle.  Soak in a bath or take a shower.  Think about a peaceful place. What are some ways I can prevent weight gain? Be aware that many people gain weight after they quit smoking. However, not everyone does. To keep from gaining weight, have a plan in place before you quit and stick to the plan after you quit. Your plan should include:  Having healthy snacks. When you have a craving, it may help to: ? Eat plain popcorn, crunchy carrots, celery, or other cut vegetables. ? Chew sugar-free gum.  Changing how you eat: ? Eat small portion sizes at meals. ? Eat 4-6 small meals   throughout the day instead of 1-2 large meals a day. ? Be mindful when you eat. Do not watch television or do other things that might distract you as you eat.  Exercising regularly: ? Make time to exercise each day. If you do not have time for a long workout, do short bouts of exercise for 5-10 minutes several times a day. ? Do some form of strengthening exercise, like weight lifting, and some form of aerobic exercise, like running or swimming.  Drinking  plenty of water or other low-calorie or no-calorie drinks. Drink 6-8 glasses of water daily, or as much as instructed by your health care provider. Summary  Quitting smoking is a physical and mental challenge. You will face cravings, withdrawal symptoms, and temptation to smoke again. Preparation can help you as you go through these challenges.  You can cope with cravings by keeping your mouth busy (such as by chewing gum), keeping your body and hands busy, and making calls to family, friends, or a helpline for people who want to quit smoking.  You can cope with withdrawal symptoms by avoiding places where people smoke, avoiding drinks with caffeine, and getting plenty of rest.  Ask your health care provider about the different ways to prevent weight gain, avoid stress, and handle social situations. This information is not intended to replace advice given to you by your health care provider. Make sure you discuss any questions you have with your health care provider. Document Revised: 12/03/2016 Document Reviewed: 12/19/2015 Elsevier Patient Education  2020 [employer] Inc.  

## 2019-04-12 NOTE — Progress Notes (Signed)
   Virtual Visit via Telephone Note  I connected with Gabrielle Schultz on 04/12/19 at  5:30 PM EDT by telephone and verified that I am speaking with the correct person using two identifiers.   I discussed the limitations, risks, security and privacy concerns of performing an evaluation and management service by telephone and the availability of in person appointments. I also discussed with the patient that there may be a patient responsible charge related to this service. The patient expressed understanding and agreed to proceed.   History of Present Illness: Gabrielle Schultz  has a past medical history of Acid reflux, Allergic rhinitis (06/26/2015), Allergy, Asthma, Carpal tunnel syndrome, Chronic kidney disease, Colitis, CVA (cerebral infarction), History of syncope, Hypertension (2008), Low HDL (under 40) (06/26/2015), MI (myocardial infarction) ([place]), Migraines, Poor circulation, Renal insufficiency, Stroke ([place]), and Syncope and collapse.  Patient states that she needs refills on her clonidine and omeprazole. She states that she does not monitor her BP at home due to not having time to do so. She states that she is under a lot of stress due to her mother not taking care of her hygiene. She is smoking  About 1/2 ppd due to stress. She states that she is having to use her inhalers more and would like to have further evaluation of her COPD.    Observations/Objective: Patient with regular voice tone, rate and rhythm. Speaking calmly and is in no apparent distress.    Assessment and Plan: 1. Essential hypertension Refilled medications. Encouraged the use of home BP cuff.  - cloNIDine (CATAPRES) 0.3 MG tablet; TAKE ONE TABLET BY MOUTH 3 TIMES A DAY  Dispense: 90 tablet; Refill: 2  2. Gastroesophageal reflux disease without esophagitis - omeprazole (PRILOSEC) 20 MG capsule; Take 1 capsule (20 mg total) by mouth daily.  Dispense: 90 capsule; Refill: 0  3. Chronic obstructive pulmonary disease,  unspecified COPD type ([place]) Smoking cessation instruction/counseling given:  counseled patient on the dangers of tobacco use, advised patient to stop smoking, and reviewed strategies to maximize success  - Ambulatory referral to Pulmonology    Reviewed lab work with patient. She does not want to start a statin medication at this time. She will work on dietary changes. She has an appt with nephrology next month.   Follow Up Instructions:  We discussed hand washing, using hand sanitizer when soap and water are not available, only going out when absolutely necessary, and social distancing. Explained to patient that she is immunocompromised and will need to take precautions during this time.   I discussed the assessment and treatment plan with the patient. The patient was provided an opportunity to ask questions and all were answered. The patient agreed with the plan and demonstrated an understanding of the instructions.   The patient was advised to call back or seek an in-person evaluation if the symptoms worsen or if the condition fails to improve as anticipated.  I provided 15 minutes of non-face-to-face time during this encounter.  Ms. Andr L. Nathaneil Canary, FNP-BC [facility] [address] [place], [place] [address] [phone]

## 2019-05-09 ENCOUNTER — Ambulatory Visit: Payer: Medicaid Other | Admitting: Neurology

## 2019-05-09 ENCOUNTER — Telehealth: Payer: Self-pay | Admitting: Pharmacy Technician

## 2019-05-09 ENCOUNTER — Other Ambulatory Visit: Payer: Self-pay

## 2019-05-09 NOTE — Telephone Encounter (Signed)
Received updated proof of income.  Patient eligible to receive medication assistance at [facility] until time for re-certification in 9359, and as long as eligibility requirements continue to be met.  [facility]

## 2019-05-10 ENCOUNTER — Other Ambulatory Visit: Payer: Self-pay | Admitting: Gerontology

## 2019-05-10 ENCOUNTER — Telehealth: Payer: Self-pay | Admitting: Pharmacist

## 2019-05-10 ENCOUNTER — Other Ambulatory Visit: Payer: Medicaid Other

## 2019-05-10 VITALS — BP 148/78 | HR 66 | Temp 96.4°F | Ht 61.0 in | Wt 170.6 lb

## 2019-05-10 DIAGNOSIS — I10 Essential (primary) hypertension: Secondary | ICD-10-CM

## 2019-05-10 DIAGNOSIS — J3089 Other allergic rhinitis: Secondary | ICD-10-CM

## 2019-05-10 NOTE — Progress Notes (Unsigned)
Virtual Visit via Video Note The purpose of this virtual visit is to provide medical care while limiting exposure to the novel coronavirus.    Consent was obtained for video visit:  Yes.   Answered questions that patient had about telehealth interaction:  Yes.   I discussed the limitations, risks, security and privacy concerns of performing an evaluation and management service by telemedicine. I also discussed with the patient that there may be a patient responsible charge related to this service. The patient expressed understanding and agreed to proceed.  Pt location: Home Physician Location: office Name of referring provider:  Langston Reusing, NP I connected with [name] at patients initiation/request on 05/11/2019 at  1:30 PM EDT by video enabled telemedicine application and verified that I am speaking with the correct person using two identifiers. Pt MRN:  [id number] Pt DOB:  [birth year] Video Participants:  [name]   History of Present Illness:  Gabrielle Schultz is a 49 year old right-handed white female with CKD, hypertension and history of CVA who follows up for right hand cramp.  UPDATE: NCV-EMG of right upper extremity was normal.  Recommended occupational therapy for hand cramping.  ***  04/05/2019 LABS:  Lipid panel with LDL 136; Hgb A1c 5.5.  HISTORY: She had a stroke at age 39.  She doesn't remember her symptoms.    She had a second stroke in addition to an acute ischemic colitis in 2014.  She does not remember her symptoms but records report right homonymous hemianopsia and right arm and leg weakness and numbness of 2 months duration.   MRI of brain without contrast from 12/29/2012 demonstrated "[R]emote moderate size left occipital/posterior inferior left temporal lobe infarct with small area of involvement of the inferior left parietal lobe.  Associated encephalomalacia and laminar necrosis.  No evidence of acute infarct.  Remote small left frontal lobe  infarct.  Mild to moderate nonspecific white matter type changes.Marland KitchenMarland KitchenGlobal atrophy without hydrocephalus."  She reports that no cause for her strokes were ever found.  Following her stroke, she never underwent PT/OT because she had no insurance.  Since the second stroke, she continues to have aching and cramping of her hand down to her elbow with use of her hand.  She also has numbness involving the first 3 digits of her right hand.  She has history of carpal tunnel syndrome diagnosed many years ago, so she tried wearing a brace.  However, it has been ineffective.  When she walks, sometimes it feels like her leg will give out.  2D Echocardiogram from 08/30/2016 demonstrated LVEF 55-65% with severe AR and moderate MR and TR.  LABS:  Hgb A1c from 03/23/2018 was 5.5; LDL from 12/15/2017 was 126.  Past Medical History: Past Medical History:  Diagnosis Date  . Acid reflux   . Allergic rhinitis 06/26/2015  . Allergy   . Asthma   . Carpal tunnel syndrome    right hand  . Chronic kidney disease   . Colitis   . CVA (cerebral infarction)    2009, 2015  . History of syncope   . Hypertension 2008  . Low HDL (under 40) 06/26/2015  . MI (myocardial infarction) ([place])    2015 - patient wasnt told by cardiologist that she had MI but has had cardiac cath in past  . Migraines   . Poor circulation   . Renal insufficiency   . Stroke ([place])   . Syncope and collapse     Medications: Outpatient  Encounter Medications as of 05/11/2019  Medication Sig Note  . albuterol (VENTOLIN HFA) 108 (90 Base) MCG/ACT inhaler INHALE 2 PUFFS EVERY 4 HOURS AS NEEDED FOR COUGH AND WHEEZING. 04/12/2019: Not helping with breathing/air freshener situation  . allopurinol (ZYLOPRIM) 100 MG tablet Take 1 tablet (100 mg total) by mouth daily.   Marland Kitchen amLODipine (NORVASC) 10 MG tablet Take 1 tablet (10 mg total) by mouth daily.   Marland Kitchen aspirin 81 MG chewable tablet Chew 81 mg by mouth daily.   . calcitRIOL (ROCALTROL) 0.25 MCG capsule Take  0.25 mcg by mouth daily.   . cetirizine (ZYRTEC) 10 MG tablet Take 1 tablet (10 mg total) by mouth daily.   . cloNIDine (CATAPRES) 0.3 MG tablet TAKE ONE TABLET BY MOUTH 3 TIMES A DAY   . fluticasone (FLONASE) 50 MCG/ACT nasal spray PLACE 1 SPRAY INTO BOTH NOSTRILS 2 TIMES A DAY (Patient not taking: Reported on 04/12/2019)   . Fluticasone-Salmeterol (ADVAIR) 100-50 MCG/DOSE AEPB Inhale 1 puff into the lungs 2 (two) times daily.   . furosemide (LASIX) 40 MG tablet Take 1 tablet (40 mg total) by mouth daily.   Marland Kitchen ipratropium (ATROVENT) 0.03 % nasal spray Place 2 sprays into both nostrils every 12 (twelve) hours.   Marland Kitchen omeprazole (PRILOSEC) 20 MG capsule Take 1 capsule (20 mg total) by mouth daily.   . potassium chloride SA (KLOR-CON) 20 MEQ tablet Take 1 tablet (20 mEq total) by mouth daily.   . promethazine (PHENERGAN) 25 MG tablet Take 1 tablet (25 mg total) by mouth every 6 (six) hours as needed for nausea or vomiting.   . ranitidine (ZANTAC) 150 MG tablet Take 1 tablet (150 mg total) by mouth 2 (two) times daily. (Patient not taking: Reported on 04/12/2019) 10/05/2018: "when prilosec doesn't work"  . sulfamethoxazole-trimethoprim (BACTRIM DS) 800-160 MG tablet Take 1 tablet by mouth every 12 (twelve) hours. (Patient not taking: Reported on 04/12/2019)    No facility-administered encounter medications on file as of 05/11/2019.    Allergies: Allergies  Allergen Reactions  . Coconut Oil Hives and Swelling  . Amoxicillin Other (See Comments)    "makes me sick" Has patient had a PCN reaction causing immediate rash, facial/tongue/throat swelling, SOB or lightheadedness with hypotension: No Has patient had a PCN reaction causing severe rash involving mucus membranes or skin necrosis: No Has patient had a PCN reaction that required hospitalization: No Has patient had a PCN reaction occurring within the last 10 years: No If all of the above answers are "NO", then may proceed with Cephalosporin use.    Marland Kitchen  Penicillins Other (See Comments)    "makes me sick" Has patient had a PCN reaction causing immediate rash, facial/tongue/throat swelling, SOB or lightheadedness with hypotension: No Has patient had a PCN reaction causing severe rash involving mucus membranes or skin necrosis: No Has patient had a PCN reaction that required hospitalization: No Has patient had a PCN reaction occurring within the last 10 years: Yes If all of the above answers are "NO", then may proceed with Cephalosporin use.   Marland Kitchen Singulair [Montelukast Sodium] Rash    Family History: Family History  Problem Relation Age of Onset  . Hyperlipidemia Mother   . Heart disease Mother        has a defibrillator  . COPD Mother   . Hypertension Father   . Heart disease Father   . Hyperlipidemia Father   . Hypertension Sister        died in her 35s from  heart failure and end stage kidney disease  . Kidney disease Sister   . Hypertension Brother   . Heart disease Brother        has a defibrillator  . Coronary artery disease Brother        had CABG  . Healthy Child   . Diabetes Child     Social History: Social History   Socioeconomic History  . Marital status: Married    Spouse name: Not on file  . Number of children: 2  . Years of education: 11th grade  . Highest education level: 11th grade  Occupational History  . Not on file  Tobacco Use  . Smoking status: Current Every Day Smoker    Packs/day: 0.33    Years: 33.00    Pack years: 10.89    Types: Cigarettes    Start date: 03/04/2017  . Smokeless tobacco: Never Used  . Tobacco comment: pt quit in 07/2016 but restarted   Substance and Sexual Activity  . Alcohol use: Not Currently    Comment: "seldom" - drinks 350-733mL when she does drink  . Drug use: No  . Sexual activity: Yes    Partners: Male    Birth control/protection: None  Other Topics Concern  . Not on file  Social History Narrative   Husband is a Administrator for [employer] and is the sole  provider for the family including 2 children (62 yr old and 31 yr old)    Goes to food bank for food. Says "really worried" about water and light bill. Husband, baby, 35 year old, her, and mother all live together.    [facility].       Pt is right handed   Social Determinants of Health   Financial Resource Strain:   . Difficulty of Paying Living Expenses:   Food Insecurity:   . Worried About Charity fundraiser in the Last Year:   . Arboriculturist in the Last Year:   Transportation Needs: No Transportation Needs  . Lack of Transportation (Medical): No  . Lack of Transportation (Non-Medical): No  Physical Activity: Insufficiently Active  . Days of Exercise per Week: 2 days  . Minutes of Exercise per Session: 60 min  Stress:   . Feeling of Stress :   Social Connections:   . Frequency of Communication with Friends and Family:   . Frequency of Social Gatherings with Friends and Family:   . Attends Religious Services:   . Active Member of Clubs or Organizations:   . Attends Archivist Meetings:   Marland Kitchen Marital Status:   Intimate Partner Violence:   . Fear of Current or Ex-Partner:   . Emotionally Abused:   Marland Kitchen Physically Abused:   . Sexually Abused:     Observations/Objective:   *** No acute distress.  Alert and oriented.  Speech fluent and not dysarthric.  Language intact.  Eyes orthophoric on primary gaze.  Face symmetric.  Assessment and Plan:     Follow Up Instructions:    -I discussed the assessment and treatment plan with the patient. The patient was provided an opportunity to ask questions and all were answered. The patient agreed with the plan and demonstrated an understanding of the instructions.   The patient was advised to call back or seek an in-person evaluation if the symptoms worsen or if the condition fails to improve as anticipated.    Total Time spent in visit with the patient  was:  ***, of which more than 50% of the  time was spent in counseling and/or coordinating care on ***.   Pt understands and agrees with the plan of care outlined.     Dudley Major, DO

## 2019-05-10 NOTE — Telephone Encounter (Signed)
05/10/2019 11:14:03 AM - Scripts to dr for [facility] - Thursday, May 10, 2019 11:13 AM --Sending scripts to [facility] for [facility] renewal for Advair & Ventolin.

## 2019-05-10 NOTE — Progress Notes (Signed)
lipid

## 2019-05-11 ENCOUNTER — Telehealth: Payer: Medicaid Other | Admitting: Neurology

## 2019-05-11 ENCOUNTER — Other Ambulatory Visit: Payer: Self-pay

## 2019-05-11 LAB — COMPREHENSIVE METABOLIC PANEL
ALT: 9 IU/L (ref 0–32)
AST: 7 IU/L (ref 0–40)
Albumin/Globulin Ratio: 1.5 (ref 1.2–2.2)
Albumin: 3.9 g/dL (ref 3.8–4.8)
Alkaline Phosphatase: 160 IU/L — ABNORMAL HIGH (ref 39–117)
BUN/Creatinine Ratio: 12 (ref 9–23)
BUN: 21 mg/dL (ref 6–24)
Bilirubin Total: <0.2 mg/dL (ref 0.0–1.2)
CO2: 20 mmol/L (ref 20–29)
Calcium: 9.3 mg/dL (ref 8.7–10.2)
Chloride: 105 mmol/L (ref 96–106)
Creatinine, Ser: 1.72 mg/dL — ABNORMAL HIGH (ref 0.57–1.00)
GFR calc Af Amer: 41 mL/min/{1.73_m2} — ABNORMAL LOW (ref >59)
GFR calc non Af Amer: 35 mL/min/{1.73_m2} — ABNORMAL LOW (ref >59)
Globulin, Total: 2.6 g/dL (ref 1.5–4.5)
Glucose: 96 mg/dL (ref 65–99)
Potassium: 4.2 mmol/L (ref 3.5–5.2)
Sodium: 140 mmol/L (ref 134–144)
Total Protein: 6.5 g/dL (ref 6.0–8.5)

## 2019-05-11 LAB — URIC ACID: Uric Acid: 6 mg/dL (ref 2.6–6.2)

## 2019-05-11 LAB — LIPID PANEL
Chol/HDL Ratio: 8.8 ratio — ABNORMAL HIGH (ref 0.0–4.4)
Cholesterol, Total: 238 mg/dL — ABNORMAL HIGH (ref 100–199)
HDL: 27 mg/dL — ABNORMAL LOW (ref >39)
LDL Chol Calc (NIH): 158 mg/dL — ABNORMAL HIGH (ref 0–99)
Triglycerides: 281 mg/dL — ABNORMAL HIGH (ref 0–149)
VLDL Cholesterol Cal: 53 mg/dL — ABNORMAL HIGH (ref 5–40)

## 2019-05-17 ENCOUNTER — Ambulatory Visit: Payer: Medicaid Other

## 2019-05-31 ENCOUNTER — Ambulatory Visit: Payer: Medicaid Other

## 2019-06-06 ENCOUNTER — Other Ambulatory Visit: Payer: Self-pay | Admitting: Urology

## 2019-06-06 DIAGNOSIS — I10 Essential (primary) hypertension: Secondary | ICD-10-CM

## 2019-06-07 ENCOUNTER — Ambulatory Visit: Payer: Medicaid Other | Admitting: Urology

## 2019-06-07 ENCOUNTER — Other Ambulatory Visit: Payer: Self-pay

## 2019-06-07 VITALS — BP 142/71 | HR 52 | Temp 97.0°F | Ht 61.0 in | Wt 170.9 lb

## 2019-06-07 DIAGNOSIS — E782 Mixed hyperlipidemia: Secondary | ICD-10-CM

## 2019-06-07 DIAGNOSIS — I10 Essential (primary) hypertension: Secondary | ICD-10-CM

## 2019-06-07 DIAGNOSIS — N184 Chronic kidney disease, stage 4 (severe): Secondary | ICD-10-CM

## 2019-06-07 MED ORDER — AMLODIPINE BESYLATE 10 MG PO TABS: 10.0000 mg | ORAL_TABLET | Freq: Every day | ORAL | 0 refills | Status: DC

## 2019-06-07 MED ORDER — SIMVASTATIN 5 MG PO TABS: 5.0000 mg | ORAL_TABLET | Freq: Every day | ORAL | 0 refills | Status: DC

## 2019-06-07 NOTE — Progress Notes (Signed)
Patient: Gabrielle Schultz Female    DOB: 08-Jun-1970   49 y.o.   MRN: [id number] Visit Date: 06/07/2019  Today's Provider: [facility]   Chief Complaint  Patient presents with  . Follow-up    lab results   Subjective:    HPI Gabrielle Schultz is a 49 y.o. female with HTN, migraines, history of CVA, history of MI and CKD who presents today to review labs.   Lipids elevated, but stable from two months ago.    Uric acid WNL CMP serum creatinine 1.72, eGFR 35   No other complaints today.      Allergies  Allergen Reactions  . Coconut Oil Hives and Swelling    Just coconut ("all coconut")  . Amoxicillin Other (See Comments)    "makes me sick" Has patient had a PCN reaction causing immediate rash, facial/tongue/throat swelling, SOB or lightheadedness with hypotension: No Has patient had a PCN reaction causing severe rash involving mucus membranes or skin necrosis: No Has patient had a PCN reaction that required hospitalization: No Has patient had a PCN reaction occurring within the last 10 years: No If all of the above answers are "NO", then may proceed with Cephalosporin use.    Marland Kitchen Penicillins Other (See Comments)    "makes me sick" Has patient had a PCN reaction causing immediate rash, facial/tongue/throat swelling, SOB or lightheadedness with hypotension: No Has patient had a PCN reaction causing severe rash involving mucus membranes or skin necrosis: No Has patient had a PCN reaction that required hospitalization: No Has patient had a PCN reaction occurring within the last 10 years: Yes If all of the above answers are "NO", then may proceed with Cephalosporin use.   Marland Kitchen Singulair [Montelukast Sodium] Rash   Previous Medications   ALBUTEROL (VENTOLIN HFA) 108 (90 BASE) MCG/ACT INHALER    INHALE 2 PUFFS EVERY 4 HOURS AS NEEDED FOR COUGH AND WHEEZING.   ALLOPURINOL (ZYLOPRIM) 100 MG TABLET    Take 1 tablet (100 mg total) by mouth daily.   ASPIRIN 81 MG CHEWABLE TABLET     Chew 81 mg by mouth daily.   CALCITRIOL (ROCALTROL) 0.25 MCG CAPSULE    Take 0.25 mcg by mouth daily.   CETIRIZINE (ZYRTEC) 10 MG TABLET    TAKE ONE TABLET BY MOUTH EVERY DAY   CLONIDINE (CATAPRES) 0.3 MG TABLET    TAKE ONE TABLET BY MOUTH 3 TIMES A DAY   FLUTICASONE (FLONASE) 50 MCG/ACT NASAL SPRAY    PLACE 1 SPRAY INTO BOTH NOSTRILS 2 TIMES A DAY   FLUTICASONE-SALMETEROL (ADVAIR) 100-50 MCG/DOSE AEPB    Inhale 1 puff into the lungs 2 (two) times daily.   FUROSEMIDE (LASIX) 40 MG TABLET    TAKE ONE TABLET BY MOUTH EVERY DAY   IPRATROPIUM (ATROVENT) 0.03 % NASAL SPRAY    Place 2 sprays into both nostrils every 12 (twelve) hours.   OMEPRAZOLE (PRILOSEC) 20 MG CAPSULE    Take 1 capsule (20 mg total) by mouth daily.   POTASSIUM CHLORIDE SA (KLOR-CON) 20 MEQ TABLET    Take 1 tablet (20 mEq total) by mouth daily.   PROMETHAZINE (PHENERGAN) 25 MG TABLET    Take 1 tablet (25 mg total) by mouth every 6 (six) hours as needed for nausea or vomiting.   RANITIDINE (ZANTAC) 150 MG TABLET    Take 1 tablet (150 mg total) by mouth 2 (two) times daily.    Review of Systems  Social History   Tobacco Use  .  Smoking status: Current Every Day Smoker    Packs/day: 0.50    Years: 33.00    Pack years: 16.50    Types: Cigarettes    Start date: 03/04/2017  . Smokeless tobacco: Never Used  . Tobacco comment: not interested in quitting  Substance Use Topics  . Alcohol use: Not Currently    Comment: "seldom" - drinks 350-727m when she does drink   Objective:   BP (!) 142/71   Pulse (!) 52   Temp (!) 97 F (36.1 C)   Ht _0  (1.549 m)   Wt 170 lb 14.4 oz (77.5 kg)   LMP 06/02/2019 (Exact Date)   SpO2 100%   BMI 32.29 kg/m   Physical Exam Constitutional:  Well nourished. Alert and oriented, No acute distress. HEENT: [place] AT, mask in place.  Trachea midline Cardiovascular: No clubbing, cyanosis, or edema. Respiratory: Normal respiratory effort, no increased work of breathing. Neurologic: Grossly  intact, no focal deficits, moving all 4 extremities. Psychiatric: Normal mood and affect.       Assessment & Plan:   1. CKD (chronic kidney disease), stage IV ([place] - followed by nephrology  2. Essential hypertension - continue med's   3. Mixed hyperlipidemia - start simvastatin 5 mg qhs - rtc in one month for lipids     [facility]

## 2019-07-05 ENCOUNTER — Other Ambulatory Visit: Payer: Self-pay

## 2019-07-05 ENCOUNTER — Ambulatory Visit: Payer: Medicaid Other

## 2019-07-05 DIAGNOSIS — E782 Mixed hyperlipidemia: Secondary | ICD-10-CM

## 2019-07-06 LAB — LIPID PANEL
Chol/HDL Ratio: 10 ratio — ABNORMAL HIGH (ref 0.0–4.4)
Cholesterol, Total: 230 mg/dL — ABNORMAL HIGH (ref 100–199)
HDL: 23 mg/dL — ABNORMAL LOW (ref >39)
LDL Chol Calc (NIH): 148 mg/dL — ABNORMAL HIGH (ref 0–99)
Triglycerides: 316 mg/dL — ABNORMAL HIGH (ref 0–149)
VLDL Cholesterol Cal: 59 mg/dL — ABNORMAL HIGH (ref 5–40)

## 2019-07-06 LAB — HEPATIC FUNCTION PANEL
ALT: 6 IU/L (ref 0–32)
AST: 7 IU/L (ref 0–40)
Albumin: 3.9 g/dL (ref 3.8–4.8)
Alkaline Phosphatase: 164 IU/L — ABNORMAL HIGH (ref 48–121)
Bilirubin Total: 0.2 mg/dL (ref 0.0–1.2)
Bilirubin, Direct: 0.1 mg/dL (ref 0.00–0.40)
Total Protein: 6.8 g/dL (ref 6.0–8.5)

## 2019-07-19 ENCOUNTER — Other Ambulatory Visit: Payer: Self-pay

## 2019-07-19 ENCOUNTER — Other Ambulatory Visit: Payer: Self-pay | Admitting: Family Medicine

## 2019-07-19 ENCOUNTER — Ambulatory Visit: Payer: Medicaid Other | Admitting: Family Medicine

## 2019-07-19 VITALS — BP 110/69 | HR 51 | Wt 167.7 lb

## 2019-07-19 DIAGNOSIS — J3089 Other allergic rhinitis: Secondary | ICD-10-CM

## 2019-07-19 DIAGNOSIS — E782 Mixed hyperlipidemia: Secondary | ICD-10-CM

## 2019-07-19 DIAGNOSIS — K219 Gastro-esophageal reflux disease without esophagitis: Secondary | ICD-10-CM

## 2019-07-19 DIAGNOSIS — I10 Essential (primary) hypertension: Secondary | ICD-10-CM

## 2019-07-19 MED ORDER — CLONIDINE HCL 0.3 MG PO TABS: ORAL_TABLET | ORAL | 2 refills | Status: DC

## 2019-07-19 MED ORDER — SIMVASTATIN 5 MG PO TABS: 5.0000 mg | ORAL_TABLET | Freq: Every day | ORAL | 0 refills | Status: DC

## 2019-07-19 MED ORDER — OMEPRAZOLE 20 MG PO CPDR: 20.0000 mg | DELAYED_RELEASE_CAPSULE | Freq: Every day | ORAL | 0 refills | Status: DC

## 2019-07-19 MED ORDER — CETIRIZINE HCL 10 MG PO TABS: 10.0000 mg | ORAL_TABLET | Freq: Every day | ORAL | 0 refills | Status: DC

## 2019-07-19 MED ORDER — AMLODIPINE BESYLATE 10 MG PO TABS: 10.0000 mg | ORAL_TABLET | Freq: Every day | ORAL | 0 refills | Status: DC

## 2019-07-19 MED ORDER — FUROSEMIDE 40 MG PO TABS: 40.0000 mg | ORAL_TABLET | Freq: Every day | ORAL | 0 refills | Status: DC

## 2019-07-19 NOTE — Progress Notes (Signed)
[name]   Progress Note: General Provider: Lanae Boast, FNP  SUBJECTIVE:   Gabrielle Schultz is a 49 y.o. female who  has a past medical history of Acid reflux, Allergic rhinitis (06/26/2015), Allergy, Asthma, Carpal tunnel syndrome, Chronic kidney disease, Colitis, CVA (cerebral infarction), History of syncope, Hypertension (2008), Low HDL (under 40) (06/26/2015), MI (myocardial infarction) ([place]), Migraines, Poor circulation, Renal insufficiency, Stroke ([place]), and Syncope and collapse.. Patient presents today for Follow-up (lab results) Patient states that she was informed to come to the clinic due to abnormal labs. Patient states that she did not fast prior to lab work due to transportation issues. She reports that she is compliant with medication. No other problems or concerns.   Review of Systems  Constitutional: Negative.   HENT: Negative.   Eyes: Negative.   Respiratory: Negative.   Cardiovascular: Negative.   Gastrointestinal: Negative.   Genitourinary: Negative.   Musculoskeletal: Negative.   Skin: Negative.   Neurological: Negative.   Psychiatric/Behavioral: Negative.      OBJECTIVE: BP 110/69   Pulse (!) 51   Wt 167 lb 11.2 oz (76.1 kg)   SpO2 96%   BMI 31.69 kg/m   Wt Readings from Last 3 Encounters:  07/19/19 167 lb 11.2 oz (76.1 kg)  06/07/19 170 lb 14.4 oz (77.5 kg)  05/10/19 170 lb 9.6 oz (77.4 kg)     Physical Exam Vitals and nursing note reviewed.  Constitutional:      General: She is not in acute distress.    Appearance: She is well-developed.  HENT:     Head: Normocephalic and atraumatic.  Eyes:     Conjunctiva/sclera: Conjunctivae normal.     Pupils: Pupils are equal, round, and reactive to light.  Cardiovascular:     Rate and Rhythm: Normal rate and regular rhythm.     Heart sounds: Normal heart sounds.  Pulmonary:     Effort: Pulmonary effort is normal. No respiratory distress.     Breath sounds: Normal breath sounds.    Abdominal:     General: Bowel sounds are normal. There is no distension.     Palpations: Abdomen is soft.  Musculoskeletal:        General: Normal range of motion.     Cervical back: Normal range of motion.  Skin:    General: Skin is warm and dry.  Neurological:     Mental Status: She is alert and oriented to person, place, and time.  Psychiatric:        Mood and Affect: Mood normal.        Behavior: Behavior normal.        Thought Content: Thought content normal.        Judgment: Judgment normal.     ASSESSMENT/PLAN:  1. Essential hypertension No medication changes warranted at the present time.   - furosemide (LASIX) 40 MG tablet; Take 1 tablet (40 mg total) by mouth daily.  Dispense: 90 tablet; Refill: 0 - cloNIDine (CATAPRES) 0.3 MG tablet; TAKE ONE TABLET BY MOUTH 3 TIMES A DAY  Dispense: 90 tablet; Refill: 2 - amLODipine (NORVASC) 10 MG tablet; Take 1 tablet (10 mg total) by mouth daily.  Dispense: 60 tablet; Refill: 0 - Comprehensive metabolic panel; Future  2. Gastroesophageal reflux disease without esophagitis - omeprazole (PRILOSEC) 20 MG capsule; Take 1 capsule (20 mg total) by mouth daily.  Dispense: 90 capsule; Refill: 0  3. Essential hypertension - furosemide (LASIX) 40 MG tablet; Take 1 tablet (40  mg total) by mouth daily.  Dispense: 90 tablet; Refill: 0 - cloNIDine (CATAPRES) 0.3 MG tablet; TAKE ONE TABLET BY MOUTH 3 TIMES A DAY  Dispense: 90 tablet; Refill: 2 - amLODipine (NORVASC) 10 MG tablet; Take 1 tablet (10 mg total) by mouth daily.  Dispense: 60 tablet; Refill: 0 - Comprehensive metabolic panel; Future  4. Allergic rhinitis due to other allergic trigger, unspecified seasonality - cetirizine (ZYRTEC) 10 MG tablet; Take 1 tablet (10 mg total) by mouth daily.  Dispense: 90 tablet; Refill: 0  5. Essential hypertension - furosemide (LASIX) 40 MG tablet; Take 1 tablet (40 mg total) by mouth daily.  Dispense: 90 tablet; Refill: 0 - cloNIDine (CATAPRES) 0.3  MG tablet; TAKE ONE TABLET BY MOUTH 3 TIMES A DAY  Dispense: 90 tablet; Refill: 2 - amLODipine (NORVASC) 10 MG tablet; Take 1 tablet (10 mg total) by mouth daily.  Dispense: 60 tablet; Refill: 0 - Comprehensive metabolic panel; Future  6. Mixed hyperlipidemia - simvastatin (ZOCOR) 5 MG tablet; Take 1 tablet (5 mg total) by mouth at bedtime.  Dispense: 90 tablet; Refill: 0 - Lipid Panel With LDL/HDL Ratio; Future  Reviewed lab work with patient. Advised not to eat or drink 6 hours prior to labs.   Return in about 2 months (around 09/19/2019), or fasting labs 1 week prior, for Hyperlipdemia.    The patient was given clear instructions to go to ER or return to medical center if symptoms do not improve, worsen or new problems develop. The patient verbalized understanding and agreed with plan of care.   [name], FNP-BC [place]

## 2019-07-19 NOTE — Patient Instructions (Signed)

## 2019-07-30 ENCOUNTER — Other Ambulatory Visit: Payer: Medicaid Other | Admitting: Pharmacist

## 2019-08-13 ENCOUNTER — Telehealth: Payer: Self-pay | Admitting: Pharmacist

## 2019-08-13 NOTE — Telephone Encounter (Signed)
08/13/2019 11:27:08 AM - Called [facility] for refill Ventolin & Advair  -- Elmer Picker - Monday, August 13, 2019 11:26 AM --Hulen Skains [facility] spoke with [facility] for refill on Ventolin & Advair 100/50, allow 7-10 business days to receive.

## 2019-08-14 ENCOUNTER — Other Ambulatory Visit: Payer: Self-pay | Admitting: Gerontology

## 2019-09-20 ENCOUNTER — Other Ambulatory Visit: Payer: Medicaid Other

## 2019-09-20 ENCOUNTER — Other Ambulatory Visit: Payer: Self-pay

## 2019-09-20 DIAGNOSIS — E782 Mixed hyperlipidemia: Secondary | ICD-10-CM

## 2019-09-20 DIAGNOSIS — I10 Essential (primary) hypertension: Secondary | ICD-10-CM

## 2019-09-21 LAB — COMPREHENSIVE METABOLIC PANEL
ALT: 8 IU/L (ref 0–32)
AST: 9 IU/L (ref 0–40)
Albumin/Globulin Ratio: 1.3 (ref 1.2–2.2)
Albumin: 4.1 g/dL (ref 3.8–4.8)
Alkaline Phosphatase: 161 IU/L — ABNORMAL HIGH (ref 44–121)
BUN/Creatinine Ratio: 9 (ref 9–23)
BUN: 18 mg/dL (ref 6–24)
Bilirubin Total: 0.2 mg/dL (ref 0.0–1.2)
CO2: 24 mmol/L (ref 20–29)
Calcium: 9.3 mg/dL (ref 8.7–10.2)
Chloride: 103 mmol/L (ref 96–106)
Creatinine, Ser: 1.91 mg/dL — ABNORMAL HIGH (ref 0.57–1.00)
GFR calc Af Amer: 36 mL/min/{1.73_m2} — ABNORMAL LOW (ref >59)
GFR calc non Af Amer: 31 mL/min/{1.73_m2} — ABNORMAL LOW (ref >59)
Globulin, Total: 3.1 g/dL (ref 1.5–4.5)
Glucose: 101 mg/dL — ABNORMAL HIGH (ref 65–99)
Potassium: 4.3 mmol/L (ref 3.5–5.2)
Sodium: 139 mmol/L (ref 134–144)
Total Protein: 7.2 g/dL (ref 6.0–8.5)

## 2019-09-21 LAB — LIPID PANEL WITH LDL/HDL RATIO
Cholesterol, Total: 177 mg/dL (ref 100–199)
HDL: 24 mg/dL — ABNORMAL LOW (ref >39)
LDL Chol Calc (NIH): 116 mg/dL — ABNORMAL HIGH (ref 0–99)
LDL/HDL Ratio: 4.8 ratio — ABNORMAL HIGH (ref 0.0–3.2)
Triglycerides: 209 mg/dL — ABNORMAL HIGH (ref 0–149)
VLDL Cholesterol Cal: 37 mg/dL (ref 5–40)

## 2019-09-24 ENCOUNTER — Other Ambulatory Visit: Payer: Self-pay

## 2019-09-24 ENCOUNTER — Ambulatory Visit: Payer: Medicaid Other | Admitting: Pharmacist

## 2019-09-24 DIAGNOSIS — Z79899 Other long term (current) drug therapy: Secondary | ICD-10-CM

## 2019-09-24 NOTE — Progress Notes (Addendum)
Medication Management Clinic Visit Note  Patient: Gabrielle Schultz MRN: [id number] Date of Birth: 06-19-1970 PCP: Langston Reusing, NP   [name] 49 y.o. female presents for a MTM visit today via telephone. Identified by name and DOB.  There were no vitals taken for this visit.  Patient Information   Past Medical History:  Diagnosis Date  . Acid reflux   . Allergic rhinitis 06/26/2015  . Allergy   . Asthma   . Carpal tunnel syndrome    right hand  . Chronic kidney disease   . Colitis   . CVA (cerebral infarction)    2009, 2015  . History of syncope   . Hypertension 2008  . Low HDL (under 40) 06/26/2015  . MI (myocardial infarction) ([place])    2015 - patient wasnt told by cardiologist that she had MI but has had cardiac cath in past  . Migraines   . Poor circulation   . Renal insufficiency   . Stroke ([place])   . Syncope and collapse       Past Surgical History:  Procedure Laterality Date  . CARDIAC CATHETERIZATION    . CESAREAN SECTION  1994   Dr. Ammie Dalton  . COLONOSCOPY WITH PROPOFOL N/A 06/26/2014   Procedure: COLONOSCOPY WITH PROPOFOL;  Surgeon: Josefine Class, MD;  Location: [facility] ENDOSCOPY;  Service: Endoscopy;  Laterality: N/A;  . DILATION AND CURETTAGE OF UTERUS  x2 for incomplete SABs     Family History  Problem Relation Age of Onset  . Hyperlipidemia Mother   . Heart disease Mother        has a defibrillator  . COPD Mother   . Hypertension Father   . Heart disease Father   . Hyperlipidemia Father   . Hypertension Sister        died in her 30s from heart failure and end stage kidney disease  . Kidney disease Sister   . Hypertension Brother   . Heart disease Brother        has a defibrillator  . Coronary artery disease Brother        had CABG  . Healthy Child   . Diabetes Child     Family Support: Good  Lifestyle Diet: Breakfast:doesnt eat breakfast Lunch: watermelon Dinner:baked chicken; fried four chicken breast; mac and  cheese; fried potatotes Drinks: tea, water, Koolaid (hawaiian punch)           Social History   Substance and Sexual Activity  Alcohol Use Not Currently   Comment: "seldom" - drinks 350-757m when she does drink      Social History   Tobacco Use  Smoking Status Current Every Day Smoker  . Packs/day: 0.50  . Years: 33.00  . Pack years: 16.50  . Types: Cigarettes  . Start date: 03/04/2017  Smokeless Tobacco Never Used  Tobacco Comment   not interested in quitting      Health Maintenance  Topic Date Due  . PNEUMOCOCCAL POLYSACCHARIDE VACCINE AGE 82-64 HIGH RISK  Never done  . FOOT EXAM  Never done  . OPHTHALMOLOGY EXAM  Never done  . COVID-19 Vaccine (1) Never done  . TETANUS/TDAP  Never done  . PAP SMEAR-Modifier  Never done  . INFLUENZA VACCINE  Never done  . HEMOGLOBIN A1C  10/05/2019  . Hepatitis C Screening  Completed  . HIV Screening  Completed   Health Maintenance/Date Completed  Last ED visit: none recent Last Visit to PCP: 7/15 Next Visit to PCP: 9/23  Specialist Visit: nephrologist 9/24 Dental Exam: not recent. Insurance won't pay for it.  Eye Exam: used to see Dr. Jefferson Fuel at [facility].  Pelvic/PAP Exam: not since year Beverlee Nims was born (Q 3 years) Mammogram: had mammogram when living in [place]. Had to do it twice (had to go back a week later), re-did it. (Free clinic [facility]) Colonoscopy: 2015 Flu Vaccine: no. Pneumonia Vaccine: no. COVID-19 Vaccine: no.  Shingrix Vaccine: no.    Outpatient Encounter Medications as of 09/24/2019  Medication Sig  . albuterol (VENTOLIN HFA) 108 (90 Base) MCG/ACT inhaler INHALE 2 PUFFS EVERY 4 HOURS AS NEEDED FOR COUGH AND WHEEZING.  Marland Kitchen allopurinol (ZYLOPRIM) 100 MG tablet Take 1 tablet (100 mg total) by mouth daily.  Marland Kitchen aspirin 81 MG chewable tablet Chew 81 mg by mouth daily.  . calcitRIOL (ROCALTROL) 0.25 MCG capsule Take 0.25 mcg by mouth daily.  . cetirizine (ZYRTEC) 10 MG tablet Take 1 tablet (10 mg  total) by mouth daily.  . cloNIDine (CATAPRES) 0.3 MG tablet TAKE ONE TABLET BY MOUTH 3 TIMES A DAY  . fluticasone (FLONASE) 50 MCG/ACT nasal spray PLACE 1 SPRAY INTO BOTH NOSTRILS 2 TIMES A DAY  . Fluticasone-Salmeterol (ADVAIR) 100-50 MCG/DOSE AEPB Inhale 1 puff into the lungs 2 (two) times daily.  . furosemide (LASIX) 40 MG tablet Take 1 tablet (40 mg total) by mouth daily.  Marland Kitchen omeprazole (PRILOSEC) 20 MG capsule Take 1 capsule (20 mg total) by mouth daily.  . potassium chloride SA (KLOR-CON) 20 MEQ tablet Take 1 tablet (20 mEq total) by mouth daily.  . promethazine (PHENERGAN) 25 MG tablet TAKE ONE TABLET BY MOUTH EVERY 6 HOURS AS NEEDED FOR NAUSEA OR VOMITING  . ranitidine (ZANTAC) 150 MG tablet Take 1 tablet (150 mg total) by mouth 2 (two) times daily.  . simvastatin (ZOCOR) 5 MG tablet Take 1 tablet (5 mg total) by mouth at bedtime.  Marland Kitchen amLODipine (NORVASC) 10 MG tablet Take 1 tablet (10 mg total) by mouth daily.  Marland Kitchen ipratropium (ATROVENT) 0.03 % nasal spray Place 2 sprays into both nostrils every 12 (twelve) hours. (Patient not taking: Reported on 05/10/2019)   No facility-administered encounter medications on file as of 09/24/2019.       Assessment and Plan:  Adherence/Med Management: Patient requests refills of promethazine and Calcitriol. Began process to refill Calcitriol. Informed patient there were no refills on  promethazine, and advised her to speak with her doctor about sending a new prescription.   Asthma: Hx asthma, which patient states, worsens in extreme heat, extreme cold, and when she is near grass. On albtuerol and Advair (100-50). Patient is requiring albuterol inhaler 6 X / day, which has increased in frequency as of lately. Additionally, patient is experiencing nighttime awakenings with trouble breathing, and coughing in the middle of the night. Notably, patient is experiencing chest pain, however patient also has acid reflux, CKD, and takes furosemide for fluid. Advised  patient to report these symptoms to her doctor at [facility] during appointment on Thursday (9/23). Additionally recommended patient to keep her inhaler close by the bedside at night.  Acid reflux: History GERD. Despite taking omeprazole 20 mg daily, avoiding tomatoes, eating ~4-5 hours before bedtime, and elevating head of bed at night, breakthrough symptoms of reflux at night continue to recur. Patient states she self-medicates with either Zantac 150 mg (has old supply), or takes OTC omeprazole 20 mg to treat breakthrough symptoms. Due to concomitant CKD, advised patient to talk with her PCP about  this, because other medications that could be utilized for breakthrough symptoms, are broken down in the kidney. Will also f/u with NP at [facility].  Fluid (concomitant CKD Stage 3b): Patient reports taking furosemide to "keep fluid off of heart". Taking Potassium Chloride 20 mEq. Taking both medications everyday.  HTN/CKD stage 3b: Patient has extensive hx of HTN, and concomitant CKD Stage 3b with trials of numerous medications. Does not check BP at home. In past, carvedilol, isosorbide/hydralazine were trialed. Patient states hydralazine caused drowsiness. Patient believes carvedilol caused heart to race, however possibility that this was related to isosorbide mononitrate, as this can also cause tachyphylaxis. Regardless, patient states she is satisfied with clonidine and amlodipine. Patient reports nephrologist is hoping to take her off of the clonidine, as she was told it is last line for HTN. I acknowledged this. Patient does not wish to discontinue clonidine however, because reportedly, last time she stopped taking clonidine due to lapse in refills, she experienced episodes of withdrawal. Asked patient if she has ever tried a medication called metoprolol succinate-patient did not recall. Will follow up with BP at [facility].  Hx MI/Hx stroke/HLD: Hx MI in 2014, and history of stroke (2007 and 2014). On aspirin 81 mg daily  (receiving OTC from [facility]) and simvastatin 5 mg daily. Patient states being on 20 mg simvastatin a few years ago. Based on cardiac event hx, pt is indicated to receive high intensity statin. Will consult with NP at [facility].   Allergies: History of allergic rhinitis/ Currently taking cetirizine. 6 months ago, PCP discontinued Flonase d/t drying of the nose. Currently, patient reports nose is still dry, so much that it hurts when she blows her nose. [facility] sent Atrovent Rx, but [facility] could not fill, as we do not carry this medication. Advised patient to alert her PCP of these symptoms during visit on Thursday.  Gout: Hx gout flare, for which uric acid levels taken, and started on allopurinol. Continues on this medication.  Smoking cessation: History smoking 33 years, currently smoking 0.5 PPD (16 pack-year hx). Asked patient if she is interested in cutting back, and offerred 1800QUITLINE as a resource. Patient declined.   Health maintenance: Patient does not wish to receive flu vaccine this year.  Patient reports frustration with her Family planning Medicaid, as it does not pay for her to see a cardiologist, dentist, optometrist or nephrologist. Paying for nephrologist appointments out of pocket. Patient was seeing an optometrist at [facility] until he retired. Advised patient to ask [facility] about future options for an optometrist at her appointment on Thursday.    RTC: 6 months    Glean Salvo, Festus Aloe PharmD Student

## 2019-09-27 ENCOUNTER — Ambulatory Visit: Payer: Medicaid Other | Admitting: Family Medicine

## 2019-09-27 ENCOUNTER — Other Ambulatory Visit: Payer: Self-pay

## 2019-09-27 VITALS — BP 131/70 | HR 58 | Ht 61.0 in | Wt 161.8 lb

## 2019-09-27 DIAGNOSIS — N184 Chronic kidney disease, stage 4 (severe): Secondary | ICD-10-CM

## 2019-09-27 DIAGNOSIS — E782 Mixed hyperlipidemia: Secondary | ICD-10-CM

## 2019-09-27 DIAGNOSIS — J3089 Other allergic rhinitis: Secondary | ICD-10-CM

## 2019-09-27 MED ORDER — SIMVASTATIN 10 MG PO TABS: 10.0000 mg | ORAL_TABLET | Freq: Every day | ORAL | 2 refills | Status: DC

## 2019-09-27 NOTE — Patient Instructions (Signed)

## 2019-09-27 NOTE — Progress Notes (Signed)
[facility] Gabrielle Schultz   Progress Note: General Provider: Lanae Boast, FNP  SUBJECTIVE:   Gabrielle Schultz is a 49 y.o. female who  has a past medical history of Acid reflux, Allergic rhinitis (06/26/2015), Allergy, Asthma, Carpal tunnel syndrome, Chronic kidney disease, Colitis, CVA (cerebral infarction), Gout, History of syncope, Hypertension (2008), Low HDL (under 40) (06/26/2015), MI (myocardial infarction) (Gabrielle Schultz), Migraines, Poor circulation, Renal insufficiency, Stroke (Gabrielle Schultz), and Syncope and collapse.. Patient presents today for Follow-up (labs) and Cough  Patient states that she has a cough that is worse upon awakening and clears throughout the day. Relieved with use of hot fluids. No longer taking flonase due to dryness.  She is here to discuss labs. She is followed by nephrology and has an appt tomorrow.  Review of Systems  Respiratory: Positive for cough.   All other systems reviewed and are negative.    OBJECTIVE: BP 131/70   Pulse (!) 58   Ht 5\' 1"  (1.549 m)   Wt 161 lb 12.8 oz (73.4 kg)   SpO2 98%   BMI 30.57 kg/m   Wt Readings from Last 3 Encounters:  09/27/19 161 lb 12.8 oz (73.4 kg)  07/19/19 167 lb 11.2 oz (76.1 kg)  06/07/19 170 lb 14.4 oz (77.5 kg)     Physical Exam Vitals and nursing note reviewed.  Constitutional:      General: She is not in acute distress.    Appearance: She is well-developed.  HENT:     Head: Normocephalic and atraumatic.  Eyes:     General: No scleral icterus.    Conjunctiva/sclera: Conjunctivae normal.     Pupils: Pupils are equal, round, and reactive to light.  Neck:     Vascular: No JVD.  Cardiovascular:     Rate and Rhythm: Normal rate and regular rhythm.     Heart sounds: Normal heart sounds. No murmur heard.  No friction rub. No gallop.   Pulmonary:     Effort: Pulmonary effort is normal. No respiratory distress.     Breath sounds: Normal breath sounds. No wheezing or rales.  Chest:     Chest wall: No tenderness.    Musculoskeletal:        General: Normal range of motion.     Cervical back: Normal range of motion and neck supple.  Lymphadenopathy:     Cervical: No cervical adenopathy.  Skin:    General: Skin is warm and dry.  Neurological:     Mental Status: She is alert and oriented to person, place, and time.  Psychiatric:        Mood and Affect: Mood normal.        Behavior: Behavior normal.        Thought Content: Thought content normal.        Judgment: Judgment normal.     ASSESSMENT/PLAN:  1. Mixed hyperlipidemia Refilled medications.  - simvastatin (ZOCOR) 10 MG tablet; Take 1 tablet (10 mg total) by mouth at bedtime.  Dispense: 30 tablet; Refill: 2  2. CKD (chronic kidney disease), stage IV ([place]) Followed by nephrology  3. Allergic rhinitis due to other allergic trigger, unspecified seasonality Discussed that cough is most likely due to allergies. Continue with current medications.     Return in about 3 months (around 12/27/2019).    The patient was given clear instructions to go to ER or return to medical center if symptoms do not improve, worsen or new problems develop. The patient verbalized understanding and agreed with plan  of care.   [name]. Gabrielle Canary, FNP-BC [place]

## 2019-11-19 ENCOUNTER — Other Ambulatory Visit: Payer: Self-pay | Admitting: Family Medicine

## 2019-11-19 DIAGNOSIS — I10 Essential (primary) hypertension: Secondary | ICD-10-CM

## 2019-11-30 ENCOUNTER — Encounter (HOSPITAL_COMMUNITY): Payer: Self-pay | Admitting: Neurology

## 2019-11-30 ENCOUNTER — Emergency Department (HOSPITAL_COMMUNITY): Payer: Self-pay

## 2019-11-30 ENCOUNTER — Inpatient Hospital Stay (HOSPITAL_COMMUNITY)
Admission: EM | Admit: 2019-11-30 | Discharge: 2019-12-03 | DRG: 062 | Disposition: A | Discharge status: Home Health | Payer: Self-pay | Attending: Neurology | Admitting: Neurology

## 2019-11-30 ENCOUNTER — Other Ambulatory Visit: Payer: Self-pay

## 2019-11-30 DIAGNOSIS — Z888 Allergy status to other drugs, medicaments and biological substances status: Secondary | ICD-10-CM

## 2019-11-30 DIAGNOSIS — N179 Acute kidney failure, unspecified: Secondary | ICD-10-CM | POA: Diagnosis present

## 2019-11-30 DIAGNOSIS — I219 Acute myocardial infarction, unspecified: Secondary | ICD-10-CM | POA: Diagnosis present

## 2019-11-30 DIAGNOSIS — N189 Chronic kidney disease, unspecified: Secondary | ICD-10-CM | POA: Diagnosis present

## 2019-11-30 DIAGNOSIS — I129 Hypertensive chronic kidney disease with stage 1 through stage 4 chronic kidney disease, or unspecified chronic kidney disease: Secondary | ICD-10-CM | POA: Diagnosis present

## 2019-11-30 DIAGNOSIS — F17210 Nicotine dependence, cigarettes, uncomplicated: Secondary | ICD-10-CM | POA: Diagnosis present

## 2019-11-30 DIAGNOSIS — F17200 Nicotine dependence, unspecified, uncomplicated: Secondary | ICD-10-CM | POA: Diagnosis present

## 2019-11-30 DIAGNOSIS — M3211 Endocarditis in systemic lupus erythematosus: Secondary | ICD-10-CM | POA: Diagnosis present

## 2019-11-30 DIAGNOSIS — I2510 Atherosclerotic heart disease of native coronary artery without angina pectoris: Secondary | ICD-10-CM | POA: Diagnosis present

## 2019-11-30 DIAGNOSIS — R000 Tachycardia, unspecified: Secondary | ICD-10-CM | POA: Diagnosis not present

## 2019-11-30 DIAGNOSIS — I252 Old myocardial infarction: Secondary | ICD-10-CM | POA: Diagnosis present

## 2019-11-30 DIAGNOSIS — Z91018 Allergy to other foods: Secondary | ICD-10-CM

## 2019-11-30 DIAGNOSIS — Z6830 Body mass index (BMI) 30.0-30.9, adult: Secondary | ICD-10-CM

## 2019-11-30 DIAGNOSIS — Z7982 Long term (current) use of aspirin: Secondary | ICD-10-CM

## 2019-11-30 DIAGNOSIS — Z841 Family history of disorders of kidney and ureter: Secondary | ICD-10-CM

## 2019-11-30 DIAGNOSIS — I639 Cerebral infarction, unspecified: Principal | ICD-10-CM | POA: Diagnosis present

## 2019-11-30 DIAGNOSIS — G8324 Monoplegia of upper limb affecting left nondominant side: Secondary | ICD-10-CM | POA: Diagnosis present

## 2019-11-30 DIAGNOSIS — J309 Allergic rhinitis, unspecified: Secondary | ICD-10-CM | POA: Diagnosis present

## 2019-11-30 DIAGNOSIS — Z7951 Long term (current) use of inhaled steroids: Secondary | ICD-10-CM

## 2019-11-30 DIAGNOSIS — R29705 NIHSS score 5: Secondary | ICD-10-CM | POA: Diagnosis present

## 2019-11-30 DIAGNOSIS — R270 Ataxia, unspecified: Secondary | ICD-10-CM | POA: Diagnosis present

## 2019-11-30 DIAGNOSIS — K219 Gastro-esophageal reflux disease without esophagitis: Secondary | ICD-10-CM | POA: Diagnosis present

## 2019-11-30 DIAGNOSIS — Z881 Allergy status to other antibiotic agents status: Secondary | ICD-10-CM

## 2019-11-30 DIAGNOSIS — Z880 Allergy status to penicillin: Secondary | ICD-10-CM

## 2019-11-30 DIAGNOSIS — Z716 Tobacco abuse counseling: Secondary | ICD-10-CM

## 2019-11-30 DIAGNOSIS — E875 Hyperkalemia: Secondary | ICD-10-CM | POA: Diagnosis present

## 2019-11-30 DIAGNOSIS — G43909 Migraine, unspecified, not intractable, without status migrainosus: Secondary | ICD-10-CM | POA: Diagnosis present

## 2019-11-30 DIAGNOSIS — Z9189 Other specified personal risk factors, not elsewhere classified: Secondary | ICD-10-CM

## 2019-11-30 DIAGNOSIS — N183 Chronic kidney disease, stage 3 unspecified: Secondary | ICD-10-CM | POA: Diagnosis present

## 2019-11-30 DIAGNOSIS — E785 Hyperlipidemia, unspecified: Secondary | ICD-10-CM | POA: Diagnosis present

## 2019-11-30 DIAGNOSIS — I10 Essential (primary) hypertension: Secondary | ICD-10-CM | POA: Diagnosis present

## 2019-11-30 DIAGNOSIS — Z720 Tobacco use: Secondary | ICD-10-CM | POA: Diagnosis present

## 2019-11-30 DIAGNOSIS — H53461 Homonymous bilateral field defects, right side: Secondary | ICD-10-CM | POA: Diagnosis present

## 2019-11-30 DIAGNOSIS — E669 Obesity, unspecified: Secondary | ICD-10-CM | POA: Diagnosis present

## 2019-11-30 DIAGNOSIS — I69351 Hemiplegia and hemiparesis following cerebral infarction affecting right dominant side: Secondary | ICD-10-CM

## 2019-11-30 DIAGNOSIS — Z79899 Other long term (current) drug therapy: Secondary | ICD-10-CM

## 2019-11-30 DIAGNOSIS — Z20822 Contact with and (suspected) exposure to covid-19: Secondary | ICD-10-CM | POA: Diagnosis present

## 2019-11-30 DIAGNOSIS — N1832 Chronic kidney disease, stage 3b: Secondary | ICD-10-CM | POA: Diagnosis present

## 2019-11-30 LAB — DIFFERENTIAL
Abs Immature Granulocytes: 0.04 10*3/uL (ref 0.00–0.07)
Basophils Absolute: 0.1 10*3/uL (ref 0.0–0.1)
Basophils Relative: 1 %
Eosinophils Absolute: 0.3 10*3/uL (ref 0.0–0.5)
Eosinophils Relative: 3 %
Immature Granulocytes: 0 %
Lymphocytes Relative: 28 %
Lymphs Abs: 2.8 10*3/uL (ref 0.7–4.0)
Monocytes Absolute: 0.8 10*3/uL (ref 0.1–1.0)
Monocytes Relative: 9 %
Neutro Abs: 5.9 10*3/uL (ref 1.7–7.7)
Neutrophils Relative %: 59 %

## 2019-11-30 LAB — CBC
HCT: 37.9 % (ref 36.0–46.0)
Hemoglobin: 12.3 g/dL (ref 12.0–15.0)
MCH: 32.7 pg (ref 26.0–34.0)
MCHC: 32.5 g/dL (ref 30.0–36.0)
MCV: 100.8 fL — ABNORMAL HIGH (ref 80.0–100.0)
Platelets: 232 10*3/uL (ref 150–400)
RBC: 3.76 MIL/uL — ABNORMAL LOW (ref 3.87–5.11)
RDW: 16.6 % — ABNORMAL HIGH (ref 11.5–15.5)
WBC: 9.9 10*3/uL (ref 4.0–10.5)
nRBC: 0 % (ref 0.0–0.2)

## 2019-11-30 LAB — PROTIME-INR
INR: 1 (ref 0.8–1.2)
Prothrombin Time: 13 seconds (ref 11.4–15.2)

## 2019-11-30 LAB — COMPREHENSIVE METABOLIC PANEL
ALT: 12 U/L (ref 0–44)
AST: 15 U/L (ref 15–41)
Albumin: 3.5 g/dL (ref 3.5–5.0)
Alkaline Phosphatase: 116 U/L (ref 38–126)
Anion gap: 13 (ref 5–15)
BUN: 22 mg/dL — ABNORMAL HIGH (ref 6–20)
CO2: 20 mmol/L — ABNORMAL LOW (ref 22–32)
Calcium: 9.2 mg/dL (ref 8.9–10.3)
Chloride: 104 mmol/L (ref 98–111)
Creatinine, Ser: 1.92 mg/dL — ABNORMAL HIGH (ref 0.44–1.00)
GFR, Estimated: 32 mL/min — ABNORMAL LOW (ref >60)
Glucose, Bld: 80 mg/dL (ref 70–99)
Potassium: 5 mmol/L (ref 3.5–5.1)
Sodium: 137 mmol/L (ref 135–145)
Total Bilirubin: 1.3 mg/dL — ABNORMAL HIGH (ref 0.3–1.2)
Total Protein: 7.1 g/dL (ref 6.5–8.1)

## 2019-11-30 LAB — POCT I-STAT, CHEM 8
BUN: 32 mg/dL — ABNORMAL HIGH (ref 6–20)
Calcium, Ion: 0.88 mmol/L — CL (ref 1.15–1.40)
Chloride: 110 mmol/L (ref 98–111)
Creatinine, Ser: 2 mg/dL — ABNORMAL HIGH (ref 0.44–1.00)
Glucose, Bld: 84 mg/dL (ref 70–99)
HCT: 45 % (ref 36.0–46.0)
Hemoglobin: 15.3 g/dL — ABNORMAL HIGH (ref 12.0–15.0)
Potassium: 5.5 mmol/L — ABNORMAL HIGH (ref 3.5–5.1)
Sodium: 136 mmol/L (ref 135–145)
TCO2: 19 mmol/L — ABNORMAL LOW (ref 22–32)

## 2019-11-30 LAB — I-STAT BETA HCG BLOOD, ED (MC, WL, AP ONLY): I-stat hCG, quantitative: <5 m[IU]/mL (ref <5)

## 2019-11-30 LAB — HIV ANTIBODY (ROUTINE TESTING W REFLEX): HIV Screen 4th Generation wRfx: NONREACTIVE

## 2019-11-30 LAB — MRSA PCR SCREENING: MRSA by PCR: NEGATIVE

## 2019-11-30 LAB — APTT: aPTT: 26 seconds (ref 24–36)

## 2019-11-30 LAB — ETHANOL: Alcohol, Ethyl (B): <10 mg/dL (ref <10)

## 2019-11-30 LAB — CBG MONITORING, ED: Glucose-Capillary: 86 mg/dL (ref 70–99)

## 2019-11-30 LAB — RESP PANEL BY RT-PCR (FLU A&B, COVID) ARPGX2
Influenza A by PCR: NEGATIVE
Influenza B by PCR: NEGATIVE
SARS Coronavirus 2 by RT PCR: NEGATIVE

## 2019-11-30 MED ORDER — NICOTINE 14 MG/24HR TD PT24
14.0000 mg | MEDICATED_PATCH | Freq: Every day | TRANSDERMAL | Status: DC
  Filled 2019-11-30 (×3): qty 1

## 2019-11-30 MED ORDER — ALBUTEROL SULFATE (2.5 MG/3ML) 0.083% IN NEBU: 3.0000 mL | INHALATION_SOLUTION | RESPIRATORY_TRACT | Status: DC | PRN

## 2019-11-30 MED ORDER — SODIUM CHLORIDE 0.9 % IV SOLN
50.0000 mL | Freq: Once | INTRAVENOUS | Status: AC
  Administered 2019-11-30: 50 mL via INTRAVENOUS

## 2019-11-30 MED ORDER — ACETAMINOPHEN 650 MG RE SUPP: 650.0000 mg | RECTAL | Status: DC | PRN

## 2019-11-30 MED ORDER — ACETAMINOPHEN 160 MG/5ML PO SOLN: 650.0000 mg | ORAL | Status: DC | PRN

## 2019-11-30 MED ORDER — LABETALOL HCL 5 MG/ML IV SOLN: 10.0000 mg | INTRAVENOUS | Status: DC | PRN

## 2019-11-30 MED ORDER — SIMVASTATIN 20 MG PO TABS: 10.0000 mg | ORAL_TABLET | Freq: Every day | ORAL | Status: DC

## 2019-11-30 MED ORDER — PANTOPRAZOLE SODIUM 40 MG IV SOLR
40.0000 mg | Freq: Every day | INTRAVENOUS | Status: DC
  Administered 2019-11-30: 40 mg via INTRAVENOUS
  Filled 2019-11-30: qty 40

## 2019-11-30 MED ORDER — IOHEXOL 350 MG/ML SOLN
100.0000 mL | Freq: Once | INTRAVENOUS | Status: AC | PRN
  Administered 2019-11-30: 60 mL via INTRAVENOUS

## 2019-11-30 MED ORDER — STROKE: EARLY STAGES OF RECOVERY BOOK
Freq: Once | Status: DC
  Filled 2019-11-30 (×2): qty 1

## 2019-11-30 MED ORDER — ALTEPLASE (STROKE) FULL DOSE INFUSION
0.9000 mg/kg | Freq: Once | INTRAVENOUS | Status: AC
  Administered 2019-11-30: 66.4 mg via INTRAVENOUS
  Filled 2019-11-30: qty 100

## 2019-11-30 MED ORDER — SENNOSIDES-DOCUSATE SODIUM 8.6-50 MG PO TABS: 1.0000 | ORAL_TABLET | Freq: Every evening | ORAL | Status: DC | PRN

## 2019-11-30 MED ORDER — ACETAMINOPHEN 325 MG PO TABS
650.0000 mg | ORAL_TABLET | ORAL | Status: DC | PRN
  Administered 2019-12-01 – 2019-12-02 (×2): 650 mg via ORAL
  Filled 2019-11-30 (×2): qty 2

## 2019-11-30 NOTE — Progress Notes (Signed)
Pharmacist Code Stroke Response  Notified to mix tPA at 1515 by Dr. Lorrin Goodell Delivered tPA to RN at 1520  tPA dose = 6.6mg  bolus over 1 minute followed by 59.8mg  for a total dose of 66.4mg  over 1 hour  Issues/delays encountered (if applicable): Pt required IV access and was a difficult stick. Also BP was briefly elevated, likely related to pain from IV stick. CTA was then obtained as well prior to tPA administration.   Mykelle Cockerell, Rande Lawman 11/30/19 3:34 PM

## 2019-11-30 NOTE — ED Triage Notes (Signed)
Pt here via [place] for acute onset L sided weakness.  Hx of previous stroke with R sided deficits.  Pt ao x 4.

## 2019-11-30 NOTE — ED Notes (Signed)
Attempting 2nd IV.

## 2019-11-30 NOTE — H&P (Addendum)
Admission H&P    Chief Complaint: CODE STROKE  HPI: Gabrielle Schultz is an 49 y.o. female with a PMHx of GERD, asthma, Tobacco dependence, CKD, CVA x 2 (2009, 2015), HTN, MI, and migraine HAs. Pt was cooking lunch today at 1430 on 11/30/19 and suddenly had left sided weakness. No aphasia or dysarthria. No fall. 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  EMS was called and pt was activated as a CODE STROKE. Pt went to CT immediately after arriving.  After the initial CTH was negative for an ICH, tPA was offered. We discussed about 30% risk of improvement in stroke symptoms with tPA and about 2-3% risk of ICH and a total of 6% risk of systemic bleeding. The time sensitive nature of this intervention was explained to her. tPA discussion was completed at 1512. Patient wanted to hold off until she spoke to her husband. We discussed with patient's husband over the phone and she consented to tPA. In the CT scanner, RN started large bore IV left antecubital but pt complained of it burning and patient asked for the IV to be removed. This futher delayed tPA administration. It took a couple sticks to place a new IV, in the meantime, her SBP was up to 742V systolic but spontaneously came down. tPA was eventually started.  Pt does not remember having an arrhythmia in the past. Doesn't remember having an echo or TEE. Did have a cardiac cath. On ASA 81mg  at home.   LKW: 1430 on 11/30/19 tPA Given: yes NIHHS-5 Baseline mRS: 1 Thrombectomy: No, she did not have an LVO.   Past Medical History:  Diagnosis Date  . Acid reflux   . Allergic rhinitis 06/26/2015  . Allergy   . Asthma   . Carpal tunnel syndrome    right hand  . Chronic kidney disease   . Colitis   . CVA (cerebral infarction)    2009, 2015  . Gout   . History of syncope   . Hypertension 2008  . Low HDL (under 40) 06/26/2015  . MI (myocardial infarction) ([place])     2015 - patient wasnt told by cardiologist that she had MI but has had cardiac cath in past  . Migraines   . Poor circulation   . Renal insufficiency   . Stroke ([place])   . Syncope and collapse     Past Surgical History:  Procedure Laterality Date  . CARDIAC CATHETERIZATION    . CESAREAN SECTION  1994   Dr. Ammie Dalton  . COLONOSCOPY WITH PROPOFOL N/A 06/26/2014   Procedure: COLONOSCOPY WITH PROPOFOL;  Surgeon: Josefine Class, MD;  Location[facility] ENDOSCOPY;  Service: Endoscopy;  Laterality: N/A;  . DILATION AND CURETTAGE OF UTERUS  x2 for incomplete SABs    Family History  Problem Relation Age of Onset  . Hyperlipidemia Mother   . Heart disease Mother        has a defibrillator  . COPD Mother   . Hypertension Father   . Heart disease Father   . Hyperlipidemia Father   . Hypertension Sister        died in her 13s from heart failure and end stage kidney disease  . Kidney disease Sister   . Hypertension Brother   . Heart disease Brother        has a defibrillator  . Coronary artery disease Brother        had CABG  .  Healthy Child   . Diabetes Child    Social History:  reports that she has been smoking cigarettes. She started smoking about 2 years ago. She has a 16.50 pack-year smoking history. She has never used smokeless tobacco. She reports previous alcohol use. She reports that she does not use drugs.  Allergies:  Allergies  Allergen Reactions  . Coconut Oil Hives and Swelling    Just coconut ("all coconut")  . Amoxicillin Other (See Comments)    "makes me sick" "swelled up everywhere" Has patient had a PCN reaction causing immediate rash, facial/tongue/throat swelling, SOB or lightheadedness with hypotension: No Has patient had a PCN reaction causing severe rash involving mucus membranes or skin necrosis: No Has patient had a PCN reaction that required hospitalization: No Has patient had a PCN reaction occurring within the last 10 years: No If all of the above  answers are "NO", then may proceed with Cephalosporin use.    Marland Kitchen Penicillins Other (See Comments)    "makes me sick" "swelled up oil" Has patient had a PCN reaction causing immediate rash, facial/tongue/throat swelling, SOB or lightheadedness with hypotension: No Has patient had a PCN reaction causing severe rash involving mucus membranes or skin necrosis: No Has patient had a PCN reaction that required hospitalization: No Has patient had a PCN reaction occurring within the last 10 years: Yes If all of the above answers are "NO", then may proceed with Cephalosporin use.   Marland Kitchen Singulair [Montelukast Sodium] Rash    "made heart race"    ROS: Complete ROS and neg except as noted in HPI.   Physical Examination: Blood pressure (!) 113/24, pulse 84, resp. rate 18, weight 73.8 kg, SpO2 100 %.  HEENT-  Normocephalic, no lesions, without obvious abnormality.  Normal external eye and conjunctiva. Normal external ears. Normal external nose, mucus membranes. Normal pharynx. Neck supple with no masses, nodes, nodules or enlargement. Cardiovascular - RRR on tele Lungs - Normal respiratory effort.  Abdomen -soft.   Extremities - moves all 4 extremities spontaneously.   Neurologic Examination: II- PERRL, visual acuity in OD affected by hemanopsia III, IV,VI-eye lids elevate symmetrically, Cardinal fields of gaze are normal in OS, but OD peripheral vision affected.  V-facial sensation symmetrical to light touch. Able to chew.  VII-smile is symmetrical. Able to raise eyebrows, frown, and puff cheeks.  VIII-hearing intact to voice IX, X-palate elevates symmetrically, phonation is normal. No dysphagia.  XI-shoulder shrug 5/5 XII-tongue protrudes, tongue strength 5/5, no fasciculations.  Motor-after CT when back in ED room, pt with formal exam. Motor strength is 5/5 RUE, BLE. LUE 4/5 triceps, 4/5 biceps, 3+/5 grip.Tone normal to LUE, BLE. Tone decreased to RUE, but not rigid or spastic. LUE drift.     DTRs-2+ throughout  Cerebellar-minor ataxia noted with FTN and left LE knee to shin.   Gait: Deferred  Results for orders placed or performed during the hospital encounter of 11/30/19 (from the past 48 hour(s))  CBG monitoring, ED     Status: None   Collection Time: 11/30/19  3:01 PM  Result Value Ref Range   Glucose-Capillary 86 70 - 99 mg/dL    Comment: Glucose reference range applies only to samples taken after fasting for at least 8 hours.  I-STAT, chem 8     Status: Abnormal   Collection Time: 11/30/19  3:07 PM  Result Value Ref Range   Sodium 136 135 - 145 mmol/L   Potassium 5.5 (H) 3.5 - 5.1 mmol/L   Chloride 110  98 - 111 mmol/L   BUN 32 (H) 6 - 20 mg/dL   Creatinine, Ser 2.00 (H) 0.44 - 1.00 mg/dL   Glucose, Bld 84 70 - 99 mg/dL    Comment: Glucose reference range applies only to samples taken after fasting for at least 8 hours.   Calcium, Ion 0.88 (LL) 1.15 - 1.40 mmol/L   TCO2 19 (L) 22 - 32 mmol/L   Hemoglobin 15.3 (H) 12.0 - 15.0 g/dL   HCT 45.0 36 - 46 %   Comment NOTIFIED PHYSICIAN    CT HEAD CODE STROKE WO CONTRAST  Result Date: 11/30/2019 CLINICAL DATA:  Code stroke.  Left-sided weakness. EXAM: CT HEAD WITHOUT CONTRAST TECHNIQUE: Contiguous axial images were obtained from the base of the skull through the vertex without intravenous contrast. COMPARISON:  05/19/2013 FINDINGS: Brain: There is no evidence of an acute infarct, intracranial hemorrhage, mass, midline shift, or extra-axial fluid collection. A moderately large chronic left PCA infarct is unchanged with ex vacuo dilatation of the left occipital horn. There is also an unchanged small chronic left MCA infarct involving the left frontal lobe. Scattered hypodensities elsewhere in the cerebral white matter bilaterally are nonspecific but compatible with mild chronic small vessel ischemic disease, stable to mildly progressed. Vascular: Calcified atherosclerosis at the skull base. No hyperdense vessel. Skull: No  fracture or suspicious osseous lesion. Sinuses/Orbits: Visualized paranasal sinuses and mastoid air cells are clear. Unremarkable orbits. Other: None. ASPECTS [facility] Stroke Program Early CT Score) - Ganglionic level infarction (caudate, lentiform nuclei, internal capsule, insula, M1-M3 cortex): 7 - Supraganglionic infarction (M4-M6 cortex): 3 Total score (0-10 with 10 being normal): 10 IMPRESSION: 1. No evidence of acute intracranial abnormality. 2. ASPECTS is 10. 3. Chronic left PCA and MCA infarcts. These results were communicated to Dr. Lorrin Goodell at 3:18 pm on 11/30/2019 by text page via the [facility] messaging system. Electronically Signed   By: Logan Bores M.D.   On: 11/30/2019 15:18    Assessment: 49 y.o. female with a hx of 2 prior ischemic strokes with residual R hemianopsia  And mild R sided weakness who presented as a CODE STROKE for sudden onset left sided weakness while preparing lunch. LKW of 1415 on 11/30/19. In the CT scanner, she did show mild improvement of her LLE weakness but still had significant debilitating symptoms including profound weakness in her L Arm which is her good arm after her prior stroke.   Stroke Risk Factors - Tobacco abuse, CAD, HTN  Plan: 1. HgbA1c, fasting lipid panel 2. MRI, MRA  of the brain without contrast 3. PT consult, OT consult, Speech consult 4. Echocardiogram 5. Carotid dopplers 6. Prophylactic therapy-Labetalol PRN to keep BP 180. If more than 2 IV pushes of Labetalol, start Clevaprex.  7. Risk factor modification 7. Telemetry monitoring 8. Nicoderm patch. Spoke to pt about smoking cessation.  9. Hold ASA 81mg .  10. Hx asthma-prn Albuterol.  11. ICU admit.   Smoker: Counseled on the importance of quitting but she is not prepared at this time. - Nicotine patch while admitted  GERD: Continue home PPI  HLD:  - continue home Simvastatin - Will increase if LDL > 70  HTN: - Hold home AntiHTN medications. - SBP Goal < 180 after tPA. -  Labetaolol PRN ordered - Will start Cleviprex if SBP is above 180 after 2 PRNs.  Clance Boll, NP/Neuro Exam per Dr. Meredeth Ide, note edited by him. 11/30/2019, 3:38 PM   This patient is critically ill and at significant risk  of neurological worsening, death and care requires constant monitoring of vital signs, hemodynamics,respiratory and cardiac monitoring, neurological assessment, discussion with family, other specialists and medical decision making of high complexity. I spent 75 minutes of neurocritical care time  in the care of  this patient. This was time spent independent of any time provided by nurse practitioner or PA.  Donnetta Simpers [facility] Pager Number [phone] 11/30/2019  5:40 PM

## 2019-11-30 NOTE — ED Notes (Signed)
Attempted report 

## 2019-11-30 NOTE — ED Provider Notes (Signed)
[place] EMERGENCY DEPARTMENT Provider Note   CSN: [id number] Arrival date & time: 11/30/19  1459     History No chief complaint on file.   Gabrielle Schultz is a 49 y.o. female.  HPI Patient seen by me at the [facility] on arrival.  No dysarthria.  Left arm weakness is present.  More complete evaluation at 3:55 PM She reports onset of left arm weakness which caused her to sit down, about 2 PM when she was working at home.  She denies headache, chest pain, abdominal or back pain.  She has previously had similar symptoms when she was feeling anxious.  She denies other recent illnesses.  There are no other known modifying factors.    Past Medical History:  Diagnosis Date  . Acid reflux   . Allergic rhinitis 06/26/2015  . Allergy   . Asthma   . Carpal tunnel syndrome    right hand  . Chronic kidney disease   . Colitis   . CVA (cerebral infarction)    2009, 2015  . Gout   . History of syncope   . Hypertension 2008  . Low HDL (under 40) 06/26/2015  . MI (myocardial infarction) ([place])    2015 - patient wasnt told by cardiologist that she had MI but has had cardiac cath in past  . Migraines   . Poor circulation   . Renal insufficiency   . Stroke ([place])   . Syncope and collapse     Patient Active Problem List   Diagnosis Date Noted  . Edema of both feet 09/23/2018  . Urine frequency 10/06/2017  . SIRS (systemic inflammatory response syndrome) ([place]) 01/18/2017  . Abdominal pain 01/18/2017  . Chest pain 08/28/2016  . Edema of both feet 04/22/2016  . Abnormal TSH 04/22/2016  . Abdominal pain, right upper quadrant 12/02/2015  . Dermatitis 12/02/2015  . Urinary tract infection 10/28/2015  . Low HDL (under 40) 06/26/2015  . Allergic rhinitis 06/26/2015  . Tobacco abuse 06/26/2015  . Cerebral infarct ([place]) 11/21/2013  . CKD (chronic kidney disease), stage IV ([place]) 11/21/2013  . Hypertension 08/12/2011  . LVH (left ventricular hypertrophy) 08/12/2011  .  Chronic renal insufficiency 08/12/2011    Past Surgical History:  Procedure Laterality Date  . CARDIAC CATHETERIZATION    . CESAREAN SECTION  1994   Dr. Ammie Dalton  . COLONOSCOPY WITH PROPOFOL N/A 06/26/2014   Procedure: COLONOSCOPY WITH PROPOFOL;  Surgeon: Josefine Class, MD;  [facility];  Service: Endoscopy;  Laterality: N/A;  . DILATION AND CURETTAGE OF UTERUS  x2 for incomplete SABs     OB History    Gravida  5   Para  2   Term  1   Preterm  1   AB  3   Living  2     SAB  3   TAB      Ectopic      Multiple      Live Births  2           Family History  Problem Relation Age of Onset  . Hyperlipidemia Mother   . Heart disease Mother        has a defibrillator  . COPD Mother   . Hypertension Father   . Heart disease Father   . Hyperlipidemia Father   . Hypertension Sister        died in her 6s from heart failure and end stage kidney disease  . Kidney disease  Sister   . Hypertension Brother   . Heart disease Brother        has a defibrillator  . Coronary artery disease Brother        had CABG  . Healthy Child   . Diabetes Child     Social History   Tobacco Use  . Smoking status: Current Every Day Smoker    Packs/day: 0.50    Years: 33.00    Pack years: 16.50    Types: Cigarettes    Start date: 03/04/2017  . Smokeless tobacco: Never Used  . Tobacco comment: not interested in quitting  Vaping Use  . Vaping Use: Never used  Substance Use Topics  . Alcohol use: Not Currently    Comment: "seldom" - drinks 350-734mL when she does drink  . Drug use: No    Home Medications Prior to Admission medications   Medication Sig Start Date End Date Taking? Authorizing Provider  albuterol (VENTOLIN HFA) 108 (90 Base) MCG/ACT inhaler INHALE 2 PUFFS EVERY 4 HOURS AS NEEDED FOR COUGH AND WHEEZING. 02/08/19   Ernestine Conrad, Larene Beach A, PA-C  allopurinol (ZYLOPRIM) 100 MG tablet Take 1 tablet (100 mg total) by mouth daily. 02/08/19 02/08/20  Zara Council A, PA-C  amLODipine (NORVASC) 10 MG tablet Take 1 tablet (10 mg total) by mouth daily. 07/19/19 09/17/19  Lanae Boast, FNP  aspirin 81 MG chewable tablet Chew 81 mg by mouth daily.    [provider]  calcitRIOL (ROCALTROL) 0.25 MCG capsule Take 0.25 mcg by mouth daily. Patient not taking: Reported on 09/27/2019    [provider]  cetirizine (ZYRTEC) 10 MG tablet Take 1 tablet (10 mg total) by mouth daily. 07/19/19   Lanae Boast, FNP  cloNIDine (CATAPRES) 0.3 MG tablet TAKE ONE TABLET BY MOUTH 3 TIMES A DAY 07/19/19   Lanae Boast, FNP  fluticasone (FLONASE) 50 MCG/ACT nasal spray PLACE 1 SPRAY INTO BOTH NOSTRILS 2 TIMES A DAY Patient not taking: Reported on 09/27/2019 03/30/18   Zara Council A, PA-C  Fluticasone-Salmeterol (ADVAIR) 100-50 MCG/DOSE AEPB Inhale 1 puff into the lungs 2 (two) times daily. 02/08/19   Zara Council A, PA-C  furosemide (LASIX) 40 MG tablet Take 1 tablet (40 mg total) by mouth daily. 07/19/19   Lanae Boast, FNP  ipratropium (ATROVENT) 0.03 % nasal spray Place 2 sprays into both nostrils every 12 (twelve) hours. Patient not taking: Reported on 05/10/2019 02/08/19   Zara Council A, PA-C  omeprazole (PRILOSEC) 20 MG capsule Take 1 capsule (20 mg total) by mouth daily. 07/19/19   Lanae Boast, FNP  potassium chloride SA (KLOR-CON) 20 MEQ tablet Take 1 tablet (20 mEq total) by mouth daily. 02/08/19   Mc[place]owan, Larene Beach A, PA-C  promethazine (PHENERGAN) 25 MG tablet TAKE ONE TABLET BY MOUTH EVERY 6 HOURS AS NEEDED FOR NAUSEA OR VOMITING Patient not taking: Reported on 09/27/2019 08/15/19   Iloabachie, Chioma E, NP  ranitidine (ZANTAC) 150 MG tablet Take 1 tablet (150 mg total) by mouth 2 (two) times daily. 09/08/17   Virginia Crews, MD  simvastatin (ZOCOR) 10 MG tablet Take 1 tablet (10 mg total) by mouth at bedtime. 09/27/19   Lanae Boast, FNP    Allergies    Coconut oil, Amoxicillin, Penicillins, and Singulair [montelukast  sodium]  Review of Systems   Review of Systems  All other systems reviewed and are negative.   Physical Exam Updated Vital Signs BP (!) 113/24 (BP Location: Right Arm)   Pulse 84   Resp  18   Wt 73.8 kg   SpO2 100%   BMI 30.74 kg/m   Physical Exam Vitals and nursing note reviewed.  Constitutional:      General: She is not in acute distress.    Appearance: She is well-developed. She is not ill-appearing, toxic-appearing or diaphoretic.  HENT:     Head: Normocephalic and atraumatic.     Right Ear: External ear normal.     Left Ear: External ear normal.  Eyes:     Conjunctiva/sclera: Conjunctivae normal.     Pupils: Pupils are equal, round, and reactive to light.  Neck:     Trachea: Phonation normal.  Cardiovascular:     Rate and Rhythm: Normal rate and regular rhythm.  Pulmonary:     Effort: Pulmonary effort is normal.  Abdominal:     General: There is no distension.     Palpations: Abdomen is soft.  Musculoskeletal:     Cervical back: Normal range of motion and neck supple.     Comments: Weakness left arm as compared to right.  Skin:    General: Skin is warm and dry.  Neurological:     Mental Status: She is alert and oriented to person, place, and time.     Cranial Nerves: No cranial nerve deficit.     Sensory: No sensory deficit.     Motor: No abnormal muscle tone.     Comments: Moderate left arm ataxia.  No aphasia or dysarthria.  Psychiatric:        Mood and Affect: Mood normal.        Behavior: Behavior normal.        Thought Content: Thought content normal.        Judgment: Judgment normal.     ED Results / Procedures / Treatments   Labs (all labs ordered are listed, but only abnormal results are displayed) Labs Reviewed  CBC - Abnormal; Notable for the following components:      Result Value   RBC 3.76 (*)    MCV 100.8 (*)    RDW 16.6 (*)    All other components within normal limits  POCT I-STAT, CHEM 8 - Abnormal; Notable for the following  components:   Potassium 5.5 (*)    BUN 32 (*)    Creatinine, Ser 2.00 (*)    Calcium, Ion 0.88 (*)    TCO2 19 (*)    Hemoglobin 15.3 (*)    All other components within normal limits  RESP PANEL BY RT-PCR (FLU A&B, COVID) ARPGX2  DIFFERENTIAL  ETHANOL  PROTIME-INR  APTT  COMPREHENSIVE METABOLIC PANEL  RAPID URINE DRUG SCREEN, HOSP PERFORMED  URINALYSIS, ROUTINE W REFLEX MICROSCOPIC  I-STAT CHEM 8, ED  I-STAT BETA HCG BLOOD, ED ([facility], [facility], [facility] ONLY)  CBG MONITORING, ED    EKG None  Radiology CT HEAD CODE STROKE WO CONTRAST  Result Date: 11/30/2019 CLINICAL DATA:  Code stroke.  Left-sided weakness. EXAM: CT HEAD WITHOUT CONTRAST TECHNIQUE: Contiguous axial images were obtained from the base of the skull through the vertex without intravenous contrast. COMPARISON:  05/19/2013 FINDINGS: Brain: There is no evidence of an acute infarct, intracranial hemorrhage, mass, midline shift, or extra-axial fluid collection. A moderately large chronic left PCA infarct is unchanged with ex vacuo dilatation of the left occipital horn. There is also an unchanged small chronic left MCA infarct involving the left frontal lobe. Scattered hypodensities elsewhere in the cerebral white matter bilaterally are nonspecific but compatible with mild chronic small vessel ischemic  disease, stable to mildly progressed. Vascular: Calcified atherosclerosis at the skull base. No hyperdense vessel. Skull: No fracture or suspicious osseous lesion. Sinuses/Orbits: Visualized paranasal sinuses and mastoid air cells are clear. Unremarkable orbits. Other: None. ASPECTS [facility] Stroke Program Early CT Score) - Ganglionic level infarction (caudate, lentiform nuclei, internal capsule, insula, M1-M3 cortex): 7 - Supraganglionic infarction (M4-M6 cortex): 3 Total score (0-10 with 10 being normal): 10 IMPRESSION: 1. No evidence of acute intracranial abnormality. 2. ASPECTS is 10. 3. Chronic left PCA and MCA infarcts. These results were  communicated to Dr. Lorrin Goodell at 3:18 pm on 11/30/2019 by text page via the [facility] messaging system. Electronically Signed   By: Logan Bores M.D.   On: 11/30/2019 15:18    Procedures Procedures (including critical care time)  Medications Ordered in ED Medications  alteplase (ACTIVASE) 1 mg/mL infusion 66.4 mg (has no administration in time range)    Followed by  0.9 %  sodium chloride infusion (has no administration in time range)  iohexol (OMNIPAQUE) 350 MG/ML injection 100 mL (60 mLs Intravenous Contrast Given 11/30/19 1539)    ED Course  I have reviewed the triage vital signs and the nursing notes.  Pertinent labs & imaging results that were available during my care of the patient were reviewed by me and considered in my medical decision making (see chart for details).    MDM Rules/Calculators/A&P                           Patient Vitals for the past 24 hrs:  BP Pulse Resp SpO2 Weight  11/30/19 1557 (!) 154/66 (!) 59 16 100 % --  11/30/19 1508 (!) 113/24 84 18 100 % --  11/30/19 1500 -- -- -- -- 73.8 kg      Medical Decision Making:  This patient is presenting for evaluation of left arm weakness, which does require a range of treatment options, and is a complaint that involves a moderate risk of morbidity and mortality. The differential diagnoses include CVA, TIA, cervical myopathy.. I decided to review old records, and in summary Healthy female presenting with short-term onset of left arm weakness. I did not require additional historical information from anyone.  Code stroke order set initiated by neurologist when he evaluated her.  Patient seen by me with neurology, at the [facility], upon arrival.  The neurologist ordered TPA to treat for suspected acute stroke.  Critical Interventions-clinical evaluation  After These Interventions, the Patient was reevaluated and was found with suspected stroke, treated with TPA  CRITICAL CARE-no Performed by: Daleen Bo  Nursing Notes Reviewed/ Care Coordinated Applicable Imaging Reviewed Interpretation of Laboratory Data incorporated into ED treatment     Final Clinical Impression(s) / ED Diagnoses Final diagnoses:  Cerebral infarction, unspecified mechanism [facility])    Rx / [place] Orders ED Discharge Orders    None       Daleen Bo, MD 12/02/19 [phone]

## 2019-11-30 NOTE — ED Notes (Signed)
18G placed in L AC.  Patent with no infiltration.  Pt screaming, yelling at staff to take out IV, stating that it burns.

## 2019-12-01 ENCOUNTER — Inpatient Hospital Stay (HOSPITAL_COMMUNITY): Payer: Self-pay

## 2019-12-01 ENCOUNTER — Other Ambulatory Visit (HOSPITAL_COMMUNITY): Payer: Medicaid Other

## 2019-12-01 DIAGNOSIS — I639 Cerebral infarction, unspecified: Secondary | ICD-10-CM

## 2019-12-01 DIAGNOSIS — Z8673 Personal history of transient ischemic attack (TIA), and cerebral infarction without residual deficits: Secondary | ICD-10-CM

## 2019-12-01 DIAGNOSIS — E785 Hyperlipidemia, unspecified: Secondary | ICD-10-CM

## 2019-12-01 DIAGNOSIS — N1832 Chronic kidney disease, stage 3b: Secondary | ICD-10-CM

## 2019-12-01 DIAGNOSIS — I10 Essential (primary) hypertension: Secondary | ICD-10-CM

## 2019-12-01 DIAGNOSIS — I63411 Cerebral infarction due to embolism of right middle cerebral artery: Secondary | ICD-10-CM

## 2019-12-01 LAB — URINALYSIS, ROUTINE W REFLEX MICROSCOPIC
Bilirubin Urine: NEGATIVE
Glucose, UA: NEGATIVE mg/dL
Ketones, ur: NEGATIVE mg/dL
Nitrite: POSITIVE — AB
Protein, ur: 100 mg/dL — AB
Specific Gravity, Urine: 1.017 (ref 1.005–1.030)
pH: 6 (ref 5.0–8.0)

## 2019-12-01 LAB — BASIC METABOLIC PANEL
Anion gap: 11 (ref 5–15)
BUN: 22 mg/dL — ABNORMAL HIGH (ref 6–20)
CO2: 21 mmol/L — ABNORMAL LOW (ref 22–32)
Calcium: 8.7 mg/dL — ABNORMAL LOW (ref 8.9–10.3)
Chloride: 106 mmol/L (ref 98–111)
Creatinine, Ser: 1.94 mg/dL — ABNORMAL HIGH (ref 0.44–1.00)
GFR, Estimated: 32 mL/min — ABNORMAL LOW (ref >60)
Glucose, Bld: 86 mg/dL (ref 70–99)
Potassium: 3.7 mmol/L (ref 3.5–5.1)
Sodium: 138 mmol/L (ref 135–145)

## 2019-12-01 LAB — LIPID PANEL
Cholesterol: 155 mg/dL (ref 0–200)
HDL: 24 mg/dL — ABNORMAL LOW (ref >40)
LDL Cholesterol: 97 mg/dL (ref 0–99)
Total CHOL/HDL Ratio: 6.5 RATIO
Triglycerides: 169 mg/dL — ABNORMAL HIGH (ref <150)
VLDL: 34 mg/dL (ref 0–40)

## 2019-12-01 LAB — HEMOGLOBIN A1C
Hgb A1c MFr Bld: 5.7 % — ABNORMAL HIGH (ref 4.8–5.6)
Mean Plasma Glucose: 116.89 mg/dL

## 2019-12-01 LAB — CBC
HCT: 36.8 % (ref 36.0–46.0)
Hemoglobin: 12.5 g/dL (ref 12.0–15.0)
MCH: 33.1 pg (ref 26.0–34.0)
MCHC: 34 g/dL (ref 30.0–36.0)
MCV: 97.4 fL (ref 80.0–100.0)
Platelets: 243 10*3/uL (ref 150–400)
RBC: 3.78 MIL/uL — ABNORMAL LOW (ref 3.87–5.11)
RDW: 16.3 % — ABNORMAL HIGH (ref 11.5–15.5)
WBC: 8.9 10*3/uL (ref 4.0–10.5)
nRBC: 0 % (ref 0.0–0.2)

## 2019-12-01 LAB — RAPID URINE DRUG SCREEN, HOSP PERFORMED
Amphetamines: NOT DETECTED
Barbiturates: NOT DETECTED
Benzodiazepines: NOT DETECTED
Cocaine: NOT DETECTED
Opiates: NOT DETECTED
Tetrahydrocannabinol: NOT DETECTED

## 2019-12-01 MED ORDER — ALLOPURINOL 100 MG PO TABS
100.0000 mg | ORAL_TABLET | Freq: Every day | ORAL | Status: DC
  Administered 2019-12-01 – 2019-12-02 (×2): 100 mg via ORAL
  Filled 2019-12-01 (×3): qty 1

## 2019-12-01 MED ORDER — LABETALOL HCL 5 MG/ML IV SOLN: 10.0000 mg | INTRAVENOUS | Status: DC | PRN

## 2019-12-01 MED ORDER — AMLODIPINE BESYLATE 10 MG PO TABS
10.0000 mg | ORAL_TABLET | Freq: Every day | ORAL | Status: DC
  Administered 2019-12-01 – 2019-12-02 (×2): 10 mg via ORAL
  Filled 2019-12-01 (×2): qty 1

## 2019-12-01 MED ORDER — LORAZEPAM 2 MG/ML IJ SOLN
1.0000 mg | Freq: Once | INTRAMUSCULAR | Status: AC
  Administered 2019-12-01: 1 mg via INTRAVENOUS
  Filled 2019-12-01: qty 1

## 2019-12-01 MED ORDER — ASPIRIN EC 81 MG PO TBEC
81.0000 mg | DELAYED_RELEASE_TABLET | Freq: Every day | ORAL | Status: DC
  Administered 2019-12-01 – 2019-12-02 (×2): 81 mg via ORAL
  Filled 2019-12-01 (×2): qty 1

## 2019-12-01 MED ORDER — FUROSEMIDE 40 MG PO TABS
40.0000 mg | ORAL_TABLET | Freq: Every day | ORAL | Status: DC
  Administered 2019-12-01 – 2019-12-02 (×2): 40 mg via ORAL
  Filled 2019-12-01 (×2): qty 1

## 2019-12-01 MED ORDER — CHLORHEXIDINE GLUCONATE CLOTH 2 % EX PADS
6.0000 | MEDICATED_PAD | Freq: Every day | CUTANEOUS | Status: DC
  Administered 2019-12-01 – 2019-12-02 (×2): 6 via TOPICAL

## 2019-12-01 MED ORDER — CLOPIDOGREL BISULFATE 75 MG PO TABS
75.0000 mg | ORAL_TABLET | Freq: Every day | ORAL | Status: DC
  Administered 2019-12-01 – 2019-12-02 (×2): 75 mg via ORAL
  Filled 2019-12-01 (×2): qty 1

## 2019-12-01 MED ORDER — PANTOPRAZOLE SODIUM 40 MG PO TBEC
40.0000 mg | DELAYED_RELEASE_TABLET | Freq: Every day | ORAL | Status: DC
  Administered 2019-12-01 – 2019-12-02 (×2): 40 mg via ORAL
  Filled 2019-12-01 (×2): qty 1

## 2019-12-01 MED ORDER — CALCITRIOL 0.25 MCG PO CAPS
0.2500 ug | ORAL_CAPSULE | Freq: Every day | ORAL | Status: DC
  Administered 2019-12-01 – 2019-12-02 (×2): 0.25 ug via ORAL
  Filled 2019-12-01 (×3): qty 1

## 2019-12-01 MED ORDER — SIMVASTATIN 20 MG PO TABS
40.0000 mg | ORAL_TABLET | Freq: Every day | ORAL | Status: DC
  Administered 2019-12-01 – 2019-12-02 (×2): 40 mg via ORAL
  Filled 2019-12-01 (×2): qty 2

## 2019-12-01 NOTE — Progress Notes (Signed)
Spoke with dr Leonel Ramsay regarding MRI to be done 11/27, was told to wait until the 24 hour post TPA mark to complete.  MRI made aware and will be done around 1530 11/27.

## 2019-12-01 NOTE — Evaluation (Signed)
Physical Therapy Evaluation Patient Details Name: Gabrielle Schultz MRN: [id number] DOB: [birth year] Today's Date: 12/01/2019   History of Present Illness  This 49 y.o. female admitted with sudden Lt sided weakness. Received tPA.  Head CT showed no evidence of acute intracranial abnormality; chronic Lt PCA and MCA infarcts.  CTA negative for LVO.  PMH includes:  h/o syncope, h/o CVA, renal insufficiency, migraines, MI, HTN, GOUT, coliltis, CKD, CTS, asthma   Clinical Impression  Pt in bed upon arrival of PT, agreeable to evaluation at this time. Prior to admission the pt was independent with all mobility and ADLs without use of AD. 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 The pt now presents with limitations in functional mobility, dynamic stability, and cognitive deficits that impact problem solving and safety with mobility due to above dx, and will continue to benefit from skilled PT to address these deficits. The pt was able to complete initial transfers and bed mobility without assist, but benefits from supervision and minG/minA with addition of perturbations such as opening doors or carrying objects as she will need to to care for her young child. The pt will benefit from continued PT acutely as well as following d/c to facilitate return to prior level of mobility and independence.      Follow Up Recommendations Home health PT;Supervision for mobility/OOB    Equipment Recommendations  None recommended by PT    Recommendations for Other Services       Precautions / Restrictions Precautions Precautions: Fall Restrictions Weight Bearing Restrictions: No      Mobility  Bed Mobility Overal bed mobility: Needs Assistance Bed Mobility: Supine to Sit;Sit to Supine     Supine to sit: Min guard Sit to supine: Min guard   General bed mobility comments: min guard assist for  safety     Transfers Overall transfer level: Needs assistance Equipment used: None Transfers: Sit to/from [facility] Sit to Stand: Supervision Stand pivot transfers: Supervision          Ambulation/Gait Ambulation/Gait assistance: Min guard Gait Distance (Feet): 400 Feet Assistive device: None Gait Pattern/deviations: Step-through pattern;Decreased stride length Gait velocity: 0.67 m/s Gait velocity interpretation: <1.8 ft/sec, indicate of risk for recurrent falls General Gait Details: decreased stride length but no LOB with gait. Pt able to manage minor perturbations without LOB such as opening heavy door  Stairs Stairs: Yes Stairs assistance: Min guard Stair Management: One rail Right;No rails;Step to pattern;Forwards Number of Stairs: 1 (x4) General stair comments: pt completed x2 with single UE support on rail, then x2 without UE support to mimic home environment. increased time and effort  Wheelchair Mobility    Modified Rankin (Stroke Patients Only) Modified Rankin (Stroke Patients Only) Pre-Morbid Rankin Score: No significant disability Modified Rankin: Moderately severe disability     Balance Overall balance assessment: Mild deficits observed, not formally tested                                           Pertinent Vitals/Pain Pain Assessment: No/denies pain    Home Living Family/patient expects to be discharged to:: Private residence Living Arrangements: Spouse/significant other;Children (husband, 50 yo son, 55yo daughter) Available Help at Discharge: Family;Available 24 hours/day Type of Home: House Home Access: Stairs to enter  Entrance Stairs-Rails: None Entrance Stairs-Number of Steps: 1 Home Layout: One level Home Equipment: Clinical cytogeneticist - 4 wheels;Cane - quad Additional Comments: Pt reports she lives with her spouse, who works during the day, her 48 y.o. son, and 26 y.o. daughter.  She reports her son is  available to assist as needed at discharge     Prior Function Level of Independence: Needs assistance   Gait / Transfers Assistance Needed: independent except for gout flare up, then uses cane or RW due to pain  ADL's / Homemaking Assistance Needed: independent with ADLs, no driving, working.  She reports she is independent with medication management, and manages medications for her son and spouse.  She reports she manages the household finances, and does the cooking and cleaning         Hand Dominance   Dominant Hand: Right    Extremity/Trunk Assessment   Upper Extremity Assessment Upper Extremity Assessment: Defer to OT evaluation LUE Deficits / Details: strength grossly 4/5  LUE Sensation:  (LT intact, but pt reports Lt hand feels funny ) LUE Coordination: decreased fine motor    Lower Extremity Assessment Lower Extremity Assessment: Generalized weakness (pt able to use BLE functionally, step up with BLE)    Cervical / Trunk Assessment Cervical / Trunk Assessment: Normal  Communication   Communication: No difficulties  Cognition Arousal/Alertness: Awake/alert Behavior During Therapy: WFL for tasks assessed/performed Overall Cognitive Status: Impaired/Different from baseline Area of Impairment: Attention;Following commands;Safety/judgement;Awareness;Problem solving                   Current Attention Level: Alternating   Following Commands: Follows one step commands consistently;Follows multi-step commands inconsistently Safety/Judgement: Decreased awareness of deficits Awareness: Emergent Problem Solving: Slow processing;Requires verbal cues;Requires tactile cues General Comments: Pt required min cues to follow a 4 step command. She was able to perform simple math.  She required mod cues for simple path finding, and demonstrated difficulty with visual memory.  She also required mod A to problem solve through her errors       General Comments General comments  (skin integrity, edema, etc.): VSS, intermittent HR elevation to 140s, mostly in 110's-120's with gait    Exercises     Assessment/Plan    PT Assessment Patient needs continued PT services  PT Problem List Decreased strength;Decreased balance;Decreased mobility;Decreased cognition;Decreased safety awareness       PT Treatment Interventions DME instruction;Gait training;Stair training;Functional mobility training;Therapeutic activities;Therapeutic exercise;Balance training;Patient/family education    PT Goals (Current goals can be found in the Care Plan section)  Acute Rehab PT Goals Patient Stated Goal: To get home  PT Goal Formulation: With patient Time For Goal Achievement: 12/15/19 Potential to Achieve Goals: Good    Frequency Min 4X/week   Barriers to discharge   pt is primary caregiver for husband, 84 yo son, and 64 yo child at home. manages everyone's medications    Co-evaluation PT/OT/SLP Co-Evaluation/Treatment: Yes Reason for Co-Treatment: Necessary to address cognition/behavior during functional activity;For patient/therapist safety PT goals addressed during session: Mobility/safety with mobility;Balance OT goals addressed during session: ADL's and self-care       AM-PAC PT "6 Clicks" Mobility  Outcome Measure Help needed turning from your back to your side while in a flat bed without using bedrails?: None Help needed moving from lying on your back to sitting on the side of a flat bed without using bedrails?: None Help needed moving to and from a bed to a chair (including a wheelchair)?: A Little  Help needed standing up from a chair using your arms (e.g., wheelchair or bedside chair)?: A Little Help needed to walk in hospital room?: A Little Help needed climbing 3-5 steps with a railing? : A Little 6 Click Score: 20    End of Session Equipment Utilized During Treatment: Gait belt Activity Tolerance: Patient tolerated treatment well Patient left: in bed;with  call bell/phone within reach;with bed alarm set Nurse Communication: Mobility status PT Visit Diagnosis: Other abnormalities of gait and mobility (R26.89)    Time: 8421-0312 PT Time Calculation (min) (ACUTE ONLY): 44 min   Charges:   PT Evaluation $PT Eval Low Complexity: 1 Low          Karma Ganja, PT, DPT   Acute Rehabilitation Department Pager #: [phone]  Otho Bellows 12/01/2019, 2:38 PM

## 2019-12-01 NOTE — Progress Notes (Signed)
STROKE TEAM PROGRESS NOTE   INTERVAL HISTORY Her husband is on the phone. Pt sitting in bed, no distress. Still has left arm mild weakness with dexterity difficulty. Pt stated that she had stroke in 2006 and 2014 left with right hemianopia and mild right hand weakness. She has family hx of heart disease. Her sister died at age of 45 due to CHF, ESRD on dialysis. Brother died not long ago due to alcohol, CHF and strokes. Mom died of CHF last year and dad died of CHF in 07-12-2019. She also stated some irregular heart beat from time to time. She stated that she was told that her strokes in the past were due to stress.   OBJECTIVE Vitals:   12/01/19 0500 12/01/19 0600 12/01/19 0700 12/01/19 0800  BP: (!) 155/64 (!) 145/55 (!) 161/63   Pulse: 62 (!) 55 62 78  Resp:      Temp:      TempSrc:      SpO2: 97% 95% 94% 97%  Weight:      Height:        CBC:  Recent Labs  Lab 11/30/19 1502 11/30/19 1502 11/30/19 1507 12/01/19 0223  WBC 9.9  --   --  8.9  NEUTROABS 5.9  --   --   --   HGB 12.3   < > 15.3* 12.5  HCT 37.9   < > 45.0 36.8  MCV 100.8*  --   --  97.4  PLT 232  --   --  243   < > = values in this interval not displayed.    Basic Metabolic Panel:  Recent Labs  Lab 11/30/19 1502 11/30/19 1502 11/30/19 1507 12/01/19 0223  NA 137   < > 136 138  K 5.0   < > 5.5* 3.7  CL 104   < > 110 106  CO2 20*  --   --  21*  GLUCOSE 80   < > 84 86  BUN 22*   < > 32* 22*  CREATININE 1.92*   < > 2.00* 1.94*  CALCIUM 9.2  --   --  8.7*   < > = values in this interval not displayed.    Lipid Panel:     Component Value Date/Time   CHOL 155 12/01/2019 0223   CHOL 177 09/20/2019 1912   CHOL 123 10/20/2012 0226   TRIG 169 (H) 12/01/2019 0223   TRIG 85 10/20/2012 0226   HDL 24 (L) 12/01/2019 0223   HDL 24 (L) 09/20/2019 1912   HDL 20 (L) 10/20/2012 0226   CHOLHDL 6.5 12/01/2019 0223   VLDL 34 12/01/2019 0223   VLDL 17 10/20/2012 0226   LDLCALC 97 12/01/2019 0223   LDLCALC 116 (H)  09/20/2019 1912   LDLCALC 86 10/20/2012 0226   HgbA1c:  Lab Results  Component Value Date   HGBA1C 5.7 (H) 12/01/2019   Urine Drug Screen:     Component Value Date/Time   LABOPIA NONE DETECTED 12/01/2019 0134   COCAINSCRNUR NONE DETECTED 12/01/2019 0134   COCAINSCRNUR NEGATIVE 07/19/2011 1605   LABBENZ NONE DETECTED 12/01/2019 0134   AMPHETMU NONE DETECTED 12/01/2019 0134   THCU NONE DETECTED 12/01/2019 0134   LABBARB NONE DETECTED 12/01/2019 0134    Alcohol Level     Component Value Date/Time   ETH <10 11/30/2019 1502    IMAGING  CT HEAD CODE STROKE WO CONTRAST 11/30/2019 IMPRESSION:  1. No evidence of acute intracranial abnormality.  2. ASPECTS is 10.  3. Chronic left PCA and MCA infarcts.   CT ANGIO HEAD CODE STROKE CT ANGIO NECK CODE STROKE 11/30/2019 IMPRESSION:  No evidence of large vessel occlusion or hemodynamically significant proximal stenosis in the head or neck.   MR BRAIN WO CONTRAST  Result Date: 12/01/2019 CLINICAL DATA:  Acute left-sided weakness. EXAM: MRI HEAD WITHOUT CONTRAST TECHNIQUE: Multiplanar, multiecho pulse sequences of the brain and surrounding structures were obtained without intravenous contrast. COMPARISON:  Head CT and CTA 11/30/2019 and MRI 12/29/2012 FINDINGS: Brain: There is a small acute cortical and subcortical infarct in the posterior right frontal lobe with involvement of the precentral gyrus. A moderately large chronic left PCA infarct is again noted with associated chronic blood products and ex vacuo dilatation of the left lateral ventricle. There are also small chronic cortical infarcts in the left frontal and left parietal lobes with a small amount of associated chronic blood products in the left frontal operculum. T2 hyperintensities elsewhere in the cerebral white matter bilaterally have mildly progressed from 2014 and are nonspecific but compatible with moderately age advanced chronic small vessel ischemic disease. There are  chronic lacunar infarcts in the left caudate nucleus and cerebellum. Cerebral atrophy is moderately advanced for age. Chronic microhemorrhages are noted in the right parietal lobe and right cerebellum. Vascular: Major intracranial vascular flow voids are preserved. Skull and upper cervical spine: Unremarkable bone marrow signal. Sinuses/Orbits: Unremarkable orbits. At most trace mastoid fluid bilaterally. Clear paranasal sinuses. Other: None. IMPRESSION: 1. Small acute posterior right frontal lobe infarct. 2. Age advanced chronic ischemia with multiple old infarcts as above. Electronically Signed   By: Logan Bores M.D.   On: 12/01/2019 15:19   VAS [place] LOWER EXTREMITY VENOUS (DVT)  Result Date: 12/01/2019  Lower Venous DVT Study Indications: Stroke.  Comparison Study: No prior study Performing Technologist: Sharion Dove RVS  Examination Guidelines: A complete evaluation includes B-mode imaging, spectral Doppler, color Doppler, and power Doppler as needed of all accessible portions of each vessel. Bilateral testing is considered an integral part of a complete examination. Limited examinations for reoccurring indications may be performed as noted. The reflux portion of the exam is performed with the patient in reverse Trendelenburg.  +---------+---------------+---------+-----------+----------+--------------+ RIGHT    CompressibilityPhasicitySpontaneityPropertiesThrombus Aging +---------+---------------+---------+-----------+----------+--------------+ CFV      Full           Yes      Yes                                 +---------+---------------+---------+-----------+----------+--------------+ SFJ      Full                                                        +---------+---------------+---------+-----------+----------+--------------+ FV Prox  Full                                                        +---------+---------------+---------+-----------+----------+--------------+ FV Mid    Full                                                        +---------+---------------+---------+-----------+----------+--------------+  FV DistalFull                                                        +---------+---------------+---------+-----------+----------+--------------+ PFV      Full                                                        +---------+---------------+---------+-----------+----------+--------------+ POP      Full           Yes      Yes                                 +---------+---------------+---------+-----------+----------+--------------+ PTV      Full                                                        +---------+---------------+---------+-----------+----------+--------------+ PERO     Full                                                        +---------+---------------+---------+-----------+----------+--------------+   +---------+---------------+---------+-----------+----------+--------------+ LEFT     CompressibilityPhasicitySpontaneityPropertiesThrombus Aging +---------+---------------+---------+-----------+----------+--------------+ CFV      Full           Yes      Yes                                 +---------+---------------+---------+-----------+----------+--------------+ SFJ      Full                                                        +---------+---------------+---------+-----------+----------+--------------+ FV Prox  Full                                                        +---------+---------------+---------+-----------+----------+--------------+ FV Mid   Full                                                        +---------+---------------+---------+-----------+----------+--------------+ FV DistalFull                                                        +---------+---------------+---------+-----------+----------+--------------+   PFV      Full                                                         +---------+---------------+---------+-----------+----------+--------------+ POP      Full           Yes      Yes                                 +---------+---------------+---------+-----------+----------+--------------+ PTV      Full                                                        +---------+---------------+---------+-----------+----------+--------------+ PERO     Full                                                        +---------+---------------+---------+-----------+----------+--------------+     *See table(s) above for measurements and observations. Electronically signed by Jamelle Haring on 12/01/2019 at 1:51:47 PM.    Final     Transthoracic Echocardiogram  Pending  ECG - SR rate 68 BPM. (See cardiology reading for complete details)  PHYSICAL EXAM  Temp:  [97.8 F (36.6 C)-98.6 F (37 C)] 98.6 F (37 C) (11/27 0400) Pulse Rate:  [55-87] 77 (11/27 1000) Resp:  [11-18] 14 (11/27 1000) BP: (113-174)/(24-76) 159/76 (11/27 1000) SpO2:  [94 %-100 %] 99 % (11/27 1000) Weight:  [72.1 kg-73.8 kg] 72.1 kg (11/26 1734)  General - Well nourished, well developed, in no apparent distress.  Ophthalmologic - fundi not visualized due to noncooperation.  Cardiovascular - Regular rhythm and rate.  Mental Status -  Level of arousal and orientation to time, place, and person were intact. Language including expression, naming, repetition, comprehension was assessed and found intact. Fund of Knowledge was assessed and was intact.  Cranial Nerves II - XII - II - chronic right hemianopia. III, IV, VI - Extraocular movements intact. V - Facial sensation intact bilaterally. VII - chronic slight right nasolabial fold flattening. VIII - Hearing & vestibular intact bilaterally. X - Palate elevates symmetrically. XI - Chin turning & shoulder shrug intact bilaterally. XII - Tongue protrusion intact.  Motor Strength - The patient's strength was normal  in all extremities except left UE 4/5 deltoid, bicep, tricep and finger grip with finger dexterities.  Bulk was normal and fasciculations were absent.   Motor Tone - Muscle tone was assessed at the neck and appendages and was normal.  Reflexes - The patient's reflexes were symmetrical in all extremities and she had no pathological reflexes.  Sensory - Light touch, temperature/pinprick were assessed and were symmetrical.    Coordination - The patient had normal movements in the hands with no ataxia or dysmetria.  Tremor was absent.  Gait and Station - deferred.   ASSESSMENT/PLAN Gabrielle Schultz is a 49 y.o. 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 The patient received IV t-PA Fraiday 11/30/19 at 1530.    Right frontal cortical small stroke s/p tPA, embolic pattern, etiology unclear  CT Head - No evidence of acute intracranial abnormality. ASPECTS is 10. Chronic left PCA and MCA infarcts.    CTA H&N - No evidence of large vessel occlusion or hemodynamically significant proximal stenosis in the head or neck.   MRI head - Small acute posterior right frontal lobe infarct.  2D Echo - pending  LE venous doppler no DVT  Given young age with recurrent strokes, will consider TEE  May consider loop recorder to rule out afib given recurrent embolic strokes  [employer] Virus 2 - negative  LDL - 97  HgbA1c - 5.7  UDS - negative  Repeat parts of the hypercoagulable work up which was abnormal in 2016 - pending   VTE prophylaxis - SCDs  aspirin 81 mg daily prior to admission, now on ASA and palvix DAPT 24h post tPA  Patient will be counseled to be compliant with her antithrombotic medications  Ongoing aggressive stroke risk factor management  Therapy  recommendations:  pending  Disposition:  Pending  Recurrent strokes  Per patient, she had a for stroke at age of 67  Second left PCA stroke in 2014 with right hemianopia, right side weakness.  Hypercoagulable work-up in 2016 showed negative ANA and APS antibodies, however positive for mildly low protein C level 47, mildly high homocystine 20, slightly low Antithrombin III  On home aspirin 81  Unclear etiology of the strokes.  Family history of CHF, strokes  Current admission for right frontal cortical small infarct  TEE pending  Hypertension  Home BP meds: Norvasc ; catapres; lasix  Current BP meds: norvasc and lasix  Stable on the high end . BP goal < 180/105 . Long-term BP goal normotensive  Hyperlipidemia  Home Lipid lowering medication: Zocor 10 mg daily  LDL 97, goal < 70  Current lipid lowering medication: increase zocor to 40 mg daily  Continue statin at discharge  Tobacco abuse  Current smoker  Smoking cessation counseling provided  Nicotine patch provided  Pt is willing to quit  Other Stroke Risk Factors  Previous ETOH use  Obesity, Body mass index is 30.03 kg/m., recommend weight loss, diet and exercise as appropriate   Coronary artery disease  Migraines  Other Active Problems  Code status - Full code CKD - stage 3b - creatinine - 1.92->2.00->1.94  Hyperkalemia - 5.0->5.5->3.7   Hospital day # 1  This patient is critically ill due to stroke status post TPA, recurrent strokes and at significant risk of neurological worsening, death form recurrent strokes, hemorrhage from TPA. This patient's care requires constant monitoring of vital signs, hemodynamics, respiratory and cardiac monitoring, review of multiple databases, neurological assessment, discussion with family, other specialists and medical decision making of high complexity. I spent 30 minutes of neurocritical care time in the care of this patient.  Rosalin Hawking, MD PhD Stroke  Neurology 12/01/2019 4:43 PM    To contact Stroke Continuity provider, please refer to [email or web address] After hours, contact General Neurology

## 2019-12-01 NOTE — Evaluation (Addendum)
Occupational Therapy Evaluation Patient Details Name: Gabrielle Schultz MRN: [id number] DOB: [birth year] Today's Date: 12/01/2019    History of Present Illness This 49 y.o. female admitted with sudden Lt sided weakness. Received tPA.  Head CT showed no evidence of acute intracranial abnormality; chronic Lt PCA and MCA infarcts.  CTA negative for LVO.  PMH includes:  h/o syncope, h/o CVA, renal insufficiency, migraines, MI, HTN, GOUT, coliltis, CKD, CTS, asthma    Clinical Impression   Pt admitted with above. She demonstrates the below listed deficits and will benefit from continued OT to maximize safety and independence with BADLs.  Pt presents to OT with mild Lt UE weakness, mild balance deficits, impaired cognition including deficits with sequencing, problem solving, and memory, and h/o Rt HH.  She currently is able to perform ADLs with min guard assist - supervision, but does demonstrates deficits with higher level cognitive skills.  She reports she lives with her spouse, who works, her 59 y.o. son with intellectual deficis, and 47 y.o. daughter.  She reports she was fully independent PTA.  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 She does not drive.  Recommend HHOT.       Follow Up Recommendations  Home health OT;Supervision/Assistance - 24 hour (initially as she is caregiver for 91 y.o. daughter )    Equipment Recommendations  Tub/shower seat    Recommendations for Other Services       Precautions / Restrictions Precautions Precautions: Fall Restrictions Weight Bearing Restrictions: No      Mobility Bed Mobility Overal bed mobility: Needs Assistance Bed Mobility: Supine to Sit;Sit to Supine     Supine to sit: Min guard Sit to supine: Min guard   General bed mobility comments: min guard assist for safety     Transfers Overall transfer level: Needs assistance Equipment used: None Transfers: Sit to/from  [facility] Sit to Stand: Supervision Stand pivot transfers: Supervision            Balance Overall balance assessment: Mild deficits observed, not formally tested                                         ADL either performed or assessed with clinical judgement   ADL Overall ADL's : Needs assistance/impaired Eating/Feeding: Independent   Grooming: Wash/dry hands;Wash/dry face;Oral care;Brushing hair;Supervision/safety;Standing   Upper Body Bathing: Set up;Sitting   Lower Body Bathing: Set up;Supervison/ safety;Sit to/from stand   Upper Body Dressing : Set up;Sitting   Lower Body Dressing: Supervision/safety;Sit to/from stand   Toilet Transfer: Supervision/safety;Ambulation;Comfort height toilet   Toileting- Clothing Manipulation and Hygiene: Supervision/safety;Sit to/from stand       Functional mobility during ADLs: Supervision/safety       Vision Baseline Vision/History: Wears glasses Wears Glasses: At all times Patient Visual Report: No change from baseline Vision Assessment?: Yes Eye Alignment: Within Functional Limits Ocular Range of Motion: Within Functional Limits Alignment/Gaze Preference: Within Defined Limits Tracking/Visual Pursuits: Other (comment) (Pt demonstrates difficulty following commands for assessment) Visual Fields: Right homonymous hemianopsia Additional Comments: 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      Perception Perception Perception Tested?: Yes   Praxis Praxis Praxis tested?: Within functional limits    Pertinent Vitals/Pain Pain Assessment: No/denies pain     Hand Dominance Right   Extremity/Trunk Assessment Upper  Extremity Assessment Upper Extremity Assessment: LUE deficits/detail LUE Deficits / Details: strength grossly 4/5  LUE Sensation:  (LT intact, but pt reports Lt hand feels funny ) LUE Coordination: decreased fine motor       Cervical / Trunk Assessment Cervical / Trunk  Assessment: Normal   Communication Communication Communication: No difficulties   Cognition Arousal/Alertness: Awake/alert Behavior During Therapy: WFL for tasks assessed/performed Overall Cognitive Status: Impaired/Different from baseline Area of Impairment: Attention;Following commands;Safety/judgement;Awareness;Problem solving                   Current Attention Level: Alternating   Following Commands: Follows one step commands consistently;Follows multi-step commands inconsistently Safety/Judgement: Decreased awareness of deficits Awareness: Emergent Problem Solving: Slow processing;Requires verbal cues;Requires tactile cues General Comments: Pt required min cues to follow a 4 step command. She was able to perform simple math.  She required mod cues for simple path finding, and demonstrated difficulty with visual memory.  She also required mod A to problem solve through her errors    General Comments  VSS    Exercises     Shoulder Instructions      Home Living Family/patient expects to be discharged to:: Private residence Living Arrangements: Spouse/significant other;Children (husband, son (15) and 60 yo) Available Help at Discharge: Family;Available 24 hours/day Type of Home: House Home Access: Stairs to enter [employer] of Steps: 1 Entrance Stairs-Rails: None Home Layout: One level     Bathroom Shower/Tub: Tub/shower unit;Curtain   Bathroom Toilet: Handicapped height     Home Equipment: Clinical cytogeneticist - 4 wheels;Cane - quad   Additional Comments: Pt reports she lives with her spouse, who works during the day, her 54 y.o. son, and 35 y.o. daughter.  She reports her son is available to assist as needed at discharge       Prior Functioning/Environment Level of Independence: Needs assistance  Gait / Transfers Assistance Needed: independent except for gout flare up, then uses cane or RW due to pain ADL's / Homemaking Assistance Needed:  independent with ADLs, no driving, working.  She reports she is independent with medication management, and manages medications for her son and spouse.  She reports she manages the household finances, and does the cooking and cleaning             OT Problem List: Decreased strength;Decreased activity tolerance;Impaired vision/perception;Decreased coordination;Decreased cognition;Decreased safety awareness;Impaired UE functional use      OT Treatment/Interventions: Self-care/ADL training;Neuromuscular education;Energy conservation;DME and/or AE instruction;Modalities;Cognitive remediation/compensation;Visual/perceptual remediation/compensation;Patient/family education;Balance training    OT Goals(Current goals can be found in the care plan section) Acute Rehab OT Goals Patient Stated Goal: To get home  OT Goal Formulation: With patient Time For Goal Achievement: 12/15/19 Potential to Achieve Goals: Good ADL Goals Additional ADL Goal #1: Pt will be able to perform simulated medication management task with min cues Additional ADL Goal #2: Pt will perform mod complex path findind with no more than 2 cues  OT Frequency: Min 2X/week   Barriers to D/C:            Co-evaluation PT/OT/SLP Co-Evaluation/Treatment: Yes Reason for Co-Treatment: For patient/therapist safety;To address functional/ADL transfers   OT goals addressed during session: ADL's and self-care      AM-PAC OT "6 Clicks" Daily Activity     Outcome Measure Help from another person eating meals?: None Help from another person taking care of personal grooming?: A Little Help from another person toileting, which includes using toliet, bedpan, or urinal?: A Little Help  from another person bathing (including washing, rinsing, drying)?: A Little Help from another person to put on and taking off regular upper body clothing?: A Little Help from another person to put on and taking off regular lower body clothing?: A Little 6  Click Score: 19   End of Session Nurse Communication: Mobility status  Activity Tolerance: Patient tolerated treatment well Patient left: in bed;with call bell/phone within reach;with bed alarm set  OT Visit Diagnosis: Unsteadiness on feet (R26.81);Cognitive communication deficit (R41.841) Symptoms and signs involving cognitive functions: Cerebral infarction                Time: 0350-0938 OT Time Calculation (min): 46 min Charges:  OT General Charges $OT Visit: 1 Visit OT Evaluation $OT Eval Moderate Complexity: 1 Mod OT Treatments $Therapeutic Activity: 8-22 mins  Nilsa Nutting., OTR/L Acute Rehabilitation Services Pager [phone] Office [phone]   Lucille Passy M 12/01/2019, 12:43 PM

## 2019-12-01 NOTE — Progress Notes (Signed)
VASCULAR LAB    Bilateral lower extremity venous duplex has been performed.  See CV proc for preliminary results.   Braylyn Eye, RVT 12/01/2019, 1:43 PM

## 2019-12-02 DIAGNOSIS — N179 Acute kidney failure, unspecified: Secondary | ICD-10-CM

## 2019-12-02 DIAGNOSIS — N1831 Chronic kidney disease, stage 3a: Secondary | ICD-10-CM

## 2019-12-02 LAB — BASIC METABOLIC PANEL
Anion gap: 14 (ref 5–15)
BUN: 22 mg/dL — ABNORMAL HIGH (ref 6–20)
CO2: 22 mmol/L (ref 22–32)
Calcium: 9.6 mg/dL (ref 8.9–10.3)
Chloride: 105 mmol/L (ref 98–111)
Creatinine, Ser: 2.68 mg/dL — ABNORMAL HIGH (ref 0.44–1.00)
GFR, Estimated: 21 mL/min — ABNORMAL LOW (ref >60)
Glucose, Bld: 95 mg/dL (ref 70–99)
Potassium: 3.6 mmol/L (ref 3.5–5.1)
Sodium: 141 mmol/L (ref 135–145)

## 2019-12-02 LAB — CBC
HCT: 39.7 % (ref 36.0–46.0)
Hemoglobin: 13.4 g/dL (ref 12.0–15.0)
MCH: 32.5 pg (ref 26.0–34.0)
MCHC: 33.8 g/dL (ref 30.0–36.0)
MCV: 96.4 fL (ref 80.0–100.0)
Platelets: 295 10*3/uL (ref 150–400)
RBC: 4.12 MIL/uL (ref 3.87–5.11)
RDW: 16.3 % — ABNORMAL HIGH (ref 11.5–15.5)
WBC: 10.3 10*3/uL (ref 4.0–10.5)
nRBC: 0 % (ref 0.0–0.2)

## 2019-12-02 LAB — ANTITHROMBIN III: AntiThromb III Func: 121 % — ABNORMAL HIGH (ref 75–120)

## 2019-12-02 MED ORDER — MOMETASONE FURO-FORMOTEROL FUM 100-5 MCG/ACT IN AERO
2.0000 | INHALATION_SPRAY | Freq: Two times a day (BID) | RESPIRATORY_TRACT | Status: DC
  Administered 2019-12-02 – 2019-12-03 (×2): 2 via RESPIRATORY_TRACT
  Filled 2019-12-02: qty 8.8

## 2019-12-02 MED ORDER — CLONIDINE HCL 0.1 MG PO TABS
0.2000 mg | ORAL_TABLET | Freq: Three times a day (TID) | ORAL | Status: DC
  Administered 2019-12-02 – 2019-12-03 (×4): 0.2 mg via ORAL
  Filled 2019-12-02: qty 1
  Filled 2019-12-02: qty 2
  Filled 2019-12-02 (×2): qty 1

## 2019-12-02 MED ORDER — SODIUM CHLORIDE 0.9 % IV SOLN: INTRAVENOUS | Status: DC

## 2019-12-02 MED ORDER — CETIRIZINE HCL 10 MG PO TABS
10.0000 mg | ORAL_TABLET | Freq: Every day | ORAL | Status: DC
  Administered 2019-12-02 – 2019-12-03 (×2): 10 mg via ORAL
  Filled 2019-12-02 (×3): qty 1

## 2019-12-02 MED ORDER — NON FORMULARY: 10.0000 mg | Freq: Every day | Status: DC

## 2019-12-02 MED ORDER — FLUTICASONE PROPIONATE 50 MCG/ACT NA SUSP
1.0000 | Freq: Two times a day (BID) | NASAL | Status: DC
  Administered 2019-12-02: 1 via NASAL
  Filled 2019-12-02: qty 16

## 2019-12-02 MED ORDER — LOPERAMIDE HCL 2 MG PO CAPS
2.0000 mg | ORAL_CAPSULE | Freq: Four times a day (QID) | ORAL | Status: DC | PRN
  Filled 2019-12-02: qty 1

## 2019-12-02 NOTE — H&P (View-Only) (Signed)
STROKE TEAM PROGRESS NOTE   INTERVAL HISTORY No family at bedside.  Patient sitting in bed, asking for her clonidine.  Otherwise, neuro stable, still has left hand dexterity difficulty.  Pending TEE tomorrow.  OBJECTIVE Vitals:   12/02/19 1000 12/02/19 1100 12/02/19 1200 12/02/19 1300  BP: (!) 171/65 (!) 171/91 (!) 172/80 (!) 172/88  Pulse: 99 (!) 113 (!) 124 (!) 135  Resp: (!) 21 16 (!) 21 (!) 24  Temp:      TempSrc:      SpO2: 98% 98% 100% 99%  Weight:      Height:        CBC:  Recent Labs  Lab 11/30/19 1502 11/30/19 1507 12/01/19 0223 12/02/19 0053  WBC 9.9   < > 8.9 10.3  NEUTROABS 5.9  --   --   --   HGB 12.3   < > 12.5 13.4  HCT 37.9   < > 36.8 39.7  MCV 100.8*   < > 97.4 96.4  PLT 232   < > 243 295   < > = values in this interval not displayed.    Basic Metabolic Panel:  Recent Labs  Lab 12/01/19 0223 12/02/19 0053  NA 138 141  K 3.7 3.6  CL 106 105  CO2 21* 22  GLUCOSE 86 95  BUN 22* 22*  CREATININE 1.94* 2.68*  CALCIUM 8.7* 9.6    Lipid Panel:     Component Value Date/Time   CHOL 155 12/01/2019 0223   CHOL 177 09/20/2019 1912   CHOL 123 10/20/2012 0226   TRIG 169 (H) 12/01/2019 0223   TRIG 85 10/20/2012 0226   HDL 24 (L) 12/01/2019 0223   HDL 24 (L) 09/20/2019 1912   HDL 20 (L) 10/20/2012 0226   CHOLHDL 6.5 12/01/2019 0223   VLDL 34 12/01/2019 0223   VLDL 17 10/20/2012 0226   LDLCALC 97 12/01/2019 0223   LDLCALC 116 (H) 09/20/2019 1912   LDLCALC 86 10/20/2012 0226   HgbA1c:  Lab Results  Component Value Date   HGBA1C 5.7 (H) 12/01/2019   Urine Drug Screen:     Component Value Date/Time   LABOPIA NONE DETECTED 12/01/2019 0134   COCAINSCRNUR NONE DETECTED 12/01/2019 0134   COCAINSCRNUR NEGATIVE 07/19/2011 1605   LABBENZ NONE DETECTED 12/01/2019 0134   AMPHETMU NONE DETECTED 12/01/2019 0134   THCU NONE DETECTED 12/01/2019 0134   LABBARB NONE DETECTED 12/01/2019 0134    Alcohol Level     Component Value Date/Time   ETH <10  11/30/2019 1502    IMAGING  CT HEAD CODE STROKE WO CONTRAST 11/30/2019 IMPRESSION:  1. No evidence of acute intracranial abnormality.  2. ASPECTS is 10.  3. Chronic left PCA and MCA infarcts.   CT ANGIO HEAD CODE STROKE CT ANGIO NECK CODE STROKE 11/30/2019 IMPRESSION:  No evidence of large vessel occlusion or hemodynamically significant proximal stenosis in the head or neck.   MR BRAIN WO CONTRAST 12/01/2019 IMPRESSION:  1. Small acute posterior right frontal lobe infarct.  2. Age advanced chronic ischemia with multiple old infarcts as above.   Transthoracic Echocardiogram  Pending  ECG - SR rate 68 BPM. (See cardiology reading for complete details)  PHYSICAL EXAM  Temp:  [98.4 F (36.9 C)-98.9 F (37.2 C)] 98.6 F (37 C) (11/28 0844) Pulse Rate:  [73-135] 135 (11/28 1300) Resp:  [13-31] 24 (11/28 1300) BP: (149-179)/(63-103) 172/88 (11/28 1300) SpO2:  [94 %-100 %] 99 % (11/28 1300)  General - Well nourished, well developed,  in no apparent distress.  Ophthalmologic - fundi not visualized due to noncooperation.  Cardiovascular - Regular rhythm and rate.  Mental Status -  Level of arousal and orientation to time, place, and person were intact. Language including expression, naming, repetition, comprehension was assessed and found intact. Fund of Knowledge was assessed and was intact.  Cranial Nerves II - XII - II - chronic right hemianopia. III, IV, VI - Extraocular movements intact. V - Facial sensation intact bilaterally. VII - chronic slight right nasolabial fold flattening. VIII - Hearing & vestibular intact bilaterally. X - Palate elevates symmetrically. XI - Chin turning & shoulder shrug intact bilaterally. XII - Tongue protrusion intact.  Motor Strength - The patient's strength was normal in all extremities except left UE 4/5 deltoid, bicep, tricep and finger grip with finger dexterities.  Bulk was normal and fasciculations were absent.   Motor Tone  - Muscle tone was assessed at the neck and appendages and was normal.  Reflexes - The patient's reflexes were symmetrical in all extremities and she had no pathological reflexes.  Sensory - Light touch, temperature/pinprick were assessed and were symmetrical.    Coordination - The patient had normal movements in the hands with no ataxia or dysmetria.  Tremor was absent.  Gait and Station - deferred.   ASSESSMENT/PLAN [name] is a 49 y.o. 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 The patient received IV t-PA Fraiday 11/30/19 at 1530.    Right frontal cortical small stroke s/p tPA, embolic pattern, etiology unclear  CT Head - No evidence of acute intracranial abnormality. ASPECTS is 10. Chronic left PCA and MCA infarcts.    CTA H&N - No evidence of large vessel occlusion or hemodynamically significant proximal stenosis in the head or neck.   MRI head - Small acute posterior right frontal lobe infarct.  2D Echo - pending  LE venous doppler no DVT  Given young age with recurrent strokes, will check TEE  May consider loop recorder to rule out afib given recurrent embolic strokes  [employer] Virus 2 - negative  LDL - 97  HgbA1c - 5.7  UDS - negative  Repeat parts of the hypercoagulable work up which was abnormal in 2016 - pending   VTE prophylaxis - SCDs  aspirin 81 mg daily prior to admission, now on ASA and palvix DAPT 24h post tPA  Patient will be counseled to be compliant with her antithrombotic medications  Ongoing aggressive stroke risk factor management  Therapy recommendations:  HH PT and OT   Disposition:  Pending  Recurrent strokes  Per patient, she had a for stroke at age of 61  Second left PCA stroke in 2014 with right  hemianopia, right side weakness.  Hypercoagulable work-up in 2016 showed negative ANA and APS antibodies, however positive for mildly low protein C level 47, mildly high homocystine 20, slightly low Antithrombin III  On home aspirin 81  Unclear etiology of the strokes.  Family history of CHF, strokes  Current admission for right frontal cortical small infarct  TEE pending   Hypertension  Home BP meds: Norvasc ; catapres 0.3 tid; lasix  Current BP meds: norvasc, lasix, clonidine 0.2 tid  Stable on the high end . BP goal < 180/105 . Long-term BP goal  normotensive  Hyperlipidemia  Home Lipid lowering medication: Zocor 10 mg daily  LDL 97, goal < 70  Current lipid lowering medication: increase zocor to 40 mg daily  Continue statin at discharge  AKI on CKD  cre - 1.92->2.00->1.94->2.68   Pt does have mild diarrhea   Will start IVF  BMP monitoring  Tobacco abuse  Current smoker  Smoking cessation counseling provided  Nicotine patch provided  Pt is willing to quit  Other Stroke Risk Factors  Previous ETOH use  Obesity, Body mass index is 30.03 kg/m., recommend weight loss, diet and exercise as appropriate   Coronary artery disease  Migraines  Other Active Problems  Code status - Full code CKD - stage 3b - (monitor - recheck in AM)  Hyperkalemia - 5.0->5.5->3.7->3.6  Tachycardia - 88 - 135 ? Rebound tachy due to clonidine on hold yesterday ?  Hospital day # 2  This patient is critically ill due to right MCA stroke, recurrent strokes at young age, AKI, hypertensive urgency and at significant risk of neurological worsening, death form recurrent strokes, hemorrhagic conversion, renal failure, seizure. This patient's care requires constant monitoring of vital signs, hemodynamics, respiratory and cardiac monitoring, review of multiple databases, neurological assessment, discussion with family, other specialists and medical decision making of high  complexity. I spent 35 minutes of neurocritical care time in the care of this patient.  Rosalin Hawking, MD PhD Stroke Neurology 12/02/2019 5:22 PM   To contact Stroke Continuity provider, please refer to [email or web address] After hours, contact General Neurology

## 2019-12-02 NOTE — Progress Notes (Signed)
STROKE TEAM PROGRESS NOTE   INTERVAL HISTORY No family at bedside.  Patient sitting in bed, asking for her clonidine.  Otherwise, neuro stable, still has left hand dexterity difficulty.  Pending TEE tomorrow.  OBJECTIVE Vitals:   12/02/19 1000 12/02/19 1100 12/02/19 1200 12/02/19 1300  BP: (!) 171/65 (!) 171/91 (!) 172/80 (!) 172/88  Pulse: 99 (!) 113 (!) 124 (!) 135  Resp: (!) 21 16 (!) 21 (!) 24  Temp:      TempSrc:      SpO2: 98% 98% 100% 99%  Weight:      Height:        CBC:  Recent Labs  Lab 11/30/19 1502 11/30/19 1507 12/01/19 0223 12/02/19 0053  WBC 9.9   < > 8.9 10.3  NEUTROABS 5.9  --   --   --   HGB 12.3   < > 12.5 13.4  HCT 37.9   < > 36.8 39.7  MCV 100.8*   < > 97.4 96.4  PLT 232   < > 243 295   < > = values in this interval not displayed.    Basic Metabolic Panel:  Recent Labs  Lab 12/01/19 0223 12/02/19 0053  NA 138 141  K 3.7 3.6  CL 106 105  CO2 21* 22  GLUCOSE 86 95  BUN 22* 22*  CREATININE 1.94* 2.68*  CALCIUM 8.7* 9.6    Lipid Panel:     Component Value Date/Time   CHOL 155 12/01/2019 0223   CHOL 177 09/20/2019 1912   CHOL 123 10/20/2012 0226   TRIG 169 (H) 12/01/2019 0223   TRIG 85 10/20/2012 0226   HDL 24 (L) 12/01/2019 0223   HDL 24 (L) 09/20/2019 1912   HDL 20 (L) 10/20/2012 0226   CHOLHDL 6.5 12/01/2019 0223   VLDL 34 12/01/2019 0223   VLDL 17 10/20/2012 0226   LDLCALC 97 12/01/2019 0223   LDLCALC 116 (H) 09/20/2019 1912   LDLCALC 86 10/20/2012 0226   HgbA1c:  Lab Results  Component Value Date   HGBA1C 5.7 (H) 12/01/2019   Urine Drug Screen:     Component Value Date/Time   LABOPIA NONE DETECTED 12/01/2019 0134   COCAINSCRNUR NONE DETECTED 12/01/2019 0134   COCAINSCRNUR NEGATIVE 07/19/2011 1605   LABBENZ NONE DETECTED 12/01/2019 0134   AMPHETMU NONE DETECTED 12/01/2019 0134   THCU NONE DETECTED 12/01/2019 0134   LABBARB NONE DETECTED 12/01/2019 0134    Alcohol Level     Component Value Date/Time   ETH <10  11/30/2019 1502    IMAGING  CT HEAD CODE STROKE WO CONTRAST 11/30/2019 IMPRESSION:  1. No evidence of acute intracranial abnormality.  2. ASPECTS is 10.  3. Chronic left PCA and MCA infarcts.   CT ANGIO HEAD CODE STROKE CT ANGIO NECK CODE STROKE 11/30/2019 IMPRESSION:  No evidence of large vessel occlusion or hemodynamically significant proximal stenosis in the head or neck.   MR BRAIN WO CONTRAST 12/01/2019 IMPRESSION:  1. Small acute posterior right frontal lobe infarct.  2. Age advanced chronic ischemia with multiple old infarcts as above.   Transthoracic Echocardiogram  Pending  ECG - SR rate 68 BPM. (See cardiology reading for complete details)  PHYSICAL EXAM  Temp:  [98.4 F (36.9 C)-98.9 F (37.2 C)] 98.6 F (37 C) (11/28 0844) Pulse Rate:  [73-135] 135 (11/28 1300) Resp:  [13-31] 24 (11/28 1300) BP: (149-179)/(63-103) 172/88 (11/28 1300) SpO2:  [94 %-100 %] 99 % (11/28 1300)  General - Well nourished, well developed,  in no apparent distress.  Ophthalmologic - fundi not visualized due to noncooperation.  Cardiovascular - Regular rhythm and rate.  Mental Status -  Level of arousal and orientation to time, place, and person were intact. Language including expression, naming, repetition, comprehension was assessed and found intact. Fund of Knowledge was assessed and was intact.  Cranial Nerves II - XII - II - chronic right hemianopia. III, IV, VI - Extraocular movements intact. V - Facial sensation intact bilaterally. VII - chronic slight right nasolabial fold flattening. VIII - Hearing & vestibular intact bilaterally. X - Palate elevates symmetrically. XI - Chin turning & shoulder shrug intact bilaterally. XII - Tongue protrusion intact.  Motor Strength - The patient's strength was normal in all extremities except left UE 4/5 deltoid, bicep, tricep and finger grip with finger dexterities.  Bulk was normal and fasciculations were absent.   Motor Tone  - Muscle tone was assessed at the neck and appendages and was normal.  Reflexes - The patient's reflexes were symmetrical in all extremities and she had no pathological reflexes.  Sensory - Light touch, temperature/pinprick were assessed and were symmetrical.    Coordination - The patient had normal movements in the hands with no ataxia or dysmetria.  Tremor was absent.  Gait and Station - deferred.   ASSESSMENT/PLAN Ms. TALAYIA HJORT is a 49 y.o. 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 The patient received IV t-PA Fraiday 11/30/19 at 1530.    Right frontal cortical small stroke s/p tPA, embolic pattern, etiology unclear  CT Head - No evidence of acute intracranial abnormality. ASPECTS is 10. Chronic left PCA and MCA infarcts.    CTA H&N - No evidence of large vessel occlusion or hemodynamically significant proximal stenosis in the head or neck.   MRI head - Small acute posterior right frontal lobe infarct.  2D Echo - pending  LE venous doppler no DVT  Given young age with recurrent strokes, will check TEE  May consider loop recorder to rule out afib given recurrent embolic strokes  [employer] Virus 2 - negative  LDL - 97  HgbA1c - 5.7  UDS - negative  Repeat parts of the hypercoagulable work up which was abnormal in 2016 - pending   VTE prophylaxis - SCDs  aspirin 81 mg daily prior to admission, now on ASA and palvix DAPT 24h post tPA  Patient will be counseled to be compliant with her antithrombotic medications  Ongoing aggressive stroke risk factor management  Therapy recommendations:  HH PT and OT   Disposition:  Pending  Recurrent strokes  Per patient, she had a for stroke at age of 49  Second left PCA stroke in 2014 with right  hemianopia, right side weakness.  Hypercoagulable work-up in 2016 showed negative ANA and APS antibodies, however positive for mildly low protein C level 47,  mildly high homocystine 20, slightly low Antithrombin III  On home aspirin 81  Unclear etiology of the strokes.  Family history of CHF, strokes  Current admission for right frontal cortical small infarct  TEE pending   Hypertension  Home BP meds: Norvasc ; catapres 0.3 tid; lasix  Current BP meds: norvasc, lasix, clonidine 0.2 tid  Stable on the high end . BP goal < 180/105 . Long-term BP goal  normotensive  Hyperlipidemia  Home Lipid lowering medication: Zocor 10 mg daily  LDL 97, goal < 70  Current lipid lowering medication: increase zocor to 40 mg daily  Continue statin at discharge  AKI on CKD  cre - 1.92->2.00->1.94->2.68   Pt does have mild diarrhea   Will start IVF  BMP monitoring  Tobacco abuse  Current smoker  Smoking cessation counseling provided  Nicotine patch provided  Pt is willing to quit  Other Stroke Risk Factors  Previous ETOH use  Obesity, Body mass index is 30.03 kg/m., recommend weight loss, diet and exercise as appropriate   Coronary artery disease  Migraines  Other Active Problems  Code status - Full code CKD - stage 3b - (monitor - recheck in AM)  Hyperkalemia - 5.0->5.5->3.7->3.6  Tachycardia - 88 - 135 ? Rebound tachy due to clonidine on hold yesterday ?  Hospital day # 2  This patient is critically ill due to right MCA stroke, recurrent strokes at young age, AKI, hypertensive urgency and at significant risk of neurological worsening, death form recurrent strokes, hemorrhagic conversion, renal failure, seizure. This patient's care requires constant monitoring of vital signs, hemodynamics, respiratory and cardiac monitoring, review of multiple databases, neurological assessment, discussion with family, other specialists and medical decision making of high  complexity. I spent 35 minutes of neurocritical care time in the care of this patient.  Rosalin Hawking, MD PhD Stroke Neurology 12/02/2019 5:22 PM   To contact Stroke Continuity provider, please refer to [email or web address] After hours, contact General Neurology

## 2019-12-03 ENCOUNTER — Inpatient Hospital Stay (HOSPITAL_COMMUNITY): Payer: Self-pay

## 2019-12-03 ENCOUNTER — Inpatient Hospital Stay (HOSPITAL_COMMUNITY): Payer: Self-pay | Admitting: Anesthesiology

## 2019-12-03 ENCOUNTER — Encounter (HOSPITAL_COMMUNITY): Payer: Self-pay | Admitting: Neurology

## 2019-12-03 ENCOUNTER — Encounter (HOSPITAL_COMMUNITY)
Admission: EM | Disposition: A | Discharge status: Home Health | Payer: Self-pay | Source: Home / Self Care | Attending: Neurology

## 2019-12-03 ENCOUNTER — Other Ambulatory Visit (HOSPITAL_COMMUNITY): Payer: Self-pay | Admitting: Nurse Practitioner

## 2019-12-03 DIAGNOSIS — M3211 Endocarditis in systemic lupus erythematosus: Secondary | ICD-10-CM | POA: Diagnosis present

## 2019-12-03 DIAGNOSIS — I6389 Other cerebral infarction: Secondary | ICD-10-CM

## 2019-12-03 DIAGNOSIS — I351 Nonrheumatic aortic (valve) insufficiency: Secondary | ICD-10-CM

## 2019-12-03 DIAGNOSIS — Z720 Tobacco use: Secondary | ICD-10-CM

## 2019-12-03 DIAGNOSIS — I252 Old myocardial infarction: Secondary | ICD-10-CM | POA: Diagnosis present

## 2019-12-03 DIAGNOSIS — I639 Cerebral infarction, unspecified: Secondary | ICD-10-CM | POA: Diagnosis present

## 2019-12-03 DIAGNOSIS — E669 Obesity, unspecified: Secondary | ICD-10-CM | POA: Diagnosis present

## 2019-12-03 DIAGNOSIS — G43909 Migraine, unspecified, not intractable, without status migrainosus: Secondary | ICD-10-CM | POA: Diagnosis present

## 2019-12-03 DIAGNOSIS — I219 Acute myocardial infarction, unspecified: Secondary | ICD-10-CM | POA: Diagnosis present

## 2019-12-03 DIAGNOSIS — E785 Hyperlipidemia, unspecified: Secondary | ICD-10-CM | POA: Diagnosis present

## 2019-12-03 DIAGNOSIS — N179 Acute kidney failure, unspecified: Secondary | ICD-10-CM | POA: Diagnosis present

## 2019-12-03 HISTORY — PX: TEE WITHOUT CARDIOVERSION: SHX5443

## 2019-12-03 HISTORY — PX: BUBBLE STUDY: SHX6837

## 2019-12-03 LAB — ECHOCARDIOGRAM COMPLETE
Area-P 1/2: 2.66 cm2
Calc EF: 72.4 %
Height: 61 in
P 1/2 time: 568 msec
S' Lateral: 2.64 cm
Single Plane A2C EF: 74.4 %
Single Plane A4C EF: 71.6 %
Weight: 2543.23 oz

## 2019-12-03 LAB — BASIC METABOLIC PANEL
Anion gap: 14 (ref 5–15)
BUN: 25 mg/dL — ABNORMAL HIGH (ref 6–20)
CO2: 21 mmol/L — ABNORMAL LOW (ref 22–32)
Calcium: 9.1 mg/dL (ref 8.9–10.3)
Chloride: 103 mmol/L (ref 98–111)
Creatinine, Ser: 2.2 mg/dL — ABNORMAL HIGH (ref 0.44–1.00)
GFR, Estimated: 27 mL/min — ABNORMAL LOW (ref >60)
Glucose, Bld: 99 mg/dL (ref 70–99)
Potassium: 3.4 mmol/L — ABNORMAL LOW (ref 3.5–5.1)
Sodium: 138 mmol/L (ref 135–145)

## 2019-12-03 LAB — CBC
HCT: 38.3 % (ref 36.0–46.0)
Hemoglobin: 12.8 g/dL (ref 12.0–15.0)
MCH: 32.3 pg (ref 26.0–34.0)
MCHC: 33.4 g/dL (ref 30.0–36.0)
MCV: 96.7 fL (ref 80.0–100.0)
Platelets: 262 10*3/uL (ref 150–400)
RBC: 3.96 MIL/uL (ref 3.87–5.11)
RDW: 16.4 % — ABNORMAL HIGH (ref 11.5–15.5)
WBC: 8.8 10*3/uL (ref 4.0–10.5)
nRBC: 0 % (ref 0.0–0.2)

## 2019-12-03 LAB — LUPUS ANTICOAGULANT PANEL
DRVVT: 43.3 s (ref 0.0–47.0)
PTT Lupus Anticoagulant: 38.4 s (ref 0.0–51.9)

## 2019-12-03 LAB — PROTEIN C ACTIVITY: Protein C Activity: 129 % (ref 73–180)

## 2019-12-03 LAB — HOMOCYSTEINE: Homocysteine: 52 umol/L — ABNORMAL HIGH (ref 0.0–14.5)

## 2019-12-03 SURGERY — ECHOCARDIOGRAM, TRANSESOPHAGEAL
Anesthesia: Monitor Anesthesia Care

## 2019-12-03 MED ORDER — CLONIDINE HCL 0.2 MG PO TABS: 0.2000 mg | ORAL_TABLET | Freq: Three times a day (TID) | ORAL | 11 refills | Status: DC

## 2019-12-03 MED ORDER — ONDANSETRON HCL 4 MG/2ML IJ SOLN
INTRAMUSCULAR | Status: DC | PRN
  Administered 2019-12-03: 4 mg via INTRAVENOUS

## 2019-12-03 MED ORDER — LIDOCAINE 2% (20 MG/ML) 5 ML SYRINGE
INTRAMUSCULAR | Status: DC | PRN
  Administered 2019-12-03: 60 mg via INTRAVENOUS
  Administered 2019-12-03: 40 mg via INTRAVENOUS

## 2019-12-03 MED ORDER — AMLODIPINE BESYLATE 10 MG PO TABS: 10.0000 mg | ORAL_TABLET | Freq: Every day | ORAL | 2 refills | Status: DC

## 2019-12-03 MED ORDER — PROPOFOL 10 MG/ML IV BOLUS
INTRAVENOUS | Status: DC | PRN
  Administered 2019-12-03: 10 mg via INTRAVENOUS
  Administered 2019-12-03 (×3): 20 mg via INTRAVENOUS

## 2019-12-03 MED ORDER — NICOTINE 14 MG/24HR TD PT24: 14.0000 mg | MEDICATED_PATCH | Freq: Every day | TRANSDERMAL | 0 refills | Status: AC

## 2019-12-03 MED ORDER — SIMVASTATIN 40 MG PO TABS: 40.0000 mg | ORAL_TABLET | Freq: Every day | ORAL | 2 refills | Status: DC

## 2019-12-03 MED ORDER — CLOPIDOGREL BISULFATE 75 MG PO TABS: 75.0000 mg | ORAL_TABLET | Freq: Every day | ORAL | 2 refills | Status: DC

## 2019-12-03 MED ORDER — PROPOFOL 500 MG/50ML IV EMUL
INTRAVENOUS | Status: DC | PRN
  Administered 2019-12-03: 150 ug/kg/min via INTRAVENOUS

## 2019-12-03 MED ORDER — SODIUM CHLORIDE 0.9 % IV SOLN: INTRAVENOUS | Status: DC

## 2019-12-03 MED ORDER — ASPIRIN 81 MG PO CHEW: 81.0000 mg | CHEWABLE_TABLET | Freq: Every day | ORAL | 0 refills | Status: DC

## 2019-12-03 NOTE — Progress Notes (Addendum)
Occupational Therapy Treatment Patient Details Name: Gabrielle Schultz MRN: [id number] DOB: 06-09-1970 Today's Date: 12/03/2019    History of present illness This 49 y.o. female admitted with sudden Lt sided weakness. Received tPA.  Head CT showed no evidence of acute intracranial abnormality; chronic Lt PCA and MCA infarcts.  CTA negative for LVO.  PMH includes:  h/o syncope, h/o CVA, renal insufficiency, migraines, MI, HTN, GOUT, coliltis, CKD, CTS, asthma    OT comments  Pt participating in Pill Box Test during today's session given she takes care of her family's medications at home. Pt did require >5 min to complete test (approx 10 min), but completed with only x1 error throughout (>3 errors is considered failing). Suspect pt with increased ease of task performance given this is a familiar task; in speaking with PT pt still presenting with cognitive impairments such as reduced attention and problem solving during today's treatment session. Feel HHOT recommendation remains most appropriate at this time. Pt is hopeful for d/c home today, but will continue to follow while she remains acutely admitted.    Follow Up Recommendations  Home health OT;Supervision/Assistance - 24 hour (likely to benefit from outpt OT therafter for cognition)    Equipment Recommendations  Tub/shower seat          Precautions / Restrictions Precautions Precautions: Fall Restrictions Weight Bearing Restrictions: No       Mobility Bed Mobility Overal bed mobility: Modified Independent Bed Mobility: Supine to Sit;Sit to Supine     Supine to sit: Modified independent (Device/Increase time) Sit to supine: Modified independent (Device/Increase time)   General bed mobility comments: pt seated in long sitting upon arrival   Transfers Overall transfer level: Needs assistance Equipment used: None Transfers: Sit to/from [facility] Sit to Stand: Supervision Stand pivot transfers: Supervision        General transfer comment: focus on cognitive activity/Pill Box Test     Balance Overall balance assessment: Needs assistance Sitting-balance support: No upper extremity supported Sitting balance-Leahy Scale: Normal     Standing balance support: No upper extremity supported;During functional activity Standing balance-Leahy Scale: Fair   Single Leg Stance - Right Leg: 10 Single Leg Stance - Left Leg: 4 Tandem Stance - Right Leg: 15 Tandem Stance - Left Leg: 5 [place] - Eyes Opened: 20 [place]berg - Eyes Closed: 20 High level balance activites: Turns;Direction changes;Head turns High Level Balance Comments: pt with significantly slowed gait but no LOB Standardized Balance Assessment Standardized Balance Assessment : Dynamic Gait Index   Dynamic Gait Index Level Surface: Mild Impairment Change in Gait Speed: Mild Impairment Gait with Horizontal Head Turns: Normal Gait with Vertical Head Turns: Normal Gait and Pivot Turn: Mild Impairment Step Over Obstacle: Moderate Impairment Step Around Obstacles: Mild Impairment Steps: Mild Impairment Total Score: 17     ADL either performed or assessed with clinical judgement   ADL                                         General ADL Comments: focus on Pill Box Test                        Cognition Arousal/Alertness: Awake/alert Behavior During Therapy: WFL for tasks assessed/performed Overall Cognitive Status: Impaired/Different from baseline Area of Impairment: Attention;Following commands;Safety/judgement;Awareness;Problem solving  Current Attention Level: Alternating   Following Commands: Follows one step commands consistently;Follows multi-step commands inconsistently Safety/Judgement: Decreased awareness of deficits Awareness: Emergent Problem Solving: Slow processing;Requires verbal cues;Requires tactile cues General Comments: administered Pill Box Test, pt requiring >5  min to perform (approx 10 min) and with x1 error throughout.         Exercises     Shoulder Instructions       General Comments VAA on RA    Pertinent Vitals/ Pain       Pain Assessment: No/denies pain  Home Living                                          Prior Functioning/Environment              Frequency  Min 2X/week        Progress Toward Goals  OT Goals(current goals can now be found in the care plan section)  Progress towards OT goals: Progressing toward goals  Acute Rehab OT Goals Patient Stated Goal: To get home  OT Goal Formulation: With patient Time For Goal Achievement: 12/15/19 Potential to Achieve Goals: Good ADL Goals Additional ADL Goal #1: Pt will be able to perform simulated medication management task with min cues Additional ADL Goal #2: Pt will perform mod complex path findind with no more than 2 cues  Plan Discharge plan remains appropriate    Co-evaluation                 AM-PAC OT "6 Clicks" Daily Activity     Outcome Measure   Help from another person eating meals?: None Help from another person taking care of personal grooming?: A Little Help from another person toileting, which includes using toliet, bedpan, or urinal?: A Little Help from another person bathing (including washing, rinsing, drying)?: A Little Help from another person to put on and taking off regular upper body clothing?: A Little Help from another person to put on and taking off regular lower body clothing?: A Little 6 Click Score: 19    End of Session    OT Visit Diagnosis: Unsteadiness on feet (R26.81);Cognitive communication deficit (R41.841) Symptoms and signs involving cognitive functions: Cerebral infarction   Activity Tolerance Patient tolerated treatment well   Patient Left in chair;with call bell/phone within reach   Nurse Communication Mobility status        Time: 9842-1031 OT Time Calculation (min): 18  min  Charges: OT General Charges $OT Visit: 1 Visit OT Treatments $Cognitive Funtion inital: Initial 15 mins  Lou Cal, OT Acute Rehabilitation Services Pager [phone] Office [place] 12/03/2019, 6:02 PM

## 2019-12-03 NOTE — Progress Notes (Signed)
Pt transferred from 4N. Pt is alert and oriented. Pt awaiting TEE procedure. Pt is NPO at this time. Pt put on telemetry with Sinus Rhythm noted on the monitor. Pt has call button within reach and bed at lowest position with bed alarm set.

## 2019-12-03 NOTE — Progress Notes (Signed)
  Echocardiogram Echocardiogram Transesophageal has been performed.  Johny Chess 12/03/2019, 1:53 PM

## 2019-12-03 NOTE — Anesthesia Procedure Notes (Signed)
Procedure Name: MAC Date/Time: 12/03/2019 1:00 PM Performed by: Dorthea Cove, CRNA Pre-anesthesia Checklist: Patient identified, Emergency Drugs available, Suction available, Patient being monitored and Timeout performed Patient Re-evaluated:Patient Re-evaluated prior to induction Oxygen Delivery Method: Nasal cannula Preoxygenation: Pre-oxygenation with 100% oxygen Induction Type: IV induction

## 2019-12-03 NOTE — Progress Notes (Signed)
Physical Therapy Treatment Patient Details Name: Gabrielle Schultz MRN: [id number] DOB: 1970/02/12 Today's Date: 12/03/2019    History of Present Illness This 49 y.o. female admitted with sudden Lt sided weakness. Received tPA.  Head CT showed no evidence of acute intracranial abnormality; chronic Lt PCA and MCA infarcts.  CTA negative for LVO.  PMH includes:  h/o syncope, h/o CVA, renal insufficiency, migraines, MI, HTN, GOUT, coliltis, CKD, CTS, asthma     PT Comments    The pt is making progress with mobility, stability, and PT goals at this time. She remains limited by cognitive deficits such as poor problem solving, memory, and attention to a task requiring cues for tasks such as counting backwards by 2 or reciting the alphabet. The pt also continues to have significant difficulty with sequencing and following multiple-step commands without repeated cues. The pt will still benefit from skilled PT to improve functional stability, and safety with mobility during dual task activities.     Follow Up Recommendations  Home health PT;Supervision for mobility/OOB     Equipment Recommendations  None recommended by PT    Recommendations for Other Services       Precautions / Restrictions Precautions Precautions: Fall Restrictions Weight Bearing Restrictions: No    Mobility  Bed Mobility Overal bed mobility: Modified Independent Bed Mobility: Supine to Sit;Sit to Supine     Supine to sit: Modified independent (Device/Increase time) Sit to supine: Modified independent (Device/Increase time)   General bed mobility comments: HOB elevated, but no assist needed  Transfers Overall transfer level: Needs assistance Equipment used: None Transfers: Sit to/from [employer] Transfers Sit to Stand: Supervision Stand pivot transfers: Supervision       General transfer comment: supervision for safety, no physical assist  Ambulation/Gait Ambulation/Gait assistance:  Supervision Gait Distance (Feet): 200 Feet Assistive device: None Gait Pattern/deviations: Step-through pattern;Decreased stride length Gait velocity: 0.4 m/s Gait velocity interpretation: <1.31 ft/sec, indicative of household ambulator General Gait Details: decreased stride length but no LOB with gait. Pt able to manage minor perturbations without LOB such as opening heavy door   Stairs Stairs: Yes Stairs assistance: Min guard Stair Management: One rail Right;Alternating pattern;Forwards Number of Stairs: 2 (x2)        Modified Rankin (Stroke Patients Only) Modified Rankin (Stroke Patients Only) Pre-Morbid Rankin Score: No significant disability Modified Rankin: Moderately severe disability     Balance Overall balance assessment: Needs assistance Sitting-balance support: No upper extremity supported Sitting balance-Leahy Scale: Normal     Standing balance support: No upper extremity supported;During functional activity Standing balance-Leahy Scale: Fair   Single Leg Stance - Right Leg: 10 Single Leg Stance - Left Leg: 4 Tandem Stance - Right Leg: 15 Tandem Stance - Left Leg: 5 [place] - Eyes Opened: 20 Rh[place]berg - Eyes Closed: 20 High level balance activites: Turns;Direction changes;Head turns High Level Balance Comments: pt with significantly slowed gait but no LOB Standardized Balance Assessment Standardized Balance Assessment : Dynamic Gait Index   Dynamic Gait Index Level Surface: Mild Impairment Change in Gait Speed: Mild Impairment Gait with Horizontal Head Turns: Normal Gait with Vertical Head Turns: Normal Gait and Pivot Turn: Mild Impairment Step Over Obstacle: Moderate Impairment Step Around Obstacles: Mild Impairment Steps: Mild Impairment Total Score: 17      Cognition Arousal/Alertness: Awake/alert Behavior During Therapy: WFL for tasks assessed/performed Overall Cognitive Status: Impaired/Different from baseline Area of Impairment:  Attention;Following commands;Safety/judgement;Awareness;Problem solving  Current Attention Level: Alternating   Following Commands: Follows one step commands consistently;Follows multi-step commands inconsistently Safety/Judgement: Decreased awareness of deficits Awareness: Emergent Problem Solving: Slow processing;Requires verbal cues;Requires tactile cues General Comments: Pt required cues for reciting alphabet and counting backwards by 2s. Able to find room only when told room number (pt followed signs)      Exercises      General Comments General comments (skin integrity, edema, etc.): VAA on RA      Pertinent Vitals/Pain Pain Assessment: No/denies pain           PT Goals (current goals can now be found in the care plan section) Acute Rehab PT Goals Patient Stated Goal: To get home  PT Goal Formulation: With patient Time For Goal Achievement: 12/15/19 Potential to Achieve Goals: Good Progress towards PT goals: Progressing toward goals    Frequency    Min 4X/week      PT Plan Current plan remains appropriate       AM-PAC PT "6 Clicks" Mobility   Outcome Measure  Help needed turning from your back to your side while in a flat bed without using bedrails?: None Help needed moving from lying on your back to sitting on the side of a flat bed without using bedrails?: None Help needed moving to and from a bed to a chair (including a wheelchair)?: A Little Help needed standing up from a chair using your arms (e.g., wheelchair or bedside chair)?: A Little Help needed to walk in hospital room?: A Little Help needed climbing 3-5 steps with a railing? : A Little 6 Click Score: 20    End of Session Equipment Utilized During Treatment: Gait belt Activity Tolerance: Patient tolerated treatment well Patient left: in bed;with call bell/phone within reach;with bed alarm set Nurse Communication: Mobility status PT Visit Diagnosis: Other  abnormalities of gait and mobility (R26.89)     Time: 3557-3220 PT Time Calculation (min) (ACUTE ONLY): 29 min  Charges:  $Gait Training: 8-22 mins $Therapeutic Activity: 8-22 mins                     Karma Ganja, PT, DPT   Acute Rehabilitation Department Pager #: [phone]   Otho Bellows 12/03/2019, 5:11 PM

## 2019-12-03 NOTE — Discharge Summary (Addendum)
Stroke Discharge Summary  Patient ID: Gabrielle Schultz   MRN: [id number]      DOB: 1970-11-30  Date of Admission: 11/30/2019 Date of Discharge: 12/03/2019  Attending Physician:  Rosalin Hawking, MD, Stroke MD Consultant(s):   Eleonore Chiquito MD ( cardiology - TEE ) Patient's PCP:  Langston Reusing, NP  DISCHARGE DIAGNOSIS:  Principal Problem:   Cryptogenic stroke ([place]) Active Problems:   Hypertension   CKD (chronic kidney disease), stage IIIb ([place])   Tobacco abuse   Hyperlipidemia LDL goal <70   AKI (acute kidney injury) ([place])   Obesity   MI (myocardial infarction) ([place])   Migraines   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[place])   Allergies as of 12/03/2019      Reactions   Coconut Oil Hives, Swelling   Just coconut ("all coconut")   Amoxicillin Other (See Comments)   "makes me sick" "swelled up everywhere" Has patient had a PCN reaction causing immediate rash, facial/tongue/throat swelling, SOB or lightheadedness with hypotension: No Has patient had a PCN reaction causing severe rash involving mucus membranes or skin necrosis: No Has patient had a PCN reaction that required hospitalization: No Has patient had a PCN reaction occurring within the last 10 years: No If all of the above answers are "NO", then may proceed with Cephalosporin use.   Penicillins Other (See Comments)   "makes me sick" "swelled up oil" Has patient had a PCN reaction causing immediate rash, facial/tongue/throat swelling, SOB or lightheadedness with hypotension: No Has patient had a PCN reaction causing severe rash involving mucus membranes or skin necrosis: No Has patient had a PCN reaction that required hospitalization: No Has patient had a PCN reaction occurring within the last 10 years: Yes If all of the above answers are "NO", then may proceed with Cephalosporin use.   Singulair [montelukast Sodium] Rash   "made heart race"      Medication List    STOP taking these medications   ipratropium  0.03 % nasal spray Commonly known as: ATROVENT     TAKE these medications   albuterol 108 (90 Base) MCG/ACT inhaler Commonly known as: Ventolin HFA INHALE 2 PUFFS EVERY 4 HOURS AS NEEDED FOR COUGH AND WHEEZING. What changed:   how much to take  how to take this  when to take this  reasons to take this  additional instructions   allopurinol 100 MG tablet Commonly known as: Zyloprim Take 1 tablet (100 mg total) by mouth daily.   amLODipine 10 MG tablet Commonly known as: NORVASC Take 1 tablet (10 mg total) by mouth daily. Start taking on: December 04, 2019   aspirin 81 MG chewable tablet Chew 1 tablet (81 mg total) by mouth daily for 21 days.   calcitRIOL 0.25 MCG capsule Commonly known as: ROCALTROL Take 0.25 mcg by mouth daily.   cetirizine 10 MG tablet Commonly known as: ZYRTEC Take 1 tablet (10 mg total) by mouth daily.   cloNIDine 0.2 MG tablet Commonly known as: CATAPRES Take 1 tablet (0.2 mg total) by mouth 3 (three) times daily. What changed:   medication strength  how much to take  how to take this  when to take this  additional instructions   clopidogrel 75 MG tablet Commonly known as: PLAVIX Take 1 tablet (75 mg total) by mouth daily. Start taking on: December 04, 2019   fluticasone 50 MCG/ACT nasal spray Commonly known as: FLONASE PLACE 1 SPRAY INTO BOTH NOSTRILS 2 TIMES A DAY What changed:  how much to take  how to take this  when to take this  additional instructions   Fluticasone-Salmeterol 100-50 MCG/DOSE Aepb Commonly known as: ADVAIR Inhale 1 puff into the lungs 2 (two) times daily.   furosemide 40 MG tablet Commonly known as: LASIX Take 1 tablet (40 mg total) by mouth daily.   nicotine 14 mg/24hr patch Commonly known as: NICODERM CQ - dosed in mg/24 hours Place 1 patch (14 mg total) onto the skin daily for 14 days. After 2 weeks, decrease dose to 7  Mg daily x 6 weeks then stop Start taking on: December 04, 2019    omeprazole 20 MG capsule Commonly known as: PRILOSEC Take 1 capsule (20 mg total) by mouth daily.   potassium chloride SA 20 MEQ tablet Commonly known as: KLOR-CON Take 1 tablet (20 mEq total) by mouth daily. What changed:   when to take this  reasons to take this   promethazine 25 MG tablet Commonly known as: PHENERGAN TAKE ONE TABLET BY MOUTH EVERY 6 HOURS AS NEEDED FOR NAUSEA OR VOMITING What changed: See the new instructions.   ranitidine 150 MG tablet Commonly known as: ZANTAC Take 1 tablet (150 mg total) by mouth 2 (two) times daily.   simvastatin 40 MG tablet Commonly known as: ZOCOR Take 1 tablet (40 mg total) by mouth at bedtime. What changed:   medication strength  how much to take       LABORATORY STUDIES CBC    Component Value Date/Time   WBC 8.8 12/03/2019 0125   RBC 3.96 12/03/2019 0125   HGB 12.8 12/03/2019 0125   HGB 11.9 12/15/2017 1937   HCT 38.3 12/03/2019 0125   HCT 36.3 12/15/2017 1937   PLT 262 12/03/2019 0125   PLT 292 12/15/2017 1937   MCV 96.7 12/03/2019 0125   MCV 88 12/15/2017 1937   MCV 93 05/19/2013 0541   MCH 32.3 12/03/2019 0125   MCHC 33.4 12/03/2019 0125   RDW 16.4 (H) 12/03/2019 0125   RDW 14.5 12/15/2017 1937   RDW 12.4 05/19/2013 0541   LYMPHSABS 2.8 11/30/2019 1502   LYMPHSABS 2.6 12/02/2015 1850   LYMPHSABS 1.1 05/19/2013 0541   MONOABS 0.8 11/30/2019 1502   MONOABS 0.3 05/19/2013 0541   EOSABS 0.3 11/30/2019 1502   EOSABS 0.3 12/02/2015 1850   EOSABS 0.0 05/19/2013 0541   BASOSABS 0.1 11/30/2019 1502   BASOSABS 0.0 12/02/2015 1850   BASOSABS 0.0 05/19/2013 0541   CMP    Component Value Date/Time   NA 138 12/03/2019 0125   NA 139 09/20/2019 1912   NA 132 (L) 05/20/2013 0508   K 3.4 (L) 12/03/2019 0125   K 3.9 05/20/2013 0508   CL 103 12/03/2019 0125   CL 107 05/20/2013 0508   CO2 21 (L) 12/03/2019 0125   CO2 21 05/20/2013 0508   GLUCOSE 99 12/03/2019 0125   GLUCOSE 91 05/20/2013 0508   BUN 25 (H)  12/03/2019 0125   BUN 18 09/20/2019 1912   BUN 16 05/20/2013 0508   CREATININE 2.20 (H) 12/03/2019 0125   CREATININE 1.65 (H) 05/20/2013 0508   CALCIUM 9.1 12/03/2019 0125   CALCIUM 7.9 (L) 05/20/2013 0508   PROT 7.1 11/30/2019 1502   PROT 7.2 09/20/2019 1912   PROT 7.8 05/18/2013 2113   ALBUMIN 3.5 11/30/2019 1502   ALBUMIN 4.1 09/20/2019 1912   ALBUMIN 3.8 05/18/2013 2113   AST 15 11/30/2019 1502   AST 29 05/18/2013 2113   ALT 12 11/30/2019 1502  ALT 18 05/18/2013 2113   ALKPHOS 116 11/30/2019 1502   ALKPHOS 196 (H) 05/18/2013 2113   BILITOT 1.3 (H) 11/30/2019 1502   BILITOT 0.2 09/20/2019 1912   BILITOT 0.5 05/18/2013 2113   GFRNONAA 27 (L) 12/03/2019 0125   GFRNONAA 38 (L) 05/20/2013 0508   GFRAA 36 (L) 09/20/2019 1912   GFRAA 45 (L) 05/20/2013 0508   COAGS Lab Results  Component Value Date   INR 1.0 11/30/2019   Lipid Panel    Component Value Date/Time   CHOL 155 12/01/2019 0223   CHOL 177 09/20/2019 1912   CHOL 123 10/20/2012 0226   TRIG 169 (H) 12/01/2019 0223   TRIG 85 10/20/2012 0226   HDL 24 (L) 12/01/2019 0223   HDL 24 (L) 09/20/2019 1912   HDL 20 (L) 10/20/2012 0226   CHOLHDL 6.5 12/01/2019 0223   VLDL 34 12/01/2019 0223   VLDL 17 10/20/2012 0226   LDLCALC 97 12/01/2019 0223   LDLCALC 116 (H) 09/20/2019 1912   LDLCALC 86 10/20/2012 0226   HgbA1C  Lab Results  Component Value Date   HGBA1C 5.7 (H) 12/01/2019   Urinalysis    Component Value Date/Time   COLORURINE YELLOW 12/01/2019 0134   APPEARANCEUR HAZY (A) 12/01/2019 0134   APPEARANCEUR Clear 10/06/2017 1938   LABSPEC 1.017 12/01/2019 0134   LABSPEC 1.006 05/18/2013 2113   PHURINE 6.0 12/01/2019 0134   GLUCOSEU NEGATIVE 12/01/2019 0134   GLUCOSEU Negative 05/18/2013 2113   HGBUR SMALL (A) 12/01/2019 0134   BILIRUBINUR NEGATIVE 12/01/2019 0134   BILIRUBINUR Negative 10/06/2017 1938   BILIRUBINUR Negative 05/18/2013 2113   KETONESUR NEGATIVE 12/01/2019 0134   PROTEINUR 100 (A)  12/01/2019 0134   NITRITE POSITIVE (A) 12/01/2019 0134   LEUKOCYTESUR MODERATE (A) 12/01/2019 0134   LEUKOCYTESUR Negative 05/18/2013 2113   Urine Drug Screen     Component Value Date/Time   LABOPIA NONE DETECTED 12/01/2019 0134   COCAINSCRNUR NONE DETECTED 12/01/2019 0134   COCAINSCRNUR NEGATIVE 07/19/2011 1605   LABBENZ NONE DETECTED 12/01/2019 0134   AMPHETMU NONE DETECTED 12/01/2019 0134   THCU NONE DETECTED 12/01/2019 0134   LABBARB NONE DETECTED 12/01/2019 0134    Alcohol Level    Component Value Date/Time   ETH <10 11/30/2019 1502    SIGNIFICANT DIAGNOSTIC STUDIES CT HEAD CODE STROKE WO CONTRAST 11/30/2019 1. No evidence of acute intracranial abnormality.  2. ASPECTS is 10.  3. Chronic left PCA and MCA infarcts.   CT ANGIO HEAD CODE STROKE CT ANGIO NECK CODE STROKE 11/30/2019 No evidence of large vessel occlusion or hemodynamically significant proximal stenosis in the head or neck.   MR BRAIN WO CONTRAST 12/01/2019 1. Small acute posterior right frontal lobe infarct.  2. Age advanced chronic ischemia with multiple old infarcts as above.   Transthoracic Echocardiogram  12/03/2019 1. Left ventricular ejection fraction, by estimation, is 65 to 70%. The left ventricle has normal function. The left ventricle has no regional wall motion abnormalities. There is severe asymmetric left ventricular hypertrophy of the basal-septal segment. Left ventricular diastolic parameters are consistent with Grade I diastolic dysfunction (impaired relaxation).  2. Right ventricular systolic function is normal. The right ventricular size is normal. Tricuspid regurgitation signal is inadequate for assessing PA pressure.  3. The mitral valve is normal in structure. Trivial mitral valve regurgitation.  4. The aortic valve was not well visualized. Aortic valve regurgitation is mild to moderate. No aortic stenosis is present.  5. The inferior vena cava is normal in size with  greater  than 50% respiratory variability, suggesting right atrial pressure of 3 mmHg.   Transesophageal Echocardiogram  12/03/2019 1. No LAA thrombus.  2. No PFO by color doppler or bubble study. 3. Moderate AI with central jet related to what appears to be degenerative valve disease with retraction of the leaflet tips.  4. Full report to follow.  ECG - SR rate 68 BPM. (See cardiology reading for complete details)   HISTORY OF PRESENT ILLNESS Gabrielle Schultz is an 49 y.o. female with a PMHx of GERD, asthma, Tobacco dependence, CKD, CVA x 2 (2009, 2015), HTN, MI, and migraine HAs. Pt was cooking lunch today at 1430 on 11/30/19 and suddenly had left sided weakness. No aphasia or dysarthria. No fall. 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  EMS was called and pt was activated as a CODE STROKE. Pt went to CT immediately after arriving.  After the initial CTH was negative for an ICH, tPA was offered. We discussed about 30% risk of improvement in stroke symptoms with tPA and about 2-3% risk of ICH and a total of 6% risk of systemic bleeding. The time sensitive nature of this intervention was explained to her. tPA discussion was completed at 1512. Patient wanted to hold off until she spoke to her husband. We discussed with patient's husband over the phone and she consented to tPA. In the CT scanner, RN started large bore IV left antecubital but pt complained of it burning and patient asked for the IV to be removed. This futher delayed tPA administration. It took a couple sticks to place a new IV, in the meantime, her SBP was up to 237S systolic but spontaneously came down. NIHHS-5. Baseline mRS: 1. tPA was eventually started. No thrombectomy as she did not have an LVO.   Pt does not remember having an arrhythmia in the past. Doesn't remember having an echo or TEE. Did have a cardiac cath. On ASA 81mg  at home.    HOSPITAL  COURSE [name] is a 49 y.o. 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 The patient received IV t-PA Fraiday 11/30/19 at 1530.   Right frontal cortical small stroke s/p tPA, embolic pattern, etiology unclear  CT Head - No evidence of acute intracranial abnormality. ASPECTS is 10. Chronic left PCA and MCA infarcts.    CTA H&N - No evidence of large vessel occlusion or hemodynamically significant proximal stenosis in the head or neck.   MRI head - Small acute posterior right frontal lobe infarct.  2D Echo - EF 65-70%. No source of embolus   LE venous doppler no DVT  TEE - neg PFO. No clot. Moderate AI w/ degenerative valve dz. AV appearance of libman sack endocarditis w/ leaflets retracted w/ post inflammatory look - follow up with Dr. Audie Box at [facility]  Not a loop candidate at this time as has no insurance. She will follow up with Dr. Audie Box as outpt to consider if needed  [facility] Virus 2 - negative  LDL - 97  HgbA1c - 5.7  UDS - negative  Repeat parts of the hypercoagulable work up which was abnormal in 2016 -  Homocysteine 52, antithrombin III 121, protein C pending B2GPI and  Cardiolipin pending   aspirin 81 mg daily prior to admission, on ASA and plavix DAPT for 3 weeks then plavix alone    Therapy recommendations:  HH PT and OT   Disposition:  pt adamant to return home 11/29  Recurrent strokes  Per patient, she had a stroke at age of 12  Second left PCA stroke in 2014 with right hemianopia, right side weakness.  Hypercoagulable work-up in 2016 showed negative ANA and APS antibodies, however positive for mildly low protein C level 47, mildly high homocystine 20, slightly low Antithrombin III  On home aspirin 81  Unclear  etiology of the strokes.  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 AV appearance of libman sack endocarditis w/ leaflets retracted w/ post inflammatory look  No known hx lupus  Draw blood cultures prior to d/c - pending  F/u Dr. Audie Box in 2-3 weeks  Hypertension  Home BP meds: Norvasc ; catapres 0.3 tid; lasix  Current BP meds: norvasc, lasix, clonidine 0.2 tid  Stable on the high end  BP goal < 180/105  Long-term BP goal normotensive  Hyperlipidemia  Home Lipid lowering medication: Zocor 10 mg daily  LDL 97, goal < 70  Current lipid lowering medication: increase zocor to 40 mg daily. Do not feel high intensity statin needed at this time given relatively low LDL level and zocor 40 likely to bring the LDL within the target.   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 rate 16, height 5\' 1"  (1.549 m), weight 72.1 kg, SpO2 99 %. General - Well nourished, well developed, in no apparent  distress.  Ophthalmologic - fundi not visualized due to noncooperation.  Cardiovascular - Regular rhythm and rate.  Mental Status -  Level of arousal and orientation to time, place, and person were intact. Language including expression, naming, repetition, comprehension was assessed and found intact. Fund of Knowledge was assessed and was intact.  Cranial Nerves II - XII - II - chronic right hemianopia. III, IV, VI - Extraocular movements intact. V - Facial sensation intact bilaterally. VII - chronic slight right nasolabial fold flattening. VIII - Hearing & vestibular intact bilaterally. X - Palate elevates symmetrically. XI - Chin turning & shoulder shrug intact bilaterally. XII - Tongue protrusion intact.  Motor Strength - The patient's strength was normal in all extremities except left UE 4/5 deltoid, bicep, tricep and finger grip with finger dexterities.  Bulk was normal and fasciculations were absent.   Motor Tone - Muscle tone was assessed at the neck and appendages and was normal.  Reflexes - The patient's reflexes were  symmetrical in all extremities and she had no pathological reflexes.  Sensory - Light touch, temperature/pinprick were assessed and were symmetrical.    Coordination - The patient had normal movements in the hands with no ataxia or dysmetria.  Tremor was absent.  Gait and Station - deferred.  Discharge Diet   Heart healthy thin liquids  DISCHARGE PLAN  Disposition:  Return home  Home health PT and OT  aspirin 81 mg daily and clopidogrel 75 mg daily for secondary stroke prevention for 3 weeks then PLAVIX alone.  Ongoing stroke risk factor control by Primary Care Physician at time of discharge  Follow up hypercoagulable panel labs  Follow-up PCP Gabrielle Schultz, Gabrielle E, NP in 2 weeks.  Follow-up Dr. Loretta Plume in 4 weeks, office to schedule an appointment.   Follow-up Dr. Audie Box in 2-3 weeks. His office will make arrangements and contact  patient  40 minutes were spent preparing discharge.  Rosalin Hawking, MD PhD Stroke Neurology 12/03/2019 7:19 PM

## 2019-12-03 NOTE — Anesthesia Preprocedure Evaluation (Addendum)
Anesthesia Evaluation  Patient identified by MRN, date of birth, ID band Patient awake    Reviewed: Allergy & Precautions, NPO status , Patient's Chart, lab work & pertinent test results  History of Anesthesia Complications Negative for: history of anesthetic complications  Airway Mallampati: III       Dental  (+) Teeth Intact   Pulmonary COPD, Current Smoker,     + decreased breath sounds      Cardiovascular hypertension, Pt. on medications + CAD, + Past MI, + Peripheral Vascular Disease and + DVT  + Valvular Problems/Murmurs AI and MR  Rhythm:Regular  ECHO 8/18 Left ventricle: The cavity size was normal. Wall thickness was  normal. Systolic function was normal. The estimated ejection  fraction was in the range of 55% to 65%.  - Aortic valve: There was severe regurgitation.  - Mitral valve: There was moderate regurgitation.  - Tricuspid valve: There was moderate regurgitation.    Neuro/Psych  Headaches, CVA negative psych ROS   GI/Hepatic Neg liver ROS, GERD  Medicated,  Endo/Other  negative endocrine ROS  Renal/GU Renal InsufficiencyRenal disease  negative genitourinary   Musculoskeletal negative musculoskeletal ROS (+)   Abdominal (+) + obese,   Peds negative pediatric ROS (+)  Hematology  (+) Blood dyscrasia, anemia ,   Anesthesia Other Findings   Reproductive/Obstetrics negative OB ROS                            Anesthesia Physical  Anesthesia Plan  ASA: III  Anesthesia Plan: MAC   Post-op Pain Management:    Induction: Intravenous  PONV Risk Score and Plan: 1  Airway Management Planned: Nasal Cannula, Natural Airway and Simple Face Mask  Additional Equipment:   Intra-op Plan:   Post-operative Plan:   Informed Consent: I have reviewed the patients History and Physical, chart, labs and discussed the procedure including the risks, benefits and alternatives for  the proposed anesthesia with the patient or authorized representative who has indicated his/her understanding and acceptance.       Plan Discussed with: CRNA and Anesthesiologist  Anesthesia Plan Comments:         Anesthesia Quick Evaluation

## 2019-12-03 NOTE — Progress Notes (Signed)
OT Cancellation Note  Patient Details Name: Gabrielle Schultz MRN: [id number] DOB: June 25, 1970   Cancelled Treatment:    Reason Eval/Treat Not Completed: Patient at procedure or test/ unavailable; pt transferred to new floor, now going for TEE. Will follow up as able.  Lou Cal, OT Acute Rehabilitation Services Pager [phone] Office [phone]   Gabrielle Schultz 12/03/2019, 12:19 PM

## 2019-12-03 NOTE — Progress Notes (Signed)
° ° °  [facility] has been requested to perform a transesophageal echocardiogram on Gabrielle Schultz for stroke.  After careful review of history and examination, the risks and benefits of transesophageal echocardiogram have been explained including risks of esophageal damage, perforation (1:10,000 risk), bleeding, pharyngeal hematoma as well as other potential complications associated with conscious sedation including aspiration, arrhythmia, respiratory failure and death. Alternatives to treatment were discussed, questions were answered. Patient is willing to proceed.   Chanette Demo Ninfa Meeker, PA-C  12/03/2019 10:06 AM

## 2019-12-03 NOTE — Progress Notes (Signed)
  Echocardiogram 2D Echocardiogram has been performed.  Gabrielle Schultz 12/03/2019, 9:42 AM

## 2019-12-03 NOTE — Transfer of Care (Signed)
Immediate Anesthesia Transfer of Care Note  Patient: Gabrielle Schultz  Procedure(s) Performed: TRANSESOPHAGEAL ECHOCARDIOGRAM (TEE) (N/A ) BUBBLE STUDY  Patient Location: Endoscopy Unit  Anesthesia Type:MAC  Level of Consciousness: awake and drowsy  Airway & Oxygen Therapy: Patient Spontanous Breathing  Post-op Assessment: Report given to RN and Post -op Vital signs reviewed and stable  Post vital signs: Reviewed and stable  Last Vitals:  Vitals Value Taken Time  BP    Temp    Pulse    Resp    SpO2      Last Pain:  Vitals:   12/03/19 1243  TempSrc: Oral  PainSc: 0-No pain      Patients Stated Pain Goal: 0 (88/33/74 4514)  Complications: No complications documented.

## 2019-12-03 NOTE — Anesthesia Postprocedure Evaluation (Signed)
Anesthesia Post Note  Patient: Gabrielle Schultz  Procedure(s) Performed: TRANSESOPHAGEAL ECHOCARDIOGRAM (TEE) (N/A ) BUBBLE STUDY     Patient location during evaluation: PACU Anesthesia Type: MAC Level of consciousness: awake and alert Pain management: pain level controlled Vital Signs Assessment: post-procedure vital signs reviewed and stable Respiratory status: spontaneous breathing, nonlabored ventilation, respiratory function stable and patient connected to nasal cannula oxygen Cardiovascular status: stable and blood pressure returned to baseline Postop Assessment: no apparent nausea or vomiting Anesthetic complications: no   No complications documented.  Last Vitals:  Vitals:   12/03/19 1356 12/03/19 1416  BP: (!) 163/63 (!) 154/73  Pulse: 78 77  Resp: 14 16  Temp:  36.8 C  SpO2: 100% 100%    Last Pain:  Vitals:   12/03/19 1416  TempSrc: Oral  PainSc:                  Silvia Hightower

## 2019-12-03 NOTE — Interval H&P Note (Signed)
History and Physical Interval Note:  12/03/2019 12:38 PM  Gabrielle Schultz  has presented today for surgery, with the diagnosis of stroke.  The various methods of treatment have been discussed with the patient and family. After consideration of risks, benefits and other options for treatment, the patient has consented to  Procedure(s): TRANSESOPHAGEAL ECHOCARDIOGRAM (TEE) (N/A) as a surgical intervention.  The patient's history has been reviewed, patient examined, no change in status, stable for surgery.  I have reviewed the patient's chart and labs.  Questions were answered to the patient's satisfaction.    TEE for stroke. NPO since midnight.   Gabrielle Schultz, [place]  [address] [place], [place] [address] [phone]  12:38 PM

## 2019-12-03 NOTE — CV Procedure (Addendum)
    TRANSESOPHAGEAL ECHOCARDIOGRAM   NAME:  Gabrielle Schultz    MRN: [id number] DOB:  01-08-1970    ADMIT DATE: 11/30/2019  INDICATIONS: Stroke  PROCEDURE:   Informed consent was obtained prior to the procedure. The risks, benefits and alternatives for the procedure were discussed and the patient comprehended these risks.  Risks include, but are not limited to, cough, sore throat, vomiting, nausea, somnolence, esophageal and stomach trauma or perforation, bleeding, low blood pressure, aspiration, pneumonia, infection, trauma to the teeth and death.    Procedural time out performed. The oropharynx was anesthetized with topical 1% benzocaine.    Anesthesia was administered by Dr. Ambrose Pancoast.   The patient's heart rate, blood pressure, and oxygen saturation are monitored continuously during the procedure. The period of conscious sedation is 25 minutes, of which I was present face-to-face 100% of this time.   The transesophageal probe was inserted in the esophagus and stomach without difficulty and multiple views were obtained.   COMPLICATIONS:    There were no immediate complications.  KEY FINDINGS:  1. No LAA thrombus.  2. No PFO by color doppler or bubble study. 3. Moderate AI with central jet related to what appears to be degenerative valve disease with retraction of the leaflet tips versus inflammatory disorder. See full report for description.  4. Further management per primary team.   Addison Naegeli. Audie Box, [place]  [address], [place], [place] [address] [phone]  1:43 PM

## 2019-12-04 LAB — CARDIOLIPIN ANTIBODIES, IGG, IGM, IGA
Anticardiolipin IgA: <9 APL U/mL (ref 0–11)
Anticardiolipin IgG: <9 GPL U/mL (ref 0–14)
Anticardiolipin IgM: <9 MPL U/mL (ref 0–12)

## 2019-12-04 LAB — BETA-2-GLYCOPROTEIN I ABS, IGG/M/A
Beta-2 Glyco I IgG: <9 GPI IgG units (ref 0–20)
Beta-2-Glycoprotein I IgA: <9 GPI IgA units (ref 0–25)
Beta-2-Glycoprotein I IgM: <9 GPI IgM units (ref 0–32)

## 2019-12-04 LAB — PROTEIN C, TOTAL: Protein C, Total: 109 % (ref 60–150)

## 2019-12-05 ENCOUNTER — Other Ambulatory Visit: Payer: Self-pay | Admitting: Gerontology

## 2019-12-06 ENCOUNTER — Other Ambulatory Visit: Payer: Self-pay

## 2019-12-06 ENCOUNTER — Encounter: Payer: Self-pay | Admitting: Internal Medicine

## 2019-12-06 ENCOUNTER — Ambulatory Visit (INDEPENDENT_AMBULATORY_CARE_PROVIDER_SITE_OTHER): Payer: Medicaid Other | Admitting: Internal Medicine

## 2019-12-06 VITALS — BP 132/78 | HR 78 | Temp 97.5°F | Ht 61.0 in | Wt 155.6 lb

## 2019-12-06 DIAGNOSIS — J4520 Mild intermittent asthma, uncomplicated: Secondary | ICD-10-CM

## 2019-12-06 DIAGNOSIS — Z720 Tobacco use: Secondary | ICD-10-CM

## 2019-12-06 NOTE — Patient Instructions (Addendum)
Continue inhalers as prescribed Please stop smoking!!!!

## 2019-12-06 NOTE — Progress Notes (Signed)
Name: Gabrielle Schultz MRN: [id number] DOB: Feb 07, 1970     CONSULTATION DATE: 12/06/2019  REFERRING MD : Nathaneil Canary  CHIEF COMPLAINT: SOB    HISTORY OF PRESENT ILLNESS: 49 y.o.femalewith a PMHx of GERD, asthma, Tobacco dependence, CKD, CVA x 2 (2009, 2015), HTN, MI, and migraine HAs.  PERVIOUS h/o CVA's  On 11/26/21and suddenly had left sided weakness.  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  EMS was called and pt was activated as a CODE STROKE. Pt went to CT immediately after arriving.   Patient seen today for assess for ongoing tobacco abuse and h/o ASTHMA Diagnosed with asthma as a child Despite having asthma she is still smoking She is around secondhand smoke She currently takes Advair and albuterol as needed Significant history for intubations and mechanical support   No exacerbation at this time No evidence of heart failure at this time No evidence or signs of infection at this time No respiratory distress No fevers, chills, nausea, vomiting, diarrhea No evidence of lower extremity edema No evidence hemoptysis   Smoking Assessment and Cessation Counseling   Upon further questioning, Patient smokes 1 ppd  I have advised patient to quit/stop smoking as soon as possible due to high risk for multiple medical problems  Patient is NOT willing to quit smoking  I have advised patient that we can assist and have options of Nicotine replacement therapy. I also advised patient on behavioral therapy and can provide oral medication therapy in conjunction with the other therapies  Follow up next Office visit  for assessment of smoking cessation  Smoking cessation counseling advised for 14 minutes    PAST MEDICAL HISTORY :   has a past medical history of Acid reflux, Allergic rhinitis (06/26/2015), Allergy, Asthma, Carpal tunnel syndrome, Chronic  kidney disease, Colitis, CVA (cerebral infarction), Gout, History of syncope, Hypertension (2008), Low HDL (under 40) (06/26/2015), MI (myocardial infarction) ([place]), Migraines, Poor circulation, Renal insufficiency, Stroke ([place]), and Syncope and collapse.  has a past surgical history that includes Colonoscopy with propofol (N/A, 06/26/2014); Cesarean section (1994); Dilation and curettage of uterus (x2 for incomplete SABs); Cardiac catheterization; TEE without cardioversion (N/A, 12/03/2019); and Bubble study (12/03/2019). Prior to Admission medications   Medication Sig Start Date End Date Taking? Authorizing Provider  albuterol (VENTOLIN HFA) 108 (90 Base) MCG/ACT inhaler INHALE 2 PUFFS EVERY 4 HOURS AS NEEDED FOR COUGH AND WHEEZING. Patient taking differently: Inhale 2 puffs into the lungs every 4 (four) hours as needed for wheezing (cough).  02/08/19   Zara Council A, PA-C  allopurinol (ZYLOPRIM) 100 MG tablet Take 1 tablet (100 mg total) by mouth daily. 02/08/19 02/08/20  Zara Council A, PA-C  amLODipine (NORVASC) 10 MG tablet Take 1 tablet (10 mg total) by mouth daily. 12/04/19   Donzetta Starch, NP  aspirin 81 MG chewable tablet Chew 1 tablet (81 mg total) by mouth daily for 21 days. 12/03/19 12/24/19  Donzetta Starch, NP  calcitRIOL (ROCALTROL) 0.25 MCG capsule Take 0.25 mcg by mouth daily.     [provider]  cetirizine (ZYRTEC) 10 MG tablet Take 1 tablet (10 mg total) by mouth daily. 07/19/19   Lanae Boast, FNP  cloNIDine (CATAPRES) 0.2 MG tablet Take 1 tablet (0.2 mg total) by mouth 3 (three) times daily. 12/03/19   Donzetta Starch, NP  clopidogrel (PLAVIX) 75 MG tablet Take 1 tablet (75 mg total) by mouth daily. 12/04/19  Donzetta Starch, NP  fluticasone (FLONASE) 50 MCG/ACT nasal spray PLACE 1 SPRAY INTO BOTH NOSTRILS 2 TIMES A DAY Patient taking differently: Place 1 spray into both nostrils in the morning and at bedtime.  03/30/18   Zara Council A, PA-C  Fluticasone-Salmeterol  (ADVAIR) 100-50 MCG/DOSE AEPB Inhale 1 puff into the lungs 2 (two) times daily. 02/08/19   Zara Council A, PA-C  furosemide (LASIX) 40 MG tablet Take 1 tablet (40 mg total) by mouth daily. 07/19/19   Lanae Boast, FNP  nicotine (NICODERM CQ - DOSED IN MG/24 HOURS) 14 mg/24hr patch Place 1 patch (14 mg total) onto the skin daily for 14 days. After 2 weeks, decrease dose to 7  Mg daily x 6 weeks then stop 12/04/19 12/18/19  Donzetta Starch, NP  omeprazole (PRILOSEC) 20 MG capsule Take 1 capsule (20 mg total) by mouth daily. 07/19/19   Lanae Boast, FNP  potassium chloride SA (KLOR-CON) 20 MEQ tablet Take 1 tablet (20 mEq total) by mouth daily. Patient taking differently: Take 20 mEq by mouth as needed (potassium).  02/08/19   McGowan, Hunt Oris, PA-C  promethazine (PHENERGAN) 25 MG tablet TAKE ONE TABLET BY MOUTH EVERY 6 HOURS AS NEEDED FOR NAUSEA OR VOMITING Patient taking differently: Take 25 mg by mouth every 6 (six) hours as needed for nausea or vomiting.  08/15/19   Iloabachie, Chioma E, NP  ranitidine (ZANTAC) 150 MG tablet Take 1 tablet (150 mg total) by mouth 2 (two) times daily. 09/08/17   Virginia Crews, MD  simvastatin (ZOCOR) 40 MG tablet Take 1 tablet (40 mg total) by mouth at bedtime. 12/03/19   Donzetta Starch, NP   Allergies  Allergen Reactions  . Coconut Oil Hives and Swelling    Just coconut ("all coconut")  . Amoxicillin Other (See Comments)    "makes me sick" "swelled up everywhere" Has patient had a PCN reaction causing immediate rash, facial/tongue/throat swelling, SOB or lightheadedness with hypotension: No Has patient had a PCN reaction causing severe rash involving mucus membranes or skin necrosis: No Has patient had a PCN reaction that required hospitalization: No Has patient had a PCN reaction occurring within the last 10 years: No If all of the above answers are "NO", then may proceed with Cephalosporin use.    Marland Kitchen Penicillins Other (See Comments)    "makes me  sick" "swelled up oil" Has patient had a PCN reaction causing immediate rash, facial/tongue/throat swelling, SOB or lightheadedness with hypotension: No Has patient had a PCN reaction causing severe rash involving mucus membranes or skin necrosis: No Has patient had a PCN reaction that required hospitalization: No Has patient had a PCN reaction occurring within the last 10 years: Yes If all of the above answers are "NO", then may proceed with Cephalosporin use.   Marland Kitchen Singulair [Montelukast Sodium] Rash    "made heart race"    FAMILY HISTORY:  family history includes COPD in her mother; Coronary artery disease in her brother; Diabetes in her child; Healthy in her child; Heart disease in her brother, father, and mother; Hyperlipidemia in her father and mother; Hypertension in her brother, father, and sister; Kidney disease in her sister. SOCIAL HISTORY:  reports that she has been smoking cigarettes. She started smoking about 2 years ago. She has a 16.50 pack-year smoking history. She has never used smokeless tobacco. She reports previous alcohol use. She reports that she does not use drugs.    Review of Systems:  Gen:  Denies  fever, sweats, chills weigh loss  HEENT: Denies blurred vision, double vision, ear pain, eye pain, hearing loss, nose bleeds, sore throat Cardiac:  No dizziness, chest pain or heaviness, chest tightness,edema, No JVD Resp:   No cough, -sputum production, +shortness of breath,-wheezing, -hemoptysis,  Gi: Denies swallowing difficulty, stomach pain, nausea or vomiting, diarrhea, constipation, bowel incontinence Gu:  Denies bladder incontinence, burning urine Ext:   Denies Joint pain, stiffness or swelling Skin: Denies  skin rash, easy bruising or bleeding or hives Endoc:  Denies polyuria, polydipsia , polyphagia or weight change Psych:   Denies depression, insomnia or hallucinations  Other:  All other systems negative    BP 132/78 (BP Location: Left Arm, Cuff Size:  Normal)   Pulse 78   Temp (!) 97.5 F (36.4 C) (Temporal)   Ht 5\' 1"  (1.549 m)   Wt 155 lb 9.6 oz (70.6 kg)   SpO2 100%   BMI 29.40 kg/m     Physical Examination:   GENERAL:NAD, no fevers, chills, no weakness no fatigue HEAD: Normocephalic, atraumatic.  EYES: PERLA, EOMI No scleral icterus.  MOUTH: Moist mucosal membrane.  EAR, NOSE, THROAT: Clear without exudates. No external lesions.  NECK: Supple.  PULMONARY: CTA B/L no wheezing, rhonchi, crackles CARDIOVASCULAR: S1 and S2. Regular rate and rhythm. No murmurs GASTROINTESTINAL: Soft, nontender, nondistended. Positive bowel sounds.  MUSCULOSKELETAL: No swelling, clubbing, or edema.  NEUROLOGIC: No gross focal neurological deficits. 5/5 strength all extremities SKIN: No ulceration, lesions, rashes, or cyanosis.  PSYCHIATRIC: Insight, judgment intact. -depression -anxiety ALL OTHER ROS ARE NEGATIVE   MEDICATIONS: I have reviewed all medications and confirmed regimen as documented    CULTURE RESULTS   Recent Results (from the past 240 hour(s))  Resp Panel by RT-PCR (Flu A&B, Covid) Nasopharyngeal Swab     Status: None   Collection Time: 11/30/19  3:15 PM   Specimen: Nasopharyngeal Swab; Nasopharyngeal(NP) swabs in vial transport medium  Result Value Ref Range Status   SARS Coronavirus 2 by RT PCR NEGATIVE NEGATIVE Final    Comment: (NOTE) SARS-CoV-2 target nucleic acids are NOT DETECTED.  The SARS-CoV-2 RNA is generally detectable in upper respiratory specimens during the acute phase of infection. The lowest concentration of SARS-CoV-2 viral copies this assay can detect is 138 copies/mL. A negative result does not preclude SARS-Cov-2 infection and should not be used as the sole basis for treatment or other patient management decisions. A negative result may occur with  improper specimen collection/handling, submission of specimen other than nasopharyngeal swab, presence of viral mutation(s) within the areas targeted  by this assay, and inadequate number of viral copies(<138 copies/mL). A negative result must be combined with clinical observations, patient history, and epidemiological information. The expected result is Negative.  Fact Sheet for Patients:  EntrepreneurPulse.com.au  Fact Sheet for Healthcare Providers:  IncredibleEmployment.be  This test is no t yet approved or cleared by the [place] FDA and  has been authorized for detection and/or diagnosis of SARS-CoV-2 by FDA under an Emergency Use Authorization (EUA). This EUA will remain  in effect (meaning this test can be used) for the duration of the COVID-19 declaration under Section 564(b)(1) of the Act, 21 U.S.C.section 360bbb-3(b)(1), unless the authorization is terminated  or revoked sooner.       Influenza A by PCR NEGATIVE NEGATIVE Final   Influenza B by PCR NEGATIVE NEGATIVE Final    Comment: (NOTE) The Xpert Xpress SARS-CoV-2/FLU/RSV plus assay is intended as an aid in the diagnosis of influenza from  Nasopharyngeal swab specimens and should not be used as a sole basis for treatment. Nasal washings and aspirates are unacceptable for Xpert Xpress SARS-CoV-2/FLU/RSV testing.  Fact Sheet for Patients: EntrepreneurPulse.com.au  Fact Sheet for Healthcare Providers: IncredibleEmployment.be  This test is not yet approved or cleared by the [place] FDA and has been authorized for detection and/or diagnosis of SARS-CoV-2 by FDA under an Emergency Use Authorization (EUA). This EUA will remain in effect (meaning this test can be used) for the duration of the COVID-19 declaration under Section 564(b)(1) of the Act, 21 U.S.C. section 360bbb-3(b)(1), unless the authorization is terminated or revoked.  Performed at [facility], [place] [address] [place], [place] [address]   MRSA PCR Screening     Status: None   Collection Time: 11/30/19  5:32 PM     Specimen: Nasopharyngeal  Result Value Ref Range Status   MRSA by PCR NEGATIVE NEGATIVE Final    Comment:        The GeneXpert MRSA Assay (FDA approved for NASAL specimens only), is one component of a comprehensive MRSA colonization surveillance program. It is not intended to diagnose MRSA infection nor to guide or monitor treatment for MRSA infections. Performed at [facility], [place] [address], [place], [place] [address]   Culture, blood (Routine X 2) w Reflex to ID Panel     Status: None (Preliminary result)   Collection Time: 12/03/19  6:00 PM   Specimen: BLOOD  Result Value Ref Range Status   Specimen Description BLOOD RIGHT ANTECUBITAL  Final   Special Requests   Final    BOTTLES DRAWN AEROBIC AND ANAEROBIC Blood Culture adequate volume   Culture   Final    NO GROWTH 3 DAYS Performed at [facility], [place] [address] [place], [place] [address]    Report Status PENDING  Incomplete  Culture, blood (Routine X 2) w Reflex to ID Panel     Status: None (Preliminary result)   Collection Time: 12/03/19  6:05 PM   Specimen: BLOOD  Result Value Ref Range Status   Specimen Description BLOOD LEFT ANTECUBITAL  Final   Special Requests   Final    BOTTLES DRAWN AEROBIC AND ANAEROBIC Blood Culture adequate volume   Culture   Final    NO GROWTH 3 DAYS Performed at [facility], [place] [address] [place], [place] [address]    Report Status PENDING  Incomplete             ASSESSMENT AND PLAN SYNOPSIS  49 year old white female with significant history for recurrent strokes in the setting of ongoing tobacco abuse with underlying reactive airways disease with asthma with multiple medical issues  Smoking cessation strongly advised  Asthma seems to be well-controlled at this time No evidence of wheezing Patient is to continue her Advair and albuterol as prescribed Avoid allergens Avoid secondhand smoke I would like to obtain pulmonary function testing however  with the recent diagnosis of stroke I do not want to put any strain on the patient so therefore will evaluate for pulmonary function testing in 6 months  Patient advised to stop smoking otherwise this will increase her chances for recurrent strokes in the future  COVID-19 EDUCATION: The signs and symptoms of COVID-19 were discussed with the patient and how to seek care for testing.  The importance of social distancing was discussed today. Hand Washing Techniques and avoid touching face was advised.     MEDICATION ADJUSTMENTS/LABS AND TESTS ORDERED:  Continue inhalers as prescribed  CURRENT MEDICATIONS REVIEWED AT LENGTH WITH PATIENT TODAY   Patient satisfied with Plan of action and management. All questions answered  Follow up in 6 months TOTAL TIME SPENT 47 mins   Shyler Holzman Patricia Pesa, M.D.  [facility]  Medical Director [place] Director [facility] Cardio-Pulmonary Department

## 2019-12-08 LAB — CULTURE, BLOOD (ROUTINE X 2)
Culture: NO GROWTH
Culture: NO GROWTH
Special Requests: ADEQUATE
Special Requests: ADEQUATE

## 2019-12-11 ENCOUNTER — Institutional Professional Consult (permissible substitution): Payer: Medicaid Other | Admitting: Internal Medicine

## 2019-12-12 ENCOUNTER — Encounter: Payer: Self-pay | Admitting: Neurology

## 2019-12-13 ENCOUNTER — Ambulatory Visit: Payer: Medicaid Other | Admitting: Family Medicine

## 2019-12-13 ENCOUNTER — Other Ambulatory Visit: Payer: Self-pay

## 2019-12-13 ENCOUNTER — Other Ambulatory Visit: Payer: Self-pay | Admitting: Family Medicine

## 2019-12-13 VITALS — BP 146/83 | HR 64 | Ht 61.0 in | Wt 158.7 lb

## 2019-12-13 DIAGNOSIS — Z889 Allergy status to unspecified drugs, medicaments and biological substances status: Secondary | ICD-10-CM

## 2019-12-13 DIAGNOSIS — J3089 Other allergic rhinitis: Secondary | ICD-10-CM

## 2019-12-13 DIAGNOSIS — R110 Nausea: Secondary | ICD-10-CM

## 2019-12-13 DIAGNOSIS — I10 Essential (primary) hypertension: Secondary | ICD-10-CM

## 2019-12-13 DIAGNOSIS — N184 Chronic kidney disease, stage 4 (severe): Secondary | ICD-10-CM

## 2019-12-13 DIAGNOSIS — M109 Gout, unspecified: Secondary | ICD-10-CM

## 2019-12-13 DIAGNOSIS — Z09 Encounter for follow-up examination after completed treatment for conditions other than malignant neoplasm: Secondary | ICD-10-CM

## 2019-12-13 DIAGNOSIS — E782 Mixed hyperlipidemia: Secondary | ICD-10-CM

## 2019-12-13 MED ORDER — FLUTICASONE PROPIONATE 50 MCG/ACT NA SUSP: NASAL | 0 refills | Status: DC

## 2019-12-13 MED ORDER — SIMVASTATIN 10 MG PO TABS: 10.0000 mg | ORAL_TABLET | Freq: Every day | ORAL | 0 refills | Status: DC

## 2019-12-13 MED ORDER — PROMETHAZINE HCL 25 MG PO TABS: ORAL_TABLET | ORAL | 0 refills | Status: DC

## 2019-12-13 MED ORDER — ALLOPURINOL 100 MG PO TABS: 100.0000 mg | ORAL_TABLET | Freq: Every day | ORAL | 11 refills | Status: DC

## 2019-12-13 NOTE — Progress Notes (Signed)
[name]   Progress Note: General Provider: Lanae Boast, FNP  SUBJECTIVE:   Gabrielle Schultz is a 49 y.o. female who  has a past medical history of Acid reflux, Allergic rhinitis (06/26/2015), Allergy, Asthma, Carpal tunnel syndrome, Chronic kidney disease, Colitis, CVA (cerebral infarction), Gout, History of syncope, Hypertension (2008), Low HDL (under 40) (06/26/2015), MI (myocardial infarction) ([place]), Migraines, Poor circulation, Renal insufficiency, Stroke ([place]), and Syncope and collapse.. Patient presents today for Follow-up (Had a stroke the day after Thanksgiving. This is a followup from her hospitalization.), Medication Refill (Allopurinol/Flonase/Phenergan), and Medication Problem (Zocor dosage?)   Patient states that she was admitted to the hospital on 11/30/2019 due to cryptogenic stroke. She is concerned about the recent increase of zocor from 10 to 40mg  due to her kidney function. She was started on plavix and amlodipine in the hospital. Patient continues to smoke and is not interested in quitting. She is requesting opthamology appt.  Review of Systems  Skin:       Bruising from IVs  All other systems reviewed and are negative.    OBJECTIVE: BP (!) 146/83 (BP Location: Right Arm, Patient Position: Sitting, Cuff Size: Normal)   Pulse 64   Ht 5\' 1"  (1.549 m)   Wt 158 lb 11.2 oz (72 kg)   SpO2 98%   BMI 29.99 kg/m   Wt Readings from Last 3 Encounters:  12/13/19 158 lb 11.2 oz (72 kg)  12/06/19 155 lb 9.6 oz (70.6 kg)  11/30/19 158 lb 15.2 oz (72.1 kg)     Physical Exam Constitutional:      General: She is not in acute distress.    Appearance: Normal appearance. She is obese. She is not ill-appearing.  HENT:     Head: Normocephalic and atraumatic.  Skin:    General: Skin is warm and dry.     Findings: No bruising (bilateral forearms. ).  Neurological:     General: No focal deficit present.     Mental Status: She is alert and oriented to person,  place, and time.     Gait: Gait normal.  Psychiatric:        Mood and Affect: Mood normal.        Behavior: Behavior normal.     ASSESSMENT/PLAN:   1. Allergic rhinitis due to other allergic trigger, unspecified seasonality - fluticasone (FLONASE) 50 MCG/ACT nasal spray; PLACE 1 SPRAY INTO BOTH NOSTRILS 2 TIMES A DAY  Dispense: 16 g; Refill: 0  2. Mixed hyperlipidemia - simvastatin (ZOCOR) 10 MG tablet; Take 1 tablet (10 mg total) by mouth daily.  Dispense: 90 tablet; Refill: 0  3. CKD (chronic kidney disease), stage IV ([place]) Followed by nephrology 4. Essential hypertension Continue with medications. Discussed smoking cessation.   5. History of seasonal allergies - fluticasone (FLONASE) 50 MCG/ACT nasal spray; PLACE 1 SPRAY INTO BOTH NOSTRILS 2 TIMES A DAY  Dispense: 16 g; Refill: 0  6. Gout, unspecified cause, unspecified chronicity, unspecified site - allopurinol (ZYLOPRIM) 100 MG tablet; Take 1 tablet (100 mg total) by mouth daily.  Dispense: 30 tablet; Refill: 11  7. Nausea - promethazine (PHENERGAN) 25 MG tablet; TAKE ONE TABLET BY MOUTH EVERY 6 HOURS AS NEEDED FOR NAUSEA OR VOMITING  Dispense: 15 tablet; Refill: 0  8. Hospital discharge follow-up Continue with medications. Discussed smoking cessation.  Reduced simvastatin to 10 mg due to CKD and medication compliance. She states that she will not take 40 mg.    Return in about  6 weeks (around 01/24/2020), or if symptoms worsen or fail to improve, for Opthamology appt needed.    The patient was given clear instructions to go to ER or return to medical center if symptoms do not improve, worsen or new problems develop. The patient verbalized understanding and agreed with plan of care.   [name], FNP-BC [place]

## 2019-12-13 NOTE — Patient Instructions (Signed)
 Hospital Discharge After a Stroke  Being discharged from the hospital after a stroke can feel overwhelming. Many things may be different, and it is normal to feel scared or anxious. Some stroke survivors may be able to return to their homes, and others may need more specialized care on a temporary or permanent basis. Your stroke care team will work with you to develop a discharge plan that is best for you. Ask questions if you do not understand something. Invite a friend or family member to participate in discharge planning. Understanding and following your discharge plan can help to prevent another stroke or other problems. Understanding your medicines After a stroke, your health care provider may prescribe one or more types of medicine. It is important to take medicines exactly as told by your health care provider. Serious harm, such as another stroke, can happen if you are unable to take your medicine exactly as prescribed. Make sure you understand:  What medicine to take.  Why you are taking the medicine.  How and when to take it.  If it can be taken with your other medicines and herbal supplements.  Possible side effects.  When to call your health care provider if you have any side effects.  How you will get and pay for your medicines. Medical assistance programs may be able to help you pay for prescription medicines if you cannot afford them. If you are taking an anticoagulant, be sure to take it exactly as told by your health care provider. This type of medicine can increase the risk of bleeding because it works to prevent blood from clotting. You may need to take certain precautions to prevent bleeding. You should contact your health care provider if you have:  Bleeding or bruising.  A fall or other injury to your head.  Blood in your urine or stool (feces). Planning for home safety  Take steps to prevent falls, such as installing grab bars or using a shower chair. Ask a  friend or family member to get needed things in place before you go home if possible. A therapist can come to your home to make recommendations for safety equipment. Ask your health care provider if you would benefit from this service or from home care. Getting needed equipment Ask your health care provider for a list of any medical equipment and supplies you will need at home. These may include items such as:  Walkers.  Canes.  Wheelchairs.  Hand-strengthening devices.  Special eating utensils. Medical equipment can be rented or purchased, depending on your insurance coverage. Check with your insurance company about what is covered. Keeping follow-up visits After a stroke, you will need to follow up regularly with a health care provider. You may also need rehabilitation, which can include physical therapy, occupational therapy, or speech-language therapy. Keeping these appointments is very important to your recovery after a stroke. Be sure to bring your medicine list and discharge papers with you to your appointments. If you need help to keep track of your schedule, use a calendar or appointment reminder. Preventing another stroke Having a stroke puts you at risk for another stroke in the future. Ask your health care provider what actions you can take to lower the risk. These may include:  Increasing how much you exercise.  Making a healthy eating plan.  Quitting smoking.  Managing other health conditions, such as high blood pressure, high cholesterol, or diabetes.  Limiting alcohol use. Knowing the warning signs of a stroke  Make   sure you understand the signs of a stroke. Before you leave the hospital, you will receive information outlining the stroke warning signs. Share these with your friends and family members. "BE FAST" is an easy way to remember the main warning signs of a stroke:  B - Balance. Signs are dizziness, sudden trouble walking, or loss of balance.  E - Eyes.  Signs are trouble seeing or a sudden change in vision.  F - Face. Signs are sudden weakness or numbness of the face, or the face or eyelid drooping on one side.  A - Arms. Signs are weakness or numbness in an arm. This happens suddenly and usually on one side of the body.  S - Speech. Signs are sudden trouble speaking, slurred speech, or trouble understanding what people say.  T - Time. Time to call emergency services. Write down what time symptoms started. Other signs of stroke may include:  A sudden, severe headache with no known cause.  Nausea or vomiting.  Seizure. These symptoms may represent a serious problem that is an emergency. Do not wait to see if the symptoms will go away. Get medical help right away. Call your local emergency services (911 in the [place] Do not drive yourself to the hospital. Make note of the time that you had your first symptoms. Your emergency responders or emergency room staff will need to know this information. Summary  Being discharged from the hospital after a stroke can feel overwhelming. It is normal to feel scared or anxious.  Make sure you take medicines exactly as told by your health care provider.  Know the warning signs of a stroke, and get help right way if you have any of these symptoms. "BE FAST" is an easy way to remember the main warning signs of a stroke. This information is not intended to replace advice given to you by your health care provider. Make sure you discuss any questions you have with your health care provider. Document Revised: 09/13/2018 Document Reviewed: 03/26/2016 Elsevier Patient Education  2020 Elsevier Inc.  

## 2019-12-27 ENCOUNTER — Ambulatory Visit: Payer: Medicaid Other

## 2019-12-27 ENCOUNTER — Ambulatory Visit: Payer: Self-pay | Admitting: Cardiovascular Disease

## 2019-12-27 NOTE — Progress Notes (Deleted)
Cardiology Office Note:   Date:  12/27/2019  NAME:  Gabrielle Schultz    MRN: [id number] DOB:  1970-05-08   PCP:  Langston Reusing, NP  Cardiologist:  No primary care provider on file.  Electrophysiologist:  None   Referring MD: Langston Reusing, NP   No chief complaint on file. ***  History of Present Illness:   Gabrielle Schultz is a 49 y.o. 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  Recently admitted to the hospital in November.  Had a stroke.  She has had multiple strokes in the past.  Multiple territories concerning for cardioembolic etiology.  Transesophageal echo shows retracted and thickened leaflets concerning for postinflammatory pathology.  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  Blood cultures 12/03/2019 -> Negative   Rheumatologic work up (2016) ANA negative Anti-jo negative Anti-centromere negative Ds-DNA negative SSA/SSB negative Scleroderma negative  Anti RNP negative   Problem List 1. CVA -R cortical infarct/chronic L MCA/PCA infarct -A1c 5.7 2. 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 HLD -T chol 155, HDL 24, LDL 97, TG 169 4. HTN 5. CKD -Cr 2.2 -EGFR 27 6. Tobacco abuse   Past Medical History: Past Medical History:  Diagnosis Date  . Acid reflux   . Allergic rhinitis 06/26/2015  . Allergy   . Asthma   . Carpal tunnel syndrome    right hand  . Chronic kidney disease   . Colitis   . CVA (cerebral infarction)    2009, 2015  . Gout   . History of syncope   . Hypertension 2008  . Low HDL (under 40) 06/26/2015  . MI (myocardial infarction) ([place])    2015 - patient wasnt told by cardiologist that she had MI but has had cardiac cath in past  . Migraines   . Poor circulation   . Renal insufficiency   . Stroke ([place])   . Syncope and collapse     Past Surgical History: Past  Surgical History:  Procedure Laterality Date  . BUBBLE STUDY  12/03/2019   Procedure: BUBBLE STUDY;  Surgeon: Geralynn Rile, MD;  Location: [facility];  Service: Cardiovascular;;  . CARDIAC CATHETERIZATION    . CESAREAN SECTION  1994   Dr. Ammie Dalton  . COLONOSCOPY WITH PROPOFOL N/A 06/26/2014   Procedure: COLONOSCOPY WITH PROPOFOL;  Surgeon: Josefine Class, MD;  Location: [facility] ENDOSCOPY;  Service: Endoscopy;  Laterality: N/A;  . DILATION AND CURETTAGE OF UTERUS  x2 for incomplete SABs  . TEE WITHOUT CARDIOVERSION N/A 12/03/2019   Procedure: TRANSESOPHAGEAL ECHOCARDIOGRAM (TEE);  Surgeon: Geralynn Rile, MD;  Location: [facility];  Service: Cardiovascular;  Laterality: N/A;    Current Medications: No outpatient medications have been marked as taking for the 12/27/19 encounter (Appointment) with O'Neal, Cassie Freer, MD.     Allergies:    Coconut oil, Amoxicillin, Penicillins, and Singulair [montelukast sodium]   Social History: Social History   Socioeconomic History  . Marital status: Married    Spouse name: Not on file  . Number of children: 2  . Years of education: 11th grade  . Highest education level: 11th grade  Occupational History  . Occupation: unemployed  Tobacco Use  . Smoking status: Current Every Day Smoker    Packs/day: 0.50    Years: 33.00    Pack years: 16.50    Types: Cigarettes    Start date: 03/04/2017  .  Smokeless tobacco: Never Used  . Tobacco comment: 3 cigs daily--12/06/2019  Vaping Use  . Vaping Use: Never used  Substance and Sexual Activity  . Alcohol use: Not Currently    Comment: "seldom" - drinks 350-771mL when she does drink  . Drug use: No  . Sexual activity: Yes    Partners: Male    Birth control/protection: None  Other Topics Concern  . Not on file  Social History Narrative   Husband is a Administrator for [employer] and is the sole provider for the family including 2 children (24 yr old and 28 yr old)    Goes  to food bank for food. Says "really worried" about water and light bill. Husband, baby, 71 year old, her, and mother all live together.    Patient would like appointment with [facility]       Pt is right handed   Social Determinants of Health   Financial Resource Strain: Not on file  Food Insecurity: Not on file  Transportation Needs: Not on file  Physical Activity: Not on file  Stress: Not on file  Social Connections: Not on file     Family History: The patient's ***family history includes COPD in her mother; Coronary artery disease in her brother; Diabetes in her child; Healthy in her child; Heart disease in her brother, father, and mother; Hyperlipidemia in her father and mother; Hypertension in her brother, father, and sister; Kidney disease in her sister.  ROS:   All other ROS reviewed and negative. Pertinent positives noted in the HPI.     EKGs/Labs/Other Studies Reviewed:   The following studies were personally reviewed by me today:  EKG:  EKG is *** ordered today.  The ekg ordered today demonstrates ***, and was personally reviewed by me.   TTE 12/03/2019  1. The aortic valve leaflets are thickened, calcified and retracted at  the leaflet tips. There is restricted leaflet motion in systole/diastole  (Type III dysfunction) with moderate regurgitation. There are no mobile  elements to the  calcifications/leaflet tips. The appearance is concerning for an  inflammatory condition such as libman's sacks endocarditis versus healed  vegetations. Clinical correlation is recommended in this patient with  stroke. There is central, moderate, aortic  regurgitation. PHT 582 ms, VC 0.46 cm, 2D ERO 0.19 cm2, R vol 29 cc. 3D  VCA 0.17 cm2. The LV chamber is small and not dilated, also suggestive of  moderate AI. Marland Kitchen The aortic valve is tricuspid. There is moderate  calcification of the aortic valve. There is  moderate thickening of the aortic valve. Aortic valve regurgitation is   moderate. Mild aortic valve sclerosis is present, with no evidence of  aortic valve stenosis.  2. Left ventricular ejection fraction, by estimation, is 65 to 70%. The  left ventricle has normal function.  3. Right ventricular systolic function is normal. The right ventricular  size is normal.  4. No left atrial/left atrial appendage thrombus was detected.  5. The mitral valve is grossly normal. Trivial mitral valve  regurgitation. No evidence of mitral stenosis.  6. Agitated saline contrast bubble study was negative, with no evidence  of any interatrial shunt.   Recent Labs: 11/30/2019: ALT 12 12/03/2019: BUN 25; Creatinine, Ser 2.20; Hemoglobin 12.8; Platelets 262; Potassium 3.4; Sodium 138   Recent Lipid Panel    Component Value Date/Time   CHOL 155 12/01/2019 0223   CHOL 177 09/20/2019 1912   CHOL 123 10/20/2012 0226   TRIG 169 (H) 12/01/2019 0223  TRIG 85 10/20/2012 0226   HDL 24 (L) 12/01/2019 0223   HDL 24 (L) 09/20/2019 1912   HDL 20 (L) 10/20/2012 0226   CHOLHDL 6.5 12/01/2019 0223   VLDL 34 12/01/2019 0223   VLDL 17 10/20/2012 0226   LDLCALC 97 12/01/2019 0223   LDLCALC 116 (H) 09/20/2019 1912   LDLCALC 86 10/20/2012 0226    Physical Exam:   VS:  There were no vitals taken for this visit.   Wt Readings from Last 3 Encounters:  12/13/19 158 lb 11.2 oz (72 kg)  12/06/19 155 lb 9.6 oz (70.6 kg)  11/30/19 158 lb 15.2 oz (72.1 kg)    General: Well nourished, well developed, in no acute distress Head: Atraumatic, normal size  Eyes: PEERLA, EOMI  Neck: Supple, no JVD Endocrine: No thryomegaly Cardiac: Normal S1, S2; RRR; no murmurs, rubs, or gallops Lungs: Clear to auscultation bilaterally, no wheezing, rhonchi or rales  Abd: Soft, nontender, no hepatomegaly  Ext: No edema, pulses 2+ Musculoskeletal: No deformities, BUE and BLE strength normal and equal Skin: Warm and dry, no rashes   Neuro: Alert and oriented to person, place, time, and situation,  CNII-XII grossly intact, no focal deficits  Psych: Normal mood and affect   ASSESSMENT:   Gabrielle Schultz is a 49 y.o. female who presents for the following: No diagnosis found.  PLAN:   There are no diagnoses linked to this encounter.  Disposition: No follow-ups on file.  Medication Adjustments/Labs and Tests Ordered: Current medicines are reviewed at length with the patient today.  Concerns regarding medicines are outlined above.  No orders of the defined types were placed in this encounter.  No orders of the defined types were placed in this encounter.   There are no Patient Instructions on file for this visit.   Time Spent with Patient: I have spent a total of *** minutes with patient reviewing hospital notes, telemetry, EKGs, labs and examining the patient as well as establishing an assessment and plan that was discussed with the patient.  > 50% of time was spent in direct patient care.  Signed, Addison Naegeli. Audie Box, [place]  [address], [place], [place] [address] [phone]  12/27/2019 7:20 AM

## 2019-12-30 NOTE — Progress Notes (Signed)
NEUROLOGY FOLLOW UP OFFICE NOTE  [name] [id number]   Subjective:  Gabrielle Schultz is a 49 year old right-handed female with HTN, CKD, COPD, tobacco use disorder, ischemic colitis and history of strokes whom I previously saw for right hand cramp presents today for recent stroke.  History supplemented by hospital records.  Current medications:  Plavix 75mg , clonidine, amlodipine, simvastatin 10mg  daily (her PCP doesn't want her on 40mg  because she has kidney disease).  She had a stroke at age 48.  She doesn't remember her symptoms.  She had a second stroke in addition to an acute ischemic colitis in 2014.  She does not remember her symptoms but records report right homonymous hemianopsia and right arm and leg weakness and numbness of 2 months duration.   MRI of brain without contrast from 12/29/2012 demonstrated "[R]emote moderate size left occipital/posterior inferior left temporal lobe infarct with small area of involvement of the inferior left parietal lobe.  Associated encephalomalacia and laminar necrosis.  No evidence of acute infarct.  Remote small left frontal lobe infarct.  Mild to moderate nonspecific white matter type changes.Marland KitchenMarland KitchenGlobal atrophy without hydrocephalus."  She reports that no cause for her strokes were ever found.  Following her stroke, she never underwent PT/OT because she had no insurance.  Since the second stroke, she continues to have aching and cramping of her hand down to her elbow with use of her hand.  She also has numbness involving the first 3 digits of her right hand.  She has history of carpal tunnel syndrome diagnosed many years ago, so she tried wearing a brace.  NCV-EMG of right upper extremity on 11/23/2018 was normal.   She was admitted to [facility] in November 2021 for a third stroke presenting with sudden left sided weakness.  Blood pressure was in the 749S systolic.  CT head was negative for bleed.  She received IV tPA.  MRI of brain showed  small acute posterior right frontal lobe infarct.  2D echo showed EF 65-70% with no source of embolus.  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  Blood cultures were negative.  [employer] Virus 2 negative, LDL 97, and Hgb A1c 5.7.  Hypercoagulable panel showed homocysteine 52 (thought to be secondary to smoking), antithrombin III 121, protein C 109, B2GPI negative, lupus anticoagulant panel negative and cardiolipin negative.  She was discharged on ASA and Plavix 75mg  daily for 3 weeks followed by Plavix alone.  Zocor was increased to 40mg  daily, however her PCP reduced dose due to her kidney disease.    Right now she is waiting to follow up with cardiologist Dr. Nehemiah Massed, whom she previously saw several years ago.  He had told her in the past that she has an aortic stenosis.  PAST MEDICAL HISTORY: Past Medical History:  Diagnosis Date  . Acid reflux   . Allergic rhinitis 06/26/2015  . Allergy   . Asthma   . Carpal tunnel syndrome    right hand  . Chronic kidney disease   . Colitis   . CVA (cerebral infarction)    2009, 2015  . Gout   . History of syncope   . Hypertension 2008  . Low HDL (under 40) 06/26/2015  . MI (myocardial infarction) ([place])    2015 - patient wasnt told by cardiologist that she had MI but has had cardiac cath in past  .  Migraines   . Poor circulation   . Renal insufficiency   . Stroke ([place])   . Syncope and collapse     MEDICATIONS: Current Outpatient Medications on File Prior to Visit  Medication Sig Dispense Refill  . albuterol (VENTOLIN HFA) 108 (90 Base) MCG/ACT inhaler INHALE 2 PUFFS EVERY 4 HOURS AS NEEDED FOR COUGH AND WHEEZING. (Patient taking differently: Inhale 2 puffs into the lungs every 4 (four) hours as needed for wheezing (cough).) 54 g 1  . allopurinol (ZYLOPRIM) 100 MG tablet Take 1 tablet (100 mg total) by mouth  daily. 30 tablet 11  . amLODipine (NORVASC) 10 MG tablet Take 1 tablet (10 mg total) by mouth daily. 30 tablet 2  . calcitRIOL (ROCALTROL) 0.25 MCG capsule Take 0.25 mcg by mouth daily.     . cetirizine (ZYRTEC) 10 MG tablet Take 1 tablet (10 mg total) by mouth daily. 90 tablet 0  . cloNIDine (CATAPRES) 0.2 MG tablet Take 1 tablet (0.2 mg total) by mouth 3 (three) times daily. 60 tablet 11  . clopidogrel (PLAVIX) 75 MG tablet Take 1 tablet (75 mg total) by mouth daily. 30 tablet 2  . fluticasone (FLONASE) 50 MCG/ACT nasal spray PLACE 1 SPRAY INTO BOTH NOSTRILS 2 TIMES A DAY 16 g 0  . Fluticasone-Salmeterol (ADVAIR) 100-50 MCG/DOSE AEPB Inhale 1 puff into the lungs 2 (two) times daily. 1 each 3  . furosemide (LASIX) 40 MG tablet Take 1 tablet (40 mg total) by mouth daily. 90 tablet 0  . omeprazole (PRILOSEC) 20 MG capsule Take 1 capsule (20 mg total) by mouth daily. 90 capsule 0  . potassium chloride SA (KLOR-CON) 20 MEQ tablet Take 1 tablet (20 mEq total) by mouth daily. (Patient taking differently: Take 20 mEq by mouth as needed (potassium).) 60 tablet 0  . promethazine (PHENERGAN) 25 MG tablet TAKE ONE TABLET BY MOUTH EVERY 6 HOURS AS NEEDED FOR NAUSEA OR VOMITING 15 tablet 0  . ranitidine (ZANTAC) 150 MG tablet Take 1 tablet (150 mg total) by mouth 2 (two) times daily. 60 tablet 3  . simvastatin (ZOCOR) 10 MG tablet Take 1 tablet (10 mg total) by mouth daily. 90 tablet 0   No current facility-administered medications on file prior to visit.    ALLERGIES: Allergies  Allergen Reactions  . Coconut Oil Hives and Swelling    Just coconut ("all coconut")  . Amoxicillin Other (See Comments)    "makes me sick" "swelled up everywhere" Has patient had a PCN reaction causing immediate rash, facial/tongue/throat swelling, SOB or lightheadedness with hypotension: No Has patient had a PCN reaction causing severe rash involving mucus membranes or skin necrosis: No Has patient had a PCN reaction that  required hospitalization: No Has patient had a PCN reaction occurring within the last 10 years: No If all of the above answers are "NO", then may proceed with Cephalosporin use.    Marland Kitchen Penicillins Other (See Comments)    "makes me sick" "swelled up oil" Has patient had a PCN reaction causing immediate rash, facial/tongue/throat swelling, SOB or lightheadedness with hypotension: No Has patient had a PCN reaction causing severe rash involving mucus membranes or skin necrosis: No Has patient had a PCN reaction that required hospitalization: No Has patient had a PCN reaction occurring within the last 10 years: Yes If all of the above answers are "NO", then may proceed with Cephalosporin use.   Marland Kitchen Singulair [Montelukast Sodium] Rash    "made heart race"    FAMILY HISTORY: Family History  Problem Relation Age of Onset  . Hyperlipidemia Mother   . Heart disease Mother        has a defibrillator  . COPD Mother   . Hypertension Father   . Heart disease Father   . Hyperlipidemia Father   . Hypertension Sister        died in her 49s from heart failure and end stage kidney disease  . Kidney disease Sister   . Hypertension Brother   . Heart disease Brother        has a defibrillator  . Coronary artery disease Brother        had CABG  . Healthy Child   . Diabetes Child     SOCIAL HISTORY: Social History   Socioeconomic History  . Marital status: Married    Spouse name: Not on file  . Number of children: 2  . Years of education: 11th grade  . Highest education level: 11th grade  Occupational History  . Occupation: unemployed  Tobacco Use  . Smoking status: Current Every Day Smoker    Packs/day: 0.50    Years: 33.00    Pack years: 16.50    Types: Cigarettes    Start date: 03/04/2017  . Smokeless tobacco: Never Used  . Tobacco comment: 3 cigs daily--12/06/2019  Vaping Use  . Vaping Use: Never used  Substance and Sexual Activity  . Alcohol use: Not Currently    Comment:  "seldom" - drinks 350-770mL when she does drink  . Drug use: No  . Sexual activity: Yes    Partners: Male    Birth control/protection: None  Other Topics Concern  . Not on file  Social History Narrative   Husband is a Administrator for [employer] and is the sole provider for the family including 2 children (37 yr old and 62 yr old)    Goes to food bank for food. Says "really worried" about water and light bill. Husband, baby, 64 year old, her, and mother all live together.    Patient would like appointment with [facility].       Pt is right handed   Social Determinants of Health   Financial Resource Strain: Not on file  Food Insecurity: Not on file  Transportation Needs: Not on file  Physical Activity: Not on file  Stress: Not on file  Social Connections: Not on file  Intimate Partner Violence: Not on file     Objective:  Blood pressure (!) 150/82, pulse 68, height 5\' 1"  (1.549 m), weight 159 lb 12.8 oz (72.5 kg), SpO2 92 %. General: No acute distress.  Patient appears well-groomed.   Head:  Normocephalic/atraumatic Eyes:  Fundi examined but not visualized Neck: supple, no paraspinal tenderness, full range of motion Heart:  Regular rate and rhythm Lungs:  Clear to auscultation bilaterally Back: No paraspinal tenderness Neurological Exam: alert and oriented to person, place, and time. Attention span and concentration intact, recent and remote memory intact, fund of knowledge intact.  Speech fluent and not dysarthric, language intact.  Right homonymous hemianopsia.  Slight flattening of right nasolabila fold.  Otherwise, CN II-XII intact. Bulk and tone normal, muscle strength 4/5 left upper extremity, otherwise  5/5 throughout.  Sensation to light touch, temperature and vibration intact.  Deep tendon reflexes 2+ throughout, toes downgoing.  Finger to nose and heel to shin testing intact.  Gait normal, Romberg negative.   Assessment/Plan:   1.  Right small cortical frontal infarct,  embolic of unknown source. 2.  History  of recurrent strokes 3.  Hypertension 4.  Hyperlipidemia 5.  Tobacco use disorder 6.  History of "irregular heartbeat".   1.  Secondary stroke prevention as managed by PCP: -  Plavix 75mg  daily -  Statin therapy.  LDL goal less than 70 -  Optimize blood pressure control -  Hgb A1c goal less than 7 2.  Will need to follow up with cardiology for further evaluation and treatment - does she have documented history of a fib.  If so, then she would need to be changed to anticoagulation.  If not, ideally would have implantable loop recorder (or at least 30 day cardiac event monitor if unable to afford/have insurance coverage).  Also evaluation of possible Libman Sacks endocarditis. 3.  Smoking cessation 4.  Mediterranean diet limited due to colitis 5.  Follow up in 6 months.  Metta Clines, DO  CC: Caryl Asp, NP

## 2019-12-31 ENCOUNTER — Ambulatory Visit (INDEPENDENT_AMBULATORY_CARE_PROVIDER_SITE_OTHER): Payer: Self-pay | Admitting: Neurology

## 2019-12-31 ENCOUNTER — Other Ambulatory Visit: Payer: Self-pay

## 2019-12-31 ENCOUNTER — Encounter: Payer: Self-pay | Admitting: Neurology

## 2019-12-31 VITALS — BP 150/82 | HR 68 | Ht 61.0 in | Wt 159.8 lb

## 2019-12-31 DIAGNOSIS — E785 Hyperlipidemia, unspecified: Secondary | ICD-10-CM

## 2019-12-31 DIAGNOSIS — I10 Essential (primary) hypertension: Secondary | ICD-10-CM

## 2019-12-31 DIAGNOSIS — H53461 Homonymous bilateral field defects, right side: Secondary | ICD-10-CM

## 2019-12-31 DIAGNOSIS — I639 Cerebral infarction, unspecified: Secondary | ICD-10-CM

## 2019-12-31 DIAGNOSIS — Z720 Tobacco use: Secondary | ICD-10-CM

## 2019-12-31 NOTE — Patient Instructions (Signed)
1.  Clopidogrel 75mg  daily 2.  Simvastatin 3.  Blood pressure medication 4.  Try to work on quitting smoking 5.  Follow up with Dr. Nehemiah Massed when possible 6.  Follow up in 6 months.

## 2020-01-08 ENCOUNTER — Ambulatory Visit: Payer: Medicaid Other

## 2020-01-10 ENCOUNTER — Other Ambulatory Visit: Payer: Medicaid Other

## 2020-01-10 ENCOUNTER — Other Ambulatory Visit: Payer: Self-pay

## 2020-01-10 DIAGNOSIS — M109 Gout, unspecified: Secondary | ICD-10-CM

## 2020-01-10 DIAGNOSIS — I10 Essential (primary) hypertension: Secondary | ICD-10-CM

## 2020-01-10 DIAGNOSIS — E782 Mixed hyperlipidemia: Secondary | ICD-10-CM

## 2020-01-10 DIAGNOSIS — I639 Cerebral infarction, unspecified: Secondary | ICD-10-CM

## 2020-01-10 DIAGNOSIS — N184 Chronic kidney disease, stage 4 (severe): Secondary | ICD-10-CM

## 2020-01-10 NOTE — Progress Notes (Signed)
Lab only 

## 2020-01-11 LAB — COMPREHENSIVE METABOLIC PANEL
ALT: 3 IU/L (ref 0–32)
AST: 10 IU/L (ref 0–40)
Albumin/Globulin Ratio: 1.3 (ref 1.2–2.2)
Albumin: 4.2 g/dL (ref 3.8–4.8)
Alkaline Phosphatase: 146 IU/L — ABNORMAL HIGH (ref 44–121)
BUN/Creatinine Ratio: 10 (ref 9–23)
BUN: 20 mg/dL (ref 6–24)
Bilirubin Total: 0.2 mg/dL (ref 0.0–1.2)
CO2: 22 mmol/L (ref 20–29)
Calcium: 9.7 mg/dL (ref 8.7–10.2)
Chloride: 104 mmol/L (ref 96–106)
Creatinine, Ser: 1.91 mg/dL — ABNORMAL HIGH (ref 0.57–1.00)
GFR calc Af Amer: 35 mL/min/{1.73_m2} — ABNORMAL LOW (ref >59)
GFR calc non Af Amer: 31 mL/min/{1.73_m2} — ABNORMAL LOW (ref >59)
Globulin, Total: 3.3 g/dL (ref 1.5–4.5)
Glucose: 89 mg/dL (ref 65–99)
Potassium: 4.5 mmol/L (ref 3.5–5.2)
Sodium: 141 mmol/L (ref 134–144)
Total Protein: 7.5 g/dL (ref 6.0–8.5)

## 2020-01-11 LAB — CBC WITH DIFFERENTIAL/PLATELET
Basophils Absolute: 0.1 10*3/uL (ref 0.0–0.2)
Basos: 1 %
EOS (ABSOLUTE): 0.3 10*3/uL (ref 0.0–0.4)
Eos: 5 %
Hematocrit: 39.1 % (ref 34.0–46.6)
Hemoglobin: 13.5 g/dL (ref 11.1–15.9)
Immature Grans (Abs): 0 10*3/uL (ref 0.0–0.1)
Immature Granulocytes: 0 %
Lymphocytes Absolute: 2.3 10*3/uL (ref 0.7–3.1)
Lymphs: 31 %
MCH: 33.3 pg — ABNORMAL HIGH (ref 26.6–33.0)
MCHC: 34.5 g/dL (ref 31.5–35.7)
MCV: 97 fL (ref 79–97)
Monocytes Absolute: 0.5 10*3/uL (ref 0.1–0.9)
Monocytes: 6 %
Neutrophils Absolute: 4.2 10*3/uL (ref 1.4–7.0)
Neutrophils: 57 %
Platelets: 301 10*3/uL (ref 150–450)
RBC: 4.05 x10E6/uL (ref 3.77–5.28)
RDW: 15.7 % — ABNORMAL HIGH (ref 11.7–15.4)
WBC: 7.4 10*3/uL (ref 3.4–10.8)

## 2020-01-11 LAB — LIPID PANEL
Chol/HDL Ratio: 5.5 ratio — ABNORMAL HIGH (ref 0.0–4.4)
Cholesterol, Total: 188 mg/dL (ref 100–199)
HDL: 34 mg/dL — ABNORMAL LOW (ref >39)
LDL Chol Calc (NIH): 126 mg/dL — ABNORMAL HIGH (ref 0–99)
Triglycerides: 156 mg/dL — ABNORMAL HIGH (ref 0–149)
VLDL Cholesterol Cal: 28 mg/dL (ref 5–40)

## 2020-01-11 LAB — HEMOGLOBIN A1C
Est. average glucose Bld gHb Est-mCnc: 114 mg/dL
Hgb A1c MFr Bld: 5.6 % (ref 4.8–5.6)

## 2020-01-11 LAB — URIC ACID: Uric Acid: 5.3 mg/dL (ref 2.6–6.2)

## 2020-01-15 ENCOUNTER — Other Ambulatory Visit: Payer: Self-pay | Admitting: Gerontology

## 2020-01-16 ENCOUNTER — Telehealth: Payer: Self-pay | Admitting: Pharmacist

## 2020-01-16 NOTE — Telephone Encounter (Signed)
01/16/2020 10:59:10 AM - ProAir pending  -- Elmer Picker - Wednesday, January 16, 2020 10:57 AM -- I have received the signed provider portion of [employer] application for ProAir(replacing Ventolin)--has previously mailed to patient 12/26/2019--I have reprinted patient portion and put in bag with medications on the wall--also letter to give to patient--need current 2022 income.  01/16/2020 10:46:43 AM - Advair script to [place] for refilll  -- Elmer Picker - Wednesday, January 16, 2020 10:45 AM --Faxed Advair 100/50 (Inhale 1 puff into the lungs two times a day ) script for refill.

## 2020-01-17 ENCOUNTER — Ambulatory Visit: Payer: Medicaid Other | Admitting: Gerontology

## 2020-01-17 ENCOUNTER — Other Ambulatory Visit: Payer: Self-pay

## 2020-01-17 VITALS — BP 132/73 | HR 59 | Ht 61.0 in | Wt 159.3 lb

## 2020-01-17 DIAGNOSIS — I10 Essential (primary) hypertension: Secondary | ICD-10-CM

## 2020-01-17 DIAGNOSIS — H53451 Other localized visual field defect, right eye: Secondary | ICD-10-CM

## 2020-01-17 DIAGNOSIS — G252 Other specified forms of tremor: Secondary | ICD-10-CM

## 2020-01-17 MED ORDER — CLONIDINE HCL 0.3 MG PO TABS: 0.3000 mg | ORAL_TABLET | Freq: Three times a day (TID) | ORAL | 0 refills | Status: DC

## 2020-01-17 NOTE — Progress Notes (Signed)
Established Patient Office Visit  Subjective:  Patient ID: Gabrielle Schultz, female    DOB: [birth year]  Age: 50 y.o. MRN: [id number] HPI Gabrielle Schultz is a 50 year old female with HTN, CKD, COPD, tobacco use disorder, ischemic colitis, hyperlipidemia, Hyperparathyroidism due to renal insufficiency ([place]), and history of recurrent strokes. Patient presents for bilateral hand tremors  from clonidine dose reduction and lab review.  Labs results from 01/10/2020 were as follows;  Hgb A1c 5.6%, Uric acid 5.3 mg/dl, GFR 35, and lipid profile improved from 4 months ago Triglyceride 156 mg/dl, HDL 34 mg/dl, LDL 126 mg/dl. She states that she is watching her diet now and eating more vegetables. She reports that she saw nephrology in May 2021 and she will make an appt to see them this year. She saw Dr. Anthonette Legato at [place] on 05/25/2019 for follow-up of stage 3b CKD and HTN the report states her kidney disease is stable (EGFR 30), she was started on calcitrol for Hyperparathyroidism due to renal insufficiency ([place]). She denies weakness, joint pain, loss of appetite, or trouble concentrating.  She states that she lost her right peripheral vision after her stroke in 2014, and now her vision has diminished significantly since her last stroke in Nov 2021, and she is asking if she can see ophthalmology. She reports hand tremors since early Dec 2021 due to clonidine dose decreased from 0.$RemoveBeforeDE'3mg'dmPcSDdXJNtTRMF$  to 0.2 mg. She states that she can't stand the shakes anymore and wants to return to 0.3 mg.  She endorses not checking her blood pressure at home and adheres to medication regimen She has no other complaints. Overall, she states that she's doing well and offers no further complaint.   Past Medical History:  Diagnosis Date  . Acid reflux   . Allergic rhinitis 06/26/2015  . Allergy   . Asthma   . Carpal tunnel syndrome    right hand  . Chronic kidney disease   . Colitis   . CVA (cerebral  infarction)    2009, 2015  . Gout   . History of syncope   . Hypertension 2008  . Low HDL (under 40) 06/26/2015  . MI (myocardial infarction) ([place])    2015 - patient wasnt told by cardiologist that she had MI but has had cardiac cath in past  . Migraines   . Poor circulation   . Renal insufficiency   . Stroke ([place])   . Syncope and collapse     Past Surgical History:  Procedure Laterality Date  . BUBBLE STUDY  12/03/2019   Procedure: BUBBLE STUDY;  Surgeon: Geralynn Rile, MD;  Location: [place];  Service: Cardiovascular;;  . CARDIAC CATHETERIZATION    . CESAREAN SECTION  1994   Dr. Ammie Dalton  . COLONOSCOPY WITH PROPOFOL N/A 06/26/2014   Procedure: COLONOSCOPY WITH PROPOFOL;  Surgeon: Josefine Class, MD;  Location: [facility] ENDOSCOPY;  Service: Endoscopy;  Laterality: N/A;  . DILATION AND CURETTAGE OF UTERUS  x2 for incomplete SABs  . TEE WITHOUT CARDIOVERSION N/A 12/03/2019   Procedure: TRANSESOPHAGEAL ECHOCARDIOGRAM (TEE);  Surgeon: Geralynn Rile, MD;  Location[facility] ENDOSCOPY;  Service: Cardiovascular;  Laterality: N/A;    Family History  Problem Relation Age of Onset  . Hyperlipidemia Mother   . Heart disease Mother        has a defibrillator  . COPD Mother   . Hypertension Father   . Heart disease Father   . Hyperlipidemia Father   .  Hypertension Sister        died in her 104s from heart failure and end stage kidney disease  . Kidney disease Sister   . Hypertension Brother   . Heart disease Brother        has a defibrillator  . Coronary artery disease Brother        had CABG  . Healthy Child   . Diabetes Child     Social History   Socioeconomic History  . Marital status: Married    Spouse name: Not on file  . Number of children: 2  . Years of education: 11th grade  . Highest education level: 11th grade  Occupational History  . Occupation: unemployed  Tobacco Use  . Smoking status: Current Every Day Smoker    Packs/day: 0.50    Years:  33.00    Pack years: 16.50    Types: Cigarettes    Start date: 03/04/2017  . Smokeless tobacco: Never Used  . Tobacco comment: 3 cigs daily--12/06/2019  Vaping Use  . Vaping Use: Never used  Substance and Sexual Activity  . Alcohol use: Not Currently    Comment: "seldom" - drinks 350-731mL when she does drink  . Drug use: No  . Sexual activity: Yes    Partners: Male    Birth control/protection: None  Other Topics Concern  . Not on file  Social History Narrative   Husband is a Administrator for [employer] and is the sole provider for the family including 2 children (70 yr old and 27 yr old)    Goes to food bank for food. Says "really worried" about water and light bill. Husband, baby, 53 year old, her, and mother all live together.    Patient would like appointment with [facility].       Pt is right handed   Social Determinants of Health   Financial Resource Strain: Not on file  Food Insecurity: Not on file  Transportation Needs: Not on file  Physical Activity: Not on file  Stress: Not on file  Social Connections: Not on file  Intimate Partner Violence: Not on file    Outpatient Medications Prior to Visit  Medication Sig Dispense Refill  . albuterol (VENTOLIN HFA) 108 (90 Base) MCG/ACT inhaler INHALE 2 PUFFS EVERY 4 HOURS AS NEEDED FOR COUGH AND WHEEZING. (Patient taking differently: Inhale 2 puffs into the lungs every 4 (four) hours as needed for wheezing (cough).) 54 g 1  . allopurinol (ZYLOPRIM) 100 MG tablet Take 1 tablet (100 mg total) by mouth daily. 30 tablet 11  . amLODipine (NORVASC) 10 MG tablet Take 1 tablet (10 mg total) by mouth daily. 30 tablet 2  . calcitRIOL (ROCALTROL) 0.25 MCG capsule Take 0.25 mcg by mouth daily.     . cetirizine (ZYRTEC) 10 MG tablet Take 1 tablet (10 mg total) by mouth daily. 90 tablet 0  . clopidogrel (PLAVIX) 75 MG tablet Take 1 tablet (75 mg total) by mouth daily. 30 tablet 2  . fluticasone (FLONASE) 50 MCG/ACT nasal spray PLACE 1 SPRAY  INTO BOTH NOSTRILS 2 TIMES A DAY 16 g 0  . Fluticasone-Salmeterol (ADVAIR) 100-50 MCG/DOSE AEPB Inhale 1 puff into the lungs 2 (two) times daily. 1 each 3  . furosemide (LASIX) 40 MG tablet Take 1 tablet (40 mg total) by mouth daily. 90 tablet 0  . omeprazole (PRILOSEC) 20 MG capsule Take 1 capsule (20 mg total) by mouth daily. 90 capsule 0  . potassium chloride SA (KLOR-CON) 20 MEQ  tablet Take 1 tablet (20 mEq total) by mouth daily. (Patient taking differently: Take 20 mEq by mouth as needed (potassium).) 60 tablet 0  . promethazine (PHENERGAN) 25 MG tablet TAKE ONE TABLET BY MOUTH EVERY 6 HOURS AS NEEDED FOR NAUSEA OR VOMITING 15 tablet 0  . ranitidine (ZANTAC) 150 MG tablet Take 1 tablet (150 mg total) by mouth 2 (two) times daily. 60 tablet 3  . simvastatin (ZOCOR) 10 MG tablet Take 1 tablet (10 mg total) by mouth daily. 90 tablet 0  . cloNIDine (CATAPRES) 0.2 MG tablet Take 1 tablet (0.2 mg total) by mouth 3 (three) times daily. 60 tablet 11   No facility-administered medications prior to visit.    Allergies  Allergen Reactions  . Coconut Oil Hives and Swelling    Just coconut ("all coconut")  . Amoxicillin Other (See Comments)    "makes me sick" "swelled up everywhere" Has patient had a PCN reaction causing immediate rash, facial/tongue/throat swelling, SOB or lightheadedness with hypotension: No Has patient had a PCN reaction causing severe rash involving mucus membranes or skin necrosis: No Has patient had a PCN reaction that required hospitalization: No Has patient had a PCN reaction occurring within the last 10 years: No If all of the above answers are "NO", then may proceed with Cephalosporin use.    Marland Kitchen Penicillins Other (See Comments)    "makes me sick" "swelled up oil" Has patient had a PCN reaction causing immediate rash, facial/tongue/throat swelling, SOB or lightheadedness with hypotension: No Has patient had a PCN reaction causing severe rash involving mucus membranes or  skin necrosis: No Has patient had a PCN reaction that required hospitalization: No Has patient had a PCN reaction occurring within the last 10 years: Yes If all of the above answers are "NO", then may proceed with Cephalosporin use.   Marland Kitchen Singulair [Montelukast Sodium] Rash    "made heart race"    ROS Review of Systems  Constitutional: Negative.   Eyes: Positive for visual disturbance.  Respiratory: Negative.   Cardiovascular: Negative.   Gastrointestinal: Negative.   Endocrine: Negative.   Genitourinary: Negative.   Musculoskeletal: Negative.   Skin: Negative.   Neurological: Negative.   Hematological: Negative.   Psychiatric/Behavioral: Negative.       Objective:    Physical Exam Constitutional:      Appearance: Normal appearance.  Eyes:     Comments: Right peripheral visual field loss   Cardiovascular:     Rate and Rhythm: Normal rate and regular rhythm.     Heart sounds: Normal heart sounds.  Pulmonary:     Effort: Pulmonary effort is normal.     Breath sounds: Normal breath sounds.  Abdominal:     General: Bowel sounds are normal.     Palpations: Abdomen is soft.  Musculoskeletal:        General: Normal range of motion.     Cervical back: Normal range of motion.  Skin:    General: Skin is warm.  Neurological:     General: No focal deficit present.     Mental Status: She is oriented to person, place, and time.  Psychiatric:        Mood and Affect: Mood normal.        Behavior: Behavior normal.        Thought Content: Thought content normal.        Judgment: Judgment normal.     BP 132/73   Pulse (!) 59   Ht $R'5\' 1"'tY$  (1.549 m)  Wt 159 lb 4.8 oz (72.3 kg)   SpO2 99%   BMI 30.10 kg/m  Wt Readings from Last 3 Encounters:  01/17/20 159 lb 4.8 oz (72.3 kg)  12/31/19 159 lb 12.8 oz (72.5 kg)  12/13/19 158 lb 11.2 oz (72 kg)     Health Maintenance Due  Topic Date Due  . PNEUMOCOCCAL POLYSACCHARIDE VACCINE AGE 37-64 HIGH RISK  Never done  . COVID-19  Vaccine (1) Never done  . FOOT EXAM  Never done  . OPHTHALMOLOGY EXAM  Never done  . TETANUS/TDAP  Never done  . PAP SMEAR-Modifier  Never done  . INFLUENZA VACCINE  Never done    There are no preventive care reminders to display for this patient.  Lab Results  Component Value Date   TSH 1.750 12/15/2017   Lab Results  Component Value Date   WBC 7.4 01/10/2020   HGB 13.5 01/10/2020   HCT 39.1 01/10/2020   MCV 97 01/10/2020   PLT 301 01/10/2020   Lab Results  Component Value Date   NA 141 01/10/2020   K 4.5 01/10/2020   CO2 22 01/10/2020   GLUCOSE 89 01/10/2020   BUN 20 01/10/2020   CREATININE 1.91 (H) 01/10/2020   BILITOT 0.2 01/10/2020   ALKPHOS 146 (H) 01/10/2020   AST 10 01/10/2020   ALT 3 01/10/2020   PROT 7.5 01/10/2020   ALBUMIN 4.2 01/10/2020   CALCIUM 9.7 01/10/2020   ANIONGAP 14 12/03/2019   Lab Results  Component Value Date   CHOL 188 01/10/2020   Lab Results  Component Value Date   HDL 34 (L) 01/10/2020   Lab Results  Component Value Date   LDLCALC 126 (H) 01/10/2020   Lab Results  Component Value Date   TRIG 156 (H) 01/10/2020   Lab Results  Component Value Date   CHOLHDL 5.5 (H) 01/10/2020   Lab Results  Component Value Date   HGBA1C 5.6 01/10/2020      Assessment & Plan:   1. Primary hypertension -Continue current medications and clonidine dose increased to 0.$RemoveBefore'3mg'MlwaIRzViUrwz$ .  - cloNIDine (CATAPRES) 0.3 MG tablet; Take 1 tablet (0.3 mg total) by mouth 3 (three) times daily.  Dispense: 90 tablet; Refill: 0 -Continiue DASH diet and daily exercise as tolerated. -Advised to check blood pressure once or twice a week and to bring log with next appointment 2. Decreased peripheral vision of right eye - Ambulatory referral to Ophthalmology  3. Coarse hand tremors secondary to clonidine reduction - cloNIDine dose increased from 0.$RemoveBeforeDE'2mg'OlnmKtaHgzylODS$  back to her previous dose 0.3 MG  -Contact the clinic if symptoms not improve in the next few days.  Follow-up:  Return in about 4 weeks (around 02/14/2020), or if symptoms worsen or fail to improve.    Wolfgang Phoenix, NP

## 2020-01-17 NOTE — Patient Instructions (Signed)
Stroke Prevention Some medical conditions and lifestyle choices can lead to a higher risk for a stroke. You can help to prevent a stroke by eating healthy foods and exercising. It also helps to not smoke and to manage any health problems you may have. How can this condition affect me? A stroke is an emergency. It should be treated right away. A stroke can lead to brain damage or threaten your life. There is a better chance of surviving and getting better after a stroke if you get medical help right away. What can increase my risk? The following medical conditions may increase your risk of a stroke:  Diseases of the heart and blood vessels (cardiovascular disease).  High blood pressure (hypertension).  Diabetes.  High cholesterol.  Sickle cell disease.  Problems with blood clotting.  Being very overweight.  Sleeping problems (obstructivesleep apnea). Other risk factors include:  Being older than age 60.  A history of blood clots, stroke, or mini-stroke (TIA).  Race, ethnic background, or a family history of stroke.  Smoking or using tobacco products.  Taking birth control pills, especially if you smoke.  Heavy alcohol and drug use.  Not being active. What actions can I take to prevent this? Manage your health conditions  High cholesterol. ? Eat a healthy diet. If this is not enough to manage your cholesterol, you may need to take medicines. ? Take medicines as told by your doctor.  High blood pressure. ? Try to keep your blood pressure below 130/80. ? If your blood pressure cannot be managed through a healthy diet and regular exercise, you may need to take medicines. ? Take medicines as told by your doctor. ? Ask your doctor if you should check your blood pressure at home. ? Have your blood pressure checked every year.  Diabetes. ? Eat a healthy diet and get regular exercise. If your blood sugar (glucose) cannot be managed through diet and exercise, you may need to  take medicines. ? Take medicines as told by your doctor.  Talk to your doctor about getting checked for sleeping problems. Signs of a problem can include: ? Snoring a lot. ? Feeling very tired.  Make sure that you manage any other conditions you have. Nutrition  Follow instructions from your doctor about what to eat or drink. You may be told to: ? Eat and drink fewer calories each day. ? Limit how much salt (sodium) you use to 1,500 milligrams (mg) each day. ? Use only healthy fats for cooking, such as olive oil, canola oil, and sunflower oil. ? Eat healthy foods. To do this:  Choose foods that are high in fiber. These include whole grains, and fresh fruits and vegetables.  Eat at least 5 servings of fruits and vegetables a day. Try to fill one-half of your plate with fruits and vegetables at each meal.  Choose low-fat (lean) proteins. These include low-fat cuts of meat, chicken without skin, fish, tofu, beans, and nuts.  Eat low-fat dairy products. ? Avoid foods that:  Are high in salt.  Have saturated fat.  Have trans fat.  Have cholesterol.  Are processed or pre-made. ? Count how many carbohydrates you eat and drink each day.   Lifestyle  If you drink alcohol: ? Limit how much you have to:  0-1 drink a day for women who are not pregnant.  0-2 drinks a day for men. ? Know how much alcohol is in your drink. In the [place] one drink equals one 12 oz bottle   of beer (370mL), one 5 oz glass of wine (116mL), or one 1 oz glass of hard liquor (48mL).  Do not smoke or use any products that have nicotine or tobacco. If you need help quitting, ask your doctor.  Avoid secondhand smoke.  Do not use drugs. Activity  Try to stay at a healthy weight.  Get at least 30 minutes of exercise on most days, such as: ? Fast walking. ? Biking. ? Swimming.   Medicines  Take over-the-counter and prescription medicines only as told by your doctor.  Avoid taking birth control pills.  Talk to your doctor about the risks of taking birth control pills if: ? You are over 78 years old. ? You smoke. ? You get very bad headaches. ? You have had a blood clot. Where to find more information  American Stroke Association: www.strokeassociation.org Get help right away if:  You or a loved one has any signs of a stroke. "BE FAST" is an easy way to remember the warning signs: ? B - Balance. Dizziness, sudden trouble walking, or loss of balance. ? E - Eyes. Trouble seeing or a change in how you see. ? F - Face. Sudden weakness or loss of feeling of the face. The face or eyelid may droop on one side. ? A - Arms. Weakness or loss of feeling in an arm. This happens all of a sudden and most often on one side of the body. ? S - Speech. Sudden trouble speaking, slurred speech, or trouble understanding what people say. ? T - Time. Time to call emergency services. Write down what time symptoms started.  You or a loved one has other signs of a stroke, such as: ? A sudden, very bad headache with no known cause. ? Feeling like you may vomit (nausea). ? Vomiting. ? A seizure. These symptoms may be an emergency. Get help right away. Call your local emergency services (911 in the [place]  Do not wait to see if the symptoms will go away.  Do not drive yourself to the hospital. Summary  You can help to prevent a stroke by eating healthy, exercising, and not smoking. It also helps to manage any health problems you have.  Do not smoke or use any products that contain nicotine or tobacco.  Get help right away if you or a loved one has any signs of a stroke. This information is not intended to replace advice given to you by your health care provider. Make sure you discuss any questions you have with your health care provider. Document Revised: 07/23/2019 Document Reviewed: 07/23/2019 Elsevier Patient Education  2021 [place] With Less [employer] with less salt is one way to  reduce the amount of sodium you get from food. Sodium is one of the elements that make up salt. It is found naturally in foods and is also added to certain foods. Depending on your condition and overall health, your health care provider or dietitian may recommend that you reduce your sodium intake. Most people should have less than 2,300 milligrams (mg) of sodium each day. If you have high blood pressure (hypertension), you may need to limit your sodium to 1,500 mg each day. Follow the tips below to help reduce your sodium intake. What are tips for eating less sodium? Reading food labels  Check the food label before buying or using packaged ingredients. Always check the label for the serving size and sodium content.  Look for products with no more than 140  mg of sodium in one serving.  Check the % Daily Value column to see what percent of the daily recommended amount of sodium is provided in one serving of the product. Foods with 5% or less in this column are considered low in sodium. Foods with 20% or higher are considered high in sodium.  Do not choose foods with salt as one of the first three ingredients on the ingredients list. If salt is one of the first three ingredients, it usually means the item is high in sodium.   Shopping  Buy sodium-free or low-sodium products. Look for the following words on food labels: ? Low-sodium. ? Sodium-free. ? Reduced-sodium. ? No salt added. ? Unsalted.  Always check the sodium content even if foods are labeled as low-sodium or no salt added.  Buy fresh foods. Cooking  Use herbs, seasonings without salt, and spices as substitutes for salt.  Use sodium-free baking soda when baking.  Grill, braise, or roast foods to add flavor with less salt.  Avoid adding salt to pasta, rice, or hot cereals.  Drain and rinse canned vegetables, beans, and meat before use.  Avoid adding salt when cooking sweets and desserts.  Cook with low-sodium  ingredients. What foods are high in sodium? Vegetables Regular canned vegetables (not low-sodium or reduced-sodium). Sauerkraut, pickled vegetables, and relishes. Olives. [place] fries. Onion rings. Regular canned tomato sauce and paste. Regular tomato and vegetable juice. Frozen vegetables in sauces. Grains Instant hot cereals. Bread stuffing, pancake, and biscuit mixes. Croutons. Seasoned rice or pasta mixes. Noodle soup cups. Boxed or frozen macaroni and cheese. Regular salted crackers. Self-rising flour. Rolls. Bagels. Flour tortillas and wraps. Meats and other proteins Meat or fish that is salted, canned, smoked, cured, spiced, or pickled. This includes bacon, ham, sausages, hot dogs, corned beef, chipped beef, meat loaves, salt pork, jerky, pickled herring, anchovies, regular canned tuna, and sardines. Salted nuts. Dairy Processed cheese and cheese spreads. Cheese curds. Blue cheese. Feta cheese. String cheese. Regular cottage cheese. Buttermilk. Canned milk. The items listed above may not be a complete list of foods high in sodium. Actual amounts of sodium may be different depending on processing. Contact a dietitian for more information. What foods are low in sodium? Fruits Fresh, frozen, or canned fruit with no sauce added. Fruit juice. Vegetables Fresh or frozen vegetables with no sauce added. "No salt added" canned vegetables. "No salt added" tomato sauce and paste. Low-sodium or reduced-sodium tomato and vegetable juice. Grains Noodles, pasta, quinoa, rice. Shredded or puffed wheat or puffed rice. Regular or quick oats (not instant). Low-sodium crackers. Low-sodium bread. Whole-grain bread and whole-grain pasta. Unsalted popcorn. Meats and other proteins Fresh or frozen whole meats, poultry (not injected with sodium), and fish with no sauce added. Unsalted nuts. Dried peas, beans, and lentils without added salt. Unsalted canned beans. Eggs. Unsalted nut butters. Low-sodium canned tuna  or chicken. Dairy Milk. Soy milk. Yogurt. Low-sodium cheeses, such as Swiss, Monterey Jack, [place], and [employer]. Sherbet or ice cream (keep to  cup per serving). Cream cheese. Fats and oils Unsalted butter or margarine. Other foods Homemade pudding. Sodium-free baking soda and baking powder. Herbs and spices. Low-sodium seasoning mixes. Beverages Coffee and tea. Carbonated beverages. The items listed above may not be a complete list of foods low in sodium. Actual amounts of sodium may be different depending on processing. Contact a dietitian for more information. What are some salt alternatives when cooking? The following are herbs, seasonings, and spices that can be used  instead of salt to flavor your food. Herbs should be fresh or dried. Do not choose packaged mixes. Next to the name of the herb, spice, or seasoning are some examples of foods you can pair it with. Herbs  Bay leaves - Soups, meat and vegetable dishes, and spaghetti sauce.  Basil - [employer], soups, pasta, and fish dishes.  Cilantro - Meat, poultry, and vegetable dishes.  Chili powder - Marinades and Mexican dishes.  Chives - Salad dressings and potato dishes.  Cumin - Mexican dishes, couscous, and meat dishes.  Dill - Fish dishes, sauces, and salads.  Fennel - Meat and vegetable dishes, breads, and cookies.  Garlic (do not use garlic salt) - [place] dishes, meat dishes, salad dressings, and sauces.  Marjoram - Soups, potato dishes, and meat dishes.  Oregano - Pizza and spaghetti sauce.  Parsley - Salads, soups, pasta, and meat dishes.  Rosemary - [place] dishes, salad dressings, soups, and red meats.  Saffron - Fish dishes, pasta, and some poultry dishes.  Sage - Stuffings and sauces.  Tarragon - Fish and [employer].  Thyme - Stuffing, meat, and fish dishes. Seasonings  Lemon juice - Fish dishes, poultry dishes, vegetables, and salads.  Vinegar - Salad dressings, vegetables, and fish  dishes. Spices  Cinnamon - Sweet dishes, such as cakes, cookies, and puddings.  Cloves - Gingerbread, puddings, and marinades for meats.  Curry - Vegetable dishes, fish and poultry dishes, and stir-fry dishes.  Ginger - Vegetable dishes, fish dishes, and stir-fry dishes.  Nutmeg - Pasta, vegetables, poultry, fish dishes, and custard. Summary  Cooking with less salt is one way to reduce the amount of sodium that you get from food.  Buy sodium-free or low-sodium products.  Check the food label before using or buying packaged ingredients.  Use herbs, seasonings without salt, and spices as substitutes for salt in foods. This information is not intended to replace advice given to you by your health care provider. Make sure you discuss any questions you have with your health care provider. Document Revised: 12/13/2018 Document Reviewed: 12/13/2018 Elsevier Patient Education  [place].

## 2020-01-22 ENCOUNTER — Telehealth: Payer: Self-pay | Admitting: Pharmacist

## 2020-01-22 NOTE — Telephone Encounter (Signed)
01/22/2020 12:16:03 PM - [employer] HFA faxed to [place] - Tuesday, January 22, 2020 12:15 PM --Gabrielle Schultz [place] application for [facility] for enrollment (replaces Ventolin)

## 2020-01-24 ENCOUNTER — Other Ambulatory Visit: Payer: Medicaid Other

## 2020-02-04 ENCOUNTER — Other Ambulatory Visit: Payer: Self-pay | Admitting: Nephrology

## 2020-02-11 ENCOUNTER — Other Ambulatory Visit: Payer: Self-pay | Admitting: Family Medicine

## 2020-02-11 DIAGNOSIS — Z889 Allergy status to unspecified drugs, medicaments and biological substances status: Secondary | ICD-10-CM

## 2020-02-11 DIAGNOSIS — J3089 Other allergic rhinitis: Secondary | ICD-10-CM

## 2020-02-11 DIAGNOSIS — K219 Gastro-esophageal reflux disease without esophagitis: Secondary | ICD-10-CM

## 2020-02-11 DIAGNOSIS — E782 Mixed hyperlipidemia: Secondary | ICD-10-CM

## 2020-02-12 ENCOUNTER — Other Ambulatory Visit: Payer: Self-pay | Admitting: Gerontology

## 2020-02-12 DIAGNOSIS — I10 Essential (primary) hypertension: Secondary | ICD-10-CM

## 2020-02-12 DIAGNOSIS — E782 Mixed hyperlipidemia: Secondary | ICD-10-CM

## 2020-02-12 DIAGNOSIS — Z889 Allergy status to unspecified drugs, medicaments and biological substances status: Secondary | ICD-10-CM

## 2020-02-12 DIAGNOSIS — J3089 Other allergic rhinitis: Secondary | ICD-10-CM

## 2020-02-12 DIAGNOSIS — K219 Gastro-esophageal reflux disease without esophagitis: Secondary | ICD-10-CM

## 2020-02-20 ENCOUNTER — Other Ambulatory Visit: Payer: Self-pay

## 2020-02-20 NOTE — Patient Outreach (Signed)
[facility]) Care Management  02/20/2020  Gabrielle Schultz [birth year] [id number]   Telephone outreach to patient to obtain mRS was successfully completed. MRS= 3  Thank you, [place] Care Management Assistant

## 2020-02-21 ENCOUNTER — Other Ambulatory Visit: Payer: Self-pay

## 2020-02-21 ENCOUNTER — Ambulatory Visit: Payer: Medicaid Other | Admitting: Gerontology

## 2020-02-21 ENCOUNTER — Other Ambulatory Visit: Payer: Self-pay | Admitting: Gerontology

## 2020-02-21 VITALS — BP 126/80 | HR 77 | Wt 160.4 lb

## 2020-02-21 DIAGNOSIS — K529 Noninfective gastroenteritis and colitis, unspecified: Secondary | ICD-10-CM

## 2020-02-21 DIAGNOSIS — E782 Mixed hyperlipidemia: Secondary | ICD-10-CM

## 2020-02-21 DIAGNOSIS — I10 Essential (primary) hypertension: Secondary | ICD-10-CM

## 2020-02-21 DIAGNOSIS — K219 Gastro-esophageal reflux disease without esophagitis: Secondary | ICD-10-CM

## 2020-02-21 DIAGNOSIS — R110 Nausea: Secondary | ICD-10-CM

## 2020-02-21 DIAGNOSIS — N1832 Chronic kidney disease, stage 3b: Secondary | ICD-10-CM

## 2020-02-21 DIAGNOSIS — I63411 Cerebral infarction due to embolism of right middle cerebral artery: Secondary | ICD-10-CM

## 2020-02-21 MED ORDER — CLONIDINE HCL 0.3 MG PO TABS
0.3000 mg | ORAL_TABLET | Freq: Three times a day (TID) | ORAL | 0 refills | Status: DC
Start: 2020-02-21 — End: 2020-03-19

## 2020-02-21 MED ORDER — PROMETHAZINE HCL 25 MG PO TABS: ORAL_TABLET | ORAL | 0 refills | Status: DC

## 2020-02-21 MED ORDER — CLOPIDOGREL BISULFATE 75 MG PO TABS: 75.0000 mg | ORAL_TABLET | Freq: Every day | ORAL | 2 refills | Status: DC

## 2020-02-21 MED ORDER — OMEPRAZOLE 20 MG PO CPDR: 20.0000 mg | DELAYED_RELEASE_CAPSULE | Freq: Every day | ORAL | 0 refills | Status: DC

## 2020-02-21 MED ORDER — AMLODIPINE BESYLATE 10 MG PO TABS: 10.0000 mg | ORAL_TABLET | Freq: Every day | ORAL | 2 refills | Status: DC

## 2020-02-21 NOTE — Progress Notes (Signed)
[facility] [place]   Progress Note: General Provider: Wolfgang Phoenix, NP  SUBJECTIVE:   Gabrielle Schultz is a 50 y.o. female who  has a past medical history of Acid reflux, Allergic rhinitis (06/26/2015), Allergy, Asthma, Carpal tunnel syndrome, Chronic kidney disease, Colitis, CVA (cerebral infarction), Gout, History of syncope, Hypertension (2008), Low HDL (under 40) (06/26/2015), MI (myocardial infarction) ([place]), Migraines, Poor circulation, Renal insufficiency, Stroke ([place]), and Syncope and collapse.. Patient presents today for medications refill. She states that she has a blood pressure machine at home but doesn't check her blood pressure because she doesn't have anyone to help her put on the blood pressure cuff. She reports that she saw nephrology ( Dr. Holley Raring Munsoor)  on January the 31st  for her chronic kidney disease. Per nephrology note, her Chronic kidney disease stage IIIb/proteinuria is stable, last GFR 31 on 01/10/2020. Nephrology added losartan 25 mg for proteinuria suppressions as well as hypertension benefit, Calcitrol increased to 0.74mg daily for secondary hyperparathyroidism. She reports that she was diagnosed with colitis in 2014, and since then, she has experienced moderate flare-ups once a week. She states that she gets some relief taking Imodium  AD for diarrhea and PRN Phenergan for nausea during flare-ups. Currently, she denies diarrhea, abdominal cramping, rectal bleeding, or pain. She states that the last time she saw GI was in 2016. She endorses avoiding foods that aggravate her symptoms. She adheres to the medication regimen. Overall she states that she is doing well and reports no further compliance.   Review of Systems  Constitutional: Negative.   HENT: Negative.   Eyes: Negative.   Respiratory: Negative.   Cardiovascular: Negative.   Gastrointestinal: Negative.   Genitourinary: Negative.   Musculoskeletal: Negative.   Skin: Negative.    Endo/Heme/Allergies: Negative.   Psychiatric/Behavioral: Negative.      OBJECTIVE: There were no vitals taken for this visit.  Wt Readings from Last 3 Encounters:  01/17/20 159 lb 4.8 oz (72.3 kg)  12/31/19 159 lb 12.8 oz (72.5 kg)  12/13/19 158 lb 11.2 oz (72 kg)     Physical Exam Constitutional:      Appearance: Normal appearance.  Cardiovascular:     Rate and Rhythm: Normal rate.     Heart sounds: Normal heart sounds.  Pulmonary:     Effort: Pulmonary effort is normal.     Breath sounds: Normal breath sounds.  Abdominal:     General: Bowel sounds are normal.     Palpations: Abdomen is soft.  Musculoskeletal:        General: Normal range of motion.     Cervical back: Normal range of motion.  Neurological:     General: No focal deficit present.     Mental Status: She is alert.  Psychiatric:        Mood and Affect: Mood normal.     ASSESSMENT/PLAN:   1. Primary hypertension - Continue current medications in addition to losartan 25 mg daily that was added by your nephrology Dr.Munsoor Lateef - cloNIDine (CATAPRES) 0.3 MG tablet; Take 1 tablet (0.3 mg total) by mouth 3 (three) times daily.  Dispense: 90 tablet; Refill: 0 - amLODipine (NORVASC) 10 MG tablet; Take 1 tablet (10 mg total) by mouth daily.  Dispense: 30 tablet; Refill: 2 -Continiue DASH diet and daily exercise as tolerated.  2. Gastroesophageal reflux disease without esophagitis  - omeprazole (PRILOSEC) 20 MG capsule; Take 1 capsule (20 mg total) by mouth daily.  Dispense: 90 capsule; Refill: 0  3. Nausea  - promethazine (PHENERGAN) 25 MG tablet; TAKE ONE TABLET BY MOUTH EVERY 6 HOURS AS NEEDED FOR NAUSEA OR VOMITING  Dispense: 15 tablet; Refill: 0  4. Stage 3b chronic kidney disease [facility]) - Follow-up with nephrology Dr.Munsoor Lateef in May 2022.  - Comp Met (CMET); Future  5. Colitis  - Ambulatory referral to Gastroenterology -[place] financial assistance application provided  6.  Cerebrovascular accident (CVA) due to embolism of right middle cerebral artery (HCC)  - clopidogrel (PLAVIX) 75 MG tablet; Take 1 tablet (75 mg total) by mouth daily.  Dispense: 30 tablet; Refill: 2    The patient was given clear instructions to go to ER or return to medical center if symptoms do not improve, worsen or new problems develop. The patient verbalized understanding and agreed with plan of care.  Cherisa Brucker, [place]

## 2020-02-21 NOTE — Patient Instructions (Signed)
Chronic Kidney Disease, Adult Chronic kidney disease is when lasting damage happens to the kidneys slowly over a long time. The kidneys help to:  Make pee (urine).  Make hormones.  Keep the right amount of fluids and chemicals in the body. Most often, this disease does not go away. You must take steps to help keep the kidney damage from getting worse. If steps are not taken, the kidneys might stop working forever. What are the causes?  Diabetes.  High blood pressure.  Diseases that affect the heart and blood vessels.  Other kidney diseases.  Diseases of the body's disease-fighting system.  A problem with the flow of pee.  Infections of the organs that make pee, store it, and take it out of the body.  Swelling or irritation of your blood vessels. What increases the risk?  Getting older.  Having someone in your family who has kidney disease or kidney failure.  Having a disease caused by genes.  Taking medicines often that harm the kidneys.  Being near or having contact with harmful substances.  Being very overweight.  Using tobacco now or in the past. What are the signs or symptoms?  Feeling very tired.  Having a swollen face, legs, ankles, or feet.  Feeling like you may vomit or vomiting.  Not feeling hungry.  Being confused or not able to focus.  Twitches and cramps in the leg muscles or other muscles.  Dry, itchy skin.  A taste of metal in your mouth.  Making less pee, or making more pee.  Shortness of breath.  Trouble sleeping. You may also become anemic or get weak bones. Anemic means there is not enough red blood cells or hemoglobin in your blood. You may get symptoms slowly. You may not notice them until the kidney damage gets very bad. How is this treated? Often, there is no cure for this disease. Treatment can help with symptoms and help keep the disease from getting worse. You may need to:  Avoid alcohol.  Avoid foods that are high in  salt, potassium, phosphorous, and protein.  Take medicines for symptoms and to help control other conditions.  Have dialysis. This treatment gets harmful waste out of your body.  Treat other problems that cause your kidney disease or make it worse. Follow these instructions at home: Medicines  Take over-the-counter and prescription medicines only as told by your doctor.  Do not take any new medicines, vitamins, or supplements unless your doctor says it is okay. Lifestyle  Do not smoke or use any products that contain nicotine or tobacco. If you need help quitting, ask your doctor.  If you drink alcohol: ? Limit how much you use to:  0-1 drink a day for women who are not pregnant.  0-2 drinks a day for men. ? Know how much alcohol is in your drink. In the [place] one drink equals one 12 oz bottle of beer (355 mL), one 5 oz glass of wine (148 mL), or one 1 oz glass of hard liquor (44 mL).  Stay at a healthy weight. If you need help losing weight, ask your doctor.   General instructions  Follow instructions from your doctor about what you cannot eat or drink.  Track your blood pressure at home. Tell your doctor about any changes.  If you have diabetes, track your blood sugar.  Exercise at least 30 minutes a day, 5 days a week.  Keep your shots (vaccinations) up to date.  Keep all follow-up visits.     Where to find more information  American Association of Kidney Patients: BombTimer.gl  National Kidney Foundation: www.kidney.[place]: https://mathis.com/  Life Options: www.lifeoptions.org  Kidney School: www.kidneyschool.org Contact a doctor if:  Your symptoms get worse.  You get new symptoms. Get help right away if:  You get symptoms of end-stage kidney disease. These include: ? Headaches. ? Losing feeling in your hands or feet. ? Easy bruising. ? Having hiccups often. ? Chest pain. ? Shortness of breath. ? Lack of menstrual periods, in  women.  You have a fever.  You make less pee than normal.  You have pain or you bleed when you pee or poop. These symptoms may be an emergency. Get help right away. Call your local emergency services (911 in the [place]  Do not wait to see if the symptoms will go away.  Do not drive yourself to the hospital. Summary  Chronic kidney disease is when lasting damage happens to the kidneys slowly over a long time.  Causes of this disease include diabetes and high blood pressure.  Often, there is no cure for this disease. Treatment can help symptoms and help keep the disease from getting worse.  Treatment may involve lifestyle changes, medicines, and dialysis. This information is not intended to replace advice given to you by your health care provider. Make sure you discuss any questions you have with your health care provider. Document Revised: 03/28/2019 Document Reviewed: 03/28/2019 Elsevier Patient Education  [place]. [email or web address]">  DASH Eating Plan DASH stands for Dietary Approaches to Stop Hypertension. The DASH eating plan is a healthy eating plan that has been shown to:  Reduce high blood pressure (hypertension).  Reduce your risk for type 2 diabetes, heart disease, and stroke.  Help with weight loss. What are tips for following this plan? Reading food labels  Check food labels for the amount of salt (sodium) per serving. Choose foods with less than 5 percent of the Daily Value of sodium. Generally, foods with less than 300 milligrams (mg) of sodium per serving fit into this eating plan.  To find whole grains, look for the word "whole" as the first word in the ingredient list. Shopping  Buy products labeled as "low-sodium" or "no salt added."  Buy fresh foods. Avoid canned foods and pre-made or frozen meals. Cooking  Avoid adding salt when cooking. Use salt-free seasonings or herbs instead of table salt or  sea salt. Check with your health care provider or pharmacist before using salt substitutes.  Do not fry foods. Cook foods using healthy methods such as baking, boiling, grilling, roasting, and broiling instead.  Cook with heart-healthy oils, such as olive, canola, avocado, soybean, or sunflower oil. Meal planning  Eat a balanced diet that includes: ? 4 or more servings of fruits and 4 or more servings of vegetables each day. Try to fill one-half of your plate with fruits and vegetables. ? 6-8 servings of whole grains each day. ? Less than 6 oz (170 g) of lean meat, poultry, or fish each day. A 3-oz (85-g) serving of meat is about the same size as a deck of cards. One egg equals 1 oz (28 g). ? 2-3 servings of low-fat dairy each day. One serving is 1 cup (237 mL). ? 1 serving of nuts, seeds, or beans 5 times each week. ? 2-3 servings of heart-healthy fats. Healthy fats called omega-3 fatty acids are found in foods such as walnuts, flaxseeds, fortified milks, and eggs. These fats are also  found in cold-water fish, such as sardines, salmon, and mackerel.  Limit how much you eat of: ? Canned or prepackaged foods. ? Food that is high in trans fat, such as some fried foods. ? Food that is high in saturated fat, such as fatty meat. ? Desserts and other sweets, sugary drinks, and other foods with added sugar. ? Full-fat dairy products.  Do not salt foods before eating.  Do not eat more than 4 egg yolks a week.  Try to eat at least 2 vegetarian meals a week.  Eat more home-cooked food and less restaurant, buffet, and fast food.   Lifestyle  When eating at a restaurant, ask that your food be prepared with less salt or no salt, if possible.  If you drink alcohol: ? Limit how much you use to:  0-1 drink a day for women who are not pregnant.  0-2 drinks a day for men. ? Be aware of how much alcohol is in your drink. In the [place] one drink equals one 12 oz bottle of beer (355 mL), one 5 oz  glass of wine (148 mL), or one 1 oz glass of hard liquor (44 mL). General information  Avoid eating more than 2,300 mg of salt a day. If you have hypertension, you may need to reduce your sodium intake to 1,500 mg a day.  Work with your health care provider to maintain a healthy body weight or to lose weight. Ask what an ideal weight is for you.  Get at least 30 minutes of exercise that causes your heart to beat faster (aerobic exercise) most days of the week. Activities may include walking, swimming, or biking.  Work with your health care provider or dietitian to adjust your eating plan to your individual calorie needs. What foods should I eat? Fruits All fresh, dried, or frozen fruit. Canned fruit in natural juice (without added sugar). Vegetables Fresh or frozen vegetables (raw, steamed, roasted, or grilled). Low-sodium or reduced-sodium tomato and vegetable juice. Low-sodium or reduced-sodium tomato sauce and tomato paste. Low-sodium or reduced-sodium canned vegetables. Grains Whole-grain or whole-wheat bread. Whole-grain or whole-wheat pasta. Brown rice. [place]. Bulgur. Whole-grain and low-sodium cereals. Pita bread. Low-fat, low-sodium crackers. Whole-wheat flour tortillas. Meats and other proteins Skinless chicken or [place]. Ground chicken or [place]. Pork with fat trimmed off. Fish and seafood. Egg whites. Dried beans, peas, or lentils. Unsalted nuts, nut butters, and seeds. Unsalted canned beans. Lean cuts of beef with fat trimmed off. Low-sodium, lean precooked or cured meat, such as sausages or meat loaves. Dairy Low-fat (1%) or fat-free (skim) milk. Reduced-fat, low-fat, or fat-free cheeses. Nonfat, low-sodium ricotta or cottage cheese. Low-fat or nonfat yogurt. Low-fat, low-sodium cheese. Fats and oils Soft margarine without trans fats. Vegetable oil. Reduced-fat, low-fat, or light mayonnaise and salad dressings (reduced-sodium). Canola, safflower, olive, avocado, soybean,  and sunflower oils. Avocado. Seasonings and condiments Herbs. Spices. Seasoning mixes without salt. Other foods Unsalted popcorn and pretzels. Fat-free sweets. The items listed above may not be a complete list of foods and beverages you can eat. Contact a dietitian for more information. What foods should I avoid? Fruits Canned fruit in a light or heavy syrup. Fried fruit. Fruit in cream or butter sauce. Vegetables Creamed or fried vegetables. Vegetables in a cheese sauce. Regular canned vegetables (not low-sodium or reduced-sodium). Regular canned tomato sauce and paste (not low-sodium or reduced-sodium). Regular tomato and vegetable juice (not low-sodium or reduced-sodium). Angie Fava. Olives. Grains Baked goods made with fat, such as croissants,  muffins, or some breads. Dry pasta or rice meal packs. Meats and other proteins Fatty cuts of meat. Ribs. Fried meat. Berniece Salines. Bologna, salami, and other precooked or cured meats, such as sausages or meat loaves. Fat from the back of a pig (fatback). Bratwurst. Salted nuts and seeds. Canned beans with added salt. Canned or smoked fish. Whole eggs or egg yolks. Chicken or [place] with skin. Dairy Whole or 2% milk, cream, and half-and-half. Whole or full-fat cream cheese. Whole-fat or sweetened yogurt. Full-fat cheese. Nondairy creamers. Whipped toppings. Processed cheese and cheese spreads. Fats and oils Butter. Stick margarine. Lard. Shortening. Ghee. Bacon fat. Tropical oils, such as coconut, palm kernel, or palm oil. Seasonings and condiments Onion salt, garlic salt, seasoned salt, table salt, and sea salt. Worcestershire sauce. Tartar sauce. Barbecue sauce. Teriyaki sauce. Soy sauce, including reduced-sodium. Steak sauce. Canned and packaged gravies. Fish sauce. Oyster sauce. Cocktail sauce. Store-bought horseradish. Ketchup. Mustard. Meat flavorings and tenderizers. Bouillon cubes. Hot sauces. Pre-made or packaged marinades. Pre-made or packaged taco  seasonings. Relishes. Regular salad dressings. Other foods Salted popcorn and pretzels. The items listed above may not be a complete list of foods and beverages you should avoid. Contact a dietitian for more information. Where to find more information  National Heart, Lung, and Blood Institute: https://wilson-eaton.com/  American Heart Association: www.heart.org  Academy of Nutrition and Dietetics: www.eatright.[place]: www.kidney.org Summary  The DASH eating plan is a healthy eating plan that has been shown to reduce high blood pressure (hypertension). It may also reduce your risk for type 2 diabetes, heart disease, and stroke.  When on the DASH eating plan, aim to eat more fresh fruits and vegetables, whole grains, lean proteins, low-fat dairy, and heart-healthy fats.  With the DASH eating plan, you should limit salt (sodium) intake to 2,300 mg a day. If you have hypertension, you may need to reduce your sodium intake to 1,500 mg a day.  Work with your health care provider or dietitian to adjust your eating plan to your individual calorie needs. This information is not intended to replace advice given to you by your health care provider. Make sure you discuss any questions you have with your health care provider. Document Revised: 11/24/2018 Document Reviewed: 11/24/2018 Elsevier Patient Education  2021 [employer].

## 2020-03-07 ENCOUNTER — Other Ambulatory Visit: Payer: Self-pay | Admitting: Gerontology

## 2020-03-07 DIAGNOSIS — E782 Mixed hyperlipidemia: Secondary | ICD-10-CM

## 2020-03-11 ENCOUNTER — Other Ambulatory Visit: Payer: Self-pay | Admitting: Gerontology

## 2020-03-11 DIAGNOSIS — J3089 Other allergic rhinitis: Secondary | ICD-10-CM

## 2020-03-19 ENCOUNTER — Other Ambulatory Visit: Payer: Self-pay | Admitting: Gerontology

## 2020-03-19 DIAGNOSIS — I10 Essential (primary) hypertension: Secondary | ICD-10-CM

## 2020-04-01 ENCOUNTER — Telehealth: Payer: Self-pay | Admitting: Pharmacist

## 2020-04-01 NOTE — Telephone Encounter (Signed)
04/01/2020 11:21:10 AM - Call to [facility] for Advair refill  -- Gabrielle Schultz - Tuesday, April 01, 2020 11:19 AM --Called refill to [facility]--rx#[id number] order# [id number] refills left.  patient enrollment with [facility] ends 07/02/2020 can be renewed end of April.

## 2020-04-02 ENCOUNTER — Other Ambulatory Visit: Payer: Self-pay

## 2020-04-07 ENCOUNTER — Other Ambulatory Visit: Payer: Self-pay

## 2020-04-11 ENCOUNTER — Other Ambulatory Visit: Payer: Self-pay

## 2020-04-16 ENCOUNTER — Other Ambulatory Visit: Payer: Self-pay

## 2020-04-16 MED ORDER — FLUTICASONE-SALMETEROL 100-50 MCG/DOSE IN AEPB
1.0000 | INHALATION_SPRAY | Freq: Two times a day (BID) | RESPIRATORY_TRACT | 3 refills | Status: DC
Start: 2020-01-15 — End: 2020-04-21
  Filled 2020-04-16: qty 180, 90d supply, fill #0

## 2020-04-21 ENCOUNTER — Other Ambulatory Visit: Payer: Self-pay

## 2020-04-21 ENCOUNTER — Ambulatory Visit (INDEPENDENT_AMBULATORY_CARE_PROVIDER_SITE_OTHER): Payer: Self-pay | Admitting: Gastroenterology

## 2020-04-21 ENCOUNTER — Encounter: Payer: Self-pay | Admitting: Gastroenterology

## 2020-04-21 VITALS — BP 146/83 | HR 101 | Temp 98.0°F

## 2020-04-21 DIAGNOSIS — Z86010 Personal history of colonic polyps: Secondary | ICD-10-CM

## 2020-04-21 DIAGNOSIS — K529 Noninfective gastroenteritis and colitis, unspecified: Secondary | ICD-10-CM

## 2020-04-21 MED ORDER — PANCRELIPASE (LIP-PROT-AMYL) 36000-114000 UNITS PO CPEP
ORAL_CAPSULE | ORAL | 0 refills | Status: DC
  Filled 2020-04-21: qty 240, fill #0

## 2020-04-21 MED ORDER — GOLYTELY 236 G PO SOLR
4000.0000 mL | Freq: Once | ORAL | 0 refills | Status: AC
  Filled 2020-04-21: qty 4000, 1d supply, fill #0

## 2020-04-21 NOTE — Progress Notes (Signed)
Gabrielle Darby, MD [address]  [place]  [place], [place] [address]  Main: [phone]  Fax: [phone]    Gastroenterology Consultation  Referring Provider:     Langston Reusing, NP Primary Care Physician:  Langston Reusing, NP Primary Gastroenterologist:  Dr. Cephas Schultz Reason for Consultation:     Chronic diarrhea, abdominal bloating        HPI:   Gabrielle Schultz is a 50 y.o. female referred by Dr. Langston Reusing, NP  for consultation & management of chronic diarrhea and abdominal bloating.  Patient has history of CVA, chronic kidney disease, currently on aspirin and Plavix secondary to stroke, history of left-sided ischemic colitis based on colonoscopy in 2016.  Patient is here for further management of chronic diarrhea and abdominal bloating.  She reports that she has been experiencing nonbloody, watery bowel movements since 2014.  She underwent colonoscopy in 2016, found to have adenomatous polyps as well as random colon biopsies which revealed ischemic colitis of the sigmoid colon with no evidence of chronicity.  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   Patient reports that she has been experiencing diarrheal episodes at least twice a week, explosive in nature associated with significant gas and abdominal bloating, lower abdominal cramps.  She would have 2-3 episodes per day.  She reports that she never had formed bowel movements.  The days that she does not have diarrhea, she does not have any BM.  She denies any weight loss, in fact gained few pounds.  She does acknowledge drinking juices, sweet tea, sodas daily along with water.  She does report that she has limited access to healthy food and also unrestricted diet due to history of chronic kidney  disease.  Patient underwent hypercoagulable work-up by hematologist 2016 due to history of recurrent CVA, which came back as unremarkable.  She is closely followed by Dr. Tomi Likens, neurologist, has appointment with him in June. She continues to smoke half pack per day  NSAIDs: None  Antiplts/Anticoagulants/Anti thrombotics: Aspirin and Plavix for history of stroke She denies family history of GI malignancy, IBD  GI Procedures:  Colonoscopy 06/26/2014 - One 15 mm polyp in the proximal ascending colon. Resected and retrieved. Clip was placed. - One 8 mm polyp in the proximal ascending colon. Resected and retrieved. Clips were placed. - Segmental moderate inflammation was found in the sigmoid colon and in the descending colon from 45 cm to 15 cm. Rectum spared. Biopsied. - The examination was otherwise normal. - Multiple biopsies were obtained in the proximal transverse colon, at the hepatic flexure and in the ascending colon. - Four biopsies were obtained in the rectum from normal mucosa. - Suspect this is ischemic colitis and not Crohn's or Ulcerative colitis.  DIAGNOSIS:  A. COLON POLYP, ASCENDING; HOT SNARE:  - SESSILE SERRATED ADENOMA, MULTIPLE FRAGMENTS.  - NEGATIVE FOR HIGH-GRADE DYSPLASIA AND MALIGNANCY.   B. RIGHT COLON, RANDOM; COLD BIOPSY:  - TWO FRAGMENTS OF POLYPOID COLONIC MUCOSA WITH FEATURES OF A SESSILE  SERRATED ADENOMA.  - FRAGMENTS OF COLONIC MUCOSA WITH MELANOSIS COLI.  - NEGATIVE FOR HIGH-GRADE DYSPLASIA AND MALIGNANCY.   C. COLON, SIGMOID; COLD BIOPSY:  - COLONIC MUCOSA WITH FEATURES CONSISTENT WITH ISCHEMIC COLITIS, SEE  COMMENT.  - NEGATIVE FOR DYSPLASIA AND MALIGNANCY.   D. RECTUM;  COLDBIOPSY:  - COLONIC MUCOSA NEGATIVE FOR MICROSCOPIC COLITIS, DYSPLASIA AND  MALIGNANCY.    Past Medical History:  Diagnosis Date  . Acid reflux   . Allergic rhinitis 06/26/2015  . Allergy   . Asthma   . Carpal tunnel syndrome    right hand  . Chronic kidney disease    . Colitis   . CVA (cerebral infarction)    2009, 2015  . Gout   . History of syncope   . Hypertension 2008  . Low HDL (under 40) 06/26/2015  . MI (myocardial infarction) ([place])    2015 - patient wasnt told by cardiologist that she had MI but has had cardiac cath in past  . Migraines   . Poor circulation   . Renal insufficiency   . Stroke ([place])   . Syncope and collapse     Past Surgical History:  Procedure Laterality Date  . BUBBLE STUDY  12/03/2019   Procedure: BUBBLE STUDY;  Surgeon: Geralynn Rile, MD;  Location: [facility];  Service: Cardiovascular;;  . CARDIAC CATHETERIZATION    . CESAREAN SECTION  1994   Dr. Ammie Dalton  . COLONOSCOPY WITH PROPOFOL N/A 06/26/2014   Procedure: COLONOSCOPY WITH PROPOFOL;  Surgeon: Josefine Class, MD;  [facility] ENDOSCOPY;  Service: Endoscopy;  Laterality: N/A;  . DILATION AND CURETTAGE OF UTERUS  x2 for incomplete SABs  . TEE WITHOUT CARDIOVERSION N/A 12/03/2019   Procedure: TRANSESOPHAGEAL ECHOCARDIOGRAM (TEE);  Surgeon: Geralynn Rile, MD;  Location: [facility];  Service: Cardiovascular;  Laterality: N/A;    Current Outpatient Medications:  .  ADVAIR DISKUS 100-50 MCG/DOSE AEPB, INHALE 1 PUFF INTO THE LUNGS 2 TIMES A DAY, Disp: 180 each, Rfl: 3 .  albuterol (VENTOLIN HFA) 108 (90 Base) MCG/ACT inhaler, INHALE 2 PUFFS EVERY 4 HOURS AS NEEDED FOR COUGH AND WHEEZING. (Patient taking differently: Inhale 2 puffs into the lungs every 4 (four) hours as needed for wheezing (cough).), Disp: 54 g, Rfl: 1 .  allopurinol (ZYLOPRIM) 100 MG tablet, TAKE ONE TABLET BY MOUTH EVERY DAY, Disp: 30 tablet, Rfl: 11 .  amLODipine (NORVASC) 10 MG tablet, TAKE ONE TABLET BY MOUTH EVERY DAY, Disp: 30 tablet, Rfl: 2 .  aspirin 81 MG chewable tablet, CHEW ONE TABLET BY MOUTH EVERY DAY FOR 21 DAYS, Disp: 21 tablet, Rfl: 0 .  calcitRIOL (ROCALTROL) 0.25 MCG capsule, Take 0.25 mcg by mouth daily. , Disp: , Rfl:  .  calcitRIOL (ROCALTROL) 0.5 MCG  capsule, TAKE ONE CAPSULE BY MOUTH EVERY DAY, Disp: 90 capsule, Rfl: 3 .  cetirizine (ZYRTEC) 10 MG tablet, TAKE ONE TABLET BY MOUTH EVERY DAY, Disp: 90 tablet, Rfl: 1 .  cloNIDine (CATAPRES) 0.3 MG tablet, TAKE ONE TABLET BY MOUTH 3 TIMES A DAY, Disp: 90 tablet, Rfl: 1 .  clopidogrel (PLAVIX) 75 MG tablet, TAKE ONE TABLET BY MOUTH EVERY DAY, Disp: 30 tablet, Rfl: 2 .  fluticasone (FLONASE) 50 MCG/ACT nasal spray, PLACE 1 SPRAY INTO BOTH NOSTRILS 2 TIMES A DAY, Disp: 16 g, Rfl: 0 .  Fluticasone-Salmeterol (ADVAIR) 100-50 MCG/DOSE AEPB, Inhale 1 puff into the lungs 2 (two) times daily., Disp: 1 each, Rfl: 3 .  furosemide (LASIX) 40 MG tablet, TAKE ONE TABLET BY MOUTH EVERY DAY, Disp: 90 tablet, Rfl: 0 .  lipase/protease/amylase (CREON) 36000 UNITS CPEP capsule, Take 2 tablets with the first bite of each meal and 1 tablet with the first bite of each snack, Disp: 240 capsule, Rfl: 0 .  omeprazole (PRILOSEC) 20 MG capsule,  TAKE ONE CAPSULE BY MOUTH EVERY DAY, Disp: 90 capsule, Rfl: 0 .  polyethylene glycol (GOLYTELY) 236 g solution, Take 4,000 mLs by mouth once for 1 dose., Disp: 4000 mL, Rfl: 0 .  potassium chloride SA (KLOR-CON) 20 MEQ tablet, Take 1 tablet (20 mEq total) by mouth daily. (Patient taking differently: Take 20 mEq by mouth as needed (potassium).), Disp: 60 tablet, Rfl: 0 .  promethazine (PHENERGAN) 25 MG tablet, TAKE ONE TABLET BY MOUTH EVERY 6 HOURS AS NEEDED FOR NAUSEA OR VOMITING, Disp: 15 tablet, Rfl: 0 .  ranitidine (ZANTAC) 150 MG tablet, Take 1 tablet (150 mg total) by mouth 2 (two) times daily., Disp: 60 tablet, Rfl: 3 .  simvastatin (ZOCOR) 10 MG tablet, TAKE ONE TABLET BY MOUTH EVERY DAY, Disp: 90 tablet, Rfl: 1   Family History  Problem Relation Age of Onset  . Hyperlipidemia Mother   . Heart disease Mother        has a defibrillator  . COPD Mother   . Hypertension Father   . Heart disease Father   . Hyperlipidemia Father   . Hypertension Sister        died in her 8s  from heart failure and end stage kidney disease  . Kidney disease Sister   . Hypertension Brother   . Heart disease Brother        has a defibrillator  . Coronary artery disease Brother        had CABG  . Healthy Child   . Diabetes Child      Social History   Tobacco Use  . Smoking status: Current Every Day Smoker    Packs/day: 0.50    Years: 33.00    Pack years: 16.50    Types: Cigarettes    Start date: 03/04/2017  . Smokeless tobacco: Never Used  . Tobacco comment: 3 cigs daily--12/06/2019  Vaping Use  . Vaping Use: Never used  Substance Use Topics  . Alcohol use: Not Currently    Comment: "seldom" - drinks 350-775m when she does drink  . Drug use: No    Allergies as of 04/21/2020 - Review Complete 04/21/2020  Allergen Reaction Noted  . Coconut oil Hives and Swelling 06/25/2014  . Amoxicillin Other (See Comments) 02/28/2015  . Penicillins Other (See Comments) 12/21/2014  . Singulair [montelukast sodium] Rash 10/06/2017    Review of Systems:    All systems reviewed and negative except where noted in HPI.   Physical Exam:  BP (!) 146/83 (BP Location: Left Arm, Patient Position: Sitting, Cuff Size: Normal)   Pulse (!) 101   Temp 98 F (36.7 C) (Oral)  No LMP recorded. Patient is premenopausal.  General:   Alert,  Well-developed, well-nourished, pleasant and cooperative in NAD Head:  Normocephalic and atraumatic. Eyes:  Sclera clear, no icterus.   Conjunctiva pink. Ears:  Normal auditory acuity. Nose:  No deformity, discharge, or lesions. Mouth:  No deformity or lesions,oropharynx pink & moist. Neck:  Supple; no masses or thyromegaly. Lungs:  Respirations even and unlabored.  Clear throughout to auscultation.   No wheezes, crackles, or rhonchi. No acute distress. Heart:  Regular rate and rhythm; no murmurs, clicks, rubs, or gallops. Abdomen:  Normal bowel sounds. Soft, non-tender and diffusely moderately distended, tympanic to percussion without masses,  hepatosplenomegaly or hernias noted.  No guarding or rebound tenderness.   Rectal: Not performed Msk:  Symmetrical without gross deformities. Good, equal movement & strength mild upper extremity weakness Pulses:  Normal pulses noted. Extremities:  No clubbing or edema.  No cyanosis. Neurologic:  Alert and oriented x3;  grossly normal neurologically. Skin:  Intact without significant lesions or rashes. No jaundice. Psych:  Alert and cooperative. Normal mood and affect.  Imaging Studies: Reviewed  Assessment and Plan:   DARCY COPUS is a 50 y.o. female with obesity, chronic kidney disease, history of ischemic CVA with residual bilateral upper extremity weakness, maintained on DAPT, chronic tobacco use, history of ischemic colitis is seen in consultation for chronic diarrhea, lower abdominal cramps and abdominal bloating without any weight loss.  Patient does have history of lactose intolerance and avoids lactose products No evidence of anemia, TSH is normal, hemoglobin A1c is normal CT angio abdomen and pelvis in 2018 did not reveal any vascular obstruction result in mesenteric ischemia other than narrowing of the celiac artery from median arcuate ligament syndrome With history of tobacco use, differentials include pancreatic insufficiency With history of high consumption of carbonated beverages and fruit juices, possibility of osmotic diarrhea Recommend stool studies to rule out infection, check pancreatic fecal elastase levels Check celiac disease panel, H. pylori stool antigen Recommend upper endoscopy with biopsies Trial of pancreatic enzymes Strongly advised patient to quit smoking, cut back on sugar drinks   Personal history of tubular adenomas of colon Recommend surveillance colonoscopy, TI evaluation as well as biopsies given history of chronic diarrhea    Follow up in 3 months   Gabrielle Darby, MD

## 2020-04-22 ENCOUNTER — Other Ambulatory Visit: Payer: Self-pay

## 2020-04-23 ENCOUNTER — Telehealth: Payer: Self-pay

## 2020-04-23 ENCOUNTER — Other Ambulatory Visit: Payer: Self-pay

## 2020-04-23 LAB — CELIAC DISEASE PANEL
Endomysial IgA: NEGATIVE
IgA/Immunoglobulin A, Serum: 248 mg/dL (ref 87–352)
Transglutaminase IgA: <2 U/mL (ref 0–3)

## 2020-04-23 NOTE — Telephone Encounter (Signed)
[facility] called to let the nurse know that the doctor cleared the patient for her upcoming procedure in May. Unfortunately the office fax machine is broken and she's unable to fax [place] the form. Please call their office for questions.

## 2020-04-23 NOTE — Telephone Encounter (Signed)
They did not let us know when to stop the plavix. Called back over to the doctor office and the on call service answer and they are going to send a note back

## 2020-04-24 ENCOUNTER — Telehealth: Payer: Self-pay | Admitting: Neurology

## 2020-04-24 ENCOUNTER — Other Ambulatory Visit: Payer: Self-pay

## 2020-04-24 NOTE — Telephone Encounter (Signed)
Gabrielle Schultz Dr. Tomi Likens CMA called and states to stop the Plavix 5 days before the procedure and restart it 1 day after the procedure.   Patient verbalized understanding.

## 2020-04-24 NOTE — Telephone Encounter (Signed)
LMOVM with Plavix instructions: Per Dr.JAffe note on form pt is to stop plavix 5 days prior to her procedure and to restart 1 day after her procedure.

## 2020-04-25 ENCOUNTER — Other Ambulatory Visit: Payer: Self-pay

## 2020-04-28 ENCOUNTER — Telehealth: Payer: Self-pay | Admitting: Pharmacist

## 2020-04-28 NOTE — Telephone Encounter (Signed)
I have received a pharmacy sheet to order new med-Creon 36000units  (Take 2 tablets with the first bite of each meal & 1 tablet with the first bite of each snack). I have printed off [employer] application to order this med-but patient eligibility for [facility] ended 04/04/20. Patient was mailed a Re-Certification packet in Early March and patient has not returned-I am remailing to patient. We are unable to order the Creon for patient until she provides all Re-Cert information.

## 2020-05-08 ENCOUNTER — Encounter: Payer: Self-pay | Admitting: Registered Nurse

## 2020-05-08 ENCOUNTER — Encounter: Admission: RE | Payer: Self-pay | Source: Ambulatory Visit

## 2020-05-08 ENCOUNTER — Ambulatory Visit: Admission: RE | Admit: 2020-05-08 | Payer: Medicaid Other | Source: Ambulatory Visit | Admitting: Gastroenterology

## 2020-05-08 SURGERY — COLONOSCOPY WITH PROPOFOL
Anesthesia: General

## 2020-05-09 ENCOUNTER — Other Ambulatory Visit: Payer: Self-pay

## 2020-05-15 ENCOUNTER — Other Ambulatory Visit: Payer: Self-pay

## 2020-05-15 ENCOUNTER — Other Ambulatory Visit: Payer: Medicaid Other

## 2020-05-15 DIAGNOSIS — N1832 Chronic kidney disease, stage 3b: Secondary | ICD-10-CM

## 2020-05-16 LAB — COMPREHENSIVE METABOLIC PANEL
ALT: 8 IU/L (ref 0–32)
AST: 13 IU/L (ref 0–40)
Albumin/Globulin Ratio: 1.4 (ref 1.2–2.2)
Albumin: 4.3 g/dL (ref 3.8–4.8)
Alkaline Phosphatase: 142 IU/L — ABNORMAL HIGH (ref 44–121)
BUN/Creatinine Ratio: 15 (ref 9–23)
BUN: 28 mg/dL — ABNORMAL HIGH (ref 6–24)
Bilirubin Total: 0.2 mg/dL (ref 0.0–1.2)
CO2: 22 mmol/L (ref 20–29)
Calcium: 9.6 mg/dL (ref 8.7–10.2)
Chloride: 102 mmol/L (ref 96–106)
Creatinine, Ser: 1.87 mg/dL — ABNORMAL HIGH (ref 0.57–1.00)
Globulin, Total: 3 g/dL (ref 1.5–4.5)
Glucose: 97 mg/dL (ref 65–99)
Potassium: 4.3 mmol/L (ref 3.5–5.2)
Sodium: 139 mmol/L (ref 134–144)
Total Protein: 7.3 g/dL (ref 6.0–8.5)
eGFR: 33 mL/min/{1.73_m2} — ABNORMAL LOW (ref >59)

## 2020-05-22 ENCOUNTER — Other Ambulatory Visit: Payer: Self-pay

## 2020-05-22 ENCOUNTER — Ambulatory Visit: Payer: Medicaid Other | Admitting: Obstetrics and Gynecology

## 2020-05-22 VITALS — BP 123/71 | HR 56 | Temp 98.1°F | Resp 16 | Ht 61.0 in | Wt 163.8 lb

## 2020-05-22 DIAGNOSIS — J4541 Moderate persistent asthma with (acute) exacerbation: Secondary | ICD-10-CM

## 2020-05-22 DIAGNOSIS — N1832 Chronic kidney disease, stage 3b: Secondary | ICD-10-CM

## 2020-05-22 DIAGNOSIS — K219 Gastro-esophageal reflux disease without esophagitis: Secondary | ICD-10-CM

## 2020-05-22 DIAGNOSIS — I10 Essential (primary) hypertension: Secondary | ICD-10-CM

## 2020-05-22 DIAGNOSIS — E876 Hypokalemia: Secondary | ICD-10-CM

## 2020-05-22 DIAGNOSIS — R110 Nausea: Secondary | ICD-10-CM

## 2020-05-22 DIAGNOSIS — E782 Mixed hyperlipidemia: Secondary | ICD-10-CM

## 2020-05-22 MED ORDER — POTASSIUM CHLORIDE CRYS ER 20 MEQ PO TBCR
20.0000 meq | EXTENDED_RELEASE_TABLET | ORAL | 0 refills | Status: DC | PRN
  Filled 2020-05-22: qty 14, 14d supply, fill #0

## 2020-05-22 MED ORDER — PROMETHAZINE HCL 25 MG PO TABS
ORAL_TABLET | ORAL | 0 refills | Status: DC
  Filled 2020-05-22: qty 15, 4d supply, fill #0

## 2020-05-22 MED ORDER — AMLODIPINE BESYLATE 10 MG PO TABS
ORAL_TABLET | Freq: Every day | ORAL | 0 refills | Status: DC
  Filled 2020-05-22: qty 14, 14d supply, fill #0

## 2020-05-22 MED ORDER — CLONIDINE HCL 0.3 MG PO TABS
ORAL_TABLET | Freq: Three times a day (TID) | ORAL | 0 refills | Status: DC
  Filled 2020-05-22: qty 42, 14d supply, fill #0

## 2020-05-22 MED ORDER — OMEPRAZOLE 20 MG PO CPDR
DELAYED_RELEASE_CAPSULE | Freq: Every day | ORAL | 0 refills | Status: DC
  Filled 2020-05-22: qty 14, 14d supply, fill #0

## 2020-05-22 MED ORDER — FLUTICASONE-SALMETEROL 250-50 MCG/ACT IN AEPB
1.0000 | INHALATION_SPRAY | Freq: Two times a day (BID) | RESPIRATORY_TRACT | 2 refills | Status: DC
Start: 2020-05-22 — End: 2021-05-21
  Filled 2020-05-22: qty 60, 30d supply, fill #0

## 2020-05-22 MED ORDER — SIMVASTATIN 10 MG PO TABS
ORAL_TABLET | Freq: Every day | ORAL | 0 refills | Status: DC
  Filled 2020-05-22: qty 90, 90d supply, fill #0

## 2020-05-22 MED ORDER — FUROSEMIDE 40 MG PO TABS
ORAL_TABLET | Freq: Every day | ORAL | 0 refills | Status: DC
  Filled 2020-05-22: qty 14, 14d supply, fill #0

## 2020-05-22 MED ORDER — ALBUTEROL SULFATE HFA 108 (90 BASE) MCG/ACT IN AERS
2.0000 | INHALATION_SPRAY | RESPIRATORY_TRACT | 2 refills | Status: DC | PRN
  Filled 2020-05-22: qty 6.7, 17d supply, fill #0

## 2020-05-22 NOTE — Progress Notes (Signed)
[facility]   Progress Note: General Provider: Ardeth Perfect, PA-C  SUBJECTIVE:   Gabrielle Schultz is a 50 y.o. female who  has a past medical history of Acid reflux, Allergic rhinitis (06/26/2015), Allergy, Asthma, Carpal tunnel syndrome, Chronic kidney disease, Colitis, CVA (cerebral infarction), Gout, History of syncope, Hypertension (2008), Low HDL (under 40) (06/26/2015), MI (myocardial infarction) ([place]), Migraines, Poor circulation, Renal insufficiency, Stroke ([place]), and Syncope and collapse.. Patient presents today for medications refill.  She reports that she saw nephrology ( Dr. Holley Raring Munsoor)  on January the 31st  for her chronic kidney disease. Per nephrology note, her Chronic kidney disease stage IIIb/proteinuria is stable, last GFR 31 on 01/10/2020. GFR 5/22 is 33. Has upcoming nephrology appt but pt hasn't heard from them re: date.  Nephrology added losartan 25 mg for proteinuria suppressions as well as hypertension benefit, has Rx RF.  Calcitrol increased to 0.24mg daily for secondary hyperparathyroidism. Has Rx RF. She takes clonidine TID and amlodipine as well for HTN with good control. No side effects.  She reports that she was diagnosed with colitis in 2014, and since then, she has experienced moderate flare-ups once a week. She states that she gets some relief taking Imodium  AD for diarrhea and PRN Phenergan for nausea during flare-ups. Is waiting on tax return for financial assistance to see GI.  Pt has mod persistent asthma. Uses advair discus 100/50 BID and albuterol rescue inhaler about 20 times a wk. Also takes zyrtec and uses flonase BID. Has been on flonase for years and never had nose bleeds before.  Pt with GERD, taking prilosec with mostly relief. Used to add zantac prn but no longer available.  Needs RF of lasix.  Hx of CVA, on plavix since 11/21. Has been having nose bleeds from 1 nostril a couple times daily for past month, lasting at least an hr.  Pt stopped plavix 2 wks ago and hasn't had a nose bleed since. Refuses to take it. Is taking ASA daily.    Review of Systems  Constitutional: Negative.   HENT: Negative.   Eyes: Negative.   Respiratory: Negative.   Cardiovascular: Negative.   Gastrointestinal: Negative.   Genitourinary: Negative.   Musculoskeletal: Negative.   Skin: Negative.   Endo/Heme/Allergies: Negative.   Psychiatric/Behavioral: Negative.      OBJECTIVE: BP 123/71 (BP Location: Right Arm, Patient Position: Sitting, Cuff Size: Normal)   Pulse (!) 56   Temp 98.1 F (36.7 C)   Resp 16   Ht '5\' 1"'$  (1.549 m)   Wt 163 lb 12.8 oz (74.3 kg)   LMP 11/23/2019 (Approximate)   SpO2 97%   BMI 30.95 kg/m   Wt Readings from Last 3 Encounters:  05/22/20 163 lb 12.8 oz (74.3 kg)  02/21/20 160 lb 6.4 oz (72.8 kg)  01/17/20 159 lb 4.8 oz (72.3 kg)     Physical Exam Constitutional:      Appearance: Normal appearance.  Cardiovascular:     Rate and Rhythm: Normal rate.     Heart sounds: Normal heart sounds.  Pulmonary:     Effort: Pulmonary effort is normal.     Breath sounds: Normal breath sounds.  Abdominal:     General: Bowel sounds are normal.     Palpations: Abdomen is soft.  Musculoskeletal:        General: Normal range of motion.     Cervical back: Normal range of motion.  Neurological:     General: No focal deficit  present.     Mental Status: She is alert.  Psychiatric:        Mood and Affect: Mood normal.     ASSESSMENT/PLAN:   1. Primary hypertension - Continue current medications in addition to losartan 25 mg daily that was added by your nephrology Dr.Munsoor Lateef - cloNIDine (CATAPRES) 0.3 MG tablet; Take 1 tablet (0.3 mg total) by mouth 3 (three) times daily.  Dispense: 90 tablet; Refill: 0 - amLODipine (NORVASC) 10 MG tablet; Take 1 tablet (10 mg total) by mouth daily.  Dispense: 90 tablet; Refill: 0 -Continiue DASH diet and daily exercise as tolerated.  2. Gastroesophageal reflux  disease without esophagitis  - omeprazole (PRILOSEC) 20 MG capsule; Take 1 capsule (20 mg total) by mouth daily.  Dispense: 90 capsule; Refill: 0  3. Nausea  - promethazine (PHENERGAN) 25 MG tablet; TAKE ONE TABLET BY MOUTH EVERY 6 HOURS AS NEEDED FOR NAUSEA OR VOMITING  Dispense: 15 tablet; Refill: 0  4. Stage 3b chronic kidney disease [facility]) - Follow-up with nephrology Dr.Munsoor Lateef in May 2022. Pt to call for appt date. Recheck CMP in 3 months.    5. Colitis  Pt to f/u once has financial assistance worked out.   6. Cerebrovascular accident (CVA) due to embolism of right middle cerebral artery ([place])  Pt refuses to take plavix due to nose bleeds. She is aware of risk of stroke or other vascular issue with stopping plavix. Pt to call neuro tomorrow and discuss med mgmt with them. Question flonase as contributor or possible need for ENT ref as well.   7. Moderate persistent asthma with acute exacerbation - Plan: albuterol (VENTOLIN HFA) 108 (90 Base) MCG/ACT inhaler, fluticasone-salmeterol (ADVAIR DISKUS) 250-50 MCG/ACT AEPB; increase advair to 250/50 since not well controlled on 100/50. Rx RF albuterol  8. Hypokalemia - Plan: potassium chloride SA (KLOR-CON) 20 MEQ tablet   9. Mixed hyperlipidemia - Plan: simvastatin (ZOCOR) 10 MG tablet  Meds ordered this encounter  Medications  . amLODipine (NORVASC) 10 MG tablet    Sig: TAKE ONE TABLET BY MOUTH EVERY DAY    Dispense:  90 tablet    Refill:  0    Order Specific Question:   Supervising Provider    Answer:   Gae Dry [id number]  . cloNIDine (CATAPRES) 0.3 MG tablet    Sig: TAKE ONE TABLET BY MOUTH 3 TIMES A DAY    Dispense:  270 tablet    Refill:  0    Order Specific Question:   Supervising Provider    Answer:   Gae Dry [id number]  . omeprazole (PRILOSEC) 20 MG capsule    Sig: TAKE ONE CAPSULE BY MOUTH EVERY DAY    Dispense:  90 capsule    Refill:  0    Order Specific Question:   Supervising Provider     Answer:   Gae Dry [id number]  . furosemide (LASIX) 40 MG tablet    Sig: TAKE ONE TABLET BY MOUTH EVERY DAY    Dispense:  90 tablet    Refill:  0    Order Specific Question:   Supervising Provider    Answer:   Gae Dry [id number]  . potassium chloride SA (KLOR-CON) 20 MEQ tablet    Sig: Take 1 tablet (20 mEq total) by mouth as needed (potassium).    Dispense:  45 tablet    Refill:  0    Order Specific Question:   Supervising Provider    Answer:  Gae Dry [id number]  . promethazine (PHENERGAN) 25 MG tablet    Sig: TAKE ONE TABLET BY MOUTH EVERY 6 HOURS AS NEEDED FOR NAUSEA OR VOMITING    Dispense:  15 tablet    Refill:  0    Order Specific Question:   Supervising Provider    Answer:   Gae Dry [id number]  . simvastatin (ZOCOR) 10 MG tablet    Sig: TAKE ONE TABLET BY MOUTH EVERY DAY    Dispense:  90 tablet    Refill:  0    Order Specific Question:   Supervising Provider    Answer:   Gae Dry [id number]  . albuterol (VENTOLIN HFA) 108 (90 Base) MCG/ACT inhaler    Sig: Inhale 2 puffs into the lungs every 4 (four) hours as needed for wheezing (cough).    Dispense:  1 each    Refill:  2    Order Specific Question:   Supervising Provider    Answer:   Gae Dry [id number]  . fluticasone-salmeterol (ADVAIR DISKUS) 250-50 MCG/ACT AEPB    Sig: Inhale 1 puff into the lungs in the morning and at bedtime.    Dispense:  1 each    Refill:  2    Order Specific Question:   Supervising Provider    Answer:   Gae Dry [id number]   Orders Placed This Encounter  Procedures  . Comprehensive metabolic panel    Standing Status:   Future    Standing Expiration Date:   05/22/2021    Order Specific Question:   Release to patient    Answer:   Immediate  . Lipid panel    Standing Status:   Future    Standing Expiration Date:   05/22/2021   Return in about 3 months (around 08/22/2020) for med f/u and labs.   Leticia Mcdiarmid, PA-C

## 2020-05-22 NOTE — Patient Instructions (Signed)
I value your feedback and you entrusting us with your care. If you get a [facility] patient survey, I would appreciate you taking the time to let us know about your experience today. Thank you! ? ? ?

## 2020-05-23 ENCOUNTER — Other Ambulatory Visit: Payer: Self-pay

## 2020-05-26 ENCOUNTER — Other Ambulatory Visit: Payer: Self-pay

## 2020-06-05 ENCOUNTER — Other Ambulatory Visit: Payer: Self-pay

## 2020-06-05 MED ORDER — AMLODIPINE BESYLATE 10 MG PO TABS
ORAL_TABLET | ORAL | 11 refills | Status: DC
  Filled 2020-06-05: qty 30, 30d supply, fill #0

## 2020-06-05 MED ORDER — CLONIDINE HCL 0.3 MG PO TABS
0.3000 mg | ORAL_TABLET | Freq: Three times a day (TID) | ORAL | 11 refills | Status: DC
  Filled 2020-06-05: qty 90, 30d supply, fill #0

## 2020-06-06 ENCOUNTER — Other Ambulatory Visit: Payer: Self-pay

## 2020-07-02 NOTE — Progress Notes (Signed)
Virtual Visit via Video Note The purpose of this virtual visit is to provide medical care while limiting exposure to the novel coronavirus.    Consent was obtained for video visit:  Yes.   Answered questions that patient had about telehealth interaction:  Yes.   I discussed the limitations, risks, security and privacy concerns of performing an evaluation and management service by telemedicine. I also discussed with the patient that there may be a patient responsible charge related to this service. The patient expressed understanding and agreed to proceed.  Pt location: Home Physician Location: office Name of referring provider:  Langston Reusing, NP I connected with [name] at patients initiation/request on 07/03/2020 at  1:30 PM EDT by video enabled telemedicine application and verified that I am speaking with the correct person using two identifiers. 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  1.Secondary stroke prevention as managed by PCP: - ASA '81mg'$  daily - Statin therapy.  LDL goal less than 70 - Normotensive blood pressure - Hgb A1c goal less than 7 2. Smoking cessation 3. Mediterranean  4. Routine exercise 5. Follow up 6 months.  History of Present Illness:  Gabrielle Schultz is a 50 year old right-handed female with HTN, CKD, COPD, tobacco use disorder, ischemic colitis and history of strokes who follows up for stroke.   UPDATE: Current medications:  ASA '81mg'$ , clonidine, amlodipine, clonidine, simvastatin '10mg'$  daily (her PCP doesn't want her on '40mg'$  because she has kidney disease).  She hasn't followed up with cardiology because she needs a copy of her tax returns for the charity program.  .  Changed back from Plavix to ASA due to nosebleed.    HISTORY: She had a stroke at age 54.  She doesn't remember her symptoms.   She had a second stroke in addition to an acute ischemic colitis in 2014.  She does not remember her symptoms but records report right homonymous hemianopsia and right arm and leg weakness and numbness of 2 months duration.   MRI of brain without contrast from 12/29/2012 demonstrated "[R]emote moderate size left occipital/posterior inferior left temporal lobe infarct with small area of involvement of the inferior left parietal lobe.  Associated encephalomalacia and laminar necrosis.  No evidence of acute infarct.  Remote small left frontal lobe infarct.  Mild to moderate nonspecific white matter type changes.Marland KitchenMarland KitchenGlobal atrophy without hydrocephalus."  She reports that no cause for her strokes were ever found.  Following her stroke, she never underwent PT/OT because she had no insurance.  Since the second stroke, she continues to have aching and cramping of her hand down to her elbow with use of her hand.  She also has numbness involving the first 3 digits of her right hand.  She has history of carpal tunnel syndrome diagnosed many years ago, so she tried wearing a brace.  NCV-EMG of right upper extremity on 11/23/2018 was normal.   She was admitted to [facility] in November 2021 for a third stroke presenting with sudden left sided weakness.  Blood pressure was in the 123456 systolic.  CT head was negative for bleed.  She received IV tPA.  MRI of brain showed small acute posterior right frontal lobe infarct.  2D echo showed EF 65-70% with no source of  embolus.  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  Blood cultures were negative.  [employer] Virus 2 negative, LDL 97, and Hgb A1c 5.7.  Hypercoagulable panel showed homocysteine 52 (thought to be secondary to smoking), antithrombin III 121, protein C  109, B2GPI negative, lupus anticoagulant panel negative and cardiolipin negative.  She was discharged on ASA and Plavix '75mg'$  daily for 3 weeks followed by Plavix alone.  Zocor was increased to '40mg'$  daily, however her PCP reduced dose due to her kidney disease.    Past Medical History: Past Medical History:  Diagnosis Date   Acid reflux    Allergic rhinitis 06/26/2015   Allergy    Asthma    Carpal tunnel syndrome    right hand   Chronic kidney disease    Colitis    CVA (cerebral infarction)    2009, 2015   Gout    History of syncope    Hypertension 2008   Low HDL (under 40) 06/26/2015   MI (myocardial infarction) ([place])    2015 - patient wasnt told by cardiologist that she had MI but has had cardiac cath in past   Migraines    Poor circulation    Renal insufficiency    Stroke [facility])    Syncope and collapse     Medications: Outpatient Encounter Medications as of 07/03/2020  Medication Sig   albuterol (VENTOLIN HFA) 108 (90 Base) MCG/ACT inhaler Inhale 2 puffs into the lungs every 4 (four) hours as needed for wheezing (cough).   allopurinol (ZYLOPRIM) 100 MG tablet TAKE ONE TABLET BY MOUTH EVERY DAY   amLODipine (NORVASC) 10 MG tablet TAKE ONE TABLET BY MOUTH EVERY DAY   aspirin 81 MG chewable tablet CHEW ONE TABLET BY MOUTH EVERY DAY FOR 21 DAYS   cetirizine (ZYRTEC) 10 MG tablet TAKE ONE TABLET BY MOUTH EVERY DAY   cloNIDine (CATAPRES) 0.3 MG tablet TAKE ONE TABLET BY MOUTH 3 TIMES A DAY   cloNIDine (CATAPRES) 0.3 MG tablet Take 1 tablet (0.3 mg total) by mouth in the morning and 1 tablet (0.3 mg total) at noon and 1 tablet (0.3 mg total) in the evening.   fluticasone-salmeterol (ADVAIR DISKUS) 250-50 MCG/ACT AEPB Inhale 1 puff into the lungs in the morning and at bedtime.   furosemide (LASIX) 40 MG tablet TAKE ONE TABLET BY MOUTH EVERY DAY   potassium chloride SA (KLOR-CON) 20 MEQ tablet Take 1 tablet (20 mEq total) by mouth once daily as needed (potassium).   simvastatin (ZOCOR)  10 MG tablet TAKE ONE TABLET BY MOUTH EVERY DAY   amLODipine (NORVASC) 10 MG tablet Take 1 tablet (10 mg total) by mouth 1 (one) time each day (Patient not taking: Reported on 07/03/2020)   calcitRIOL (ROCALTROL) 0.25 MCG capsule Take 0.25 mcg by mouth daily.  (Patient not taking: Reported on 07/03/2020)   calcitRIOL (ROCALTROL) 0.5 MCG capsule TAKE ONE CAPSULE BY MOUTH EVERY DAY (Patient not taking: Reported on 07/03/2020)   clopidogrel (PLAVIX) 75 MG tablet TAKE ONE TABLET BY MOUTH EVERY DAY (Patient not taking: Reported on 07/03/2020)   fluticasone (FLONASE) 50 MCG/ACT nasal spray PLACE 1 SPRAY INTO BOTH NOSTRILS 2 TIMES A DAY (Patient not taking: Reported on 07/03/2020)   lipase/protease/amylase (CREON) 36000 UNITS CPEP capsule Take 2 tablets with the first bite of each meal and 1 tablet with the first bite of each snack (Patient not taking:  No sig reported)   omeprazole (PRILOSEC) 20 MG capsule TAKE ONE CAPSULE BY MOUTH EVERY DAY (Patient not taking: Reported on 07/03/2020)   promethazine (PHENERGAN) 25 MG tablet TAKE ONE TABLET BY MOUTH EVERY 6 HOURS AS NEEDED FOR NAUSEA OR VOMITING (Patient not taking: Reported on 07/03/2020)   No facility-administered encounter medications on file as of 07/03/2020.    Allergies: Allergies  Allergen Reactions   Coconut Oil Hives and Swelling    Just coconut ("all coconut")   Amoxicillin Other (See Comments)    "makes me sick" "swelled up everywhere" Has patient had a PCN reaction causing immediate rash, facial/tongue/throat swelling, SOB or lightheadedness with hypotension: No Has patient had a PCN reaction causing severe rash involving mucus membranes or skin necrosis: No Has patient had a PCN reaction that required hospitalization: No Has patient had a PCN reaction occurring within the last 10 years: No If all of the above answers are "NO", then may proceed with Cephalosporin use.     Penicillins Other (See Comments)    "makes me sick" "swelled up  oil" Has patient had a PCN reaction causing immediate rash, facial/tongue/throat swelling, SOB or lightheadedness with hypotension: No Has patient had a PCN reaction causing severe rash involving mucus membranes or skin necrosis: No Has patient had a PCN reaction that required hospitalization: No Has patient had a PCN reaction occurring within the last 10 years: Yes If all of the above answers are "NO", then may proceed with Cephalosporin use.    Singulair [Montelukast Sodium] Rash    "made heart race"    Family History: Family History  Problem Relation Age of Onset   Hyperlipidemia Mother    Heart disease Mother        has a defibrillator   COPD Mother    Hypertension Father    Heart disease Father    Hyperlipidemia Father    Hypertension Sister        died in her 54s from heart failure and end stage kidney disease   Kidney disease Sister    Hypertension Brother    Heart disease Brother        has a defibrillator   Coronary artery disease Brother        had CABG   Hyperlipidemia Brother    Healthy Child    Diabetes Child     Observations/Objective:   Height '5\' 2"'$  (1.575 m), weight 160 lb (72.6 kg). No acute distress.  Alert and oriented.  Speech fluent and not dysarthric.  Language intact.    Follow Up Instructions:    -I discussed the assessment and treatment plan with the patient. The patient was provided an opportunity to ask questions and all were answered. The patient agreed with the plan and demonstrated an understanding of the instructions.   The patient was advised to call back or seek an in-person evaluation if the symptoms worsen or if the condition fails to improve as anticipated.  Dudley Major, DO  CC:  Caryl Asp, NP

## 2020-07-03 ENCOUNTER — Other Ambulatory Visit: Payer: Self-pay

## 2020-07-03 ENCOUNTER — Telehealth (INDEPENDENT_AMBULATORY_CARE_PROVIDER_SITE_OTHER): Payer: Self-pay | Admitting: Neurology

## 2020-07-03 ENCOUNTER — Encounter: Payer: Self-pay | Admitting: Neurology

## 2020-07-03 VITALS — Ht 62.0 in | Wt 160.0 lb

## 2020-07-03 DIAGNOSIS — E785 Hyperlipidemia, unspecified: Secondary | ICD-10-CM

## 2020-07-03 DIAGNOSIS — Z720 Tobacco use: Secondary | ICD-10-CM

## 2020-07-03 DIAGNOSIS — I10 Essential (primary) hypertension: Secondary | ICD-10-CM

## 2020-07-03 DIAGNOSIS — I639 Cerebral infarction, unspecified: Secondary | ICD-10-CM

## 2020-07-10 DIAGNOSIS — Z20822 Contact with and (suspected) exposure to covid-19: Secondary | ICD-10-CM | POA: Diagnosis not present

## 2020-07-15 ENCOUNTER — Other Ambulatory Visit: Payer: Self-pay

## 2020-07-18 DIAGNOSIS — Z20822 Contact with and (suspected) exposure to covid-19: Secondary | ICD-10-CM | POA: Diagnosis not present

## 2020-07-21 ENCOUNTER — Observation Stay
Admission: EM | Admit: 2020-07-21 | Discharge: 2020-07-22 | Disposition: A | Discharge status: Home / Self Care | Payer: Self-pay | Attending: Internal Medicine | Admitting: Internal Medicine

## 2020-07-21 ENCOUNTER — Encounter: Payer: Self-pay | Admitting: Emergency Medicine

## 2020-07-21 ENCOUNTER — Other Ambulatory Visit: Payer: Self-pay

## 2020-07-21 ENCOUNTER — Emergency Department: Payer: Self-pay

## 2020-07-21 DIAGNOSIS — J45909 Unspecified asthma, uncomplicated: Secondary | ICD-10-CM | POA: Insufficient documentation

## 2020-07-21 DIAGNOSIS — Z79899 Other long term (current) drug therapy: Secondary | ICD-10-CM | POA: Insufficient documentation

## 2020-07-21 DIAGNOSIS — G459 Transient cerebral ischemic attack, unspecified: Principal | ICD-10-CM | POA: Insufficient documentation

## 2020-07-21 DIAGNOSIS — Z8673 Personal history of transient ischemic attack (TIA), and cerebral infarction without residual deficits: Secondary | ICD-10-CM | POA: Diagnosis present

## 2020-07-21 DIAGNOSIS — I129 Hypertensive chronic kidney disease with stage 1 through stage 4 chronic kidney disease, or unspecified chronic kidney disease: Secondary | ICD-10-CM | POA: Insufficient documentation

## 2020-07-21 DIAGNOSIS — U071 COVID-19: Secondary | ICD-10-CM | POA: Insufficient documentation

## 2020-07-21 DIAGNOSIS — Z7982 Long term (current) use of aspirin: Secondary | ICD-10-CM | POA: Insufficient documentation

## 2020-07-21 DIAGNOSIS — F17210 Nicotine dependence, cigarettes, uncomplicated: Secondary | ICD-10-CM | POA: Insufficient documentation

## 2020-07-21 DIAGNOSIS — N1832 Chronic kidney disease, stage 3b: Secondary | ICD-10-CM | POA: Insufficient documentation

## 2020-07-21 LAB — COMPREHENSIVE METABOLIC PANEL
ALT: 8 U/L (ref 0–44)
AST: 12 U/L — ABNORMAL LOW (ref 15–41)
Albumin: 3.3 g/dL — ABNORMAL LOW (ref 3.5–5.0)
Alkaline Phosphatase: 130 U/L — ABNORMAL HIGH (ref 38–126)
Anion gap: 5 (ref 5–15)
BUN: 20 mg/dL (ref 6–20)
CO2: 25 mmol/L (ref 22–32)
Calcium: 8.8 mg/dL — ABNORMAL LOW (ref 8.9–10.3)
Chloride: 109 mmol/L (ref 98–111)
Creatinine, Ser: 1.71 mg/dL — ABNORMAL HIGH (ref 0.44–1.00)
GFR, Estimated: 37 mL/min — ABNORMAL LOW (ref >60)
Glucose, Bld: 97 mg/dL (ref 70–99)
Potassium: 3.7 mmol/L (ref 3.5–5.1)
Sodium: 139 mmol/L (ref 135–145)
Total Bilirubin: 0.5 mg/dL (ref 0.3–1.2)
Total Protein: 6.7 g/dL (ref 6.5–8.1)

## 2020-07-21 LAB — CBC WITH DIFFERENTIAL/PLATELET
Abs Immature Granulocytes: 0.02 10*3/uL (ref 0.00–0.07)
Basophils Absolute: 0.1 10*3/uL (ref 0.0–0.1)
Basophils Relative: 1 %
Eosinophils Absolute: 0.2 10*3/uL (ref 0.0–0.5)
Eosinophils Relative: 3 %
HCT: 37.5 % (ref 36.0–46.0)
Hemoglobin: 12.6 g/dL (ref 12.0–15.0)
Immature Granulocytes: 0 %
Lymphocytes Relative: 32 %
Lymphs Abs: 2.3 10*3/uL (ref 0.7–4.0)
MCH: 33.7 pg (ref 26.0–34.0)
MCHC: 33.6 g/dL (ref 30.0–36.0)
MCV: 100.3 fL — ABNORMAL HIGH (ref 80.0–100.0)
Monocytes Absolute: 0.5 10*3/uL (ref 0.1–1.0)
Monocytes Relative: 7 %
Neutro Abs: 3.9 10*3/uL (ref 1.7–7.7)
Neutrophils Relative %: 57 %
Platelets: 166 10*3/uL (ref 150–400)
RBC: 3.74 MIL/uL — ABNORMAL LOW (ref 3.87–5.11)
RDW: 15.7 % — ABNORMAL HIGH (ref 11.5–15.5)
WBC: 7 10*3/uL (ref 4.0–10.5)
nRBC: 0 % (ref 0.0–0.2)

## 2020-07-21 LAB — PROTIME-INR
INR: 1 (ref 0.8–1.2)
Prothrombin Time: 13.3 seconds (ref 11.4–15.2)

## 2020-07-21 LAB — APTT: aPTT: 34 seconds (ref 24–36)

## 2020-07-21 NOTE — ED Triage Notes (Signed)
Patient arrives via ACMES from home for stroke like symptoms. Patient aphasia and left arm heaviness PTA but have now resolved. Patient does have history of stroke and takes plavix. Patient only complains of headache at this time. Aox4.

## 2020-07-21 NOTE — ED Provider Notes (Signed)
[facility] Emergency Department Provider Note ____________________________________________   Event Date/Time   First MD Initiated Contact with Patient 07/21/20 2244     (approximate)  I have reviewed the triage vital signs and the nursing notes.  HISTORY  Chief Complaint Stroke Symptoms   HPI MARQUESHA BEOUGHER is a 50 y.o. femalewho presents to the ED for evaluation of resolving strokelike symptoms.  Chart review indicates history of stroke in the past on Plavix only without aspirin.  Patient presents to the ED for evaluation of resolving strokelike symptoms.  She reports that she was seated this evening, just scrolling on her phone, when she developed sudden onset right-sided weakness and aphasia, this lasted about 20 minutes before resolving.  She reports symptoms have just resolved by the time that I see the patient here in the ED.  She reports that she has right-sided visual field cuts at baseline as well as numbness and tingling to her first 3 fingers to her right hand at baseline without additional complaints.  Reports a headache that is now resolving.  No syncope, trauma, emesis, recent illnesses or fever.   Past Medical History:  Diagnosis Date   Acid reflux    Allergic rhinitis 06/26/2015   Allergy    Asthma    Carpal tunnel syndrome    right hand   Chronic kidney disease    Colitis    CVA (cerebral infarction)    2009, 2015   Gout    History of syncope    Hypertension 2008   Low HDL (under 40) 06/26/2015   MI (myocardial infarction) ([place])    2015 - patient wasnt told by cardiologist that she had MI but has had cardiac cath in past   Migraines    Poor circulation    Renal insufficiency    Stroke [facility])    Syncope and collapse     Patient Active Problem List   Diagnosis Date Noted   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   Cryptogenic stroke ([place])    Hyperlipidemia LDL goal <70    AKI (acute kidney injury) ([place])     Obesity    MI (myocardial infarction) ([place])    Migraines    Stroke (cerebrum) ([place]) 11/30/2019   Benign essential hypertension 12/23/2018   Edema of lower extremity 12/23/2018   Recurrent urinary tract infection 12/23/2018   Edema of both feet 09/23/2018   Urine frequency 10/06/2017   SIRS (systemic inflammatory response syndrome) ([place]) 01/18/2017   Abdominal pain 01/18/2017   Aortic regurgitation 09/08/2016   Chest pain 08/28/2016   Hypertensive emergency 07/31/2016   AMA (advanced maternal age) multigravida 35+, third trimester [id number]   Asthma complicating pregnancy, antepartum 07/30/2016   Chronic hypertension affecting pregnancy 07/30/2016   H/O myocardial ischemia 07/30/2016   H/O: C-section 07/30/2016   H/O: stroke 07/30/2016   IUGR (intrauterine growth restriction) affecting care of mother, second trimester, fetus 1 07/30/2016   Supervision of high risk pregnancy in third trimester 07/30/2016   Edema of both feet 04/22/2016   Abnormal TSH 04/22/2016   Abdominal pain, right upper quadrant 12/02/2015   Dermatitis 12/02/2015   Urinary tract infection 10/28/2015   Low HDL (under 40) 06/26/2015   Allergic rhinitis 06/26/2015   Tobacco abuse 06/26/2015   Cerebral infarct ([place]) 11/21/2013   CKD (chronic kidney disease), stage IIIb ([place]) 11/21/2013   Hypertension 08/12/2011   LVH (left ventricular hypertrophy) 08/12/2011   Chronic renal insufficiency 08/12/2011    Past Surgical History:  Procedure Laterality Date   BUBBLE STUDY  12/03/2019   Procedure: BUBBLE STUDY;  Surgeon: Geralynn Rile, MD;  Location: [employer];  Service: Cardiovascular;;   CARDIAC CATHETERIZATION     CESAREAN SECTION  1994   Dr. Ammie Dalton   COLONOSCOPY WITH PROPOFOL N/A 06/26/2014   Procedure: COLONOSCOPY WITH PROPOFOL;  Surgeon: Josefine Class, MD;  Location[facility]COPY;  Service: Endoscopy;  Laterality: N/A;   DILATION AND CURETTAGE OF UTERUS  x2 for incomplete SABs   TEE  WITHOUT CARDIOVERSION N/A 12/03/2019   Procedure: TRANSESOPHAGEAL ECHOCARDIOGRAM (TEE);  Surgeon: Geralynn Rile, MD;  Location: [place];  Service: Cardiovascular;  Laterality: N/A;    Prior to Admission medications   Medication Sig Start Date End Date Taking? Authorizing Provider  albuterol (VENTOLIN HFA) 108 (90 Base) MCG/ACT inhaler Inhale 2 puffs into the lungs every 4 (four) hours as needed for wheezing (cough). [id number]   Copland, Deirdre Evener, PA-C  allopurinol (ZYLOPRIM) 100 MG tablet TAKE ONE TABLET BY MOUTH EVERY DAY 12/13/19 08/03/20  Lanae Boast, FNP  amLODipine (NORVASC) 10 MG tablet TAKE ONE TABLET BY MOUTH EVERY DAY 05/22/20 [id number]  Copland, Elmo Putt B, PA-C  amLODipine (NORVASC) 10 MG tablet Take 1 tablet (10 mg total) by mouth 1 (one) time each day Patient not taking: Reported on 07/03/2020 06/05/20     aspirin 81 MG chewable tablet CHEW ONE TABLET BY MOUTH EVERY DAY FOR 21 DAYS 12/03/19 12/02/20  Donzetta Starch, NP  calcitRIOL (ROCALTROL) 0.25 MCG capsule Take 0.25 mcg by mouth daily.  Patient not taking: Reported on 07/03/2020    [provider]  calcitRIOL (ROCALTROL) 0.5 MCG capsule TAKE ONE CAPSULE BY MOUTH EVERY DAY Patient not taking: Reported on 07/03/2020 02/04/20 02/03/21  Anthonette Legato, MD  cetirizine (ZYRTEC) 10 MG tablet TAKE ONE TABLET BY MOUTH EVERY DAY 03/11/20 02/03/21  Iloabachie, Chioma E, NP  cloNIDine (CATAPRES) 0.3 MG tablet TAKE ONE TABLET BY MOUTH 3 TIMES A DAY 05/22/20 [id number]  Copland, Alicia B, PA-C  cloNIDine (CATAPRES) 0.3 MG tablet Take 1 tablet (0.3 mg total) by mouth in the morning and 1 tablet (0.3 mg total) at noon and 1 tablet (0.3 mg total) in the evening. 06/05/20     clopidogrel (PLAVIX) 75 MG tablet TAKE ONE TABLET BY MOUTH EVERY DAY Patient not taking: Reported on 07/03/2020 02/21/20 02/20/21  Abate, Desta A, NP  fluticasone (FLONASE) 50 MCG/ACT nasal spray PLACE 1 SPRAY INTO BOTH NOSTRILS 2 TIMES A DAY Patient not taking: Reported on  07/03/2020 12/13/19   Lanae Boast, FNP  fluticasone-salmeterol (ADVAIR DISKUS) 250-50 MCG/ACT AEPB Inhale 1 puff into the lungs in the morning and at bedtime. [id number]   Copland, Elmo Putt B, PA-C  furosemide (LASIX) 40 MG tablet TAKE ONE TABLET BY MOUTH EVERY DAY 05/22/20 [id number]  Copland, Deirdre Evener, PA-C  lipase/protease/amylase (CREON) 36000 UNITS CPEP capsule Take 2 tablets with the first bite of each meal and 1 tablet with the first bite of each snack Patient not taking: No sig reported 04/21/20   Lin Landsman, MD  omeprazole (PRILOSEC) 20 MG capsule TAKE ONE CAPSULE BY MOUTH EVERY DAY Patient not taking: Reported on 07/03/2020 05/22/20 [id number]  Copland, Elmo Putt B, PA-C  potassium chloride SA (KLOR-CON) 20 MEQ tablet Take 1 tablet (20 mEq total) by mouth once daily as needed (potassium). [id number]   Copland, Deirdre Evener, PA-C  promethazine (PHENERGAN) 25 MG tablet TAKE ONE TABLET BY MOUTH EVERY 6 HOURS AS NEEDED FOR NAUSEA OR  VOMITING Patient not taking: Reported on 07/03/2020 05/22/20 99991111  Copland, Elmo Putt B, PA-C  simvastatin (ZOCOR) 10 MG tablet TAKE ONE TABLET BY MOUTH EVERY DAY 05/22/20 99991111  Copland, Deirdre Evener, PA-C    Allergies Coconut oil, Amoxicillin, Penicillins, and Singulair [montelukast sodium]  Family History  Problem Relation Age of Onset   Hyperlipidemia Mother    Heart disease Mother        has a defibrillator   COPD Mother    Hypertension Father    Heart disease Father    Hyperlipidemia Father    Hypertension Sister        died in her 88s from heart failure and end stage kidney disease   Kidney disease Sister    Hypertension Brother    Heart disease Brother        has a defibrillator   Coronary artery disease Brother        had CABG   Hyperlipidemia Brother    Healthy Child    Diabetes Child     Social History Social History   Tobacco Use   Smoking status: Every Day    Packs/day: 0.50    Years: 33.00    Pack years: 16.50    Types: Cigarettes    Start  date: 03/04/2017   Smokeless tobacco: Never  Vaping Use   Vaping Use: Never used  Substance Use Topics   Alcohol use: Yes    Comment: "rare" - drinks 350-769m when she does drink   Drug use: No    Review of Systems  Constitutional: No fever/chills Eyes: No visual changes. ENT: No sore throat. Cardiovascular: Denies chest pain. Respiratory: Denies shortness of breath. Gastrointestinal: No abdominal pain.  No nausea, no vomiting.  No diarrhea.  No constipation. Genitourinary: Negative for dysuria. Musculoskeletal: Negative for back pain. Skin: Negative for rash. Neurological: Positive for headache and focal weakness, resolving.  ____________________________________________   PHYSICAL EXAM:  VITAL SIGNS: Vitals:   07/21/20 2222 07/21/20 2229  BP:  (!) 143/60  Pulse:  66  Resp:  18  Temp:  98.4 F (36.9 C)  SpO2: 98% 100%     Constitutional: Alert and oriented. Well appearing and in no acute distress. Eyes: Conjunctivae are normal. PERRL. EOMI. Head: Atraumatic. Nose: No congestion/rhinnorhea. Mouth/Throat: Mucous membranes are moist.  Oropharynx non-erythematous. Neck: No stridor. No cervical spine tenderness to palpation. Cardiovascular: Normal rate, regular rhythm. Grossly normal heart sounds.  Good peripheral circulation. Respiratory: Normal respiratory effort.  No retractions. Lungs CTAB. Gastrointestinal: Soft , nondistended, nontender to palpation. No CVA tenderness. Musculoskeletal: No lower extremity tenderness nor edema.  No joint effusions. No signs of acute trauma. Neurologic:  Normal speech and language. No gross focal neurologic deficits are appreciated.  Cranial nerves II through XII intact 5/5 strength and sensation in all 4 extremities Skin:  Skin is warm, dry and intact. No rash noted. Psychiatric: Mood and affect are normal. Speech and behavior are normal.  ____________________________________________   LABS (all labs ordered are listed, but  only abnormal results are displayed)  Labs Reviewed - No data to display ____________________________________________  12 Lead EKG  Sinus rhythm, rate of 63 bpm.  Normal axis and intervals.  Stigmata of LVH.  Nonspecific ST changes inferiorly and laterally without STEMI criteria. ____________________________________________  RADIOLOGY  ED MD interpretation: CT head pending  Official radiology report(s): No results found.  ____________________________________________   PROCEDURES and INTERVENTIONS  Procedure(s) performed (including Critical Care):  .1-3 Lead EKG Interpretation  Date/Time: 07/21/2020 10:53 PM  Performed by: Vladimir Crofts, MD Authorized by: Vladimir Crofts, MD     Interpretation: normal     ECG rate:  68   ECG rate assessment: normal     Rhythm: sinus rhythm     Ectopy: none     Conduction: normal    Medications - No data to display  ____________________________________________   MDM / ED COURSE   50 year old woman with history of stroke and strong family history of vasculopathy presents to the ED after TIA, now at her neurologic baseline without evidence of acute stroke.  Minimal hypertension that we will allow, otherwise normal vitals.  Exam without evidence of acute neurologic deficits.  She has right-sided visual field cuts to the periphery of her vision at baseline and some sensorium changes to her right first 3 fingers, otherwise unremarkable.  Blood work and CT head pending at the time of signout to oncoming provider.  Anticipate medical admission for TIA work-up and patient is in agreement with this pending remainder of ED work-up.     ____________________________________________   FINAL CLINICAL IMPRESSION(S) / ED DIAGNOSES  Final diagnoses:  TIA (transient ischemic attack)     ED Discharge Orders     None        Olina Melfi   Note:  This document was prepared using Dragon voice recognition software and may include unintentional  dictation errors.    Vladimir Crofts, MD 07/21/20 [phone]

## 2020-07-21 NOTE — ED Notes (Signed)
Patient  To CT via stretcher

## 2020-07-22 ENCOUNTER — Observation Stay: Payer: Self-pay

## 2020-07-22 ENCOUNTER — Observation Stay
Admit: 2020-07-22 | Discharge: 2020-07-22 | Disposition: A | Discharge status: Home / Self Care | Payer: Self-pay | Attending: Internal Medicine | Admitting: Internal Medicine

## 2020-07-22 DIAGNOSIS — R4701 Aphasia: Secondary | ICD-10-CM

## 2020-07-22 DIAGNOSIS — G8191 Hemiplegia, unspecified affecting right dominant side: Secondary | ICD-10-CM

## 2020-07-22 DIAGNOSIS — I10 Essential (primary) hypertension: Secondary | ICD-10-CM

## 2020-07-22 DIAGNOSIS — Z8673 Personal history of transient ischemic attack (TIA), and cerebral infarction without residual deficits: Secondary | ICD-10-CM | POA: Diagnosis present

## 2020-07-22 DIAGNOSIS — G459 Transient cerebral ischemic attack, unspecified: Secondary | ICD-10-CM | POA: Diagnosis present

## 2020-07-22 LAB — LIPID PANEL
Cholesterol: 187 mg/dL (ref 0–200)
HDL: 31 mg/dL — ABNORMAL LOW (ref >40)
LDL Cholesterol: 119 mg/dL — ABNORMAL HIGH (ref 0–99)
Total CHOL/HDL Ratio: 6 RATIO
Triglycerides: 185 mg/dL — ABNORMAL HIGH (ref <150)
VLDL: 37 mg/dL (ref 0–40)

## 2020-07-22 LAB — ECHOCARDIOGRAM COMPLETE BUBBLE STUDY
AR max vel: 3.05 cm2
AV Area VTI: 3.26 cm2
AV Area mean vel: 3.17 cm2
AV Mean grad: 4 mmHg
AV Peak grad: 7.3 mmHg
Ao pk vel: 1.35 m/s
Area-P 1/2: 6.54 cm2
S' Lateral: 2.9 cm

## 2020-07-22 LAB — URINALYSIS, COMPLETE (UACMP) WITH MICROSCOPIC
Bilirubin Urine: NEGATIVE
Glucose, UA: NEGATIVE mg/dL
Hgb urine dipstick: NEGATIVE
Ketones, ur: NEGATIVE mg/dL
Nitrite: NEGATIVE
Protein, ur: 100 mg/dL — AB
Specific Gravity, Urine: 1.013 (ref 1.005–1.030)
WBC, UA: >50 WBC/hpf — ABNORMAL HIGH (ref 0–5)
pH: 5 (ref 5.0–8.0)

## 2020-07-22 LAB — RESP PANEL BY RT-PCR (FLU A&B, COVID) ARPGX2
Influenza A by PCR: NEGATIVE
Influenza B by PCR: NEGATIVE
SARS Coronavirus 2 by RT PCR: POSITIVE — AB

## 2020-07-22 LAB — HEMOGLOBIN A1C
Hgb A1c MFr Bld: 5.9 % — ABNORMAL HIGH (ref 4.8–5.6)
Mean Plasma Glucose: 122.63 mg/dL

## 2020-07-22 MED ORDER — STROKE: EARLY STAGES OF RECOVERY BOOK: Freq: Once | Status: DC

## 2020-07-22 MED ORDER — CLOPIDOGREL BISULFATE 75 MG PO TABS
75.0000 mg | ORAL_TABLET | Freq: Once | ORAL | Status: AC
  Administered 2020-07-22: 75 mg via ORAL
  Filled 2020-07-22: qty 1

## 2020-07-22 MED ORDER — POTASSIUM CHLORIDE CRYS ER 20 MEQ PO TBCR: 20.0000 meq | EXTENDED_RELEASE_TABLET | ORAL | Status: DC | PRN

## 2020-07-22 MED ORDER — FUROSEMIDE 40 MG PO TABS
40.0000 mg | ORAL_TABLET | Freq: Every day | ORAL | Status: DC
  Administered 2020-07-22: 40 mg via ORAL
  Filled 2020-07-22: qty 1

## 2020-07-22 MED ORDER — CLOPIDOGREL BISULFATE 75 MG PO TABS: 75.0000 mg | ORAL_TABLET | Freq: Every day | ORAL | 0 refills | Status: AC

## 2020-07-22 MED ORDER — ALLOPURINOL 100 MG PO TABS
100.0000 mg | ORAL_TABLET | Freq: Every day | ORAL | Status: DC
  Administered 2020-07-22: 100 mg via ORAL
  Filled 2020-07-22: qty 1

## 2020-07-22 MED ORDER — ONDANSETRON HCL 4 MG/2ML IJ SOLN: 4.0000 mg | INTRAMUSCULAR | Status: DC | PRN

## 2020-07-22 MED ORDER — LORAZEPAM 2 MG/ML IJ SOLN
0.5000 mg | INTRAMUSCULAR | Status: DC | PRN
  Administered 2020-07-22 (×2): 0.5 mg via INTRAVENOUS
  Filled 2020-07-22 (×2): qty 1

## 2020-07-22 MED ORDER — ALBUTEROL SULFATE HFA 108 (90 BASE) MCG/ACT IN AERS
2.0000 | INHALATION_SPRAY | RESPIRATORY_TRACT | Status: DC | PRN
  Filled 2020-07-22: qty 6.7

## 2020-07-22 MED ORDER — ASPIRIN 81 MG PO TBEC: 81.0000 mg | DELAYED_RELEASE_TABLET | Freq: Every day | ORAL | 0 refills | Status: DC

## 2020-07-22 MED ORDER — CLOPIDOGREL BISULFATE 75 MG PO TABS: 75.0000 mg | ORAL_TABLET | Freq: Every day | ORAL | Status: DC

## 2020-07-22 MED ORDER — SIMVASTATIN 10 MG PO TABS
20.0000 mg | ORAL_TABLET | Freq: Every day | ORAL | Status: DC
  Administered 2020-07-22: 20 mg via ORAL
  Filled 2020-07-22: qty 2

## 2020-07-22 MED ORDER — TRAZODONE HCL 50 MG PO TABS
25.0000 mg | ORAL_TABLET | Freq: Every evening | ORAL | Status: DC | PRN
Start: 2020-07-22 — End: 2020-07-22

## 2020-07-22 MED ORDER — SODIUM CHLORIDE 0.9 % IV SOLN: INTRAVENOUS | Status: DC

## 2020-07-22 MED ORDER — MAGNESIUM HYDROXIDE 400 MG/5ML PO SUSP
30.0000 mL | Freq: Every day | ORAL | Status: DC | PRN
Start: 2020-07-22 — End: 2020-07-22

## 2020-07-22 MED ORDER — ASPIRIN EC 81 MG PO TBEC
81.0000 mg | DELAYED_RELEASE_TABLET | Freq: Every day | ORAL | Status: DC
  Administered 2020-07-22: 81 mg via ORAL
  Filled 2020-07-22: qty 1

## 2020-07-22 MED ORDER — CLONIDINE HCL 0.1 MG PO TABS
0.3000 mg | ORAL_TABLET | Freq: Three times a day (TID) | ORAL | Status: DC
  Administered 2020-07-22: 0.3 mg via ORAL
  Filled 2020-07-22: qty 3

## 2020-07-22 MED ORDER — ACETAMINOPHEN 160 MG/5ML PO SOLN
650.0000 mg | ORAL | Status: DC | PRN
  Filled 2020-07-22: qty 20.3

## 2020-07-22 MED ORDER — ENOXAPARIN SODIUM 40 MG/0.4ML IJ SOSY
40.0000 mg | PREFILLED_SYRINGE | INTRAMUSCULAR | Status: DC
  Administered 2020-07-22: 40 mg via SUBCUTANEOUS
  Filled 2020-07-22: qty 0.4

## 2020-07-22 MED ORDER — ASPIRIN 81 MG PO CHEW
324.0000 mg | CHEWABLE_TABLET | Freq: Once | ORAL | Status: AC
  Administered 2020-07-22: 324 mg via ORAL
  Filled 2020-07-22: qty 4

## 2020-07-22 MED ORDER — LORATADINE 10 MG PO TABS
10.0000 mg | ORAL_TABLET | Freq: Every day | ORAL | Status: DC
  Administered 2020-07-22: 10 mg via ORAL
  Filled 2020-07-22: qty 1

## 2020-07-22 MED ORDER — FLUTICASONE PROPIONATE 50 MCG/ACT NA SUSP
1.0000 | Freq: Every day | NASAL | Status: DC | PRN
  Filled 2020-07-22: qty 16

## 2020-07-22 MED ORDER — SENNOSIDES-DOCUSATE SODIUM 8.6-50 MG PO TABS: 1.0000 | ORAL_TABLET | Freq: Every evening | ORAL | Status: DC | PRN

## 2020-07-22 MED ORDER — ACETAMINOPHEN 325 MG PO TABS: 650.0000 mg | ORAL_TABLET | ORAL | Status: DC | PRN

## 2020-07-22 MED ORDER — ATORVASTATIN CALCIUM 40 MG PO TABS: 40.0000 mg | ORAL_TABLET | Freq: Every day | ORAL | 0 refills | Status: DC

## 2020-07-22 MED ORDER — ACETAMINOPHEN 650 MG RE SUPP: 650.0000 mg | RECTAL | Status: DC | PRN

## 2020-07-22 NOTE — ED Notes (Addendum)
Rassa from lab called stating Covid results were positive, read back. MD notified and aware.

## 2020-07-22 NOTE — ED Notes (Signed)
Pt transported to MRI 

## 2020-07-22 NOTE — Progress Notes (Signed)
SLP Cancellation Note  Patient Details Name: Gabrielle Schultz MRN: [id number] DOB: 1970/11/29   Cancelled treatment:       Reason Eval/Treat Not Completed: SLP screened, no needs identified, will sign off (chart reviewed; consulted NSG then met w/ pt). Pt denied any difficulty swallowing and is currently on a regular diet; tolerates swallowing pills w/ water per NSG. Pt conversed in conversation w/out expressive/receptive deficits noted; pt denied any speech-language deficits. Speech clear. Pt continued conversation on the phone during this screen w/ no overt deficits noted.  No further skilled ST services indicated as pt appears at her baseline. Pt agreed. NSG to reconsult if any change in status while admitted.      Orinda Kenner, MS, CCC-SLP Speech Language Pathologist Rehab Services [phone] [facility] 07/22/2020, 3:34 PM

## 2020-07-22 NOTE — H&P (Signed)
[place]   PATIENT NAME: Gabrielle Schultz    MR#:  [id number]  DATE OF BIRTH:  [birth year]  DATE OF ADMISSION:  07/21/2020  PRIMARY CARE PHYSICIAN: Langston Reusing, NP   Patient is coming from: Home  REQUESTING/REFERRING PHYSICIAN: Lurline Hare, MD  CHIEF COMPLAINT:   Chief Complaint  Patient presents with   Stroke Symptoms    HISTORY OF PRESENT ILLNESS:  Gabrielle Schultz is a 50 y.o. Caucasian female with medical history significant for GERD, asthma, chronic kidney disease, CVA X3, gout, hypertension, dyslipidemia, presented to the emergency room with acute onset of right upper and lower extremity weakness that started before she came to the ER with associated expressive aphasia.  She denied any dysphagia.  No tinnitus or vertigo.  No paresthesias.  While she was in the ER the patient had resolution of her weakness and therefore she was not considered to be a candidate for tPA.  She denied any chest pain or palpitations.  No cough or wheezing hemoptysis.  No urinary or stool incontinence.  No witnessed seizures.  No dysuria, oliguria or hematuria or flank pain.  ED Course: Upon presentation to the ER blood pressure was 143/60 with otherwise normal vital signs.  Labs revealed BMP with a creatinine 1.7 close to previous levels.  Albumin was 3.3.  CBC was unremarkable.  Influenza antigens came back negative and COVID-19 PCR came back positive. EKG as reviewed by me : Showed normal sinus rhythm with a rate of 63 with abnormal R wave progression and LVH with IVCD and repolarization abnormalities with Q waves laterally and T wave inversion. Imaging: Noncontrast head CT scan revealed no acute intracranial normalities.  The patient was given 4 baby aspirin and 75 mg p.o. Plavix.  She was admitted to an observation medical monitored bed for further evaluation and management.  PAST MEDICAL HISTORY:   Past Medical History:  Diagnosis Date   Acid reflux    Allergic rhinitis 06/26/2015    Allergy    Asthma    Carpal tunnel syndrome    right hand   Chronic kidney disease    Colitis    CVA (cerebral infarction)    2009, 2015   Gout    History of syncope    Hypertension 2008   Low HDL (under 40) 06/26/2015   MI (myocardial infarction) ([place])    2015 - patient wasnt told by cardiologist that she had MI but has had cardiac cath in past   Migraines    Poor circulation    Renal insufficiency    Stroke [facility])    Syncope and collapse     PAST SURGICAL HISTORY:   Past Surgical History:  Procedure Laterality Date   BUBBLE STUDY  12/03/2019   Procedure: BUBBLE STUDY;  Surgeon: Geralynn Rile, MD;  Location: [place];  Service: Cardiovascular;;   CARDIAC CATHETERIZATION     CESAREAN SECTION  1994   Dr. Ammie Dalton   COLONOSCOPY WITH PROPOFOL N/A 06/26/2014   Procedure: COLONOSCOPY WITH PROPOFOL;  Surgeon: Josefine Class, MD;  Location[facility] ENDOSCOPY;  Service: Endoscopy;  Laterality: N/A;   DILATION AND CURETTAGE OF UTERUS  x2 for incomplete SABs   TEE WITHOUT CARDIOVERSION N/A 12/03/2019   Procedure: TRANSESOPHAGEAL ECHOCARDIOGRAM (TEE);  Surgeon: Geralynn Rile, MD;  Location: [place];  Service: Cardiovascular;  Laterality: N/A;    SOCIAL HISTORY:   Social History   Tobacco Use   Smoking status: Every Day  Packs/day: 0.50    Years: 33.00    Pack years: 16.50    Types: Cigarettes    Start date: 03/04/2017   Smokeless tobacco: Never  Substance Use Topics   Alcohol use: Yes    Comment: "rare" - drinks 350-74m when she does drink    FAMILY HISTORY:   Family History  Problem Relation Age of Onset   Hyperlipidemia Mother    Heart disease Mother        has a defibrillator   COPD Mother    Hypertension Father    Heart disease Father    Hyperlipidemia Father    Hypertension Sister        died in her 351sfrom heart failure and end stage kidney disease   Kidney disease Sister    Hypertension Brother    Heart disease Brother         has a defibrillator   Coronary artery disease Brother        had CABG   Hyperlipidemia Brother    Healthy Child    Diabetes Child     DRUG ALLERGIES:   Allergies  Allergen Reactions   Coconut Oil Hives and Swelling    Just coconut ("all coconut")   Amoxicillin Other (See Comments)    "makes me sick" "swelled up everywhere" Has patient had a PCN reaction causing immediate rash, facial/tongue/throat swelling, SOB or lightheadedness with hypotension: No Has patient had a PCN reaction causing severe rash involving mucus membranes or skin necrosis: No Has patient had a PCN reaction that required hospitalization: No Has patient had a PCN reaction occurring within the last 10 years: No If all of the above answers are "NO", then may proceed with Cephalosporin use.     Penicillins Other (See Comments)    "makes me sick" "swelled up oil" Has patient had a PCN reaction causing immediate rash, facial/tongue/throat swelling, SOB or lightheadedness with hypotension: No Has patient had a PCN reaction causing severe rash involving mucus membranes or skin necrosis: No Has patient had a PCN reaction that required hospitalization: No Has patient had a PCN reaction occurring within the last 10 years: Yes If all of the above answers are "NO", then may proceed with Cephalosporin use.    Singulair [Montelukast Sodium] Rash    "made heart race"    REVIEW OF SYSTEMS:   ROS As per history of present illness. All pertinent systems were reviewed above. Constitutional, HEENT, cardiovascular, respiratory, GI, GU, musculoskeletal, neuro, psychiatric, endocrine, integumentary and hematologic systems were reviewed and are otherwise negative/unremarkable except for positive findings mentioned above in the HPI.   MEDICATIONS AT HOME:   Prior to Admission medications   Medication Sig Start Date End Date Taking? Authorizing Provider  albuterol (VENTOLIN HFA) 108 (90 Base) MCG/ACT inhaler Inhale 2  puffs into the lungs every 4 (four) hours as needed for wheezing (cough). [id number]  Copland, ADeirdre Evener PA-C  allopurinol (ZYLOPRIM) 100 MG tablet TAKE ONE TABLET BY MOUTH EVERY DAY 12/13/19 08/03/20  DLanae Boast FNP  amLODipine (NORVASC) 10 MG tablet TAKE ONE TABLET BY MOUTH EVERY DAY 05/22/20 [id number] Copland, AElmo PuttB, PA-C  amLODipine (NORVASC) 10 MG tablet Take 1 tablet (10 mg total) by mouth 1 (one) time each day Patient not taking: Reported on 07/03/2020 06/05/20     aspirin 81 MG chewable tablet CHEW ONE TABLET BY MOUTH EVERY DAY FOR 21 DAYS 12/03/19 12/02/20  BDonzetta Starch NP  calcitRIOL (ROCALTROL) 0.25 MCG capsule Take 0.25 mcg  by mouth daily.  Patient not taking: Reported on 07/03/2020    [provider]  calcitRIOL (ROCALTROL) 0.5 MCG capsule TAKE ONE CAPSULE BY MOUTH EVERY DAY Patient not taking: Reported on 07/03/2020 02/04/20 02/03/21  Anthonette Legato, MD  cetirizine (ZYRTEC) 10 MG tablet TAKE ONE TABLET BY MOUTH EVERY DAY 03/11/20 02/03/21  Iloabachie, Chioma E, NP  cloNIDine (CATAPRES) 0.3 MG tablet TAKE ONE TABLET BY MOUTH 3 TIMES A DAY 05/22/20 [id number]  Copland, Alicia B, PA-C  cloNIDine (CATAPRES) 0.3 MG tablet Take 1 tablet (0.3 mg total) by mouth in the morning and 1 tablet (0.3 mg total) at noon and 1 tablet (0.3 mg total) in the evening. 06/05/20     clopidogrel (PLAVIX) 75 MG tablet TAKE ONE TABLET BY MOUTH EVERY DAY Patient not taking: Reported on 07/03/2020 02/21/20 02/20/21  Abate, Desta A, NP  fluticasone (FLONASE) 50 MCG/ACT nasal spray PLACE 1 SPRAY INTO BOTH NOSTRILS 2 TIMES A DAY Patient not taking: Reported on 07/03/2020 12/13/19   Lanae Boast, FNP  fluticasone-salmeterol (ADVAIR DISKUS) 250-50 MCG/ACT AEPB Inhale 1 puff into the lungs in the morning and at bedtime. [id number]   Copland, Elmo Putt B, PA-C  furosemide (LASIX) 40 MG tablet TAKE ONE TABLET BY MOUTH EVERY DAY 05/22/20 [id number]  Copland, Deirdre Evener, PA-C  lipase/protease/amylase (CREON) 36000 UNITS CPEP capsule  Take 2 tablets with the first bite of each meal and 1 tablet with the first bite of each snack Patient not taking: No sig reported 04/21/20   Lin Landsman, MD  omeprazole (PRILOSEC) 20 MG capsule TAKE ONE CAPSULE BY MOUTH EVERY DAY Patient not taking: Reported on 07/03/2020 05/22/20 [id number]  Copland, Elmo Putt B, PA-C  potassium chloride SA (KLOR-CON) 20 MEQ tablet Take 1 tablet (20 mEq total) by mouth once daily as needed (potassium). [id number]   Copland, Deirdre Evener, PA-C  promethazine (PHENERGAN) 25 MG tablet TAKE ONE TABLET BY MOUTH EVERY 6 HOURS AS NEEDED FOR NAUSEA OR VOMITING Patient not taking: Reported on 07/03/2020 05/22/20 [id number]  Copland, Elmo Putt B, PA-C  simvastatin (ZOCOR) 10 MG tablet TAKE ONE TABLET BY MOUTH EVERY DAY 05/22/20 [id number]  Copland, Deirdre Evener, PA-C      VITAL SIGNS:  Blood pressure 128/70, pulse 62, temperature 98.4 F (36.9 C), temperature source Oral, resp. rate 18, height '5\' 1"'$  (1.549 m), weight 69.9 kg, SpO2 100 %.  PHYSICAL EXAMINATION:  Physical Exam  GENERAL:  50 y.o.-year-old Caucasian female patient lying in the bed with no acute distress.  EYES: Pupils equal, round, reactive to light and accommodation. No scleral icterus. Extraocular muscles intact.  HEENT: Head atraumatic, normocephalic. Oropharynx and nasopharynx clear.  NECK:  Supple, no jugular venous distention. No thyroid enlargement, no tenderness.  LUNGS: Normal breath sounds bilaterally, no wheezing, rales,rhonchi or crepitation. No use of accessory muscles of respiration.  CARDIOVASCULAR: Regular rate and rhythm, S1, S2 normal. No murmurs, rubs, or gallops.  ABDOMEN: Soft, nondistended, nontender. Bowel sounds present. No organomegaly or mass.  EXTREMITIES: No pedal edema, cyanosis, or clubbing.  NEUROLOGIC: Cranial nerves II through XII are intact. Muscle strength 5/5 in all extremities. Sensation intact. Gait not checked.  PSYCHIATRIC: The patient is alert and oriented x 3.  Normal affect and good  eye contact. SKIN: No obvious rash, lesion, or ulcer.   LABORATORY PANEL:   CBC Recent Labs  Lab 07/21/20 2256  WBC 7.0  HGB 12.6  HCT 37.5  PLT 166   ------------------------------------------------------------------------------------------------------------------  Chemistries  Recent Labs  Lab 07/21/20 2256  NA 139  K 3.7  CL 109  CO2 25  GLUCOSE 97  BUN 20  CREATININE 1.71*  CALCIUM 8.8*  AST 12*  ALT 8  ALKPHOS 130*  BILITOT 0.5   ------------------------------------------------------------------------------------------------------------------  Cardiac Enzymes No results for input(s): TROPONINI in the last 168 hours. ------------------------------------------------------------------------------------------------------------------  RADIOLOGY:  CT Head Wo Contrast  Result Date: 07/21/2020 CLINICAL DATA:  History of stroke.  TIA. EXAM: CT HEAD WITHOUT CONTRAST TECHNIQUE: Contiguous axial images were obtained from the base of the skull through the vertex without intravenous contrast. COMPARISON:  MRI head 12/01/2019, CT head 11/30/2019 FINDINGS: Brain: Chronic left occipital infarction. No evidence of large-territorial acute infarction. No parenchymal hemorrhage. No mass lesion. No extra-axial collection. No mass effect or midline shift. No hydrocephalus. Basilar cisterns are patent. Vascular: No hyperdense vessel. Atherosclerotic calcifications are present within the cavernous internal carotid arteries. Skull: No acute fracture or focal lesion. Sinuses/Orbits: Paranasal sinuses and mastoid air cells are clear. The orbits are unremarkable. Other: None. IMPRESSION: No acute intracranial abnormality. Electronically Signed   By: Iven Finn M.D.   On: 07/21/2020 23:50      IMPRESSION AND PLAN:  Active Problems:   TIA (transient ischemic attack)  1.  Transient ischemic attack with resolved right-sided hemiparesis and expressive aphasia. - The patient will be  admitted to a medical monitored observation bed. - We will follow neurochecks every 4 hours for 24 hours. - We will place the patient on enteric-coated baby aspirin and Plavix. - She has had nosebleeds before with Plavix and was advised to take it every other day and stop aspirin. - I told her that the benefit at this time outweighs the risks. - Neurology consult will be obtained. - I notified Dr. Leonel Ramsay about the patient. - PT/OT and ST consults will be obtained. - We will continue patient on statin with higher dose and check fasting lipids.  2.  Essential hypertension. - We will continue her antihypertensives with permissive parameters.  3.  Dyslipidemia. - We will continue statin therapy at a higher dose.  4.  Gout. - We will continue allopurinol.  5.  Asthma without exacerbation. - The patient will be continued on as needed albuterol and I will hold off long-acting beta agonist with Advair Diskus.  DVT prophylaxis: Lovenox. Code Status: full code. Family Communication:  The plan of care was discussed in details with the patient (and family). I answered all questions. The patient agreed to proceed with the above mentioned plan. Further management will depend upon hospital course. Disposition Plan: Back to previous home environment Consults called: Neurology.  All the records are reviewed and case discussed with ED provider.  Status is: Observation  The patient remains OBS appropriate and will d/c before 2 midnights.  Dispo: The patient is from: Home              Anticipated d/c is to: Home              Patient currently is not medically stable to d/c.   Difficult to place patient No   TOTAL TIME TAKING CARE OF THIS PATIENT: 55 minutes.    Christel Mormon M.D on 07/22/2020 at 12:07 AM  [facility]   From 7 PM-7 AM, contact night-coverage [email or web address]  CC: Primary care physician; Langston Reusing, NP

## 2020-07-22 NOTE — ED Notes (Signed)
Pt speaking with MRI and being screened. States she will need a sedative prior to getting the MRI done. MD aware and order placed for Ativan IV 0.'5mg'$ 

## 2020-07-22 NOTE — ED Notes (Signed)
Pt return from MRI

## 2020-07-22 NOTE — Discharge Summary (Signed)
Physician Discharge Summary  Gabrielle Schultz [id number] DOB: [birth year] DOA: 07/21/2020  PCP: Ranae Plumber, PA  Admit date: 07/21/2020 Discharge date: 07/22/2020  Admitted From: Home Discharge disposition: Home   Code Status: Full Code   Discharge Diagnosis:   Active Problems:   TIA (transient ischemic attack)    Chief Complaint  Patient presents with   Stroke Symptoms   Brief narrative: Gabrielle Schultz is a 50 y.o. female with PMH significant for HTN, HLD, CVA x3, chronic kidney disease, GERD, asthma. Patient presented to the ED on 7/18 with complaint of acute onset of right upper and lower extremity weakness with associated expressive aphasia.    Soon after arrival to the ED, her symptoms resolved and hence she was not considered for tPA.   In the ED, hemodynamically stable. Labs with creatinine elevated 1.6 which is close to her baseline COVID PCR positive EKG with normal sinus rhythm at the rate of 63, LVH pattern, nonspecific ST-T wave normality. Noncontrast head CT did not show any acute intracranial abnormality. Patient was admitted to hospital service for TIA evaluation MRI brain did not show any acute finding but showed remote cortical infarcts, most extensive in the left occipital lobe.   MRA head and neck did not show any evidence of large vessel occlusion.  Subjective: Patient was seen and examined this morning.  Not in distress.  Weakness resolved.  Hospital course Transient ischemic attack History of stroke -Presented with right-sided hemiparesis and expressive aphasia which resolved in the ED -Imaging findings as above without any acute findings but showed remote cortical infarcts, most extensive in the left occipital lobe. -Admitted for TIA work-up. -Neurology consult appreciated. -Imagings as above did not show any acute intracranial finding. -Carotid duplex did not show any significant stenosis. -PT/OT/ST eval obtained. -A1c 5.9, HDL 31, LDL  119 -Prior to admission, patient was apparently on Plavix and statin however not taking consistently. -Per neurology recommendation, will discharge the patient on DAPT with aspirin 81 mg daily and Plavix 75 mg daily for next 3 weeks followed by aspirin monotherapy alone.  She is also started on atorvastatin 40 mg daily.  Essential hypertension -Home meds include amlodipine 10 mg daily, clonidine 0.3 mg 3 times daily, Lasix 40 mg daily -Continue the same.  Hyperlipidemia -Atorvastatin  Gout -Continue allopurinol  Asthma -As needed bronchodilators  Moderate aortic regurgitation -On chart review I noted that in November 2021, as part of a stroke work-up, patient had a TEE done which showed type III dysfunction of aortic valve with moderate regurgitation.  Its appearance was concerning for inflammatory condition such as Libman-Sacks endocarditis versus healed vegetations. -She was discharged to follow-up with Dr. Davina Poke at [facility] cardiology -Repeat echocardiogram today 7/19 does not report any such abnormality and aortic valve.    COVID test positive -No respiratory symptoms.   Allergies as of 07/22/2020       Reactions   Coconut Oil Hives, Swelling   Just coconut ("all coconut")   Amoxicillin Other (See Comments)   "makes me sick" "swelled up everywhere" Has patient had a PCN reaction causing immediate rash, facial/tongue/throat swelling, SOB or lightheadedness with hypotension: No Has patient had a PCN reaction causing severe rash involving mucus membranes or skin necrosis: No Has patient had a PCN reaction that required hospitalization: No Has patient had a PCN reaction occurring within the last 10 years: No If all of the above answers are "NO", then may proceed with Cephalosporin use.   Penicillins Other (See  Comments)   "makes me sick" "swelled up oil" Has patient had a PCN reaction causing immediate rash, facial/tongue/throat swelling, SOB or lightheadedness with  hypotension: No Has patient had a PCN reaction causing severe rash involving mucus membranes or skin necrosis: No Has patient had a PCN reaction that required hospitalization: No Has patient had a PCN reaction occurring within the last 10 years: Yes If all of the above answers are "NO", then may proceed with Cephalosporin use.   Singulair [montelukast Sodium] Rash   "made heart race"        Medication List     STOP taking these medications    aspirin 81 MG chewable tablet Replaced by: aspirin 81 MG EC tablet   calcitRIOL 0.25 MCG capsule Commonly known as: ROCALTROL   calcitRIOL 0.5 MCG capsule Commonly known as: ROCALTROL   lipase/protease/amylase 36000 UNITS Cpep capsule Commonly known as: Creon   simvastatin 10 MG tablet Commonly known as: ZOCOR       TAKE these medications    acetaminophen 500 MG tablet Commonly known as: TYLENOL Take 500 mg by mouth every 6 (six) hours as needed for fever or mild pain.   Advair Diskus 250-50 MCG/ACT Aepb Generic drug: fluticasone-salmeterol Inhale 1 puff into the lungs in the morning and at bedtime.   albuterol 108 (90 Base) MCG/ACT inhaler Commonly known as: Ventolin HFA Inhale 2 puffs into the lungs every 4 (four) hours as needed for wheezing (cough).   allopurinol 100 MG tablet Commonly known as: ZYLOPRIM TAKE ONE TABLET BY MOUTH EVERY DAY   amLODipine 10 MG tablet Commonly known as: NORVASC TAKE ONE TABLET BY MOUTH EVERY DAY What changed: Another medication with the same name was removed. Continue taking this medication, and follow the directions you see here.   aspirin 81 MG EC tablet Take 1 tablet (81 mg total) by mouth daily. Swallow whole. Start taking on: July 23, 2020 Replaces: aspirin 81 MG chewable tablet   atorvastatin 40 MG tablet Commonly known as: Lipitor Take 1 tablet (40 mg total) by mouth daily.   cetirizine 10 MG tablet Commonly known as: ZYRTEC TAKE ONE TABLET BY MOUTH EVERY DAY    cloNIDine 0.3 MG tablet Commonly known as: CATAPRES Take 1 tablet (0.3 mg total) by mouth in the morning and 1 tablet (0.3 mg total) at noon and 1 tablet (0.3 mg total) in the evening. What changed: Another medication with the same name was removed. Continue taking this medication, and follow the directions you see here.   clopidogrel 75 MG tablet Commonly known as: PLAVIX Take 1 tablet (75 mg total) by mouth daily for 21 days. Start taking on: July 23, 2020 What changed: how much to take   fluticasone 50 MCG/ACT nasal spray Commonly known as: FLONASE PLACE 1 SPRAY INTO BOTH NOSTRILS 2 TIMES A DAY   furosemide 40 MG tablet Commonly known as: LASIX TAKE ONE TABLET BY MOUTH EVERY DAY   omeprazole 20 MG capsule Commonly known as: PRILOSEC TAKE ONE CAPSULE BY MOUTH EVERY DAY   potassium chloride SA 20 MEQ tablet Commonly known as: KLOR-CON Take 1 tablet (20 mEq total) by mouth once daily as needed (potassium).   promethazine 25 MG tablet Commonly known as: PHENERGAN TAKE ONE TABLET BY MOUTH EVERY 6 HOURS AS NEEDED FOR NAUSEA OR VOMITING        Discharge Instructions:  Diet Recommendation: Cardiac diet   Follow with Primary MD Ranae Plumber, PA in 7 days   Get CBC/BMP checked  in next visit within 1 week by PCP or SNF MD ( we routinely change or add medications that can affect your baseline labs and fluid status, therefore we recommend that you get the mentioned basic workup next visit with your PCP, your PCP may decide not to get them or add new tests based on their clinical decision)  On your next visit with your PCP, please Get Medicines reviewed and adjusted.  Please request your PCP  to go over all Hospital Tests and Procedure/Radiological results at the follow up, please get all Hospital records sent to your Prim MD by signing hospital release before you go home.  Activity: As tolerated with Full fall precautions use walker/cane & assistance as needed  For Heart  failure patients - Check your Weight same time everyday, if you gain over 2 pounds, or you develop in leg swelling, experience more shortness of breath or chest pain, call your Primary MD immediately. Follow Cardiac Low Salt Diet and 1.5 lit/day fluid restriction.  If you have smoked or chewed Tobacco in the last 2 yrs please stop smoking, stop any regular Alcohol  and or any Recreational drug use.  If you experience worsening of your admission symptoms, develop shortness of breath, life threatening emergency, suicidal or homicidal thoughts you must seek medical attention immediately by calling 911 or calling your MD immediately  if symptoms less severe.  You Must read complete instructions/literature along with all the possible adverse reactions/side effects for all the Medicines you take and that have been prescribed to you. Take any new Medicines after you have completely understood and accpet all the possible adverse reactions/side effects.   Do not drive, operate heavy machinery, perform activities at heights, swimming or participation in water activities or provide baby sitting services if your were admitted for syncope or siezures until you have seen by Primary MD or a Neurologist and advised to do so again.  Do not drive when taking Pain medications.  Do not take more than prescribed Pain, Sleep and Anxiety Medications  Wear Seat belts while driving.   Please note You were cared for by a hospitalist during your hospital stay. If you have any questions about your discharge medications or the care you received while you were in the hospital after you are discharged, you can call the unit and asked to speak with the hospitalist on call if the hospitalist that took care of you is not available. Once you are discharged, your primary care physician will handle any further medical issues. Please note that NO REFILLS for any discharge medications will be authorized once you are discharged, as it is  imperative that you return to your primary care physician (or establish a relationship with a primary care physician if you do not have one) for your aftercare needs so that they can reassess your need for medications and monitor your lab values.    Follow ups:    Follow-up Information     Ranae Plumber, [place] Follow up.   Specialty: Family Medicine Contact information: [place] [address] [phone]                 Wound care:     Discharge Exam:   Vitals:   07/22/20 1100 07/22/20 1330 07/22/20 1400 07/22/20 1430  BP: (!) 128/52 140/89 (!) 152/54 (!) 119/93  Pulse: (!) 54 64 (!) 59 73  Resp: '17 15 20 15  '$ Temp:      TempSrc:  SpO2: 100% 99% 100% 100%  Weight:      Height:        Body mass index is 29.1 kg/m.  General exam: Pleasant middle-aged Caucasian female.  Not in distress Skin: No rashes, lesions or ulcers. HEENT: Atraumatic, normocephalic, no obvious bleeding Lungs: Clear to auscultation bilaterally CVS: Regular rate and rhythm, no murmur GI/Abd soft, nontender, nondistended, bowel sound present CNS: Alert, awake, oriented x3 Psychiatry: Mood appropriate Extremities: No pedal edema, no calf tenderness  Time coordinating discharge: 35 minutes   The results of significant diagnostics from this hospitalization (including imaging, microbiology, ancillary and laboratory) are listed below for reference.    Procedures and Diagnostic Studies:   MR ANGIO HEAD WO CONTRAST  Result Date: 07/22/2020 CLINICAL DATA:  Vasculitis. CNS neuro deficit, acute stroke suspected. EXAM: MRA HEAD WITHOUT CONTRAST TECHNIQUE: Angiographic images of the Circle of Willis were acquired using MRA technique without intravenous contrast. COMPARISON:  No pertinent prior exam. FINDINGS: Motion limited evaluation. Anterior circulation: Bilateral intracranial ICAs, MCAs, and ACAs are patent. No evidence of hemodynamically significant stenosis of the ICAs or  MCAs. Limited evaluation of the ACAs and distal MCAs. Posterior circulation: The intradural vertebral arteries, basilar artery and proximal posterior cerebral arteries are patent. Limited evaluation of the posterior cerebral arteries due to motion. IMPRESSION: No evidence of a large vessel occlusion. Moderate to severe motion degradation which precludes adequate evaluation and accurate quantification of intracranial arterial stenoses, particularly of the distal vessels. A CTA or catheter arteriogram could better evaluate if clinically indicated. Electronically Signed   By: Margaretha Sheffield MD   On: 07/22/2020 11:43   MR BRAIN WO CONTRAST  Result Date: 07/22/2020 CLINICAL DATA:  TIA.  COVID positive.  Confusion and weakness. EXAM: MRI HEAD WITHOUT CONTRAST TECHNIQUE: Multiplanar, multiecho pulse sequences of the brain and surrounding structures were obtained without intravenous contrast. COMPARISON:  12/01/2019 FINDINGS: Brain: No acute infarction, hemorrhage, hydrocephalus, extra-axial collection or mass lesion. Remote left PCA territory infarct affecting the occipital lobe. Additional areas of small remote cortical infarction including at the bilateral frontal parietal junction. Mild chronic white matter disease. Vascular: Preserved flow voids Skull and upper cervical spine: Normal marrow signal Sinuses/Orbits: Mucosal thickening in the left sphenoid sinus. IMPRESSION: 1. No acute finding. 2. Remote cortical infarcts, most extensive in the left occipital lobe. Electronically Signed   By: Monte Fantasia M.D.   On: 07/22/2020 06:14   ECHOCARDIOGRAM COMPLETE BUBBLE STUDY  Result Date: 07/22/2020    ECHOCARDIOGRAM REPORT   Patient Name:   Gabrielle Schultz Date of Exam: 07/22/2020 Medical Rec #:  [id number]      Height:       61.0 in Accession #:    [id number]     Weight:       154.0 lb Date of Birth:  10/02/1970     BSA:          1.690 m Patient Age:    71 years       BP:           157/95 mmHg Patient Gender: F               HR:           56 bpm. Exam Location:  [facility] Procedure: 2D Echo, Color Doppler, Cardiac Doppler and Saline Contrast Bubble            Study Indications:     Stroke 434.91 / I63.9  History:  Patient has prior history of Echocardiogram examinations, most                  recent 12/03/2019. Previous Myocardial Infarction, Stroke; Risk                  Factors:Hypertension. CKD.  Sonographer:     Sherrie Sport RDCS (AE) Referring Phys:  [id number] Terrilee Croak Diagnosing Phys: Serafina Royals MD  Sonographer Comments: Technically challenging study due to limited acoustic windows. IMPRESSIONS  1. Left ventricular ejection fraction, by estimation, is 65 to 70%. The left ventricle has normal function. The left ventricle has no regional wall motion abnormalities. Left ventricular diastolic parameters were normal.  2. Right ventricular systolic function is normal. The right ventricular size is normal.  3. The mitral valve is normal in structure. Mild mitral valve regurgitation.  4. The aortic valve is normal in structure. Aortic valve regurgitation is mild.  5. Agitated saline contrast bubble study was negative, with no evidence of any interatrial shunt. FINDINGS  Left Ventricle: Left ventricular ejection fraction, by estimation, is 65 to 70%. The left ventricle has normal function. The left ventricle has no regional wall motion abnormalities. The left ventricular internal cavity size was normal in size. There is  no left ventricular hypertrophy. Left ventricular diastolic parameters were normal. Right Ventricle: The right ventricular size is normal. No increase in right ventricular wall thickness. Right ventricular systolic function is normal. Left Atrium: Left atrial size was normal in size. Right Atrium: Right atrial size was normal in size. Pericardium: There is no evidence of pericardial effusion. Mitral Valve: The mitral valve is normal in structure. Mild mitral valve regurgitation. Tricuspid Valve: The  tricuspid valve is normal in structure. Tricuspid valve regurgitation is trivial. Aortic Valve: The aortic valve is normal in structure. Aortic valve regurgitation is mild. Aortic valve mean gradient measures 4.0 mmHg. Aortic valve peak gradient measures 7.3 mmHg. Aortic valve area, by VTI measures 3.26 cm. Pulmonic Valve: The pulmonic valve was normal in structure. Pulmonic valve regurgitation is trivial. Aorta: The aortic root and ascending aorta are structurally normal, with no evidence of dilitation. IAS/Shunts: No atrial level shunt detected by color flow Doppler. Agitated saline contrast was given intravenously to evaluate for intracardiac shunting. Agitated saline contrast bubble study was negative, with no evidence of any interatrial shunt.  LEFT VENTRICLE PLAX 2D LVIDd:         4.20 cm  Diastology LVIDs:         2.90 cm  LV e' medial:    6.42 cm/s LV PW:         1.60 cm  LV E/e' medial:  8.0 LV IVS:        1.80 cm  LV e' lateral:   8.27 cm/s LVOT diam:     2.10 cm  LV E/e' lateral: 6.2 LV SV:         96 LV SV Index:   57 LVOT Area:     3.46 cm  LEFT ATRIUM           Index       RIGHT ATRIUM           Index LA diam:      4.10 cm 2.43 cm/m  RA Area:     23.10 cm LA Vol (A4C): 48.9 ml 28.93 ml/m RA Volume:   73.60 ml  43.55 ml/m  AORTIC VALVE  PULMONIC VALVE AV Area (Vmax):    3.05 cm    PV Vmax:        0.69 m/s AV Area (Vmean):   3.17 cm    PV Peak grad:   1.9 mmHg AV Area (VTI):     3.26 cm    RVOT Peak grad: 2 mmHg AV Vmax:           135.00 cm/s AV Vmean:          95.300 cm/s AV VTI:            0.294 m AV Peak Grad:      7.3 mmHg AV Mean Grad:      4.0 mmHg LVOT Vmax:         119.00 cm/s LVOT Vmean:        87.300 cm/s LVOT VTI:          0.277 m LVOT/AV VTI ratio: 0.94  AORTA Ao Root diam: 3.00 cm MITRAL VALVE MV Area (PHT): 6.54 cm    SHUNTS MV Decel Time: 116 msec    Systemic VTI:  0.28 m MV E velocity: 51.40 cm/s  Systemic Diam: 2.10 cm MV A velocity: 63.40 cm/s MV E/A ratio:   0.81 Serafina Royals MD Electronically signed by Serafina Royals MD Signature Date/Time: 07/22/2020/1:04:25 PM    Final      Labs:   Basic Metabolic Panel: Recent Labs  Lab 07/21/20 2256  NA 139  K 3.7  CL 109  CO2 25  GLUCOSE 97  BUN 20  CREATININE 1.71*  CALCIUM 8.8*   GFR Estimated Creatinine Clearance: 36.3 mL/min (A) (by C-G formula based on SCr of 1.71 mg/dL (H)). Liver Function Tests: Recent Labs  Lab 07/21/20 2256  AST 12*  ALT 8  ALKPHOS 130*  BILITOT 0.5  PROT 6.7  ALBUMIN 3.3*   No results for input(s): LIPASE, AMYLASE in the last 168 hours. No results for input(s): AMMONIA in the last 168 hours. Coagulation profile Recent Labs  Lab 07/21/20 2256  INR 1.0    CBC: Recent Labs  Lab 07/21/20 2256  WBC 7.0  NEUTROABS 3.9  HGB 12.6  HCT 37.5  MCV 100.3*  PLT 166   Cardiac Enzymes: No results for input(s): CKTOTAL, CKMB, CKMBINDEX, TROPONINI in the last 168 hours. BNP: Invalid input(s): POCBNP CBG: No results for input(s): GLUCAP in the last 168 hours. D-Dimer No results for input(s): DDIMER in the last 72 hours. Hgb A1c Recent Labs    07/22/20 0546  HGBA1C 5.9*   Lipid Profile Recent Labs    07/22/20 0546  CHOL 187  HDL 31*  LDLCALC 119*  TRIG 185*  CHOLHDL 6.0   Thyroid function studies No results for input(s): TSH, T4TOTAL, T3FREE, THYROIDAB in the last 72 hours.  Invalid input(s): FREET3 Anemia work up No results for input(s): VITAMINB12, FOLATE, FERRITIN, TIBC, IRON, RETICCTPCT in the last 72 hours. Microbiology Recent Results (from the past 240 hour(s))  Resp Panel by RT-PCR (Flu A&B, Covid) Nasopharyngeal Swab     Status: Abnormal   Collection Time: 07/21/20 11:07 PM   Specimen: Nasopharyngeal Swab; Nasopharyngeal(NP) swabs in vial transport medium  Result Value Ref Range Status   SARS Coronavirus 2 by RT PCR POSITIVE (A) NEGATIVE Final    Comment: RESULT CALLED TO, READ BACK BY AND VERIFIED WITH: ELIZABETH SHELL'@0010'$   07/22/20 RH (NOTE) SARS-CoV-2 target nucleic acids are DETECTED.  The SARS-CoV-2 RNA is generally detectable in upper respiratory specimens during the acute phase of  infection. Positive results are indicative of the presence of the identified virus, but do not rule out bacterial infection or co-infection with other pathogens not detected by the test. Clinical correlation with patient history and other diagnostic information is necessary to determine patient infection status. The expected result is Negative.  Fact Sheet for Patients: EntrepreneurPulse.com.au  Fact Sheet for Healthcare Providers: IncredibleEmployment.be  This test is not yet approved or cleared by the [place] FDA and  has been authorized for detection and/or diagnosis of SARS-CoV-2 by FDA under an Emergency Use Authorization (EUA).  This EUA will remain in effect (meaning this test can be u sed) for the duration of  the COVID-19 declaration under Section 564(b)(1) of the Act, 21 U.S.C. section 360bbb-3(b)(1), unless the authorization is terminated or revoked sooner.     Influenza A by PCR NEGATIVE NEGATIVE Final   Influenza B by PCR NEGATIVE NEGATIVE Final    Comment: (NOTE) The Xpert Xpress SARS-CoV-2/FLU/RSV plus assay is intended as an aid in the diagnosis of influenza from Nasopharyngeal swab specimens and should not be used as a sole basis for treatment. Nasal washings and aspirates are unacceptable for Xpert Xpress SARS-CoV-2/FLU/RSV testing.  Fact Sheet for Patients: EntrepreneurPulse.com.au  Fact Sheet for Healthcare Providers: IncredibleEmployment.be  This test is not yet approved or cleared by the [place] FDA and has been authorized for detection and/or diagnosis of SARS-CoV-2 by FDA under an Emergency Use Authorization (EUA). This EUA will remain in effect (meaning this test can be used) for the duration of  the COVID-19 declaration under Section 564(b)(1) of the Act, 21 U.S.C. section 360bbb-3(b)(1), unless the authorization is terminated or revoked.  Performed at [facility], [address] [place], [place] [address]      Signed: Terrilee Croak  [facility] 07/22/2020, 3:54 PM

## 2020-07-22 NOTE — ED Notes (Signed)
Pt transported to MRI via wheelchair with MRI tech and EDT

## 2020-07-22 NOTE — ED Provider Notes (Signed)
-----------------------------------------   12:07 AM on 07/22/2020 -----------------------------------------  CT head per Dr. Mckinley Jewel:  No acute intracranial abnormality.  Will perform swallow screen and if patient passes will administer aspirin + Plavix.  Case discussed with hospitalist services for admission.   Paulette Blanch, MD 07/22/20 [phone]

## 2020-07-22 NOTE — Progress Notes (Signed)
*  PRELIMINARY RESULTS* Echocardiogram 2D Echocardiogram has been performed.  Gabrielle Schultz 07/22/2020, 11:01 AM

## 2020-07-22 NOTE — Evaluation (Signed)
Occupational Therapy Evaluation Patient Details Name: Gabrielle Schultz MRN: [id number] DOB: 19-Aug-1970 Today's Date: 07/22/2020    History of Present Illness 50 y.o. Caucasian female with medical history significant for GERD, asthma, chronic kidney disease, CVA X3, gout, hypertension, dyslipidemia, presented to the emergency room with acute onset of right upper and lower extremity weakness that started before she came to the ER with associated expressive aphasia.  Pt with new TIA.   Clinical Impression   Upon entering the room, pt supine in bed and agreeable to OT intervention. Pt reports living at home with family who will be available to assist at discharge if needed. She has 1 step to enter single story home and she uses [facility] outside for safety. Her family provides supervision for her to transfer in/out of shower and family assists with care og her 40 y/o daughter as well. Pt demonstrates mobility and toilet transfer this session without any physical assist and no use of AD. Pt reports feeling at her baseline and all vitals WNLs. OT to SIGN OFF. Thank you for this referral.     Follow Up Recommendations  No OT follow up;Supervision - Intermittent    Equipment Recommendations  None recommended by OT       Precautions / Restrictions Precautions Precautions: Fall      Mobility Bed Mobility Overal bed mobility: Modified Independent                  Transfers Overall transfer level: Modified independent Equipment used: None                  Balance Overall balance assessment: Modified Independent                                         ADL either performed or assessed with clinical judgement   ADL Overall ADL's : Modified independent;At baseline                                             Vision Baseline Vision/History: Wears glasses Patient Visual Report: No change from baseline;Peripheral vision impairment               Pertinent Vitals/Pain Pain Assessment: 0-10 Pain Score: 0-No pain     Hand Dominance Right   Extremity/Trunk Assessment Upper Extremity Assessment Upper Extremity Assessment: Overall WFL for tasks assessed;RUE deficits/detail RUE Deficits / Details: Pt reports numbness in 1st-3rd digits.   Lower Extremity Assessment Lower Extremity Assessment: Overall WFL for tasks assessed   Cervical / Trunk Assessment Cervical / Trunk Assessment: Normal   Communication Communication Communication: No difficulties   Cognition Arousal/Alertness: Awake/alert Behavior During Therapy: WFL for tasks assessed/performed Overall Cognitive Status: Within Functional Limits for tasks assessed                                                Home Living Family/patient expects to be discharged to:: Private residence Living Arrangements: Spouse/significant other;Children Available Help at Discharge: Family;Available 24 hours/day Type of Home: House Home Access: Stairs to enter [employer] of Steps: 1 Entrance Stairs-Rails: None Home Layout: One level     Bathroom  Shower/Tub: Tub/shower unit;Curtain   Bathroom Toilet: Handicapped height     Home Equipment: Clinical cytogeneticist - 4 wheels;Cane - quad   Additional Comments: Pt reports living with spouse, adult son, his girlfriend, and pt's 97 y/o daughter. Pt reports son and girlfriend assist her with getting into shower and assist with her daughter at baseline.      Prior Functioning/Environment Level of Independence: Independent with assistive device(s)        Comments: use of [facility] outside. She reports assist in and out of shower and able to perform all other tasks independently                 OT Goals(Current goals can be found in the care plan section) Acute Rehab OT Goals Patient Stated Goal: to go home OT Goal Formulation: With patient Time For Goal Achievement: 07/22/20 Potential to Achieve Goals:  Good   AM-PAC OT "6 Clicks" Daily Activity     Outcome Measure Help from another person eating meals?: None Help from another person taking care of personal grooming?: None Help from another person toileting, which includes using toliet, bedpan, or urinal?: None Help from another person bathing (including washing, rinsing, drying)?: None Help from another person to put on and taking off regular upper body clothing?: None Help from another person to put on and taking off regular lower body clothing?: None 6 Click Score: 24   End of Session Nurse Communication: Mobility status  Activity Tolerance: Patient tolerated treatment well Patient left: in bed;with call bell/phone within reach  OT Visit Diagnosis: Unsteadiness on feet (R26.81)                Time: 1000-1025 OT Time Calculation (min): 25 min Charges:  OT General Charges $OT Visit: 1 Visit OT Evaluation $OT Eval Low Complexity: 1 Low OT Treatments $Self Care/Home Management : 8-22 mins Darleen Crocker, MS, OTR/L , CBIS ascom [phone]  07/22/20, 10:38 AM

## 2020-07-22 NOTE — ED Notes (Signed)
Katie RN aware of assigned bed

## 2020-07-22 NOTE — Progress Notes (Signed)
PT Cancellation Note  Patient Details Name: Gabrielle Schultz MRN: [id number] DOB: 1970/06/10   Cancelled Treatment:    Reason Eval/Treat Not Completed: Other (comment) Pt seen by OT who reports she is essentially at her baseline, up and walking w/o issue.  Pt has family assist/equipment/set up/etc that is appropriate and safe for return to home. OT discussed this with pt and she is in agreement that she does not need further OT (or PT) follow up.  Also confirmed with MD about no needs for PT post TIA with return to baseline.  Will sign off.   Kreg Shropshire, DPT 07/22/2020, 11:18 AM

## 2020-07-22 NOTE — Consult Note (Signed)
Neurology Consultation Reason for Consult: Transient right-sided weakness Referring Physician: Dahal, B  CC: Right-sided weakness  History is obtained from: Patient  HPI: Gabrielle Schultz is a 50 y.o. female with a history of previous strokes, MI who presents with transient right-sided weakness and difficulty speaking.  She states that it lasted for approximately 10 minutes.  She was just sitting down, and it came on all of a sudden.  By the time of arrival here yesterday, and a completely resolved.  She was admitted for further work-up.  Of note, she recently has been diagnosed with COVID, she has baseline cough due to asthma and has not noticed much difference.   ROS: A 14 point ROS was performed and is negative except as noted in the HPI.  Past Medical History:  Diagnosis Date   Acid reflux    Allergic rhinitis 06/26/2015   Allergy    Asthma    Carpal tunnel syndrome    right hand   Chronic kidney disease    Colitis    CVA (cerebral infarction)    2009, 2015   Gout    History of syncope    Hypertension 2008   Low HDL (under 40) 06/26/2015   MI (myocardial infarction) ([place])    2015 - patient wasnt told by cardiologist that she had MI but has had cardiac cath in past   Migraines    Poor circulation    Renal insufficiency    Stroke ([place])    Syncope and collapse      Family History  Problem Relation Age of Onset   Hyperlipidemia Mother    Heart disease Mother        has a defibrillator   COPD Mother    Hypertension Father    Heart disease Father    Hyperlipidemia Father    Hypertension Sister        died in her 55s from heart failure and end stage kidney disease   Kidney disease Sister    Hypertension Brother    Heart disease Brother        has a defibrillator   Coronary artery disease Brother        had CABG   Hyperlipidemia Brother    Healthy Child    Diabetes Child      Social History:  reports that she has been smoking cigarettes. She started smoking  about 3 years ago. She has a 16.50 pack-year smoking history. She has never used smokeless tobacco. She reports current alcohol use. She reports that she does not use drugs.   Exam: Current vital signs: BP (!) 119/93   Pulse 73   Temp 98.4 F (36.9 C) (Oral)   Resp 15   Ht '5\' 1"'$  (1.549 m)   Wt 69.9 kg   SpO2 100%   BMI 29.10 kg/m  Vital signs in last 24 hours: Temp:  [98.4 F (36.9 C)] 98.4 F (36.9 C) (07/18 2229) Pulse Rate:  [50-88] 73 (07/19 1430) Resp:  [13-22] 15 (07/19 1430) BP: (115-157)/(52-99) 119/93 (07/19 1430) SpO2:  [91 %-100 %] 100 % (07/19 1430) Weight:  [69.9 kg] 69.9 kg (07/19 0252)   Physical Exam  Constitutional: Appears well-developed and well-nourished.  Psych: Affect appropriate to situation Eyes: No scleral injection HENT: No OP obstruction MSK: no joint deformities.  Cardiovascular: Normal rate and regular rhythm.  Respiratory: Effort normal, non-labored breathing GI: Soft.  No distension. There is no tenderness.  Skin: WDI  Neuro: Mental Status: Patient is awake,  alert, oriented to person, place, month, year, and situation. Patient is able to give a clear and coherent history. No signs of aphasia or neglect Cranial Nerves: II: Visual Fields are full. Pupils are equal, round, and reactive to light.   III,IV, VI: EOMI without ptosis or diploplia.  V: Facial sensation is symmetric to temperature VII: Facial movement with mild right facial weakness VIII: hearing is intact to voice X: Uvula elevates symmetrically XI: Shoulder shrug is symmetric. XII: tongue is midline without atrophy or fasciculations.  Motor: Tone is normal. Bulk is normal. 5/5 strength was present in all four extremities.  Sensory: Sensation is symmetric to light touch and temperature in the arms and legs.Cerebellar: FNF without clear ataxia      I have reviewed labs in epic and the results pertinent to this consultation are: LDL 119 A1c 5.9   I have reviewed the  images obtained: MRI brain/MRA head-no acute findings  Echocardiogram-no embolic source Telemetry-normal sinus rhythm  Impression: 50 year old female with transient right-sided weakness and numbness.  Possibilities include recrudescence of her previous symptoms due to COVID, versus new true transient ischemic attack.  She has an elevated LDL compared to what I would typically like with secondary stroke prevention and therefore would favor increasing statin therapy.  Also, she is only taking Plavix QOD due to nosebleeds, I would favor 3 weeks of dual antiplatelet therapy with aspirin and Plavix followed by aspirin monotherapy.  Recommendations: 1) ASA 81 mg and Plavix 75 mg daily for 3 weeks followed by aspirin monotherapy 2) would favor increasing statin therapy given LDL above goal 3) carotid Dopplers, if she has significant left-sided carotid stenosis would need to get vascular surgery involved. 4) neurology will be available as needed   Roland Rack, MD [facility] [phone]  If 7pm- 7am, please page neurology on call as listed in [place].

## 2020-07-22 NOTE — ED Notes (Signed)
Pt requesting sedation medication for MRI anxiety. Lorazepam .'5mg'$  given as ordered for anxiety.

## 2020-07-24 ENCOUNTER — Other Ambulatory Visit: Payer: Self-pay

## 2020-08-11 ENCOUNTER — Ambulatory Visit: Payer: Medicaid Other | Admitting: Gastroenterology

## 2020-08-21 ENCOUNTER — Ambulatory Visit: Payer: Medicaid Other

## 2020-11-24 ENCOUNTER — Other Ambulatory Visit: Payer: Self-pay

## 2020-12-18 ENCOUNTER — Other Ambulatory Visit: Payer: Self-pay

## 2021-01-15 NOTE — Progress Notes (Signed)
NEUROLOGY FOLLOW UP OFFICE NOTE  Gabrielle Schultz [id number]  Assessment/Plan:   Left hemispheric transient ischemic attack vs recrudescence of previous stroke symptoms in setting of COVID History of recurrent strokes and TIAs Hypertension Hyperlipidemia Tobacco use disorder - less than 1 ppd.   1.Secondary stroke prevention as managed by PCP: - Plavix 75mg  daily - Statin therapy.  LDL goal less than 70 - Normotensive blood pressure - Hgb A1c goal less than 7 2. Smoking cessation 3. Mediterranean  4. Routine exercise 5. Follow up 6 months.  Subjective:  Gabrielle Schultz is a 51 year old right-handed female with HTN, CKD, COPD, tobacco use disorder, ischemic colitis and history of strokes who follows up for stroke. CT/MRI brain and MRA head from July personally reviewed.   UPDATE: Current medications:  Plavix 75mg , clonidine, amlodipine, clonidine, atorvastatin 40mg  daily    On 07/21/2020, she developed sudden onset right sided weakness and aphasia lasting 20 minutes.  She went to the ED where CT and MRI of brain showed chronic cortical infarcts, primarily left occipital lobe, but no acute stroke.  MRA of head limited due to motion but no large vessel occlusion. Carotid ultrasound negative for hemodynamically significant stenosis.  Echocardiogram showed EF 65-70% with negative bubble study.  LDL was 119 and Hgb A1c was 5.9.  She was found to be positive for COVID.  Discharged on ASA and Plavix for 3 weeks followed by Plavix alone.  Simvastatin was changed to atorvastatin 40mg    HISTORY: She had a stroke at age 62.  She doesn't remember her symptoms.   She had a second stroke in addition to an acute ischemic colitis in 2014.  She does not remember her symptoms but records report right homonymous hemianopsia and right arm and leg weakness and numbness of 2 months duration.   MRI of brain without contrast from 12/29/2012 demonstrated "[R]emote moderate size left occipital/posterior  inferior left temporal lobe infarct with small area of involvement of the inferior left parietal lobe.  Associated encephalomalacia and laminar necrosis.  No evidence of acute infarct.  Remote small left frontal lobe infarct.  Mild to moderate nonspecific white matter type changes.Marland KitchenMarland KitchenGlobal atrophy without hydrocephalus."  She reports that no cause for her strokes were ever found.  Following her stroke, she never underwent PT/OT because she had no insurance.  Since the second stroke, she continues to have aching and cramping of her hand down to her elbow with use of her hand.  She also has numbness involving the first 3 digits of her right hand.  She has history of carpal tunnel syndrome diagnosed many years ago, so she tried wearing a brace.  NCV-EMG of right upper extremity on 11/23/2018 was normal.   She was admitted to [facility] November 2021 for a third stroke presenting with sudden left sided weakness.  Blood pressure was in the 409W systolic.  CT head was negative for bleed.  She received IV tPA.  MRI of brain showed small acute posterior right frontal lobe infarct.  2D echo showed EF 65-70% with no source of embolus.  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  Blood cultures were negative.  [employer] Virus 2 negative, LDL 97, and Hgb A1c 5.7.  Hypercoagulable panel showed homocysteine 52 (thought to be secondary to smoking), antithrombin III 121, protein C 109, B2GPI negative, lupus anticoagulant panel negative and  cardiolipin negative.  She was discharged on ASA and Plavix 75mg  daily for 3 weeks followed by Plavix alone.  Zocor was increased to 40mg  daily, however her PCP reduced dose due to her kidney disease.    PAST MEDICAL HISTORY: Past Medical History:  Diagnosis Date   Acid reflux    Allergic rhinitis 06/26/2015   Allergy    Asthma    Carpal tunnel  syndrome    right hand   Chronic kidney disease    Colitis    CVA (cerebral infarction)    2009, 2015   Gout    History of syncope    Hypertension 2008   Low HDL (under 40) 06/26/2015   MI (myocardial infarction) ([place])    2015 - patient wasnt told by cardiologist that she had MI but has had cardiac cath in past   Migraines    Poor circulation    Renal insufficiency    Stroke [facility])    Syncope and collapse     MEDICATIONS: Current Outpatient Medications on File Prior to Visit  Medication Sig Dispense Refill   acetaminophen (TYLENOL) 500 MG tablet Take 500 mg by mouth every 6 (six) hours as needed for fever or mild pain.     albuterol (VENTOLIN HFA) 108 (90 Base) MCG/ACT inhaler Inhale 2 puffs into the lungs every 4 (four) hours as needed for wheezing (cough). 6.7 g 2   allopurinol (ZYLOPRIM) 100 MG tablet TAKE ONE TABLET BY MOUTH EVERY DAY 30 tablet 11   amLODipine (NORVASC) 10 MG tablet TAKE ONE TABLET BY MOUTH EVERY DAY 90 tablet 0   aspirin EC 81 MG EC tablet Take 1 tablet (81 mg total) by mouth daily. Swallow whole. 90 tablet 0   atorvastatin (LIPITOR) 40 MG tablet Take 1 tablet (40 mg total) by mouth daily. 90 tablet 0   cetirizine (ZYRTEC) 10 MG tablet TAKE ONE TABLET BY MOUTH EVERY DAY 90 tablet 1   cloNIDine (CATAPRES) 0.3 MG tablet Take 1 tablet (0.3 mg total) by mouth in the morning and 1 tablet (0.3 mg total) at noon and 1 tablet (0.3 mg total) in the evening. 90 tablet 11   fluticasone (FLONASE) 50 MCG/ACT nasal spray PLACE 1 SPRAY INTO BOTH NOSTRILS 2 TIMES A DAY 16 g 0   fluticasone-salmeterol (ADVAIR DISKUS) 250-50 MCG/ACT AEPB Inhale 1 puff into the lungs in the morning and at bedtime. 60 each 2   furosemide (LASIX) 40 MG tablet TAKE ONE TABLET BY MOUTH EVERY DAY 90 tablet 0   omeprazole (PRILOSEC) 20 MG capsule TAKE ONE CAPSULE BY MOUTH EVERY DAY 90 capsule 0   potassium chloride SA (KLOR-CON) 20 MEQ tablet Take 1 tablet (20 mEq total) by mouth once daily as needed  (potassium). 45 tablet 0   promethazine (PHENERGAN) 25 MG tablet TAKE ONE TABLET BY MOUTH EVERY 6 HOURS AS NEEDED FOR NAUSEA OR VOMITING 15 tablet 0   [DISCONTINUED] simvastatin (ZOCOR) 10 MG tablet TAKE ONE TABLET BY MOUTH EVERY DAY (Patient not taking: Reported on 07/22/2020) 90 tablet 0   No current facility-administered medications on file prior to visit.    ALLERGIES: Allergies  Allergen Reactions   Coconut Oil Hives and Swelling    Just coconut ("all coconut")   Amoxicillin Other (See Comments)    "makes me sick" "swelled up everywhere" Has patient had a PCN reaction causing immediate rash, facial/tongue/throat swelling, SOB or lightheadedness with hypotension: No Has patient had a PCN reaction causing severe rash involving mucus membranes or  skin necrosis: No Has patient had a PCN reaction that required hospitalization: No Has patient had a PCN reaction occurring within the last 10 years: No If all of the above answers are "NO", then may proceed with Cephalosporin use.     Penicillins Other (See Comments)    "makes me sick" "swelled up oil" Has patient had a PCN reaction causing immediate rash, facial/tongue/throat swelling, SOB or lightheadedness with hypotension: No Has patient had a PCN reaction causing severe rash involving mucus membranes or skin necrosis: No Has patient had a PCN reaction that required hospitalization: No Has patient had a PCN reaction occurring within the last 10 years: Yes If all of the above answers are "NO", then may proceed with Cephalosporin use.    Singulair [Montelukast Sodium] Rash    "made heart race"    FAMILY HISTORY: Family History  Problem Relation Age of Onset   Hyperlipidemia Mother    Heart disease Mother        has a defibrillator   COPD Mother    Hypertension Father    Heart disease Father    Hyperlipidemia Father    Hypertension Sister        died in her 72s from heart failure and end stage kidney disease   Kidney disease  Sister    Hypertension Brother    Heart disease Brother        has a defibrillator   Coronary artery disease Brother        had CABG   Hyperlipidemia Brother    Healthy Child    Diabetes Child       Objective:  Blood pressure (!) 150/72, pulse 91, height 5\' 1"  (1.549 m), weight 159 lb 6.4 oz (72.3 kg), SpO2 100 %. General: No acute distress.  Patient appears well-groomed.   Head:  Normocephalic/atraumatic Eyes:  Fundi examined but not visualized Neck: supple, no paraspinal tenderness, full range of motion Heart:  Regular rate and rhythm Lungs:  Clear to auscultation bilaterally Back: No paraspinal tenderness Neurological Exam: alert and oriented to person, place, and time.  Speech fluent and not dysarthric, language intact.  CN II-XII intact. Bulk and tone normal, muscle strength 5/5 throughout.  Sensation to light touch intact.  Deep tendon reflexes 2+ throughout, toes downgoing.  Finger to nose testing intact.  Gait normal, Romberg negative.   Metta Clines, DO  CC: Ranae Plumber, [place]

## 2021-01-16 ENCOUNTER — Ambulatory Visit (INDEPENDENT_AMBULATORY_CARE_PROVIDER_SITE_OTHER): Payer: Self-pay | Admitting: Neurology

## 2021-01-16 ENCOUNTER — Other Ambulatory Visit: Payer: Self-pay

## 2021-01-16 ENCOUNTER — Encounter: Payer: Self-pay | Admitting: Neurology

## 2021-01-16 VITALS — BP 150/72 | HR 91 | Ht 61.0 in | Wt 159.4 lb

## 2021-01-16 DIAGNOSIS — I10 Essential (primary) hypertension: Secondary | ICD-10-CM

## 2021-01-16 DIAGNOSIS — E785 Hyperlipidemia, unspecified: Secondary | ICD-10-CM

## 2021-01-16 DIAGNOSIS — G459 Transient cerebral ischemic attack, unspecified: Secondary | ICD-10-CM

## 2021-01-16 NOTE — Patient Instructions (Addendum)
Plavix 75mg  daily Atorvastatin.  Blood pressure control

## 2021-02-11 ENCOUNTER — Other Ambulatory Visit: Payer: Self-pay

## 2021-04-02 ENCOUNTER — Telehealth: Payer: Self-pay | Admitting: *Deleted

## 2021-04-02 NOTE — Telephone Encounter (Signed)
I called patient to tell her about the new research study [employer]. I talked to the patient who will look over the consent and let us know if she is interested in the study. I will e-mail the consent to the patient. ?

## 2021-05-21 ENCOUNTER — Ambulatory Visit: Payer: Medicaid Other | Admitting: Gerontology

## 2021-05-21 VITALS — BP 146/75 | HR 71 | Temp 98.0°F | Resp 16 | Ht 61.0 in | Wt 161.0 lb

## 2021-05-21 DIAGNOSIS — M109 Gout, unspecified: Secondary | ICD-10-CM

## 2021-05-21 DIAGNOSIS — Z889 Allergy status to unspecified drugs, medicaments and biological substances status: Secondary | ICD-10-CM

## 2021-05-21 DIAGNOSIS — R110 Nausea: Secondary | ICD-10-CM

## 2021-05-21 DIAGNOSIS — J3089 Other allergic rhinitis: Secondary | ICD-10-CM

## 2021-05-21 DIAGNOSIS — I10 Essential (primary) hypertension: Secondary | ICD-10-CM

## 2021-05-21 DIAGNOSIS — Z0000 Encounter for general adult medical examination without abnormal findings: Secondary | ICD-10-CM

## 2021-05-21 DIAGNOSIS — J4541 Moderate persistent asthma with (acute) exacerbation: Secondary | ICD-10-CM

## 2021-05-21 DIAGNOSIS — K219 Gastro-esophageal reflux disease without esophagitis: Secondary | ICD-10-CM

## 2021-05-21 DIAGNOSIS — E782 Mixed hyperlipidemia: Secondary | ICD-10-CM

## 2021-05-21 DIAGNOSIS — M542 Cervicalgia: Secondary | ICD-10-CM | POA: Insufficient documentation

## 2021-05-21 MED ORDER — CLONIDINE HCL 0.3 MG PO TABS
0.3000 mg | ORAL_TABLET | Freq: Three times a day (TID) | ORAL | 11 refills | Status: DC
  Filled 2021-05-21 – 2021-05-29 (×2): qty 90, 30d supply, fill #0
  Filled 2021-08-10: qty 90, 30d supply, fill #1
  Filled 2021-09-01: qty 90, 30d supply, fill #2
  Filled 2021-10-05: qty 90, 30d supply, fill #3
  Filled 2021-11-02: qty 90, 30d supply, fill #4
  Filled 2021-11-30: qty 90, 30d supply, fill #5
  Filled 2021-12-27 – 2021-12-29 (×2): qty 90, 30d supply, fill #6
  Filled 2022-01-01: qty 12, 4d supply, fill #6
  Filled 2022-01-01: qty 78, 26d supply, fill #6

## 2021-05-21 MED ORDER — OMEPRAZOLE 20 MG PO CPDR
DELAYED_RELEASE_CAPSULE | Freq: Every day | ORAL | 0 refills | Status: DC
  Filled 2021-05-21: qty 90, fill #0
  Filled 2021-05-29: qty 90, 90d supply, fill #0

## 2021-05-21 MED ORDER — FLUTICASONE-SALMETEROL 250-50 MCG/ACT IN AEPB
1.0000 | INHALATION_SPRAY | Freq: Two times a day (BID) | RESPIRATORY_TRACT | 2 refills | Status: DC
  Filled 2021-05-21 – 2021-05-29 (×2): qty 60, 30d supply, fill #0
  Filled 2021-10-05 – 2021-10-08 (×2): qty 60, 30d supply, fill #1

## 2021-05-21 MED ORDER — ALBUTEROL SULFATE HFA 108 (90 BASE) MCG/ACT IN AERS
2.0000 | INHALATION_SPRAY | RESPIRATORY_TRACT | 2 refills | Status: DC | PRN
  Filled 2021-05-21 – 2021-05-29 (×2): qty 6.7, 17d supply, fill #0
  Filled 2021-09-01: qty 6.7, 17d supply, fill #1
  Filled 2021-10-05: qty 6.7, 17d supply, fill #2

## 2021-05-21 MED ORDER — ALLOPURINOL 100 MG PO TABS
ORAL_TABLET | Freq: Every day | ORAL | 11 refills | Status: DC
  Filled 2021-05-21: qty 30, fill #0
  Filled 2021-05-22: qty 30, 30d supply, fill #0
  Filled 2021-05-29: qty 90, 90d supply, fill #0
  Filled 2021-09-01: qty 90, 90d supply, fill #1
  Filled 2021-10-05: qty 30, 30d supply, fill #2
  Filled 2021-11-02 – 2021-11-30 (×3): qty 90, 90d supply, fill #2

## 2021-05-21 MED ORDER — PROMETHAZINE HCL 25 MG PO TABS
ORAL_TABLET | ORAL | 0 refills | Status: DC
  Filled 2021-05-21: qty 15, fill #0
  Filled 2021-05-29: qty 15, 3d supply, fill #0

## 2021-05-21 MED ORDER — CLOPIDOGREL BISULFATE 75 MG PO TABS
75.0000 mg | ORAL_TABLET | Freq: Every day | ORAL | 1 refills | Status: DC
  Filled 2021-05-21 – 2021-05-29 (×2): qty 90, 90d supply, fill #0
  Filled 2021-09-01: qty 90, 90d supply, fill #1

## 2021-05-21 MED ORDER — ASPIRIN 81 MG PO TBEC
81.0000 mg | DELAYED_RELEASE_TABLET | Freq: Every day | ORAL | 0 refills | Status: DC
  Filled 2021-05-21 – 2021-05-29 (×2): qty 90, 90d supply, fill #0

## 2021-05-21 MED ORDER — CETIRIZINE HCL 10 MG PO TABS
ORAL_TABLET | Freq: Every day | ORAL | 1 refills | Status: DC
  Filled 2021-05-21: qty 90, fill #0
  Filled 2021-05-29: qty 90, 90d supply, fill #0
  Filled 2021-09-01: qty 90, 90d supply, fill #1

## 2021-05-21 MED ORDER — LOSARTAN POTASSIUM 25 MG PO TABS
25.0000 mg | ORAL_TABLET | Freq: Every day | ORAL | 3 refills | Status: DC
  Filled 2021-05-21: qty 30, 30d supply, fill #0
  Filled 2021-05-29: qty 90, 90d supply, fill #0
  Filled 2021-09-01: qty 30, 30d supply, fill #1

## 2021-05-21 MED ORDER — LIDOCAINE 5 % EX OINT
1.0000 "application " | TOPICAL_OINTMENT | CUTANEOUS | 0 refills | Status: DC | PRN
  Filled 2021-05-21: qty 35.44, fill #0
  Filled 2021-05-29: qty 35.44, 30d supply, fill #0

## 2021-05-21 MED ORDER — FLUTICASONE PROPIONATE 50 MCG/ACT NA SUSP
NASAL | 0 refills | Status: DC
  Filled 2021-05-21: qty 16, fill #0
  Filled 2021-05-29: qty 16, 30d supply, fill #0

## 2021-05-21 MED ORDER — FUROSEMIDE 40 MG PO TABS
ORAL_TABLET | Freq: Every day | ORAL | 0 refills | Status: DC
  Filled 2021-05-21: qty 90, fill #0
  Filled 2021-05-29: qty 90, 90d supply, fill #0

## 2021-05-21 MED ORDER — AMLODIPINE BESYLATE 10 MG PO TABS
ORAL_TABLET | Freq: Every day | ORAL | 0 refills | Status: DC
  Filled 2021-05-21: qty 90, fill #0
  Filled 2021-05-29: qty 90, 90d supply, fill #0

## 2021-05-21 MED ORDER — ATORVASTATIN CALCIUM 40 MG PO TABS
40.0000 mg | ORAL_TABLET | Freq: Every day | ORAL | 0 refills | Status: DC
  Filled 2021-05-21 – 2021-05-29 (×2): qty 90, 90d supply, fill #0

## 2021-05-21 NOTE — Patient Instructions (Signed)

## 2021-05-21 NOTE — Progress Notes (Signed)
Established Patient Office Visit  Subjective   Patient ID: Gabrielle Schultz, female    DOB: 1970/12/14  Age: 51 y.o. MRN: [id number]  Chief Complaint  Patient presents with   Follow-up    HPI Gabrielle Schultz is a 51 y.o. female who  has a past medical history of Acid reflux, Allergic rhinitis (06/26/2015), Allergy, Asthma, Carpal tunnel syndrome, Chronic kidney disease, Colitis, CVA (cerebral infarction), Gout, History of syncope, Hypertension (2008), Low HDL (under 40) (06/26/2015), MI (myocardial infarction) ([place]), Migraines, Poor circulation, Renal insufficiency, Stroke ([place]), and Syncope , who presents for follow up visit and medication refill. She no showed to her follow up visit. She followed up with Nephrologist Dr Anthonette Legato at [facility] on 02/09/21. He recommended her to continue on her current medication and follow up on 06/09/21. Currently, she c/o non traumatic intermittent pain to posterior aspect of her neck that started 3 weeks ago. She states that turning aggravates symptoms, applies anelgesic with moderate relief, intensity increases to 8/10 and subsides with taking tylenol after 4 hrs. She denies muscle nor motor weakness. Overall, she states that she's doing well and offers no further complaint.  Review of Systems  Constitutional: Negative.   HENT: Negative.    Eyes: Negative.   Respiratory: Negative.    Cardiovascular: Negative.   Gastrointestinal: Negative.   Genitourinary: Negative.   Musculoskeletal:  Positive for neck pain.  Skin: Negative.   Neurological: Negative.   Endo/Heme/Allergies: Negative.   Psychiatric/Behavioral: Negative.       Objective:     BP (!) 146/75 (BP Location: Left Arm, Patient Position: Sitting, Cuff Size: Small)   Pulse 71   Temp 98 F (36.7 C) (Oral)   Resp 16   Ht $R'5\' 1"'Kp$  (1.549 m)   Wt 161 lb (73 kg)   SpO2 96%   BMI 30.42 kg/m  BP Readings from Last 3 Encounters:  05/21/21 (!) 146/75  01/16/21 (!)  150/72  07/22/20 (!) 149/65   Wt Readings from Last 3 Encounters:  05/21/21 161 lb (73 kg)  01/16/21 159 lb 6.4 oz (72.3 kg)  07/21/20 154 lb (69.9 kg)      Physical Exam HENT:     Head: Normocephalic and atraumatic.     Mouth/Throat:     Mouth: Mucous membranes are moist.  Eyes:     Extraocular Movements: Extraocular movements intact.     Conjunctiva/sclera: Conjunctivae normal.     Pupils: Pupils are equal, round, and reactive to light.  Cardiovascular:     Rate and Rhythm: Normal rate and regular rhythm.     Pulses: Normal pulses.     Heart sounds: Normal heart sounds.  Pulmonary:     Effort: Pulmonary effort is normal.     Breath sounds: Normal breath sounds.  Musculoskeletal:     Cervical back: Normal range of motion.  Skin:    General: Skin is warm.  Neurological:     General: No focal deficit present.     Mental Status: She is alert and oriented to person, place, and time. Mental status is at baseline.  Psychiatric:        Mood and Affect: Mood normal.        Behavior: Behavior normal.        Thought Content: Thought content normal.        Judgment: Judgment normal.     Results for orders placed or performed in visit on 05/21/21  Comp Met (CMET)  Result  Value Ref Range   Glucose 109 (H) 70 - 99 mg/dL   BUN 23 6 - 24 mg/dL   Creatinine, Ser 4.58 (H) 0.57 - 1.00 mg/dL   eGFR 34 (L) >41 SQ/ATH/9.94   BUN/Creatinine Ratio 13 9 - 23   Sodium 141 134 - 144 mmol/L   Potassium 4.3 3.5 - 5.2 mmol/L   Chloride 106 96 - 106 mmol/L   CO2 22 20 - 29 mmol/L   Calcium 8.9 8.7 - 10.2 mg/dL   Total Protein 6.7 6.0 - 8.5 g/dL   Albumin 4.1 3.8 - 4.8 g/dL   Globulin, Total 2.6 1.5 - 4.5 g/dL   Albumin/Globulin Ratio 1.6 1.2 - 2.2   Bilirubin Total 0.2 0.0 - 1.2 mg/dL   Alkaline Phosphatase 157 (H) 44 - 121 IU/L   AST 10 0 - 40 IU/L   ALT 10 0 - 32 IU/L  CBC w/Diff  Result Value Ref Range   WBC 8.9 3.4 - 10.8 x10E3/uL   RBC 3.72 (L) 3.77 - 5.28 x10E6/uL    Hemoglobin 12.3 11.1 - 15.9 g/dL   Hematocrit 78.2 97.6 - 46.6 %   MCV 96 79 - 97 fL   MCH 33.1 (H) 26.6 - 33.0 pg   MCHC 34.6 31.5 - 35.7 g/dL   RDW 50.2 28.6 - 14.3 %   Platelets 242 150 - 450 x10E3/uL   Neutrophils 61 Not Estab. %   Lymphs 28 Not Estab. %   Monocytes 6 Not Estab. %   Eos 4 Not Estab. %   Basos 1 Not Estab. %   Neutrophils Absolute 5.4 1.4 - 7.0 x10E3/uL   Lymphocytes Absolute 2.5 0.7 - 3.1 x10E3/uL   Monocytes Absolute 0.5 0.1 - 0.9 x10E3/uL   EOS (ABSOLUTE) 0.4 0.0 - 0.4 x10E3/uL   Basophils Absolute 0.1 0.0 - 0.2 x10E3/uL   Immature Granulocytes 0 Not Estab. %   Immature Grans (Abs) 0.0 0.0 - 0.1 x10E3/uL  HgB A1c  Result Value Ref Range   Hgb A1c MFr Bld 5.7 (H) 4.8 - 5.6 %   Est. average glucose Bld gHb Est-mCnc 117 mg/dL    Last CBC Lab Results  Component Value Date   WBC 8.9 05/21/2021   HGB 12.3 05/21/2021   HCT 35.6 05/21/2021   MCV 96 05/21/2021   MCH 33.1 (H) 05/21/2021   RDW 15.4 05/21/2021   PLT 242 05/21/2021   Last metabolic panel Lab Results  Component Value Date   GLUCOSE 109 (H) 05/21/2021   NA 141 05/21/2021   K 4.3 05/21/2021   CL 106 05/21/2021   CO2 22 05/21/2021   BUN 23 05/21/2021   CREATININE 1.83 (H) 05/21/2021   GFRNONAA 37 (L) 07/21/2020   CALCIUM 8.9 05/21/2021   PHOS 3.7 07/31/2015   PROT 6.7 05/21/2021   ALBUMIN 4.1 05/21/2021   LABGLOB 2.6 05/21/2021   AGRATIO 1.6 05/21/2021   BILITOT 0.2 05/21/2021   ALKPHOS 157 (H) 05/21/2021   AST 10 05/21/2021   ALT 10 05/21/2021   ANIONGAP 5 07/21/2020   Last lipids Lab Results  Component Value Date   CHOL 187 07/22/2020   HDL 31 (L) 07/22/2020   LDLCALC 119 (H) 07/22/2020   TRIG 185 (H) 07/22/2020   CHOLHDL 6.0 07/22/2020   Last hemoglobin A1c Lab Results  Component Value Date   HGBA1C 5.7 (H) 05/21/2021   Last thyroid functions Lab Results  Component Value Date   TSH 1.750 12/15/2017   T4TOTAL 8.1 03/16/2016  The ASCVD Risk score (Arnett DK,  et al., 2019) failed to calculate for the following reasons:   The patient has a prior MI or stroke diagnosis    Assessment & Plan:   1. Health care maintenance - Routine labs will be checked. - Comp Met (CMET) - CBC w/Diff - HgB A1c  2. Gout, unspecified cause, unspecified chronicity, unspecified site - Her gout is under control, will continue current medication. - allopurinol (ZYLOPRIM) 100 MG tablet; TAKE ONE TABLET BY MOUTH ONCE DAILY.  Dispense: 30 tablet; Refill: 11  3. Primary hypertension - Her blood pressure is not under control, her goal should be less than 130/80. She will continue current medication, DASH diet and exercise as tolerated. - amLODipine (NORVASC) 10 MG tablet; TAKE ONE TABLET BY MOUTH ONCE DAILY.  Dispense: 90 tablet; Refill: 0 - aspirin EC 81 MG tablet; Take 1 tablet (81 mg total) by mouth once daily. Swallow whole.  Dispense: 90 tablet; Refill: 0 - cloNIDine (CATAPRES) 0.3 MG tablet; Take 1 tablet by mouth 3 times daily (morning, noon, and evening).  Dispense: 90 tablet; Refill: 11 - losartan (COZAAR) 25 MG tablet; Take 1 tablet (25 mg total) by mouth once daily.  Dispense: 30 tablet; Refill: 3 - furosemide (LASIX) 40 MG tablet; TAKE ONE TABLET BY MOUTH ONCE DAILY.  Dispense: 90 tablet; Refill: 0  4. Allergic rhinitis due to other allergic trigger, unspecified seasonality - She will continue current medication. - fluticasone (FLONASE) 50 MCG/ACT nasal spray; SPRAY 1 SPRAY INTO BOTH NOSTRILS 2 TIMES A DAY  Dispense: 16 g; Refill: 0 - cetirizine (ZYRTEC) 10 MG tablet; TAKE ONE TABLET BY MOUTH ONCE DAILY.  Dispense: 90 tablet; Refill: 1   5. Moderate persistent asthma with acute exacerbation - Her breathing is stable, she will continue current medication - fluticasone-salmeterol (ADVAIR DISKUS) 250-50 MCG/ACT AEPB; Inhale 1 puff into the lungs in the morning and at bedtime.  Dispense: 60 each; Refill: 2 - albuterol (PROVENTIL HFA) 108 (90 Base) MCG/ACT  inhaler; Inhale 2 puffs into the lungs once every 4 (four) hours as needed for wheezing (cough).  Dispense: 6.7 g; Refill: 2   6 Gastroesophageal reflux disease without esophagitis - Her acid reflux is under control, will continue on current medication. -Avoid spicy, fatty and fried food -Avoid sodas and sour juices -Avoid heavy meals -Avoid eating 4 hours before bedtim -Elevate head of bed at night - omeprazole (PRILOSEC) 20 MG capsule; TAKE ONE CAPSULE BY MOUTH ONCE DAILY.  Dispense: 90 capsule; Refill: 0  7. Nausea -Her nausea is under control, will continue current medication as needed. - promethazine (PHENERGAN) 25 MG tablet; TAKE 1 TABLET BY MOUTH ONCE EVERY 6 HOURS AS NEEDED FOR NAUSEA OR VOMITING.  Dispense: 15 tablet; Refill: 0  8. Mixed hyperlipidemia - She will continue current medication, advised to go to the ED for hematuria, hematochezia and active bleeding. - clopidogrel (PLAVIX) 75 MG tablet; Take 1 tablet (75 mg total) by mouth once daily.  Dispense: 90 tablet; Refill: 1  9- Neck pain - Likely muscle sprain, will apply Lidocaine patch, advised to go to the ED for worsening symptoms. - lidocaine (XYLOCAINE) 5 % ointment; Apply 1 application to the affected area(s) topically once daily as needed.  Dispense: 35.44 g; Refill: 0    Return in about 13 weeks (around 08/20/2021), or if symptoms worsen or fail to improve.    Charles Niese Jerold Coombe, NP

## 2021-05-22 ENCOUNTER — Other Ambulatory Visit: Payer: Self-pay

## 2021-05-22 LAB — CBC WITH DIFFERENTIAL/PLATELET
Basophils Absolute: 0.1 10*3/uL (ref 0.0–0.2)
Basos: 1 %
EOS (ABSOLUTE): 0.4 10*3/uL (ref 0.0–0.4)
Eos: 4 %
Hematocrit: 35.6 % (ref 34.0–46.6)
Hemoglobin: 12.3 g/dL (ref 11.1–15.9)
Immature Grans (Abs): 0 10*3/uL (ref 0.0–0.1)
Immature Granulocytes: 0 %
Lymphocytes Absolute: 2.5 10*3/uL (ref 0.7–3.1)
Lymphs: 28 %
MCH: 33.1 pg — ABNORMAL HIGH (ref 26.6–33.0)
MCHC: 34.6 g/dL (ref 31.5–35.7)
MCV: 96 fL (ref 79–97)
Monocytes Absolute: 0.5 10*3/uL (ref 0.1–0.9)
Monocytes: 6 %
Neutrophils Absolute: 5.4 10*3/uL (ref 1.4–7.0)
Neutrophils: 61 %
Platelets: 242 10*3/uL (ref 150–450)
RBC: 3.72 x10E6/uL — ABNORMAL LOW (ref 3.77–5.28)
RDW: 15.4 % (ref 11.7–15.4)
WBC: 8.9 10*3/uL (ref 3.4–10.8)

## 2021-05-22 LAB — COMPREHENSIVE METABOLIC PANEL
ALT: 10 IU/L (ref 0–32)
AST: 10 IU/L (ref 0–40)
Albumin/Globulin Ratio: 1.6 (ref 1.2–2.2)
Albumin: 4.1 g/dL (ref 3.8–4.8)
Alkaline Phosphatase: 157 IU/L — ABNORMAL HIGH (ref 44–121)
BUN/Creatinine Ratio: 13 (ref 9–23)
BUN: 23 mg/dL (ref 6–24)
Bilirubin Total: 0.2 mg/dL (ref 0.0–1.2)
CO2: 22 mmol/L (ref 20–29)
Calcium: 8.9 mg/dL (ref 8.7–10.2)
Chloride: 106 mmol/L (ref 96–106)
Creatinine, Ser: 1.83 mg/dL — ABNORMAL HIGH (ref 0.57–1.00)
Globulin, Total: 2.6 g/dL (ref 1.5–4.5)
Glucose: 109 mg/dL — ABNORMAL HIGH (ref 70–99)
Potassium: 4.3 mmol/L (ref 3.5–5.2)
Sodium: 141 mmol/L (ref 134–144)
Total Protein: 6.7 g/dL (ref 6.0–8.5)
eGFR: 34 mL/min/{1.73_m2} — ABNORMAL LOW (ref >59)

## 2021-05-22 LAB — HEMOGLOBIN A1C
Est. average glucose Bld gHb Est-mCnc: 117 mg/dL
Hgb A1c MFr Bld: 5.7 % — ABNORMAL HIGH (ref 4.8–5.6)

## 2021-05-27 DIAGNOSIS — Z006 Encounter for examination for normal comparison and control in clinical research program: Secondary | ICD-10-CM

## 2021-05-27 NOTE — Research (Signed)
Called patient to follow up to see if interested in participating in the [employer] trial. Not interested at this time.

## 2021-05-29 ENCOUNTER — Other Ambulatory Visit: Payer: Self-pay

## 2021-08-05 ENCOUNTER — Ambulatory Visit: Payer: Self-pay | Admitting: Neurology

## 2021-08-10 ENCOUNTER — Other Ambulatory Visit: Payer: Self-pay

## 2021-08-10 ENCOUNTER — Other Ambulatory Visit: Payer: Self-pay | Admitting: Gerontology

## 2021-08-10 DIAGNOSIS — I10 Essential (primary) hypertension: Secondary | ICD-10-CM

## 2021-08-10 DIAGNOSIS — Z1231 Encounter for screening mammogram for malignant neoplasm of breast: Secondary | ICD-10-CM

## 2021-08-11 ENCOUNTER — Ambulatory Visit: Payer: Medicaid Other

## 2021-08-11 ENCOUNTER — Other Ambulatory Visit: Payer: Self-pay | Admitting: Gerontology

## 2021-08-11 ENCOUNTER — Other Ambulatory Visit: Payer: Self-pay

## 2021-08-11 ENCOUNTER — Telehealth: Payer: Self-pay | Admitting: *Deleted

## 2021-08-11 MED FILL — Amlodipine Besylate Tab 10 MG (Base Equivalent): ORAL | 90 days supply | Qty: 90 | Fill #0 | Status: AC

## 2021-08-13 ENCOUNTER — Other Ambulatory Visit: Payer: Medicaid Other

## 2021-08-13 ENCOUNTER — Other Ambulatory Visit: Payer: Self-pay

## 2021-08-13 DIAGNOSIS — I10 Essential (primary) hypertension: Secondary | ICD-10-CM

## 2021-08-14 LAB — COMPREHENSIVE METABOLIC PANEL
ALT: 5 IU/L (ref 0–32)
AST: 9 IU/L (ref 0–40)
Albumin/Globulin Ratio: 1.6 (ref 1.2–2.2)
Albumin: 3.9 g/dL (ref 3.9–4.9)
Alkaline Phosphatase: 163 IU/L — ABNORMAL HIGH (ref 44–121)
BUN/Creatinine Ratio: 12 (ref 9–23)
BUN: 20 mg/dL (ref 6–24)
Bilirubin Total: 0.2 mg/dL (ref 0.0–1.2)
CO2: 21 mmol/L (ref 20–29)
Calcium: 8.8 mg/dL (ref 8.7–10.2)
Chloride: 106 mmol/L (ref 96–106)
Creatinine, Ser: 1.71 mg/dL — ABNORMAL HIGH (ref 0.57–1.00)
Globulin, Total: 2.4 g/dL (ref 1.5–4.5)
Glucose: 93 mg/dL (ref 70–99)
Potassium: 4 mmol/L (ref 3.5–5.2)
Sodium: 140 mmol/L (ref 134–144)
Total Protein: 6.3 g/dL (ref 6.0–8.5)
eGFR: 37 mL/min/{1.73_m2} — ABNORMAL LOW (ref >59)

## 2021-08-18 ENCOUNTER — Ambulatory Visit: Payer: Medicaid Other | Admitting: Gerontology

## 2021-08-18 ENCOUNTER — Other Ambulatory Visit: Payer: Self-pay

## 2021-08-18 ENCOUNTER — Encounter: Payer: Self-pay | Admitting: Gerontology

## 2021-08-18 VITALS — BP 134/77 | HR 69 | Temp 97.8°F | Resp 16 | Ht 61.0 in | Wt 160.3 lb

## 2021-08-18 DIAGNOSIS — N189 Chronic kidney disease, unspecified: Secondary | ICD-10-CM

## 2021-08-18 DIAGNOSIS — R051 Acute cough: Secondary | ICD-10-CM

## 2021-08-18 DIAGNOSIS — R059 Cough, unspecified: Secondary | ICD-10-CM | POA: Insufficient documentation

## 2021-08-18 MED ORDER — BENZONATATE 100 MG PO CAPS
100.0000 mg | ORAL_CAPSULE | Freq: Three times a day (TID) | ORAL | 0 refills | Status: DC | PRN
  Filled 2021-08-18: qty 20, 7d supply, fill #0

## 2021-08-18 NOTE — Progress Notes (Signed)
Established Patient Office Visit  Subjective   Patient ID: Gabrielle Schultz, female    DOB: Apr 15, 1970  Age: 51 y.o. MRN: [id number]  Chief Complaint  Patient presents with   Follow-up    Labs drawn 08/13/21   Cough    Patient c/o cough x 2 weeks    HPI  Gabrielle Schultz is a 51 y.o. female who  has a past medical history of Acid reflux, Allergic rhinitis (06/26/2015), Allergy, Asthma, Carpal tunnel syndrome, Chronic kidney disease, Colitis, CVA (cerebral infarction), Gout, History of syncope, Hypertension (2008), Low HDL (under 40) (06/26/2015), MI (myocardial infarction) ([place]), Migraines, Poor circulation, Renal insufficiency, Stroke ([place]), and Syncope , who presents for follow up visit and medication refill. She no showed to her follow up visit and lab review. Her Serum creatinine has been elevated for many months, decreased from 1.83 mg/dl to 1.71 mg/dl and eGFR was 37. She states that she's can't follow up with Nephrologist Dr Anthonette Legato at [facility] due to cost and will not go to [facility] Nephrologist too. She resumes taking Furosemide 40 mg because of shortness of breath. She c/o of intermittent  non productive cough that has been going on for 2 weeks after a sick contact. She took Delsym without relief. She states that cough wakes her up at night. She denies fever, chills and shortness of breath. Overall, she states that she's doing well and offers no further complaint.  Review of Systems  Constitutional: Negative.   Respiratory:  Positive for cough.   Cardiovascular: Negative.   Genitourinary: Negative.   Neurological: Negative.       Objective:     BP 134/77 (BP Location: Right Arm, Patient Position: Sitting, Cuff Size: Normal)   Pulse 69   Temp 97.8 F (36.6 C) (Oral)   Resp 16   Ht _0  (1.549 m)   Wt 160 lb 4.8 oz (72.7 kg)   LMP 08/18/2020 (Approximate)   SpO2 98%   BMI 30.29 kg/m  BP Readings from Last 3 Encounters:  08/18/21 134/77   05/21/21 (!) 146/75  01/16/21 (!) 150/72   Wt Readings from Last 3 Encounters:  08/18/21 160 lb 4.8 oz (72.7 kg)  05/21/21 161 lb (73 kg)  01/16/21 159 lb 6.4 oz (72.3 kg)      Physical Exam Cardiovascular:     Rate and Rhythm: Normal rate and regular rhythm.     Pulses: Normal pulses.     Heart sounds: Normal heart sounds.  Pulmonary:     Effort: Pulmonary effort is normal.     Breath sounds: Normal breath sounds.  Neurological:     General: No focal deficit present.     Mental Status: She is alert and oriented to person, place, and time. Mental status is at baseline.  Psychiatric:        Mood and Affect: Mood normal.        Behavior: Behavior normal.        Thought Content: Thought content normal.        Judgment: Judgment normal.      No results found for any visits on 08/18/21.  Last CBC Lab Results  Component Value Date   WBC 8.9 05/21/2021   HGB 12.3 05/21/2021   HCT 35.6 05/21/2021   MCV 96 05/21/2021   MCH 33.1 (H) 05/21/2021   RDW 15.4 05/21/2021   PLT 242 97/58/8325   Last metabolic panel Lab Results  Component Value Date   GLUCOSE  93 08/13/2021   NA 140 08/13/2021   K 4.0 08/13/2021   CL 106 08/13/2021   CO2 21 08/13/2021   BUN 20 08/13/2021   CREATININE 1.71 (H) 08/13/2021   EGFR 37 (L) 08/13/2021   CALCIUM 8.8 08/13/2021   PHOS 3.7 07/31/2015   PROT 6.3 08/13/2021   ALBUMIN 3.9 08/13/2021   LABGLOB 2.4 08/13/2021   AGRATIO 1.6 08/13/2021   BILITOT 0.2 08/13/2021   ALKPHOS 163 (H) 08/13/2021   AST 9 08/13/2021   ALT 5 08/13/2021   ANIONGAP 5 07/21/2020   Last lipids Lab Results  Component Value Date   CHOL 187 07/22/2020   HDL 31 (L) 07/22/2020   LDLCALC 119 (H) 07/22/2020   TRIG 185 (H) 07/22/2020   CHOLHDL 6.0 07/22/2020   Last hemoglobin A1c Lab Results  Component Value Date   HGBA1C 5.7 (H) 05/21/2021      The ASCVD Risk score (Arnett DK, et al., 2019) failed to calculate for the following reasons:   The patient has  a prior MI or stroke diagnosis    Assessment & Plan:   1. Chronic renal impairment, unspecified CKD stage - She states that she's unable to follow up with [facility]phrologist at [place], declines [facility], referral will be [facility] to [facility]. She was advised to complete [facility] financial application. - Ambulatory referral to Nephrology  2. Acute cough - She was started on [employer], educated on medication side effects and advised to notify clinic. - benzonatate (TESSALON PERLES) 100 MG capsule; Take 1 capsule (100 mg total) by mouth 3 (three) times daily as needed.  Dispense: 20 capsule; Refill: 0    Return in about 3 months (around 11/19/2021), or if symptoms worsen or fail to improve.    Jacqueline Spofford Jerold Coombe, NP

## 2021-08-19 ENCOUNTER — Inpatient Hospital Stay
Admission: EM | Admit: 2021-08-19 | Discharge: 2021-08-21 | DRG: 065 | Disposition: A | Discharge status: Home / Self Care | Payer: Medicaid Other | Attending: Internal Medicine | Admitting: Internal Medicine

## 2021-08-19 ENCOUNTER — Inpatient Hospital Stay: Payer: Medicaid Other

## 2021-08-19 ENCOUNTER — Other Ambulatory Visit: Payer: Self-pay

## 2021-08-19 ENCOUNTER — Emergency Department: Payer: Medicaid Other

## 2021-08-19 DIAGNOSIS — Z91018 Allergy to other foods: Secondary | ICD-10-CM

## 2021-08-19 DIAGNOSIS — I252 Old myocardial infarction: Secondary | ICD-10-CM | POA: Diagnosis not present

## 2021-08-19 DIAGNOSIS — J449 Chronic obstructive pulmonary disease, unspecified: Secondary | ICD-10-CM | POA: Diagnosis not present

## 2021-08-19 DIAGNOSIS — K219 Gastro-esophageal reflux disease without esophagitis: Secondary | ICD-10-CM | POA: Diagnosis present

## 2021-08-19 DIAGNOSIS — I351 Nonrheumatic aortic (valve) insufficiency: Secondary | ICD-10-CM | POA: Diagnosis present

## 2021-08-19 DIAGNOSIS — N189 Chronic kidney disease, unspecified: Secondary | ICD-10-CM | POA: Diagnosis present

## 2021-08-19 DIAGNOSIS — F17200 Nicotine dependence, unspecified, uncomplicated: Secondary | ICD-10-CM | POA: Diagnosis present

## 2021-08-19 DIAGNOSIS — Z79899 Other long term (current) drug therapy: Secondary | ICD-10-CM

## 2021-08-19 DIAGNOSIS — Z7689 Persons encountering health services in other specified circumstances: Secondary | ICD-10-CM | POA: Diagnosis not present

## 2021-08-19 DIAGNOSIS — R7989 Other specified abnormal findings of blood chemistry: Secondary | ICD-10-CM | POA: Diagnosis present

## 2021-08-19 DIAGNOSIS — F17210 Nicotine dependence, cigarettes, uncomplicated: Secondary | ICD-10-CM | POA: Diagnosis present

## 2021-08-19 DIAGNOSIS — Z841 Family history of disorders of kidney and ureter: Secondary | ICD-10-CM

## 2021-08-19 DIAGNOSIS — E785 Hyperlipidemia, unspecified: Secondary | ICD-10-CM | POA: Diagnosis present

## 2021-08-19 DIAGNOSIS — I10 Essential (primary) hypertension: Secondary | ICD-10-CM

## 2021-08-19 DIAGNOSIS — Z881 Allergy status to other antibiotic agents status: Secondary | ICD-10-CM | POA: Diagnosis not present

## 2021-08-19 DIAGNOSIS — Z720 Tobacco use: Secondary | ICD-10-CM | POA: Diagnosis present

## 2021-08-19 DIAGNOSIS — Z83438 Family history of other disorder of lipoprotein metabolism and other lipidemia: Secondary | ICD-10-CM

## 2021-08-19 DIAGNOSIS — I6389 Other cerebral infarction: Principal | ICD-10-CM | POA: Diagnosis present

## 2021-08-19 DIAGNOSIS — Z7982 Long term (current) use of aspirin: Secondary | ICD-10-CM | POA: Diagnosis not present

## 2021-08-19 DIAGNOSIS — R29703 NIHSS score 3: Secondary | ICD-10-CM | POA: Diagnosis present

## 2021-08-19 DIAGNOSIS — Z880 Allergy status to penicillin: Secondary | ICD-10-CM | POA: Diagnosis not present

## 2021-08-19 DIAGNOSIS — N183 Chronic kidney disease, stage 3 unspecified: Secondary | ICD-10-CM | POA: Diagnosis present

## 2021-08-19 DIAGNOSIS — R778 Other specified abnormalities of plasma proteins: Secondary | ICD-10-CM | POA: Diagnosis present

## 2021-08-19 DIAGNOSIS — I129 Hypertensive chronic kidney disease with stage 1 through stage 4 chronic kidney disease, or unspecified chronic kidney disease: Secondary | ICD-10-CM | POA: Diagnosis present

## 2021-08-19 DIAGNOSIS — Z833 Family history of diabetes mellitus: Secondary | ICD-10-CM

## 2021-08-19 DIAGNOSIS — Z7902 Long term (current) use of antithrombotics/antiplatelets: Secondary | ICD-10-CM

## 2021-08-19 DIAGNOSIS — Z8249 Family history of ischemic heart disease and other diseases of the circulatory system: Secondary | ICD-10-CM

## 2021-08-19 DIAGNOSIS — N1832 Chronic kidney disease, stage 3b: Secondary | ICD-10-CM | POA: Diagnosis not present

## 2021-08-19 DIAGNOSIS — I6789 Other cerebrovascular disease: Secondary | ICD-10-CM | POA: Diagnosis present

## 2021-08-19 DIAGNOSIS — Z20822 Contact with and (suspected) exposure to covid-19: Secondary | ICD-10-CM | POA: Diagnosis present

## 2021-08-19 DIAGNOSIS — I639 Cerebral infarction, unspecified: Secondary | ICD-10-CM | POA: Diagnosis not present

## 2021-08-19 DIAGNOSIS — Z825 Family history of asthma and other chronic lower respiratory diseases: Secondary | ICD-10-CM | POA: Diagnosis not present

## 2021-08-19 DIAGNOSIS — I63532 Cerebral infarction due to unspecified occlusion or stenosis of left posterior cerebral artery: Secondary | ICD-10-CM | POA: Diagnosis not present

## 2021-08-19 DIAGNOSIS — I69351 Hemiplegia and hemiparesis following cerebral infarction affecting right dominant side: Secondary | ICD-10-CM | POA: Diagnosis not present

## 2021-08-19 DIAGNOSIS — Z7951 Long term (current) use of inhaled steroids: Secondary | ICD-10-CM

## 2021-08-19 DIAGNOSIS — R29818 Other symptoms and signs involving the nervous system: Secondary | ICD-10-CM | POA: Diagnosis not present

## 2021-08-19 DIAGNOSIS — G9389 Other specified disorders of brain: Secondary | ICD-10-CM | POA: Diagnosis not present

## 2021-08-19 LAB — BASIC METABOLIC PANEL
Anion gap: 8 (ref 5–15)
BUN: 22 mg/dL — ABNORMAL HIGH (ref 6–20)
CO2: 20 mmol/L — ABNORMAL LOW (ref 22–32)
Calcium: 9 mg/dL (ref 8.9–10.3)
Chloride: 111 mmol/L (ref 98–111)
Creatinine, Ser: 1.57 mg/dL — ABNORMAL HIGH (ref 0.44–1.00)
GFR, Estimated: 40 mL/min — ABNORMAL LOW (ref >60)
Glucose, Bld: 95 mg/dL (ref 70–99)
Potassium: 3.7 mmol/L (ref 3.5–5.1)
Sodium: 139 mmol/L (ref 135–145)

## 2021-08-19 LAB — TROPONIN I (HIGH SENSITIVITY)
Troponin I (High Sensitivity): 66 ng/L — ABNORMAL HIGH (ref <18)
Troponin I (High Sensitivity): 66 ng/L — ABNORMAL HIGH (ref <18)
Troponin I (High Sensitivity): 66 ng/L — ABNORMAL HIGH (ref <18)
Troponin I (High Sensitivity): 69 ng/L — ABNORMAL HIGH (ref <18)

## 2021-08-19 LAB — CBC
HCT: 38 % (ref 36.0–46.0)
Hemoglobin: 12.9 g/dL (ref 12.0–15.0)
MCH: 32.9 pg (ref 26.0–34.0)
MCHC: 33.9 g/dL (ref 30.0–36.0)
MCV: 96.9 fL (ref 80.0–100.0)
Platelets: 226 10*3/uL (ref 150–400)
RBC: 3.92 MIL/uL (ref 3.87–5.11)
RDW: 14.7 % (ref 11.5–15.5)
WBC: 8.2 10*3/uL (ref 4.0–10.5)
nRBC: 0 % (ref 0.0–0.2)

## 2021-08-19 LAB — RESP PANEL BY RT-PCR (FLU A&B, COVID) ARPGX2
Influenza A by PCR: NEGATIVE
Influenza B by PCR: NEGATIVE
SARS Coronavirus 2 by RT PCR: NEGATIVE

## 2021-08-19 LAB — CBG MONITORING, ED: Glucose-Capillary: 100 mg/dL — ABNORMAL HIGH (ref 70–99)

## 2021-08-19 LAB — TSH: TSH: 1.356 u[IU]/mL (ref 0.350–4.500)

## 2021-08-19 MED ORDER — LORAZEPAM 2 MG/ML IJ SOLN
1.0000 mg | Freq: Once | INTRAMUSCULAR | Status: AC | PRN
  Administered 2021-08-19: 1 mg via INTRAVENOUS
  Filled 2021-08-19: qty 1

## 2021-08-19 MED ORDER — ACETAMINOPHEN 500 MG PO TABS
500.0000 mg | ORAL_TABLET | Freq: Four times a day (QID) | ORAL | Status: DC | PRN
Start: 2021-08-19 — End: 2021-08-21
  Administered 2021-08-20: 500 mg via ORAL
  Filled 2021-08-19: qty 1

## 2021-08-19 MED ORDER — ALBUTEROL SULFATE (2.5 MG/3ML) 0.083% IN NEBU: 3.0000 mL | INHALATION_SOLUTION | RESPIRATORY_TRACT | Status: DC | PRN

## 2021-08-19 MED ORDER — ASPIRIN 81 MG PO TBEC
81.0000 mg | DELAYED_RELEASE_TABLET | Freq: Every day | ORAL | Status: DC
  Administered 2021-08-20 – 2021-08-21 (×2): 81 mg via ORAL
  Filled 2021-08-19 (×2): qty 1

## 2021-08-19 MED ORDER — ACETAMINOPHEN 325 MG PO TABS: 650.0000 mg | ORAL_TABLET | ORAL | Status: DC | PRN

## 2021-08-19 MED ORDER — SODIUM CHLORIDE 0.9 % IV BOLUS
500.0000 mL | Freq: Once | INTRAVENOUS | Status: AC
Start: 2021-08-19 — End: 2021-08-19
  Administered 2021-08-19: 500 mL via INTRAVENOUS

## 2021-08-19 MED ORDER — LABETALOL HCL 5 MG/ML IV SOLN: 10.0000 mg | Freq: Four times a day (QID) | INTRAVENOUS | Status: DC | PRN

## 2021-08-19 MED ORDER — STROKE: EARLY STAGES OF RECOVERY BOOK: Freq: Once | Status: AC

## 2021-08-19 MED ORDER — GUAIFENESIN ER 600 MG PO TB12
1200.0000 mg | ORAL_TABLET | Freq: Two times a day (BID) | ORAL | Status: DC
  Administered 2021-08-19 – 2021-08-21 (×4): 1200 mg via ORAL
  Filled 2021-08-19 (×4): qty 2

## 2021-08-19 MED ORDER — CLOPIDOGREL BISULFATE 75 MG PO TABS
75.0000 mg | ORAL_TABLET | Freq: Every day | ORAL | Status: DC
  Administered 2021-08-20 – 2021-08-21 (×2): 75 mg via ORAL
  Filled 2021-08-19 (×2): qty 1

## 2021-08-19 MED ORDER — LORATADINE 10 MG PO TABS
10.0000 mg | ORAL_TABLET | Freq: Every day | ORAL | Status: DC
  Administered 2021-08-20 – 2021-08-21 (×2): 10 mg via ORAL
  Filled 2021-08-19 (×2): qty 1

## 2021-08-19 MED ORDER — ACETAMINOPHEN 650 MG RE SUPP: 650.0000 mg | RECTAL | Status: DC | PRN

## 2021-08-19 MED ORDER — PANTOPRAZOLE SODIUM 40 MG PO TBEC
40.0000 mg | DELAYED_RELEASE_TABLET | Freq: Every day | ORAL | Status: DC
  Administered 2021-08-19 – 2021-08-21 (×3): 40 mg via ORAL
  Filled 2021-08-19 (×2): qty 1

## 2021-08-19 MED ORDER — MOMETASONE FURO-FORMOTEROL FUM 200-5 MCG/ACT IN AERO
2.0000 | INHALATION_SPRAY | Freq: Two times a day (BID) | RESPIRATORY_TRACT | Status: DC
  Administered 2021-08-19 – 2021-08-21 (×4): 2 via RESPIRATORY_TRACT
  Filled 2021-08-19: qty 8.8

## 2021-08-19 MED ORDER — DIAZEPAM 5 MG/ML IJ SOLN
1.0000 mg | Freq: Once | INTRAMUSCULAR | Status: AC
  Administered 2021-08-19: 1 mg via INTRAVENOUS
  Filled 2021-08-19: qty 2

## 2021-08-19 MED ORDER — ALLOPURINOL 100 MG PO TABS
100.0000 mg | ORAL_TABLET | Freq: Every day | ORAL | Status: DC
  Administered 2021-08-20 – 2021-08-21 (×2): 100 mg via ORAL
  Filled 2021-08-19 (×2): qty 1

## 2021-08-19 MED ORDER — ATORVASTATIN CALCIUM 80 MG PO TABS
80.0000 mg | ORAL_TABLET | Freq: Every day | ORAL | Status: DC
  Administered 2021-08-19 – 2021-08-21 (×2): 80 mg via ORAL
  Filled 2021-08-19 (×3): qty 1

## 2021-08-19 MED ORDER — ACETAMINOPHEN 160 MG/5ML PO SOLN: 650.0000 mg | ORAL | Status: DC | PRN

## 2021-08-19 NOTE — Assessment & Plan Note (Addendum)
Can go back on her clonidine and Lasix as outpatient.  Continue to hold Norvasc.

## 2021-08-19 NOTE — Assessment & Plan Note (Signed)
Patient noted to have elevated troponin levels most likely related to acute stroke Denies having any chest pain or shortness of breath We will cycle troponin levels Obtain 2D echocardiogram to assess LVEF and rule out RWMA Continue aspirin, Plavix and statin

## 2021-08-19 NOTE — ED Notes (Signed)
Informed RN bed assigned 

## 2021-08-19 NOTE — Assessment & Plan Note (Addendum)
Creatinine 1.57 with a GFR 40

## 2021-08-19 NOTE — ED Notes (Signed)
Patient able to stand and pivot to the bedside commode. Urine unable to be obtained due to being mixed with stool

## 2021-08-19 NOTE — Assessment & Plan Note (Addendum)
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 Multiple remote infarcts and chronic microvascular ischemic disease.  Case discussed with neurology and recommended continuing dual antiplatelet therapy with aspirin and Plavix.  We were able to set up with a Zio patch upon discharge to see if any heart arrhythmias.  Reviewed prior hypercoagulable work-up and I did not see a factor V Leiden so I ordered that and that result is pending.  Neurology recommended high-dose atorvastatin and follow-up with hematology as outpatient.  LDL of 78 with a goal less than 70.

## 2021-08-19 NOTE — Assessment & Plan Note (Signed)
Smoking cessation was discussed with patient in detail She declines a nicotine transdermal patch at this time 

## 2021-08-19 NOTE — Assessment & Plan Note (Signed)
Stable and not acutely exacerbated Continue as needed bronchodilator therapy Continue inhaled steroids 

## 2021-08-19 NOTE — ED Provider Notes (Signed)
[facility] Provider Note    Event Date/Time   First MD Initiated Contact with Patient 08/19/21 1212     (approximate)   History   Weakness   HPI  Gabrielle Schultz is a 51 y.o. female with a history of hypertension, hyperlipidemia, CKD, GERD, asthma, and CVA who presents with generalized weakness, acute onset about 2 hours ago, and associated with lightheadedness.  The patient states that her legs feel weak and like "noodles" but this is equal across both legs.  She states her arms also feel somewhat strange.  She denies any headache, vision changes, difficulty speaking, numbness in any of her extremities, any actual weakness, chest or abdominal pain, difficulty breathing, or other focal symptoms.  She states that she initially got up this morning around 6 AM and felt fine, then went back to bed, and then the symptoms began when she got up a second time.  She denies any sick contacts or other recent illness.     Physical Exam   Triage Vital Signs: ED Triage Vitals  Enc Vitals Group     BP 08/19/21 1214 (!) 154/49     Pulse Rate 08/19/21 1214 (!) 59     Resp 08/19/21 1214 19     Temp 08/19/21 1214 97.9 F (36.6 C)     Temp Source 08/19/21 1214 Oral     SpO2 08/19/21 1214 98 %     Weight --      Height --      Head Circumference --      Peak Flow --      Pain Score 08/19/21 1211 0     Pain Loc --      Pain Edu? --      Excl. in [place]? --     Most recent vital signs: Vitals:   08/19/21 1230 08/19/21 1300  BP: 136/70 (!) 155/67  Pulse: (!) 57 66  Resp: 10 10  Temp:    SpO2: 99% 100%     General: Alert and oriented, well-appearing. CV:  Good peripheral perfusion.  Normal heart sounds. Resp:  Normal effort.  Lungs CTAB. Abd:  No distention.  Other:  EOMI.  PERRLA.  No photophobia.  No facial droop.  Motor and sensory intact in all extremities.  No pronator drift.  No ataxia on finger-to-nose.   ED Results / Procedures / Treatments    Labs (all labs ordered are listed, but only abnormal results are displayed) Labs Reviewed  BASIC METABOLIC PANEL - Abnormal; Notable for the following components:      Result Value   CO2 20 (*)    BUN 22 (*)    Creatinine, Ser 1.57 (*)    GFR, Estimated 40 (*)    All other components within normal limits  CBG MONITORING, ED - Abnormal; Notable for the following components:   Glucose-Capillary 100 (*)    All other components within normal limits  TROPONIN I (HIGH SENSITIVITY) - Abnormal; Notable for the following components:   Troponin I (High Sensitivity) 66 (*)    All other components within normal limits  RESP PANEL BY RT-PCR (FLU A&B, COVID) ARPGX2  CBC  TSH  URINALYSIS, ROUTINE W REFLEX MICROSCOPIC  POC URINE PREG, ED  TROPONIN I (HIGH SENSITIVITY)     EKG  ED ECG REPORT I, Arta Silence, the attending physician, personally viewed and interpreted this ECG.  Date: 08/19/2021 EKG Time: 1216 Rate: 54 Rhythm: normal sinus rhythm QRS Axis: normal Intervals:  Nonspecific IVCD ST/T Wave abnormalities: normal Narrative Interpretation: no evidence of acute ischemia; no significant change when compared to EKG of 07/21/2020    RADIOLOGY  CT head: I independently viewed and interpreted the images; there is no apparent ICH.  Radiology report indicates right frontal temporal infarct with possible petechial hemorrhage.  PROCEDURES:  Critical Care performed: No  Procedures   MEDICATIONS ORDERED IN ED: Medications  sodium chloride 0.9 % bolus 500 mL (0 mLs Intravenous Stopped 08/19/21 1438)  LORazepam (ATIVAN) injection 1 mg (1 mg Intravenous Given 08/19/21 1436)     IMPRESSION / MDM / ASSESSMENT AND PLAN / ED COURSE  I reviewed the triage vital signs and the nursing notes.  51 year old female with PMH as noted above presents with acute onset of generalized weakness and lightheadedness about 2 hours ago with no focal neurologic symptoms.  I reviewed the past  medical records.  The patient was most recently admitted in July 2022 and discharge summary from 07/22/2020 indicates that she was evaluated for TIA with right-sided hemiparesis and expressive aphasia that resolved.  Differential diagnosis includes, but is not limited to, dehydration, electrolyte abnormality, AKI, hypoglycemia, other metabolic disturbance, [id number] or other viral prodrome, less likely primary cardiac cause.  I do not suspect TIA or other primary CNS cause given the lack of any focal neurologic symptoms.  Patient's presentation is most consistent with acute presentation with potential threat to life or bodily function.  We will obtain CT head, lab work-up, respiratory panel, give fluids, and reassess.  The patient is on the cardiac monitor to evaluate for evidence of arrhythmia and/or significant heart rate changes.  ----------------------------------------- 1:52 PM on 08/19/2021 -----------------------------------------  CT shows possible right frontoparietal acute or subacute infarct although this does not correspond to the patient's symptoms.  She still has no focal neurologic deficits.  I consulted Dr. Quinn Axe from neurology who recommends an MRI.  ----------------------------------------- 3:59 PM on 08/19/2021 -----------------------------------------  MRI is consistent with subacute CVA.  The patient's troponin is elevated.  She has no chest pain.  She continues to have no focal neurologic symptoms.  I contacted Dr. Quinn Axe who is in agreement with admission for further evaluation.  Other lab work-up is unremarkable.  I then consulted Dr. Francine Graven from the hospitalist service; based on her discussion she agrees to admit the patient.   FINAL CLINICAL IMPRESSION(S) / ED DIAGNOSES   Final diagnoses:  Cerebrovascular accident (CVA), unspecified mechanism ([place])     Rx / DC Orders   ED Discharge Orders     None        Note:  This document was prepared using Dragon  voice recognition software and may include unintentional dictation errors.    Arta Silence, MD 08/19/21 1600

## 2021-08-19 NOTE — H&P (Addendum)
History and Physical    Patient: Gabrielle Schultz BOF:[id number] DOB: 11-20-1970 DOA: 08/19/2021 DOS: the patient was seen and examined on 08/19/2021 PCP: Langston Reusing, NP  Patient coming from: Home  Chief Complaint:  Chief Complaint  Patient presents with   Weakness   HPI: Gabrielle Schultz is a 51 y.o. female with medical history significant for some peripheral venous hypertension with stage IIIb chronic kidney disease, history of CVA with right-sided hemiparesis, history of colitis, GERD, nicotine dependence who presents to the emergency room via EMS for evaluation of weakness which started this morning at about 11 AM. Patient states that she woke up between 5 and 6 AM and made breakfast for her husband prior to him leaving for work.  She took her blood pressure medications and went to bed and woke up at 11 AM.  She states that she did not feel right when she woke up and that both legs felt very weak.  She complains of feeling dizzy but denies any falls or loss of consciousness.  She has a cough but denies having any fever or chills. She denied having any slurred speech, no blurred vision, no headache, no focal deficit, no nausea, no vomiting, no chest pain, no shortness of breath, no urinary symptoms or changes in her bowel habits. EMS was called and per chart review mini neuro exam done by EMS was negative for an acute stroke. Patient had a CT scan of the head without contrast which shows new hypoattenuation and loss of gray white differentiation in the high right frontoparietal region, concerning for acute or subacute infarct. Possible slight petechial hemorrhage. No mass occupying acute hemorrhage. An MRI could further evaluate if clinically warranted. Remote left occipital infarct. Patient had an MRI of the brain without contrast which showed findings on same day CT head correlates with a probably subacute parietal infarct with evidence of cortical laminar necrosis. Multiple remote  infarcts and chronic microvascular ischemic disease, described above  Review of Systems: As mentioned in the history of present illness. All other systems reviewed and are negative. Past Medical History:  Diagnosis Date   Acid reflux    Allergic rhinitis 06/26/2015   Allergy    Asthma    Carpal tunnel syndrome    right hand   Chronic kidney disease    Colitis    CVA (cerebral infarction)    2009, 2015   Gout    History of syncope    Hypertension 2008   Low HDL (under 40) 06/26/2015   MI (myocardial infarction) ([place])    2015 - patient wasnt told by cardiologist that she had MI but has had cardiac cath in past   Migraines    Poor circulation    Renal insufficiency    Stroke [facility])    Syncope and collapse    Past Surgical History:  Procedure Laterality Date   BUBBLE STUDY  12/03/2019   Procedure: BUBBLE STUDY;  Surgeon: Geralynn Rile, MD;  Location: [name];  Service: Cardiovascular;;   CARDIAC CATHETERIZATION     CESAREAN SECTION  1994   Dr. Ammie Dalton   COLONOSCOPY WITH PROPOFOL N/A 06/26/2014   Procedure: COLONOSCOPY WITH PROPOFOL;  Surgeon: Josefine Class, MD;  Location: [facility] ENDOSCOPY;  Service: Endoscopy;  Laterality: N/A;   DILATION AND CURETTAGE OF UTERUS  x2 for incomplete SABs   TEE WITHOUT CARDIOVERSION N/A 12/03/2019   Procedure: TRANSESOPHAGEAL ECHOCARDIOGRAM (TEE);  Surgeon: Geralynn Rile, MD;  Location: [place];  Service:  Cardiovascular;  Laterality: N/A;   Social History:  reports that she has been smoking cigarettes. She started smoking about 4 years ago. She has a 8.25 pack-year smoking history. She has never used smokeless tobacco. She reports current alcohol use. She reports that she does not use drugs.  Allergies  Allergen Reactions   Coconut (Cocos Nucifera) Hives and Swelling    Just coconut ("all coconut")   Amoxicillin Other (See Comments)    "makes me sick" "swelled up everywhere" Has patient had a PCN reaction causing  immediate rash, facial/tongue/throat swelling, SOB or lightheadedness with hypotension: No Has patient had a PCN reaction causing severe rash involving mucus membranes or skin necrosis: No Has patient had a PCN reaction that required hospitalization: No Has patient had a PCN reaction occurring within the last 10 years: No If all of the above answers are "NO", then may proceed with Cephalosporin use.     Penicillins Other (See Comments)    "makes me sick" "swelled up oil" Has patient had a PCN reaction causing immediate rash, facial/tongue/throat swelling, SOB or lightheadedness with hypotension: No Has patient had a PCN reaction causing severe rash involving mucus membranes or skin necrosis: No Has patient had a PCN reaction that required hospitalization: No Has patient had a PCN reaction occurring within the last 10 years: Yes If all of the above answers are "NO", then may proceed with Cephalosporin use.    Singulair [Montelukast Sodium] Rash    "made heart race"    Family History  Problem Relation Age of Onset   Hyperlipidemia Mother    Heart disease Mother        has a defibrillator   COPD Mother    Hypertension Father    Heart disease Father    Hyperlipidemia Father    Hypertension Sister        died in her 2s from heart failure and end stage kidney disease   Kidney disease Sister    Hypertension Brother    Heart disease Brother        has a defibrillator   Coronary artery disease Brother        had CABG   Hyperlipidemia Brother    Healthy Child    Diabetes Child     Prior to Admission medications   Medication Sig Start Date End Date Taking? Authorizing Provider  acetaminophen (TYLENOL) 500 MG tablet Take 500 mg by mouth every 6 (six) hours as needed for fever or mild pain.    [provider]  albuterol (PROVENTIL HFA) 108 (90 Base) MCG/ACT inhaler Inhale 2 puffs into the lungs once every 4 (four) hours as needed for wheezing (cough). 05/21/21   Iloabachie,  Chioma E, NP  allopurinol (ZYLOPRIM) 100 MG tablet TAKE ONE TABLET BY MOUTH ONCE DAILY. 05/21/21   Iloabachie, Chioma E, NP  amLODipine (NORVASC) 10 MG tablet TAKE ONE TABLET BY MOUTH ONCE DAILY. 08/11/21   Iloabachie, Chioma E, NP  aspirin EC 81 MG tablet Take 1 tablet (81 mg total) by mouth once daily. Swallow whole. 05/21/21   Iloabachie, Chioma E, NP  atorvastatin (LIPITOR) 40 MG tablet Take 1 tablet (40 mg total) by mouth once daily. 05/21/21   Iloabachie, Chioma E, NP  benzonatate (TESSALON PERLES) 100 MG capsule Take 1 capsule (100 mg total) by mouth 3 (three) times daily as needed. 08/18/21   Iloabachie, Chioma E, NP  cetirizine (ZYRTEC) 10 MG tablet TAKE ONE TABLET BY MOUTH ONCE DAILY. 05/21/21   Iloabachie, Chioma E,  NP  cloNIDine (CATAPRES) 0.3 MG tablet Take 1 tablet by mouth 3 times daily (morning, noon, and evening). 05/21/21   Iloabachie, Chioma E, NP  clopidogrel (PLAVIX) 75 MG tablet Take 1 tablet (75 mg total) by mouth once daily. 05/21/21   Iloabachie, Chioma E, NP  fluticasone (FLONASE) 50 MCG/ACT nasal spray SPRAY 1 SPRAY INTO BOTH NOSTRILS 2 TIMES A DAY 05/21/21   Iloabachie, Chioma E, NP  fluticasone-salmeterol (ADVAIR DISKUS) 250-50 MCG/ACT AEPB Inhale 1 puff into the lungs in the morning and at bedtime. 05/21/21   Iloabachie, Chioma E, NP  furosemide (LASIX) 40 MG tablet Take 40 mg by mouth daily.    [provider]  lidocaine (XYLOCAINE) 5 % ointment Apply 1 application to the affected area(s) topically once daily as needed. 05/21/21   Iloabachie, Chioma E, NP  losartan (COZAAR) 25 MG tablet Take 1 tablet (25 mg total) by mouth once daily. 05/21/21   Iloabachie, Chioma E, NP  omeprazole (PRILOSEC) 20 MG capsule TAKE ONE CAPSULE BY MOUTH ONCE DAILY. 05/21/21   Iloabachie, Chioma E, NP  potassium chloride SA (KLOR-CON) 20 MEQ tablet Take 1 tablet (20 mEq total) by mouth once daily as needed (potassium). 5/46/56   Copland, Deirdre Evener, PA-C  promethazine (PHENERGAN) 25 MG tablet TAKE 1  TABLET BY MOUTH ONCE EVERY 6 HOURS AS NEEDED FOR NAUSEA OR VOMITING. 05/21/21   Iloabachie, Chioma E, NP  simvastatin (ZOCOR) 10 MG tablet TAKE ONE TABLET BY MOUTH EVERY DAY Patient not taking: Reported on 07/22/2020 05/22/20 08/15/73  Amalia Greenhouse    Physical Exam: Vitals:   08/19/21 1214 08/19/21 1215 08/19/21 1230 08/19/21 1300  BP: (!) 154/49 (!) 154/49 136/70 (!) 155/67  Pulse: (!) 59 (!) 59 (!) 57 66  Resp: 19 10 10 10   Temp: 97.9 F (36.6 C)     TempSrc: Oral     SpO2: 98% 97% 99% 100%   Physical Exam Vitals and nursing note reviewed.  Constitutional:      Appearance: Normal appearance.  HENT:     Head: Normocephalic and atraumatic.     Nose: Nose normal.     Mouth/Throat:     Mouth: Mucous membranes are moist.  Eyes:     Conjunctiva/sclera: Conjunctivae normal.  Cardiovascular:     Rate and Rhythm: Bradycardia present.  Pulmonary:     Effort: Pulmonary effort is normal.     Breath sounds: Normal breath sounds.  Abdominal:     General: Abdomen is flat. Bowel sounds are normal.     Palpations: Abdomen is soft.  Musculoskeletal:        General: Normal range of motion.     Cervical back: Normal range of motion and neck supple.  Skin:    General: Skin is warm and dry.  Neurological:     General: No focal deficit present.     Mental Status: She is alert.     Comments: Able to move all extremities  Psychiatric:        Mood and Affect: Mood normal.        Behavior: Behavior normal.     Data Reviewed: Relevant notes from primary care and specialist visits, past discharge summaries as available in EHR, including Care Everywhere. Prior diagnostic testing as pertinent to current admission diagnoses Updated medications and problem lists for reconciliation ED course, including vitals, labs, imaging, treatment and response to treatment Triage notes, nursing and pharmacy notes and ED provider's notes Notable results as noted in HPI  Labs reviewed.  TSH 1.356,  troponin 66, sodium 139, potassium 3.7, chloride 111, bicarb 20, glucose 95, BUN 22, creatinine 1.57, calcium 9.0, white count 8.2, hemoglobin 12.9, hematocrit 38, platelet count 226 CT scan of the head without contrast showed new hypoattenuation and loss of gray white differentiation in the high right frontoparietal region, concerning for acute or subacute infarct. Possible slight petechial hemorrhage. No mass occupying acute hemorrhage. An MRI could further evaluate if clinically warranted.Remote left occipital infarct. MRI of the brain without contrast shows findings on same day CT head correlates with a probably subacute parietal infarct with evidence of cortical laminar necrosis.Multiple remote infarcts and chronic microvascular ischemic disease, described above. Twelve-lead EKG reviewed by me shows sinus rhythm with an atypical left bundle branch block There are no new results to review at this time.  Assessment and Plan: * CVA (cerebral vascular accident) [facility]) Patient with a prior history of CVA with mild right-sided hemiparesis who presents for evaluation of bilateral lower extremity weakness and is found to have a subacute parietal infarct with evidence of cortical laminar necrosis. Multiple remote infarcts and chronic microvascular ischemic disease. Continue dual antiplatelet therapy Continue high intensity statins Allow for permissive hypertension We will request PT/OT/ST consult We will obtain 2D echocardiogram to assess LVEF and rule out cardiac thrombus Consult neurology  Elevated troponin Patient noted to have elevated troponin levels most likely related to acute stroke Denies having any chest pain or shortness of breath We will cycle troponin levels Obtain 2D echocardiogram to assess LVEF and rule out RWMA Continue aspirin, Plavix and statin  COPD (chronic obstructive pulmonary disease) (HCC) Stable and not acutely exacerbated Continue as needed bronchodilator  therapy Continue inhaled steroids  Tobacco abuse Smoking cessation was discussed with patient in detail She declines a nicotine transdermal patch at this time  CKD (chronic kidney disease), stage IIIb (HCC) Secondary to hypertension Renal function is at baseline Monitor closely during this hospitalization Reason  Hypertension Allow for permissive hypertension We will place patient on labetalol 10 mg IV for systolic blood pressure greater than 937 mmHg Or diastolic blood pressure greater than 110 mmHg      Advance Care Planning:   Code Status: Full Code   Consults: Neurology  Family Communication: Greater than 50% of time was spent discussing patient's condition and plan of care with her at the bedside.  She verbalizes understanding and agrees with the plan.  Severity of Illness: The appropriate patient status for this patient is INPATIENT. Inpatient status is judged to be reasonable and necessary in order to provide the required intensity of service to ensure the patient's safety. The patient's presenting symptoms, physical exam findings, and initial radiographic and laboratory data in the context of their chronic comorbidities is felt to place them at high risk for further clinical deterioration. Furthermore, it is not anticipated that the patient will be medically stable for discharge from the hospital within 2 midnights of admission.   * I certify that at the point of admission it is my clinical judgment that the patient will require inpatient hospital care spanning beyond 2 midnights from the point of admission due to high intensity of service, high risk for further deterioration and high frequency of surveillance required.*  Author: Collier Bullock, MD 08/19/2021 4:46 PM  For on call review [email or web address].

## 2021-08-19 NOTE — ED Triage Notes (Addendum)
Pt comes via EMs from home with c/o weakness that started today. Pt states she was fine once she got up this am and then went back to sleep. Pt states she then woke up and just didn't feel good. Pt denies any pain, slurring speech, blurry vision. Pt states some dizziness.  Pt states legs feels like spaghetti noodles. CBG-102 BP-160/60  EMS reports mini neuro neg.  Pt states hx of previous TIA and Stroke. Pt is on blood thinner. Pt state she takes plavix.

## 2021-08-20 ENCOUNTER — Ambulatory Visit: Payer: Medicaid Other

## 2021-08-20 ENCOUNTER — Inpatient Hospital Stay (HOSPITAL_COMMUNITY)
Admit: 2021-08-20 | Discharge: 2021-08-20 | Disposition: A | Discharge status: Home / Self Care | Payer: Medicaid Other | Attending: Neurology | Admitting: Neurology

## 2021-08-20 DIAGNOSIS — J449 Chronic obstructive pulmonary disease, unspecified: Secondary | ICD-10-CM

## 2021-08-20 DIAGNOSIS — Z720 Tobacco use: Secondary | ICD-10-CM

## 2021-08-20 DIAGNOSIS — I6389 Other cerebral infarction: Secondary | ICD-10-CM

## 2021-08-20 DIAGNOSIS — R778 Other specified abnormalities of plasma proteins: Secondary | ICD-10-CM

## 2021-08-20 LAB — LIPID PANEL
Cholesterol: 123 mg/dL (ref 0–200)
HDL: 19 mg/dL — ABNORMAL LOW (ref >40)
LDL Cholesterol: 78 mg/dL (ref 0–99)
Total CHOL/HDL Ratio: 6.5 RATIO
Triglycerides: 128 mg/dL (ref <150)
VLDL: 26 mg/dL (ref 0–40)

## 2021-08-20 LAB — HIV ANTIBODY (ROUTINE TESTING W REFLEX): HIV Screen 4th Generation wRfx: NONREACTIVE

## 2021-08-20 MED ORDER — CLONIDINE HCL 0.1 MG PO TABS
0.2000 mg | ORAL_TABLET | Freq: Three times a day (TID) | ORAL | Status: DC
  Administered 2021-08-20: 0.2 mg via ORAL
  Filled 2021-08-20: qty 2

## 2021-08-20 MED ORDER — CLONIDINE HCL 0.1 MG PO TABS
0.3000 mg | ORAL_TABLET | Freq: Three times a day (TID) | ORAL | Status: DC
  Administered 2021-08-20 – 2021-08-21 (×3): 0.3 mg via ORAL
  Filled 2021-08-20 (×3): qty 3

## 2021-08-20 NOTE — Progress Notes (Signed)
SLP Cancellation Note  Patient Details Name: Gabrielle Schultz MRN: [id number] DOB: 09-Jul-1970   Cancelled treatment:       Reason Eval/Treat Not Completed: Patient at procedure or test/unavailable.   SLP consult received and appreciated. Chart review completed. Cognitive-linguistic evaluation unable to be completed at this time as pt OTF for bubble study. Will continue efforts as appropriate.  Cherrie Gauze, M.S., [facility] [phone] ([place])  Gabrielle Schultz 08/20/2021, 8:15 AM

## 2021-08-20 NOTE — Progress Notes (Signed)
Progress Note   Patient: Gabrielle Schultz EXH:[id number] DOB: Jul 06, 1970 DOA: 08/19/2021     1 DOS: the patient was seen and examined on 08/20/2021     Assessment and Plan: * CVA (cerebral vascular accident) [facility]) Patient with a prior history of CVA with mild right-sided hemiparesis who presents for evaluation of bilateral lower extremity weakness and is found to have a subacute parietal infarct with evidence of cortical laminar necrosis. Multiple remote infarcts and chronic microvascular ischemic disease. Continue dual antiplatelet therapy.  Case discussed with neurology and she would like to evaluate today and again tomorrow.  Continue Lipitor.  Reviewed prior hypercoagulable work-up and I did not see a factor V Leiden so I ordered that.  Bubble echocardiogram pending.  Elevated troponin Likely elevated secondary to stroke.  COPD (chronic obstructive pulmonary disease) ([place]) Patient must stop smoking.  Tobacco abuse Patient needs to stop smoking with her history  CKD (chronic kidney disease), stage IIIb (HCC) Check creatinine again tomorrow.  Hypertension I restarted her clonidine because she states that she needs this for her mental wellbeing and also to prevent headache.  Holding other antihypertensive medications currently.        Subjective: Patient states that she has a positive family history of people dying at a young age including her brother sister mother and father.  The patient does not recall on why she ended up here in the hospital.  She was told that she did not look right and her sister-in-law called 911.  MRI consistent with a subacute parietal infarct.  Physical Exam: Vitals:   08/20/21 1000 08/20/21 1024 08/20/21 1121 08/20/21 1622  BP:  (!) 181/79 (!) 152/62 (!) 154/72  Pulse:   65 74  Resp:   14 19  Temp: 98.4 F (36.9 C)  97.9 F (36.6 C) 99 F (37.2 C)  TempSrc: Oral     SpO2:   100% 100%   Physical Exam HENT:     Head: Normocephalic.      Mouth/Throat:     Pharynx: No oropharyngeal exudate.  Eyes:     General: Lids are normal.     Conjunctiva/sclera: Conjunctivae normal.  Cardiovascular:     Rate and Rhythm: Normal rate and regular rhythm.     Heart sounds: S1 normal and S2 normal. Murmur heard.     Systolic murmur is present with a grade of 2/6.  Pulmonary:     Breath sounds: No decreased breath sounds, wheezing, rhonchi or rales.  Abdominal:     Palpations: Abdomen is soft.     Tenderness: There is no abdominal tenderness.  Musculoskeletal:     Right lower leg: No swelling.     Left lower leg: No swelling.  Skin:    General: Skin is warm.     Findings: No rash.  Neurological:     Mental Status: She is alert and oriented to person, place, and time.     Comments: Chronic right-sided weakness secondary to prior stroke.  Power 4 out of 5 right upper and lower extremity.  5 out of 5 left upper and lower extremity.     Data Reviewed: MRI of the brain shows a subacute parietal infarct and remote infarcts MRA of the head and neck showed no occlusions Creatinine 1.57, LDL 78, CBC normal range  Family Communication: Left message for husband  Disposition: Status is: Inpatient Remains inpatient appropriate because: Echocardiogram still pending.  Neurology would like to watch her overnight and reassess tomorrow also.  Planned  Discharge Destination: Home    Time spent: 28 minutes  Author: Loletha Grayer, MD 08/20/2021 5:12 PM  For on call review [email or web address].

## 2021-08-20 NOTE — Evaluation (Signed)
Occupational Therapy Evaluation Patient Details Name: Gabrielle Schultz MRN: [id number] DOB: Dec 14, 1970 Today's Date: 08/20/2021   History of Present Illness Gabrielle Schultz is a 51 y.o. female with medical history significant for some peripheral venous hypertension with stage IIIb chronic kidney disease, history of CVA with right-sided hemiparesis, history of colitis, GERD, nicotine dependence who presents to the emergency room via EMS for evaluation of weakness. She states that she did not feel right when she woke up on morning on 6/16 and that both legs felt very weak. She complains of feeling dizzy but denies any falls or loss of consciousness, no slurred speech, no blurred vision, no headache, no focal deficit, no nausea, no vomiting, no chest pain, no shortness of breath, no urinary symptoms or changes in her bowel habits. CT scan of the head without contrast shows new hypoattenuation and loss of gray white differentiation in the high right frontoparietal region, concerning for acute or subacute infarct. Possible slight petechial hemorrhage. MRI of the brain without contrast correlates with a probably subacute parietal infarct with evidence of cortical laminar necrosis and multiple remote infarcts and chronic microvascular ischemic disease.   Clinical Impression   Gabrielle Schultz presents with generalized weakness, limited endurance, depressed affect, and R-sided hemiparesis. She lives in a single-story home, 1 STE, with a total of 6 adults and 6 children (age 29 or under) in the household. She ambulates w/in the home w/o AD, uses a cane outside, reports no falls history. She is generally able to complete BADL and IADL with Mod I, receives PRN assistance from her spouse or daughter-in-law if she is feeling tired. During today's OT evaluation, she is able to complete transfers, ambulation, dressing, grooming, toileting, self-feeding without physical assistance, no LOB, and appears to be at her baseline level of  fxl mobility. Provided educ re: smoking cessation/reducation, ECS, activity modifications, with pt verbalizing understanding. She has necessary AD at home and is able to obtain PRN support from family members. No further OT services required at this time.    Recommendations for follow up therapy are one component of a multi-disciplinary discharge planning process, led by the attending physician.  Recommendations may be updated based on patient status, additional functional criteria and insurance authorization.   Follow Up Recommendations  No OT follow up    Assistance Recommended at Discharge Intermittent Supervision/Assistance  Patient can return home with the following A little help with bathing/dressing/bathroom;Assist for transportation    Functional Status Assessment  Patient has had a recent decline in their functional status and demonstrates the ability to make significant improvements in function in a reasonable and predictable amount of time.  Equipment Recommendations  None recommended by OT    Recommendations for Other Services       Precautions / Restrictions Precautions Precautions: None Restrictions Weight Bearing Restrictions: No      Mobility Bed Mobility Overal bed mobility: Modified Independent             General bed mobility comments: Able to complete bed mobility with Mod I, however, reports she sleeps in recliner at home, 2/2 acid reflux    Transfers Overall transfer level: Modified independent Equipment used: Rolling walker (2 wheels)                      Balance Overall balance assessment: Modified Independent  ADL either performed or assessed with clinical judgement   ADL Overall ADL's : At baseline;Needs assistance/impaired                                       General ADL Comments: Able to complete w/ Mod I and compensatory techniques: slip-on shoes, no  socks, tee shirts rather than button-ups, etc.     Vision Patient Visual Report: No change from baseline Additional Comments: impaired R peripheral vision     Perception     Praxis      Pertinent Vitals/Pain Pain Assessment Pain Assessment: No/denies pain     Hand Dominance Right   Extremity/Trunk Assessment Upper Extremity Assessment Upper Extremity Assessment: RUE deficits/detail (R-sided hemiparesis; L side strength and ROM WFT) RUE Sensation: history of peripheral neuropathy;decreased light touch RUE Coordination: decreased fine motor   Lower Extremity Assessment Lower Extremity Assessment: RLE deficits/detail RLE Deficits / Details: 4/5 MMT RLE Sensation: history of peripheral neuropathy;decreased light touch   Cervical / Trunk Assessment Cervical / Trunk Assessment: Normal   Communication Communication Communication: No difficulties   Cognition Arousal/Alertness: Awake/alert Behavior During Therapy: WFL for tasks assessed/performed Overall Cognitive Status: Within Functional Limits for tasks assessed                                 General Comments: Slightly flat affect, reports often feels "down"     General Comments       Exercises Other Exercises Other Exercises: Provided educ re: smoking cessation/reduction; activity modifications   Shoulder Instructions      Home Living Family/patient expects to be discharged to:: Private residence Living Arrangements: Spouse/significant other;Children;Other relatives Available Help at Discharge: Family;Available 24 hours/day Type of Home: House Home Access: Stairs to enter [employer] of Steps: 1 Entrance Stairs-Rails: None Home Layout: One level     Bathroom Shower/Tub: Tub/shower unit;Curtain   Bathroom Toilet: Handicapped height     Home Equipment: [place] (2 wheels);Shower seat   Additional Comments: Pt lives with her spouse and 5-yr-old daughter; her  brother and sister-in-law and their 5 children (oldest is age 75), and her son and daughter-in-law      Prior Functioning/Environment Prior Level of Function : Needs assist             Mobility Comments: Uses cane PRN out of house; no falls history; becomes fatigued with mild activity ADLs Comments: Completes most ADL INDly but occassionally requires assistance with dressing and showering; wears only slip-on shoes, no socks, as cannot reach feet; does not drive; performs cooking, cleaning, laundry, grocery shopping        OT Problem List: Decreased strength;Decreased activity tolerance;Decreased range of motion      OT Treatment/Interventions:      OT Goals(Current goals can be found in the care plan section) Acute Rehab OT Goals Patient Stated Goal: to feel better OT Goal Formulation: With patient Time For Goal Achievement: 09/03/21 Potential to Achieve Goals: Good  OT Frequency:      Co-evaluation              AM-PAC OT "6 Clicks" Daily Activity     Outcome Measure Help from another person eating meals?: None Help from another person taking care of personal grooming?: None Help from another person toileting, which includes using toliet, bedpan, or urinal?: None  Help from another person bathing (including washing, rinsing, drying)?: None Help from another person to put on and taking off regular upper body clothing?: A Little Help from another person to put on and taking off regular lower body clothing?: A Little 6 Click Score: 22   End of Session Equipment Utilized During Treatment: Rolling walker (2 wheels)  Activity Tolerance: Patient tolerated treatment well Patient left: in bed;with call bell/phone within reach;with bed alarm set  OT Visit Diagnosis: Muscle weakness (generalized) (M62.81)                Time: 6387-5643 OT Time Calculation (min): 25 min Charges:  OT General Charges $OT Visit: 1 Visit OT Evaluation $OT Eval Low Complexity: 1 Low OT  Treatments $Self Care/Home Management : 23-37 mins Josiah Lobo, PhD, MS, OTR/L 08/20/21, 10:03 AM

## 2021-08-20 NOTE — Progress Notes (Addendum)
SLP Cancellation Note  Patient Details Name: Gabrielle Schultz MRN: [id number] DOB: 02-Dec-1970   Cancelled treatment:       Reason Eval/Treat Not Completed: SLP screened, no needs identified, will sign off (chart reveiewed; consulted NSG and met w/ pt) Pt was texting on her phone upon entering room.  Pt denied any difficulty swallowing and is currently on a regular diet; tolerates swallowing pills w/ water per NSG. Pt had recently finished eating her complete lunch meal. Pt conversed in conversation w/out overt expressive/receptive deficits noted; pt denied any new speech-language deficits. Speech clear, intelligible. Pt did endorse Premorbid hesitations in her thoughts "sometimes" when texting - "I have to slow down and reread what I am sending to finish my thought". She stated this has been ongoing "for a long time". Encouraged her to f/u w/ her PCP or a Neurologist for more formal assessment. Pt stated she had been followed by Dr. Tomi Likens, Neurology but was not longer seeing him d/t finances. Also encouraged pt to f/u w/ SW/CM during this admit for resources to support her and f/u w/ MDs re: her medical care. Pt agreed. No further skilled ST services indicated as pt appears at her baseline. Pt agreed. NSG to reconsult if any change in status while admitted.       Orinda Kenner, MS, CCC-SLP Speech Language Pathologist Rehab Services; [place] [phone] (ascom) Darryll Raju 08/20/2021, 4:08 PM

## 2021-08-20 NOTE — Progress Notes (Signed)
*  PRELIMINARY RESULTS* Echocardiogram 2D Echocardiogram has been performed.  Gabrielle Schultz 08/20/2021, 9:04 AM

## 2021-08-20 NOTE — Evaluation (Signed)
Physical Therapy Evaluation Patient Details Name: Gabrielle Schultz MRN: [id number] DOB: 04/14/1970 Today's Date: 08/20/2021  History of Present Illness  Patient is a 51 year old female with medical history significant for some peripheral venous hypertension with stage IIIb chronic kidney disease, history of CVA with right-sided hemiparesis, history of colitis, GERD, nicotine dependence who presents to the emergency room via EMS for evaluation of bilateral LE weakness. Dound to have a subacute parietal infarct with evidence of cortical laminar necrosis and multiple remote infarcts and chronic microvascular ischemic disease.  Clinical Impression  Patient is agreeable to PT evaluation. She reports having previous strokes with residual right side weakness and uses a cane for community ambulation at baseline.  The patient was able to get out of bed and stand today, Mod I. The patient ambulated a short distance in the hallway with Min guard assistance for safety. She could likely benefit from a rolling walker for community ambulation for safety and fall prevention given fatigue with prolonged standing activity. Anticipated patient can return home at discharge with no PT follow up as she is likely nearing her baseline level of functional mobility. PT will follow while in the hospital to maximize independence in preparation for home discharge.      Recommendations for follow up therapy are one component of a multi-disciplinary discharge planning process, led by the attending physician.  Recommendations may be updated based on patient status, additional functional criteria and insurance authorization.  Follow Up Recommendations No PT follow up      Assistance Recommended at Discharge PRN  Patient can return home with the following  Assist for transportation;Help with stairs or ramp for entrance    Equipment Recommendations Rolling walker (2 wheels)  Recommendations for Other Services       Functional  Status Assessment Patient has had a recent decline in their functional status and demonstrates the ability to make significant improvements in function in a reasonable and predictable amount of time.     Precautions / Restrictions Precautions Precautions: Fall Restrictions Weight Bearing Restrictions: No      Mobility  Bed Mobility Overal bed mobility: Modified Independent                  Transfers Overall transfer level: Modified independent                      Ambulation/Gait Ambulation/Gait assistance: Min guard Gait Distance (Feet): 100 Feet Assistive device: None Gait Pattern/deviations: Decreased stride length, Decreased stance time - right Gait velocity: decreased     General Gait Details: patient agreeable to ambulate in hallway without rolling walker. verbal cues for safety with ambulation with the above gait deficits noted. Min guard provided for safety and patient fatigued with activity. patient could likely benefit from rolling walker for community distance ambulation  Stairs            Wheelchair Mobility    Modified Rankin (Stroke Patients Only)       Balance Overall balance assessment: Needs assistance Sitting-balance support: Feet supported Sitting balance-Leahy Scale: Good     Standing balance support: No upper extremity supported Standing balance-Leahy Scale: Fair                               Pertinent Vitals/Pain Pain Assessment Pain Assessment: No/denies pain    Home Living Family/patient expects to be discharged to:: Private residence Living Arrangements: Spouse/significant other;Children;Other relatives  Available Help at Discharge: Family;Available 24 hours/day Type of Home: House Home Access: Stairs to enter Entrance Stairs-Rails: None Entrance Stairs-Number of Steps: 1   Home Layout: One level Home Equipment: [place] (2 wheels);Shower seat      Prior Function Prior Level of  Function : Needs assist             Mobility Comments: uses a cane for community distances. fatigued with activity, no falls ADLs Comments: occasional assistance from family required     Hand Dominance        Extremity/Trunk Assessment   Upper Extremity Assessment Upper Extremity Assessment: Defer to OT evaluation RUE Sensation: history of peripheral neuropathy;decreased light touch RUE Coordination: decreased fine motor    Lower Extremity Assessment Lower Extremity Assessment: RLE deficits/detail (LLE WNL) RLE Deficits / Details: 4+/5 knee extension, dorsiflexion. RLE Sensation: decreased proprioception    Cervical / Trunk Assessment Cervical / Trunk Assessment: Normal  Communication   Communication: No difficulties  Cognition Arousal/Alertness: Awake/alert Behavior During Therapy: WFL for tasks assessed/performed Overall Cognitive Status: Within Functional Limits for tasks assessed                                 General Comments: patient able to follow all commands without difficulty        General Comments      Exercises     Assessment/Plan    PT Assessment Patient needs continued PT services  PT Problem List Decreased strength;Decreased range of motion;Decreased activity tolerance;Decreased balance;Decreased mobility       PT Treatment Interventions DME instruction;Gait training;Stair training;Functional mobility training;Therapeutic activities;Therapeutic exercise;Balance training;Neuromuscular re-education;Patient/family education    PT Goals (Current goals can be found in the Care Plan section)  Acute Rehab PT Goals Patient Stated Goal: to go home PT Goal Formulation: With patient Time For Goal Achievement: 09/03/21 Potential to Achieve Goals: Good    Frequency Min 2X/week     Co-evaluation               AM-PAC PT "6 Clicks" Mobility  Outcome Measure Help needed turning from your back to your side while in a flat bed  without using bedrails?: None Help needed moving from lying on your back to sitting on the side of a flat bed without using bedrails?: None Help needed moving to and from a bed to a chair (including a wheelchair)?: None Help needed standing up from a chair using your arms (e.g., wheelchair or bedside chair)?: None Help needed to walk in hospital room?: A Little Help needed climbing 3-5 steps with a railing? : A Little 6 Click Score: 22    End of Session   Activity Tolerance: Patient tolerated treatment well Patient left: in bed;with call bell/phone within reach   PT Visit Diagnosis: Unsteadiness on feet (R26.81);Muscle weakness (generalized) (M62.81)    Time: 0865-7846 PT Time Calculation (min) (ACUTE ONLY): 11 min   Charges:   PT Evaluation $PT Eval Low Complexity: 1 Low         [name], PT, MPT  [name] 08/20/2021, 1:53 PM

## 2021-08-20 NOTE — Plan of Care (Signed)
  Problem: Education: Goal: Knowledge of General Education information will improve Description: Including pain rating scale, medication(s)/side effects and non-pharmacologic comfort measures Outcome: Progressing   Problem: Health Behavior/Discharge Planning: Goal: Ability to manage health-related needs will improve Outcome: Progressing   Problem: Clinical Measurements: Goal: Ability to maintain clinical measurements within normal limits will improve Outcome: Progressing Goal: Will remain free from infection Outcome: Progressing Goal: Diagnostic test results will improve Outcome: Progressing Goal: Respiratory complications will improve Outcome: Progressing Goal: Cardiovascular complication will be avoided Outcome: Progressing   Problem: Activity: Goal: Risk for activity intolerance will decrease Outcome: Progressing   Problem: Nutrition: Goal: Adequate nutrition will be maintained Outcome: Progressing   Problem: Coping: Goal: Level of anxiety will decrease Outcome: Progressing   Problem: Elimination: Goal: Will not experience complications related to bowel motility Outcome: Progressing Goal: Will not experience complications related to urinary retention Outcome: Progressing   Problem: Pain Managment: Goal: General experience of comfort will improve Outcome: Progressing   Problem: Safety: Goal: Ability to remain free from injury will improve Outcome: Progressing   Problem: Skin Integrity: Goal: Risk for impaired skin integrity will decrease Outcome: Progressing   Problem: Education: Goal: Knowledge of disease or condition will improve Outcome: Progressing Goal: Knowledge of secondary prevention will improve (SELECT ALL) Outcome: Progressing Goal: Knowledge of patient specific risk factors will improve (INDIVIDUALIZE FOR PATIENT) Outcome: Progressing Goal: Individualized Educational Video(s) Outcome: Progressing   Problem: Coping: Goal: Will verbalize  positive feelings about self Outcome: Progressing Goal: Will identify appropriate support needs Outcome: Progressing   Problem: Nutrition: Goal: Risk of aspiration will decrease Outcome: Progressing   Problem: Education: Goal: Knowledge of General Education information will improve Description: Including pain rating scale, medication(s)/side effects and non-pharmacologic comfort measures Outcome: Progressing   Problem: Health Behavior/Discharge Planning: Goal: Ability to manage health-related needs will improve Outcome: Progressing   Problem: Clinical Measurements: Goal: Ability to maintain clinical measurements within normal limits will improve Outcome: Progressing   Problem: Clinical Measurements: Goal: Will remain free from infection Outcome: Progressing   Problem: Clinical Measurements: Goal: Diagnostic test results will improve Outcome: Progressing   Problem: Clinical Measurements: Goal: Respiratory complications will improve Outcome: Progressing   Problem: Clinical Measurements: Goal: Cardiovascular complication will be avoided Outcome: Progressing   Problem: Activity: Goal: Risk for activity intolerance will decrease Outcome: Progressing   Problem: Nutrition: Goal: Adequate nutrition will be maintained Outcome: Progressing   Problem: Coping: Goal: Level of anxiety will decrease Outcome: Progressing   Problem: Coping: Goal: Level of anxiety will decrease Outcome: Progressing   Problem: Coping: Goal: Level of anxiety will decrease Outcome: Progressing   Problem: Elimination: Goal: Will not experience complications related to bowel motility Outcome: Progressing   Problem: Elimination: Goal: Will not experience complications related to urinary retention Outcome: Progressing   Problem: Pain Managment: Goal: General experience of comfort will improve Outcome: Progressing   Problem: Safety: Goal: Ability to remain free from injury will  improve Outcome: Progressing

## 2021-08-21 ENCOUNTER — Inpatient Hospital Stay (HOSPITAL_COMMUNITY)
Admit: 2021-08-21 | Discharge: 2021-08-21 | Disposition: A | Discharge status: Home / Self Care | Payer: Medicaid Other | Attending: Nurse Practitioner | Admitting: Nurse Practitioner

## 2021-08-21 ENCOUNTER — Other Ambulatory Visit: Payer: Self-pay

## 2021-08-21 ENCOUNTER — Other Ambulatory Visit: Payer: Self-pay | Admitting: Nurse Practitioner

## 2021-08-21 DIAGNOSIS — I639 Cerebral infarction, unspecified: Secondary | ICD-10-CM

## 2021-08-21 LAB — ECHOCARDIOGRAM COMPLETE BUBBLE STUDY
AR max vel: 4.04 cm2
AV Area VTI: 4.47 cm2
AV Area mean vel: 3.96 cm2
AV Mean grad: 5 mmHg
AV Peak grad: 8.9 mmHg
Ao pk vel: 1.49 m/s
Area-P 1/2: 4.63 cm2
Calc EF: 67 %
P 1/2 time: 405 msec
S' Lateral: 3.35 cm
Single Plane A2C EF: 63.3 %
Single Plane A4C EF: 65.5 %

## 2021-08-21 MED ORDER — ATORVASTATIN CALCIUM 80 MG PO TABS
80.0000 mg | ORAL_TABLET | Freq: Every day | ORAL | 0 refills | Status: DC
  Filled 2021-08-21: qty 30, 30d supply, fill #0

## 2021-08-21 NOTE — Progress Notes (Signed)
Neurology brief note  This is a 51 yo woman with hx multiple prior ischemic infarcts with unrevealing workup including partial hypercoag workup who presents after episode of "legs feeling like jelly" and found to have probably subacute parietal infarct with cortical laminar necrosis. On examination she appears to be at her recent baseline as described in Dr. Georgie Chard last outpatient neurology note. I think the parietal subacute infarct was an incidental finding, and certainly does not explain her brief BLE weakness the other day. Factor V Leiden was not included in previous hypercoag workup and is now pending. TTE with bubble pending as well. Prior TTE in 2021 and TEE in 2022 both showed no e/o PFO. I will further review patient's extensive multiple prior workups and follow up with her again in the AM. She will likely need to be referred to hem/onc for further possible hypercoag workup on an outpatient basis if she has not seen them already. Patient is on DAPT. Full consult note to follow. Please do not discharge patient until I have seen her again in AM.  Su Monks, MD [facility] [phone]  If 7pm- 7am, please page neurology on call as listed in [place].

## 2021-08-21 NOTE — Discharge Summary (Signed)
Physician Discharge Summary   Patient: Gabrielle Schultz MRN: [id number] DOB: 10-15-1970  Admit date:     08/19/2021  Discharge date: 08/21/21  Discharge Physician: Loletha Grayer   PCP: Langston Reusing, NP   Recommendations at discharge:   Follow-up open-door clinic Referral to hematology  Discharge Diagnoses: Principal Problem:   CVA (cerebral vascular accident) [facility]) Active Problems:   Hypertension   CKD (chronic kidney disease), stage IIIb (HCC)   Tobacco abuse   COPD (chronic obstructive pulmonary disease) ([place])   Elevated troponin   Hospital Course: The patient was admitted to the hospital on 08/19/2021 and discharged on 08/21/2021.  Family member called 911 because she did not look right.  Patient did complain of some weakness.  MRI showing a probable subacute parietal infarct with evidence of cortical laminar necrosis.  MRA angio of the head and neck did not show any significant stenosis.  Telemetry monitoring did not show any arrhythmia.  Echocardiogram did not show any signs of stroke or shunt.  EF normal at 60 to 65%.  Moderate aortic regurgitation seen on echocardiogram.  Patient was set up with a Zio patch upon discharge to see if any heart arrhythmias.  Factor V Leiden pending.  LDL of 78.  The patient has chronic right-sided weakness secondary to prior stroke.  Patient did well with physical therapy and recommended no PT follow-up.  Assessment and Plan: * CVA (cerebral vascular accident) [facility]) Patient with a prior history of CVA with mild right-sided hemiparesis who presents for evaluation of bilateral lower extremity weakness and is found to have a subacute parietal infarct with evidence of cortical laminar necrosis. Multiple remote infarcts and chronic microvascular ischemic disease.  Case discussed with neurology and recommended continuing dual antiplatelet therapy with aspirin and Plavix.  We were able to set up with a Zio patch upon discharge to see if any heart  arrhythmias.  Reviewed prior hypercoagulable work-up and I did not see a factor V Leiden so I ordered that and that result is pending.  Neurology recommended high-dose atorvastatin and follow-up with hematology as outpatient.  LDL of 78 with a goal less than 70.  Elevated troponin Likely elevated secondary to stroke.  COPD (chronic obstructive pulmonary disease) ([place]) Patient must stop smoking.  Tobacco abuse Patient needs to stop smoking with her history  CKD (chronic kidney disease), stage IIIb (HCC) Creatinine 1.57 with a GFR 40  Hypertension Can go back on her clonidine and Lasix as outpatient.  Continue to hold Norvasc.         Consultants: Neurology Procedures performed: None Disposition: Home Diet recommendation:  Cardiac diet DISCHARGE MEDICATION: Allergies as of 08/21/2021       Reactions   Coconut (cocos Nucifera) Hives, Swelling   Just coconut ("all coconut")   Amoxicillin Other (See Comments)   "makes me sick" "swelled up everywhere" Has patient had a PCN reaction causing immediate rash, facial/tongue/throat swelling, SOB or lightheadedness with hypotension: No Has patient had a PCN reaction causing severe rash involving mucus membranes or skin necrosis: No Has patient had a PCN reaction that required hospitalization: No Has patient had a PCN reaction occurring within the last 10 years: No If all of the above answers are "NO", then may proceed with Cephalosporin use.   Penicillins Other (See Comments)   "makes me sick" "swelled up oil" Has patient had a PCN reaction causing immediate rash, facial/tongue/throat swelling, SOB or lightheadedness with hypotension: No Has patient had a PCN reaction causing severe rash  involving mucus membranes or skin necrosis: No Has patient had a PCN reaction that required hospitalization: No Has patient had a PCN reaction occurring within the last 10 years: Yes If all of the above answers are "NO", then may proceed with  Cephalosporin use.   Singulair [montelukast Sodium] Rash   "made heart race"        Medication List     STOP taking these medications    amLODipine 10 MG tablet Commonly known as: NORVASC       TAKE these medications    acetaminophen 500 MG tablet Commonly known as: TYLENOL Take 500 mg by mouth every 6 (six) hours as needed for fever or mild pain.   albuterol 108 (90 Base) MCG/ACT inhaler Commonly known as: Proventil HFA Inhale 2 puffs into the lungs once every 4 (four) hours as needed for wheezing (cough).   allopurinol 100 MG tablet Commonly known as: ZYLOPRIM TAKE ONE TABLET BY MOUTH ONCE DAILY.   aspirin EC 81 MG tablet Take 1 tablet (81 mg total) by mouth once daily. Swallow whole.   atorvastatin 80 MG tablet Commonly known as: LIPITOR Take 1 tablet (80 mg total) by mouth daily. Start taking on: August 22, 2021 What changed:  medication strength how much to take   benzonatate 100 MG capsule Commonly known as: Tessalon Perles Take 1 capsule (100 mg total) by mouth 3 (three) times daily as needed.   cetirizine 10 MG tablet Commonly known as: ZYRTEC TAKE ONE TABLET BY MOUTH ONCE DAILY.   cloNIDine 0.3 MG tablet Commonly known as: CATAPRES Take 1 tablet by mouth 3 times daily (morning, noon, and evening).   clopidogrel 75 MG tablet Commonly known as: PLAVIX Take 1 tablet (75 mg total) by mouth once daily.   fluticasone 50 MCG/ACT nasal spray Commonly known as: FLONASE SPRAY 1 SPRAY INTO BOTH NOSTRILS 2 TIMES A DAY   fluticasone-salmeterol 250-50 MCG/ACT Aepb Commonly known as: Advair Diskus Inhale 1 puff into the lungs in the morning and at bedtime.   furosemide 40 MG tablet Commonly known as: LASIX Take 40 mg by mouth daily.   lidocaine 5 % ointment Commonly known as: XYLOCAINE Apply 1 application to the affected area(s) topically once daily as needed.   losartan 25 MG tablet Commonly known as: COZAAR Take 1 tablet (25 mg total) by mouth  once daily.   omeprazole 20 MG capsule Commonly known as: PRILOSEC TAKE ONE CAPSULE BY MOUTH ONCE DAILY.   potassium chloride SA 20 MEQ tablet Commonly known as: KLOR-CON M Take 1 tablet (20 mEq total) by mouth once daily as needed (potassium).   promethazine 25 MG tablet Commonly known as: PHENERGAN TAKE 1 TABLET BY MOUTH ONCE EVERY 6 HOURS AS NEEDED FOR NAUSEA OR VOMITING.   traZODone 100 MG tablet Commonly known as: DESYREL Take 100 mg by mouth at bedtime.               Durable Medical Equipment  (From admission, onward)           Start     Ordered   08/20/21 1359  For home use only DME Walker rolling  Once       Question Answer Comment  Walker: With [place] Wheels   Patient needs a walker to treat with the following condition Difficulty walking      08/20/21 1358            [place]  Follow up in 1 week(s).   Specialty: Primary Care Why: Patient to make own follow up appt Contact information: [address] [place] [phone]        Lloyd Huger, MD Follow up on 09/09/2021.   Specialty: Oncology Why: @ 10:30am Contact information: [place] [address] [phone]                Discharge Exam: Physical Exam HENT:     Head: Normocephalic.     Mouth/Throat:     Pharynx: No oropharyngeal exudate.  Eyes:     General: Lids are normal.     Conjunctiva/sclera: Conjunctivae normal.  Cardiovascular:     Rate and Rhythm: Normal rate and regular rhythm.     Heart sounds: S1 normal and S2 normal. Murmur heard.     Systolic murmur is present with a grade of 2/6.  Pulmonary:     Breath sounds: No decreased breath sounds, wheezing, rhonchi or rales.  Abdominal:     Palpations: Abdomen is soft.     Tenderness: There is no abdominal tenderness.  Musculoskeletal:     Right lower leg: No swelling.     Left lower leg: No swelling.   Skin:    General: Skin is warm.     Findings: No rash.  Neurological:     Mental Status: She is alert and oriented to person, place, and time.     Comments: Chronic right-sided weakness secondary to prior stroke.  Power 4 out of 5 right upper and lower extremity.  5 out of 5 left upper and lower extremity.      Condition at discharge: stable  The results of significant diagnostics from this hospitalization (including imaging, microbiology, ancillary and laboratory) are listed below for reference.   Imaging Studies: ECHOCARDIOGRAM COMPLETE BUBBLE STUDY  Result Date: 08/21/2021    ECHOCARDIOGRAM REPORT   Patient Name:   [name] Date of Exam: 08/20/2021 Medical Rec #:  [id number]      Height:       61.0 in Accession #:    [id number]     Weight:       160.3 lb Date of Birth:  Aug 08, 1970     BSA:          1.719 m Patient Age:    51 years       BP:           170/53 mmHg Patient Gender: F              HR:           80 bpm. Exam Location:  [facility] Procedure: 2D Echo, Color Doppler, Cardiac Doppler and Saline Contrast Bubble            Study Indications:     I63.9 Stroke  History:         Patient has prior history of Echocardiogram examinations, most                  recent 07/22/2020. Previous Myocardial Infarction, Stroke; Risk                  Factors:Hypertension.  Sonographer:     Charmayne Sheer Referring Phys:  [place] Diagnosing Phys: Ida Rogue MD  Sonographer Comments: Suboptimal parasternal window and suboptimal subcostal window. IMPRESSIONS  1. Agitated saline contrast bubble study was negative, with no evidence of any interatrial shunt.  2. Left ventricular  ejection fraction, by estimation, is 60 to 65%. The left ventricle has normal function. The left ventricle has no regional wall motion abnormalities. There is mild left ventricular hypertrophy. Left ventricular diastolic parameters are consistent with Grade I diastolic dysfunction (impaired relaxation).  3. Right ventricular  systolic function is normal. The right ventricular size is normal. Tricuspid regurgitation signal is inadequate for assessing PA pressure.  4. Left atrial size was mildly dilated.  5. The mitral valve is normal in structure. No evidence of mitral valve regurgitation. No evidence of mitral stenosis.  6. The aortic valve has an indeterminant number of cusps. Aortic valve regurgitation is moderate. Aortic valve sclerosis is present, with no evidence of aortic valve stenosis.  7. The inferior vena cava is normal in size with greater than 50% respiratory variability, suggesting right atrial pressure of 3 mmHg. FINDINGS  Left Ventricle: Left ventricular ejection fraction, by estimation, is 60 to 65%. The left ventricle has normal function. The left ventricle has no regional wall motion abnormalities. The left ventricular internal cavity size was normal in size. There is  mild left ventricular hypertrophy. Left ventricular diastolic parameters are consistent with Grade I diastolic dysfunction (impaired relaxation). Right Ventricle: The right ventricular size is normal. No increase in right ventricular wall thickness. Right ventricular systolic function is normal. Tricuspid regurgitation signal is inadequate for assessing PA pressure. Left Atrium: Left atrial size was mildly dilated. Right Atrium: Right atrial size was normal in size. Pericardium: There is no evidence of pericardial effusion. Mitral Valve: The mitral valve is normal in structure. No evidence of mitral valve regurgitation. No evidence of mitral valve stenosis. Tricuspid Valve: The tricuspid valve is normal in structure. Tricuspid valve regurgitation is not demonstrated. No evidence of tricuspid stenosis. Aortic Valve: The aortic valve has an indeterminant number of cusps. Aortic valve regurgitation is moderate. Aortic regurgitation PHT measures 405 msec. Aortic valve sclerosis is present, with no evidence of aortic valve stenosis. Aortic valve mean gradient  measures 5.0 mmHg. Aortic valve peak gradient measures 8.9 mmHg. Aortic valve area, by VTI measures 4.47 cm. Pulmonic Valve: The pulmonic valve was normal in structure. Pulmonic valve regurgitation is not visualized. No evidence of pulmonic stenosis. Aorta: The aortic root is normal in size and structure. Venous: The inferior vena cava is normal in size with greater than 50% respiratory variability, suggesting right atrial pressure of 3 mmHg. IAS/Shunts: No atrial level shunt detected by color flow Doppler. Agitated saline contrast was given intravenously to evaluate for intracardiac shunting. Agitated saline contrast bubble study was negative, with no evidence of any interatrial shunt.  LEFT VENTRICLE PLAX 2D LVIDd:         4.71 cm      Diastology LVIDs:         3.35 cm      LV e' medial:    6.42 cm/s LV PW:         1.09 cm      LV E/e' medial:  13.5 LV IVS:        1.17 cm      LV e' lateral:   7.72 cm/s LVOT diam:     2.30 cm      LV E/e' lateral: 11.3 LV SV:         112 LV SV Index:   65 LVOT Area:     4.15 cm  LV Volumes (MOD) LV vol d, MOD A2C: 126.0 ml LV vol d, MOD A4C: 136.0 ml LV vol s, MOD A2C:  46.3 ml LV vol s, MOD A4C: 46.9 ml LV SV MOD A2C:     79.7 ml LV SV MOD A4C:     136.0 ml LV SV MOD BP:      94.5 ml RIGHT VENTRICLE RV Basal diam:  2.63 cm LEFT ATRIUM             Index LA diam:        4.30 cm 2.50 cm/m LA Vol (A2C):   34.3 ml 19.95 ml/m LA Vol (A4C):   30.0 ml 17.45 ml/m LA Biplane Vol: 32.1 ml 18.67 ml/m  AORTIC VALVE                     PULMONIC VALVE AV Area (Vmax):    4.04 cm      PV Vmax:       1.75 m/s AV Area (Vmean):   3.96 cm      PV Peak grad:  12.2 mmHg AV Area (VTI):     4.47 cm AV Vmax:           149.00 cm/s AV Vmean:          100.000 cm/s AV VTI:            0.251 m AV Peak Grad:      8.9 mmHg AV Mean Grad:      5.0 mmHg LVOT Vmax:         145.00 cm/s LVOT Vmean:        95.400 cm/s LVOT VTI:          0.270 m LVOT/AV VTI ratio: 1.08 AI PHT:            405 msec  AORTA Ao Root  diam: 3.00 cm MITRAL VALVE MV Area (PHT): 4.63 cm    SHUNTS MV Decel Time: 164 msec    Systemic VTI:  0.27 m MV E velocity: 86.90 cm/s  Systemic Diam: 2.30 cm MV A velocity: 87.50 cm/s MV E/A ratio:  0.99 Ida Rogue MD Electronically signed by Ida Rogue MD Signature Date/Time: 08/21/2021/10:45:28 AM    Final    MR ANGIO HEAD WO CONTRAST  Result Date: 08/20/2021 CLINICAL DATA:  Acute neurologic deficit EXAM: MRA HEAD WITHOUT CONTRAST TECHNIQUE: Angiographic images of the Circle of Willis were acquired using MRA technique without intravenous contrast. COMPARISON:  None Available. FINDINGS: POSTERIOR CIRCULATION: --Vertebral arteries: Normal --Inferior cerebellar arteries: Normal. --Basilar artery: Normal. --Superior cerebellar arteries: Normal. --Posterior cerebral arteries: Normal. ANTERIOR CIRCULATION: --Intracranial internal carotid arteries: Normal. --Anterior cerebral arteries (ACA): Normal. --Middle cerebral arteries (MCA): Normal. ANATOMIC VARIANTS: None IMPRESSION: Normal intracranial MRA. Electronically Signed   By: Ulyses Jarred M.D.   On: 08/20/2021 01:53   MR ANGIO NECK WO CONTRAST  Result Date: 08/20/2021 CLINICAL DATA:  Acute neurologic deficits EXAM: MRA NECK WITHOUT CONTRAST TECHNIQUE: Angiographic images of the neck were acquired using MRA technique without intravenous contrast. Carotid stenosis measurements (when applicable) are obtained utilizing NASCET criteria, using the distal internal carotid diameter as the denominator. COMPARISON:  None Available. FINDINGS: Aortic arch: Normal branching pattern.  Normal. Right carotid system: No stenosis or occlusion. Left carotid system: No stenosis or occlusion. Vertebral arteries: Codominant system.  Normal. Other: None. IMPRESSION: Normal MRA of the neck. Electronically Signed   By: Ulyses Jarred M.D.   On: 08/20/2021 01:04   MR BRAIN WO CONTRAST  Result Date: 08/19/2021 CLINICAL DATA:  Neuro deficit, acute, stroke suspected EXAM: MRI  HEAD WITHOUT CONTRAST TECHNIQUE: Multiplanar, multiecho  pulse sequences of the brain and surrounding structures were obtained without intravenous contrast. COMPARISON:  Same day CT head.  MRI head 07/22/2020. FINDINGS: Brain: Findings on same day CT head correlates with a parietal infarct which demonstrates mild peripheral restricted diffusion, mild FLAIR hyperintensity and curvilinear T1 hyperintensity, compatible with probably subacute infarct with laminar necrosis. Additional remote left occipital infarct with encephalomalacia and gliosis. Small remote cortical infarct in the left frontal lobe. Small remote lacunar infarcts in the cerebellum. Scattered T2/FLAIR hyperintensities in the white matter, nonspecific but compatible with chronic microvascular ischemic disease. No evidence of acute infarct, acute hemorrhage, mass lesion, midline shift or hydrocephalus. Vascular: Major arterial flow voids are maintained at the skull base. Skull and upper cervical spine: Normal marrow signal. Sinuses/Orbits: Clear sinuses.  No acute orbital findings. Other: No mastoid effusions. IMPRESSION: 1. Findings on same day CT head correlates with a probably subacute parietal infarct with evidence of cortical laminar necrosis. 2. Multiple remote infarcts and chronic microvascular ischemic disease, described above. Electronically Signed   By: Margaretha Sheffield M.D.   On: 08/19/2021 15:23   CT Head Wo Contrast  Result Date: 08/19/2021 CLINICAL DATA:  Syncope/presyncope, cerebrovascular cause suspected EXAM: CT HEAD WITHOUT CONTRAST TECHNIQUE: Contiguous axial images were obtained from the base of the skull through the vertex without intravenous contrast. RADIATION DOSE REDUCTION: This exam was performed according to the departmental dose-optimization program which includes automated exposure control, adjustment of the mA and/or kV according to patient size and/or use of iterative reconstruction technique. COMPARISON:  CT head  07/21/2020. FINDINGS: Brain: New hypoattenuation and loss of gray white differentiation in the high right frontoparietal region, concerning for acute or subacute infarct. Possible slight petechial hemorrhage. No mass occupying acute hemorrhage.No evidence of hydrocephalus, extra-axial collection or mass lesion/mass effect. Remote left occipital infarct, similar. Vascular: No hyperdense vessel identified. Calcific intracranial atherosclerosis. Skull: No acute fracture. Sinuses/Orbits: Clear sinuses.  No acute orbital findings. Other: No mastoid effusions. IMPRESSION: 1. New hypoattenuation and loss of gray white differentiation in the high right frontoparietal region, concerning for acute or subacute infarct. Possible slight petechial hemorrhage. No mass occupying acute hemorrhage. An MRI could further evaluate if clinically warranted. 2. Remote left occipital infarct. Electronically Signed   By: Margaretha Sheffield M.D.   On: 08/19/2021 13:32    Microbiology: Results for orders placed or performed during the hospital encounter of 08/19/21  Resp Panel by RT-PCR (Flu A&B, Covid) Anterior Nasal Swab     Status: None   Collection Time: 08/19/21  1:03 PM   Specimen: Anterior Nasal Swab  Result Value Ref Range Status   SARS Coronavirus 2 by RT PCR NEGATIVE NEGATIVE Final    Comment: (NOTE) SARS-CoV-2 target nucleic acids are NOT DETECTED.  The SARS-CoV-2 RNA is generally detectable in upper respiratory specimens during the acute phase of infection. The lowest concentration of SARS-CoV-2 viral copies this assay can detect is 138 copies/mL. A negative result does not preclude SARS-Cov-2 infection and should not be used as the sole basis for treatment or other patient management decisions. A negative result may occur with  improper specimen collection/handling, submission of specimen other than nasopharyngeal swab, presence of viral mutation(s) within the areas targeted by this assay, and inadequate number  of viral copies(<138 copies/mL). A negative result must be combined with clinical observations, patient history, and epidemiological information. The expected result is Negative.  Fact Sheet for Patients:  EntrepreneurPulse.com.au  Fact Sheet for Healthcare Providers:  IncredibleEmployment.be  This test is no  t yet approved or cleared by the [place] and  has been authorized for detection and/or diagnosis of SARS-CoV-2 by FDA under an Emergency Use Authorization (EUA). This EUA will remain  in effect (meaning this test can be used) for the duration of the COVID-19 declaration under Section 564(b)(1) of the Act, 21 U.S.C.section 360bbb-3(b)(1), unless the authorization is terminated  or revoked sooner.       Influenza A by PCR NEGATIVE NEGATIVE Final   Influenza B by PCR NEGATIVE NEGATIVE Final    Comment: (NOTE) The Xpert Xpress SARS-CoV-2/FLU/RSV plus assay is intended as an aid in the diagnosis of influenza from Nasopharyngeal swab specimens and should not be used as a sole basis for treatment. Nasal washings and aspirates are unacceptable for Xpert Xpress SARS-CoV-2/FLU/RSV testing.  Fact Sheet for Patients: EntrepreneurPulse.com.au  Fact Sheet for Healthcare Providers: IncredibleEmployment.be  This test is not yet approved or cleared by the [place] FDA and has been authorized for detection and/or diagnosis of SARS-CoV-2 by FDA under an Emergency Use Authorization (EUA). This EUA will remain in effect (meaning this test can be used) for the duration of the COVID-19 declaration under Section 564(b)(1) of the Act, 21 U.S.C. section 360bbb-3(b)(1), unless the authorization is terminated or revoked.  Performed at [facility], [address] [place], [place] [address]     Labs: CBC: Recent Labs  Lab 08/19/21 1214  WBC 8.2  HGB 12.9  HCT 38.0  MCV 96.9  PLT 681    Basic Metabolic Panel: Recent Labs  Lab 08/19/21 1214  NA 139  K 3.7  CL 111  CO2 20*  GLUCOSE 95  BUN 22*  CREATININE 1.57*  CALCIUM 9.0    CBG: Recent Labs  Lab 08/19/21 1409  GLUCAP 100*    Discharge time spent: greater than 30 minutes.  Signed: Loletha Grayer, MD [facility] 08/21/2021

## 2021-08-21 NOTE — Progress Notes (Addendum)
CSW spoke with Georges Lynch with [employer] at [phone] she reports that the [employer] patch needs to be placed by respiratory tech. She reports that Elwyn Reach will call the patient within 24-48 hours to set up the financial assistance plan. They have zero percent down and generous financial assistance for patients without insurance. Patient will need to answer phone call when they reach out.   Patient also received RW delivered to room via [place].   [place], [place] Sheily Lineman

## 2021-08-21 NOTE — Consult Note (Addendum)
NEUROLOGY CONSULTATION NOTE   Date of service: August 21, 2021 Patient Name: Gabrielle Schultz MRN:  [id number] DOB:  [birth year] Reason for consult: subacute ischemic stroke Requesting physician: Dr. Loletha Grayer _ _ _   _ __   _ __ _ _  __ __   _ __   __ _  History of Present Illness   This is a 51 year old woman with past medical history significant for CKD, syncope, hypertension, prior MI, migraines, tobacco use disorder, multiple prior ischemic strokes who presented after an episode of both legs feeling transiently "like jelly" that has now resolved.  Patient states that on the day of admission she woke up between 5 and 6 AM and made her husband breakfast.  She took her blood pressure medications went back to bed and woke up at 11 AM.  She felt dizzy when she woke woke up and felt that both legs were weak.  She did not have syncope.  On EMS arrival patient had no neurologic deficits but due to dizziness was taken to the ED.  MRI brain showed probable subacute parietal ischemic infarct with evidence of cortical laminar necrosis.  MRI also showed evidence of remote ischemic infarcts in the left occipital lobe, left frontal lobe, cerebellum, superimposed on chronic microvascular ischemic disease. CNS imaging personally reviewed. Patient feels that she is back to her recent baseline with no new neurologic sx.  Stroke workup this admission:  MRA H&N wo contrast (2/2 CKD): WNL (personal review)  TTE: neg bubble, normal LVEF, grade I diastolic dysfunction, no intracardiac clot  Previous TTE and TEE in 2021 and 2022 showed no e/o PFO  Factor V Leiden pending, see below for results of previous hypercoag workup  Stroke Labs     Component Value Date/Time   CHOL 123 08/20/2021 0512   CHOL 188 01/10/2020 1913   CHOL 123 10/20/2012 0226   TRIG 128 08/20/2021 0512   TRIG 85 10/20/2012 0226   HDL 19 (L) 08/20/2021 0512   HDL 34 (L) 01/10/2020 1913   HDL 20 (L) 10/20/2012 0226   CHOLHDL 6.5  08/20/2021 0512   VLDL 26 08/20/2021 0512   VLDL 17 10/20/2012 0226   LDLCALC 78 08/20/2021 0512   LDLCALC 126 (H) 01/10/2020 1913   LDLCALC 86 10/20/2012 0226   LABVLDL 28 01/10/2020 1913    Lab Results  Component Value Date/Time   HGBA1C 5.7 (H) 05/21/2021 07:32 PM    She is f/b Dr. Tomi Likens with outpatient neurology. Per excellent summary in his last outpatient note dated 01/16/21 her stroke hx is as follows:  "She had a stroke at age 63.  She doesn't remember her symptoms.   She had a second stroke in addition to an acute ischemic colitis in 2014.  She does not remember her symptoms but records report right homonymous hemianopsia and right arm and leg weakness and numbness of 2 months duration.   MRI of brain without contrast from 12/29/2012 demonstrated "[R]emote moderate size left occipital/posterior inferior left temporal lobe infarct with small area of involvement of the inferior left parietal lobe.  Associated encephalomalacia and laminar necrosis.  No evidence of acute infarct.  Remote small left frontal lobe infarct.  Mild to moderate nonspecific white matter type changes.Marland KitchenMarland KitchenGlobal atrophy without hydrocephalus."  She reports that no cause for her strokes were ever found.  Following her stroke, she never underwent PT/OT because she had no insurance.  Since the second stroke, she continues to have aching and cramping of her  hand down to her elbow with use of her hand.  She also has numbness involving the first 3 digits of her right hand.  She has history of carpal tunnel syndrome diagnosed many years ago, so she tried wearing a brace.  NCV-EMG of right upper extremity on 11/23/2018 was normal.   She was admitted to [facility] in November 2021 for a third stroke presenting with sudden left sided weakness.  Blood pressure was in the 188C systolic.  CT head was negative for bleed.  She received IV tPA.  MRI of brain showed small acute posterior right frontal lobe infarct.  2D echo  showed EF 65-70% with no source of embolus.  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  Blood cultures were negative.  [employer] Virus 2 negative, LDL 97, and Hgb A1c 5.7.  Hypercoagulable panel showed homocysteine 52 (thought to be secondary to smoking), antithrombin III 121, protein C 109, B2GPI negative, lupus anticoagulant panel negative and cardiolipin negative.  She was discharged on ASA and Plavix 75mg  daily for 3 weeks followed by Plavix alone.  Zocor was increased to 40mg  daily, however her PCP reduced dose due to her kidney disease.    On 07/21/2020, she developed sudden onset right sided weakness and aphasia lasting 20 minutes.  She went to the ED where CT and MRI of brain showed chronic cortical infarcts, primarily left occipital lobe, but no acute stroke.  MRA of head limited due to motion but no large vessel occlusion. Carotid ultrasound negative for hemodynamically significant stenosis.  Echocardiogram showed EF 65-70% with negative bubble study.  LDL was 119 and Hgb A1c was 5.9.  She was found to be positive for COVID.  Discharged on ASA and Plavix for 3 weeks followed by Plavix alone.  Simvastatin was changed to atorvastatin 40mg ."   ROS   Per HPI: all other systems reviewed and are negative  Past History   I have reviewed the following:  Past Medical History:  Diagnosis Date   Acid reflux    Allergic rhinitis 06/26/2015   Allergy    Asthma    Carpal tunnel syndrome    right hand   Chronic kidney disease    Colitis    CVA (cerebral infarction)    2009, 2015   Gout    History of syncope    Hypertension 2008   Low HDL (under 40) 06/26/2015   MI (myocardial infarction) ([place])    2015 - patient wasnt told by cardiologist that she had MI but has had cardiac cath in past   Migraines    Poor circulation    Renal insufficiency    Stroke [facility])     Syncope and collapse    Past Surgical History:  Procedure Laterality Date   BUBBLE STUDY  12/03/2019   Procedure: BUBBLE STUDY;  Surgeon: Geralynn Rile, MD;  Location: [place];  Service: Cardiovascular;;   CARDIAC CATHETERIZATION     CESAREAN SECTION  1994   Dr. Ammie Dalton   COLONOSCOPY WITH PROPOFOL N/A 06/26/2014   Procedure: COLONOSCOPY WITH PROPOFOL;  Surgeon: Josefine Class, MD;  Location: [facility] ENDOSCOPY;  Service: Endoscopy;  Laterality: N/A;   DILATION AND CURETTAGE OF UTERUS  x2 for incomplete SABs   TEE WITHOUT CARDIOVERSION N/A 12/03/2019   Procedure: TRANSESOPHAGEAL ECHOCARDIOGRAM (TEE);  Surgeon: Geralynn Rile, MD;  Location: [place];  Service:  Cardiovascular;  Laterality: N/A;   Family History  Problem Relation Age of Onset   Hyperlipidemia Mother    Heart disease Mother        has a defibrillator   COPD Mother    Hypertension Father    Heart disease Father    Hyperlipidemia Father    Hypertension Sister        died in her 3s from heart failure and end stage kidney disease   Kidney disease Sister    Hypertension Brother    Heart disease Brother        has a defibrillator   Coronary artery disease Brother        had CABG   Hyperlipidemia Brother    Healthy Child    Diabetes Child    Social History   Socioeconomic History   Marital status: Married    Spouse name: Not on file   Number of children: 2   Years of education: 11th grade   Highest education level: 11th grade  Occupational History   Occupation: unemployed  Tobacco Use   Smoking status: Every Day    Packs/day: 0.25    Years: 33.00    Total pack years: 8.25    Types: Cigarettes    Start date: 03/04/2017   Smokeless tobacco: Never  Vaping Use   Vaping Use: Never used  Substance and Sexual Activity   Alcohol use: Yes    Comment: rare- drinks an occassional wine cooler   Drug use: No   Sexual activity: Yes    Partners: Male    Birth control/protection: None   Other Topics Concern   Not on file  Social History Narrative   Husband is a Administrator for [employer] and is the sole provider for the family including 2 children (86 yr old and 55 yr old)    Goes to food bank for food. Says "really worried" about water and light bill. Husband, baby, 73 year old, her, and mother all live together.    Patient would like appointment with [facility].       Pt is right handed   Social Determinants of Health   Financial Resource Strain: High Risk (08/02/2017)   Overall Financial Resource Strain (CARDIA)    Difficulty of Paying Living Expenses: Hard  Food Insecurity: Food Insecurity Present (05/21/2021)   Hunger Vital Sign    Worried About Running Out of Food in the Last Year: Sometimes true    Ran Out of Food in the Last Year: Sometimes true  Transportation Needs: No Transportation Needs (05/21/2021)   PRAPARE - Hydrologist (Medical): No    Lack of Transportation (Non-Medical): No  Physical Activity: Insufficiently Active (07/25/2018)   Exercise Vital Sign    Days of Exercise per Week: 2 days    Minutes of Exercise per Session: 60 min  Stress: Stress Concern Present (08/02/2017)   [place]    Feeling of Stress : Very much  Social Connections: Somewhat Isolated (08/02/2017)   Social Connection and Isolation Panel [NHANES]    Frequency of Communication with Friends and Family: More than three times a week    Frequency of Social Gatherings with Friends and Family: Never    Attends Religious Services: Never    Marine scientist or Organizations: No    Attends Archivist Meetings: Never    Marital Status: Married   Allergies  Allergen Reactions  Coconut (Cocos Nucifera) Hives and Swelling    Just coconut ("all coconut")   Amoxicillin Other (See Comments)    "makes me sick" "swelled up everywhere" Has patient had a PCN reaction causing  immediate rash, facial/tongue/throat swelling, SOB or lightheadedness with hypotension: No Has patient had a PCN reaction causing severe rash involving mucus membranes or skin necrosis: No Has patient had a PCN reaction that required hospitalization: No Has patient had a PCN reaction occurring within the last 10 years: No If all of the above answers are "NO", then may proceed with Cephalosporin use.     Penicillins Other (See Comments)    "makes me sick" "swelled up oil" Has patient had a PCN reaction causing immediate rash, facial/tongue/throat swelling, SOB or lightheadedness with hypotension: No Has patient had a PCN reaction causing severe rash involving mucus membranes or skin necrosis: No Has patient had a PCN reaction that required hospitalization: No Has patient had a PCN reaction occurring within the last 10 years: Yes If all of the above answers are "NO", then may proceed with Cephalosporin use.    Singulair [Montelukast Sodium] Rash    "made heart race"    Medications   Medications Prior to Admission  Medication Sig Dispense Refill Last Dose   allopurinol (ZYLOPRIM) 100 MG tablet TAKE ONE TABLET BY MOUTH ONCE DAILY. 30 tablet 11 08/19/2021 at 0700   amLODipine (NORVASC) 10 MG tablet TAKE ONE TABLET BY MOUTH ONCE DAILY. 90 tablet 0 08/19/2021 at 0700   aspirin EC 81 MG tablet Take 1 tablet (81 mg total) by mouth once daily. Swallow whole. 90 tablet 0 08/19/2021 at 0700   atorvastatin (LIPITOR) 40 MG tablet Take 1 tablet (40 mg total) by mouth once daily. 90 tablet 0 08/19/2021 at 0700   benzonatate (TESSALON PERLES) 100 MG capsule Take 1 capsule (100 mg total) by mouth 3 (three) times daily as needed. 20 capsule 0 08/19/2021   cloNIDine (CATAPRES) 0.3 MG tablet Take 1 tablet by mouth 3 times daily (morning, noon, and evening). 90 tablet 11 08/19/2021 at 0700   clopidogrel (PLAVIX) 75 MG tablet Take 1 tablet (75 mg total) by mouth once daily. 90 tablet 1 08/19/2021 at 0700    fluticasone (FLONASE) 50 MCG/ACT nasal spray SPRAY 1 SPRAY INTO BOTH NOSTRILS 2 TIMES A DAY 16 g 0 08/19/2021   fluticasone-salmeterol (ADVAIR DISKUS) 250-50 MCG/ACT AEPB Inhale 1 puff into the lungs in the morning and at bedtime. 60 each 2 08/19/2021   furosemide (LASIX) 40 MG tablet Take 40 mg by mouth daily.   08/19/2021 at 0700   losartan (COZAAR) 25 MG tablet Take 1 tablet (25 mg total) by mouth once daily. 30 tablet 3 08/19/2021 at 0700   omeprazole (PRILOSEC) 20 MG capsule TAKE ONE CAPSULE BY MOUTH ONCE DAILY. 90 capsule 0 08/19/2021 at 0700   acetaminophen (TYLENOL) 500 MG tablet Take 500 mg by mouth every 6 (six) hours as needed for fever or mild pain.   prn at prn   albuterol (PROVENTIL HFA) 108 (90 Base) MCG/ACT inhaler Inhale 2 puffs into the lungs once every 4 (four) hours as needed for wheezing (cough). 6.7 g 2 prn at prn   cetirizine (ZYRTEC) 10 MG tablet TAKE ONE TABLET BY MOUTH ONCE DAILY. 90 tablet 1 prn at prn   lidocaine (XYLOCAINE) 5 % ointment Apply 1 application to the affected area(s) topically once daily as needed. 35.44 g 0 prn at prn   potassium chloride SA (KLOR-CON) 20 MEQ tablet  Take 1 tablet (20 mEq total) by mouth once daily as needed (potassium). 45 tablet 0 prn at prn   promethazine (PHENERGAN) 25 MG tablet TAKE 1 TABLET BY MOUTH ONCE EVERY 6 HOURS AS NEEDED FOR NAUSEA OR VOMITING. 15 tablet 0 prn at prn   traZODone (DESYREL) 100 MG tablet Take 100 mg by mouth at bedtime.   prn at prn      Current Facility-Administered Medications:    [DISCONTINUED] acetaminophen (TYLENOL) tablet 650 mg, 650 mg, Oral, Q4H PRN **OR** [DISCONTINUED] acetaminophen (TYLENOL) 160 MG/5ML solution 650 mg, 650 mg, Per Tube, Q4H PRN **OR** acetaminophen (TYLENOL) suppository 650 mg, 650 mg, Rectal, Q4H PRN, Agbata, Tochukwu, MD   acetaminophen (TYLENOL) tablet 500 mg, 500 mg, Oral, Q6H PRN, Agbata, Tochukwu, MD, 500 mg at 08/20/21 0758   albuterol (PROVENTIL) (2.5 MG/3ML) 0.083% nebulizer  solution 3 mL, 3 mL, Inhalation, Q4H PRN, Agbata, Tochukwu, MD   allopurinol (ZYLOPRIM) tablet 100 mg, 100 mg, Oral, Daily, Agbata, Tochukwu, MD, 100 mg at 08/21/21 3810   aspirin EC tablet 81 mg, 81 mg, Oral, Daily, Agbata, Tochukwu, MD, 81 mg at 08/21/21 0829   atorvastatin (LIPITOR) tablet 80 mg, 80 mg, Oral, Daily, Agbata, Tochukwu, MD, 80 mg at 08/21/21 0829   cloNIDine (CATAPRES) tablet 0.3 mg, 0.3 mg, Oral, TID, Leslye Peer, Richard, MD, 0.3 mg at 08/21/21 1751   clopidogrel (PLAVIX) tablet 75 mg, 75 mg, Oral, Daily, Agbata, Tochukwu, MD, 75 mg at 08/21/21 0829   guaiFENesin (MUCINEX) 12 hr tablet 1,200 mg, 1,200 mg, Oral, BID, Agbata, Tochukwu, MD, 1,200 mg at 08/21/21 0829   labetalol (NORMODYNE) injection 10 mg, 10 mg, Intravenous, Q6H PRN, Agbata, Tochukwu, MD   loratadine (CLARITIN) tablet 10 mg, 10 mg, Oral, Daily, Agbata, Tochukwu, MD, 10 mg at 08/21/21 0828   mometasone-formoterol (DULERA) 200-5 MCG/ACT inhaler 2 puff, 2 puff, Inhalation, BID, Agbata, Tochukwu, MD, 2 puff at 08/21/21 0829   pantoprazole (PROTONIX) EC tablet 40 mg, 40 mg, Oral, Daily, Agbata, Tochukwu, MD, 40 mg at 08/21/21 0829  Vitals   Vitals:   08/20/21 2200 08/21/21 0035 08/21/21 0531 08/21/21 0835  BP: (!) 110/93 (!) 141/59 (!) 152/52 (!) 162/52  Pulse: 65 (!) 56 (!) 53 (!) 53  Resp: 16 16 14 17   Temp: 98.7 F (37.1 C) 98 F (36.7 C) 98.7 F (37.1 C) 97.7 F (36.5 C)  TempSrc: Oral     SpO2: 100% 99% 96% 100%     There is no height or weight on file to calculate BMI.  Physical Exam   Physical Exam Gen: A&O x4, NAD HEENT: Atraumatic, normocephalic;mucous membranes moist; oropharynx clear, tongue without atrophy or fasciculations. Neck: Supple, trachea midline. Resp: CTAB, no w/r/r CV: RRR, no m/g/r; nml S1 and S2. 2+ symmetric peripheral pulses. Abd: soft/NT/ND; nabs x 4 quad Extrem: Nml bulk; no cyanosis, clubbing, or edema.  Neuro: *MS: A&O x4. Follows multi-step commands.  *Speech: fluid,  nondysarthric, able to name and repeat *CN:    I: Deferred   II,III: PERRLA, R lateral hemifield deficit R eye only, optic discs unable to be visualized 2/2 pupillary constriction   III,IV,VI: EOMI w/o nystagmus, no ptosis   V: Sensation intact from V1 to V3 to LT   VII: Eyelid closure was full.  R NLF flattening.   VIII: Hearing intact to voice   IX,X: Voice normal, palate elevates symmetrically    XI: SCM/trap 5/5 bilat   XII: Tongue protrudes midline, no atrophy or fasciculations  *Motor:  Normal bulk.  No tremor, rigidity or bradykinesia. Pronator drift RUE    Strength: Dlt Bic Tri WrE WrF FgS Gr HF KnF KnE PlF DoF    Left 5 5 5 5 5 5 5 5 5 5 5 5     Right 5 5 4+ 5 5 5 5  4+ 5 5 5 5    *Sensory: Intact to light touch, pinprick, temperature vibration throughout. Symmetric. Propioception intact bilat.  No double-simultaneous extinction.  *Coordination:  Dysmetria on FNF bilat *Reflexes:  2+ brisk and symmetric throughout without clonus; toes down-going bilat *Gait: deferred  NIHSS  1a Level of Conscious.: 0 1b LOC Questions: 0 1c LOC Commands: 0 2 Best Gaze: 0 3 Visual: 1 4 Facial Palsy: 1 5a Motor Arm - left: 0 5b Motor Arm - Right: 1 6a Motor Leg - Left: 0 6b Motor Leg - Right: 0 7 Limb Ataxia: 0 8 Sensory: 0 9 Best Language: 0 10 Dysarthria: 0 11 Extinct. and Inatten.: 0  TOTAL: 3   Premorbid mRS = 0   Labs   CBC:  Recent Labs  Lab 08/19/21 1214  WBC 8.2  HGB 12.9  HCT 38.0  MCV 96.9  PLT 825    Basic Metabolic Panel:  Lab Results  Component Value Date   NA 139 08/19/2021   K 3.7 08/19/2021   CO2 20 (L) 08/19/2021   GLUCOSE 95 08/19/2021   BUN 22 (H) 08/19/2021   CREATININE 1.57 (H) 08/19/2021   CALCIUM 9.0 08/19/2021   GFRNONAA 40 (L) 08/19/2021   GFRAA 35 (L) 01/10/2020   Lipid Panel:  Lab Results  Component Value Date   LDLCALC 78 08/20/2021   HgbA1c:  Lab Results  Component Value Date   HGBA1C 5.7 (H) 05/21/2021   Urine Drug  Screen:     Component Value Date/Time   LABOPIA NONE DETECTED 12/01/2019 0134   COCAINSCRNUR NONE DETECTED 12/01/2019 0134   COCAINSCRNUR NEGATIVE 07/19/2011 1605   LABBENZ NONE DETECTED 12/01/2019 0134   AMPHETMU NONE DETECTED 12/01/2019 0134   THCU NONE DETECTED 12/01/2019 0134   LABBARB NONE DETECTED 12/01/2019 0134    Alcohol Level     Component Value Date/Time   ETH <10 11/30/2019 1502     Impression   51 year old woman with past medical history significant for CKD, syncope, hypertension, prior MI, migraines, tobacco use disorder, multiple prior ischemic strokes who presented after an episode of both legs feeling transiently "like jelly" that has now resolved and was found to have incidental asymptomatic subacute R parietal ischemic infarct with laminar necrosis. 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 MRA H&N WNL. Prior hypercoag workup largely unrevealing, see above, repeat factor V Leiden sent and is pending. Because of some inconsistencies in prior results (ie elevated homocysteine, favored to be 2/2 tobacco use) I would recommend referring her to hematology to see if they have any further recommendations for hypercoag workup. She has now had 3 echos in 3 yrs (2 TTE and 1 TEE) all with negative bubble studies and without e/o intracardiac clot. She has dilated LA on TTE this admission and patient states that she has never had ambulatory cardiac monitoring. She previously saw Dr. Nehemiah Massed but was unable to see him after a change in her medicaid. I think ambulatory cardiac monitoring, to be followed by ILR implant if negative, is the most important discharge recommendation given she has not had a thorough workup to r/o pAF and all other etiologies have been extensively worked  up.  Recommendations   - Continue home ASA 81mg  daily + plavix 75mg  daily - Atorvastatin 80mg  daily - Outpatient referral to hematology for further evaluation of possible  hypercoagulability - Ambulatory cardiac monitoring - ideally device placed prior to discharge, if not to be arranged in cardiology clinic shortly thereafter. If negative, she will need ILR placed given occult a fib is the most likely culprit of her recurrent cryptogenic strokes. Will need clinic f/u with cardiology as well (was previously seen by Dr. Nehemiah Massed) - Stroke education - Tobacco cessation - Patient may f/u with established outpatient neurologist Dr. Tomi Likens in 4-6 wks  D/w Dr. Leslye Peer. Neurology to sign off, but please re-engage if additional neurologic concerns arise.  ______________________________________________________________________   Thank you for the opportunity to take part in the care of this patient. If you have any further questions, please contact the neurology consultation attending.  Signed,  Su Monks, MD [facility] [phone]  If 7pm- 7am, please page neurology on call as listed in [place].

## 2021-08-21 NOTE — Progress Notes (Signed)
Physical Therapy Treatment Patient Details Name: Gabrielle Schultz MRN: [id number] DOB: [birth year] Today's Date: 08/21/2021   History of Present Illness Patient is a 51 year old female with medical history significant for some peripheral venous hypertension with stage IIIb chronic kidney disease, history of CVA with right-sided hemiparesis, history of colitis, GERD, nicotine dependence who presents to the emergency room via EMS for evaluation of bilateral LE weakness. Dound to have a subacute parietal infarct with evidence of cortical laminar necrosis and multiple remote infarcts and chronic microvascular ischemic disease.    PT Comments    Patient is agreeable to PT. Her rolling walker was delivered during the session. The patient has improved gait pattern using the rolling walker which was recommended to use with ambulation for safety. She is likely nearing her baseline level of functional mobility.    Recommendations for follow up therapy are one component of a multi-disciplinary discharge planning process, led by the attending physician.  Recommendations may be updated based on patient status, additional functional criteria and insurance authorization.  Follow Up Recommendations  No PT follow up     Assistance Recommended at Discharge PRN  Patient can return home with the following Assist for transportation;Help with stairs or ramp for entrance   Equipment Recommendations  Rolling walker (2 wheels)    Recommendations for Other Services       Precautions / Restrictions Precautions Precautions: Fall Restrictions Weight Bearing Restrictions: No     Mobility  Bed Mobility Overal bed mobility: Modified Independent                  Transfers Overall transfer level: Modified independent                 General transfer comment: patient able to stand from multiple surfaces, Mod I.    Ambulation/Gait Ambulation/Gait assistance: Supervision Gait Distance (Feet):  115 Feet Assistive device: Rolling walker (2 wheels) Gait Pattern/deviations: Decreased stride length, Decreased stance time - right Gait velocity: decreased     General Gait Details: patient ambulated in hallway with rolling walker. reinforcement to keep rolling walker close to base of support. the patient also ambulated ~ 15 ft without rolling walker with decreased step length. recommend to use rolling walker for support with ambulation, especailly with longer distance walking   Stairs             Wheelchair Mobility    Modified Rankin (Stroke Patients Only)       Balance                                            Cognition Arousal/Alertness: Awake/alert Behavior During Therapy: WFL for tasks assessed/performed Overall Cognitive Status: Within Functional Limits for tasks assessed                                          Exercises      General Comments        Pertinent Vitals/Pain Pain Assessment Pain Assessment: No/denies pain    Home Living                          Prior Function            PT Goals (current goals  can now be found in the care plan section) Acute Rehab PT Goals Patient Stated Goal: to go home PT Goal Formulation: With patient Time For Goal Achievement: 09/03/21 Potential to Achieve Goals: Good Progress towards PT goals: Progressing toward goals    Frequency    Min 2X/week      PT Plan Current plan remains appropriate    Co-evaluation              AM-PAC PT "6 Clicks" Mobility   Outcome Measure  Help needed turning from your back to your side while in a flat bed without using bedrails?: None Help needed moving from lying on your back to sitting on the side of a flat bed without using bedrails?: None Help needed moving to and from a bed to a chair (including a wheelchair)?: None Help needed standing up from a chair using your arms (e.g., wheelchair or bedside chair)?:  None Help needed to walk in hospital room?: A Little Help needed climbing 3-5 steps with a railing? : A Little 6 Click Score: 22    End of Session   Activity Tolerance: Patient tolerated treatment well Patient left: in bed (neurologist at bedside)   PT Visit Diagnosis: Unsteadiness on feet (R26.81);Muscle weakness (generalized) (M62.81)     Time: 2585-2778 PT Time Calculation (min) (ACUTE ONLY): 16 min  Charges:  $Gait Training: 8-22 mins                    [name], PT, MPT    [name] 08/21/2021, 12:31 PM

## 2021-08-21 NOTE — Progress Notes (Signed)
PT Cancellation Note  Patient Details Name: LENNI RECKNER MRN: [id number] DOB: 12-Apr-1970   Cancelled Treatment:    Reason Eval/Treat Not Completed:  (Patient declined due to coughing and reports having already been up to the bathroom, washed up, changed clothes. PT will continue with attempts as appropriate.)  [name], PT, MPT  [name] 08/21/2021, 9:58 AM

## 2021-08-27 ENCOUNTER — Ambulatory Visit: Payer: Medicaid Other | Admitting: Gerontology

## 2021-08-27 VITALS — BP 111/71 | HR 72 | Temp 97.5°F | Ht 61.0 in | Wt 161.0 lb

## 2021-08-27 DIAGNOSIS — N3946 Mixed incontinence: Secondary | ICD-10-CM

## 2021-08-27 DIAGNOSIS — R051 Acute cough: Secondary | ICD-10-CM

## 2021-08-27 DIAGNOSIS — N939 Abnormal uterine and vaginal bleeding, unspecified: Secondary | ICD-10-CM

## 2021-08-27 DIAGNOSIS — K219 Gastro-esophageal reflux disease without esophagitis: Secondary | ICD-10-CM

## 2021-08-27 DIAGNOSIS — I639 Cerebral infarction, unspecified: Secondary | ICD-10-CM

## 2021-08-27 LAB — FACTOR 5 LEIDEN

## 2021-08-27 MED ORDER — BENZONATATE 100 MG PO CAPS
100.0000 mg | ORAL_CAPSULE | Freq: Three times a day (TID) | ORAL | 0 refills | Status: DC | PRN
  Filled 2021-08-27: qty 20, 7d supply, fill #0

## 2021-08-27 NOTE — Progress Notes (Signed)
Established Patient Office Visit  Subjective   Patient ID: Gabrielle Schultz, female    DOB: 11/29/1970  Age: 51 y.o. MRN: [id number]  Chief Complaint  Patient presents with   Cerebrovascular Accident    Patient had an ischemic stroke last Wednesday (8/16). Went to hospital until 8/17. Was told to follow up. No right sided peripheral vision.   Vaginal Bleeding    Patient said she hasn't had a menstrual cycle 4.5 years ago, but since she has been coughing a couple days after 8/17 and has had some spotting.    HPI  Gabrielle Schultz is a 51 y.o. female who  has a past medical history of Acid reflux, Allergic rhinitis (06/26/2015), Allergy, Asthma, Carpal tunnel syndrome, Chronic kidney disease, Colitis, CVA (cerebral infarction), Gout, History of syncope, Hypertension (2008), Low HDL (under 40) (06/26/2015), MI (myocardial infarction) ([place]), Migraines, Poor circulation, Renal insufficiency, Stroke ([place]), and Syncope , who presents for follow up after hospital discharge. During her hospitalization she was treated for CVA. Currently, she reports that she's doing well, ambulates with a four prong cane, denies muscle nor motor weakness and paresthesia. she reports  vaginal spoting that started 2 days after been discharged from the hospital, states that her LMP was 4 years ago. She c/o stress incontinence with coughing.  She states that she will follow up with Neurology tomorrow. Overall, she states that she's doing well, no further complaint.  Review of Systems  Constitutional: Negative.   Eyes: Negative.   Respiratory: Negative.    Cardiovascular: Negative.   Skin: Negative.   Neurological: Negative.   Psychiatric/Behavioral: Negative.        Objective:     BP 111/71 (BP Location: Right Arm, Cuff Size: Normal)   Pulse 72   Temp (!) 97.5 F (36.4 C)   Ht 5\' 1"  (1.549 m)   Wt 161 lb (73 kg)   LMP 08/18/2020 (Approximate)   SpO2 96%   BMI 30.42 kg/m  BP Readings from Last 3 Encounters:   08/27/21 111/71  08/21/21 (!) 144/55  08/18/21 134/77   Wt Readings from Last 3 Encounters:  08/27/21 161 lb (73 kg)  08/18/21 160 lb 4.8 oz (72.7 kg)  05/21/21 161 lb (73 kg)      Physical Exam HENT:     Head: Normocephalic and atraumatic.     Mouth/Throat:     Mouth: Mucous membranes are moist.  Eyes:     Extraocular Movements: Extraocular movements intact.     Conjunctiva/sclera: Conjunctivae normal.     Pupils: Pupils are equal, round, and reactive to light.  Cardiovascular:     Rate and Rhythm: Normal rate and regular rhythm.     Pulses: Normal pulses.     Heart sounds: Normal heart sounds.  Pulmonary:     Effort: Pulmonary effort is normal.     Breath sounds: Normal breath sounds.  Musculoskeletal:        General: Normal range of motion.  Skin:    General: Skin is warm.  Neurological:     General: No focal deficit present.     Mental Status: She is alert and oriented to person, place, and time. Mental status is at baseline.     Motor: No weakness.     Gait: Gait normal.  Psychiatric:        Mood and Affect: Mood normal.        Behavior: Behavior normal.        Thought Content: Thought content  normal.        Judgment: Judgment normal.      No results found for any visits on 08/27/21.  Last CBC Lab Results  Component Value Date   WBC 8.2 08/19/2021   HGB 12.9 08/19/2021   HCT 38.0 08/19/2021   MCV 96.9 08/19/2021   MCH 32.9 08/19/2021   RDW 14.7 08/19/2021   PLT 226 41/63/8453   Last metabolic panel Lab Results  Component Value Date   GLUCOSE 95 08/19/2021   NA 139 08/19/2021   K 3.7 08/19/2021   CL 111 08/19/2021   CO2 20 (L) 08/19/2021   BUN 22 (H) 08/19/2021   CREATININE 1.57 (H) 08/19/2021   GFRNONAA 40 (L) 08/19/2021   CALCIUM 9.0 08/19/2021   PHOS 3.7 07/31/2015   PROT 6.3 08/13/2021   ALBUMIN 3.9 08/13/2021   LABGLOB 2.4 08/13/2021   AGRATIO 1.6 08/13/2021   BILITOT 0.2 08/13/2021   ALKPHOS 163 (H) 08/13/2021   AST 9 08/13/2021    ALT 5 08/13/2021   ANIONGAP 8 08/19/2021   Last lipids Lab Results  Component Value Date   CHOL 123 08/20/2021   HDL 19 (L) 08/20/2021   LDLCALC 78 08/20/2021   TRIG 128 08/20/2021   CHOLHDL 6.5 08/20/2021   Last hemoglobin A1c Lab Results  Component Value Date   HGBA1C 5.7 (H) 05/21/2021      The ASCVD Risk score (Arnett DK, et al., 2019) failed to calculate for the following reasons:   The patient has a prior MI or stroke diagnosis    Assessment & Plan:   1. Cerebrovascular accident (CVA), unspecified mechanism ([place]) - She was advised to continue on her hospital discharge instructions. She was advised to go to the ED for worsening symptoms.  2. Urge and stress incontinence - She will follow up with Ob/Gyn for evaluation of possible bladder prolapse. - Ambulatory referral to Obstetrics / Gynecology  3. Vaginal spotting - She will follow up for evaluation. - Ambulatory referral to Obstetrics / Gynecology  4. Acute cough - She will continue on current treatment regimen and notify the clinic for worsening symptoms. - benzonatate (TESSALON PERLES) 100 MG capsule; Take 1 capsule (100 mg total) by mouth 3 (three) times daily as needed.  Dispense: 20 capsule; Refill: 0  5. Gastroesophageal reflux disease without esophagitis - She will continue on current medication,  -Avoid spicy, fatty and fried food -Avoid sodas and sour juices -Avoid heavy meals -Avoid eating 4 hours before bedtime -Elevate head of bed at night    Return in about 4 weeks (around 09/24/2021), or if symptoms worsen or fail to improve.    Gabrielle Pettibone Jerold Coombe, NP

## 2021-08-27 NOTE — Progress Notes (Signed)
NEUROLOGY FOLLOW UP OFFICE NOTE  [place] [id number]  Assessment/Plan:     Acute right parietal infarct History of recurrent strokes and TIAs of unknown etiology Hypertension Hyperlipidemia Tobacco use disorder - less than 1 ppd.   Continue Zio patch.  If unrevealing, would refer to cardiology for consideration of implantable loop recorder 2.  Secondary stroke prevention as managed by PCP: - ASA 81mg  and Plavix 75mg  daily - Statin therapy.  LDL goal less than 70 - Normotensive blood pressure - Hgb A1c goal less than 7 2. Smoking cessation 3. Mediterranean  4. Routine exercise 5. Follow up 6 months.   Subjective:  Virdie Penning is a 51 year old right-handed female with HTN, CKD, COPD, tobacco use disorder, ischemic colitis and history of strokes who follows up for stroke. Recent CT/MRI brain and MRA head and neck personally reviewed.   UPDATE: Current medications:  ASA 81mg , Plavix 75mg , clonidine, amlodipine, clonidine, atorvastatin 40mg  daily    Admitted to [facility] for another stroke.  She noted transient bilateral leg weakness ("legs felt like jelly").  She also woke up that morning feeling dizzy.  In the hospital, patient was without neurologic deficits.  MRI of brain showed probable subacute right parietal infarct with cortical laminar necrosis as well as chronic small vessel ischemic changes with remote infarcts in the left occipital lobe, left frontal lobe, and cerebellum.  MRA head and neck was normal.  2D echo showed LVEF 60-65% with aortic valve sclerosis and moderate regurg and negative bubble study.  Labs included LDL 78, negative Factor V leiden gene muation and negative HIV.  She had an elevated troponin determined to be secondary to stroke. Discharged on Zio patch.  Currently taking ASA and Plavix    HISTORY: She had a stroke at age 51.  She doesn't remember her symptoms.   She had a second stroke in addition to an acute ischemic  colitis in 2014.  She does not remember her symptoms but records report right homonymous hemianopsia and right arm and leg weakness and numbness of 2 months duration.   MRI of brain without contrast from 12/29/2012 demonstrated "[R]emote moderate size left occipital/posterior inferior left temporal lobe infarct with small area of involvement of the inferior left parietal lobe.  Associated encephalomalacia and laminar necrosis.  No evidence of acute infarct.  Remote small left frontal lobe infarct.  Mild to moderate nonspecific white matter type changes.Marland KitchenMarland KitchenGlobal atrophy without hydrocephalus."  She reports that no cause for her strokes were ever found.  Following her stroke, she never underwent PT/OT because she had no insurance.  Since the second stroke, she continues to have aching and cramping of her hand down to her elbow with use of her hand.  She also has numbness involving the first 3 digits of her right hand.  She has history of carpal tunnel syndrome diagnosed many years ago, so she tried wearing a brace.  NCV-EMG of right upper extremity on 11/23/2018 was normal.   She was admitted to [facility] in November 2021 for a third stroke presenting with sudden left sided weakness.  Blood pressure was in the 355D systolic.  CT head was negative for bleed.  She received IV tPA.  MRI of brain showed small acute posterior right frontal lobe infarct.  2D echo showed EF 65-70% with no source of embolus.  TEE showed moderate AI with degenerative valve disease with AV appearance of libman sack endocarditis with leaflets retracted with post-inflammatory  look but no PFO or clot.  Blood cultures were negative.  [employer] Virus 2 negative, LDL 97, and Hgb A1c 5.7.  Hypercoagulable panel showed homocysteine 52 (thought to be secondary to smoking), antithrombin III 121, protein C 109, B2GPI negative, lupus anticoagulant panel negative and cardiolipin negative.  She was discharged on ASA and Plavix 75mg  daily for 3  weeks followed by Plavix alone.  Zocor was increased to 40mg  daily, however her PCP reduced dose due to her kidney disease.    On 07/21/2020, she developed sudden onset right sided weakness and aphasia lasting 20 minutes.  She went to the ED where CT and MRI of brain showed chronic cortical infarcts, primarily left occipital lobe, but no acute stroke.  MRA of head limited due to motion but no large vessel occlusion. Carotid ultrasound negative for hemodynamically significant stenosis.  Echocardiogram showed EF 65-70% with negative bubble study.  LDL was 119 and Hgb A1c was 5.9.  She was found to be positive for COVID.  Discharged on ASA and Plavix for 3 weeks followed by Plavix alone.  Simvastatin was changed to atorvastatin 40mg .  Unclear if this was a left hemispheric transient ischemic attack vs recrudescence of previous stroke symptoms in setting of COVID.    PAST MEDICAL HISTORY: Past Medical History:  Diagnosis Date   Acid reflux    Allergic rhinitis 06/26/2015   Allergy    Asthma    Carpal tunnel syndrome    right hand   Chronic kidney disease    Colitis    CVA (cerebral infarction)    2009, 2015   Gout    History of syncope    Hypertension 2008   Low HDL (under 40) 06/26/2015   MI (myocardial infarction) ([place])    2015 - patient wasnt told by cardiologist that she had MI but has had cardiac cath in past   Migraines    Poor circulation    Renal insufficiency    Stroke [facility])    Syncope and collapse     MEDICATIONS: Current Outpatient Medications on File Prior to Visit  Medication Sig Dispense Refill   acetaminophen (TYLENOL) 500 MG tablet Take 500 mg by mouth every 6 (six) hours as needed for fever or mild pain.     albuterol (PROVENTIL HFA) 108 (90 Base) MCG/ACT inhaler Inhale 2 puffs into the lungs once every 4 (four) hours as needed for wheezing (cough). 6.7 g 2   allopurinol (ZYLOPRIM) 100 MG tablet TAKE ONE TABLET BY MOUTH ONCE DAILY. 30 tablet 11   aspirin EC 81 MG tablet  Take 1 tablet (81 mg total) by mouth once daily. Swallow whole. 90 tablet 0   atorvastatin (LIPITOR) 80 MG tablet Take 1 tablet (80 mg total) by mouth daily. 30 tablet 0   benzonatate (TESSALON PERLES) 100 MG capsule Take 1 capsule (100 mg total) by mouth 3 (three) times daily as needed. 20 capsule 0   cetirizine (ZYRTEC) 10 MG tablet TAKE ONE TABLET BY MOUTH ONCE DAILY. 90 tablet 1   cloNIDine (CATAPRES) 0.3 MG tablet Take 1 tablet by mouth 3 times daily (morning, noon, and evening). 90 tablet 11   clopidogrel (PLAVIX) 75 MG tablet Take 1 tablet (75 mg total) by mouth once daily. 90 tablet 1   fluticasone (FLONASE) 50 MCG/ACT nasal spray SPRAY 1 SPRAY INTO BOTH NOSTRILS 2 TIMES A DAY 16 g 0   fluticasone-salmeterol (ADVAIR DISKUS) 250-50 MCG/ACT AEPB Inhale 1 puff into the lungs in the morning and  at bedtime. 60 each 2   furosemide (LASIX) 40 MG tablet Take 40 mg by mouth daily.     lidocaine (XYLOCAINE) 5 % ointment Apply 1 application to the affected area(s) topically once daily as needed. 35.44 g 0   losartan (COZAAR) 25 MG tablet Take 1 tablet (25 mg total) by mouth once daily. 30 tablet 3   omeprazole (PRILOSEC) 20 MG capsule TAKE ONE CAPSULE BY MOUTH ONCE DAILY. 90 capsule 0   potassium chloride SA (KLOR-CON) 20 MEQ tablet Take 1 tablet (20 mEq total) by mouth once daily as needed (potassium). 45 tablet 0   promethazine (PHENERGAN) 25 MG tablet TAKE 1 TABLET BY MOUTH ONCE EVERY 6 HOURS AS NEEDED FOR NAUSEA OR VOMITING. 15 tablet 0   traZODone (DESYREL) 100 MG tablet Take 100 mg by mouth at bedtime.     [DISCONTINUED] simvastatin (ZOCOR) 10 MG tablet TAKE ONE TABLET BY MOUTH EVERY DAY (Patient not taking: Reported on 07/22/2020) 90 tablet 0   No current facility-administered medications on file prior to visit.    ALLERGIES: Allergies  Allergen Reactions   Coconut (Cocos Nucifera) Hives and Swelling    Just coconut ("all coconut")   Amoxicillin Other (See Comments)    "makes me sick"  "swelled up everywhere" Has patient had a PCN reaction causing immediate rash, facial/tongue/throat swelling, SOB or lightheadedness with hypotension: No Has patient had a PCN reaction causing severe rash involving mucus membranes or skin necrosis: No Has patient had a PCN reaction that required hospitalization: No Has patient had a PCN reaction occurring within the last 10 years: No If all of the above answers are "NO", then may proceed with Cephalosporin use.     Penicillins Other (See Comments)    "makes me sick" "swelled up oil" Has patient had a PCN reaction causing immediate rash, facial/tongue/throat swelling, SOB or lightheadedness with hypotension: No Has patient had a PCN reaction causing severe rash involving mucus membranes or skin necrosis: No Has patient had a PCN reaction that required hospitalization: No Has patient had a PCN reaction occurring within the last 10 years: Yes If all of the above answers are "NO", then may proceed with Cephalosporin use.    Singulair [Montelukast Sodium] Rash    "made heart race"    FAMILY HISTORY: Family History  Problem Relation Age of Onset   Hyperlipidemia Mother    Heart disease Mother        has a defibrillator   COPD Mother    Hypertension Father    Heart disease Father    Hyperlipidemia Father    Hypertension Sister        died in her 6s from heart failure and end stage kidney disease   Kidney disease Sister    Hypertension Brother    Heart disease Brother        has a defibrillator   Coronary artery disease Brother        had CABG   Hyperlipidemia Brother    Healthy Child    Diabetes Child       Objective:  Blood pressure 124/69, pulse 65, height 5\' 1"  (1.549 m), weight 160 lb 9.6 oz (72.8 kg), last menstrual period 08/18/2020, SpO2 98 %. General: No acute distress.  Patient appears well-groomed.   Head:  Normocephalic/atraumatic Eyes:  Fundi examined but not visualized Neck: supple, no paraspinal tenderness,  full range of motion Heart:  Regular rate and rhythm Lungs:  Clear to auscultation bilaterally Back: No paraspinal tenderness Neurological  Exam: alert and oriented to person, place, and time.  Speech fluent and not dysarthric, language intact.  Right homonymous hemianopsia.  Otherwise, CN II-XII intact. Bulk and tone normal, muscle strength 5/5 throughout.  Sensation to light touch intact.  Deep tendon reflexes 2+ throughout, toes downgoing.  Finger to nose testing intact.  Broad-based cautious gait.  Ambulates with cane.  Romberg with mild sway.   Metta Clines, DO  CC: Caryl Asp, NP

## 2021-08-28 ENCOUNTER — Encounter: Payer: Self-pay | Admitting: Neurology

## 2021-08-28 ENCOUNTER — Other Ambulatory Visit: Payer: Self-pay

## 2021-08-28 ENCOUNTER — Ambulatory Visit (INDEPENDENT_AMBULATORY_CARE_PROVIDER_SITE_OTHER): Payer: Medicaid Other | Admitting: Neurology

## 2021-08-28 VITALS — BP 124/69 | HR 65 | Ht 61.0 in | Wt 160.6 lb

## 2021-08-28 DIAGNOSIS — H53461 Homonymous bilateral field defects, right side: Secondary | ICD-10-CM | POA: Diagnosis not present

## 2021-08-28 DIAGNOSIS — I639 Cerebral infarction, unspecified: Secondary | ICD-10-CM

## 2021-08-28 DIAGNOSIS — I10 Essential (primary) hypertension: Secondary | ICD-10-CM

## 2021-08-28 DIAGNOSIS — E785 Hyperlipidemia, unspecified: Secondary | ICD-10-CM

## 2021-08-28 DIAGNOSIS — Z720 Tobacco use: Secondary | ICD-10-CM

## 2021-08-28 NOTE — Patient Instructions (Signed)
If Zio patch negative, recommended seeing cardiology for implantable loop recorder Continue aspirin 81mg  daily and Plavix 75mg  daily Continue atorvastatin and blood pressure medication Follow up 6 months.

## 2021-09-01 ENCOUNTER — Other Ambulatory Visit: Payer: Self-pay | Admitting: Gerontology

## 2021-09-01 ENCOUNTER — Other Ambulatory Visit: Payer: Self-pay

## 2021-09-01 DIAGNOSIS — K219 Gastro-esophageal reflux disease without esophagitis: Secondary | ICD-10-CM

## 2021-09-01 DIAGNOSIS — R051 Acute cough: Secondary | ICD-10-CM

## 2021-09-01 DIAGNOSIS — I10 Essential (primary) hypertension: Secondary | ICD-10-CM

## 2021-09-01 DIAGNOSIS — R110 Nausea: Secondary | ICD-10-CM

## 2021-09-01 DIAGNOSIS — Z889 Allergy status to unspecified drugs, medicaments and biological substances status: Secondary | ICD-10-CM

## 2021-09-01 DIAGNOSIS — J3089 Other allergic rhinitis: Secondary | ICD-10-CM

## 2021-09-01 MED FILL — Atorvastatin Calcium Tab 80 MG (Base Equivalent): ORAL | 30 days supply | Qty: 30 | Fill #0 | Status: CN

## 2021-09-01 MED FILL — Aspirin Tab Delayed Release 81 MG: ORAL | 90 days supply | Qty: 90 | Fill #0 | Status: AC

## 2021-09-01 MED FILL — Promethazine HCl Tab 25 MG: ORAL | 4 days supply | Qty: 15 | Fill #0 | Status: AC

## 2021-09-01 MED FILL — Fluticasone Propionate Nasal Susp 50 MCG/ACT: NASAL | 30 days supply | Qty: 16 | Fill #0 | Status: AC

## 2021-09-01 MED FILL — Omeprazole Cap Delayed Release 20 MG: ORAL | 90 days supply | Qty: 90 | Fill #0 | Status: AC

## 2021-09-02 ENCOUNTER — Other Ambulatory Visit: Payer: Self-pay | Admitting: Gerontology

## 2021-09-02 ENCOUNTER — Other Ambulatory Visit: Payer: Self-pay

## 2021-09-02 DIAGNOSIS — I10 Essential (primary) hypertension: Secondary | ICD-10-CM

## 2021-09-02 DIAGNOSIS — R051 Acute cough: Secondary | ICD-10-CM

## 2021-09-03 ENCOUNTER — Other Ambulatory Visit: Payer: Self-pay

## 2021-09-08 NOTE — Progress Notes (Deleted)
[name]  [phone] [phone]) [phone]  ID: Gabrielle Schultz OB: Nov 05, 1970  MR#: [id number]  CSN#:[id number]  Patient Care Team: Langston Reusing, NP as PCP - General (Gerontology) [place], Heath Gold, RN  CHIEF COMPLAINT: ***  INTERVAL HISTORY: ***  REVIEW OF SYSTEMS:   ROS  As per HPI. Otherwise, a complete review of systems is negative.  PAST MEDICAL HISTORY: Past Medical History:  Diagnosis Date   Acid reflux    Allergic rhinitis 06/26/2015   Allergy    Asthma    Carpal tunnel syndrome    right hand   Chronic kidney disease    Colitis    CVA (cerebral infarction)    2009, 2015   Gout    History of syncope    Hypertension 2008   Low HDL (under 40) 06/26/2015   MI (myocardial infarction) ([place])    2015 - patient wasnt told by cardiologist that she had MI but has had cardiac cath in past   Migraines    Poor circulation    Renal insufficiency    Stroke [facility])    Syncope and collapse     PAST SURGICAL HISTORY: Past Surgical History:  Procedure Laterality Date   BUBBLE STUDY  12/03/2019   Procedure: BUBBLE STUDY;  Surgeon: Geralynn Rile, MD;  Location: [place];  Service: Cardiovascular;;   CARDIAC CATHETERIZATION     CESAREAN SECTION  1994   Dr. Ammie Dalton   COLONOSCOPY WITH PROPOFOL N/A 06/26/2014   Procedure: COLONOSCOPY WITH PROPOFOL;  Surgeon: Josefine Class, MD;  Location: [facility] ENDOSCOPY;  Service: Endoscopy;  Laterality: N/A;   DILATION AND CURETTAGE OF UTERUS  x2 for incomplete SABs   TEE WITHOUT CARDIOVERSION N/A 12/03/2019   Procedure: TRANSESOPHAGEAL ECHOCARDIOGRAM (TEE);  Surgeon: Geralynn Rile, MD;  Location: [place];  Service: Cardiovascular;  Laterality: N/A;    FAMILY HISTORY: Family History  Problem Relation Age of Onset   Hyperlipidemia Mother    Heart disease Mother        has a defibrillator   COPD Mother    Hypertension Father    Heart disease Father     Hyperlipidemia Father    Hypertension Sister        died in her 77s from heart failure and end stage kidney disease   Kidney disease Sister    Hypertension Brother    Heart disease Brother        has a defibrillator   Coronary artery disease Brother        had CABG   Hyperlipidemia Brother    Healthy Child    Diabetes Child     ADVANCED DIRECTIVES (Y/N):  N  HEALTH MAINTENANCE: Social History   Tobacco Use   Smoking status: Every Day    Packs/day: 0.25    Years: 33.00    Total pack years: 8.25    Types: Cigarettes    Start date: 03/04/2017   Smokeless tobacco: Never  Vaping Use   Vaping Use: Never used  Substance Use Topics   Alcohol use: Yes    Comment: rare- drinks an occassional wine cooler   Drug use: No     Colonoscopy:  PAP:  Bone density:  Lipid panel:  Allergies  Allergen Reactions   Coconut (Cocos Nucifera) Hives and Swelling    Just coconut ("all coconut")   Amoxicillin Other (See Comments)    "makes me sick" "swelled up everywhere" Has patient had a PCN reaction causing immediate rash,  facial/tongue/throat swelling, SOB or lightheadedness with hypotension: No Has patient had a PCN reaction causing severe rash involving mucus membranes or skin necrosis: No Has patient had a PCN reaction that required hospitalization: No Has patient had a PCN reaction occurring within the last 10 years: No If all of the above answers are "NO", then may proceed with Cephalosporin use.     Penicillins Other (See Comments)    "makes me sick" "swelled up oil" Has patient had a PCN reaction causing immediate rash, facial/tongue/throat swelling, SOB or lightheadedness with hypotension: No Has patient had a PCN reaction causing severe rash involving mucus membranes or skin necrosis: No Has patient had a PCN reaction that required hospitalization: No Has patient had a PCN reaction occurring within the last 10 years: Yes If all of the above answers are "NO", then may proceed  with Cephalosporin use.    Singulair [Montelukast Sodium] Rash    "made heart race"    Current Outpatient Medications  Medication Sig Dispense Refill   acetaminophen (TYLENOL) 500 MG tablet Take 500 mg by mouth every 6 (six) hours as needed for fever or mild pain.     albuterol (PROVENTIL HFA) 108 (90 Base) MCG/ACT inhaler Inhale 2 puffs into the lungs once every 4 (four) hours as needed for wheezing (cough). 6.7 g 2   allopurinol (ZYLOPRIM) 100 MG tablet TAKE ONE TABLET BY MOUTH ONCE DAILY. 30 tablet 11   aspirin EC 81 MG tablet Take 1 tablet (81 mg total) by mouth once daily. Swallow whole. 90 tablet 0   atorvastatin (LIPITOR) 80 MG tablet Take 1 tablet (80 mg total) by mouth daily. 30 tablet 0   cetirizine (ZYRTEC) 10 MG tablet TAKE ONE TABLET BY MOUTH ONCE DAILY. 90 tablet 1   cloNIDine (CATAPRES) 0.3 MG tablet Take 1 tablet by mouth 3 times daily (morning, noon, and evening). 90 tablet 11   clopidogrel (PLAVIX) 75 MG tablet Take 1 tablet (75 mg total) by mouth once daily. 90 tablet 1   fluticasone (FLONASE) 50 MCG/ACT nasal spray SPRAY 1 SPRAY INTO BOTH NOSTRILS 2 TIMES A DAY 16 g 0   fluticasone-salmeterol (ADVAIR DISKUS) 250-50 MCG/ACT AEPB Inhale 1 puff into the lungs in the morning and at bedtime. 60 each 2   furosemide (LASIX) 40 MG tablet Take 40 mg by mouth daily.     lidocaine (XYLOCAINE) 5 % ointment Apply 1 application to the affected area(s) topically once daily as needed. 35.44 g 0   losartan (COZAAR) 25 MG tablet Take 1 tablet (25 mg total) by mouth once daily. 30 tablet 3   omeprazole (PRILOSEC) 20 MG capsule TAKE ONE CAPSULE BY MOUTH ONCE DAILY. 90 capsule 0   potassium chloride SA (KLOR-CON) 20 MEQ tablet Take 1 tablet (20 mEq total) by mouth once daily as needed (potassium). 45 tablet 0   promethazine (PHENERGAN) 25 MG tablet TAKE 1 TABLET BY MOUTH ONCE EVERY 6 HOURS AS NEEDED FOR NAUSEA OR VOMITING. 15 tablet 0   No current facility-administered medications for this  visit.    OBJECTIVE: There were no vitals filed for this visit.   There is no height or weight on file to calculate BMI.    ECOG FS:{CHL ONC [id number]  General: Well-developed, well-nourished, no acute distress. Eyes: Pink conjunctiva, anicteric sclera. HEENT: Normocephalic, moist mucous membranes. Lungs: No audible wheezing or coughing. Heart: Regular rate and rhythm. Abdomen: Soft, nontender, no obvious distention. Musculoskeletal: No edema, cyanosis, or clubbing. Neuro: Alert, answering all questions appropriately.  Cranial nerves grossly intact. Skin: No rashes or petechiae noted. Psych: Normal affect. Lymphatics: No cervical, calvicular, axillary or inguinal LAD.   LAB RESULTS:  Lab Results  Component Value Date   NA 139 08/19/2021   K 3.7 08/19/2021   CL 111 08/19/2021   CO2 20 (L) 08/19/2021   GLUCOSE 95 08/19/2021   BUN 22 (H) 08/19/2021   CREATININE 1.57 (H) 08/19/2021   CALCIUM 9.0 08/19/2021   PROT 6.3 08/13/2021   ALBUMIN 3.9 08/13/2021   AST 9 08/13/2021   ALT 5 08/13/2021   ALKPHOS 163 (H) 08/13/2021   BILITOT 0.2 08/13/2021   GFRNONAA 40 (L) 08/19/2021   GFRAA 35 (L) 01/10/2020    Lab Results  Component Value Date   WBC 8.2 08/19/2021   NEUTROABS 5.4 05/21/2021   HGB 12.9 08/19/2021   HCT 38.0 08/19/2021   MCV 96.9 08/19/2021   PLT 226 08/19/2021     STUDIES: ECHOCARDIOGRAM COMPLETE BUBBLE STUDY  Result Date: 08/21/2021    ECHOCARDIOGRAM REPORT   Patient Name:   Gabrielle Schultz Date of Exam: 08/20/2021 Medical Rec #:  [id number]      Height:       61.0 in Accession #:    [id number]     Weight:       160.3 lb Date of Birth:  [birth year]     BSA:          1.719 m Patient Age:    51 years       BP:           170/53 mmHg Patient Gender: F              HR:           80 bpm. Exam Location:  [facility] Procedure: 2D Echo, Color Doppler, Cardiac Doppler and Saline Contrast Bubble            Study Indications:     I63.9 Stroke  History:         Patient has  prior history of Echocardiogram examinations, most                  recent 07/22/2020. Previous Myocardial Infarction, Stroke; Risk                  Factors:Hypertension.  Sonographer:     Charmayne Sheer Referring Phys:  [place] Diagnosing Phys: Ida Rogue MD  Sonographer Comments: Suboptimal parasternal window and suboptimal subcostal window. IMPRESSIONS  1. Agitated saline contrast bubble study was negative, with no evidence of any interatrial shunt.  2. Left ventricular ejection fraction, by estimation, is 60 to 65%. The left ventricle has normal function. The left ventricle has no regional wall motion abnormalities. There is mild left ventricular hypertrophy. Left ventricular diastolic parameters are consistent with Grade I diastolic dysfunction (impaired relaxation).  3. Right ventricular systolic function is normal. The right ventricular size is normal. Tricuspid regurgitation signal is inadequate for assessing PA pressure.  4. Left atrial size was mildly dilated.  5. The mitral valve is normal in structure. No evidence of mitral valve regurgitation. No evidence of mitral stenosis.  6. The aortic valve has an indeterminant number of cusps. Aortic valve regurgitation is moderate. Aortic valve sclerosis is present, with no evidence of aortic valve stenosis.  7. The inferior vena cava is normal in size with greater than 50% respiratory variability, suggesting right atrial pressure of 3 mmHg. FINDINGS  Left Ventricle: Left ventricular ejection fraction,  by estimation, is 60 to 65%. The left ventricle has normal function. The left ventricle has no regional wall motion abnormalities. The left ventricular internal cavity size was normal in size. There is  mild left ventricular hypertrophy. Left ventricular diastolic parameters are consistent with Grade I diastolic dysfunction (impaired relaxation). Right Ventricle: The right ventricular size is normal. No increase in right ventricular wall thickness.  Right ventricular systolic function is normal. Tricuspid regurgitation signal is inadequate for assessing PA pressure. Left Atrium: Left atrial size was mildly dilated. Right Atrium: Right atrial size was normal in size. Pericardium: There is no evidence of pericardial effusion. Mitral Valve: The mitral valve is normal in structure. No evidence of mitral valve regurgitation. No evidence of mitral valve stenosis. Tricuspid Valve: The tricuspid valve is normal in structure. Tricuspid valve regurgitation is not demonstrated. No evidence of tricuspid stenosis. Aortic Valve: The aortic valve has an indeterminant number of cusps. Aortic valve regurgitation is moderate. Aortic regurgitation PHT measures 405 msec. Aortic valve sclerosis is present, with no evidence of aortic valve stenosis. Aortic valve mean gradient measures 5.0 mmHg. Aortic valve peak gradient measures 8.9 mmHg. Aortic valve area, by VTI measures 4.47 cm. Pulmonic Valve: The pulmonic valve was normal in structure. Pulmonic valve regurgitation is not visualized. No evidence of pulmonic stenosis. Aorta: The aortic root is normal in size and structure. Venous: The inferior vena cava is normal in size with greater than 50% respiratory variability, suggesting right atrial pressure of 3 mmHg. IAS/Shunts: No atrial level shunt detected by color flow Doppler. Agitated saline contrast was given intravenously to evaluate for intracardiac shunting. Agitated saline contrast bubble study was negative, with no evidence of any interatrial shunt.  LEFT VENTRICLE PLAX 2D LVIDd:         4.71 cm      Diastology LVIDs:         3.35 cm      LV e' medial:    6.42 cm/s LV PW:         1.09 cm      LV E/e' medial:  13.5 LV IVS:        1.17 cm      LV e' lateral:   7.72 cm/s LVOT diam:     2.30 cm      LV E/e' lateral: 11.3 LV SV:         112 LV SV Index:   65 LVOT Area:     4.15 cm  LV Volumes (MOD) LV vol d, MOD A2C: 126.0 ml LV vol d, MOD A4C: 136.0 ml LV vol s, MOD A2C: 46.3  ml LV vol s, MOD A4C: 46.9 ml LV SV MOD A2C:     79.7 ml LV SV MOD A4C:     136.0 ml LV SV MOD BP:      94.5 ml RIGHT VENTRICLE RV Basal diam:  2.63 cm LEFT ATRIUM             Index LA diam:        4.30 cm 2.50 cm/m LA Vol (A2C):   34.3 ml 19.95 ml/m LA Vol (A4C):   30.0 ml 17.45 ml/m LA Biplane Vol: 32.1 ml 18.67 ml/m  AORTIC VALVE                     PULMONIC VALVE AV Area (Vmax):    4.04 cm      PV Vmax:       1.75 m/s AV Area (Vmean):  3.96 cm      PV Peak grad:  12.2 mmHg AV Area (VTI):     4.47 cm AV Vmax:           149.00 cm/s AV Vmean:          100.000 cm/s AV VTI:            0.251 m AV Peak Grad:      8.9 mmHg AV Mean Grad:      5.0 mmHg LVOT Vmax:         145.00 cm/s LVOT Vmean:        95.400 cm/s LVOT VTI:          0.270 m LVOT/AV VTI ratio: 1.08 AI PHT:            405 msec  AORTA Ao Root diam: 3.00 cm MITRAL VALVE MV Area (PHT): 4.63 cm    SHUNTS MV Decel Time: 164 msec    Systemic VTI:  0.27 m MV E velocity: 86.90 cm/s  Systemic Diam: 2.30 cm MV A velocity: 87.50 cm/s MV E/A ratio:  0.99 Ida Rogue MD Electronically signed by Ida Rogue MD Signature Date/Time: 08/21/2021/10:45:28 AM    Final    MR ANGIO HEAD WO CONTRAST  Result Date: 08/20/2021 CLINICAL DATA:  Acute neurologic deficit EXAM: MRA HEAD WITHOUT CONTRAST TECHNIQUE: Angiographic images of the Circle of Willis were acquired using MRA technique without intravenous contrast. COMPARISON:  None Available. FINDINGS: POSTERIOR CIRCULATION: --Vertebral arteries: Normal --Inferior cerebellar arteries: Normal. --Basilar artery: Normal. --Superior cerebellar arteries: Normal. --Posterior cerebral arteries: Normal. ANTERIOR CIRCULATION: --Intracranial internal carotid arteries: Normal. --Anterior cerebral arteries (ACA): Normal. --Middle cerebral arteries (MCA): Normal. ANATOMIC VARIANTS: None IMPRESSION: Normal intracranial MRA. Electronically Signed   By: Ulyses Jarred M.D.   On: 08/20/2021 01:53   MR ANGIO NECK WO  CONTRAST  Result Date: 08/20/2021 CLINICAL DATA:  Acute neurologic deficits EXAM: MRA NECK WITHOUT CONTRAST TECHNIQUE: Angiographic images of the neck were acquired using MRA technique without intravenous contrast. Carotid stenosis measurements (when applicable) are obtained utilizing NASCET criteria, using the distal internal carotid diameter as the denominator. COMPARISON:  None Available. FINDINGS: Aortic arch: Normal branching pattern.  Normal. Right carotid system: No stenosis or occlusion. Left carotid system: No stenosis or occlusion. Vertebral arteries: Codominant system.  Normal. Other: None. IMPRESSION: Normal MRA of the neck. Electronically Signed   By: Ulyses Jarred M.D.   On: 08/20/2021 01:04   MR BRAIN WO CONTRAST  Result Date: 08/19/2021 CLINICAL DATA:  Neuro deficit, acute, stroke suspected EXAM: MRI HEAD WITHOUT CONTRAST TECHNIQUE: Multiplanar, multiecho pulse sequences of the brain and surrounding structures were obtained without intravenous contrast. COMPARISON:  Same day CT head.  MRI head 07/22/2020. FINDINGS: Brain: Findings on same day CT head correlates with a parietal infarct which demonstrates mild peripheral restricted diffusion, mild FLAIR hyperintensity and curvilinear T1 hyperintensity, compatible with probably subacute infarct with laminar necrosis. Additional remote left occipital infarct with encephalomalacia and gliosis. Small remote cortical infarct in the left frontal lobe. Small remote lacunar infarcts in the cerebellum. Scattered T2/FLAIR hyperintensities in the white matter, nonspecific but compatible with chronic microvascular ischemic disease. No evidence of acute infarct, acute hemorrhage, mass lesion, midline shift or hydrocephalus. Vascular: Major arterial flow voids are maintained at the skull base. Skull and upper cervical spine: Normal marrow signal. Sinuses/Orbits: Clear sinuses.  No acute orbital findings. Other: No mastoid effusions. IMPRESSION: 1. Findings on  same day CT head correlates with a probably subacute  parietal infarct with evidence of cortical laminar necrosis. 2. Multiple remote infarcts and chronic microvascular ischemic disease, described above. Electronically Signed   By: Margaretha Sheffield M.D.   On: 08/19/2021 15:23   CT Head Wo Contrast  Result Date: 08/19/2021 CLINICAL DATA:  Syncope/presyncope, cerebrovascular cause suspected EXAM: CT HEAD WITHOUT CONTRAST TECHNIQUE: Contiguous axial images were obtained from the base of the skull through the vertex without intravenous contrast. RADIATION DOSE REDUCTION: This exam was performed according to the departmental dose-optimization program which includes automated exposure control, adjustment of the mA and/or kV according to patient size and/or use of iterative reconstruction technique. COMPARISON:  CT head 07/21/2020. FINDINGS: Brain: New hypoattenuation and loss of gray white differentiation in the high right frontoparietal region, concerning for acute or subacute infarct. Possible slight petechial hemorrhage. No mass occupying acute hemorrhage.No evidence of hydrocephalus, extra-axial collection or mass lesion/mass effect. Remote left occipital infarct, similar. Vascular: No hyperdense vessel identified. Calcific intracranial atherosclerosis. Skull: No acute fracture. Sinuses/Orbits: Clear sinuses.  No acute orbital findings. Other: No mastoid effusions. IMPRESSION: 1. New hypoattenuation and loss of gray white differentiation in the high right frontoparietal region, concerning for acute or subacute infarct. Possible slight petechial hemorrhage. No mass occupying acute hemorrhage. An MRI could further evaluate if clinically warranted. 2. Remote left occipital infarct. Electronically Signed   By: Margaretha Sheffield M.D.   On: 08/19/2021 13:32    ASSESSMENT:   PLAN:    Patient expressed understanding and was in agreement with this plan. She also understands that She can call clinic at any time with  any questions, concerns, or complaints.    Cancer Staging  No matching staging information was found for the patient.  Lloyd Huger, MD   09/08/2021 10:51 PM

## 2021-09-09 ENCOUNTER — Inpatient Hospital Stay: Payer: MEDICAID | Attending: Oncology | Admitting: Oncology

## 2021-09-11 NOTE — Addendum Note (Signed)
Encounter addended by: Jeannette How on: 09/11/2021 7:50 AM  Actions taken: Imaging Exam ended

## 2021-09-24 ENCOUNTER — Ambulatory Visit: Payer: Medicaid Other | Admitting: Gerontology

## 2021-09-24 VITALS — BP 131/85 | HR 61 | Temp 98.1°F | Resp 14 | Ht 61.0 in | Wt 161.8 lb

## 2021-09-24 DIAGNOSIS — I10 Essential (primary) hypertension: Secondary | ICD-10-CM

## 2021-09-24 DIAGNOSIS — E876 Hypokalemia: Secondary | ICD-10-CM

## 2021-09-24 DIAGNOSIS — E782 Mixed hyperlipidemia: Secondary | ICD-10-CM

## 2021-09-24 DIAGNOSIS — R600 Localized edema: Secondary | ICD-10-CM

## 2021-09-24 DIAGNOSIS — I351 Nonrheumatic aortic (valve) insufficiency: Secondary | ICD-10-CM

## 2021-09-24 MED ORDER — FUROSEMIDE 20 MG PO TABS
20.0000 mg | ORAL_TABLET | Freq: Every day | ORAL | 0 refills | Status: DC
  Filled 2021-09-24: qty 15, 15d supply, fill #0

## 2021-09-24 MED ORDER — ATORVASTATIN CALCIUM 80 MG PO TABS
80.0000 mg | ORAL_TABLET | Freq: Every day | ORAL | 0 refills | Status: DC
  Filled 2021-09-24: qty 30, 30d supply, fill #0

## 2021-09-24 MED ORDER — CLOPIDOGREL BISULFATE 75 MG PO TABS
75.0000 mg | ORAL_TABLET | Freq: Every day | ORAL | 1 refills | Status: DC
  Filled 2021-09-24 – 2021-11-23 (×5): qty 90, 90d supply, fill #0

## 2021-09-24 MED ORDER — POTASSIUM CHLORIDE CRYS ER 10 MEQ PO TBCR
10.0000 meq | EXTENDED_RELEASE_TABLET | ORAL | 0 refills | Status: DC | PRN
  Filled 2021-09-24: qty 15, 15d supply, fill #0

## 2021-09-24 MED ORDER — LOSARTAN POTASSIUM 25 MG PO TABS
25.0000 mg | ORAL_TABLET | Freq: Every day | ORAL | 3 refills | Status: DC
  Filled 2021-09-24: qty 30, 30d supply, fill #0
  Filled 2021-10-05: qty 30, 30d supply, fill #1

## 2021-09-24 NOTE — Patient Instructions (Signed)

## 2021-09-24 NOTE — Progress Notes (Signed)
Established Patient Office Visit  Subjective   Patient ID: Gabrielle Schultz, female    DOB: 06-14-1970  Age: 51 y.o. MRN: [id number]  Chief Complaint  Patient presents with   Follow-up   Hypertension    HPI  Gabrielle Schultz is a 51 y.o. female who  has a past medical history of Acid reflux, Allergic rhinitis (06/26/2015), Allergy, Asthma, Carpal tunnel syndrome, Chronic kidney disease, Colitis, CVA (cerebral infarction), Gout, History of syncope, Hypertension (2008), Low HDL (under 40) (06/26/2015), MI (myocardial infarction) ([place]), Migraines, Poor circulation, Renal insufficiency, Stroke ([place]), and Syncope presents for routine follow up visit. She was seen by the Neurologist [name] on 08/28/21, she states that she mailed back the [place] said that they did not receive it, she will continue on Asprin 81 mg and Plavix 75 mg daily and she will follow up in 6 months. Her Urine microalbuminuria/creatinine ration was 339 mg/g and will states that she will schedule an appointment with [facility]ist on 01/19/22. She continues to experience intermittent edema to bilateral leg, denies claudication and erythema. Overall, she states that she's doing well and offers no further complaint.  Review of Systems  Constitutional: Negative.   Eyes: Negative.   Respiratory: Negative.    Cardiovascular: Negative.   Genitourinary: Negative.   Neurological: Negative.   Endo/Heme/Allergies: Negative.   Psychiatric/Behavioral: Negative.        Objective:     BP 131/85 (BP Location: Left Arm)   Pulse 61   Temp 98.1 F (36.7 C) (Oral)   Resp 14   Ht 5\' 1"  (1.549 m)   Wt 161 lb 13.1 oz (73.4 kg)   SpO2 98%   BMI 30.58 kg/m  BP Readings from Last 3 Encounters:  09/24/21 131/85  08/28/21 124/69  08/27/21 111/71   Wt Readings from Last 3 Encounters:  09/24/21 161 lb 13.1 oz (73.4 kg)  08/28/21 160 lb 9.6 oz (72.8 kg)  08/27/21 161 lb (73 kg)      Physical Exam HENT:      Head: Normocephalic and atraumatic.     Mouth/Throat:     Mouth: Mucous membranes are moist.  Eyes:     Extraocular Movements: Extraocular movements intact.     Conjunctiva/sclera: Conjunctivae normal.     Pupils: Pupils are equal, round, and reactive to light.  Cardiovascular:     Rate and Rhythm: Normal rate and regular rhythm.     Pulses: Normal pulses.     Heart sounds: Normal heart sounds.  Pulmonary:     Effort: Pulmonary effort is normal.     Breath sounds: Normal breath sounds.  Abdominal:     General: Bowel sounds are normal.     Palpations: Abdomen is soft.  Musculoskeletal:     Right lower leg: Edema (+1 edema to BLE.) present.     Left lower leg: Edema present.  Skin:    General: Skin is warm.  Neurological:     General: No focal deficit present.     Mental Status: She is alert and oriented to person, place, and time. Mental status is at baseline.  Psychiatric:        Mood and Affect: Mood normal.        Behavior: Behavior normal.        Thought Content: Thought content normal.        Judgment: Judgment normal.      Results for orders placed or performed in visit on 09/24/21  Microalbumin / creatinine urine ratio  Result Value Ref Range   Creatinine, Urine 83.6 Not Estab. mg/dL   Microalbumin, Urine 283.4 Not Estab. ug/mL   Microalb/Creat Ratio 339 (H) 0 - 29 mg/g creat    Last CBC Lab Results  Component Value Date   WBC 8.2 08/19/2021   HGB 12.9 08/19/2021   HCT 38.0 08/19/2021   MCV 96.9 08/19/2021   MCH 32.9 08/19/2021   RDW 14.7 08/19/2021   PLT 226 78/29/5621   Last metabolic panel Lab Results  Component Value Date   GLUCOSE 95 08/19/2021   NA 139 08/19/2021   K 3.7 08/19/2021   CL 111 08/19/2021   CO2 20 (L) 08/19/2021   BUN 22 (H) 08/19/2021   CREATININE 1.57 (H) 08/19/2021   GFRNONAA 40 (L) 08/19/2021   CALCIUM 9.0 08/19/2021   PHOS 3.7 07/31/2015   PROT 6.3 08/13/2021   ALBUMIN 3.9 08/13/2021   LABGLOB 2.4 08/13/2021   AGRATIO  1.6 08/13/2021   BILITOT 0.2 08/13/2021   ALKPHOS 163 (H) 08/13/2021   AST 9 08/13/2021   ALT 5 08/13/2021   ANIONGAP 8 08/19/2021   Last lipids Lab Results  Component Value Date   CHOL 123 08/20/2021   HDL 19 (L) 08/20/2021   LDLCALC 78 08/20/2021   TRIG 128 08/20/2021   CHOLHDL 6.5 08/20/2021   Last hemoglobin A1c Lab Results  Component Value Date   HGBA1C 5.7 (H) 05/21/2021      The ASCVD Risk score (Arnett DK, et al., 2019) failed to calculate for the following reasons:   The patient has a prior MI or stroke diagnosis    Assessment & Plan:   1. Mixed hyperlipidemia - She will continue on current medication, low fat/cholesterol diet and exercise as tolerated. She was advised to go to the ED for hematuria, hematochezia and active bleeding. - atorvastatin (LIPITOR) 80 MG tablet; Take 1 tablet (80 mg total) by mouth daily.  Dispense: 30 tablet; Refill: 0 - clopidogrel (PLAVIX) 75 MG tablet; Take 1 tablet (75 mg total) by mouth once daily.  Dispense: 90 tablet; Refill: 1  2. Primary hypertension - Her blood pressure is improving, her goal should be less than 130/80. She will continue on current medication, follow up with Nephrologist. - Microalbumin / creatinine urine ratio; Future - losartan (COZAAR) 25 MG tablet; Take 1 tablet (25 mg total) by mouth once daily.  Dispense: 30 tablet; Refill: 3 - Microalbumin / creatinine urine ratio  3. Aortic valve insufficiency, etiology of cardiac valve disease unspecified -Per patient, her Ziop[employer] was not received by the company, she will follow up with Cardiologist. She was advised to go to the ED for worsening symptoms. - Ambulatory referral to Cardiology   4. Lower extremity edema - She has +1 edema to BLE, will continue on 20 mg Lasix for 2 weeks until she follows up  with Cardiologist. She was advised to elevate her legs while sitting down. - furosemide (LASIX) 20 MG tablet; Take 1 tablet (20 mg total) by mouth daily.   Dispense: 15 tablet; Refill: 0 - potassium chloride (KLOR-CON M) 10 MEQ tablet; Take 1 tablet (10 mEq total) by mouth as needed (potassium).  Dispense: 15 tablet; Refill: 0   Return in about 4 weeks (around 10/22/2021), or if symptoms worsen or fail to improve.    Shaya Altamura Jerold Coombe, NP

## 2021-09-25 ENCOUNTER — Other Ambulatory Visit: Payer: Self-pay

## 2021-09-25 LAB — MICROALBUMIN / CREATININE URINE RATIO
Creatinine, Urine: 83.6 mg/dL
Microalb/Creat Ratio: 339 mg/g creat — ABNORMAL HIGH (ref 0–29)
Microalbumin, Urine: 283.4 ug/mL

## 2021-10-05 ENCOUNTER — Other Ambulatory Visit: Payer: Self-pay

## 2021-10-05 ENCOUNTER — Other Ambulatory Visit: Payer: Self-pay | Admitting: Gerontology

## 2021-10-05 DIAGNOSIS — R600 Localized edema: Secondary | ICD-10-CM

## 2021-10-05 DIAGNOSIS — I10 Essential (primary) hypertension: Secondary | ICD-10-CM

## 2021-10-05 DIAGNOSIS — Z889 Allergy status to unspecified drugs, medicaments and biological substances status: Secondary | ICD-10-CM

## 2021-10-05 DIAGNOSIS — E782 Mixed hyperlipidemia: Secondary | ICD-10-CM

## 2021-10-05 DIAGNOSIS — K219 Gastro-esophageal reflux disease without esophagitis: Secondary | ICD-10-CM

## 2021-10-05 DIAGNOSIS — J3089 Other allergic rhinitis: Secondary | ICD-10-CM

## 2021-10-06 ENCOUNTER — Other Ambulatory Visit: Payer: Self-pay

## 2021-10-06 MED ORDER — OMEPRAZOLE 20 MG PO CPDR
DELAYED_RELEASE_CAPSULE | Freq: Every day | ORAL | 0 refills | Status: DC
  Filled 2021-10-06 – 2021-11-02 (×2): qty 90, fill #0
  Filled 2021-11-03 – 2021-11-30 (×2): qty 90, 90d supply, fill #0

## 2021-10-06 MED ORDER — CETIRIZINE HCL 10 MG PO TABS
ORAL_TABLET | Freq: Every day | ORAL | 0 refills | Status: DC
  Filled 2021-10-06 – 2021-11-02 (×3): qty 90, fill #0
  Filled 2021-11-03: qty 90, 90d supply, fill #0

## 2021-10-06 MED ORDER — ASPIRIN 81 MG PO TBEC
81.0000 mg | DELAYED_RELEASE_TABLET | Freq: Every day | ORAL | 0 refills | Status: DC
  Filled 2021-10-06 – 2021-11-03 (×4): qty 90, 90d supply, fill #0

## 2021-10-06 MED ORDER — FLUTICASONE PROPIONATE 50 MCG/ACT NA SUSP
NASAL | 0 refills | Status: DC
  Filled 2021-10-06: qty 16, 30d supply, fill #0

## 2021-10-07 ENCOUNTER — Other Ambulatory Visit: Payer: Self-pay

## 2021-10-08 ENCOUNTER — Other Ambulatory Visit: Payer: Self-pay | Admitting: Gerontology

## 2021-10-08 ENCOUNTER — Other Ambulatory Visit: Payer: Self-pay

## 2021-10-08 DIAGNOSIS — R110 Nausea: Secondary | ICD-10-CM

## 2021-10-08 DIAGNOSIS — R600 Localized edema: Secondary | ICD-10-CM

## 2021-10-09 ENCOUNTER — Other Ambulatory Visit: Payer: Self-pay

## 2021-10-09 MED ORDER — FLUTICASONE-SALMETEROL 250-50 MCG/ACT IN AEPB
INHALATION_SPRAY | RESPIRATORY_TRACT | 3 refills | Status: DC
  Filled 2021-10-15: qty 180, 90d supply, fill #0
  Filled 2021-11-03 – 2021-12-29 (×3): qty 180, 90d supply, fill #1

## 2021-10-13 ENCOUNTER — Other Ambulatory Visit: Payer: Self-pay

## 2021-10-13 MED ORDER — FUROSEMIDE 20 MG PO TABS
20.0000 mg | ORAL_TABLET | Freq: Every day | ORAL | 0 refills | Status: DC
  Filled 2021-10-13: qty 15, 15d supply, fill #0

## 2021-10-13 MED ORDER — PROMETHAZINE HCL 25 MG PO TABS
ORAL_TABLET | ORAL | 0 refills | Status: DC
  Filled 2021-10-13: qty 15, 4d supply, fill #0

## 2021-10-15 ENCOUNTER — Other Ambulatory Visit: Payer: Self-pay

## 2021-10-22 ENCOUNTER — Ambulatory Visit: Payer: Medicaid Other | Admitting: Gerontology

## 2021-10-22 VITALS — BP 161/78 | HR 58 | Temp 98.2°F | Ht 62.0 in | Wt 161.9 lb

## 2021-10-22 DIAGNOSIS — I10 Essential (primary) hypertension: Secondary | ICD-10-CM

## 2021-10-22 DIAGNOSIS — R600 Localized edema: Secondary | ICD-10-CM

## 2021-10-22 DIAGNOSIS — E782 Mixed hyperlipidemia: Secondary | ICD-10-CM

## 2021-10-22 DIAGNOSIS — R051 Acute cough: Secondary | ICD-10-CM

## 2021-10-22 MED ORDER — POTASSIUM CHLORIDE CRYS ER 10 MEQ PO TBCR
10.0000 meq | EXTENDED_RELEASE_TABLET | ORAL | 0 refills | Status: DC | PRN
  Filled 2021-10-22: qty 10, 10d supply, fill #0

## 2021-10-22 MED ORDER — BLOOD PRESSURE MONITOR MISC
1.0000 | Freq: Every day | 0 refills | Status: DC
  Filled 2021-10-22 – 2021-11-02 (×2): qty 1, fill #0
  Filled 2021-12-27 – 2021-12-29 (×3): qty 1, 30d supply, fill #0

## 2021-10-22 MED ORDER — BENZONATATE 100 MG PO CAPS
100.0000 mg | ORAL_CAPSULE | Freq: Three times a day (TID) | ORAL | 0 refills | Status: DC | PRN
  Filled 2021-10-22: qty 20, 7d supply, fill #0

## 2021-10-22 MED ORDER — ATORVASTATIN CALCIUM 80 MG PO TABS
80.0000 mg | ORAL_TABLET | Freq: Every day | ORAL | 1 refills | Status: DC
  Filled 2021-10-22: qty 90, 90d supply, fill #0

## 2021-10-22 MED ORDER — LOSARTAN POTASSIUM 50 MG PO TABS
50.0000 mg | ORAL_TABLET | Freq: Every day | ORAL | 0 refills | Status: DC
  Filled 2021-10-22: qty 30, 30d supply, fill #0

## 2021-10-22 NOTE — Patient Instructions (Signed)

## 2021-10-22 NOTE — Progress Notes (Signed)
Established Patient Office Visit  Subjective   Patient ID: Gabrielle Schultz, female    DOB: May 26, 1970  Age: 51 y.o. MRN: [id number]  Chief Complaint  Patient presents with   Follow-up    Blood pressure    HPI  Gabrielle Schultz is a 51 y.o. female who  has a past medical history of Acid reflux, Allergic rhinitis (06/26/2015), Allergy, Asthma, Carpal tunnel syndrome, Chronic kidney disease, Colitis, CVA (cerebral infarction), Gout, History of syncope, Hypertension (2008), Low HDL (under 40) (06/26/2015), MI (myocardial infarction) ([place]), Migraines, Poor circulation, Renal insufficiency, Stroke ([place]), and Syncope presents for routine follow up visit. She states that she's compliant with her medications, denies side effects and continues to make healthy lifestyle changes. She c/o constant non productive cough that started 2 days ago, denies sick contacts, rhinorrhea, sore throat, sinus pressure fever nor chills. She states that cough wakes her up at night and she takes Delsym with moderate relief. Her blood pressure is elevated at 165/67.She states that she's going through lots of stress because her son lost his job. She will follow up with Cardiologist Dr [name] on 11/03/21. She denies chest pain, palpitation, dizziness, headache and vision changes. Overall, she states that she's doing well and offers no further complaint.   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/2019   Cryptogenic stroke (HCC)    Hyperlipidemia LDL goal <70    AKI (acute kidney injury) ([place])    Obesity    MI (myocardial infarction) ([place])    Migraines    Stroke  (cerebrum) ([place]) 11/30/2019   Benign essential hypertension 12/23/2018   Edema of lower extremity 12/23/2018   Recurrent urinary tract infection 12/23/2018   Edema of both feet 09/23/2018   Urine frequency 10/06/2017   SIRS (systemic inflammatory response syndrome) ([place]) 01/18/2017   Abdominal pain 01/18/2017   Aortic regurgitation 09/08/2016   Chest pain 08/28/2016   Hypertensive emergency 07/31/2016   AMA (advanced maternal age) multigravida 35+, third trimester 00/37/0488   Asthma complicating pregnancy, antepartum 07/30/2016   Chronic hypertension affecting pregnancy 07/30/2016   H/O myocardial ischemia 07/30/2016   H/O: C-section 07/30/2016   H/O: stroke 07/30/2016   IUGR (intrauterine growth restriction) affecting care of mother, second trimester, fetus 1 07/30/2016   Supervision of high risk pregnancy in third trimester 07/30/2016   Edema of both feet 04/22/2016   Abnormal TSH 04/22/2016   Abdominal pain, right upper quadrant 12/02/2015   Dermatitis 12/02/2015   Urinary tract infection 10/28/2015   Low HDL (under 40) 06/26/2015   Allergic rhinitis 06/26/2015   Tobacco abuse 06/26/2015   Cerebral infarct ([place]) 11/21/2013   CKD (chronic kidney disease), stage IIIb ([place]) 11/21/2013   Hypertension 08/12/2011   LVH (left ventricular hypertrophy) 08/12/2011   Chronic renal insufficiency 08/12/2011   Past Medical History:  Diagnosis Date   Acid reflux    Allergic rhinitis 06/26/2015   Allergy    Asthma    Carpal tunnel syndrome    right hand   Chronic kidney disease    Colitis    CVA (cerebral infarction)    2009, 2015   Gout  History of syncope    Hypertension 2008   Low HDL (under 40) 06/26/2015   MI (myocardial infarction) (HCC)    2015 - patient wasnt told by cardiologist that she had MI but has had cardiac cath in past   Migraines    Poor circulation    Renal insufficiency    Stroke [facility])    Syncope and collapse    Past Surgical History:  Procedure  Laterality Date   BUBBLE STUDY  12/03/2019   Procedure: BUBBLE STUDY;  Surgeon: Sande Rives, MD;  Location: [facility] ENDOSCOPY;  Service: Cardiovascular;;   CARDIAC CATHETERIZATION     CESAREAN SECTION  1994   Dr. Arvil Chaco   COLONOSCOPY WITH PROPOFOL N/A 06/26/2014   Procedure: COLONOSCOPY WITH PROPOFOL;  Surgeon: Elnita Maxwell, MD;  Location: [facility] ENDOSCOPY;  Service: Endoscopy;  Laterality: N/A;   DILATION AND CURETTAGE OF UTERUS  x2 for incomplete SABs   TEE WITHOUT CARDIOVERSION N/A 12/03/2019   Procedure: TRANSESOPHAGEAL ECHOCARDIOGRAM (TEE);  Surgeon: Sande Rives, MD;  Location: [facility] ENDOSCOPY;  Service: Cardiovascular;  Laterality: N/A;   Social History   Tobacco Use   Smoking status: Every Day    Packs/day: 0.25    Years: 33.00    Total pack years: 8.25    Types: Cigarettes    Start date: 03/04/2017   Smokeless tobacco: Never  Vaping Use   Vaping Use: Never used  Substance Use Topics   Alcohol use: Yes    Comment: rare- drinks an occassional wine cooler   Drug use: No   Allergies  Allergen Reactions   Coconut (Cocos Nucifera) Hives and Swelling    Just coconut ("all coconut")   Amoxicillin Other (See Comments)    "makes me sick" "swelled up everywhere" Has patient had a PCN reaction causing immediate rash, facial/tongue/throat swelling, SOB or lightheadedness with hypotension: No Has patient had a PCN reaction causing severe rash involving mucus membranes or skin necrosis: No Has patient had a PCN reaction that required hospitalization: No Has patient had a PCN reaction occurring within the last 10 years: No If all of the above answers are "NO", then may proceed with Cephalosporin use.     Penicillins Other (See Comments)    "makes me sick" "swelled up oil" Has patient had a PCN reaction causing immediate rash, facial/tongue/throat swelling, SOB or lightheadedness with hypotension: No Has patient had a PCN reaction causing severe rash involving  mucus membranes or skin necrosis: No Has patient had a PCN reaction that required hospitalization: No Has patient had a PCN reaction occurring within the last 10 years: Yes If all of the above answers are "NO", then may proceed with Cephalosporin use.    Singulair [Montelukast Sodium] Rash    "made heart race"      Review of Systems  Constitutional: Negative.   HENT:  Negative for congestion, sinus pain and sore throat.   Eyes: Negative.   Respiratory:  Positive for cough. Negative for hemoptysis, sputum production, shortness of breath and wheezing.   Cardiovascular: Negative.  Negative for leg swelling and PND.  Skin: Negative.   Neurological: Negative.   Psychiatric/Behavioral: Negative.        Objective:     BP (!) 161/78 (BP Location: Left Arm, Patient Position: Sitting, Cuff Size: Large)   Pulse (!) 58   Temp 98.2 F (36.8 C)   Ht 5\' 2"  (1.575 m)   Wt 161 lb 14.4 oz (73.4 kg)   SpO2 98%  BMI 29.61 kg/m  BP Readings from Last 3 Encounters:  10/22/21 (!) 161/78  09/24/21 131/85  08/28/21 124/69   Wt Readings from Last 3 Encounters:  10/22/21 161 lb 14.4 oz (73.4 kg)  09/24/21 161 lb 13.1 oz (73.4 kg)  08/28/21 160 lb 9.6 oz (72.8 kg)      Physical Exam HENT:     Head: Normocephalic and atraumatic.     Mouth/Throat:     Mouth: Mucous membranes are moist.  Eyes:     Extraocular Movements: Extraocular movements intact.     Conjunctiva/sclera: Conjunctivae normal.     Pupils: Pupils are equal, round, and reactive to light.  Cardiovascular:     Rate and Rhythm: Normal rate and regular rhythm.  Pulmonary:     Effort: Pulmonary effort is normal.     Breath sounds: Normal breath sounds.  Neurological:     General: No focal deficit present.     Mental Status: She is alert and oriented to person, place, and time. Mental status is at baseline.  Psychiatric:        Mood and Affect: Mood normal.        Behavior: Behavior normal.        Thought Content: Thought  content normal.        Judgment: Judgment normal.      No results found for any visits on 10/22/21.  Last CBC Lab Results  Component Value Date   WBC 8.2 08/19/2021   HGB 12.9 08/19/2021   HCT 38.0 08/19/2021   MCV 96.9 08/19/2021   MCH 32.9 08/19/2021   RDW 14.7 08/19/2021   PLT 226 35/59/7416   Last metabolic panel Lab Results  Component Value Date   GLUCOSE 95 08/19/2021   NA 139 08/19/2021   K 3.7 08/19/2021   CL 111 08/19/2021   CO2 20 (L) 08/19/2021   BUN 22 (H) 08/19/2021   CREATININE 1.57 (H) 08/19/2021   GFRNONAA 40 (L) 08/19/2021   CALCIUM 9.0 08/19/2021   PHOS 3.7 07/31/2015   PROT 6.3 08/13/2021   ALBUMIN 3.9 08/13/2021   LABGLOB 2.4 08/13/2021   AGRATIO 1.6 08/13/2021   BILITOT 0.2 08/13/2021   ALKPHOS 163 (H) 08/13/2021   AST 9 08/13/2021   ALT 5 08/13/2021   ANIONGAP 8 08/19/2021   Last lipids Lab Results  Component Value Date   CHOL 123 08/20/2021   HDL 19 (L) 08/20/2021   LDLCALC 78 08/20/2021   TRIG 128 08/20/2021   CHOLHDL 6.5 08/20/2021   Last hemoglobin A1c Lab Results  Component Value Date   HGBA1C 5.7 (H) 05/21/2021   Last thyroid functions Lab Results  Component Value Date   TSH 1.356 08/19/2021   T4TOTAL 8.1 03/16/2016      The ASCVD Risk score (Arnett DK, et al., 2019) failed to calculate for the following reasons:   The patient has a prior MI or stroke diagnosis    Assessment & Plan:   1. Primary hypertension - Her blood pressure is not under control, her goal should be less than 130/80. Her Losartan was increased to 50 mg daily, advised to check blood pressure daily, record and bring log to follow up appointment. She was advised to continue on DASH diet and exercise as tolerated. - losartan (COZAAR) 50 MG tablet; Take 1 tablet (50 mg total) by mouth daily.  Dispense: 30 tablet; Refill: 0 - Blood Pressure KIT; 1 kit by Does not apply route daily at 2 PM.  Dispense: 1 kit;  Refill: 0 - Basic Metabolic Panel (BMET);  Future - Basic Metabolic Panel (BMET)  2. Mixed hyperlipidemia - She will continue current medication, low fat/ cholesterol diet and exercise as tolerated. - atorvastatin (LIPITOR) 80 MG tablet; Take 1 tablet (80 mg total) by mouth daily.  Dispense: 90 tablet; Refill: 1  3. Lower extremity edema - No edema, will follow up with Cardiologist and Nephrologist. - potassium chloride (KLOR-CON M) 10 MEQ tablet; Take 1 tablet (10 mEq total) by mouth as needed (potassium).  Dispense: 10 tablet; Refill: 0  4. Acute cough - She was provided with home Covid test and advised to notify clinic with result. She was advised to go to the ED for worsening symptoms. - benzonatate (TESSALON PERLES) 100 MG capsule; Take 1 capsule (100 mg total) by mouth 3 (three) times daily as needed.  Dispense: 20 capsule; Refill: 0   Return in about 3 weeks (around 11/12/2021), or if symptoms worsen or fail to improve.    Daana Petrasek Jerold Coombe, NP

## 2021-10-23 ENCOUNTER — Other Ambulatory Visit: Payer: Self-pay

## 2021-10-23 LAB — BASIC METABOLIC PANEL
BUN/Creatinine Ratio: 11 (ref 9–23)
BUN: 17 mg/dL (ref 6–24)
CO2: 19 mmol/L — ABNORMAL LOW (ref 20–29)
Calcium: 9 mg/dL (ref 8.7–10.2)
Chloride: 105 mmol/L (ref 96–106)
Creatinine, Ser: 1.48 mg/dL — ABNORMAL HIGH (ref 0.57–1.00)
Glucose: 87 mg/dL (ref 70–99)
Potassium: 4.4 mmol/L (ref 3.5–5.2)
Sodium: 140 mmol/L (ref 134–144)
eGFR: 43 mL/min/{1.73_m2} — ABNORMAL LOW (ref >59)

## 2021-10-27 ENCOUNTER — Emergency Department
Admission: EM | Admit: 2021-10-27 | Discharge: 2021-10-27 | Disposition: A | Discharge status: Home / Self Care | Payer: Medicaid Other | Attending: Emergency Medicine | Admitting: Emergency Medicine

## 2021-10-27 ENCOUNTER — Emergency Department: Payer: Medicaid Other

## 2021-10-27 ENCOUNTER — Other Ambulatory Visit: Payer: Self-pay

## 2021-10-27 DIAGNOSIS — R531 Weakness: Secondary | ICD-10-CM

## 2021-10-27 DIAGNOSIS — N189 Chronic kidney disease, unspecified: Secondary | ICD-10-CM | POA: Insufficient documentation

## 2021-10-27 DIAGNOSIS — I952 Hypotension due to drugs: Secondary | ICD-10-CM | POA: Insufficient documentation

## 2021-10-27 DIAGNOSIS — I129 Hypertensive chronic kidney disease with stage 1 through stage 4 chronic kidney disease, or unspecified chronic kidney disease: Secondary | ICD-10-CM | POA: Insufficient documentation

## 2021-10-27 LAB — COMPREHENSIVE METABOLIC PANEL
ALT: 10 U/L (ref 0–44)
AST: 15 U/L (ref 15–41)
Albumin: 3.5 g/dL (ref 3.5–5.0)
Alkaline Phosphatase: 113 U/L (ref 38–126)
Anion gap: 9 (ref 5–15)
BUN: 21 mg/dL — ABNORMAL HIGH (ref 6–20)
CO2: 22 mmol/L (ref 22–32)
Calcium: 8.8 mg/dL — ABNORMAL LOW (ref 8.9–10.3)
Chloride: 106 mmol/L (ref 98–111)
Creatinine, Ser: 1.57 mg/dL — ABNORMAL HIGH (ref 0.44–1.00)
GFR, Estimated: 40 mL/min — ABNORMAL LOW (ref >60)
Glucose, Bld: 107 mg/dL — ABNORMAL HIGH (ref 70–99)
Potassium: 3.6 mmol/L (ref 3.5–5.1)
Sodium: 137 mmol/L (ref 135–145)
Total Bilirubin: 0.6 mg/dL (ref 0.3–1.2)
Total Protein: 7 g/dL (ref 6.5–8.1)

## 2021-10-27 LAB — APTT: aPTT: 32 seconds (ref 24–36)

## 2021-10-27 LAB — CBC
HCT: 36.3 % (ref 36.0–46.0)
Hemoglobin: 12 g/dL (ref 12.0–15.0)
MCH: 32.2 pg (ref 26.0–34.0)
MCHC: 33.1 g/dL (ref 30.0–36.0)
MCV: 97.3 fL (ref 80.0–100.0)
Platelets: 221 10*3/uL (ref 150–400)
RBC: 3.73 MIL/uL — ABNORMAL LOW (ref 3.87–5.11)
RDW: 15.3 % (ref 11.5–15.5)
WBC: 8.4 10*3/uL (ref 4.0–10.5)
nRBC: 0 % (ref 0.0–0.2)

## 2021-10-27 LAB — DIFFERENTIAL
Abs Immature Granulocytes: 0.01 10*3/uL (ref 0.00–0.07)
Basophils Absolute: 0 10*3/uL (ref 0.0–0.1)
Basophils Relative: 1 %
Eosinophils Absolute: 0.1 10*3/uL (ref 0.0–0.5)
Eosinophils Relative: 1 %
Immature Granulocytes: 0 %
Lymphocytes Relative: 18 %
Lymphs Abs: 1.5 10*3/uL (ref 0.7–4.0)
Monocytes Absolute: 0.3 10*3/uL (ref 0.1–1.0)
Monocytes Relative: 4 %
Neutro Abs: 6.4 10*3/uL (ref 1.7–7.7)
Neutrophils Relative %: 76 %

## 2021-10-27 LAB — PROTIME-INR
INR: 1.1 (ref 0.8–1.2)
Prothrombin Time: 13.9 seconds (ref 11.4–15.2)

## 2021-10-27 MED ORDER — SODIUM CHLORIDE 0.9% FLUSH
3.0000 mL | Freq: Once | INTRAVENOUS | Status: AC
  Administered 2021-10-27: 3 mL via INTRAVENOUS

## 2021-10-27 NOTE — ED Notes (Signed)
Signing pad did not work, pt verbalized understanding of DC instructions. 

## 2021-10-27 NOTE — ED Provider Notes (Signed)
[facility] Provider Note   Event Date/Time   First MD Initiated Contact with Patient 10/27/21 1732     (approximate) History  arm numbness  HPI Gabrielle Schultz is a 51 y.o. female with a stated past medical history of hypertension, recurrent CVAs, hypertension, CKD, obesity, and migraines who presents for left arm numbness, heaviness, and weakness that lasted approximately 15 minutes and resolved spontaneously.  Patient states that this is similar to her recent stroke symptoms on 08/2021.  Patient also relates that when she took her blood pressure this morning she found it to be 119/59.  Patient states that she has had a recent change in her blood pressure medication regimen and is concerned that is dropping her blood pressure too low.  Patient denies having any subsequent episodes of the symptoms since this occurrence. ROS: Patient currently denies any vision changes, tinnitus, difficulty speaking, facial droop, sore throat, chest pain, shortness of breath, abdominal pain, nausea/vomiting/diarrhea, dysuria, or weakness/numbness/paresthesias in any extremity   Physical Exam  Triage Vital Signs: ED Triage Vitals  Enc Vitals Group     BP 10/27/21 1724 (!) 153/84     Pulse Rate 10/27/21 1724 63     Resp 10/27/21 1724 18     Temp 10/27/21 1724 98.1 F (36.7 C)     Temp src --      SpO2 10/27/21 1724 100 %     Weight --      Height --      Head Circumference --      Peak Flow --      Pain Score 10/27/21 1722 0     Pain Loc --      Pain Edu? --      Excl. in [place]? --    Most recent vital signs: Vitals:   10/27/21 1724  BP: (!) 153/84  Pulse: 63  Resp: 18  Temp: 98.1 F (36.7 C)  SpO2: 100%   General: Awake, oriented x4. CV:  Good peripheral perfusion.  Resp:  Normal effort.  Abd:  No distention.  Other:  Middle-aged overweight Caucasian female laying in bed in no acute distress.  NIHSS is 0 ED Results / Procedures / Treatments  Labs (all labs ordered  are listed, but only abnormal results are displayed) Labs Reviewed  CBC - Abnormal; Notable for the following components:      Result Value   RBC 3.73 (*)    All other components within normal limits  COMPREHENSIVE METABOLIC PANEL - Abnormal; Notable for the following components:   Glucose, Bld 107 (*)    BUN 21 (*)    Creatinine, Ser 1.57 (*)    Calcium 8.8 (*)    GFR, Estimated 40 (*)    All other components within normal limits  PROTIME-INR  APTT  DIFFERENTIAL  ETHANOL  I-STAT CREATININE, ED  CBG MONITORING, ED   EKG ED ECG REPORT I, Naaman Plummer, the attending physician, personally viewed and interpreted this ECG. Date: 10/27/2021 EKG Time: 1726 Rate: 70 Rhythm: normal sinus rhythm QRS Axis: normal Intervals: normal ST/T Wave abnormalities: normal Narrative Interpretation: no evidence of acute ischemia RADIOLOGY ED MD interpretation: CT of the head without contrast interpreted by me shows no evidence of acute abnormalities including no intracerebral hemorrhage, obvious masses, or significant edema.  There there is evidence of remote infarcts -Agree with radiology assessment Official radiology report(s): CT HEAD WO CONTRAST  Result Date: 10/27/2021 CLINICAL DATA:  numbness EXAM: CT HEAD WITHOUT  CONTRAST TECHNIQUE: Contiguous axial images were obtained from the base of the skull through the vertex without intravenous contrast. RADIATION DOSE REDUCTION: This exam was performed according to the departmental dose-optimization program which includes automated exposure control, adjustment of the mA and/or kV according to patient size and/or use of iterative reconstruction technique. COMPARISON:  None Available. FINDINGS: Brain: Similar remote left PCA territory infarct with ex vacuo ventricular dilation in this region. Similar prior high right parietal infarct. Similar remote left cerebellar infarct. No evidence of new/interval acute large vascular territory infarct. No evidence  of acute hemorrhage, mass lesion, midline shift or hydrocephalus. Vascular: No hyperdense vessel identified. Skull: No acute fracture. Sinuses/Orbits: Clear sinuses.  No acute orbital findings. Other: No mastoid effusions. IMPRESSION: 1. No evidence of acute intracranial abnormality. 2. Remote infarcts. Electronically Signed   By: Margaretha Sheffield M.D.   On: 10/27/2021 18:02   PROCEDURES: Critical Care performed: No Procedures MEDICATIONS ORDERED IN ED: Medications  sodium chloride flush (NS) 0.9 % injection 3 mL (3 mLs Intravenous Given 10/27/21 1734)   IMPRESSION / MDM / ASSESSMENT AND PLAN / ED COURSE  I reviewed the triage vital signs and the nursing notes.                             The patient is on the cardiac monitor to evaluate for evidence of arrhythmia and/or significant heart rate changes. Patient's presentation is most consistent with acute presentation with potential threat to life or bodily function. Presents with sensation of numbness and heaviness to left upper extremity.  Patients symptoms and work-up not consistent with a stroke and therefore they will be discharged from the ED.  Patient has currently been stabilized in the emergency department.  Patient received an head CT that was negative for stroke. Given recent change in patient's medication regimen, concern for possible hypotension related recurrence of her stroke stroke symptoms.  Will recommend changing clonidine to twice daily dosing and monitor for blood pressure improvement Patients symptoms not typical for other emergent causes such as dissection, infection, DKA, trauma. Patient will be discharged with strict return precautions and follow up with primary MD within 24 hours for further evaluation.   FINAL CLINICAL IMPRESSION(S) / ED DIAGNOSES   Final diagnoses:  Left-sided weakness  Hypotension due to drugs   Rx / DC Orders   ED Discharge Orders     None      Note:  This document was prepared  using Dragon voice recognition software and may include unintentional dictation errors.   Naaman Plummer, MD 10/27/21 1910

## 2021-10-27 NOTE — ED Triage Notes (Signed)
Pt comes with c/o left arm numbness. Pt states her arm feels heavy. Pt states she took her meds earlier for her cough and then all this started.   Pt denies any CP. Pt denies any dizziness or blurry vision.

## 2021-11-02 ENCOUNTER — Other Ambulatory Visit: Payer: Self-pay | Admitting: Gerontology

## 2021-11-02 ENCOUNTER — Other Ambulatory Visit: Payer: Self-pay

## 2021-11-02 DIAGNOSIS — R600 Localized edema: Secondary | ICD-10-CM

## 2021-11-02 DIAGNOSIS — I10 Essential (primary) hypertension: Secondary | ICD-10-CM

## 2021-11-02 DIAGNOSIS — Z889 Allergy status to unspecified drugs, medicaments and biological substances status: Secondary | ICD-10-CM

## 2021-11-02 DIAGNOSIS — J3089 Other allergic rhinitis: Secondary | ICD-10-CM

## 2021-11-02 DIAGNOSIS — R051 Acute cough: Secondary | ICD-10-CM

## 2021-11-02 NOTE — Progress Notes (Unsigned)
Cardiology Office Note  Date:  11/03/2021   ID:  Gabrielle Schultz, DOB 02/28/1970, MRN [id number]  PCP:  Langston Reusing, NP   Chief Complaint  Patient presents with   [facility] follow up; aortic valve disorder    Patient c/o cough & congestion, palpitations and shortness of breath. Medications reviewed by the patient verbally.     HPI:  Gabrielle Schultz is a 51 year old woman with past medical history of Hypertension Recurrent strokes, August 2023, October 2023 Chronic kidney disease hospital July 2013 for headache, nausea, right side facial tingling and numbness, found to have severe hypertension.   History of stroke  Smoker, <1/2 ppd  \CRI Who presents by referral from primary care for aortic valve disorder  Echocardiogram August 20, 2021 Bubble study negative, normal ejection fraction, moderate aortic valve insufficiency  Recent ZIO monitor with no atrial fibrillation  Dr. Loretta Plume requesting loop for recurrent CVAs  She reports that she is applying for Medicaid/disability She is receiving large bills from the hospital , one recently for $40,000, another for 20,000  Denies significant shortness of breath on exertion  Prior echocardiograms reviewed with moderate aortic valve regurgitation dating back 13 to 15 years  He has pulled up and reviewed in the office today, challenging images even the TEE  Chronic renal sufficiency creatinine 1.5 dating back several years  EKG personally reviewed by myself on todays visit Sinus bradycardia rate 54 bpm nonspecific ST abnormality, PVCs  PMH:   has a past medical history of Acid reflux, Allergic rhinitis (06/26/2015), Allergy, Asthma, Carpal tunnel syndrome, Chronic kidney disease, Colitis, CVA (cerebral infarction), Gout, History of syncope, Hypertension (2008), Low HDL (under 40) (06/26/2015), MI (myocardial infarction) ([place]), Migraines, Poor circulation, Renal insufficiency, Stroke (Gabrielle Schultz), and Syncope and collapse.  PSH:    Past Surgical  History:  Procedure Laterality Date   BUBBLE STUDY  12/03/2019   Procedure: BUBBLE STUDY;  Surgeon: Geralynn Rile, MD;  Location: [place];  Service: Cardiovascular;;   CARDIAC CATHETERIZATION     CESAREAN SECTION  1994   Dr. Ammie Dalton   COLONOSCOPY WITH PROPOFOL N/A 06/26/2014   Procedure: COLONOSCOPY WITH PROPOFOL;  Surgeon: Josefine Class, MD;  Location: [facility] ENDOSCOPY;  Service: Endoscopy;  Laterality: N/A;   DILATION AND CURETTAGE OF UTERUS  x2 for incomplete SABs   TEE WITHOUT CARDIOVERSION N/A 12/03/2019   Procedure: TRANSESOPHAGEAL ECHOCARDIOGRAM (TEE);  Surgeon: Geralynn Rile, MD;  Location: [place];  Service: Cardiovascular;  Laterality: N/A;    Current Outpatient Medications  Medication Sig Dispense Refill   acetaminophen (TYLENOL) 500 MG tablet Take 500 mg by mouth every 6 (six) hours as needed for fever or mild pain.     albuterol (PROVENTIL HFA) 108 (90 Base) MCG/ACT inhaler Inhale 2 puffs into the lungs once every 4 (four) hours as needed for wheezing (cough). 6.7 g 2   allopurinol (ZYLOPRIM) 100 MG tablet TAKE ONE TABLET BY MOUTH ONCE DAILY. 30 tablet 11   aspirin EC 81 MG tablet Take 1 tablet (81 mg total) by mouth once daily. Swallow whole. 90 tablet 0   atorvastatin (LIPITOR) 80 MG tablet Take 1 tablet (80 mg total) by mouth daily. 90 tablet 1   benzonatate (TESSALON PERLES) 100 MG capsule Take 1 capsule (100 mg total) by mouth 3 (three) times daily as needed. 20 capsule 0   Blood Pressure KIT 1 kit by Does not apply route daily at 2 PM. 1 kit 0   cetirizine (ZYRTEC) 10 MG tablet  TAKE ONE TABLET BY MOUTH ONCE DAILY. 90 tablet 0   cloNIDine (CATAPRES) 0.3 MG tablet Take 1 tablet by mouth 3 times daily (morning, noon, and evening). 90 tablet 11   clopidogrel (PLAVIX) 75 MG tablet Take 1 tablet (75 mg total) by mouth once daily. 90 tablet 1   fluticasone (FLONASE) 50 MCG/ACT nasal spray USE 1 SPRAY INTO BOTH NOSTRILS 2 TIMES A DAY 16 g 0    fluticasone-salmeterol (ADVAIR DISKUS) 250-50 MCG/ACT AEPB Inhale 1 puff into the lungs in the morning and at bedtime. 60 each 2   fluticasone-salmeterol (WIXELA INHUB) 250-50 MCG/ACT AEPB Inhale one puff into the lungs in the morning and at bedtime 180 each 3   furosemide (LASIX) 20 MG tablet Take 20 mg by mouth daily as needed.     lidocaine (XYLOCAINE) 5 % ointment Apply 1 application to the affected area(s) topically once daily as needed. 35.44 g 0   losartan (COZAAR) 25 MG tablet Take 25 mg by mouth daily.     omeprazole (PRILOSEC) 20 MG capsule TAKE ONE CAPSULE BY MOUTH ONCE DAILY. 90 capsule 0   potassium chloride (KLOR-CON M) 10 MEQ tablet Take 1 tablet (10 mEq total) by mouth as needed (potassium). 10 tablet 0   promethazine (PHENERGAN) 25 MG tablet TAKE 1 TABLET BY MOUTH ONCE EVERY 6 HOURS AS NEEDED FOR NAUSEA OR VOMITING. 15 tablet 0   No current facility-administered medications for this visit.     Allergies:   Coconut (cocos nucifera), Amoxicillin, Penicillins, and Singulair [montelukast sodium]   Social History:  The patient  reports that she has been smoking cigarettes. She started smoking about 4 years ago. She has a 16.50 pack-year smoking history. She has never used smokeless tobacco. She reports current alcohol use. She reports that she does not use drugs.   Family History:   family history includes COPD in her mother; Coronary artery disease in her brother; Diabetes in her child; Healthy in her child; Heart disease in her brother, father, and mother; Hyperlipidemia in her brother, father, and mother; Hypertension in her brother, father, and sister; Kidney disease in her sister.    Review of Systems: Review of Systems  Constitutional: Negative.   HENT: Negative.    Respiratory: Negative.    Cardiovascular:  Positive for palpitations.  Gastrointestinal: Negative.   Musculoskeletal: Negative.   Neurological: Negative.   Psychiatric/Behavioral: Negative.    All other  systems reviewed and are negative.   PHYSICAL EXAM: VS:  BP (!) 140/52 (BP Location: Left Arm, Patient Position: Sitting, Cuff Size: Normal)   Pulse (!) 54   Ht _0  (1.549 m)   Wt 159 lb (72.1 kg)   SpO2 98%   BMI 30.04 kg/m  , BMI Body mass index is 30.04 kg/m. Constitutional:  oriented to person, place, and time. No distress.  HENT:  Head: Grossly normal Eyes:  no discharge. No scleral icterus.  Neck: No JVD, no carotid bruits  Cardiovascular: Regular rate and rhythm, no murmurs appreciated Pulmonary/Chest: Clear to auscultation bilaterally, no wheezes or rails Abdominal: Soft.  no distension.  no tenderness.  Musculoskeletal: Normal range of motion Neurological:  normal muscle tone. Coordination normal. No atrophy Skin: Skin warm and dry Psychiatric: normal affect, pleasant   Recent Labs: 08/19/2021: TSH 1.356 10/27/2021: ALT 10; BUN 21; Creatinine, Ser 1.57; Hemoglobin 12.0; Platelets 221; Potassium 3.6; Sodium 137    Lipid Panel Lab Results  Component Value Date   CHOL 123 08/20/2021   HDL 19 (  L) 08/20/2021   LDLCALC 78 08/20/2021   TRIG 128 08/20/2021      Wt Readings from Last 3 Encounters:  11/03/21 159 lb (72.1 kg)  10/22/21 161 lb 14.4 oz (73.4 kg)  09/24/21 161 lb 13.1 oz (73.4 kg)     ASSESSMENT AND PLAN:  Problem List Items Addressed This Visit       Cardiology Problems   Hypertension   Relevant Medications   losartan (COZAAR) 25 MG tablet   furosemide (LASIX) 20 MG tablet   Other Relevant Orders   EKG 12-Lead   CVA (cerebral vascular accident) (Normal)   Relevant Medications   losartan (COZAAR) 25 MG tablet   furosemide (LASIX) 20 MG tablet   Other Relevant Orders   EKG 12-Lead   Aortic regurgitation - Primary   Relevant Medications   losartan (COZAAR) 25 MG tablet   furosemide (LASIX) 20 MG tablet   Other Relevant Orders   EKG 12-Lead   Hyperlipidemia LDL goal <70   Relevant Medications   losartan (COZAAR) 25 MG tablet    furosemide (LASIX) 20 MG tablet   Other Relevant Orders   EKG 12-Lead   History of strokes Recent Zio monitor with no atrial fibrillation noted Neuro as he had recommended a loop monitor She reports that she has no insurance coverage though applying for Medicaid/disability Recent bills for 20 and $40,000 from hospitalizations Recommended we consider waiting until Medicaid goes through then meet with Dr. Quentin Ore for loop monitor implant Blood pressure well controlled, cholesterol controlled, on aspirin Plavix  Aortic valve regurgitation Unable to exclude bicuspid valve etiology Mild to moderate, sometimes moderate aortic valve regurgitation dating back to 2008, appears relatively stable, no LV dilatation, normal LV function No further work-up needed at this time Plan to monitor with periodic echocardiograms likely will be needed every other year  Hyperlipidemia Cholesterol is at goal on the current lipid regimen. No changes to the medications were made.  Essential hypertension Blood pressure is well controlled on today's visit. No changes made to the medications.    Total encounter time more than 50 minutes  Greater than 50% was spent in counseling and coordination of care with the patient    Signed, Esmond Plants, M.D., Ph.D. [place], [place]

## 2021-11-03 ENCOUNTER — Other Ambulatory Visit: Payer: Self-pay | Admitting: Gerontology

## 2021-11-03 ENCOUNTER — Other Ambulatory Visit: Payer: Self-pay

## 2021-11-03 ENCOUNTER — Encounter: Payer: Self-pay | Admitting: Cardiovascular Disease

## 2021-11-03 ENCOUNTER — Ambulatory Visit: Payer: Self-pay | Attending: Cardiovascular Disease | Admitting: Cardiovascular Disease

## 2021-11-03 VITALS — BP 140/52 | HR 54 | Ht 61.0 in | Wt 159.0 lb

## 2021-11-03 DIAGNOSIS — J3089 Other allergic rhinitis: Secondary | ICD-10-CM

## 2021-11-03 DIAGNOSIS — R110 Nausea: Secondary | ICD-10-CM

## 2021-11-03 DIAGNOSIS — I10 Essential (primary) hypertension: Secondary | ICD-10-CM

## 2021-11-03 DIAGNOSIS — R600 Localized edema: Secondary | ICD-10-CM

## 2021-11-03 DIAGNOSIS — Z889 Allergy status to unspecified drugs, medicaments and biological substances status: Secondary | ICD-10-CM

## 2021-11-03 DIAGNOSIS — R051 Acute cough: Secondary | ICD-10-CM

## 2021-11-03 DIAGNOSIS — E785 Hyperlipidemia, unspecified: Secondary | ICD-10-CM

## 2021-11-03 DIAGNOSIS — I639 Cerebral infarction, unspecified: Secondary | ICD-10-CM

## 2021-11-03 DIAGNOSIS — J4541 Moderate persistent asthma with (acute) exacerbation: Secondary | ICD-10-CM

## 2021-11-03 DIAGNOSIS — I351 Nonrheumatic aortic (valve) insufficiency: Secondary | ICD-10-CM

## 2021-11-03 MED ORDER — ALBUTEROL SULFATE HFA 108 (90 BASE) MCG/ACT IN AERS
2.0000 | INHALATION_SPRAY | RESPIRATORY_TRACT | 2 refills | Status: DC | PRN
  Filled 2021-11-03: qty 6.7, 25d supply, fill #0
  Filled 2021-12-27 – 2021-12-29 (×2): qty 6.7, 25d supply, fill #1

## 2021-11-03 MED ORDER — PROMETHAZINE HCL 25 MG PO TABS
ORAL_TABLET | ORAL | 0 refills | Status: DC
  Filled 2021-11-03: qty 15, 4d supply, fill #0

## 2021-11-03 MED FILL — Fluticasone Propionate Nasal Susp 50 MCG/ACT: NASAL | 30 days supply | Qty: 16 | Fill #0 | Status: AC

## 2021-11-03 NOTE — Patient Instructions (Addendum)
Medication Instructions:  No changes  If you need a refill on your cardiac medications before your next appointment, please call your pharmacy.   Lab work: No new labs needed  Testing/Procedures: No new testing needed  Follow-Up: At [facility], you and your health needs are our priority.  As part of our continuing mission to provide you with exceptional heart care, we have created designated Provider Care Teams.  These Care Teams include your primary Cardiologist (physician) and Advanced Practice Providers (APPs -  Physician Assistants and Nurse Practitioners) who all work together to provide you with the care you need, when you need it.  You will need a follow up appointment in 12 months  Providers on your designated Care Team:   Christopher Berge, NP Ryan Dunn, PA-C Cadence Furth, PA-C  COVID-19 Vaccine Information can be found at: [email or web address] For questions related to vaccine distribution or appointments, please email [email or web address] or call [phone].   

## 2021-11-05 ENCOUNTER — Other Ambulatory Visit: Payer: Self-pay

## 2021-11-10 ENCOUNTER — Encounter: Payer: Medicaid Other | Admitting: Obstetrics and Gynecology

## 2021-11-17 ENCOUNTER — Ambulatory Visit: Payer: Medicaid Other | Admitting: Gerontology

## 2021-11-18 ENCOUNTER — Ambulatory Visit: Payer: Medicaid Other | Admitting: Gerontology

## 2021-11-19 ENCOUNTER — Ambulatory Visit: Payer: Medicaid Other | Admitting: Gerontology

## 2021-11-19 VITALS — BP 143/80 | HR 73 | Temp 98.2°F | Resp 16 | Ht 61.0 in | Wt 162.2 lb

## 2021-11-19 DIAGNOSIS — I10 Essential (primary) hypertension: Secondary | ICD-10-CM

## 2021-11-19 DIAGNOSIS — J3089 Other allergic rhinitis: Secondary | ICD-10-CM

## 2021-11-19 MED ORDER — CETIRIZINE HCL 10 MG PO TABS
ORAL_TABLET | Freq: Every day | ORAL | 0 refills | Status: DC
  Filled 2021-11-19: qty 90, 90d supply, fill #0

## 2021-11-19 MED ORDER — ASPIRIN 81 MG PO TBEC
81.0000 mg | DELAYED_RELEASE_TABLET | Freq: Every day | ORAL | 0 refills | Status: DC
  Filled 2021-11-19: qty 90, 90d supply, fill #0

## 2021-11-19 NOTE — Patient Instructions (Signed)

## 2021-11-19 NOTE — Progress Notes (Signed)
Established Patient Office Visit  Subjective   Patient ID: Gabrielle Schultz, female    DOB: [birth year]  Age: 51 y.o. MRN: [id number]  Chief Complaint  Patient presents with   Shortness of Breath   Chest Pain    Both SOB and chest pain today while sitting in car since 10:30 a.m.; took inhaler and was ineffective.      HPI  Gabrielle Schultz is a 51 y.o. female who  has a past medical history of Acid reflux, Allergic rhinitis (06/26/2015), Allergy, Asthma, Carpal tunnel syndrome, Chronic kidney disease, Colitis, CVA (cerebral infarction), Gout, History of syncope, Hypertension (2008), Low HDL (under 40) (06/26/2015), MI (myocardial infarction) ([place]), Migraines, Poor circulation, Renal insufficiency, Stroke ([place]), and Syncope presents for routine follow up visit. She states that she continues to take 20 mg Furosemide daily and has not been coughing.  She states that she experienced left sided chest pain that started 10:30 am this morning while she was sitting in the care waiting for her grand daughter. She describes pain as constant dull 5/10 and non radiating. She states pain was associated with intermittent shortness of breath and taking Albuterol moderately relieve symptoms. Currently ,she denies chest pain during visit. She brought her blood pressure log and it ranges between 134- 147/ 70- 86 with 2 readings of 160/89. Overall, she states that she's doing well and offers no further discomfort.  Review of Systems  Constitutional: Negative.   Eyes: Negative.   Respiratory: Negative.    Cardiovascular: Negative.   Neurological: Negative.   Psychiatric/Behavioral: Negative.        Objective:     BP (!) 143/80   Pulse 73   Temp 98.2 F (36.8 C)   Resp 16   Ht _0  (1.549 m)   Wt 162 lb 3.2 oz (73.6 kg)   SpO2 98%   BMI 30.65 kg/m  BP Readings from Last 3 Encounters:  11/19/21 (!) 143/80  11/03/21 (!) 140/52  10/27/21 (!) 153/75   Wt Readings from Last 3 Encounters:  11/19/21 162  lb 3.2 oz (73.6 kg)  11/03/21 159 lb (72.1 kg)  10/22/21 161 lb 14.4 oz (73.4 kg)      Physical Exam HENT:     Head: Normocephalic and atraumatic.     Mouth/Throat:     Mouth: Mucous membranes are moist.  Eyes:     Extraocular Movements: Extraocular movements intact.     Pupils: Pupils are equal, round, and reactive to light.  Cardiovascular:     Rate and Rhythm: Normal rate and regular rhythm.     Pulses: Normal pulses.     Heart sounds: Normal heart sounds.  Pulmonary:     Effort: Pulmonary effort is normal.     Breath sounds: Normal breath sounds.  Musculoskeletal:     Right lower leg: No edema.     Left lower leg: No edema.  Skin:    General: Skin is warm.  Neurological:     General: No focal deficit present.     Mental Status: She is alert and oriented to person, place, and time. Mental status is at baseline.  Psychiatric:        Mood and Affect: Mood normal.        Behavior: Behavior normal.      No results found for any visits on 11/19/21.  Last CBC Lab Results  Component Value Date   WBC 8.4 10/27/2021   HGB 12.0 10/27/2021   HCT 36.3 10/27/2021  MCV 97.3 10/27/2021   MCH 32.2 10/27/2021   RDW 15.3 10/27/2021   PLT 221 77/11/6577   Last metabolic panel Lab Results  Component Value Date   GLUCOSE 107 (H) 10/27/2021   NA 137 10/27/2021   K 3.6 10/27/2021   CL 106 10/27/2021   CO2 22 10/27/2021   BUN 21 (H) 10/27/2021   CREATININE 1.57 (H) 10/27/2021   GFRNONAA 40 (L) 10/27/2021   CALCIUM 8.8 (L) 10/27/2021   PHOS 3.7 07/31/2015   PROT 7.0 10/27/2021   ALBUMIN 3.5 10/27/2021   LABGLOB 2.4 08/13/2021   AGRATIO 1.6 08/13/2021   BILITOT 0.6 10/27/2021   ALKPHOS 113 10/27/2021   AST 15 10/27/2021   ALT 10 10/27/2021   ANIONGAP 9 10/27/2021   Last lipids Lab Results  Component Value Date   CHOL 123 08/20/2021   HDL 19 (L) 08/20/2021   LDLCALC 78 08/20/2021   TRIG 128 08/20/2021   CHOLHDL 6.5 08/20/2021   Last hemoglobin A1c Lab  Results  Component Value Date   HGBA1C 5.7 (H) 05/21/2021   Last thyroid functions Lab Results  Component Value Date   TSH 1.356 08/19/2021   T4TOTAL 8.1 03/16/2016      The ASCVD Risk score (Arnett DK, et al., 2019) failed to calculate for the following reasons:   The patient has a prior MI or stroke diagnosis    Assessment & Plan:   1. Primary hypertension - She states that chest pain has resolved, and was advised to goto the ED for subsequent chest pain.Her blood pressure is improving, she will continue on current medication, DASH diet and exercise as tolerated. She continued on Furosemide, will check potasium, and renal function. - aspirin EC 81 MG tablet; Take 1 tablet (81 mg total) by mouth once daily. Swallow whole.  Dispense: 90 tablet; Refill: 0 - Basic Metabolic Panel (BMET); Future - Comp Met (CMET); Future - Comp Met (CMET) - Basic Metabolic Panel (BMET)  2. Allergic rhinitis due to other allergic trigger, unspecified seasonality - She will continue on current medication. - cetirizine (ZYRTEC) 10 MG tablet; TAKE ONE TABLET BY MOUTH ONCE DAILY.  Dispense: 90 tablet; Refill: 0   Return in about 9 weeks (around 01/21/2022), or if symptoms worsen or fail to improve.    Howell Groesbeck Jerold Coombe, NP

## 2021-11-20 ENCOUNTER — Other Ambulatory Visit: Payer: Self-pay

## 2021-11-20 LAB — COMPREHENSIVE METABOLIC PANEL
ALT: 7 IU/L (ref 0–32)
AST: 10 IU/L (ref 0–40)
Albumin/Globulin Ratio: 1.5 (ref 1.2–2.2)
Albumin: 3.7 g/dL — ABNORMAL LOW (ref 3.9–4.9)
Alkaline Phosphatase: 138 IU/L — ABNORMAL HIGH (ref 44–121)
BUN/Creatinine Ratio: 12 (ref 9–23)
BUN: 20 mg/dL (ref 6–24)
Bilirubin Total: <0.2 mg/dL (ref 0.0–1.2)
CO2: 24 mmol/L (ref 20–29)
Calcium: 8.7 mg/dL (ref 8.7–10.2)
Chloride: 106 mmol/L (ref 96–106)
Creatinine, Ser: 1.62 mg/dL — ABNORMAL HIGH (ref 0.57–1.00)
Globulin, Total: 2.4 g/dL (ref 1.5–4.5)
Glucose: 101 mg/dL — ABNORMAL HIGH (ref 70–99)
Potassium: 4.1 mmol/L (ref 3.5–5.2)
Sodium: 141 mmol/L (ref 134–144)
Total Protein: 6.1 g/dL (ref 6.0–8.5)
eGFR: 39 mL/min/{1.73_m2} — ABNORMAL LOW (ref >59)

## 2021-11-23 ENCOUNTER — Other Ambulatory Visit: Payer: Self-pay

## 2021-11-24 ENCOUNTER — Other Ambulatory Visit: Payer: Self-pay | Admitting: Gerontology

## 2021-11-24 DIAGNOSIS — R051 Acute cough: Secondary | ICD-10-CM

## 2021-11-24 NOTE — Progress Notes (Signed)
Ms. Meara was notified that Furosemide 20 mg will not be ordered because of her declining renal function, serum creatinine was 1.62 mg/dl and eGFR was 39. She has Nephrology appointment on 01/19/22. She states that her cough is resolved with taking Furosemide. Chest X ray was ordered. She was advised to increase fluid intake and have chest X ray done and not to take Furosemide. verbalized understanding.

## 2021-11-30 ENCOUNTER — Other Ambulatory Visit: Payer: Self-pay

## 2021-11-30 ENCOUNTER — Other Ambulatory Visit: Payer: Self-pay | Admitting: Gerontology

## 2021-11-30 DIAGNOSIS — I10 Essential (primary) hypertension: Secondary | ICD-10-CM

## 2021-12-01 ENCOUNTER — Other Ambulatory Visit: Payer: Self-pay

## 2021-12-01 MED FILL — Losartan Potassium Tab 50 MG: ORAL | 30 days supply | Qty: 30 | Fill #0 | Status: AC

## 2021-12-02 ENCOUNTER — Encounter: Payer: Self-pay | Admitting: Emergency Medicine

## 2021-12-02 ENCOUNTER — Emergency Department: Payer: Self-pay

## 2021-12-02 DIAGNOSIS — Z1152 Encounter for screening for COVID-19: Secondary | ICD-10-CM | POA: Insufficient documentation

## 2021-12-02 DIAGNOSIS — R778 Other specified abnormalities of plasma proteins: Secondary | ICD-10-CM | POA: Insufficient documentation

## 2021-12-02 DIAGNOSIS — E785 Hyperlipidemia, unspecified: Secondary | ICD-10-CM | POA: Insufficient documentation

## 2021-12-02 DIAGNOSIS — J019 Acute sinusitis, unspecified: Secondary | ICD-10-CM | POA: Insufficient documentation

## 2021-12-02 DIAGNOSIS — Z8679 Personal history of other diseases of the circulatory system: Secondary | ICD-10-CM | POA: Insufficient documentation

## 2021-12-02 DIAGNOSIS — J45909 Unspecified asthma, uncomplicated: Secondary | ICD-10-CM | POA: Insufficient documentation

## 2021-12-02 DIAGNOSIS — I129 Hypertensive chronic kidney disease with stage 1 through stage 4 chronic kidney disease, or unspecified chronic kidney disease: Secondary | ICD-10-CM | POA: Insufficient documentation

## 2021-12-02 DIAGNOSIS — N1832 Chronic kidney disease, stage 3b: Secondary | ICD-10-CM | POA: Insufficient documentation

## 2021-12-02 DIAGNOSIS — F17210 Nicotine dependence, cigarettes, uncomplicated: Secondary | ICD-10-CM | POA: Insufficient documentation

## 2021-12-02 DIAGNOSIS — Z7982 Long term (current) use of aspirin: Secondary | ICD-10-CM | POA: Insufficient documentation

## 2021-12-02 DIAGNOSIS — R0789 Other chest pain: Principal | ICD-10-CM | POA: Insufficient documentation

## 2021-12-02 DIAGNOSIS — Z7951 Long term (current) use of inhaled steroids: Secondary | ICD-10-CM | POA: Insufficient documentation

## 2021-12-02 DIAGNOSIS — I2510 Atherosclerotic heart disease of native coronary artery without angina pectoris: Secondary | ICD-10-CM | POA: Insufficient documentation

## 2021-12-02 DIAGNOSIS — Z7902 Long term (current) use of antithrombotics/antiplatelets: Secondary | ICD-10-CM | POA: Insufficient documentation

## 2021-12-02 DIAGNOSIS — Z79899 Other long term (current) drug therapy: Secondary | ICD-10-CM | POA: Insufficient documentation

## 2021-12-02 DIAGNOSIS — J449 Chronic obstructive pulmonary disease, unspecified: Secondary | ICD-10-CM | POA: Insufficient documentation

## 2021-12-02 LAB — CBC
HCT: 35.3 % — ABNORMAL LOW (ref 36.0–46.0)
Hemoglobin: 11.6 g/dL — ABNORMAL LOW (ref 12.0–15.0)
MCH: 33.2 pg (ref 26.0–34.0)
MCHC: 32.9 g/dL (ref 30.0–36.0)
MCV: 101.1 fL — ABNORMAL HIGH (ref 80.0–100.0)
Platelets: 224 10*3/uL (ref 150–400)
RBC: 3.49 MIL/uL — ABNORMAL LOW (ref 3.87–5.11)
RDW: 16.6 % — ABNORMAL HIGH (ref 11.5–15.5)
WBC: 8.8 10*3/uL (ref 4.0–10.5)
nRBC: 0 % (ref 0.0–0.2)

## 2021-12-02 NOTE — ED Triage Notes (Signed)
Pt presents via POV with complaints of left sided CP with radiation to the right that started about an hour ago. Pt took 324mg  ASA PTA with no improvement in her sx. Hx of MI in 2015. Of note, pt has a new PCP who changed her medications including D/C her lasix and K+ supplements a month ago and she has had an increase in BP issues. Denies fevers, chills, vision changes, N/V/D, SOB.

## 2021-12-03 ENCOUNTER — Observation Stay (HOSPITAL_BASED_OUTPATIENT_CLINIC_OR_DEPARTMENT_OTHER): Payer: Self-pay

## 2021-12-03 ENCOUNTER — Observation Stay
Admission: EM | Admit: 2021-12-03 | Discharge: 2021-12-03 | Disposition: A | Discharge status: Home / Self Care | Payer: Medicaid Other | Attending: Internal Medicine | Admitting: Internal Medicine

## 2021-12-03 ENCOUNTER — Observation Stay: Payer: Self-pay

## 2021-12-03 ENCOUNTER — Encounter: Payer: Self-pay | Admitting: Internal Medicine

## 2021-12-03 ENCOUNTER — Other Ambulatory Visit: Payer: Self-pay

## 2021-12-03 DIAGNOSIS — R079 Chest pain, unspecified: Secondary | ICD-10-CM

## 2021-12-03 DIAGNOSIS — R7989 Other specified abnormal findings of blood chemistry: Secondary | ICD-10-CM | POA: Diagnosis present

## 2021-12-03 DIAGNOSIS — I351 Nonrheumatic aortic (valve) insufficiency: Secondary | ICD-10-CM

## 2021-12-03 DIAGNOSIS — I639 Cerebral infarction, unspecified: Secondary | ICD-10-CM | POA: Diagnosis present

## 2021-12-03 DIAGNOSIS — N189 Chronic kidney disease, unspecified: Secondary | ICD-10-CM

## 2021-12-03 DIAGNOSIS — I10 Essential (primary) hypertension: Secondary | ICD-10-CM | POA: Diagnosis present

## 2021-12-03 DIAGNOSIS — J329 Chronic sinusitis, unspecified: Secondary | ICD-10-CM | POA: Insufficient documentation

## 2021-12-03 DIAGNOSIS — N1832 Chronic kidney disease, stage 3b: Secondary | ICD-10-CM

## 2021-12-03 DIAGNOSIS — R0789 Other chest pain: Secondary | ICD-10-CM

## 2021-12-03 DIAGNOSIS — J0110 Acute frontal sinusitis, unspecified: Secondary | ICD-10-CM

## 2021-12-03 DIAGNOSIS — J449 Chronic obstructive pulmonary disease, unspecified: Secondary | ICD-10-CM | POA: Diagnosis present

## 2021-12-03 DIAGNOSIS — E785 Hyperlipidemia, unspecified: Secondary | ICD-10-CM | POA: Diagnosis present

## 2021-12-03 DIAGNOSIS — R600 Localized edema: Secondary | ICD-10-CM

## 2021-12-03 DIAGNOSIS — F17200 Nicotine dependence, unspecified, uncomplicated: Secondary | ICD-10-CM | POA: Diagnosis present

## 2021-12-03 LAB — BASIC METABOLIC PANEL
Anion gap: 4 — ABNORMAL LOW (ref 5–15)
BUN: 24 mg/dL — ABNORMAL HIGH (ref 6–20)
CO2: 23 mmol/L (ref 22–32)
Calcium: 8.8 mg/dL — ABNORMAL LOW (ref 8.9–10.3)
Chloride: 113 mmol/L — ABNORMAL HIGH (ref 98–111)
Creatinine, Ser: 1.66 mg/dL — ABNORMAL HIGH (ref 0.44–1.00)
GFR, Estimated: 38 mL/min — ABNORMAL LOW (ref >60)
Glucose, Bld: 102 mg/dL — ABNORMAL HIGH (ref 70–99)
Potassium: 4.3 mmol/L (ref 3.5–5.1)
Sodium: 140 mmol/L (ref 135–145)

## 2021-12-03 LAB — NM MYOCAR MULTI W/SPECT W/WALL MOTION / EF
LV dias vol: 157 mL (ref 46–106)
LV sys vol: 71 mL
Nuc Stress EF: 55 %
Peak HR: 96 {beats}/min
Rest HR: 55 {beats}/min
Rest Nuclear Isotope Dose: 10.6 mCi
SDS: 1
SRS: 6
SSS: 4
ST Depression (mm): 0 mm
Stress Nuclear Isotope Dose: 30.7 mCi
TID: 1.07

## 2021-12-03 LAB — MAGNESIUM: Magnesium: 1.9 mg/dL (ref 1.7–2.4)

## 2021-12-03 LAB — TROPONIN I (HIGH SENSITIVITY)
Troponin I (High Sensitivity): 119 ng/L (ref <18)
Troponin I (High Sensitivity): 125 ng/L (ref <18)

## 2021-12-03 LAB — SARS CORONAVIRUS 2 BY RT PCR: SARS Coronavirus 2 by RT PCR: NEGATIVE

## 2021-12-03 MED ORDER — AMLODIPINE BESYLATE 5 MG PO TABS: 5.0000 mg | ORAL_TABLET | Freq: Every day | ORAL | Status: DC

## 2021-12-03 MED ORDER — CLOPIDOGREL BISULFATE 75 MG PO TABS
75.0000 mg | ORAL_TABLET | Freq: Every day | ORAL | Status: DC
  Administered 2021-12-03: 75 mg via ORAL
  Filled 2021-12-03: qty 1

## 2021-12-03 MED ORDER — CLONIDINE HCL 0.1 MG PO TABS
0.3000 mg | ORAL_TABLET | Freq: Three times a day (TID) | ORAL | Status: DC
  Administered 2021-12-03 (×2): 0.3 mg via ORAL
  Filled 2021-12-03 (×2): qty 3

## 2021-12-03 MED ORDER — DOXYCYCLINE HYCLATE 100 MG PO TABS
100.0000 mg | ORAL_TABLET | Freq: Two times a day (BID) | ORAL | 0 refills | Status: AC
  Filled 2021-12-03: qty 10, 5d supply, fill #0

## 2021-12-03 MED ORDER — PANTOPRAZOLE SODIUM 40 MG PO TBEC
40.0000 mg | DELAYED_RELEASE_TABLET | Freq: Every day | ORAL | Status: DC
  Administered 2021-12-03: 40 mg via ORAL
  Filled 2021-12-03: qty 1

## 2021-12-03 MED ORDER — ACETAMINOPHEN 500 MG PO TABS: 500.0000 mg | ORAL_TABLET | Freq: Four times a day (QID) | ORAL | Status: DC | PRN

## 2021-12-03 MED ORDER — LORATADINE 10 MG PO TABS
10.0000 mg | ORAL_TABLET | Freq: Every day | ORAL | Status: DC
  Administered 2021-12-03: 10 mg via ORAL
  Filled 2021-12-03: qty 1

## 2021-12-03 MED ORDER — DOXYCYCLINE HYCLATE 100 MG PO TABS
100.0000 mg | ORAL_TABLET | Freq: Two times a day (BID) | ORAL | Status: DC
  Administered 2021-12-03: 100 mg via ORAL
  Filled 2021-12-03: qty 1

## 2021-12-03 MED ORDER — TECHNETIUM TC 99M TETROFOSMIN IV KIT
10.0000 | PACK | Freq: Once | INTRAVENOUS | Status: AC | PRN
  Administered 2021-12-03: 10.57 via INTRAVENOUS

## 2021-12-03 MED ORDER — ALLOPURINOL 100 MG PO TABS
100.0000 mg | ORAL_TABLET | Freq: Every day | ORAL | Status: DC
  Administered 2021-12-03: 100 mg via ORAL
  Filled 2021-12-03: qty 1

## 2021-12-03 MED ORDER — MOMETASONE FURO-FORMOTEROL FUM 200-5 MCG/ACT IN AERO
2.0000 | INHALATION_SPRAY | Freq: Two times a day (BID) | RESPIRATORY_TRACT | Status: DC
  Filled 2021-12-03: qty 8.8

## 2021-12-03 MED ORDER — AMLODIPINE BESYLATE 10 MG PO TABS
10.0000 mg | ORAL_TABLET | Freq: Every day | ORAL | 0 refills | Status: DC
  Filled 2021-12-03: qty 30, 30d supply, fill #0

## 2021-12-03 MED ORDER — POTASSIUM CHLORIDE CRYS ER 10 MEQ PO TBCR
10.0000 meq | EXTENDED_RELEASE_TABLET | ORAL | 0 refills | Status: DC | PRN
  Filled 2021-12-03: qty 10, 10d supply, fill #0

## 2021-12-03 MED ORDER — FLUTICASONE PROPIONATE 50 MCG/ACT NA SUSP
2.0000 | Freq: Every day | NASAL | Status: DC
  Filled 2021-12-03: qty 16

## 2021-12-03 MED ORDER — ASPIRIN 81 MG PO TBEC
81.0000 mg | DELAYED_RELEASE_TABLET | Freq: Every day | ORAL | Status: DC
  Administered 2021-12-03: 81 mg via ORAL
  Filled 2021-12-03: qty 1

## 2021-12-03 MED ORDER — TECHNETIUM TC 99M TETROFOSMIN IV KIT
30.7000 | PACK | Freq: Once | INTRAVENOUS | Status: AC | PRN
  Administered 2021-12-03: 30.7 via INTRAVENOUS

## 2021-12-03 MED ORDER — FUROSEMIDE 40 MG PO TABS: 20.0000 mg | ORAL_TABLET | Freq: Every day | ORAL | Status: DC | PRN

## 2021-12-03 MED ORDER — FUROSEMIDE 20 MG PO TABS
20.0000 mg | ORAL_TABLET | Freq: Every day | ORAL | 0 refills | Status: DC | PRN
  Filled 2021-12-03: qty 30, 30d supply, fill #0

## 2021-12-03 MED ORDER — LOPERAMIDE HCL 2 MG PO CAPS
4.0000 mg | ORAL_CAPSULE | Freq: Once | ORAL | Status: AC
  Administered 2021-12-03: 4 mg via ORAL
  Filled 2021-12-03: qty 2

## 2021-12-03 MED ORDER — AMLODIPINE BESYLATE 5 MG PO TABS
2.5000 mg | ORAL_TABLET | Freq: Every day | ORAL | Status: DC
  Administered 2021-12-03: 2.5 mg via ORAL
  Filled 2021-12-03: qty 1

## 2021-12-03 MED ORDER — PROMETHAZINE HCL 25 MG PO TABS: 12.5000 mg | ORAL_TABLET | Freq: Four times a day (QID) | ORAL | Status: DC | PRN

## 2021-12-03 MED ORDER — POTASSIUM CHLORIDE CRYS ER 20 MEQ PO TBCR: 10.0000 meq | EXTENDED_RELEASE_TABLET | ORAL | Status: DC | PRN

## 2021-12-03 MED ORDER — ALBUTEROL SULFATE HFA 108 (90 BASE) MCG/ACT IN AERS: 2.0000 | INHALATION_SPRAY | RESPIRATORY_TRACT | Status: DC | PRN

## 2021-12-03 MED ORDER — LIDOCAINE 5 % EX OINT: 1.0000 | TOPICAL_OINTMENT | CUTANEOUS | Status: DC | PRN

## 2021-12-03 MED ORDER — ALBUTEROL SULFATE (2.5 MG/3ML) 0.083% IN NEBU: 2.5000 mg | INHALATION_SOLUTION | RESPIRATORY_TRACT | Status: DC | PRN

## 2021-12-03 MED ORDER — LOSARTAN POTASSIUM 50 MG PO TABS
50.0000 mg | ORAL_TABLET | Freq: Every day | ORAL | Status: DC
  Administered 2021-12-03: 50 mg via ORAL
  Filled 2021-12-03: qty 1

## 2021-12-03 MED ORDER — REGADENOSON 0.4 MG/5ML IV SOLN
0.4000 mg | Freq: Once | INTRAVENOUS | Status: AC
  Administered 2021-12-03: 0.4 mg via INTRAVENOUS

## 2021-12-03 MED ORDER — ATORVASTATIN CALCIUM 20 MG PO TABS
80.0000 mg | ORAL_TABLET | Freq: Every day | ORAL | Status: DC
  Administered 2021-12-03: 80 mg via ORAL
  Filled 2021-12-03: qty 4

## 2021-12-03 MED ORDER — AMLODIPINE BESYLATE 5 MG PO TABS: 2.5000 mg | ORAL_TABLET | Freq: Every day | ORAL | Status: DC

## 2021-12-03 NOTE — Assessment & Plan Note (Signed)
-  Continue Lipitor °

## 2021-12-03 NOTE — Assessment & Plan Note (Signed)
Continue aspirin and Plavix and Lipitor

## 2021-12-03 NOTE — ED Provider Notes (Signed)
[facility] Provider Note    Event Date/Time   First MD Initiated Contact with Patient 12/03/21 0045     (approximate)   History   Chest Pain   HPI  Gabrielle Schultz is a 51 y.o. female  who presents for evaluation of acute onset of left sided chest pain that radiates to the right side.  It woke her from sleep about an hour prior to arrival to the ED.  When the symptoms continued, she came to the ED. currently she has no pain.  She said that when the pain started she had shortness of breath but that also resolved.  She was told that she had an MI about 8 years ago but does not see a cardiologist regularly.  She has no history of blood clots in the legs of the lungs.  She has had no recent illnesses, fever, nasal congestion, dysuria, abdominal pain, nausea, vomiting.     Physical Exam   Triage Vital Signs: ED Triage Vitals  Enc Vitals Group     BP 12/02/21 2335 (!) 182/63     Pulse Rate 12/02/21 2335 72     Resp 12/02/21 2335 18     Temp 12/02/21 2335 98.3 F (36.8 C)     Temp Source 12/02/21 2335 Oral     SpO2 12/02/21 2335 98 %     Weight 12/02/21 2333 73 kg (161 lb)     Height 12/02/21 2333 1.549 m (5\' 1" )     Head Circumference --      Peak Flow --      Pain Score 12/02/21 2332 6     Pain Loc --      Pain Edu? --      Excl. in [place]? --     Most recent vital signs: Vitals:   12/03/21 0455 12/03/21 0530  BP: 110/78   Pulse: (!) 59 (!) 52  Resp: 16 12  Temp: 97.7 F (36.5 C)   SpO2: 98% 100%     General: Awake, no distress.  Patient denying pain at this time. CV:  Good peripheral perfusion.  Normal heart sounds. Resp:  Normal effort.  Lungs are clear to auscultation Abd:  No distention.  No tenderness to palpation. Other:  No focal neurological deficits.   ED Results / Procedures / Treatments   Labs (all labs ordered are listed, but only abnormal results are displayed) Labs Reviewed  BASIC METABOLIC PANEL - Abnormal; Notable for  the following components:      Result Value   Chloride 113 (*)    Glucose, Bld 102 (*)    BUN 24 (*)    Creatinine, Ser 1.66 (*)    Calcium 8.8 (*)    GFR, Estimated 38 (*)    Anion gap 4 (*)    All other components within normal limits  CBC - Abnormal; Notable for the following components:   RBC 3.49 (*)    Hemoglobin 11.6 (*)    HCT 35.3 (*)    MCV 101.1 (*)    RDW 16.6 (*)    All other components within normal limits  TROPONIN I (HIGH SENSITIVITY) - Abnormal; Notable for the following components:   Troponin I (High Sensitivity) 119 (*)    All other components within normal limits  TROPONIN I (HIGH SENSITIVITY) - Abnormal; Notable for the following components:   Troponin I (High Sensitivity) 125 (*)    All other components within normal limits  SARS CORONAVIRUS 2  BY RT PCR  MAGNESIUM     EKG  ED ECG REPORT I, Hinda Kehr, the attending physician, personally viewed and interpreted this ECG.  Date: 12/02/2021 EKG Time: 23: 43 Rate: 63 Rhythm: normal sinus rhythm QRS Axis: normal Intervals: LVH with repolarization abnormality, otherwise unremarkable ST/T Wave abnormalities: Non-specific ST segment / T-wave changes, but no clear evidence of acute ischemia. Narrative Interpretation: no definitive evidence of acute ischemia; does not meet STEMI criteria.    RADIOLOGY I viewed and interpreted the patient's two-view chest x-ray.  There is no evidence of pneumonia, pneumothorax, nor pulmonary edema.  I also read the radiologist's report, which confirmed no acute findings.    PROCEDURES:  Critical Care performed: Yes, see critical care procedure note(s)  .1-3 Lead EKG Interpretation  Performed by: Hinda Kehr, MD Authorized by: Hinda Kehr, MD     Interpretation: normal     ECG rate:  60   ECG rate assessment: normal     Rhythm: sinus rhythm     Ectopy: none     Conduction: normal      MEDICATIONS ORDERED IN ED: Medications  acetaminophen (TYLENOL)  tablet 500 mg (has no administration in time range)  allopurinol (ZYLOPRIM) tablet 100 mg (has no administration in time range)  aspirin EC tablet 81 mg (has no administration in time range)  atorvastatin (LIPITOR) tablet 80 mg (has no administration in time range)  cloNIDine (CATAPRES) tablet 0.3 mg (has no administration in time range)  furosemide (LASIX) tablet 20 mg (has no administration in time range)  losartan (COZAAR) tablet 50 mg (has no administration in time range)  pantoprazole (PROTONIX) EC tablet 40 mg (has no administration in time range)  clopidogrel (PLAVIX) tablet 75 mg (has no administration in time range)  potassium chloride SA (KLOR-CON M) CR tablet 10 mEq (has no administration in time range)  loratadine (CLARITIN) tablet 10 mg (has no administration in time range)  fluticasone (FLONASE) 50 MCG/ACT nasal spray 2 spray (has no administration in time range)  mometasone-formoterol (DULERA) 200-5 MCG/ACT inhaler 2 puff (has no administration in time range)  promethazine (PHENERGAN) tablet 12.5 mg (has no administration in time range)  lidocaine (XYLOCAINE) 5 % ointment 1 Application (has no administration in time range)  doxycycline (VIBRA-TABS) tablet 100 mg (100 mg Oral Given 12/03/21 0354)  amLODipine (NORVASC) tablet 2.5 mg (has no administration in time range)  albuterol (PROVENTIL) (2.5 MG/3ML) 0.083% nebulizer solution 2.5 mg (has no administration in time range)     IMPRESSION / MDM / ASSESSMENT AND PLAN / ED COURSE  I reviewed the triage vital signs and the nursing notes.                              Differential diagnosis includes, but is not limited to, angina, ACS including unstable angina, PE, pneumonia, electrolyte or metabolic abnormality.  Patient's presentation is most consistent with acute presentation with potential threat to life or bodily function.  Labs/studies ordered: High-sensitivity troponin x2, CBC, basic metabolic panel, two-view chest  x-ray, EKG, cardiac monitoring.  The patient is on the cardiac monitor to evaluate for evidence of arrhythmia and/or significant heart rate changes.  Patient's vital signs are stable and within normal limits.  No pain at this time.  Multiple comorbidities.  Chronic kidney disease with creatinine of 1.66, stable from prior.  CBC is essentially normal.  However initial high-sensitivity troponin is 119.  She has had elevated troponins  in the past around 60 but this is abnormally high for her with a stable creatinine.  Given the acute onset chest pain that woke her from sleep and the troponin elevated higher than usual, I feel it is reasonable and appropriate to admit the patient for chest pain observation and cardiology evaluation.  I will hold off on starting heparin as I think it is likely that the hospitalist will not continue the medication.  Patient received a full dose aspirin prior to arrival.  Patient agrees with the plan.   Clinical Course as of 12/03/21 9201  Thu Dec 03, 2021  0152 Consult with Dr. Leslye Peer with the hospitalist service.  We discussed the case and he will admit for chest pain observation. [CF]  0225 Troponin I (High Sensitivity)(!!): 125 Troponin going up slightly but relatively minimally compared to initial.  Holding off on heparin - will defer to hospitalist. [CF]    Clinical Course User Index [CF] Hinda Kehr, MD     FINAL CLINICAL IMPRESSION(S) / ED DIAGNOSES   Final diagnoses:  Chest pain, unspecified type  Elevated troponin level  Chronic kidney disease, unspecified CKD stage     Rx / DC Orders   ED Discharge Orders     None        Note:  This document was prepared using Dragon voice recognition software and may include unintentional dictation errors.   Hinda Kehr, MD 12/03/21 [phone]

## 2021-12-03 NOTE — ED Notes (Signed)
Pt resting. Call bell at left side.

## 2021-12-03 NOTE — ED Notes (Signed)
Pt to cardiac stress test with staff.

## 2021-12-03 NOTE — ED Notes (Signed)
Pt resting calm and comfortably at this time. Normal rise and fall of chest. NAD noted. Call bell in reach.

## 2021-12-03 NOTE — ED Notes (Signed)
Reponse received from [employer], np, no new orders received.

## 2021-12-03 NOTE — H&P (Signed)
History and Physical    Patient: Gabrielle Schultz ZMO:[id number] DOB: 07-Feb-1970 DOA: 12/03/2021 DOS: the patient was seen and examined on 12/03/2021 PCP: Langston Reusing, NP  Patient coming from: Home  Chief Complaint:  Chief Complaint  Patient presents with   Chest Pain   HPI: Gabrielle Schultz is a 51 y.o. female with medical history significant of peripheral vascular disease, ischemic colitis, hypertension, chronic kidney disease, 5 strokes, coronary artery disease.  She presents to the hospital with labile blood pressure.  She states that her primary care physician took her off the Norvasc and a fluid pill and placed her on lovastatin.  Her blood pressure has been high recently.  She also had chest pain felt like a toothache in the center of his chest and lasted total of 15 minutes until she took 4 aspirin.  Associated with some shortness of breath.  No nausea or vomiting.  First troponin 119 and second troponin 125.  She also feels like she has a sinus infection going on for the past month. Review of Systems:  Review of Systems  Constitutional:  Negative for chills and fever.  HENT:  Positive for sinus pain. Negative for hearing loss.   Eyes:  Negative for blurred vision.  Respiratory:  Positive for shortness of breath. Negative for cough.   Cardiovascular:  Positive for chest pain.  Gastrointestinal:  Negative for abdominal pain, constipation, diarrhea, nausea and vomiting.  Genitourinary:  Negative for dysuria.  Musculoskeletal:  Positive for joint pain and myalgias.  Skin:  Negative for rash.  Neurological:  Negative for dizziness.  Endo/Heme/Allergies:  Does not bruise/bleed easily.  Psychiatric/Behavioral:  Negative for depression.     Past Medical History:  Diagnosis Date   Acid reflux    Allergic rhinitis 06/26/2015   Allergy    Asthma    Carpal tunnel syndrome    right hand   Chronic kidney disease    Colitis    CVA (cerebral infarction)    2009, 2015   Gout     History of syncope    Hypertension 2008   Low HDL (under 40) 06/26/2015   MI (myocardial infarction) ([place])    2015 - patient wasnt told by cardiologist that she had MI but has had cardiac cath in past   Migraines    Poor circulation    Renal insufficiency    Stroke [facility])    Syncope and collapse    Past Surgical History:  Procedure Laterality Date   BUBBLE STUDY  12/03/2019   Procedure: BUBBLE STUDY;  Surgeon: Geralynn Rile, MD;  Location: [facility];  Service: Cardiovascular;;   CARDIAC CATHETERIZATION     CESAREAN SECTION  1994   Dr. Ammie Dalton   COLONOSCOPY WITH PROPOFOL N/A 06/26/2014   Procedure: COLONOSCOPY WITH PROPOFOL;  Surgeon: Josefine Class, MD;  Location: [facility] ENDOSCOPY;  Service: Endoscopy;  Laterality: N/A;   DILATION AND CURETTAGE OF UTERUS  x2 for incomplete SABs   TEE WITHOUT CARDIOVERSION N/A 12/03/2019   Procedure: TRANSESOPHAGEAL ECHOCARDIOGRAM (TEE);  Surgeon: Geralynn Rile, MD;  Location: [place];  Service: Cardiovascular;  Laterality: N/A;   Social History:  reports that she has been smoking cigarettes. She started smoking about 4 years ago. She has a 16.50 pack-year smoking history. She has never used smokeless tobacco. She reports current alcohol use. She reports that she does not use drugs.  Allergies  Allergen Reactions   Coconut (Cocos Nucifera) Hives and Swelling    Just  coconut ("all coconut")   Amoxicillin Other (See Comments)    "makes me sick" "swelled up everywhere" Has patient had a PCN reaction causing immediate rash, facial/tongue/throat swelling, SOB or lightheadedness with hypotension: No Has patient had a PCN reaction causing severe rash involving mucus membranes or skin necrosis: No Has patient had a PCN reaction that required hospitalization: No Has patient had a PCN reaction occurring within the last 10 years: No If all of the above answers are "NO", then may proceed with Cephalosporin use.     Penicillins  Other (See Comments)    "makes me sick" "swelled up oil" Has patient had a PCN reaction causing immediate rash, facial/tongue/throat swelling, SOB or lightheadedness with hypotension: No Has patient had a PCN reaction causing severe rash involving mucus membranes or skin necrosis: No Has patient had a PCN reaction that required hospitalization: No Has patient had a PCN reaction occurring within the last 10 years: Yes If all of the above answers are "NO", then may proceed with Cephalosporin use.    Singulair [Montelukast Sodium] Rash    "made heart race"    Family History  Problem Relation Age of Onset   Hyperlipidemia Mother    Heart disease Mother        has a defibrillator   COPD Mother    Hypertension Father    Heart disease Father    Hyperlipidemia Father    Hypertension Sister        died in her 53s from heart failure and end stage kidney disease   Kidney disease Sister    Hypertension Brother    Heart disease Brother        has a defibrillator   Coronary artery disease Brother        had CABG   Hyperlipidemia Brother    Healthy Child    Diabetes Child     Prior to Admission medications   Medication Sig Start Date End Date Taking? Authorizing Provider  acetaminophen (TYLENOL) 500 MG tablet Take 500 mg by mouth every 6 (six) hours as needed for fever or mild pain.    [provider]  albuterol (PROVENTIL HFA) 108 (90 Base) MCG/ACT inhaler Inhale 2 puffs into the lungs once every 4 (four) hours as needed for wheezing (cough). 11/03/21   Iloabachie, Chioma E, NP  allopurinol (ZYLOPRIM) 100 MG tablet TAKE ONE TABLET BY MOUTH ONCE DAILY. 05/21/21   Iloabachie, Chioma E, NP  aspirin EC 81 MG tablet Take 1 tablet (81 mg total) by mouth once daily. Swallow whole. 11/19/21   Iloabachie, Chioma E, NP  atorvastatin (LIPITOR) 80 MG tablet Take 1 tablet (80 mg total) by mouth daily. 10/22/21   Iloabachie, Chioma E, NP  Blood Pressure KIT 1 kit by Does not apply route daily at  2 PM. 10/22/21   Iloabachie, Chioma E, NP  cetirizine (ZYRTEC) 10 MG tablet TAKE ONE TABLET BY MOUTH ONCE DAILY. 11/19/21   Iloabachie, Chioma E, NP  cloNIDine (CATAPRES) 0.3 MG tablet Take 1 tablet by mouth 3 times daily (morning, noon, and evening). 05/21/21   Iloabachie, Chioma E, NP  clopidogrel (PLAVIX) 75 MG tablet Take 1 tablet (75 mg total) by mouth once daily. 09/24/21   Iloabachie, Chioma E, NP  fluticasone (FLONASE) 50 MCG/ACT nasal spray USE 1 SPRAY INTO BOTH NOSTRILS 2 TIMES A DAY 11/03/21   Iloabachie, Chioma E, NP  fluticasone-salmeterol (WIXELA INHUB) 250-50 MCG/ACT AEPB Inhale one puff into the lungs in the morning and at bedtime 09/30/21  Iloabachie, Chioma E, NP  furosemide (LASIX) 20 MG tablet Take 20 mg by mouth daily as needed.    [provider]  lidocaine (XYLOCAINE) 5 % ointment Apply 1 application to the affected area(s) topically once daily as needed. 05/21/21   Iloabachie, Chioma E, NP  losartan (COZAAR) 25 MG tablet Take 25 mg by mouth daily.    [provider]  losartan (COZAAR) 50 MG tablet Take 1 tablet (50 mg total) by mouth daily. 12/01/21   Iloabachie, Chioma E, NP  omeprazole (PRILOSEC) 20 MG capsule TAKE ONE CAPSULE BY MOUTH ONCE DAILY. 10/06/21   Iloabachie, Chioma E, NP  potassium chloride (KLOR-CON M) 10 MEQ tablet Take 1 tablet (10 mEq total) by mouth as needed (potassium). 10/22/21   Iloabachie, Chioma E, NP  promethazine (PHENERGAN) 25 MG tablet TAKE 1 TABLET BY MOUTH ONCE EVERY 6 HOURS AS NEEDED FOR NAUSEA OR VOMITING. 11/03/21   Iloabachie, Chioma E, NP  simvastatin (ZOCOR) 10 MG tablet TAKE ONE TABLET BY MOUTH EVERY DAY Patient not taking: Reported on 07/22/2020 05/22/20 7/32/20  Amalia Greenhouse    Physical Exam: Vitals:   12/02/21 2333 12/02/21 2335 12/03/21 0040 12/03/21 0100  BP:  (!) 182/63 (!) 170/53 (!) 172/57  Pulse:  72 62 (!) 58  Resp:  _0 Temp:  98.3 F (36.8 C)    TempSrc:  Oral    SpO2:  98% 97% 98%   Weight: 73 kg     Height: _1  (1.549 m)      Physical Exam HENT:     Head: Normocephalic.     Nose:     Comments: Some sinus pressure to palpation    Mouth/Throat:     Pharynx: No oropharyngeal exudate.  Eyes:     General: Lids are normal.     Conjunctiva/sclera: Conjunctivae normal.  Cardiovascular:     Rate and Rhythm: Normal rate and regular rhythm.     Heart sounds: Normal heart sounds, S1 normal and S2 normal.  Pulmonary:     Breath sounds: No decreased breath sounds, wheezing, rhonchi or rales.  Abdominal:     Palpations: Abdomen is soft.     Tenderness: There is no abdominal tenderness.  Musculoskeletal:     Right lower leg: Swelling present.     Left lower leg: Swelling present.  Skin:    General: Skin is warm.     Findings: No rash.  Neurological:     Mental Status: She is alert and oriented to person, place, and time.     Data Reviewed:  Creatinine 1.66 with a GFR of 38, troponin 119 and 125, hemoglobin 11.6, chest x-ray negative EKG shows normal sinus rhythm 63 bpm, LVH and T wave inversions inferiorly and laterally Assessment and Plan: * Chest pain Chest pain with borderline troponin.  We will keep n.p.o. and get cardiology consultation.  Continue aspirin Plavix and atorvastatin.  Hypertension We will give a dose of Norvasc.  Continue her clonidine and losartan.  CKD (chronic kidney disease) stage 3, GFR 30-59 ml/min (HCC) CKD stage IIIb.  CVA (cerebral vascular accident) ([place]) Continue aspirin and Plavix and Lipitor  Sinusitis Since symptoms going on for months I will give an antibiotic but also check a COVID test.  COPD (chronic obstructive pulmonary disease) ([place]) Continue inhalers  Hyperlipidemia LDL goal <70 Continue Lipitor      Advance Care Planning: Full code  Consults: Cardiology   Severity of Illness: The appropriate patient status  for this patient is OBSERVATION. Observation status is judged to be reasonable and necessary in  order to provide the required intensity of service to ensure the patient's safety. The patient's presenting symptoms, physical exam findings, and initial radiographic and laboratory data in the context of their medical condition is felt to place them at decreased risk for further clinical deterioration. Furthermore, it is anticipated that the patient will be medically stable for discharge from the hospital within 2 midnights of admission.   Author: Loletha Grayer, MD 12/03/2021 2:48 AM  For on call review [email or web address].

## 2021-12-03 NOTE — Assessment & Plan Note (Signed)
Since symptoms going on for months I will give an antibiotic but also check a COVID test.

## 2021-12-03 NOTE — ED Notes (Signed)
RN spoke to ultrasound tech who requested pt remain NPO until renal scan is complete.

## 2021-12-03 NOTE — Consult Note (Addendum)
Cardiology Consultation   Patient ID: [name] MRN: [id number]; DOB: 14-Sep-1970  Admit date: 12/03/2021 Date of Consult: 12/03/2021  PCP:  Langston Reusing, NP   [place] Providers Cardiologist:  Ida Rogue, MD   {  Patient Profile:   Gabrielle Schultz is a 51 y.o. female with a hx of hypertension, recurrent strokes, CKD, hypertension, PVD, tobacco use who is being seen 12/03/2021 for the evaluation of chest pain at the request of Dr. Jimmye Norman.  History of Present Illness:   Gabrielle Schultz is followed by Dr. [name] for the above cardiac issues.  She smokes 1 pakc every 2.5 days. Occasional alcohol use. No drug history.   Patient was seen in 2013 with nausea, right-sided facial tingling and numbness found to have severe hypertension.  Exercise stress test in 2017 was normal with no evidence of ischemia, mildly impaired exercise capacity.  Echo 2018 showed normal LVEF, severe AI, moderate MR, moderate TR.  Prior echocardiogram showed moderate aortic valve regurgitation.  Echo TEE in November 2021 for stroke showed type III diastolic dysfunction, moderate MR, no mobile elements, possible endocarditis versus healed vegetations, moderate aortic regurgitation, normal RV function, trivial MR, negative bubble study.  Patient has a history of multiple strokes. Patient was admitted for stroke in August 2023.  Echo 08/20/21 showed LVEF 60-65%, no WMA, mild LVH, G1DD, mildly dilated LA, moderate AI. Heart monitor at discharge showed NSR, 23 runs of NSVT longest 20.8 seconds, 10 runs of SVT, rare ectopy, no triggered events.  No A-fib noted.  Last seen 11/03/21 for history of strokes with recent ZIO monitor. Loop recorder was discussed, but plan to wait for insurance reasons.  The patient presented to the ER 12/03/2021 for chest pain. Pain started the night after feeling cold air outside. Pain was across the chest, 8/10. It lasted about 15 min. She took 2 baby aspirin which  improved the pain. She denies associated symptoms. EMS was called, who recommended she take 2 more baby aspirin. No recent fever, chills, LLE, orthopnea or pnd.   In the ER blood pressure 182/63, pulse 72, respiratory rate 18, afebrile, normal oxygen.  Labs showed potassium 4.3, creatinine 1.66, BUN 24, hemoglobin 11.6.  High-sensitivity troponin 119>125.  Chest x-ray negative. EKG showed NSR with LVH and repol abnormalities. The patient was admitted for further work-up.  Past Medical History:  Diagnosis Date   Acid reflux    Allergic rhinitis 06/26/2015   Allergy    Asthma    Carpal tunnel syndrome    right hand   Chronic kidney disease    Colitis    CVA (cerebral infarction)    2009, 2015   Gout    History of syncope    Hypertension 2008   Low HDL (under 40) 06/26/2015   MI (myocardial infarction) ([place])    2015 - patient wasnt told by cardiologist that she had MI but has had cardiac cath in past   Migraines    Poor circulation    Renal insufficiency    Stroke [facility])    Syncope and collapse     Past Surgical History:  Procedure Laterality Date   BUBBLE STUDY  12/03/2019   Procedure: BUBBLE STUDY;  Surgeon: Geralynn Rile, MD;  Location: [place];  Service: Cardiovascular;;   CARDIAC CATHETERIZATION     CESAREAN SECTION  1994   Dr. Ammie Dalton   COLONOSCOPY WITH PROPOFOL N/A 06/26/2014   Procedure: COLONOSCOPY WITH PROPOFOL;  Surgeon: Josefine Class,  MD;  Location: [facility] ENDOSCOPY;  Service: Endoscopy;  Laterality: N/A;   DILATION AND CURETTAGE OF UTERUS  x2 for incomplete SABs   TEE WITHOUT CARDIOVERSION N/A 12/03/2019   Procedure: TRANSESOPHAGEAL ECHOCARDIOGRAM (TEE);  Surgeon: Geralynn Rile, MD;  Location: [place];  Service: Cardiovascular;  Laterality: N/A;     Home Medications:  Prior to Admission medications   Medication Sig Start Date End Date Taking? Authorizing Provider  acetaminophen (TYLENOL) 500 MG tablet Take 500 mg by mouth every 6  (six) hours as needed for fever or mild pain.   Yes [provider]  albuterol (PROVENTIL HFA) 108 (90 Base) MCG/ACT inhaler Inhale 2 puffs into the lungs once every 4 (four) hours as needed for wheezing (cough). 11/03/21  Yes Iloabachie, Chioma E, NP  allopurinol (ZYLOPRIM) 100 MG tablet TAKE ONE TABLET BY MOUTH ONCE DAILY. 05/21/21  Yes Iloabachie, Chioma E, NP  aspirin EC 81 MG tablet Take 1 tablet (81 mg total) by mouth once daily. Swallow whole. 11/19/21  Yes Iloabachie, Chioma E, NP  atorvastatin (LIPITOR) 80 MG tablet Take 1 tablet (80 mg total) by mouth daily. 10/22/21  Yes Iloabachie, Chioma E, NP  cetirizine (ZYRTEC) 10 MG tablet TAKE ONE TABLET BY MOUTH ONCE DAILY. 11/19/21  Yes Iloabachie, Chioma E, NP  cloNIDine (CATAPRES) 0.3 MG tablet Take 1 tablet by mouth 3 times daily (morning, noon, and evening). 05/21/21  Yes Iloabachie, Chioma E, NP  clopidogrel (PLAVIX) 75 MG tablet Take 1 tablet (75 mg total) by mouth once daily. 09/24/21  Yes Iloabachie, Chioma E, NP  fluticasone (FLONASE) 50 MCG/ACT nasal spray USE 1 SPRAY INTO BOTH NOSTRILS 2 TIMES A DAY 11/03/21  Yes Iloabachie, Chioma E, NP  fluticasone-salmeterol (WIXELA INHUB) 250-50 MCG/ACT AEPB Inhale one puff into the lungs in the morning and at bedtime 09/30/21  Yes Iloabachie, Chioma E, NP  lidocaine (XYLOCAINE) 5 % ointment Apply 1 application to the affected area(s) topically once daily as needed. 05/21/21  Yes Iloabachie, Chioma E, NP  losartan (COZAAR) 50 MG tablet Take 1 tablet (50 mg total) by mouth daily. 12/01/21  Yes Iloabachie, Chioma E, NP  omeprazole (PRILOSEC) 20 MG capsule TAKE ONE CAPSULE BY MOUTH ONCE DAILY. 10/06/21  Yes Iloabachie, Chioma E, NP  promethazine (PHENERGAN) 25 MG tablet TAKE 1 TABLET BY MOUTH ONCE EVERY 6 HOURS AS NEEDED FOR NAUSEA OR VOMITING. 11/03/21  Yes Iloabachie, Chioma E, NP  Blood Pressure KIT 1 kit by Does not apply route daily at 2 PM. 10/22/21   Iloabachie, Chioma E, NP  furosemide  (LASIX) 20 MG tablet Take 20 mg by mouth daily as needed. Patient not taking: Reported on 12/03/2021    [provider]  potassium chloride (KLOR-CON M) 10 MEQ tablet Take 1 tablet (10 mEq total) by mouth as needed (potassium). Patient not taking: Reported on 12/03/2021 10/22/21   Iloabachie, Chioma E, NP  simvastatin (ZOCOR) 10 MG tablet TAKE ONE TABLET BY MOUTH EVERY DAY Patient not taking: Reported on 07/22/2020 05/22/20 0/98/11  Copland, Deirdre Evener, PA-C    Inpatient Medications: Scheduled Meds:  allopurinol  100 mg Oral Daily   amLODipine  2.5 mg Oral Daily   aspirin EC  81 mg Oral Daily   atorvastatin  80 mg Oral Daily   cloNIDine  0.3 mg Oral TID WC   clopidogrel  75 mg Oral Daily   doxycycline  100 mg Oral Q12H   fluticasone  2 spray Each Nare Daily   loratadine  10  mg Oral Daily   losartan  50 mg Oral Daily   mometasone-formoterol  2 puff Inhalation BID   pantoprazole  40 mg Oral Daily   Continuous Infusions:  PRN Meds: acetaminophen, albuterol, furosemide, lidocaine, potassium chloride, promethazine  Allergies:    Allergies  Allergen Reactions   Coconut (Cocos Nucifera) Hives and Swelling    Just coconut ("all coconut")   Amoxicillin Other (See Comments)    "makes me sick" "swelled up everywhere" Has patient had a PCN reaction causing immediate rash, facial/tongue/throat swelling, SOB or lightheadedness with hypotension: No Has patient had a PCN reaction causing severe rash involving mucus membranes or skin necrosis: No Has patient had a PCN reaction that required hospitalization: No Has patient had a PCN reaction occurring within the last 10 years: No If all of the above answers are "NO", then may proceed with Cephalosporin use.     Penicillins Other (See Comments)    "makes me sick" "swelled up oil" Has patient had a PCN reaction causing immediate rash, facial/tongue/throat swelling, SOB or lightheadedness with hypotension: No Has patient had a PCN  reaction causing severe rash involving mucus membranes or skin necrosis: No Has patient had a PCN reaction that required hospitalization: No Has patient had a PCN reaction occurring within the last 10 years: Yes If all of the above answers are "NO", then may proceed with Cephalosporin use.    Singulair [Montelukast Sodium] Rash    "made heart race"    Social History:   Social History   Socioeconomic History   Marital status: Married    Spouse name: Not on file   Number of children: 2   Years of education: 11th grade   Highest education level: 11th grade  Occupational History   Occupation: unemployed  Tobacco Use   Smoking status: Every Day    Packs/day: 0.50    Years: 33.00    Total pack years: 16.50    Types: Cigarettes    Start date: 03/04/2017   Smokeless tobacco: Never  Vaping Use   Vaping Use: Never used  Substance and Sexual Activity   Alcohol use: Yes    Comment: rare- drinks an occassional wine cooler   Drug use: No   Sexual activity: Yes    Partners: Male    Birth control/protection: None  Other Topics Concern   Not on file  Social History Narrative   Husband is a Administrator for [employer] and is the sole provider for the family including 2 children (71 yr old and 21 yr old)    Goes to food bank for food. Says "really worried" about water and light bill. Husband, baby, 59 year old, her, and mother all live together.    Patient would like appointment with [facility].       Pt is right handed   Social Determinants of Health   Financial Resource Strain: High Risk (08/02/2017)   Overall Financial Resource Strain (CARDIA)    Difficulty of Paying Living Expenses: Hard  Food Insecurity: Food Insecurity Present (05/21/2021)   Hunger Vital Sign    Worried About Running Out of Food in the Last Year: Sometimes true    Ran Out of Food in the Last Year: Sometimes true  Transportation Needs: No Transportation Needs (05/21/2021)   PRAPARE - Radiographer, therapeutic (Medical): No    Lack of Transportation (Non-Medical): No  Physical Activity: Insufficiently Active (07/25/2018)   Exercise Vital Sign    Days of Exercise  per Week: 2 days    Minutes of Exercise per Session: 60 min  Stress: Stress Concern Present (08/02/2017)   [place]    Feeling of Stress : Very much  Social Connections: Somewhat Isolated (08/02/2017)   Social Connection and Isolation Panel [NHANES]    Frequency of Communication with Friends and Family: More than three times a week    Frequency of Social Gatherings with Friends and Family: Never    Attends Religious Services: Never    Marine scientist or Organizations: No    Attends Archivist Meetings: Never    Marital Status: Married  Human resources officer Violence: Not At Risk (08/02/2017)   Humiliation, Afraid, Rape, and Kick questionnaire    Fear of Current or Ex-Partner: No    Emotionally Abused: No    Physically Abused: No    Sexually Abused: No    Family History:    Family History  Problem Relation Age of Onset   Hyperlipidemia Mother    Heart disease Mother        has a defibrillator   COPD Mother    Hypertension Father    Heart disease Father    Hyperlipidemia Father    Hypertension Sister        died in her 31s from heart failure and end stage kidney disease   Kidney disease Sister    Hypertension Brother    Heart disease Brother        has a defibrillator   Coronary artery disease Brother        had CABG   Hyperlipidemia Brother    Healthy Child    Diabetes Child      ROS:  Please see the history of present illness.   All other ROS reviewed and negative.     Physical Exam/Data:   Vitals:   12/03/21 0900 12/03/21 0918 12/03/21 0930 12/03/21 1000  BP: (!) 154/56  (!) 151/46 (!) 204/53  Pulse: (!) 55 (!) 49 (!) 46 62  Resp: _0 Temp:  98.3 F (36.8 C)    TempSrc:  Oral    SpO2: 99% 100% 98% 100%   Weight:      Height:       No intake or output data in the 24 hours ending 12/03/21 1018    12/02/2021   11:33 PM 11/19/2021    6:04 PM 11/03/2021    2:18 PM  Last 3 Weights  Weight (lbs) 161 lb 162 lb 3.2 oz 159 lb  Weight (kg) 73.029 kg 73.573 kg 72.122 kg     Body mass index is 30.42 kg/m.  General:  Well nourished, well developed, in no acute distress HEENT: normal Neck: no JVD Vascular: No carotid bruits; Distal pulses 2+ bilaterally Cardiac:  normal S1, S2; RRR; no murmur  Lungs:  clear to auscultation bilaterally, no wheezing, rhonchi or rales  Abd: soft, nontender, no hepatomegaly  Ext: no edema Musculoskeletal:  No deformities, BUE and BLE strength normal and equal Skin: warm and dry  Neuro:  CNs 2-12 intact, no focal abnormalities noted Psych:  Normal affect   EKG:  The EKG was personally reviewed and demonstrates:  NSR 63bpm, LVH with repol abnormalities Telemetry:  Telemetry was personally reviewed and demonstrates:  SB HR 40-50s, PVCs, NSVT  Relevant CV Studies:  Echo 08/2021  1. Agitated saline contrast bubble study was negative, with no evidence  of any interatrial shunt.  2. Left ventricular ejection fraction, by estimation, is 60 to 65%. The  left ventricle has normal function. The left ventricle has no regional  wall motion abnormalities. There is mild left ventricular hypertrophy.  Left ventricular diastolic parameters  are consistent with Grade I diastolic dysfunction (impaired relaxation).   3. Right ventricular systolic function is normal. The right ventricular  size is normal. Tricuspid regurgitation signal is inadequate for assessing  PA pressure.   4. Left atrial size was mildly dilated.   5. The mitral valve is normal in structure. No evidence of mitral valve  regurgitation. No evidence of mitral stenosis.   6. The aortic valve has an indeterminant number of cusps. Aortic valve  regurgitation is moderate. Aortic valve sclerosis is present,  with no  evidence of aortic valve stenosis.   7. The inferior vena cava is normal in size with greater than 50%  respiratory variability, suggesting right atrial pressure of 3 mmHg.   Heart monitor 08/2021 Event monitor Patch Wear Time:  12 days and 1 hours (2023-08-18T13:48:44-0400 to 2023-08-30T15:04:22-0400)   Normal sinus rhythm Patient had a min HR of 41 bpm, max HR of 197 bpm, and avg HR of 68 bpm.   23 Ventricular Tachycardia runs occurred, the run with the fastest interval lasting 7 beats with a max rate of 197 bpm, the longest lasting 20.8 secs with an avg rate of 117 bpm.    10 Supraventricular Tachycardia/atrial tachycardia runs occurred, the run with the fastest interval lasting 6 beats with a max rate of 185 bpm, the longest lasting 19 beats with an avg rate of 102 bpm.    Isolated SVEs were rare (<1.0%), SVE Couplets were rare (<1.0%), and SVE Triplets were rare (<1.0%).  Isolated VEs were rare (<1.0%, 921), VE Couplets were rare (<1.0%, 62), and VE Triplets were rare (<1.0%, 8). Ventricular Bigeminy and Trigeminy were present.   No patient triggered events recorded   Signed, Esmond Plants, MD, Ph.D [facility]    Echo 07/2020 1. Left ventricular ejection fraction, by estimation, is 65 to 70%. The  left ventricle has normal function. The left ventricle has no regional  wall motion abnormalities. Left ventricular diastolic parameters were  normal.   2. Right ventricular systolic function is normal. The right ventricular  size is normal.   3. The mitral valve is normal in structure. Mild mitral valve  regurgitation.   4. The aortic valve is normal in structure. Aortic valve regurgitation is  mild.   5. Agitated saline contrast bubble study was negative, with no evidence  of any interatrial shunt.     Laboratory Data:  High Sensitivity Troponin:   Recent Labs  Lab 12/02/21 2336 12/03/21 0142  TROPONINIHS 119* 125*     Chemistry Recent Labs  Lab  12/02/21 2336 12/03/21 0142  NA 140  --   K 4.3  --   CL 113*  --   CO2 23  --   GLUCOSE 102*  --   BUN 24*  --   CREATININE 1.66*  --   CALCIUM 8.8*  --   MG  --  1.9  GFRNONAA 38*  --   ANIONGAP 4*  --     No results for input(s): "PROT", "ALBUMIN", "AST", "ALT", "ALKPHOS", "BILITOT" in the last 168 hours. Lipids No results for input(s): "CHOL", "TRIG", "HDL", "LABVLDL", "LDLCALC", "CHOLHDL" in the last 168 hours.  Hematology Recent Labs  Lab 12/02/21 2336  WBC 8.8  RBC 3.49*  HGB 11.6*  HCT 35.3*  MCV 101.1*  MCH 33.2  MCHC 32.9  RDW 16.6*  PLT 224   Thyroid No results for input(s): "TSH", "FREET4" in the last 168 hours.  BNPNo results for input(s): "BNP", "PROBNP" in the last 168 hours.  DDimer No results for input(s): "DDIMER" in the last 168 hours.   Radiology/Studies:  DG Chest 2 View  Result Date: 12/03/2021 CLINICAL DATA:  Chest pain on the right side for 1 hour, initial encounter EXAM: CHEST - 2 VIEW COMPARISON:  06/22/2017 FINDINGS: Cardiac shadow is enlarged. Scarring is noted bilaterally. No focal infiltrate or effusion is seen. No bony abnormality is noted. IMPRESSION: No acute abnormality noted. Electronically Signed   By: Inez Catalina M.D.   On: 12/03/2021 00:12     Assessment and Plan:   Chest pain - presented with chest pain with typical and atypical features. BP found to be severely elevated - HS troponin 119>125 - EKG with no significant changes - not started on IV heparin - Echo from August 2023 showed LVEF 60-65%, no WMA, mild LVH, G1DD. - continue PTA Aspirin, Lipitor, Plavix,  - given CKD and flat troponins, we will order Myoview lexicsan  HTN - BP elevated on arrival - PTA Losartan 41m daily and clonidine 0.338mID - started on amlodipine 2.39m57maily>will increase - avoid BB with baseline bradycardia - I will order a renal US Kore[place]ill need further med adjustment with high pressures  H/o strokes - heart monitor with no afib - plan  for loop recorder pending insurance - continue Aspirin, Plavix and statin therapy  Aortic valve regurgitation - Echo 08/2021 showed LVEF 60-65% and moderate AI  For questions or updates, please contact [facility] consult [email or web address] for contact info under    Signed, Vernie Piet H FNinfa MeekerA-C  12/03/2021 10:18 AM

## 2021-12-03 NOTE — ED Notes (Signed)
Pt eating ice chips

## 2021-12-03 NOTE — Assessment & Plan Note (Signed)
Chest pain with borderline troponin.  We will keep n.p.o. and get cardiology consultation.  Continue aspirin Plavix and atorvastatin.

## 2021-12-03 NOTE — ED Notes (Signed)
Pt up to restroom.

## 2021-12-03 NOTE — ED Notes (Signed)
Pt concerned of not being able to eat or drink due to being told she "could not until cardiology seen her". Admitting MD made aware.  Pt assisted to bedside toilet, pt stand-by assist at this time with steady gait.

## 2021-12-03 NOTE — Assessment & Plan Note (Addendum)
CKD stage IIIb.

## 2021-12-03 NOTE — Assessment & Plan Note (Signed)
We will give a dose of Norvasc.  Continue her clonidine and losartan.

## 2021-12-03 NOTE — Discharge Summary (Signed)
Physician Discharge Summary  Gabrielle Schultz WIO:[id number] DOB: September 20, 1970 DOA: 12/03/2021  PCP: Langston Reusing, NP  Admit date: 12/03/2021 Discharge date: 12/03/2021  Admitted From: home  Disposition:  home  Recommendations for Outpatient Follow-up:  Follow up with PCP in 1-2 weeks F/u w/ cardio, Dr. [name], in 1-2 weeks Cardio will set up outpatient renal US   Home Health: no  Equipment/Devices:  Discharge Condition: stable  CODE STATUS: full  Diet recommendation: Heart Healthy   Brief/Interim Summary: HPI was taken from Dr. Leslye Peer: Gabrielle Schultz is a 51 y.o. female with medical history significant of peripheral vascular disease, ischemic colitis, hypertension, chronic kidney disease, 5 strokes, coronary artery disease.  She presents to the hospital with labile blood pressure.  She states that her primary care physician took her off the Norvasc and a fluid pill and placed her on lovastatin.  Her blood pressure has been high recently.  She also had chest pain felt like a toothache in the center of his chest and lasted total of 15 minutes until she took 4 aspirin.  Associated with some shortness of breath.  No nausea or vomiting.  First troponin 119 and second troponin 125.  She also feels like she has a sinus infection going on for the past month.    As per Dr. Jimmye Norman 12/03/21: Pt presented w/ chest pain so cardio was consulted. Pt had a cardiac stress test which was neg. Of note, pt was found to have HTN on arrival so cardio ordered renal US but this will be done as an outpatient as pt wanted to be d/c home after her cardiac stress test was neg. Pt's chest pain had resolved prior to d/c as well. For more information, please see previous progress/consult notes.   Discharge Diagnoses:  Principal Problem:   Chest pain Active Problems:   Hypertension   Chronic kidney disease   CVA (cerebral vascular accident) ([facility])   Smoker   Hyperlipidemia LDL goal <70   COPD (chronic  obstructive pulmonary disease) (HCC)   Elevated troponin   Sinusitis  Chest pain: w/ minimally elevated troponins which is likely secondary to demand ischemia. S/p cardiac stress test which was neg. Chest pain resolved prior to d/c.   HTN: continue on home dose of amlodipine, clonidine, losartan. 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 Avoid nephrotoxic meds    Hx of CVAs: continue on aspirin, statin, plavix    Sinusitis: continue on doxycycline. COVID19 was neg    COPD: w/o exacerbation. Continue on bronchodilators    HLD: continue on statin   Obesity: BMI 30.4. Would benefit from weight loss   Discharge Instructions  Discharge Instructions     Diet - low sodium heart healthy   Complete by: As directed    Discharge instructions   Complete by: As directed    F/u w/ cardio, Dr. [name], in 1-2 weeks. Cardio will set up renal US as an outpatient. F/u w/ PCP in 1-2 weeks   Increase activity slowly   Complete by: As directed       Allergies as of 12/03/2021       Reactions   Coconut (cocos Nucifera) Hives, Swelling   Just coconut ("all coconut")   Amoxicillin Other (See Comments)   "makes me sick" "swelled up everywhere" Has patient had a PCN reaction causing immediate rash, facial/tongue/throat swelling, SOB or lightheadedness with hypotension: No Has patient had a PCN reaction causing  severe rash involving mucus membranes or skin necrosis: No Has patient had a PCN reaction that required hospitalization: No Has patient had a PCN reaction occurring within the last 10 years: No If all of the above answers are "NO", then may proceed with Cephalosporin use.   Penicillins Other (See Comments)   "makes me sick" "swelled up oil" Has patient had a PCN reaction causing immediate rash, facial/tongue/throat swelling, SOB or lightheadedness with hypotension: No Has patient had a PCN reaction causing severe rash involving mucus membranes  or skin necrosis: No Has patient had a PCN reaction that required hospitalization: No Has patient had a PCN reaction occurring within the last 10 years: Yes If all of the above answers are "NO", then may proceed with Cephalosporin use.   Singulair [montelukast Sodium] Rash   "made heart race"        Medication List     TAKE these medications    acetaminophen 500 MG tablet Commonly known as: TYLENOL Take 500 mg by mouth every 6 (six) hours as needed for fever or mild pain.   albuterol 108 (90 Base) MCG/ACT inhaler Commonly known as: Proventil HFA Inhale 2 puffs into the lungs once every 4 (four) hours as needed for wheezing (cough).   allopurinol 100 MG tablet Commonly known as: ZYLOPRIM TAKE ONE TABLET BY MOUTH ONCE DAILY.   amLODipine 10 MG tablet Commonly known as: NORVASC Take 1 tablet (10 mg total) by mouth daily.   aspirin EC 81 MG tablet Take 1 tablet (81 mg total) by mouth once daily. Swallow whole.   atorvastatin 80 MG tablet Commonly known as: LIPITOR Take 1 tablet (80 mg total) by mouth daily.   Blood Pressure Kit 1 kit by Does not apply route daily at 2 PM.   cetirizine 10 MG tablet Commonly known as: ZYRTEC TAKE ONE TABLET BY MOUTH ONCE DAILY.   cloNIDine 0.3 MG tablet Commonly known as: CATAPRES Take 1 tablet by mouth 3 times daily (morning, noon, and evening).   clopidogrel 75 MG tablet Commonly known as: PLAVIX Take 1 tablet (75 mg total) by mouth once daily.   doxycycline 100 MG tablet Commonly known as: VIBRA-TABS Take 1 tablet (100 mg total) by mouth every 12 (twelve) hours for 5 days.   fluticasone 50 MCG/ACT nasal spray Commonly known as: FLONASE USE 1 SPRAY INTO BOTH NOSTRILS 2 TIMES A DAY   furosemide 20 MG tablet Commonly known as: LASIX Take 1 tablet (20 mg total) by mouth daily as needed for fluid or edema. What changed: reasons to take this   lidocaine 5 % ointment Commonly known as: XYLOCAINE Apply 1 application to the  affected area(s) topically once daily as needed.   losartan 50 MG tablet Commonly known as: COZAAR Take 1 tablet (50 mg total) by mouth daily.   omeprazole 20 MG capsule Commonly known as: PRILOSEC TAKE ONE CAPSULE BY MOUTH ONCE DAILY.   potassium chloride 10 MEQ tablet Commonly known as: KLOR-CON M Take 1 tablet (10 mEq total) by mouth as needed (potassium). To take whenever you take lasix What changed: additional instructions   promethazine 25 MG tablet Commonly known as: PHENERGAN TAKE 1 TABLET BY MOUTH ONCE EVERY 6 HOURS AS NEEDED FOR NAUSEA OR VOMITING.   Wixela Inhub 250-50 MCG/ACT Aepb Generic drug: fluticasone-salmeterol Inhale one puff into the lungs in the morning and at bedtime        Follow-up Information     [name], MD Follow up.  Specialty: Cardiology Why: F/u in 1-2 weeks Contact information: [place] [address] [phone]         Langston Reusing, NP Follow up.   Specialty: Gerontology Why: F/u in 1-2 weeks Contact information: [place] [address] [phone]                Allergies  Allergen Reactions   Coconut (Cocos Nucifera) Hives and Swelling    Just coconut ("all coconut")   Amoxicillin Other (See Comments)    "makes me sick" "swelled up everywhere" Has patient had a PCN reaction causing immediate rash, facial/tongue/throat swelling, SOB or lightheadedness with hypotension: No Has patient had a PCN reaction causing severe rash involving mucus membranes or skin necrosis: No Has patient had a PCN reaction that required hospitalization: No Has patient had a PCN reaction occurring within the last 10 years: No If all of the above answers are "NO", then may proceed with Cephalosporin use.     Penicillins Other (See Comments)    "makes me sick" "swelled up oil" Has patient had a PCN reaction causing immediate rash, facial/tongue/throat swelling, SOB or  lightheadedness with hypotension: No Has patient had a PCN reaction causing severe rash involving mucus membranes or skin necrosis: No Has patient had a PCN reaction that required hospitalization: No Has patient had a PCN reaction occurring within the last 10 years: Yes If all of the above answers are "NO", then may proceed with Cephalosporin use.    Singulair [Montelukast Sodium] Rash    "made heart race"    Consultations: Cardio    Procedures/Studies: NM Myocar Multi W/Spect W/Wall Motion / EF  Result Date: 12/03/2021 Pharmacological myocardial perfusion imaging study with no significant  ischemia Normal wall motion, EF estimated at 50%, GI uptake artifact noted Fixed apical defect likely from attenuation artifact No EKG changes concerning for ischemia at peak stress or in recovery. CT attenuation correction images with trace aortic atherosclerosis, no significant coronary calcification Low risk scan Signed, Esmond Plants, MD, Ph.D [facility]   DG Chest 2 View  Result Date: 12/03/2021 CLINICAL DATA:  Chest pain on the right side for 1 hour, initial encounter EXAM: CHEST - 2 VIEW COMPARISON:  06/22/2017 FINDINGS: Cardiac shadow is enlarged. Scarring is noted bilaterally. No focal infiltrate or effusion is seen. No bony abnormality is noted. IMPRESSION: No acute abnormality noted. Electronically Signed   By: Inez Catalina M.D.   On: 12/03/2021 00:12   (Echo, Carotid, EGD, Colonoscopy, ERCP)    Subjective:   Discharge Exam: Vitals:   12/03/21 1330 12/03/21 1333  BP: (!) 179/54   Pulse: 62   Resp: 14   Temp:  98.4 F (36.9 C)  SpO2: 98%    Vitals:   12/03/21 1045 12/03/21 1324 12/03/21 1330 12/03/21 1333  BP:  (!) 166/62 (!) 179/54   Pulse: (!) 49  62   Resp: _0 Temp:    98.4 F (36.9 C)  TempSrc:    Oral  SpO2: 99%  98%   Weight:      Height:        General: Pt is alert, awake, not in acute distress Cardiovascular: S1/S2 +, no rubs, no  gallops Respiratory: CTA bilaterally, no wheezing, no rhonchi Abdominal: Soft, NT, obese, bowel sounds + Extremities: no edema, no cyanosis    The results of significant diagnostics from this hospitalization (including imaging, microbiology, ancillary and laboratory) are listed below  for reference.     Microbiology: Recent Results (from the past 240 hour(s))  SARS Coronavirus 2 by RT PCR (hospital order, performed in [facility] hospital lab) *cepheid single result test* Anterior Nasal Swab     Status: None   Collection Time: 12/03/21  3:02 AM   Specimen: Anterior Nasal Swab  Result Value Ref Range Status   SARS Coronavirus 2 by RT PCR NEGATIVE NEGATIVE Final    Comment: (NOTE) SARS-CoV-2 target nucleic acids are NOT DETECTED.  The SARS-CoV-2 RNA is generally detectable in upper and lower respiratory specimens during the acute phase of infection. The lowest concentration of SARS-CoV-2 viral copies this assay can detect is 250 copies / mL. A negative result does not preclude SARS-CoV-2 infection and should not be used as the sole basis for treatment or other patient management decisions.  A negative result may occur with improper specimen collection / handling, submission of specimen other than nasopharyngeal swab, presence of viral mutation(s) within the areas targeted by this assay, and inadequate number of viral copies (<250 copies / mL). A negative result must be combined with clinical observations, patient history, and epidemiological information.  Fact Sheet for Patients:   https://www.patel.info/  Fact Sheet for Healthcare Providers: https://hall.com/  This test is not yet approved or  cleared by the [place] FDA and has been authorized for detection and/or diagnosis of SARS-CoV-2 by FDA under an Emergency Use Authorization (EUA).  This EUA will remain in effect (meaning this test can be used) for the duration of  the COVID-19 declaration under Section 564(b)(1) of the Act, 21 U.S.C. section 360bbb-3(b)(1), unless the authorization is terminated or revoked sooner.  Performed at [facility], [address] [place], [place] [address]      Labs: BNP (last 3 results) No results for input(s): "BNP" in the last 8760 hours. Basic Metabolic Panel: Recent Labs  Lab 12/02/21 2336 12/03/21 0142  NA 140  --   K 4.3  --   CL 113*  --   CO2 23  --   GLUCOSE 102*  --   BUN 24*  --   CREATININE 1.66*  --   CALCIUM 8.8*  --   MG  --  1.9   Liver Function Tests: No results for input(s): "AST", "ALT", "ALKPHOS", "BILITOT", "PROT", "ALBUMIN" in the last 168 hours. No results for input(s): "LIPASE", "AMYLASE" in the last 168 hours. No results for input(s): "AMMONIA" in the last 168 hours. CBC: Recent Labs  Lab 12/02/21 2336  WBC 8.8  HGB 11.6*  HCT 35.3*  MCV 101.1*  PLT 224   Cardiac Enzymes: No results for input(s): "CKTOTAL", "CKMB", "CKMBINDEX", "TROPONINI" in the last 168 hours. BNP: Invalid input(s): "POCBNP" CBG: No results for input(s): "GLUCAP" in the last 168 hours. D-Dimer No results for input(s): "DDIMER" in the last 72 hours. Hgb A1c No results for input(s): "HGBA1C" in the last 72 hours. Lipid Profile No results for input(s): "CHOL", "HDL", "LDLCALC", "TRIG", "CHOLHDL", "LDLDIRECT" in the last 72 hours. Thyroid function studies No results for input(s): "TSH", "T4TOTAL", "T3FREE", "THYROIDAB" in the last 72 hours.  Invalid input(s): "FREET3" Anemia work up No results for input(s): "VITAMINB12", "FOLATE", "FERRITIN", "TIBC", "IRON", "RETICCTPCT" in the last 72 hours. Urinalysis    Component Value Date/Time   COLORURINE YELLOW (A) 07/21/2020 2327   APPEARANCEUR CLOUDY (A) 07/21/2020 2327   APPEARANCEUR Clear 10/06/2017 1938   LABSPEC 1.013 07/21/2020 2327   LABSPEC 1.006 05/18/2013 2113   PHURINE 5.0 07/21/2020  [facility] 07/21/2020 2327    GLUCOSEU Negative 05/18/2013 2113   HGBUR NEGATIVE 07/21/2020 2327   BILIRUBINUR NEGATIVE 07/21/2020 2327   BILIRUBINUR Negative 10/06/2017 1938   BILIRUBINUR Negative 05/18/2013 2113   KETONESUR NEGATIVE 07/21/2020 2327   PROTEINUR 100 (A) 07/21/2020 2327   NITRITE NEGATIVE 07/21/2020 2327   LEUKOCYTESUR LARGE (A) 07/21/2020 2327   LEUKOCYTESUR Negative 05/18/2013 2113   Sepsis Labs Recent Labs  Lab 12/02/21 2336  WBC 8.8   Microbiology Recent Results (from the past 240 hour(s))  SARS Coronavirus 2 by RT PCR (hospital order, performed in [place] hospital lab) *cepheid single result test* Anterior Nasal Swab     Status: None   Collection Time: 12/03/21  3:02 AM   Specimen: Anterior Nasal Swab  Result Value Ref Range Status   SARS Coronavirus 2 by RT PCR NEGATIVE NEGATIVE Final    Comment: (NOTE) SARS-CoV-2 target nucleic acids are NOT DETECTED.  The SARS-CoV-2 RNA is generally detectable in upper and lower respiratory specimens during the acute phase of infection. The lowest concentration of SARS-CoV-2 viral copies this assay can detect is 250 copies / mL. A negative result does not preclude SARS-CoV-2 infection and should not be used as the sole basis for treatment or other patient management decisions.  A negative result may occur with improper specimen collection / handling, submission of specimen other than nasopharyngeal swab, presence of viral mutation(s) within the areas targeted by this assay, and inadequate number of viral copies (<250 copies / mL). A negative result must be combined with clinical observations, patient history, and epidemiological information.  Fact Sheet for Patients:   https://www.patel.info/  Fact Sheet for Healthcare Providers: https://hall.com/  This test is not yet approved or  cleared by the [place] FDA and has been authorized for detection and/or diagnosis of SARS-CoV-2 by FDA under an  Emergency Use Authorization (EUA).  This EUA will remain in effect (meaning this test can be used) for the duration of the COVID-19 declaration under Section 564(b)(1) of the Act, 21 U.S.C. section 360bbb-3(b)(1), unless the authorization is terminated or revoked sooner.  Performed at [facility], [address], [place], [place] [address]      Time coordinating discharge: Over 30 minutes  SIGNED:   Wyvonnia Dusky, MD  [facility] 12/03/2021, 3:55 PM Pager   If 7PM-7AM, please contact night-coverage [email or web address]

## 2021-12-03 NOTE — ED Notes (Signed)
Pt updated on NPO status 

## 2021-12-03 NOTE — ED Notes (Signed)
Report to jacque, rn 

## 2021-12-03 NOTE — ED Notes (Signed)
Pt with 5 complex run of v tach. Pt denies symptoms, states is pain free at this time, watching tv and eating ice chips. Secure chat message sent to [employer], np to notify.

## 2021-12-03 NOTE — ED Notes (Signed)
Pt received discharge papers and verbalized understanding of discharge instructions. IV removed. Pt ambulates to exit with steady gait 

## 2021-12-03 NOTE — Assessment & Plan Note (Signed)
Continue inhalers

## 2021-12-03 NOTE — ED Notes (Signed)
Cardiology PA at bedside. 

## 2021-12-04 ENCOUNTER — Other Ambulatory Visit: Payer: Self-pay

## 2021-12-27 ENCOUNTER — Other Ambulatory Visit: Payer: Self-pay | Admitting: Gerontology

## 2021-12-27 ENCOUNTER — Other Ambulatory Visit (HOSPITAL_COMMUNITY): Payer: Self-pay

## 2021-12-27 DIAGNOSIS — J3089 Other allergic rhinitis: Secondary | ICD-10-CM

## 2021-12-27 DIAGNOSIS — R110 Nausea: Secondary | ICD-10-CM

## 2021-12-27 DIAGNOSIS — Z889 Allergy status to unspecified drugs, medicaments and biological substances status: Secondary | ICD-10-CM

## 2021-12-27 MED FILL — Losartan Potassium Tab 50 MG: ORAL | 30 days supply | Qty: 30 | Fill #1 | Status: CN

## 2021-12-29 ENCOUNTER — Other Ambulatory Visit (HOSPITAL_COMMUNITY): Payer: Self-pay

## 2021-12-29 ENCOUNTER — Other Ambulatory Visit (HOSPITAL_BASED_OUTPATIENT_CLINIC_OR_DEPARTMENT_OTHER): Payer: Self-pay

## 2021-12-29 ENCOUNTER — Other Ambulatory Visit: Payer: Self-pay

## 2021-12-29 MED ORDER — FLUTICASONE PROPIONATE 50 MCG/ACT NA SUSP
NASAL | 0 refills | Status: DC
  Filled 2021-12-29 – 2022-01-01 (×2): qty 16, 30d supply, fill #0

## 2021-12-29 MED ORDER — PROMETHAZINE HCL 25 MG PO TABS
ORAL_TABLET | ORAL | 0 refills | Status: DC
  Filled 2021-12-29 – 2022-01-01 (×2): qty 15, 4d supply, fill #0

## 2021-12-29 MED FILL — Losartan Potassium Tab 50 MG: ORAL | 30 days supply | Qty: 30 | Fill #1 | Status: AC

## 2022-01-01 ENCOUNTER — Other Ambulatory Visit (HOSPITAL_COMMUNITY): Payer: Self-pay

## 2022-01-01 ENCOUNTER — Other Ambulatory Visit: Payer: Self-pay

## 2022-01-05 ENCOUNTER — Other Ambulatory Visit: Payer: Self-pay

## 2022-01-08 ENCOUNTER — Other Ambulatory Visit: Payer: Self-pay

## 2022-01-09 ENCOUNTER — Emergency Department: Payer: Medicaid Other

## 2022-01-09 ENCOUNTER — Observation Stay
Admission: EM | Admit: 2022-01-09 | Discharge: 2022-01-10 | Disposition: A | Discharge status: Home / Self Care | Payer: Medicaid Other | Attending: Internal Medicine | Admitting: Internal Medicine

## 2022-01-09 ENCOUNTER — Other Ambulatory Visit (HOSPITAL_COMMUNITY): Payer: Self-pay

## 2022-01-09 DIAGNOSIS — J45909 Unspecified asthma, uncomplicated: Secondary | ICD-10-CM | POA: Insufficient documentation

## 2022-01-09 DIAGNOSIS — R600 Localized edema: Secondary | ICD-10-CM

## 2022-01-09 DIAGNOSIS — I129 Hypertensive chronic kidney disease with stage 1 through stage 4 chronic kidney disease, or unspecified chronic kidney disease: Secondary | ICD-10-CM | POA: Insufficient documentation

## 2022-01-09 DIAGNOSIS — N183 Chronic kidney disease, stage 3 unspecified: Secondary | ICD-10-CM | POA: Insufficient documentation

## 2022-01-09 DIAGNOSIS — R29810 Facial weakness: Secondary | ICD-10-CM | POA: Diagnosis not present

## 2022-01-09 DIAGNOSIS — I639 Cerebral infarction, unspecified: Secondary | ICD-10-CM | POA: Diagnosis not present

## 2022-01-09 DIAGNOSIS — E785 Hyperlipidemia, unspecified: Secondary | ICD-10-CM | POA: Diagnosis present

## 2022-01-09 DIAGNOSIS — R4781 Slurred speech: Secondary | ICD-10-CM | POA: Diagnosis not present

## 2022-01-09 DIAGNOSIS — J449 Chronic obstructive pulmonary disease, unspecified: Secondary | ICD-10-CM | POA: Diagnosis present

## 2022-01-09 DIAGNOSIS — I69392 Facial weakness following cerebral infarction: Secondary | ICD-10-CM | POA: Diagnosis not present

## 2022-01-09 DIAGNOSIS — Z7902 Long term (current) use of antithrombotics/antiplatelets: Secondary | ICD-10-CM | POA: Insufficient documentation

## 2022-01-09 DIAGNOSIS — G459 Transient cerebral ischemic attack, unspecified: Secondary | ICD-10-CM | POA: Diagnosis not present

## 2022-01-09 DIAGNOSIS — G9341 Metabolic encephalopathy: Secondary | ICD-10-CM | POA: Diagnosis not present

## 2022-01-09 DIAGNOSIS — J4541 Moderate persistent asthma with (acute) exacerbation: Secondary | ICD-10-CM

## 2022-01-09 DIAGNOSIS — Z79899 Other long term (current) drug therapy: Secondary | ICD-10-CM | POA: Insufficient documentation

## 2022-01-09 DIAGNOSIS — F17210 Nicotine dependence, cigarettes, uncomplicated: Secondary | ICD-10-CM | POA: Diagnosis not present

## 2022-01-09 DIAGNOSIS — R471 Dysarthria and anarthria: Secondary | ICD-10-CM

## 2022-01-09 DIAGNOSIS — I351 Nonrheumatic aortic (valve) insufficiency: Secondary | ICD-10-CM | POA: Diagnosis not present

## 2022-01-09 DIAGNOSIS — R2990 Unspecified symptoms and signs involving the nervous system: Secondary | ICD-10-CM | POA: Diagnosis not present

## 2022-01-09 DIAGNOSIS — N189 Chronic kidney disease, unspecified: Secondary | ICD-10-CM | POA: Diagnosis present

## 2022-01-09 DIAGNOSIS — J3089 Other allergic rhinitis: Secondary | ICD-10-CM

## 2022-01-09 DIAGNOSIS — E782 Mixed hyperlipidemia: Secondary | ICD-10-CM

## 2022-01-09 DIAGNOSIS — K219 Gastro-esophageal reflux disease without esophagitis: Secondary | ICD-10-CM

## 2022-01-09 DIAGNOSIS — M109 Gout, unspecified: Secondary | ICD-10-CM

## 2022-01-09 DIAGNOSIS — Z889 Allergy status to unspecified drugs, medicaments and biological substances status: Secondary | ICD-10-CM

## 2022-01-09 DIAGNOSIS — I10 Essential (primary) hypertension: Secondary | ICD-10-CM | POA: Diagnosis present

## 2022-01-09 LAB — COMPREHENSIVE METABOLIC PANEL
ALT: 9 U/L (ref 0–44)
AST: 14 U/L — ABNORMAL LOW (ref 15–41)
Albumin: 3.6 g/dL (ref 3.5–5.0)
Alkaline Phosphatase: 113 U/L (ref 38–126)
Anion gap: 8 (ref 5–15)
BUN: 18 mg/dL (ref 6–20)
CO2: 23 mmol/L (ref 22–32)
Calcium: 8.9 mg/dL (ref 8.9–10.3)
Chloride: 107 mmol/L (ref 98–111)
Creatinine, Ser: 1.69 mg/dL — ABNORMAL HIGH (ref 0.44–1.00)
GFR, Estimated: 37 mL/min — ABNORMAL LOW (ref >60)
Glucose, Bld: 97 mg/dL (ref 70–99)
Potassium: 3.7 mmol/L (ref 3.5–5.1)
Sodium: 138 mmol/L (ref 135–145)
Total Bilirubin: 0.5 mg/dL (ref 0.3–1.2)
Total Protein: 6.9 g/dL (ref 6.5–8.1)

## 2022-01-09 LAB — CBC
HCT: 35.7 % — ABNORMAL LOW (ref 36.0–46.0)
Hemoglobin: 11.7 g/dL — ABNORMAL LOW (ref 12.0–15.0)
MCH: 32.8 pg (ref 26.0–34.0)
MCHC: 32.8 g/dL (ref 30.0–36.0)
MCV: 100 fL (ref 80.0–100.0)
Platelets: 238 10*3/uL (ref 150–400)
RBC: 3.57 MIL/uL — ABNORMAL LOW (ref 3.87–5.11)
RDW: 15.8 % — ABNORMAL HIGH (ref 11.5–15.5)
WBC: 7.2 10*3/uL (ref 4.0–10.5)
nRBC: 0 % (ref 0.0–0.2)

## 2022-01-09 LAB — PROTIME-INR
INR: 1.1 (ref 0.8–1.2)
Prothrombin Time: 13.9 seconds (ref 11.4–15.2)

## 2022-01-09 LAB — DIFFERENTIAL
Abs Immature Granulocytes: 0.01 10*3/uL (ref 0.00–0.07)
Basophils Absolute: 0.1 10*3/uL (ref 0.0–0.1)
Basophils Relative: 1 %
Eosinophils Absolute: 0.4 10*3/uL (ref 0.0–0.5)
Eosinophils Relative: 6 %
Immature Granulocytes: 0 %
Lymphocytes Relative: 38 %
Lymphs Abs: 2.8 10*3/uL (ref 0.7–4.0)
Monocytes Absolute: 0.3 10*3/uL (ref 0.1–1.0)
Monocytes Relative: 5 %
Neutro Abs: 3.6 10*3/uL (ref 1.7–7.7)
Neutrophils Relative %: 50 %

## 2022-01-09 LAB — ETHANOL: Alcohol, Ethyl (B): <10 mg/dL (ref <10)

## 2022-01-09 LAB — APTT: aPTT: 30 seconds (ref 24–36)

## 2022-01-09 LAB — CBG MONITORING, ED: Glucose-Capillary: 81 mg/dL (ref 70–99)

## 2022-01-09 MED ORDER — PANTOPRAZOLE SODIUM 40 MG PO TBEC
40.0000 mg | DELAYED_RELEASE_TABLET | Freq: Every day | ORAL | Status: DC
  Administered 2022-01-10: 40 mg via ORAL
  Filled 2022-01-09: qty 1

## 2022-01-09 MED ORDER — FLUTICASONE PROPIONATE 50 MCG/ACT NA SUSP: 1.0000 | Freq: Two times a day (BID) | NASAL | Status: DC

## 2022-01-09 MED ORDER — SODIUM CHLORIDE 0.9% FLUSH
3.0000 mL | Freq: Once | INTRAVENOUS | Status: AC
  Administered 2022-01-09: 3 mL via INTRAVENOUS

## 2022-01-09 MED ORDER — ATORVASTATIN CALCIUM 20 MG PO TABS
80.0000 mg | ORAL_TABLET | Freq: Every day | ORAL | Status: DC
  Filled 2022-01-09: qty 4

## 2022-01-09 MED ORDER — ASPIRIN 81 MG PO TBEC
81.0000 mg | DELAYED_RELEASE_TABLET | Freq: Every day | ORAL | Status: DC
  Administered 2022-01-10: 81 mg via ORAL
  Filled 2022-01-09: qty 1

## 2022-01-09 MED ORDER — ALLOPURINOL 100 MG PO TABS
100.0000 mg | ORAL_TABLET | Freq: Every day | ORAL | Status: DC
  Administered 2022-01-10: 100 mg via ORAL
  Filled 2022-01-09: qty 1

## 2022-01-09 MED ORDER — ACETAMINOPHEN 500 MG PO TABS: 500.0000 mg | ORAL_TABLET | Freq: Four times a day (QID) | ORAL | Status: DC | PRN

## 2022-01-09 MED ORDER — ALBUTEROL SULFATE (2.5 MG/3ML) 0.083% IN NEBU: 2.5000 mg | INHALATION_SOLUTION | RESPIRATORY_TRACT | Status: DC | PRN

## 2022-01-09 MED ORDER — CLONIDINE HCL 0.1 MG PO TABS
0.3000 mg | ORAL_TABLET | Freq: Three times a day (TID) | ORAL | Status: DC
  Administered 2022-01-10: 0.3 mg via ORAL
  Filled 2022-01-09: qty 3

## 2022-01-09 NOTE — Progress Notes (Signed)
CODE STROKE- PHARMACY COMMUNICATION   Time CODE STROKE called/page received:17:42  Time response to CODE STROKE was made (in person): 17:45  Time Stroke Kit retrieved from [employer] (only if needed):17:46  Name of Provider/Nurse contacted:  Past Medical History:  Diagnosis Date   Acid reflux    Allergic rhinitis 06/26/2015   Allergy    Asthma    Carpal tunnel syndrome    right hand   Chronic kidney disease    Colitis    CVA (cerebral infarction)    2009, 2015   Gout    History of syncope    Hypertension 2008   Low HDL (under 40) 06/26/2015   MI (myocardial infarction) ([facility])    2015 - patient wasnt told by cardiologist that she had MI but has had cardiac cath in past   Migraines    Poor circulation    Renal insufficiency    Stroke [facility])    Syncope and collapse    Prior to Admission medications   Medication Sig Start Date End Date Taking? Authorizing Provider  acetaminophen (TYLENOL) 500 MG tablet Take 500 mg by mouth every 6 (six) hours as needed for fever or mild pain.    [provider]  albuterol (PROVENTIL HFA) 108 (90 Base) MCG/ACT inhaler Inhale 2 puffs into the lungs once every 4 (four) hours as needed for wheezing (cough). 11/03/21   Iloabachie, Chioma E, NP  allopurinol (ZYLOPRIM) 100 MG tablet TAKE ONE TABLET BY MOUTH ONCE DAILY. 05/21/21   Iloabachie, Chioma E, NP  amLODipine (NORVASC) 10 MG tablet Take 1 tablet (10 mg total) by mouth daily. 12/03/21 01/03/22  Wyvonnia Dusky, MD  aspirin EC 81 MG tablet Take 1 tablet (81 mg total) by mouth once daily. Swallow whole. 11/19/21   Iloabachie, Chioma E, NP  atorvastatin (LIPITOR) 80 MG tablet Take 1 tablet (80 mg total) by mouth daily. 10/22/21   Iloabachie, Chioma E, NP  Blood Pressure Monitor MISC Use as needed to check blood pressure 10/22/21   Iloabachie, Chioma E, NP  cetirizine (ZYRTEC) 10 MG tablet TAKE ONE TABLET BY MOUTH ONCE DAILY. 11/19/21   Iloabachie, Chioma E, NP  cloNIDine (CATAPRES) 0.3 MG  tablet Take 1 tablet by mouth 3 times daily (morning, noon, and evening). 05/21/21   Iloabachie, Chioma E, NP  clopidogrel (PLAVIX) 75 MG tablet Take 1 tablet (75 mg total) by mouth once daily. 09/24/21   Iloabachie, Chioma E, NP  fluticasone (FLONASE) 50 MCG/ACT nasal spray USE 1 SPRAY INTO BOTH NOSTRILS 2 TIMES A DAY 12/29/21   Iloabachie, Chioma E, NP  fluticasone-salmeterol (WIXELA INHUB) 250-50 MCG/ACT AEPB Inhale one puff into the lungs in the morning and at bedtime 09/30/21   Iloabachie, Chioma E, NP  furosemide (LASIX) 20 MG tablet Take 1 tablet (20 mg total) by mouth daily as needed for fluid or edema. 12/03/21 01/03/22  Wyvonnia Dusky, MD  lidocaine (XYLOCAINE) 5 % ointment Apply 1 application to the affected area(s) topically once daily as needed. 05/21/21   Iloabachie, Chioma E, NP  losartan (COZAAR) 50 MG tablet Take 1 tablet (50 mg total) by mouth daily. 12/01/21   Iloabachie, Chioma E, NP  omeprazole (PRILOSEC) 20 MG capsule TAKE ONE CAPSULE BY MOUTH ONCE DAILY. 10/06/21   Iloabachie, Chioma E, NP  potassium chloride (KLOR-CON M) 10 MEQ tablet Take 1 tablet (10 mEq total) by mouth as needed (potassium). To take whenever you take lasix 12/03/21 01/02/22  Wyvonnia Dusky, MD  promethazine [facility]) 25  MG tablet TAKE 1 TABLET BY MOUTH ONCE EVERY 6 HOURS AS NEEDED FOR NAUSEA OR VOMITING. 12/29/21   Iloabachie, Chioma E, NP  simvastatin (ZOCOR) 10 MG tablet TAKE ONE TABLET BY MOUTH EVERY DAY Patient not taking: Reported on 07/22/2020 05/22/20 7/40/81  Copland, Deirdre Evener, PA-C    Vira Blanco ,PharmD Clinical Pharmacist  01/09/2022  7:12 PM

## 2022-01-09 NOTE — H&P (Signed)
History and Physical    Patient: Gabrielle Schultz JYN:[id number] DOB: 1970-06-23 DOA: 01/09/2022 DOS: the patient was seen and examined on 01/09/2022 PCP: Ranae Plumber, [place]  Patient coming from: Home  Chief Complaint:  Chief Complaint  Patient presents with   Code Stroke   HPI: Gabrielle Schultz is a 52 y.o. female with medical history significant of essential hypertension, hyperlipidemia, migraines, chronic kidney disease stage III: GERD, MI, gout, who presented to the ER with a complaint of code stroke.  Patient came in with dysarthria among other things.  Patient complained of right facial droop and slurred speech.  NIHSS scale of 3.  Although patient came within the window for CVA, teleneurology recommended no tPA.  Recommended monitoring.  Symptoms have not resolved.  Patient however has multiple CVA's in the past and as a result will be admitted for workup of CVA.  Review of Systems: As mentioned in the history of present illness. All other systems reviewed and are negative. Past Medical History:  Diagnosis Date   Acid reflux    Allergic rhinitis 06/26/2015   Allergy    Asthma    Carpal tunnel syndrome    right hand   Chronic kidney disease    Colitis    CVA (cerebral infarction)    2009, 2015   Gout    History of syncope    Hypertension 2008   Low HDL (under 40) 06/26/2015   MI (myocardial infarction) ([place])    2015 - patient wasnt told by cardiologist that she had MI but has had cardiac cath in past   Migraines    Poor circulation    Renal insufficiency    Stroke [facility])    Syncope and collapse    Past Surgical History:  Procedure Laterality Date   BUBBLE STUDY  12/03/2019   Procedure: BUBBLE STUDY;  Surgeon: Geralynn Rile, MD;  Location: [place];  Service: Cardiovascular;;   CARDIAC CATHETERIZATION     CESAREAN SECTION  1994   Dr. Ammie Dalton   COLONOSCOPY WITH PROPOFOL N/A 06/26/2014   Procedure: COLONOSCOPY WITH PROPOFOL;  Surgeon: Josefine Class, MD;   Location: [facility] ENDOSCOPY;  Service: Endoscopy;  Laterality: N/A;   DILATION AND CURETTAGE OF UTERUS  x2 for incomplete SABs   TEE WITHOUT CARDIOVERSION N/A 12/03/2019   Procedure: TRANSESOPHAGEAL ECHOCARDIOGRAM (TEE);  Surgeon: Geralynn Rile, MD;  Location: [place];  Service: Cardiovascular;  Laterality: N/A;   Social History:  reports that she has been smoking cigarettes. She started smoking about 4 years ago. She has a 16.50 pack-year smoking history. She has never used smokeless tobacco. She reports current alcohol use. She reports that she does not use drugs.  Allergies  Allergen Reactions   Coconut (Cocos Nucifera) Hives and Swelling    Just coconut ("all coconut")   Amoxicillin Other (See Comments)    "makes me sick" "swelled up everywhere" Has patient had a PCN reaction causing immediate rash, facial/tongue/throat swelling, SOB or lightheadedness with hypotension: No Has patient had a PCN reaction causing severe rash involving mucus membranes or skin necrosis: No Has patient had a PCN reaction that required hospitalization: No Has patient had a PCN reaction occurring within the last 10 years: No If all of the above answers are "NO", then may proceed with Cephalosporin use.     Penicillins Other (See Comments)    "makes me sick" "swelled up oil" Has patient had a PCN reaction causing immediate rash, facial/tongue/throat swelling, SOB or lightheadedness with  hypotension: No Has patient had a PCN reaction causing severe rash involving mucus membranes or skin necrosis: No Has patient had a PCN reaction that required hospitalization: No Has patient had a PCN reaction occurring within the last 10 years: Yes If all of the above answers are "NO", then may proceed with Cephalosporin use.    Singulair [Montelukast Sodium] Rash    "made heart race"    Family History  Problem Relation Age of Onset   Hyperlipidemia Mother    Heart disease Mother        has a defibrillator    COPD Mother    Hypertension Father    Heart disease Father    Hyperlipidemia Father    Hypertension Sister        died in her 26s from heart failure and end stage kidney disease   Kidney disease Sister    Hypertension Brother    Heart disease Brother        has a defibrillator   Coronary artery disease Brother        had CABG   Hyperlipidemia Brother    Healthy Child    Diabetes Child     Prior to Admission medications   Medication Sig Start Date End Date Taking? Authorizing Provider  acetaminophen (TYLENOL) 500 MG tablet Take 500 mg by mouth every 6 (six) hours as needed for fever or mild pain.    [provider]  albuterol (PROVENTIL HFA) 108 (90 Base) MCG/ACT inhaler Inhale 2 puffs into the lungs once every 4 (four) hours as needed for wheezing (cough). 11/03/21   Iloabachie, Chioma E, NP  allopurinol (ZYLOPRIM) 100 MG tablet TAKE ONE TABLET BY MOUTH ONCE DAILY. 05/21/21   Iloabachie, Chioma E, NP  amLODipine (NORVASC) 10 MG tablet Take 1 tablet (10 mg total) by mouth daily. 12/03/21 01/03/22  Wyvonnia Dusky, MD  aspirin EC 81 MG tablet Take 1 tablet (81 mg total) by mouth once daily. Swallow whole. 11/19/21   Iloabachie, Chioma E, NP  atorvastatin (LIPITOR) 80 MG tablet Take 1 tablet (80 mg total) by mouth daily. 10/22/21   Iloabachie, Chioma E, NP  Blood Pressure Monitor MISC Use as needed to check blood pressure 10/22/21   Iloabachie, Chioma E, NP  cetirizine (ZYRTEC) 10 MG tablet TAKE ONE TABLET BY MOUTH ONCE DAILY. 11/19/21   Iloabachie, Chioma E, NP  cloNIDine (CATAPRES) 0.3 MG tablet Take 1 tablet by mouth 3 times daily (morning, noon, and evening). 05/21/21   Iloabachie, Chioma E, NP  clopidogrel (PLAVIX) 75 MG tablet Take 1 tablet (75 mg total) by mouth once daily. 09/24/21   Iloabachie, Chioma E, NP  fluticasone (FLONASE) 50 MCG/ACT nasal spray USE 1 SPRAY INTO BOTH NOSTRILS 2 TIMES A DAY 12/29/21   Iloabachie, Chioma E, NP  fluticasone-salmeterol (WIXELA  INHUB) 250-50 MCG/ACT AEPB Inhale one puff into the lungs in the morning and at bedtime 09/30/21   Iloabachie, Chioma E, NP  furosemide (LASIX) 20 MG tablet Take 1 tablet (20 mg total) by mouth daily as needed for fluid or edema. 12/03/21 01/03/22  Wyvonnia Dusky, MD  lidocaine (XYLOCAINE) 5 % ointment Apply 1 application to the affected area(s) topically once daily as needed. 05/21/21   Iloabachie, Chioma E, NP  losartan (COZAAR) 50 MG tablet Take 1 tablet (50 mg total) by mouth daily. 12/01/21   Iloabachie, Chioma E, NP  omeprazole (PRILOSEC) 20 MG capsule TAKE ONE CAPSULE BY MOUTH ONCE DAILY. 10/06/21   Iloabachie, Chioma E, NP  potassium chloride (KLOR-CON M) 10 MEQ tablet Take 1 tablet (10 mEq total) by mouth as needed (potassium). To take whenever you take lasix 12/03/21 01/02/22  Wyvonnia Dusky, MD  promethazine (PHENERGAN) 25 MG tablet TAKE 1 TABLET BY MOUTH ONCE EVERY 6 HOURS AS NEEDED FOR NAUSEA OR VOMITING. 12/29/21   Iloabachie, Chioma E, NP  simvastatin (ZOCOR) 10 MG tablet TAKE ONE TABLET BY MOUTH EVERY DAY Patient not taking: Reported on 07/22/2020 05/22/20 3/53/61  Chad Cordial, PA-C    Physical Exam: Vitals:   01/09/22 1930 01/09/22 2000 01/09/22 2033 01/09/22 2100  BP: (!) 179/64  (!) 187/42 (!) 182/78  Pulse: (!) 52 (!) 56 (!) 54 61  Resp: 15 12 11  (!) 23  Temp:      TempSrc:      SpO2: 100% 100% 100% 100%   Constitutional: NAD, calm, comfortable Eyes: PERRL, lids and conjunctivae normal ENMT: Mucous membranes are moist. Posterior pharynx clear of any exudate or lesions.Normal dentition.  Neck: normal, supple, no masses, no thyromegaly Respiratory: clear to auscultation bilaterally, no wheezing, no crackles. Normal respiratory effort. No accessory muscle use.  Cardiovascular: Regular rate and rhythm, no murmurs / rubs / gallops. No extremity edema. 2+ pedal pulses. No carotid bruits.  Abdomen: no tenderness, no masses palpated. No hepatosplenomegaly. Bowel  sounds positive.  Musculoskeletal: Good range of motion, no joint swelling or tenderness, Skin: no rashes, lesions, ulcers. No induration Neurologic: CN 2-12 grossly intact. Sensation intact, DTR normal. Strength 5/5 in all 4.  No obvious symptoms of slurred speech and right facial droop. Psychiatric: Normal judgment and insight. Alert and oriented x 3. Normal mood  Data Reviewed:  Head CT without contrast is negative, blood pressure 170/92, Creatinine 1.69 and hemoglobin 11.7, head CT without contrast is negative  Assessment and Plan:  #1 acute CVA: Symptoms have now resolved.  Patient with multiple CVA in the past.  Will admit the patient.  Check MRI of the brain, carotid Dopplers and echo.  Continue home regimen.  Blood pressure medications and statin.  #2 essential hypertension: Patient takes amlodipine clonidine and other blood pressure medicines.  Permissive hypertension will be done.  #3 chronic kidney disease stage III: Appears to be at baseline.  Continue to monitor  #4 gout: On allopurinol.  Continue  #5 COPD: No acute exacerbation.  Continue to monitor  #6 hyperlipidemia: Continue statin     Advance Care Planning:   Code Status: Full Code full code  Consults: Teleneurology  Family Communication: No family at bedside  Severity of Illness: The appropriate patient status for this patient is OBSERVATION. Observation status is judged to be reasonable and necessary in order to provide the required intensity of service to ensure the patient's safety. The patient's presenting symptoms, physical exam findings, and initial radiographic and laboratory data in the context of their medical condition is felt to place them at decreased risk for further clinical deterioration. Furthermore, it is anticipated that the patient will be medically stable for discharge from the hospital within 2 midnights of admission.   AuthorBarbette Merino, MD 01/09/2022 9:45 PM  For on call review  [email or web address].

## 2022-01-09 NOTE — Consult Note (Signed)
NEUROLOGY TELECONSULTATION NOTE   Date of service: January 09, 2022 Patient Name: Gabrielle Schultz MRN:  [id number] DOB:  Jan 22, 1970 Reason for consult:   Requesting Provider: Dr. Carrie Mew Consult Participants: myself, patient, bedside RN, telestroke RN Location of the provider: [facility] Location of the patient: [place], [place]  This consult was provided via telemedicine with 2-way video and audio communication. The patient/family was informed that care would be provided in this way and agreed to receive care in this manner.   _ _ _   _ __   _ __ _ _  __ __   _ __   __ _  History of Present Illness   This is a 52 yo woman with hx multiple prior strokes, prior MI, HTN who presents with R facial droop and slurred speech. LKW 1630. NIHSS = 3 including one point for chronic R visual field deficit. CT head showed no acute abnl, multiple remote infarcts on personal review. TNK not administered 2/2 sx being too mild to treat.   ROS   Per HPI; all other systems reviewed and are negative  Past History   The following was personally reviewed:  Past Medical History:  Diagnosis Date   Acid reflux    Allergic rhinitis 06/26/2015   Allergy    Asthma    Carpal tunnel syndrome    right hand   Chronic kidney disease    Colitis    CVA (cerebral infarction)    2009, 2015   Gout    History of syncope    Hypertension 2008   Low HDL (under 40) 06/26/2015   MI (myocardial infarction) ([place])    2015 - patient wasnt told by cardiologist that she had MI but has had cardiac cath in past   Migraines    Poor circulation    Renal insufficiency    Stroke [facility])    Syncope and collapse    Past Surgical History:  Procedure Laterality Date   BUBBLE STUDY  12/03/2019   Procedure: BUBBLE STUDY;  Surgeon: Geralynn Rile, MD;  Location: [place];  Service: Cardiovascular;;   CARDIAC CATHETERIZATION     CESAREAN SECTION  1994   Dr. Ammie Dalton   COLONOSCOPY WITH PROPOFOL N/A 06/26/2014    Procedure: COLONOSCOPY WITH PROPOFOL;  Surgeon: Josefine Class, MD;  Location: [facility] ENDOSCOPY;  Service: Endoscopy;  Laterality: N/A;   DILATION AND CURETTAGE OF UTERUS  x2 for incomplete SABs   TEE WITHOUT CARDIOVERSION N/A 12/03/2019   Procedure: TRANSESOPHAGEAL ECHOCARDIOGRAM (TEE);  Surgeon: Geralynn Rile, MD;  Location: [place];  Service: Cardiovascular;  Laterality: N/A;   Family History  Problem Relation Age of Onset   Hyperlipidemia Mother    Heart disease Mother        has a defibrillator   COPD Mother    Hypertension Father    Heart disease Father    Hyperlipidemia Father    Hypertension Sister        died in her 50s from heart failure and end stage kidney disease   Kidney disease Sister    Hypertension Brother    Heart disease Brother        has a defibrillator   Coronary artery disease Brother        had CABG   Hyperlipidemia Brother    Healthy Child    Diabetes Child    Social History   Socioeconomic History   Marital status: Married    Spouse name: Not on  file   Number of children: 2   Years of education: 11th grade   Highest education level: 11th grade  Occupational History   Occupation: unemployed  Tobacco Use   Smoking status: Every Day    Packs/day: 0.50    Years: 33.00    Total pack years: 16.50    Types: Cigarettes    Start date: 03/04/2017   Smokeless tobacco: Never  Vaping Use   Vaping Use: Never used  Substance and Sexual Activity   Alcohol use: Yes    Comment: rare- drinks an occassional wine cooler   Drug use: No   Sexual activity: Yes    Partners: Male    Birth control/protection: None  Other Topics Concern   Not on file  Social History Narrative   Husband is a Administrator for [employer] and is the sole provider for the family including 2 children (31 yr old and 5 yr old)    Goes to food bank for food. Says "really worried" about water and light bill. Husband, baby, 74 year old, her, and mother all live together.     Patient would [facility] appointment with [facility].       Pt is right handed   Social Determinants of Health   Financial Resource Strain: High Risk (08/02/2017)   Overall Financial Resource Strain (CARDIA)    Difficulty of Paying Living Expenses: Hard  Food Insecurity: Food Insecurity Present (05/21/2021)   Hunger Vital Sign    Worried About Running Out of Food in the Last Year: Sometimes true    Ran Out of Food in the Last Year: Sometimes true  Transportation Needs: No Transportation Needs (05/21/2021)   PRAPARE - Hydrologist (Medical): No    Lack of Transportation (Non-Medical): No  Physical Activity: Insufficiently Active (07/25/2018)   Exercise Vital Sign    Days of Exercise per Week: 2 days    Minutes of Exercise per Session: 60 min  Stress: Stress Concern Present (08/02/2017)   [place]    Feeling of Stress : Very much  Social Connections: Somewhat Isolated (08/02/2017)   Social Connection and Isolation Panel [NHANES]    Frequency of Communication with Friends and Family: More than three times a week    Frequency of Social Gatherings with Friends and Family: Never    Attends Religious Services: Never    Marine scientist or Organizations: No    Attends Music therapist: Never    Marital Status: Married   Allergies  Allergen Reactions   Coconut (Cocos Nucifera) Hives and Swelling    Just coconut ("all coconut")   Amoxicillin Other (See Comments)    "makes me sick" "swelled up everywhere" Has patient had a PCN reaction causing immediate rash, facial/tongue/throat swelling, SOB or lightheadedness with hypotension: No Has patient had a PCN reaction causing severe rash involving mucus membranes or skin necrosis: No Has patient had a PCN reaction that required hospitalization: No Has patient had a PCN reaction occurring within the last 10 years: No If all of the above  answers are "NO", then may proceed with Cephalosporin use.     Penicillins Other (See Comments)    "makes me sick" "swelled up oil" Has patient had a PCN reaction causing immediate rash, facial/tongue/throat swelling, SOB or lightheadedness with hypotension: No Has patient had a PCN reaction causing severe rash involving mucus membranes or skin necrosis: No Has patient had a PCN  reaction that required hospitalization: No Has patient had a PCN reaction occurring within the last 10 years: Yes If all of the above answers are "NO", then may proceed with Cephalosporin use.    Singulair [Montelukast Sodium] Rash    "made heart race"    Medications   (Not in a hospital admission)     Current Facility-Administered Medications:    sodium chloride flush (NS) 0.9 % injection 3 mL, 3 mL, Intravenous, Once, Carrie Mew, MD  Current Outpatient Medications:    acetaminophen (TYLENOL) 500 MG tablet, Take 500 mg by mouth every 6 (six) hours as needed for fever or mild pain., Disp: , Rfl:    albuterol (PROVENTIL HFA) 108 (90 Base) MCG/ACT inhaler, Inhale 2 puffs into the lungs once every 4 (four) hours as needed for wheezing (cough)., Disp: 6.7 g, Rfl: 2   allopurinol (ZYLOPRIM) 100 MG tablet, TAKE ONE TABLET BY MOUTH ONCE DAILY., Disp: 30 tablet, Rfl: 11   amLODipine (NORVASC) 10 MG tablet, Take 1 tablet (10 mg total) by mouth daily., Disp: 30 tablet, Rfl: 0   aspirin EC 81 MG tablet, Take 1 tablet (81 mg total) by mouth once daily. Swallow whole., Disp: 90 tablet, Rfl: 0   atorvastatin (LIPITOR) 80 MG tablet, Take 1 tablet (80 mg total) by mouth daily., Disp: 90 tablet, Rfl: 1   Blood Pressure Monitor MISC, Use as needed to check blood pressure, Disp: 1 each, Rfl: 0   cetirizine (ZYRTEC) 10 MG tablet, TAKE ONE TABLET BY MOUTH ONCE DAILY., Disp: 90 tablet, Rfl: 0   cloNIDine (CATAPRES) 0.3 MG tablet, Take 1 tablet by mouth 3 times daily (morning, noon, and evening)., Disp: 90 tablet, Rfl: 11    clopidogrel (PLAVIX) 75 MG tablet, Take 1 tablet (75 mg total) by mouth once daily., Disp: 90 tablet, Rfl: 1   fluticasone (FLONASE) 50 MCG/ACT nasal spray, USE 1 SPRAY INTO BOTH NOSTRILS 2 TIMES A DAY, Disp: 16 g, Rfl: 0   fluticasone-salmeterol (WIXELA INHUB) 250-50 MCG/ACT AEPB, Inhale one puff into the lungs in the morning and at bedtime, Disp: 180 each, Rfl: 3   furosemide (LASIX) 20 MG tablet, Take 1 tablet (20 mg total) by mouth daily as needed for fluid or edema., Disp: 30 tablet, Rfl: 0   lidocaine (XYLOCAINE) 5 % ointment, Apply 1 application to the affected area(s) topically once daily as needed., Disp: 35.44 g, Rfl: 0   losartan (COZAAR) 50 MG tablet, Take 1 tablet (50 mg total) by mouth daily., Disp: 30 tablet, Rfl: 1   omeprazole (PRILOSEC) 20 MG capsule, TAKE ONE CAPSULE BY MOUTH ONCE DAILY., Disp: 90 capsule, Rfl: 0   potassium chloride (KLOR-CON M) 10 MEQ tablet, Take 1 tablet (10 mEq total) by mouth as needed (potassium). To take whenever you take lasix, Disp: 10 tablet, Rfl: 0   promethazine (PHENERGAN) 25 MG tablet, TAKE 1 TABLET BY MOUTH ONCE EVERY 6 HOURS AS NEEDED FOR NAUSEA OR VOMITING., Disp: 15 tablet, Rfl: 0  Vitals   There were no vitals filed for this visit.   There is no height or weight on file to calculate BMI.  Physical Exam   Exam performed over telemedicine with 2-way video and audio communication and with assistance of bedside RN  Physical Exam Gen: A&O x4, NAD Resp: normal WOB CV: extremities appear well-perfused  Neuro: *MS: A&O x4. Follows multi-step commands.  *Speech: mild dysarthria, no aphasia, able to name and repeat *CN: PERRL 35mm, EOMI, R visual field deficit (chronic) ,  sensation intact, R UMN facial droop, hearing intact to voice *Motor:   Normal bulk.  No tremor, rigidity or bradykinesia. No pronator drift. All extremities appear full-strength and symmetric. *Sensory: SILT. Symmetric. No double-simultaneous extinction.  *Coordination:   Finger-to-nose, heel-to-shin, rapid alternating motions were intact. *Reflexes:  UTA 2/2 tele-exam *Gait: deferred  NIHSS = 3 for facial droop, dysarthria, R visual field deficit  Premorbid mRS = 2   Labs   CBC: No results for input(s): "WBC", "NEUTROABS", "HGB", "HCT", "MCV", "PLT" in the last 168 hours.  Basic Metabolic Panel:  Lab Results  Component Value Date   NA 140 12/02/2021   K 4.3 12/02/2021   CO2 23 12/02/2021   GLUCOSE 102 (H) 12/02/2021   BUN 24 (H) 12/02/2021   CREATININE 1.66 (H) 12/02/2021   CALCIUM 8.8 (L) 12/02/2021   GFRNONAA 38 (L) 12/02/2021   GFRAA 35 (L) 01/10/2020   Lipid Panel:  Lab Results  Component Value Date   LDLCALC 78 08/20/2021   HgbA1c:  Lab Results  Component Value Date   HGBA1C 5.7 (H) 05/21/2021   Urine Drug Screen:     Component Value Date/Time   LABOPIA NONE DETECTED 12/01/2019 0134   COCAINSCRNUR NONE DETECTED 12/01/2019 0134   COCAINSCRNUR NEGATIVE 07/19/2011 1605   LABBENZ NONE DETECTED 12/01/2019 0134   AMPHETMU NONE DETECTED 12/01/2019 0134   THCU NONE DETECTED 12/01/2019 0134   LABBARB NONE DETECTED 12/01/2019 0134    Alcohol Level     Component Value Date/Time   ETH <10 11/30/2019 1502     Impression   This is a 52 yo woman with hx multiple prior strokes, prior MI, HTN who presents with R facial droop and slurred speech c/f acute ischemic stroke. NIHSS = 3 including one point for chronic R visual field deficit. CT head showed no acute abnl. TNK not administered 2/2 sx being too mild to treat.  Recommendations   - Recommend close monitoring in ED with vital signs and NIHSS both q 30 min until outside of the TNK window at 2115.  If neurologic exam is worsens a stroke code should be immediately reactivated.  If exam is stable or improved at 2115 patient should at that point be admitted to the hospitalist service for stroke/TIA work-up. - Permissive HTN x48 hrs from sx onset or until stroke ruled out by MRI goal BP  <220/110. PRN labetalol or hydralazine if BP above these parameters. Avoid oral antihypertensives. - MRI brain wo contrast - CTA/MRA H&N - TTE w/ bubble - Check A1c and LDL + add statin per guidelines - Continue home ASA 81mg  daily + plavix 75mg  daily  - q4 hr neuro checks - STAT head CT for any change in neuro exam - Tele - PT/OT/SLP - Stroke education - Amb referral to neurology upon discharge    ______________________________________________________________________   Thank you for the opportunity to take part in the care of this patient. If you have any further questions, please contact the neurology consultation attending.  Signed,  Su Monks, MD [facility] [phone]  If 7pm- 7am, please page neurology on call as listed in [place].  **Any copied and pasted documentation in this note was written by me in another application not billed for and pasted by me into this document.

## 2022-01-09 NOTE — ED Provider Notes (Signed)
[facility] Provider Note    Event Date/Time   First MD Initiated Contact with Patient 01/09/22 1744     (approximate)   History   Chief Complaint: Code Stroke   HPI  Gabrielle Schultz is a 52 y.o. female with a history of multiple prior strokes, hypertension, CKD who comes the ED with dysarthria and right-sided facial droop that started at 4:30 PM tonight.  No trauma.  No seizure.  Code stroke initiated on arrival.     Physical Exam   Triage Vital Signs: ED Triage Vitals  Enc Vitals Group     BP 01/09/22 1800 (!) 180/44     Pulse Rate 01/09/22 1800 (!) 53     Resp 01/09/22 1800 15     Temp 01/09/22 1827 97.6 F (36.4 C)     Temp Source 01/09/22 1827 Oral     SpO2 01/09/22 1800 94 %     Weight --      Height --      Head Circumference --      Peak Flow --      Pain Score --      Pain Loc --      Pain Edu? --      Excl. in [place]? --     Most recent vital signs: Vitals:   01/09/22 2033 01/09/22 2100  BP: (!) 187/42 (!) 182/78  Pulse: (!) 54 61  Resp: 11 (!) 23  Temp:    SpO2: 100% 100%    General: Awake, no distress.  Maintaining airway CV:  Good peripheral perfusion.  Regular rate rhythm Resp:  Normal effort.  Clear to auscultation bilaterally Abd:  No distention.  Soft nontender Other:  Dysarthria, right facial droop.  Moving all extremities.  No signs of trauma.   ED Results / Procedures / Treatments   Labs (all labs ordered are listed, but only abnormal results are displayed) Labs Reviewed  CBC - Abnormal; Notable for the following components:      Result Value   RBC 3.57 (*)    Hemoglobin 11.7 (*)    HCT 35.7 (*)    RDW 15.8 (*)    All other components within normal limits  COMPREHENSIVE METABOLIC PANEL - Abnormal; Notable for the following components:   Creatinine, Ser 1.69 (*)    AST 14 (*)    GFR, Estimated 37 (*)    All other components within normal limits  PROTIME-INR  APTT  DIFFERENTIAL  ETHANOL  CBG  MONITORING, ED  POC URINE PREG, ED     EKG Interpreted by me Sinus rhythm rate of 56.  Normal axis, normal intervals.  Normal QRS and ST segments.  Diffuse inferolateral T wave inversions.  T wave inversions appear new compared to prior EKG on December 04, 2021.   RADIOLOGY CT head interpreted by me, negative for hemorrhage.  Radiology report reviewed     PROCEDURES:  .Critical Care  Performed by: Carrie Mew, MD Authorized by: Carrie Mew, MD   Critical care provider statement:    Critical care time (minutes):  35   Critical care time was exclusive of:  Separately billable procedures and treating other patients   Critical care was necessary to treat or prevent imminent or life-threatening deterioration of the following conditions:  CNS failure or compromise   Critical care was time spent personally by me on the following activities:  Development of treatment plan with patient or surrogate, discussions with consultants, evaluation of patient's  response to treatment, examination of patient, obtaining history from patient or surrogate, ordering and performing treatments and interventions, ordering and review of laboratory studies, ordering and review of radiographic studies, pulse oximetry, re-evaluation of patient's condition and review of old charts   Care discussed with: admitting provider      [place] ED: Medications  sodium chloride flush (NS) 0.9 % injection 3 mL (3 mLs Intravenous Given 01/09/22 1747)     IMPRESSION / MDM / [place] / ED COURSE  I reviewed the triage vital signs and the nursing notes.                              Differential diagnosis includes, but is not limited to, acute ischemic stroke,, dehydration, electrolyte abnormality  Patient's presentation is most consistent with acute presentation with potential threat to life or bodily function.    Clinical Course as of 01/09/22 2110  Sat Jan 09, 2022  1744  Arrives with dysarthria, LKW 16:30. Code stroke initiated [PS]  1804 CT head discussed with radiologist, stable chronic infarcts, no acute findings.  Presentation discussed with neurology Dr. Quinn Axe who recommends observing in ED until out of TNK window which would be 9:00 PM, at which point patient can be admitted for further stroke workup and management. [PS]    Clinical Course User Index [PS] Carrie Mew, MD    ----------------------------------------- 11:39 PM on 01/09/2022 ----------------------------------------- Symptoms remain stable through end of thrombolysis window at 9:00 PM.  Case discussed with hospitalist for further management of acute stroke   FINAL CLINICAL IMPRESSION(S) / ED DIAGNOSES   Final diagnoses:  Acute ischemic stroke [facility])     Rx / DC Orders   ED Discharge Orders     None        Note:  This document was prepared using Dragon voice recognition software and may include unintentional dictation errors.   Carrie Mew, MD 01/09/22 2340

## 2022-01-09 NOTE — Consult Note (Signed)
Code stroke cart activated at 17:37 EMS pre alert Pt arrived at 17:41 [facility] teleneuro paged at 17:43 Dr Quinn Axe on screen at 17:43 Patient to [place] at 17:44 M[facility] 2

## 2022-01-09 NOTE — ED Notes (Signed)
Pt to CT at this time.

## 2022-01-09 NOTE — ED Triage Notes (Signed)
Arrives from home via [place]:  1630.  Patient with slurred speech and right side facial droop.  Hx of 5 strokes in the past.  Dr. Joni Fears at bedside.  Patient to CT

## 2022-01-10 ENCOUNTER — Other Ambulatory Visit: Payer: Self-pay

## 2022-01-10 ENCOUNTER — Inpatient Hospital Stay: Payer: Medicaid Other

## 2022-01-10 ENCOUNTER — Encounter: Payer: Self-pay | Admitting: Internal Medicine

## 2022-01-10 ENCOUNTER — Inpatient Hospital Stay (HOSPITAL_BASED_OUTPATIENT_CLINIC_OR_DEPARTMENT_OTHER)
Admit: 2022-01-10 | Discharge: 2022-01-10 | Disposition: A | Discharge status: Home / Self Care | Payer: Medicaid Other | Attending: Internal Medicine | Admitting: Internal Medicine

## 2022-01-10 DIAGNOSIS — G9341 Metabolic encephalopathy: Secondary | ICD-10-CM | POA: Diagnosis not present

## 2022-01-10 DIAGNOSIS — I351 Nonrheumatic aortic (valve) insufficiency: Secondary | ICD-10-CM | POA: Diagnosis not present

## 2022-01-10 DIAGNOSIS — I69392 Facial weakness following cerebral infarction: Secondary | ICD-10-CM | POA: Diagnosis not present

## 2022-01-10 DIAGNOSIS — R29810 Facial weakness: Secondary | ICD-10-CM | POA: Diagnosis not present

## 2022-01-10 DIAGNOSIS — E785 Hyperlipidemia, unspecified: Secondary | ICD-10-CM | POA: Diagnosis not present

## 2022-01-10 DIAGNOSIS — I6389 Other cerebral infarction: Secondary | ICD-10-CM | POA: Diagnosis not present

## 2022-01-10 DIAGNOSIS — J449 Chronic obstructive pulmonary disease, unspecified: Secondary | ICD-10-CM | POA: Diagnosis not present

## 2022-01-10 DIAGNOSIS — R4781 Slurred speech: Secondary | ICD-10-CM | POA: Diagnosis not present

## 2022-01-10 DIAGNOSIS — I10 Essential (primary) hypertension: Secondary | ICD-10-CM | POA: Diagnosis not present

## 2022-01-10 LAB — URINALYSIS, ROUTINE W REFLEX MICROSCOPIC
Bilirubin Urine: NEGATIVE
Glucose, UA: NEGATIVE mg/dL
Hgb urine dipstick: NEGATIVE
Ketones, ur: NEGATIVE mg/dL
Leukocytes,Ua: NEGATIVE
Nitrite: NEGATIVE
Protein, ur: 100 mg/dL — AB
Specific Gravity, Urine: 1.005 (ref 1.005–1.030)
pH: 7 (ref 5.0–8.0)

## 2022-01-10 LAB — ECHOCARDIOGRAM COMPLETE
AR max vel: 1.38 cm2
AV Area VTI: 1.47 cm2
AV Area mean vel: 1.37 cm2
AV Mean grad: 16.6 mmHg
AV Peak grad: 36.7 mmHg
Ao pk vel: 3.03 m/s
Area-P 1/2: 3.93 cm2
Calc EF: 56.1 %
Height: 61 in
MV M vel: 5.41 m/s
MV Peak grad: 117.1 mmHg
P 1/2 time: 381 msec
S' Lateral: 4.2 cm
Single Plane A2C EF: 54.6 %
Single Plane A4C EF: 55.6 %
Weight: 2576 oz

## 2022-01-10 LAB — LIPID PANEL
Cholesterol: 112 mg/dL (ref 0–200)
HDL: 21 mg/dL — ABNORMAL LOW (ref >40)
LDL Cholesterol: 63 mg/dL (ref 0–99)
Total CHOL/HDL Ratio: 5.3 RATIO
Triglycerides: 142 mg/dL (ref <150)
VLDL: 28 mg/dL (ref 0–40)

## 2022-01-10 MED ORDER — SENNOSIDES-DOCUSATE SODIUM 8.6-50 MG PO TABS: 1.0000 | ORAL_TABLET | Freq: Every evening | ORAL | Status: DC | PRN

## 2022-01-10 MED ORDER — CLOPIDOGREL BISULFATE 75 MG PO TABS
75.0000 mg | ORAL_TABLET | Freq: Every day | ORAL | Status: DC
  Administered 2022-01-10: 75 mg via ORAL
  Filled 2022-01-10: qty 1

## 2022-01-10 MED ORDER — AMLODIPINE BESYLATE 5 MG PO TABS: 5.0000 mg | ORAL_TABLET | Freq: Once | ORAL | Status: DC

## 2022-01-10 MED ORDER — SODIUM CHLORIDE 0.9 % IV SOLN: INTRAVENOUS | Status: DC

## 2022-01-10 MED ORDER — CETIRIZINE HCL 10 MG PO TABS: ORAL_TABLET | Freq: Every day | ORAL | 0 refills | Status: DC

## 2022-01-10 MED ORDER — POTASSIUM CHLORIDE CRYS ER 10 MEQ PO TBCR: 10.0000 meq | EXTENDED_RELEASE_TABLET | ORAL | 0 refills | Status: DC | PRN

## 2022-01-10 MED ORDER — LOSARTAN POTASSIUM 50 MG PO TABS: 50.0000 mg | ORAL_TABLET | Freq: Every day | ORAL | 0 refills | Status: DC

## 2022-01-10 MED ORDER — FLUTICASONE PROPIONATE 50 MCG/ACT NA SUSP: NASAL | 0 refills | Status: DC

## 2022-01-10 MED ORDER — ACETAMINOPHEN 325 MG RE SUPP: 650.0000 mg | RECTAL | Status: DC | PRN

## 2022-01-10 MED ORDER — ACETAMINOPHEN 160 MG/5ML PO SOLN: 650.0000 mg | ORAL | Status: DC | PRN

## 2022-01-10 MED ORDER — AMLODIPINE BESYLATE 10 MG PO TABS: 10.0000 mg | ORAL_TABLET | Freq: Every day | ORAL | 0 refills | Status: DC

## 2022-01-10 MED ORDER — CLONIDINE HCL 0.3 MG PO TABS: 0.3000 mg | ORAL_TABLET | Freq: Three times a day (TID) | ORAL | 0 refills | Status: DC

## 2022-01-10 MED ORDER — PANTOPRAZOLE SODIUM 40 MG PO TBEC: 40.0000 mg | DELAYED_RELEASE_TABLET | Freq: Every day | ORAL | 0 refills | Status: DC

## 2022-01-10 MED ORDER — ATORVASTATIN CALCIUM 20 MG PO TABS: 40.0000 mg | ORAL_TABLET | Freq: Every day | ORAL | Status: DC

## 2022-01-10 MED ORDER — OMEPRAZOLE 20 MG PO CPDR: DELAYED_RELEASE_CAPSULE | Freq: Every day | ORAL | 0 refills | Status: DC

## 2022-01-10 MED ORDER — FLUTICASONE-SALMETEROL 250-50 MCG/ACT IN AEPB: INHALATION_SPRAY | RESPIRATORY_TRACT | 0 refills | Status: DC

## 2022-01-10 MED ORDER — LORAZEPAM 0.5 MG PO TABS: 0.5000 mg | ORAL_TABLET | Freq: Once | ORAL | Status: DC | PRN

## 2022-01-10 MED ORDER — AMLODIPINE BESYLATE 5 MG PO TABS
5.0000 mg | ORAL_TABLET | Freq: Every day | ORAL | Status: DC
  Administered 2022-01-10: 5 mg via ORAL
  Filled 2022-01-10: qty 1

## 2022-01-10 MED ORDER — FUROSEMIDE 20 MG PO TABS: 20.0000 mg | ORAL_TABLET | Freq: Every day | ORAL | 0 refills | Status: DC | PRN

## 2022-01-10 MED ORDER — ATORVASTATIN CALCIUM 80 MG PO TABS: 80.0000 mg | ORAL_TABLET | Freq: Every day | ORAL | 0 refills | Status: DC

## 2022-01-10 MED ORDER — ACETAMINOPHEN 325 MG PO TABS: 650.0000 mg | ORAL_TABLET | ORAL | Status: DC | PRN

## 2022-01-10 MED ORDER — LORAZEPAM 2 MG/ML IJ SOLN
0.5000 mg | Freq: Once | INTRAMUSCULAR | Status: AC | PRN
  Administered 2022-01-10: 0.5 mg via INTRAVENOUS
  Filled 2022-01-10: qty 1

## 2022-01-10 MED ORDER — CLOPIDOGREL BISULFATE 75 MG PO TABS: 75.0000 mg | ORAL_TABLET | Freq: Every day | ORAL | 0 refills | Status: DC

## 2022-01-10 MED ORDER — STROKE: EARLY STAGES OF RECOVERY BOOK: Freq: Once | Status: AC

## 2022-01-10 MED ORDER — ENOXAPARIN SODIUM 40 MG/0.4ML IJ SOSY
0.5000 mg/kg | PREFILLED_SYRINGE | INTRAMUSCULAR | Status: DC
  Administered 2022-01-10: 37.5 mg via SUBCUTANEOUS
  Filled 2022-01-10: qty 0.4

## 2022-01-10 MED ORDER — ALBUTEROL SULFATE HFA 108 (90 BASE) MCG/ACT IN AERS: 2.0000 | INHALATION_SPRAY | RESPIRATORY_TRACT | 0 refills | Status: DC | PRN

## 2022-01-10 MED ORDER — ALLOPURINOL 100 MG PO TABS: ORAL_TABLET | Freq: Every day | ORAL | 0 refills | Status: DC

## 2022-01-10 MED ORDER — ASPIRIN 81 MG PO TBEC: 81.0000 mg | DELAYED_RELEASE_TABLET | Freq: Every day | ORAL | 0 refills | Status: DC

## 2022-01-10 NOTE — Evaluation (Signed)
Occupational Therapy Evaluation Patient Details Name: Gabrielle Schultz MRN: [id number] DOB: February 20, 1970 Today's Date: 01/10/2022   History of Present Illness Pt is a 52 year old female presenting to the ED with right facial droop and slurred speech; MRI shows no acute finding, multiple chronic infarcts; PMH significant for multiple prior strokes, prior MI, HTN   Clinical Impression   Chart reviewed, nurse cleared pt for participation in OT evaluation. Pt is alert and oriented x4, reports she feels at/close to baseline functional status. Pt requires intermittent assist from family for dressing, assist for IADLs at baseline. Pt reports she amb household distances with no AD, however has been using her RW as needed. At this time, is appears pt is performing ADL at or close to baseline, strength is at/close to baseline. Encouraged pt to continue mobility efforts while admitted. No further OT needs identified. Please reconsult if there is a change in functional status.      Recommendations for follow up therapy are one component of a multi-disciplinary discharge planning process, led by the attending physician.  Recommendations may be updated based on patient status, additional functional criteria and insurance authorization.   Follow Up Recommendations  No OT follow up     Assistance Recommended at Discharge Intermittent Supervision/Assistance  Patient can return home with the following A little help with bathing/dressing/bathroom;Assistance with cooking/housework    Functional Status Assessment  Patient has had a recent decline in their functional status and demonstrates the ability to make significant improvements in function in a reasonable and predictable amount of time.  Equipment Recommendations  None recommended by OT    Recommendations for Other Services       Precautions / Restrictions Precautions Precautions: Fall      Mobility Bed Mobility Overal bed mobility: Needs  Assistance Bed Mobility: Supine to Sit, Sit to Supine     Supine to sit: Modified independent (Device/Increase time), HOB elevated Sit to supine: Modified independent (Device/Increase time), HOB elevated        Transfers Overall transfer level: Modified independent Equipment used: Rolling walker (2 wheels), None               General transfer comment: MOD I with RW, supervision without      Balance Overall balance assessment: Needs assistance Sitting-balance support: Feet supported Sitting balance-Leahy Scale: Good     Standing balance support: Bilateral upper extremity supported, During functional activity Standing balance-Leahy Scale: Good                             ADL either performed or assessed with clinical judgement   ADL Overall ADL's : At baseline;Needs assistance/impaired     Grooming: Wash/dry hands;Standing;Supervision/safety               Lower Body Dressing: Supervision/safety;Set up Lower Body Dressing Details (indicate cue type and reason): shoes Toilet Transfer: Supervision/safety Toilet Transfer Details (indicate cue type and reason): amb with RW, regular toilet height Toileting- Clothing Manipulation and Hygiene: Modified independent;Sit to/from stand       Functional mobility during ADLs: Supervision/safety;Rolling walker (2 wheels) (with RW approx 100')       Vision Patient Visual Report: No change from baseline       Perception     Praxis      Pertinent Vitals/Pain Pain Assessment Pain Assessment: No/denies pain     Hand Dominance Right   Extremity/Trunk Assessment Upper Extremity Assessment Upper Extremity Assessment:  RUE deficits/detail;Overall WFL for tasks assessed RUE Deficits / Details: mild R hand grip strength deficits, strength grossly 3+-4-/5, pt reports this is baseline   Lower Extremity Assessment Lower Extremity Assessment: RLE deficits/detail RLE Deficits / Details: mild deficits noted in  RLE       Communication Communication Communication: No difficulties   Cognition Arousal/Alertness: Awake/alert Behavior During Therapy: WFL for tasks assessed/performed Overall Cognitive Status: Within Functional Limits for tasks assessed                                       General Comments  vital signs monitored, appear stable throughout    Exercises     Shoulder Instructions      Home Living Family/patient expects to be discharged to:: Private residence Living Arrangements: Spouse/significant other Available Help at Discharge: Family;Available 24 hours/day Type of Home: House Home Access: Level entry     Home Layout: One level     Bathroom Shower/Tub: Tub/shower unit;Curtain   Bathroom Toilet: Handicapped height     Home Equipment: [place] (2 wheels);Shower seat      Lives With: Spouse;Family    Prior Functioning/Environment Prior Level of Function : Needs assist             Mobility Comments: amb with cane for community distances per pt report ADLs Comments: family assists with IADLs (dishes, cooking, cleaning) as needed, assit for dressing as needed from sons girlfriend        OT Problem List:        OT Treatment/Interventions:      OT Goals(Current goals can be found in the care plan section) Acute Rehab OT Goals Patient Stated Goal: go home OT Goal Formulation: With patient Time For Goal Achievement: 01/24/22 Potential to Achieve Goals: Good  OT Frequency:      Co-evaluation              AM-PAC OT "6 Clicks" Daily Activity     Outcome Measure Help from another person eating meals?: None Help from another person taking care of personal grooming?: None Help from another person toileting, which includes using toliet, bedpan, or urinal?: None Help from another person bathing (including washing, rinsing, drying)?: None Help from another person to put on and taking off regular upper body clothing?:  None Help from another person to put on and taking off regular lower body clothing?: None 6 Click Score: 24   End of Session Equipment Utilized During Treatment: Rolling walker (2 wheels) Nurse Communication: Mobility status  Activity Tolerance: Patient tolerated treatment well Patient left: in bed                   Time: 1129-1141 OT Time Calculation (min): 12 min Charges:  OT General Charges $OT Visit: 1 Visit OT Evaluation $OT Eval Low Complexity: 1 Low  Shanon Payor, OTD OTR/L  01/10/22, 12:27 PM

## 2022-01-10 NOTE — ED Notes (Signed)
Pt to echo.

## 2022-01-10 NOTE — Progress Notes (Signed)
PT Cancellation Note  Patient Details Name: Gabrielle Schultz MRN: [id number] DOB: 1970/10/15   Cancelled Treatment:     PT evaluation and treatment not completed 2/2 to pt refused PT intervention due pt is at her baseline. PT/OT communicated and OT reported that pt is at her baseline. Pt requested home OT and home health aide for ADLs which OT plans to address during her evaluation process. Pt's nurse also reported of no need for PT intervention at this point. Hence, pt does not see need for skilled interventions at this point. PT will consult if the pt show decline in her medical and functional status. Joaquin Music PT DPT 3:16 PM,01/10/22

## 2022-01-10 NOTE — Discharge Summary (Signed)
Physician Discharge Summary   Patient: Gabrielle Schultz MRN: [id number] DOB: 1970/02/25  Admit date:     01/09/2022  Discharge date: 01/10/22  Discharge Physician: Loletha Grayer   PCP: Ranae Plumber, PA   Recommendations at discharge:   Follow-up with Dr. On your Medicaid card Follow-up Dr. 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  She presented with some altered mental status and right facial droop and slurred speech.  A code stroke was called.  CT and MRI of the brain were negative.  Echocardiogram shows an EF of 60 to 65%, mild to moderate mitral valve regurgitation, moderate to severe aortic valve regurgitation.  Neurology cleared to go home.  She believes that this is recrudescing prior stroke symptoms.  This is not an acute stroke.  Recommended aspirin, Plavix and high-dose Lipitor indefinitely.  No need for carotid testing.  Recommend following up with cardiology as outpatient and they can consider TEE for further evaluation of heart valve.  Refilled all of her medications.  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  Neurology recommends aspirin and Plavix and statin indefinitely.  Aortic regurgitation Follow-up with Dr. [name] cardiology  Chronic kidney disease CKD stage IIIa.  Creatinine 1.69  Benign essential hypertension Continue clonidine, Norvasc and can restart Lasix as  outpatient.  Acute metabolic encephalopathy Improved  COPD (chronic obstructive pulmonary disease) (HCC) Refilled inhalers  Hyperlipidemia LDL goal <70 LDL 63.  Continue high-dose Lipitor         Consultants: Neurology Procedures performed: None Disposition: Home Diet recommendation:  Cardiac diet DISCHARGE MEDICATION: Allergies as of 01/10/2022       Reactions   Coconut (cocos Nucifera) Hives, Swelling   Just coconut ("all coconut")   Amoxicillin Other (See Comments)   "makes me sick" "swelled up everywhere" Has patient had a PCN reaction causing immediate rash, facial/tongue/throat swelling, SOB or lightheadedness with hypotension: No Has patient had a PCN reaction causing severe rash involving mucus membranes or skin necrosis: No Has patient had a PCN reaction that required hospitalization: No Has patient had a PCN reaction occurring within the last 10 years: No If all of the above answers are "NO", then may proceed with Cephalosporin use.   Penicillins Other (See Comments)   "makes me sick" "swelled up oil" Has patient had a PCN reaction causing immediate rash, facial/tongue/throat swelling, SOB or lightheadedness with hypotension: No Has patient had a PCN reaction causing severe rash involving mucus membranes or skin necrosis: No Has patient had a PCN reaction that required hospitalization: No Has patient had a PCN reaction occurring within the last 10 years: Yes If all of the above answers are "NO", then may proceed with Cephalosporin use.   Singulair [montelukast Sodium] Rash   "made heart race"        Medication List     STOP taking these medications    lidocaine 5 % ointment Commonly known as: XYLOCAINE   omeprazole 20 MG capsule Commonly known as: PRILOSEC  promethazine 25 MG tablet Commonly known as: PHENERGAN       TAKE these medications    acetaminophen 500 MG tablet Commonly known as: TYLENOL Take 500 mg by mouth every 6 (six) hours as  needed for fever or mild pain.   albuterol 108 (90 Base) MCG/ACT inhaler Commonly known as: Proventil HFA Inhale 2 puffs into the lungs once every 4 (four) hours as needed for wheezing (cough).   allopurinol 100 MG tablet Commonly known as: ZYLOPRIM TAKE ONE TABLET BY MOUTH ONCE DAILY.   amLODipine 10 MG tablet Commonly known as: NORVASC Take 1 tablet (10 mg total) by mouth daily.   aspirin EC 81 MG tablet Take 1 tablet (81 mg total) by mouth once daily. Swallow whole.   atorvastatin 80 MG tablet Commonly known as: LIPITOR Take 1 tablet (80 mg total) by mouth daily.   Blood Pressure Monitor Misc Use as needed to check blood pressure   cetirizine 10 MG tablet Commonly known as: ZYRTEC TAKE ONE TABLET BY MOUTH ONCE DAILY.   cloNIDine 0.3 MG tablet Commonly known as: CATAPRES Take 1 tablet by mouth 3 times daily (morning, noon, and evening).   clopidogrel 75 MG tablet Commonly known as: PLAVIX Take 1 tablet (75 mg total) by mouth once daily.   fluticasone 50 MCG/ACT nasal spray Commonly known as: FLONASE USE 1 SPRAY INTO BOTH NOSTRILS 2 TIMES A DAY   fluticasone-salmeterol 250-50 MCG/ACT Aepb Commonly known as: Wixela Inhub Inhale one puff into the lungs in the morning and at bedtime   furosemide 20 MG tablet Commonly known as: LASIX Take 1 tablet (20 mg total) by mouth daily as needed for fluid or edema.   losartan 50 MG tablet Commonly known as: COZAAR Take 1 tablet (50 mg total) by mouth daily.   pantoprazole 40 MG tablet Commonly known as: PROTONIX Take 1 tablet (40 mg total) by mouth daily. Start taking on: January 11, 2022   potassium chloride 10 MEQ tablet Commonly known as: KLOR-CON M Take 1 tablet (10 mEq total) by mouth as needed (potassium). To take whenever you take lasix        Follow-up Information     your medical doctor on your medicaid card Follow up in 1 week(s).          [name], MD Follow up in 2 week(s).   Specialty:  Cardiology Contact information: [address] [address] [phone]                Discharge Exam: Danley Danker Weights   01/10/22 0455 01/10/22 0716  Weight: 73 kg 73 kg   Physical Exam HENT:     Head: Normocephalic.     Mouth/Throat:     Pharynx: No oropharyngeal exudate.  Eyes:     General: Lids are normal.     Conjunctiva/sclera: Conjunctivae normal.  Cardiovascular:     Rate and Rhythm: Normal rate and regular rhythm.     Heart sounds: S1 normal and S2 normal. Murmur heard.     No systolic murmur is present.     Diastolic murmur is present with a grade of 2/4.  Pulmonary:     Breath sounds: No decreased breath sounds, wheezing, rhonchi or rales.  Abdominal:     Palpations: Abdomen is soft.     Tenderness: There is no abdominal tenderness.  Musculoskeletal:     Right lower leg: No swelling.     Left lower leg: No swelling.  Skin:    General: Skin is warm.     Findings: No rash.  Neurological:     Mental Status: She is alert and oriented to person, place, and time.     Comments: Chronic right leg weakness.      Condition at discharge: stable  The results of significant diagnostics from this hospitalization (including imaging, microbiology, ancillary and laboratory) are listed below for reference.   Imaging Studies: ECHOCARDIOGRAM COMPLETE  Result Date: 01/10/2022    ECHOCARDIOGRAM REPORT   Patient Name:   [name] Date of Exam: 01/10/2022 Medical Rec #:  [id number]      Height:       61.0 in Accession #:    [id number]     Weight:       161.0 lb Date of Birth:  01-Nov-1970     BSA:          1.722 m Patient Age:    17 years       BP:           169/61 mmHg Patient Gender: F              HR:           58 bpm. Exam Location:  [facility] Procedure: 2D Echo and Strain Analysis Indications:     Stroke I63.9  History:         Patient has prior history of Echocardiogram examinations, most                  recent 08/21/2021.  Sonographer:     Kathlen Brunswick RDCS Referring Phys:  [id number] Elwyn Reach Diagnosing Phys: Fransico Him MD  Sonographer Comments: Global longitudinal strain was attempted. IMPRESSIONS  1. Left ventricular ejection fraction, by estimation, is 60 to 65%. The left ventricle has normal function. The left ventricle has no regional wall motion abnormalities. The left ventricular internal cavity size was mildly dilated. There is mild concentric left ventricular hypertrophy. Left ventricular diastolic parameters are indeterminate. The average left ventricular global longitudinal strain is -18.8 %. The global longitudinal strain is normal.  2. Right ventricular systolic function is normal. The right ventricular size is normal. Tricuspid regurgitation signal is inadequate for assessing PA pressure.  3. The mitral valve is degenerative. Mild to moderate mitral valve regurgitation. No evidence of mitral stenosis.  4. The aortic valve has an indeterminant number of cusps. There is moderate calcification of the aortic valve. There is moderate thickening of the aortic valve. Aortic valve regurgitation is moderate to severe. Mild to moderate aortic valve stenosis. Aortic regurgitation PHT measures 381 msec. Aortic valve area, by VTI measures 1.47 cm. Aortic valve mean gradient measures 16.6 mmHg. Aortic valve Vmax measures 3.03 m/s.  5. The inferior vena cava is normal in size with greater than 50% respiratory variability, suggesting right atrial pressure of 3 mmHg.  6. Consider further workup with TEE to assess AI further. FINDINGS  Left Ventricle: Left ventricular ejection fraction, by estimation, is 60 to 65%. The left ventricle has normal function. The left ventricle has no regional wall motion abnormalities. The average left ventricular global longitudinal strain is -18.8 %. The global longitudinal strain is normal. The left ventricular internal cavity size was mildly dilated. There is mild concentric left ventricular hypertrophy. Left ventricular  diastolic parameters are indeterminate. Normal left ventricular filling pressure. Right Ventricle: The right ventricular size is normal. No increase in right ventricular wall thickness. Right ventricular systolic function is normal. Tricuspid  regurgitation signal is inadequate for assessing PA pressure. Left Atrium: Left atrial size was normal in size. Right Atrium: Right atrial size was normal in size. Pericardium: There is no evidence of pericardial effusion. Mitral Valve: The mitral valve is degenerative in appearance. There is mild thickening of the mitral valve leaflet(s). There is mild calcification of the mitral valve leaflet(s). Mild to moderate mitral valve regurgitation. No evidence of mitral valve stenosis. Tricuspid Valve: The tricuspid valve is normal in structure. Tricuspid valve regurgitation is trivial. No evidence of tricuspid stenosis. Aortic Valve: The aortic valve has an indeterminant number of cusps. There is moderate calcification of the aortic valve. There is moderate thickening of the aortic valve. Aortic valve regurgitation is moderate to severe. Aortic regurgitation PHT measures 381 msec. Mild to moderate aortic stenosis is present. Aortic valve mean gradient measures 16.6 mmHg. Aortic valve peak gradient measures 36.7 mmHg. Aortic valve area, by VTI measures 1.47 cm. Pulmonic Valve: The pulmonic valve was normal in structure. Pulmonic valve regurgitation is mild. No evidence of pulmonic stenosis. Aorta: The aortic root is normal in size and structure. Venous: The inferior vena cava is normal in size with greater than 50% respiratory variability, suggesting right atrial pressure of 3 mmHg. IAS/Shunts: No atrial level shunt detected by color flow Doppler.  LEFT VENTRICLE PLAX 2D LVIDd:         5.70 cm      Diastology LVIDs:         4.20 cm      LV e' medial:    5.87 cm/s LV PW:         1.30 cm      LV E/e' medial:  13.7 LV IVS:        1.20 cm      LV e' lateral:   11.50 cm/s LVOT diam:      1.90 cm      LV E/e' lateral: 7.0 LV SV:         97 LV SV Index:   56           2D Longitudinal Strain LVOT Area:     2.84 cm     2D Strain GLS (A2C):   -16.8 %                             2D Strain GLS (A3C):   -23.8 %                             2D Strain GLS (A4C):   -15.8 % LV Volumes (MOD)            2D Strain GLS Avg:     -18.8 % LV vol d, MOD A2C: 117.0 ml LV vol d, MOD A4C: 108.0 ml LV vol s, MOD A2C: 53.1 ml LV vol s, MOD A4C: 48.0 ml LV SV MOD A2C:     63.9 ml LV SV MOD A4C:     108.0 ml LV SV MOD BP:      64.8 ml RIGHT VENTRICLE RV Basal diam:  2.10 cm RV S prime:     12.40 cm/s TAPSE (M-mode): 1.6 cm LEFT ATRIUM             Index        RIGHT ATRIUM          Index LA diam:  4.25 cm 2.47 cm/m   RA Area:     9.67 cm LA Vol (A2C):   33.9 ml 19.68 ml/m  RA Volume:   19.70 ml 11.44 ml/m LA Vol (A4C):   56.6 ml 32.86 ml/m LA Biplane Vol: 43.8 ml 25.43 ml/m  AORTIC VALVE                     PULMONIC VALVE AV Area (Vmax):    1.38 cm      PV Vmax:       1.07 m/s AV Area (Vmean):   1.37 cm      PV Peak grad:  4.6 mmHg AV Area (VTI):     1.47 cm AV Vmax:           303.00 cm/s AV Vmean:          184.400 cm/s AV VTI:            0.659 m AV Peak Grad:      36.7 mmHg AV Mean Grad:      16.6 mmHg LVOT Vmax:         147.00 cm/s LVOT Vmean:        89.000 cm/s LVOT VTI:          0.343 m LVOT/AV VTI ratio: 0.52 AI PHT:            381 msec  AORTA Ao Root diam: 2.50 cm MITRAL VALVE MV Area (PHT): 3.93 cm    SHUNTS MV Decel Time: 193 msec    Systemic VTI:  0.34 m MR Peak grad: 117.1 mmHg   Systemic Diam: 1.90 cm MR Mean grad: 86.0 mmHg MR Vmax:      541.00 cm/s MR Vmean:     447.0 cm/s MV E velocity: 80.20 cm/s MV A velocity: 48.20 cm/s MV E/A ratio:  1.66 Fransico Him MD Electronically signed by Fransico Him MD Signature Date/Time: 01/10/2022/1:41:35 PM    Final    MR BRAIN WO CONTRAST  Result Date: 01/10/2022 CLINICAL DATA:  Slurred speech and right facial droop. History of stroke EXAM: MRI HEAD WITHOUT  CONTRAST TECHNIQUE: Multiplanar, multiecho pulse sequences of the brain and surrounding structures were obtained without intravenous contrast. COMPARISON:  Head CT from yesterday FINDINGS: Brain: Chronic cortical infarcts at the left occipital, right parietal, and lateral left frontal cortex. Small chronic cerebellar infarcts. Chronic small vessel ischemia in the cerebral white matter. Cerebral volume loss especially affecting the posterior cerebral cortex. No acute infarct, hemorrhage, hydrocephalus, collection, or masslike finding Vascular: Normal flow voids. Skull and upper cervical spine: Normal marrow signal. Sinuses/Orbits: Negative. IMPRESSION: 1. No acute finding. 2. Multiple chronic infarcts as described. Electronically Signed   By: Jorje Guild M.D.   On: 01/10/2022 06:58   CT HEAD CODE STROKE WO CONTRAST  Result Date: 01/09/2022 CLINICAL DATA:  Code stroke. Neuro deficit, acute, stroke suspected. Right-sided facial droop and slurred speech. EXAM: CT HEAD WITHOUT CONTRAST TECHNIQUE: Contiguous axial images were obtained from the base of the skull through the vertex without intravenous contrast. RADIATION DOSE REDUCTION: This exam was performed according to the departmental dose-optimization program which includes automated exposure control, adjustment of the mA and/or kV according to patient size and/or use of iterative reconstruction technique. COMPARISON:  Head CT 10/27/2021 and MRI 08/19/2021 FINDINGS: Brain: There is no evidence of an acute infarct, intracranial hemorrhage, mass, midline shift, or extra-axial fluid collection. A moderate-sized chronic left PCA infarct is again seen with ex vacuo dilatation of the  occipital horn and atrium of the left lateral ventricle. Small chronic right parietal, left frontal, and cerebellar infarcts are again noted as well. Mild hypodensities elsewhere in the cerebral white matter are unchanged and nonspecific but compatible with chronic small vessel ischemic  disease. Vascular: Calcified atherosclerosis at the skull base. No hyperdense vessel. Skull: No acute fracture or suspicious osseous lesion. Sinuses/Orbits: Visualized paranasal sinuses and mastoid air cells are clear. Visualized orbits are unremarkable. Other: None. ASPECTS [facility] Stroke Program Early CT Score) - Ganglionic level infarction (caudate, lentiform nuclei, internal capsule, insula, M1-M3 cortex): 7 - Supraganglionic infarction (M4-M6 cortex): 3 Total score (0-10 with 10 being normal): 10 IMPRESSION: 1. No evidence of acute intracranial abnormality. ASPECTS of 10. 2. Chronic infarcts as above. These results were called by telephone at the time of interpretation on 01/09/2022 at 6:04 pm to Dr. Carrie Mew , who verbally acknowledged these results. Electronically Signed   By: Logan Bores M.D.   On: 01/09/2022 18:04    Microbiology: Results for orders placed or performed during the hospital encounter of 12/03/21  SARS Coronavirus 2 by RT PCR (hospital order, performed in [facility] hospital lab) *cepheid single result test* Anterior Nasal Swab     Status: None   Collection Time: 12/03/21  3:02 AM   Specimen: Anterior Nasal Swab  Result Value Ref Range Status   SARS Coronavirus 2 by RT PCR NEGATIVE NEGATIVE Final    Comment: (NOTE) SARS-CoV-2 target nucleic acids are NOT DETECTED.  The SARS-CoV-2 RNA is generally detectable in upper and lower respiratory specimens during the acute phase of infection. The lowest concentration of SARS-CoV-2 viral copies this assay can detect is 250 copies / mL. A negative result does not preclude SARS-CoV-2 infection and should not be used as the sole basis for treatment or other patient management decisions.  A negative result may occur with improper specimen collection / handling, submission of specimen other than nasopharyngeal swab, presence of viral mutation(s) within the areas targeted by this assay, and inadequate number of viral copies (<250  copies / mL). A negative result must be combined with clinical observations, patient history, and epidemiological information.  Fact Sheet for Patients:   https://www.patel.info/  Fact Sheet for Healthcare Providers: https://hall.com/  This test is not yet approved or  cleared by the [place] FDA and has been authorized for detection and/or diagnosis of SARS-CoV-2 by FDA under an Emergency Use Authorization (EUA).  This EUA will remain in effect (meaning this test can be used) for the duration of the COVID-19 declaration under Section 564(b)(1) of the Act, 21 U.S.C. section 360bbb-3(b)(1), unless the authorization is terminated or revoked sooner.  Performed at [facility], [address] [place], [place] [address]     Labs: CBC: Recent Labs  Lab 01/09/22 1742  WBC 7.2  NEUTROABS 3.6  HGB 11.7*  HCT 35.7*  MCV 100.0  PLT 299   Basic Metabolic Panel: Recent Labs  Lab 01/09/22 1742  NA 138  K 3.7  CL 107  CO2 23  GLUCOSE 97  BUN 18  CREATININE 1.69*  CALCIUM 8.9   Liver Function Tests: Recent Labs  Lab 01/09/22 1742  AST 14*  ALT 9  ALKPHOS 113  BILITOT 0.5  PROT 6.9  ALBUMIN 3.6   CBG: Recent Labs  Lab 01/09/22 1742  GLUCAP 81    Discharge time spent: greater than 30 minutes.  Signed: Loletha Grayer, MD [facility] 01/10/2022

## 2022-01-10 NOTE — Evaluation (Addendum)
Speech Language Pathology Evaluation Patient Details Name: Gabrielle Schultz MRN: [id number] DOB: July 03, 1970 Today's Date: 01/10/2022 Time: 1287-8676 SLP Time Calculation (min) (ACUTE ONLY): 12 min  Problem List:  Patient Active Problem List   Diagnosis Date Noted   Sinusitis 12/03/2021   Urge and stress incontinence 08/27/2021   Vaginal spotting 08/27/2021   CVA (cerebral vascular accident) ([place]) 08/19/2021   COPD (chronic obstructive pulmonary disease) ([place]) 08/19/2021   Elevated troponin 08/19/2021   Cough 08/18/2021   Neck pain 05/21/2021   TIA (transient ischemic attack) 07/22/2020   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[place]) 12/03/2019   Cryptogenic stroke (HCC)    Hyperlipidemia LDL goal <70    AKI (acute kidney injury) ([place])    Obesity    MI (myocardial infarction) ([place])    Migraines    Stroke (cerebrum) ([place]) 11/30/2019   Benign essential hypertension 12/23/2018   Edema of lower extremity 12/23/2018   Recurrent urinary tract infection 12/23/2018   Edema of both feet 09/23/2018   Urine frequency 10/06/2017   SIRS (systemic inflammatory response syndrome) ([place]) 01/18/2017   Abdominal pain 01/18/2017   Aortic regurgitation 09/08/2016   Chest pain 08/28/2016   Hypertensive emergency 07/31/2016   AMA (advanced maternal age) multigravida 35+, third trimester 72/09/4707   Asthma complicating pregnancy, antepartum 07/30/2016   Chronic hypertension affecting pregnancy 07/30/2016   H/O myocardial ischemia 07/30/2016   H/O: C-section 07/30/2016   H/O: stroke 07/30/2016   IUGR (intrauterine growth restriction) affecting care of mother, second trimester, fetus 1 07/30/2016   Supervision of high risk pregnancy in third trimester 07/30/2016   Edema of both feet 04/22/2016   Abnormal TSH 04/22/2016   Abdominal pain, right upper quadrant 12/02/2015   Dermatitis 12/02/2015   Urinary tract infection 10/28/2015   Low HDL (under 40) 06/26/2015   Allergic rhinitis 06/26/2015   Smoker  06/26/2015   Cerebral infarct ([place]) 11/21/2013   Chronic kidney disease 11/21/2013   Hypertension 08/12/2011   LVH (left ventricular hypertrophy) 08/12/2011   Chronic renal insufficiency 08/12/2011   Past Medical History:  Past Medical History:  Diagnosis Date   Acid reflux    Allergic rhinitis 06/26/2015   Allergy    Asthma    Carpal tunnel syndrome    right hand   Chronic kidney disease    Colitis    CVA (cerebral infarction)    2009, 2015   Gout    History of syncope    Hypertension 2008   Low HDL (under 40) 06/26/2015   MI (myocardial infarction) ([place])    2015 - patient wasnt told by cardiologist that she had MI but has had cardiac cath in past   Migraines    Poor circulation    Renal insufficiency    Stroke [facility])    Syncope and collapse    Past Surgical History:  Past Surgical History:  Procedure Laterality Date   BUBBLE STUDY  12/03/2019   Procedure: BUBBLE STUDY;  Surgeon: Geralynn Rile, MD;  Location: [place];  Service: Cardiovascular;;   CARDIAC CATHETERIZATION     CESAREAN SECTION  1994   Dr. Ammie Dalton   COLONOSCOPY WITH PROPOFOL N/A 06/26/2014   Procedure: COLONOSCOPY WITH PROPOFOL;  Surgeon: Josefine Class, MD;  Location: [facility] ENDOSCOPY;  Service: Endoscopy;  Laterality: N/A;   DILATION AND CURETTAGE OF UTERUS  x2 for incomplete SABs   TEE WITHOUT CARDIOVERSION N/A 12/03/2019   Procedure: TRANSESOPHAGEAL ECHOCARDIOGRAM (TEE);  Surgeon: Geralynn Rile, MD;  Location: [place];  Service: Cardiovascular;  Laterality: N/A;   HPI:  Per H&P "Gabrielle Schultz is a 52 y.o. female with medical history significant of essential hypertension, hyperlipidemia, migraines, chronic kidney disease stage III: GERD, MI, gout, who presented to the ER with a complaint of code stroke.  Patient came in with dysarthria among other things.  Patient complained of right facial droop and slurred speech.  NIHSS scale of 3.  Although patient came within the window  for CVA, teleneurology recommended no tPA.  Recommended monitoring.  Symptoms have not resolved.  Patient however has multiple CVA's in the past and as a result will be admitted for workup of CVA." MRI brain 01/10/21 "1. No acute finding.  2. Multiple chronic infarcts as described."   Assessment / Plan / Recommendation Clinical Impression  Pt seen for cognitive-linguistic evaluation. Pt alert and cooperative.  Upright in hospital bed in hallway of ED. Cleared with RN.   Pt assessed via informal means. Pt demonstrated intact basic functional cognitive-linguistic ability. Suspect pt is at or near baseline.  Pt with a flat affect which has been documented on previous admission. Mildly hoarse vocal quality, baseline per pt. Suspect vocal quality secondary to hx of smoking. Speech is fluent, appropriate, and without s/sx anomia or dysarthria. Pt A&Ox4, followed 2-step complex commands, and verbalized safe solutions to routine problems. Pt reports speech deficits have resolved.   SLP to sign off as pt has no acute SLP needs at this time. Pt and RN made aware of results, recommendations, and SLP POC. Pt verbalized understanding/agreement.    SLP Assessment  SLP Recommendation/Assessment: Patient does not need any further Speech [place] [facility] SLP Visit Diagnosis: Dysarthria and anarthria (R47.1)    Recommendations for follow up therapy are one component of a multi-disciplinary discharge planning process, led by the attending physician.  Recommendations may be updated based on patient status, additional functional criteria and insurance authorization.    Follow Up Recommendations  No SLP follow up    Assistance Recommended at Discharge  PRN  Functional Status Assessment Patient has not had a recent decline in their functional status        SLP Evaluation Cognition  Overall Cognitive Status: Within Functional Limits for tasks assessed Arousal/Alertness: Awake/alert Orientation Level:  Oriented X4 Attention:  (WFL for tasks assessed) Memory: Appears intact Awareness: Appears intact Problem Solving: Appears intact (verbal) Safety/Judgment: Appears intact       Comprehension  Auditory Comprehension Overall Auditory Comprehension: Appears within functional limits for tasks assessed Yes/No Questions: Within Functional Limits Commands: Within Functional Limits Conversation: Complex    Expression Expression Primary Mode of Expression: Verbal Verbal Expression Overall Verbal Expression: Appears within functional limits for tasks assessed Initiation: No impairment Automatic Speech: Social Response (WFL) Level of Generative/Spontaneous Verbalization: Conversation Repetition: No impairment Naming: No impairment (confrontation and divergent) Pragmatics:  (flat affect) Written Expression Dominant Hand: Right Written Expression: Not tested   Oral / Motor  Oral Motor/Sensory Function Overall Oral Motor/Sensory Function: Within functional limits Motor Speech Overall Motor Speech: Appears within functional limits for tasks assessed Respiration: Within functional limits Phonation: Normal (mildly hoarse; c/w hx of smoking) Resonance: Within functional limits Articulation: Within functional limitis Intelligibility: Intelligible Motor Planning: Witnin functional limits Motor Speech Errors: Not applicable           Cherrie Gauze, M.S., [facility] [phone] ([place])   Quintella Baton 01/10/2022, 10:02 AM

## 2022-01-10 NOTE — Progress Notes (Signed)
Anticoagulation monitoring(Lovenox):  52 yo  female ordered Lovenox 40 mg Q24h    Filed Weights   01/10/22 0455  Weight: 73 kg (161 lb)   BMI 30.4   Lab Results  Component Value Date   CREATININE 1.69 (H) 01/09/2022   CREATININE 1.66 (H) 12/02/2021   CREATININE 1.62 (H) 11/19/2021   Estimated Creatinine Clearance: 36.8 mL/min (A) (by C-G formula based on SCr of 1.69 mg/dL (H)). Hemoglobin & Hematocrit     Component Value Date/Time   HGB 11.7 (L) 01/09/2022 1742   HGB 12.3 05/21/2021 1932   HCT 35.7 (L) 01/09/2022 1742   HCT 35.6 05/21/2021 1932     Per Protocol for Patient with estCrcl > 30 ml/min and BMI > 30, will transition to Lovenox 37.5 mg Q24h.

## 2022-01-10 NOTE — Assessment & Plan Note (Signed)
With CT and MRI being negative, neurology believes that this is recrudescing prior CVA symptoms.  Neurology recommends aspirin and Plavix and statin indefinitely.

## 2022-01-10 NOTE — Evaluation (Addendum)
Clinical/Bedside Swallow Evaluation Patient Details  Name: Gabrielle Schultz MRN: [id number] Date of Birth: 1970-03-24  Today's Date: 01/10/2022 Time: SLP Start Time (ACUTE ONLY): 2458 SLP Stop Time (ACUTE ONLY): 0848 SLP Time Calculation (min) (ACUTE ONLY): 13 min  Past Medical History:  Past Medical History:  Diagnosis Date   Acid reflux    Allergic rhinitis 06/26/2015   Allergy    Asthma    Carpal tunnel syndrome    right hand   Chronic kidney disease    Colitis    CVA (cerebral infarction)    2009, 2015   Gout    History of syncope    Hypertension 2008   Low HDL (under 40) 06/26/2015   MI (myocardial infarction) ([place])    2015 - patient wasnt told by cardiologist that she had MI but has had cardiac cath in past   Migraines    Poor circulation    Renal insufficiency    Stroke [facility])    Syncope and collapse    Past Surgical History:  Past Surgical History:  Procedure Laterality Date   BUBBLE STUDY  12/03/2019   Procedure: BUBBLE STUDY;  Surgeon: Geralynn Rile, MD;  Location: [place];  Service: Cardiovascular;;   CARDIAC CATHETERIZATION     CESAREAN SECTION  1994   Dr. Ammie Dalton   COLONOSCOPY WITH PROPOFOL N/A 06/26/2014   Procedure: COLONOSCOPY WITH PROPOFOL;  Surgeon: Josefine Class, MD;  Location: [facility] ENDOSCOPY;  Service: Endoscopy;  Laterality: N/A;   DILATION AND CURETTAGE OF UTERUS  x2 for incomplete SABs   TEE WITHOUT CARDIOVERSION N/A 12/03/2019   Procedure: TRANSESOPHAGEAL ECHOCARDIOGRAM (TEE);  Surgeon: Geralynn Rile, MD;  Location: [place];  Service: Cardiovascular;  Laterality: N/A;   HPI:  Per H&P "Gabrielle Schultz is a 52 y.o. female with medical history significant of essential hypertension, hyperlipidemia, migraines, chronic kidney disease stage III: GERD, MI, gout, who presented to the ER with a complaint of code stroke.  Patient came in with dysarthria among other things.  Patient complained of right facial droop and slurred  speech.  NIHSS scale of 3.  Although patient came within the window for CVA, teleneurology recommended no tPA.  Recommended monitoring.  Symptoms have not resolved.  Patient however has multiple CVA's in the past and as a result will be admitted for workup of CVA." MRI brain 01/10/21 "1. No acute finding.  2. Multiple chronic infarcts as described."    Assessment / Plan / Recommendation  Clinical Impression  Pt seen for clinical swallowing evaluation. Pt seen for cognitive-linguistic evaluation. Pt alert and cooperative.  Upright in hospital bed in hallway of ED. Cleared with RN.    Oral motor examination completed and unremarkable with exception of mildly hoarse vocal quality which is baseline per pt. Suspect vocal quality secondary to hx of smoking.  Pt given trials of water, applesauce, and graham cracker. Pt demonstrated an intact oral swallow. Pharyngeal swallow appeared [facility] per clinical assessment. No overt or subtle s/sx pharyngeal dysphagia. Seemingly timely swallow initiation and seemingly adequate laryngeal elevation to palpation. No change to vocal quality across trials.   Recommend initiation of a regular diet with thin liquids with standard aspiration precautions.   SLP to sign off for swallowing and proceed with cognitive-linguistic evaluation.   Pt and RN made aware of results, recommendations, and SLP POC. Pt verbalized understanding/agreement.   SLP Visit Diagnosis: Dysphagia, unspecified (R13.10)    Aspiration Risk  No limitations    Diet Recommendation  Regular;Thin liquid   Medication Administration: Whole meds with liquid Supervision: Patient able to self feed Postural Changes: Seated upright at 90 degrees    Other  Recommendations Oral Care Recommendations: Oral care BID;Patient independent with oral care    Recommendations for follow up therapy are one component of a multi-disciplinary discharge planning process, led by the attending physician.  Recommendations may be  updated based on patient status, additional functional criteria and insurance authorization.  Follow up Recommendations No SLP follow up      Assistance Recommended at Discharge PRN  Functional Status Assessment Patient has not had a recent decline in their functional status         Prognosis Prognosis for Safe Diet Advancement: Good      Swallow Study   General Date of Onset: 01/09/22 HPI: Per H&P "Gabrielle Schultz is a 52 y.o. female with medical history significant of essential hypertension, hyperlipidemia, migraines, chronic kidney disease stage III: GERD, MI, gout, who presented to the ER with a complaint of code stroke.  Patient came in with dysarthria among other things.  Patient complained of right facial droop and slurred speech.  NIHSS scale of 3.  Although patient came within the window for CVA, teleneurology recommended no tPA.  Recommended monitoring.  Symptoms have not resolved.  Patient however has multiple CVA's in the past and as a result will be admitted for workup of CVA." MRI brain 01/10/21 "1. No acute finding.  2. Multiple chronic infarcts as described." Type of Study: Bedside Swallow Evaluation Previous Swallow Assessment: n/a Diet Prior to this Study: NPO Temperature Spikes Noted: No Respiratory Status: Room air Behavior/Cognition: Alert;Cooperative Oral Cavity Assessment: Within Functional Limits Oral Cavity - Dentition: Adequate natural dentition Vision: Functional for self-feeding Self-Feeding Abilities: Able to feed self Patient Positioning: Upright in bed Baseline Vocal Quality: Normal (mild hoarse; baseline per pt) Volitional Cough: Strong Volitional Swallow: Able to elicit    Oral/Motor/Sensory Function Overall Oral Motor/Sensory Function: Within functional limits   Ice Chips Ice chips: Not tested   Thin Liquid Thin Liquid: Within functional limits Presentation: Straw Other Comments: ~4 oz    Nectar Thick Nectar Thick Liquid: Not tested   Honey Thick  Honey Thick Liquid: Not tested   Puree Puree: Within functional limits Presentation: Spoon Other Comments: ~4 oz   Solid     Solid: Within functional limits Presentation: Self Fed Other Comments: x1 graham cracker; presented in halves     Gabrielle Schultz, M.S., [facility] [phone] ([place])   Gabrielle Schultz 01/10/2022,10:12 AM

## 2022-01-10 NOTE — ED Notes (Signed)
Pt given ativan to help with claustrophobia in MRI.

## 2022-01-10 NOTE — Progress Notes (Signed)
  Echocardiogram 2D Echocardiogram has been performed.  Gabrielle Schultz 01/10/2022, 11:07 AM

## 2022-01-10 NOTE — Assessment & Plan Note (Signed)
CKD stage IIIa.  Creatinine 1.69

## 2022-01-10 NOTE — Assessment & Plan Note (Signed)
Improved

## 2022-01-10 NOTE — Assessment & Plan Note (Signed)
Continue clonidine, Norvasc and can restart Lasix as outpatient.

## 2022-01-10 NOTE — Assessment & Plan Note (Signed)
Follow-up with Dr. [name] cardiology

## 2022-01-10 NOTE — Assessment & Plan Note (Signed)
LDL 63.  Continue high-dose Lipitor

## 2022-01-10 NOTE — ED Notes (Signed)
Patient transported to MRI 

## 2022-01-10 NOTE — Assessment & Plan Note (Signed)
Refilled inhalers 

## 2022-01-11 ENCOUNTER — Other Ambulatory Visit: Payer: Self-pay

## 2022-01-11 LAB — HEMOGLOBIN A1C
Hgb A1c MFr Bld: 5.9 % — ABNORMAL HIGH (ref 4.8–5.6)
Mean Plasma Glucose: 123 mg/dL

## 2022-01-11 LAB — URINE CULTURE: Culture: <10000 — AB

## 2022-01-21 ENCOUNTER — Ambulatory Visit: Payer: Medicaid Other

## 2022-01-25 DIAGNOSIS — R069 Unspecified abnormalities of breathing: Secondary | ICD-10-CM | POA: Diagnosis not present

## 2022-01-25 DIAGNOSIS — R000 Tachycardia, unspecified: Secondary | ICD-10-CM | POA: Diagnosis not present

## 2022-01-25 DIAGNOSIS — I10 Essential (primary) hypertension: Secondary | ICD-10-CM | POA: Diagnosis not present

## 2022-02-05 ENCOUNTER — Encounter: Payer: Self-pay | Admitting: Medical

## 2022-02-05 ENCOUNTER — Other Ambulatory Visit
Admission: RE | Admit: 2022-02-05 | Discharge: 2022-02-05 | Disposition: A | Discharge status: Home / Self Care | Payer: Medicaid Other | Source: Ambulatory Visit | Attending: Medical | Admitting: Medical

## 2022-02-05 ENCOUNTER — Ambulatory Visit: Payer: Medicaid Other | Attending: Medical | Admitting: Medical

## 2022-02-05 VITALS — BP 91/48 | HR 65 | Ht 61.0 in | Wt 156.2 lb

## 2022-02-05 DIAGNOSIS — I639 Cerebral infarction, unspecified: Secondary | ICD-10-CM

## 2022-02-05 DIAGNOSIS — E782 Mixed hyperlipidemia: Secondary | ICD-10-CM

## 2022-02-05 DIAGNOSIS — I10 Essential (primary) hypertension: Secondary | ICD-10-CM | POA: Insufficient documentation

## 2022-02-05 DIAGNOSIS — I350 Nonrheumatic aortic (valve) stenosis: Secondary | ICD-10-CM

## 2022-02-05 LAB — CBC
HCT: 30.9 % — ABNORMAL LOW (ref 36.0–46.0)
Hemoglobin: 10.5 g/dL — ABNORMAL LOW (ref 12.0–15.0)
MCH: 33.1 pg (ref 26.0–34.0)
MCHC: 34 g/dL (ref 30.0–36.0)
MCV: 97.5 fL (ref 80.0–100.0)
Platelets: 272 10*3/uL (ref 150–400)
RBC: 3.17 MIL/uL — ABNORMAL LOW (ref 3.87–5.11)
RDW: 15.9 % — ABNORMAL HIGH (ref 11.5–15.5)
WBC: 6 10*3/uL (ref 4.0–10.5)
nRBC: 0 % (ref 0.0–0.2)

## 2022-02-05 MED ORDER — LOSARTAN POTASSIUM 50 MG PO TABS: 50.0000 mg | ORAL_TABLET | Freq: Every day | ORAL | 0 refills | Status: DC

## 2022-02-05 MED ORDER — CLOPIDOGREL BISULFATE 75 MG PO TABS: 75.0000 mg | ORAL_TABLET | Freq: Every day | ORAL | 0 refills | Status: DC

## 2022-02-05 MED ORDER — ATORVASTATIN CALCIUM 80 MG PO TABS: 80.0000 mg | ORAL_TABLET | Freq: Every day | ORAL | 0 refills | Status: DC

## 2022-02-05 MED ORDER — FUROSEMIDE 20 MG PO TABS: 20.0000 mg | ORAL_TABLET | Freq: Every day | ORAL | 0 refills | Status: DC | PRN

## 2022-02-05 MED ORDER — AMLODIPINE BESYLATE 2.5 MG PO TABS: 2.5000 mg | ORAL_TABLET | Freq: Every day | ORAL | 0 refills | Status: DC

## 2022-02-05 MED ORDER — CLONIDINE HCL 0.3 MG PO TABS: 0.3000 mg | ORAL_TABLET | Freq: Three times a day (TID) | ORAL | 0 refills | Status: DC

## 2022-02-05 NOTE — Progress Notes (Unsigned)
Cardiology Office Note:    Date:  02/08/2022   ID:  Gabrielle Schultz, DOB Feb 22, 1970, MRN [id number]  PCP:  Ranae Plumber, [facility] Cardiologist:  Ida Rogue, MD  [facility] Electrophysiologist:  None   Referring MD: Ranae Plumber, PA   Chief Complaint: hospital follow-up  History of Present Illness:    Gabrielle Schultz is a 52 y.o. female with a hx of HTN, recurrent strokes, CKD, HTN, PVD, tobacco use who is being seen for hospital follow-up.   Patient was seen in 2013 with nausea, right-sided facial tingling and numbness found to have severe hypertension.  Exercise stress test in 2017 was normal with no evidence of ischemia, mildly impaired exercise capacity.  Echo 2018 showed normal LVEF, severe AI, moderate MR, moderate TR.   Prior echocardiogram showed moderate aortic valve regurgitation.   Echo TEE in November 2021 for stroke showed type III diastolic dysfunction, moderate MR, no mobile elements, possible endocarditis versus healed vegetations, moderate aortic regurgitation, normal RV function, trivial MR, negative bubble study.   Patient has a history of multiple strokes. Patient was admitted for stroke in August 2023.  Echo 08/20/21 showed LVEF 60-65%, no WMA, mild LVH, G1DD, mildly dilated LA, moderate AI. Heart monitor at discharge showed NSR, 23 runs of NSVT longest 20.8 seconds, 10 runs of SVT, rare ectopy, no triggered events.  No A-fib noted.  The patient was recently admitted for chest pain that lasted about 15 minutes. BP was severely elevated. HS troponin went up to 125. A Myoview Lexiscan was ordered. This showed no significant ischemia, normal wall motion, EF 50%, trace aortic atherosclerosis, no significant coronary calcification, low risk scan.   Today, the patient reports she is under a lot of stress at home right now. She waking up in the middle of the night sweating. She is not having chest pain, shortness of breath, lower leg edema, orthopnea or pnd.  She had an anxiety attack last week. BP is soft today.  Past Medical History:  Diagnosis Date   Acid reflux    Allergic rhinitis 06/26/2015   Allergy    Asthma    Carpal tunnel syndrome    right hand   Chronic kidney disease    Colitis    CVA (cerebral infarction)    2009, 2015   Gout    History of syncope    Hypertension 2008   Low HDL (under 40) 06/26/2015   MI (myocardial infarction) ([place])    2015 - patient wasnt told by cardiologist that she had MI but has had cardiac cath in past   Migraines    Poor circulation    Renal insufficiency    Stroke [facility])    Syncope and collapse     Past Surgical History:  Procedure Laterality Date   BUBBLE STUDY  12/03/2019   Procedure: BUBBLE STUDY;  Surgeon: Geralynn Rile, MD;  Location: [place];  Service: Cardiovascular;;   CARDIAC CATHETERIZATION     CESAREAN SECTION  1994   Dr. Ammie Dalton   COLONOSCOPY WITH PROPOFOL N/A 06/26/2014   Procedure: COLONOSCOPY WITH PROPOFOL;  Surgeon: Josefine Class, MD;  Location: [facility] ENDOSCOPY;  Service: Endoscopy;  Laterality: N/A;   DILATION AND CURETTAGE OF UTERUS  x2 for incomplete SABs   TEE WITHOUT CARDIOVERSION N/A 12/03/2019   Procedure: TRANSESOPHAGEAL ECHOCARDIOGRAM (TEE);  Surgeon: Geralynn Rile, MD;  Location: [facility] ENDOSCOPY;  Service: Cardiovascular;  Laterality: N/A;    Current Medications: Current Meds  Medication  Sig   acetaminophen (TYLENOL) 500 MG tablet Take 500 mg by mouth every 6 (six) hours as needed for fever or mild pain.   albuterol (PROVENTIL HFA) 108 (90 Base) MCG/ACT inhaler Inhale 2 puffs into the lungs once every 4 (four) hours as needed for wheezing (cough).   allopurinol (ZYLOPRIM) 100 MG tablet TAKE ONE TABLET BY MOUTH ONCE DAILY. (Patient taking differently: Take 100 mg by mouth daily.)   amLODipine (NORVASC) 2.5 MG tablet Take 1 tablet (2.5 mg total) by mouth daily.   aspirin EC 81 MG tablet Take 1 tablet (81 mg total) by mouth once daily.  Swallow whole.   cetirizine (ZYRTEC) 10 MG tablet TAKE ONE TABLET BY MOUTH ONCE DAILY.   fluticasone (FLONASE) 50 MCG/ACT nasal spray USE 1 SPRAY INTO BOTH NOSTRILS 2 TIMES A DAY   fluticasone-salmeterol (WIXELA INHUB) 250-50 MCG/ACT AEPB Inhale one puff into the lungs in the morning and at bedtime   omeprazole (PRILOSEC) 20 MG capsule Take 20 mg by mouth daily.   pantoprazole (PROTONIX) 40 MG tablet Take 1 tablet (40 mg total) by mouth daily.   potassium chloride (KLOR-CON M) 10 MEQ tablet Take 1 tablet (10 mEq total) by mouth as needed (potassium). To take whenever you take lasix   promethazine (PHENERGAN) 25 MG tablet Take 25 mg by mouth as needed for nausea or vomiting.   [DISCONTINUED] amLODipine (NORVASC) 10 MG tablet Take 1 tablet (10 mg total) by mouth daily.   [DISCONTINUED] atorvastatin (LIPITOR) 80 MG tablet Take 1 tablet (80 mg total) by mouth daily.   [DISCONTINUED] cloNIDine (CATAPRES) 0.3 MG tablet Take 1 tablet by mouth 3 times daily (morning, noon, and evening).   [DISCONTINUED] clopidogrel (PLAVIX) 75 MG tablet Take 1 tablet (75 mg total) by mouth once daily.   [DISCONTINUED] furosemide (LASIX) 20 MG tablet Take 1 tablet (20 mg total) by mouth daily as needed for fluid or edema.   [DISCONTINUED] losartan (COZAAR) 50 MG tablet Take 1 tablet (50 mg total) by mouth daily. (Patient taking differently: Take 25 mg by mouth daily.)     Allergies:   Coconut (cocos nucifera), Amoxicillin, Penicillins, and Singulair [montelukast sodium]   Social History   Socioeconomic History   Marital status: Married    Spouse name: Not on file   Number of children: 2   Years of education: 11th grade   Highest education level: 11th grade  Occupational History   Occupation: unemployed  Tobacco Use   Smoking status: Every Day    Packs/day: 0.50    Years: 33.00    Total pack years: 16.50    Types: Cigarettes    Start date: 03/04/2017   Smokeless tobacco: Never  Vaping Use   Vaping Use: Never  used  Substance and Sexual Activity   Alcohol use: Yes    Comment: rare- drinks an occassional wine cooler   Drug use: No   Sexual activity: Yes    Partners: Male    Birth control/protection: None  Other Topics Concern   Not on file  Social History Narrative   Husband is a Administrator for [employer] and is the sole provider for the family including 2 children (21 yr old and 61 yr old)    Goes to food bank for food. Says "really worried" about water and light bill. Husband, baby, 81 year old, her, and mother all live together.    Patient would like appointment with [facility].       Pt is right handed  Social Determinants of Health   Financial Resource Strain: High Risk (08/02/2017)   Overall Financial Resource Strain (CARDIA)    Difficulty of Paying Living Expenses: Hard  Food Insecurity: Food Insecurity Present (05/21/2021)   Hunger Vital Sign    Worried About Running Out of Food in the Last Year: Sometimes true    Ran Out of Food in the Last Year: Sometimes true  Transportation Needs: No Transportation Needs (05/21/2021)   PRAPARE - Hydrologist (Medical): No    Lack of Transportation (Non-Medical): No  Physical Activity: Insufficiently Active (07/25/2018)   Exercise Vital Sign    Days of Exercise per Week: 2 days    Minutes of Exercise per Session: 60 min  Stress: Stress Concern Present (08/02/2017)   [place]    Feeling of Stress : Very much  Social Connections: Somewhat Isolated (08/02/2017)   Social Connection and Isolation Panel [NHANES]    Frequency of Communication with Friends and Family: More than three times a week    Frequency of Social Gatherings with Friends and Family: Never    Attends Religious Services: Never    Marine scientist or Organizations: No    Attends Music therapist: Never    Marital Status: Married     Family History: The patient's  family history includes COPD in her mother; Coronary artery disease in her brother; Diabetes in her child; Healthy in her child; Heart disease in her brother, father, and mother; Hyperlipidemia in her brother, father, and mother; Hypertension in her brother, father, and sister; Kidney disease in her sister.  ROS:   Please see the history of present illness.     All other systems reviewed and are negative.  EKGs/Labs/Other Studies Reviewed:    The following studies were reviewed today:  Echo 01/2022 1. Left ventricular ejection fraction, by estimation, is 60 to 65%. The  left ventricle has normal function. The left ventricle has no regional  wall motion abnormalities. The left ventricular internal cavity size was  mildly dilated. There is mild  concentric left ventricular hypertrophy. Left ventricular diastolic  parameters are indeterminate. The average left ventricular global  longitudinal strain is -18.8 %. The global longitudinal strain is normal.   2. Right ventricular systolic function is normal. The right ventricular  size is normal. Tricuspid regurgitation signal is inadequate for assessing  PA pressure.   3. The mitral valve is degenerative. Mild to moderate mitral valve  regurgitation. No evidence of mitral stenosis.   4. The aortic valve has an indeterminant number of cusps. There is  moderate calcification of the aortic valve. There is moderate thickening  of the aortic valve. Aortic valve regurgitation is moderate to severe.  Mild to moderate aortic valve stenosis.  Aortic regurgitation PHT measures 381 msec. Aortic valve area, by VTI  measures 1.47 cm. Aortic valve mean gradient measures 16.6 mmHg. Aortic  valve Vmax measures 3.03 m/s.   5. The inferior vena cava is normal in size with greater than 50%  respiratory variability, suggesting right atrial pressure of 3 mmHg.   6. Consider further workup with TEE to assess AI further.   Myoview lexiscan 11/2021  Narrative &  Impression  Pharmacological myocardial perfusion imaging study with no significant  ischemia Normal wall motion, EF estimated at 50%, GI uptake artifact noted Fixed apical defect likely from attenuation artifact No EKG changes concerning for ischemia at peak stress or in recovery.  CT attenuation correction images with trace aortic atherosclerosis, no significant coronary calcification Low risk scan     Signed, Esmond Plants, MD, Ph.D [facility]   EKG:  EKG is not ordered today.    Recent Labs: 08/19/2021: TSH 1.356 12/03/2021: Magnesium 1.9 01/09/2022: ALT 9; BUN 18; Creatinine, Ser 1.69; Potassium 3.7; Sodium 138 02/05/2022: Hemoglobin 10.5; Platelets 272  Recent Lipid Panel    Component Value Date/Time   CHOL 112 01/10/2022 0451   CHOL 188 01/10/2020 1913   CHOL 123 10/20/2012 0226   TRIG 142 01/10/2022 0451   TRIG 85 10/20/2012 0226   HDL 21 (L) 01/10/2022 0451   HDL 34 (L) 01/10/2020 1913   HDL 20 (L) 10/20/2012 0226   CHOLHDL 5.3 01/10/2022 0451   VLDL 28 01/10/2022 0451   VLDL 17 10/20/2012 0226   LDLCALC 63 01/10/2022 0451   LDLCALC 126 (H) 01/10/2020 1913   LDLCALC 86 10/20/2012 0226    Physical Exam:    VS:  BP (!) 91/48 (BP Location: Left Arm, Patient Position: Sitting, Cuff Size: Normal)   Pulse 65   Ht 5\' 1"  (1.549 m)   Wt 156 lb 3.2 oz (70.9 kg)   LMP 08/18/2020 (Approximate) Comment: Tubal ligation  SpO2 99%   BMI 29.51 kg/m     Wt Readings from Last 3 Encounters:  02/05/22 156 lb 3.2 oz (70.9 kg)  01/10/22 161 lb (73 kg)  12/02/21 161 lb (73 kg)     GEN:  Well nourished, well developed in no acute distress HEENT: Normal NECK: No JVD; No carotid bruits LYMPHATICS: No lymphadenopathy CARDIAC: RRR, no murmurs, rubs, gallops RESPIRATORY:  Clear to auscultation without rales, wheezing or rhonchi  ABDOMEN: Soft, non-tender, non-distended MUSCULOSKELETAL:  No edema; No deformity  SKIN: Warm and dry NEUROLOGIC:  Alert and oriented x 3 PSYCHIATRIC:   Normal affect   ASSESSMENT:    1. Primary hypertension   2. Essential (primary) hypertension   3. Cerebrovascular accident (CVA), unspecified mechanism ([place])   4. Aortic valve stenosis, etiology of cardiac valve disease unspecified   5. Mixed hyperlipidemia    PLAN:    In order of problems listed above:  HTN BP is soft today, 91/48. I will decrease amlodipine to 2.5mg  daily. Continue Losartan 25mg  daily and clonidine 0.3mg TID.   H/o strokes Continue Aspirin and Plavix. Plan for ILR pending insurance.   Bloody nose The patient reports bloody nose, I will check a CBC.  Aortic valve disease Most recent echo showed LVEF 60-65%, mild to mod MR and moderate to severe AI and mild to moderate stenosis. I will refer the patient to the structural heart team, she may need a TEE.  HLD LDL 63. Continue Lipitor 80mg  daily.   Disposition: Follow up in 3 month(s) with MD/APP    Signed, Asiah Browder Ninfa Meeker, PA-C  02/08/2022 9:43 AM    [facility]

## 2022-02-05 NOTE — Patient Instructions (Addendum)
Medication Instructions:  DECREASE amlodipine to 2.5 mg by mouth once daily.  *If you need a refill on your cardiac medications before your next appointment, please call your pharmacy*   Lab Work: CBC - [facility] to the [facility]. You will check in at the front desk to the right as you walk into the atrium. Valet Parking is offered if needed. - No appointment needed. You may go any day between 7 am and 6 pm.  If you have labs (blood work) drawn today and your tests are completely normal, you will receive your results only by: [place] (if you have MyChart) OR A paper copy in the mail If you have any lab test that is abnormal or we need to change your treatment, we will call you to review the results.   Testing/Procedures: No testing ordered  Follow-Up: At [facility], you and your health needs are our priority.  As part of our continuing mission to provide you with exceptional heart care, we have created designated Provider Care Teams.  These Care Teams include your primary Cardiologist (physician) and Advanced Practice Providers (APPs -  Physician Assistants and Nurse Practitioners) who all work together to provide you with the care you need, when you need it.  We recommend signing up for the patient portal called "MyChart".  Sign up information is provided on this After Visit Summary.  MyChart is used to connect with patients for Virtual Visits (Telemedicine).  Patients are able to view lab/test results, encounter notes, upcoming appointments, etc.  Non-urgent messages can be sent to your provider as well.   To learn more about what you can do with MyChart, go to [email or web address].    Your next appointment:   3 month(s)  Provider:   You may see Ida Rogue, MD or one of the following Advanced Practice Providers on your designated Care Team:   Murray Hodgkins, NP Christell Faith, PA-C Cadence Kathlen Mody, PA-C Gerrie Nordmann, NP   Other Instructions You are  being referred to the Structural [facility]ve Clinic for management of aortic valve stenosis.

## 2022-02-24 ENCOUNTER — Other Ambulatory Visit: Payer: Self-pay

## 2022-02-28 NOTE — Progress Notes (Unsigned)
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   Continue Zio patch.  If unrevealing, would refer to cardiology for consideration of implantable loop recorder 2.  Secondary stroke prevention as managed by PCP: - ASA '81mg'$  and Plavix '75mg'$  daily - Statin therapy.  LDL goal less than 70 - Normotensive blood pressure - Hgb A1c goal less than 7 2. Smoking cessation 3. Mediterranean  4. Routine exercise 5. Follow up 6 months.   Subjective:  Gabrielle Schultz is a 52 year old right-handed female with HTN, CKD, COPD, tobacco use disorder, ischemic colitis and history of strokes who follows up for stroke. Recent CT/MRI brain and MRA head and neck personally reviewed.   UPDATE: Current medications:  ASA '81mg'$ , Plavix '75mg'$ , clonidine, amlodipine, clonidine, atorvastatin '40mg'$  daily   12 day cardiac monitor in August was negative for a fib.   On 10/27/2021 she developed left arm numbness and heaviness that resolved after 15 minutes, similar to previous stroke symptoms from August.  CT head showed remote infarcts but no acute abnormality.  She had recent change to her blood pressure medication and it was thought that she had recrudescence of symptoms due to hypotension.    On 01/09/2022, she ***.  She was seen at [facility] where CT and follow up MRI of brain revealed no acute stroke.  2D echo revealed EF 60-65% with stable moderate aortic valve regurgitation.  Plan is for implantable loop recorder pending insurance.     HISTORY: She had a stroke at age 50.  She doesn't remember her symptoms.   She had a second stroke in addition to an acute ischemic colitis in 2014.  She does not remember her symptoms but records report right homonymous hemianopsia and right arm and leg weakness and numbness of 2  months duration.   MRI of brain without contrast from 12/29/2012 demonstrated "[R]emote moderate size left occipital/posterior inferior left temporal lobe infarct with small area of involvement of the inferior left parietal lobe.  Associated encephalomalacia and laminar necrosis.  No evidence of acute infarct.  Remote small left frontal lobe infarct.  Mild to moderate nonspecific white matter type changes.Marland KitchenMarland KitchenGlobal atrophy without hydrocephalus."  She reports that no cause for her strokes were ever found.  Following her stroke, she never underwent PT/OT because she had no insurance.  Since the second stroke, she continues to have aching and cramping of her hand down to her elbow with use of her hand.  She also has numbness involving the first 3 digits of her right hand.  She has history of carpal tunnel syndrome diagnosed many years ago, so she tried wearing a brace.  NCV-EMG of right upper extremity on 11/23/2018 was normal.   She was admitted to [facility] in November 2021 for a third stroke presenting with sudden left sided weakness.  Blood pressure was in the 123456 systolic.  CT head was negative for bleed.  She received IV tPA.  MRI of brain showed small acute posterior right frontal lobe infarct.  2D echo showed EF 65-70% with no source of embolus.  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  Blood cultures were negative.  [employer] Virus 2 negative, LDL 97,  and Hgb A1c 5.7.  Hypercoagulable panel showed homocysteine 52 (thought to be secondary to smoking), antithrombin III 121, protein C 109, B2GPI negative, lupus anticoagulant panel negative and cardiolipin negative.  She was discharged on ASA and Plavix '75mg'$  daily for 3 weeks followed by Plavix alone.  Zocor was increased to '40mg'$  daily, however her PCP reduced dose due to her kidney disease.    On 07/21/2020, she  developed sudden onset right sided weakness and aphasia lasting 20 minutes.  She went to the ED where CT and MRI of brain showed chronic cortical infarcts, primarily left occipital lobe, but no acute stroke.  MRA of head limited due to motion but no large vessel occlusion. Carotid ultrasound negative for hemodynamically significant stenosis.  Echocardiogram showed EF 65-70% with negative bubble study.  LDL was 119 and Hgb A1c was 5.9.  She was found to be positive for COVID.  Discharged on ASA and Plavix for 3 weeks followed by Plavix alone.  Simvastatin was changed to atorvastatin '40mg'$ .  Unclear if this was a left hemispheric transient ischemic attack vs recrudescence of previous stroke symptoms in setting of COVID.   Admitted to [facility] for another stroke.  She noted transient bilateral leg weakness ("legs felt like jelly").  She also woke up that morning feeling dizzy.  In the hospital, patient was without neurologic deficits.  MRI of brain showed probable subacute right parietal infarct with cortical laminar necrosis as well as chronic small vessel ischemic changes with remote infarcts in the left occipital lobe, left frontal lobe, and cerebellum.  MRA head and neck was normal.  2D echo showed LVEF 60-65% with aortic valve sclerosis and moderate regurg and negative bubble study.  Labs included LDL 78, negative Factor V leiden gene muation and negative HIV.  She had an elevated troponin determined to be secondary to stroke. Discharged on Zio patch.  Currently taking ASA and Plavix    PAST MEDICAL HISTORY: Past Medical History:  Diagnosis Date   Acid reflux    Allergic rhinitis 06/26/2015   Allergy    Asthma    Carpal tunnel syndrome    right hand   Chronic kidney disease    Colitis    CVA (cerebral infarction)    2009, 2015   Gout    History of syncope    Hypertension 2008   Low HDL (under 40) 06/26/2015   MI (myocardial infarction) ([place])    2015 - patient wasnt told  by cardiologist that she had MI but has had cardiac cath in past   Migraines    Poor circulation    Renal insufficiency    Stroke [facility])    Syncope and collapse     MEDICATIONS: Current Outpatient Medications on File Prior to Visit  Medication Sig Dispense Refill   acetaminophen (TYLENOL) 500 MG tablet Take 500 mg by mouth every 6 (six) hours as needed for fever or mild pain.     albuterol (PROVENTIL HFA) 108 (90 Base) MCG/ACT inhaler Inhale 2 puffs into the lungs once every 4 (four) hours as needed for wheezing (cough). 18 g 0   allopurinol (ZYLOPRIM) 100 MG tablet TAKE ONE TABLET BY MOUTH ONCE DAILY. (Patient taking differently: Take 100 mg by mouth daily.) 30 tablet 0   amLODipine (NORVASC) 2.5 MG tablet Take 1 tablet (2.5 mg total) by mouth daily. 90 tablet 0   aspirin EC 81 MG tablet Take 1 tablet (81 mg total) by mouth once daily. Swallow whole. [place]  tablet 0   atorvastatin (LIPITOR) 80 MG tablet Take 1 tablet (80 mg total) by mouth daily. 90 tablet 0   Blood Pressure Monitor MISC Use as needed to check blood pressure 1 each 0   cetirizine (ZYRTEC) 10 MG tablet TAKE ONE TABLET BY MOUTH ONCE DAILY. 30 tablet 0   cloNIDine (CATAPRES) 0.3 MG tablet Take 1 tablet by mouth 3 times daily (morning, noon, and evening). 270 tablet 0   clopidogrel (PLAVIX) 75 MG tablet Take 1 tablet (75 mg total) by mouth once daily. 90 tablet 0   fluticasone (FLONASE) 50 MCG/ACT nasal spray USE 1 SPRAY INTO BOTH NOSTRILS 2 TIMES A DAY 16 g 0   fluticasone-salmeterol (WIXELA INHUB) 250-50 MCG/ACT AEPB Inhale one puff into the lungs in the morning and at bedtime 60 each 0   furosemide (LASIX) 20 MG tablet Take 1 tablet (20 mg total) by mouth daily as needed for fluid or edema. 90 tablet 0   losartan (COZAAR) 50 MG tablet Take 1 tablet (50 mg total) by mouth daily. 90 tablet 0   omeprazole (PRILOSEC) 20 MG capsule Take 20 mg by mouth daily.     pantoprazole (PROTONIX) 40 MG tablet Take 1 tablet (40 mg total) by  mouth daily. 30 tablet 0   potassium chloride (KLOR-CON M) 10 MEQ tablet Take 1 tablet (10 mEq total) by mouth as needed (potassium). To take whenever you take lasix 30 tablet 0   promethazine (PHENERGAN) 25 MG tablet Take 25 mg by mouth as needed for nausea or vomiting.     [DISCONTINUED] simvastatin (ZOCOR) 10 MG tablet TAKE ONE TABLET BY MOUTH EVERY DAY (Patient not taking: Reported on 07/22/2020) 90 tablet 0   No current facility-administered medications on file prior to visit.    ALLERGIES: Allergies  Allergen Reactions   Coconut (Cocos Nucifera) Hives and Swelling    Just coconut ("all coconut")   Amoxicillin Other (See Comments)    "makes me sick" "swelled up everywhere" Has patient had a PCN reaction causing immediate rash, facial/tongue/throat swelling, SOB or lightheadedness with hypotension: No Has patient had a PCN reaction causing severe rash involving mucus membranes or skin necrosis: No Has patient had a PCN reaction that required hospitalization: No Has patient had a PCN reaction occurring within the last 10 years: No If all of the above answers are "NO", then may proceed with Cephalosporin use.     Penicillins Other (See Comments)    "makes me sick" "swelled up oil" Has patient had a PCN reaction causing immediate rash, facial/tongue/throat swelling, SOB or lightheadedness with hypotension: No Has patient had a PCN reaction causing severe rash involving mucus membranes or skin necrosis: No Has patient had a PCN reaction that required hospitalization: No Has patient had a PCN reaction occurring within the last 10 years: Yes If all of the above answers are "NO", then may proceed with Cephalosporin use.    Singulair [Montelukast Sodium] Rash    "made heart race"    FAMILY HISTORY: Family History  Problem Relation Age of Onset   Hyperlipidemia Mother    Heart disease Mother        has a defibrillator   COPD Mother    Hypertension Father    Heart disease Father     Hyperlipidemia Father    Hypertension Sister        died in her 84s from heart failure and end stage kidney disease   Kidney disease Sister    Hypertension Brother  Heart disease Brother        has a defibrillator   Coronary artery disease Brother        had CABG   Hyperlipidemia Brother    Healthy Child    Diabetes Child       Objective:  *** General: No acute distress.  Patient appears well-groomed.   Head:  Normocephalic/atraumatic Eyes:  Fundi examined but not visualized Neck: supple, no paraspinal tenderness, full range of motion Heart:  Regular rate and rhythm Neurological Exam: alert and oriented to person, place, and time.  Speech fluent and not dysarthric, language intact.  Right homonymous hemianopsia.  Otherwise, CN II-XII intact. Bulk and tone normal, muscle strength 5/5 throughout.  Sensation to light touch intact.  Deep tendon reflexes 2+ throughout, toes downgoing.  Finger to nose testing intact.  Broad-based cautious gait.  Ambulates with cane.  Romberg with mild sway. ***   Metta Clines, DO  CC: Ranae Plumber, [place]

## 2022-03-01 ENCOUNTER — Other Ambulatory Visit: Payer: Self-pay

## 2022-03-01 ENCOUNTER — Encounter: Payer: Self-pay | Admitting: Neurology

## 2022-03-01 ENCOUNTER — Ambulatory Visit: Payer: Medicaid Other | Attending: Internal Medicine | Admitting: Internal Medicine

## 2022-03-01 ENCOUNTER — Ambulatory Visit (INDEPENDENT_AMBULATORY_CARE_PROVIDER_SITE_OTHER): Payer: Medicaid Other | Admitting: Neurology

## 2022-03-01 ENCOUNTER — Encounter: Payer: Self-pay | Admitting: Internal Medicine

## 2022-03-01 VITALS — BP 136/72 | HR 75 | Ht 61.0 in | Wt 158.6 lb

## 2022-03-01 VITALS — BP 133/71 | HR 68 | Ht 61.0 in | Wt 159.1 lb

## 2022-03-01 DIAGNOSIS — I639 Cerebral infarction, unspecified: Secondary | ICD-10-CM

## 2022-03-01 DIAGNOSIS — E785 Hyperlipidemia, unspecified: Secondary | ICD-10-CM

## 2022-03-01 DIAGNOSIS — H53461 Homonymous bilateral field defects, right side: Secondary | ICD-10-CM | POA: Diagnosis not present

## 2022-03-01 DIAGNOSIS — I350 Nonrheumatic aortic (valve) stenosis: Secondary | ICD-10-CM

## 2022-03-01 DIAGNOSIS — I10 Essential (primary) hypertension: Secondary | ICD-10-CM

## 2022-03-01 DIAGNOSIS — I351 Nonrheumatic aortic (valve) insufficiency: Secondary | ICD-10-CM | POA: Diagnosis not present

## 2022-03-01 DIAGNOSIS — Z720 Tobacco use: Secondary | ICD-10-CM

## 2022-03-01 DIAGNOSIS — N1832 Chronic kidney disease, stage 3b: Secondary | ICD-10-CM

## 2022-03-01 DIAGNOSIS — I340 Nonrheumatic mitral (valve) insufficiency: Secondary | ICD-10-CM

## 2022-03-01 NOTE — H&P (View-Only) (Signed)
Patient ID: Gabrielle Schultz MRN: [id number] DOB/AGE: May 08, 1970 52 y.o.  Primary Care Physician:Markley, Vaughan Basta, [place] Primary Cardiologist: Ida Rogue, MD   FOCUSED CARDIOVASCULAR PROBLEM LIST:   1.  History of multiple strokes; bubble study echocardiogram 2023 was negative 2.  Hypertension 3.  Hyperlipidemia 4.  Asthma 5.  Poly-valvular disease with moderate aortic insufficiency with a pressure half-time of 389, moderate aortic stenosis with mean gradient of 16 mmHg and mild to moderate mitral regurgitation with an ejection fraction of 60 to 65% 6.  Stage III CKD 7.  35 p-y smoking history  HISTORY OF PRESENT ILLNESS: The patient is a 52 y.o. female with the indicated medical history here for recommendations regarding her aortic valvular disease.  Patient was recently admitted to the [facility] regional hospital with dysarthria and found to have suffered from stroke.  With a plan for indefinite dual antiplatelet therapy and statin therapy.  She has had multiple strokes and previous bubble study echo was negative.  She gets short of breath on a regular basis with wheezing.  Is reliably managed by her albuterol.  However she also does have episodes of shortness of breath where she is not wheezing.  She however denies any chest pain.  She did cannot sleep flat due to severe GERD.  She occasionally gets peripheral edema when she is up on her feet a lot.  This is typically better in the morning after she is had a night sleep.  She denies any presyncope, syncope, palpitations, severe bleeding, or signs or symptoms of recurrent stroke.  She does continue to smoke.  She has been smoking for about 35 years.  She has not seen a dentist in a very long time.  Past Medical History:  Diagnosis Date   Acid reflux    Allergic rhinitis 06/26/2015   Allergy    Asthma    Carpal tunnel syndrome    right hand   Chronic kidney disease    Colitis    CVA (cerebral infarction)    2009, 2015   Gout     History of syncope    Hypertension 2008   Low HDL (under 40) 06/26/2015   MI (myocardial infarction) ([place])    2015 - patient wasnt told by cardiologist that she had MI but has had cardiac cath in past   Migraines    Poor circulation    Renal insufficiency    Stroke [facility])    Syncope and collapse     Past Surgical History:  Procedure Laterality Date   BUBBLE STUDY  12/03/2019   Procedure: BUBBLE STUDY;  Surgeon: Geralynn Rile, MD;  Location: [place];  Service: Cardiovascular;;   CARDIAC CATHETERIZATION     CESAREAN SECTION  1994   Dr. Ammie Dalton   COLONOSCOPY WITH PROPOFOL N/A 06/26/2014   Procedure: COLONOSCOPY WITH PROPOFOL;  Surgeon: Josefine Class, MD;  Location: [facility] ENDOSCOPY;  Service: Endoscopy;  Laterality: N/A;   DILATION AND CURETTAGE OF UTERUS  x2 for incomplete SABs   TEE WITHOUT CARDIOVERSION N/A 12/03/2019   Procedure: TRANSESOPHAGEAL ECHOCARDIOGRAM (TEE);  Surgeon: Geralynn Rile, MD;  Location: [facility] ENDOSCOPY;  Service: Cardiovascular;  Laterality: N/A;    Family History  Problem Relation Age of Onset   Hyperlipidemia Mother    Heart disease Mother        has a defibrillator   COPD Mother    Hypertension Father    Heart disease Father    Hyperlipidemia Father    Hypertension Sister  died in her 74s from heart failure and end stage kidney disease   Kidney disease Sister    Hypertension Brother    Heart disease Brother        has a defibrillator   Coronary artery disease Brother        had CABG   Hyperlipidemia Brother    Healthy Child    Diabetes Child     Social History   Socioeconomic History   Marital status: Married    Spouse name: Not on file   Number of children: 2   Years of education: 11th grade   Highest education level: 11th grade  Occupational History   Occupation: unemployed  Tobacco Use   Smoking status: Every Day    Packs/day: 0.50    Years: 33.00    Total pack years: 16.50    Types: Cigarettes    Start  date: 03/04/2017   Smokeless tobacco: Never  Vaping Use   Vaping Use: Never used  Substance and Sexual Activity   Alcohol use: Yes    Comment: rare- drinks an occassional wine cooler   Drug use: No   Sexual activity: Yes    Partners: Male    Birth control/protection: None  Other Topics Concern   Not on file  Social History Narrative   Husband is a Administrator for [employer] and is the sole provider for the family including 2 children (52 yr old and 57 yr old)    Goes to food bank for food. Says "really worried" about water and light bill. Husband, baby, 39 year old, her, and mother all live together.    Patient would like appointment with [facility].       Pt is right handed   Social Determinants of Health   Financial Resource Strain: High Risk (08/02/2017)   Overall Financial Resource Strain (CARDIA)    Difficulty of Paying Living Expenses: Hard  Food Insecurity: Food Insecurity Present (05/21/2021)   Hunger Vital Sign    Worried About Running Out of Food in the Last Year: Sometimes true    Ran Out of Food in the Last Year: Sometimes true  Transportation Needs: No Transportation Needs (05/21/2021)   PRAPARE - Hydrologist (Medical): No    Lack of Transportation (Non-Medical): No  Physical Activity: Insufficiently Active (07/25/2018)   Exercise Vital Sign    Days of Exercise per Week: 2 days    Minutes of Exercise per Session: 60 min  Stress: Stress Concern Present (08/02/2017)   [place]    Feeling of Stress : Very much  Social Connections: Somewhat Isolated (08/02/2017)   Social Connection and Isolation Panel [NHANES]    Frequency of Communication with Friends and Family: More than three times a week    Frequency of Social Gatherings with Friends and Family: Never    Attends Religious Services: Never    Marine scientist or Organizations: No    Attends Archivist  Meetings: Never    Marital Status: Married  Human resources officer Violence: Not At Risk (08/02/2017)   Humiliation, Afraid, Rape, and Kick questionnaire    Fear of Current or Ex-Partner: No    Emotionally Abused: No    Physically Abused: No    Sexually Abused: No     Prior to Admission medications   Medication Sig Start Date End Date Taking? Authorizing Provider  acetaminophen (TYLENOL) 500 MG tablet Take 500 mg by  mouth every 6 (six) hours as needed for fever or mild pain.    [provider]  albuterol (PROVENTIL HFA) 108 (90 Base) MCG/ACT inhaler Inhale 2 puffs into the lungs once every 4 (four) hours as needed for wheezing (cough). 01/10/22   Loletha Grayer, MD  allopurinol (ZYLOPRIM) 100 MG tablet TAKE ONE TABLET BY MOUTH ONCE DAILY. Patient taking differently: Take 100 mg by mouth daily. 01/10/22   Loletha Grayer, MD  amLODipine (NORVASC) 2.5 MG tablet Take 1 tablet (2.5 mg total) by mouth daily. 02/05/22 05/06/22  Furth, Cadence H, PA-C  aspirin EC 81 MG tablet Take 1 tablet (81 mg total) by mouth once daily. Swallow whole. 01/10/22   Loletha Grayer, MD  atorvastatin (LIPITOR) 80 MG tablet Take 1 tablet (80 mg total) by mouth daily. 02/05/22   Furth, Cadence H, PA-C  Blood Pressure Monitor MISC Use as needed to check blood pressure 10/22/21   Iloabachie, Chioma E, NP  cetirizine (ZYRTEC) 10 MG tablet TAKE ONE TABLET BY MOUTH ONCE DAILY. 01/10/22   Loletha Grayer, MD  cloNIDine (CATAPRES) 0.3 MG tablet Take 1 tablet by mouth 3 times daily (morning, noon, and evening). 02/05/22   Furth, Cadence H, PA-C  clopidogrel (PLAVIX) 75 MG tablet Take 1 tablet (75 mg total) by mouth once daily. 02/05/22   Furth, Cadence H, PA-C  fluticasone (FLONASE) 50 MCG/ACT nasal spray USE 1 SPRAY INTO BOTH NOSTRILS 2 TIMES A DAY 01/10/22   Loletha Grayer, MD  fluticasone-salmeterol [facility]) 250-50 MCG/ACT AEPB Inhale one puff into the lungs in the morning and at bedtime 01/10/22   Loletha Grayer, MD   furosemide (LASIX) 20 MG tablet Take 1 tablet (20 mg total) by mouth daily as needed for fluid or edema. 02/05/22   Furth, Cadence H, PA-C  losartan (COZAAR) 50 MG tablet Take 1 tablet (50 mg total) by mouth daily. 02/05/22   Furth, Cadence H, PA-C  omeprazole (PRILOSEC) 20 MG capsule Take 20 mg by mouth daily. 01/10/22   [provider]  pantoprazole (PROTONIX) 40 MG tablet Take 1 tablet (40 mg total) by mouth daily. 01/11/22   Loletha Grayer, MD  potassium chloride (KLOR-CON M) 10 MEQ tablet Take 1 tablet (10 mEq total) by mouth as needed (potassium). To take whenever you take lasix 01/10/22 02/09/22  Loletha Grayer, MD  promethazine (PHENERGAN) 25 MG tablet Take 25 mg by mouth as needed for nausea or vomiting.    [provider]  simvastatin (ZOCOR) 10 MG tablet TAKE ONE TABLET BY MOUTH EVERY DAY Patient not taking: Reported on 07/22/2020 05/22/20 0000000  Copland, Deirdre Evener, PA-C    Allergies  Allergen Reactions   Coconut (Cocos Nucifera) Hives and Swelling    Just coconut ("all coconut")   Amoxicillin Other (See Comments)    "makes me sick" "swelled up everywhere" Has patient had a PCN reaction causing immediate rash, facial/tongue/throat swelling, SOB or lightheadedness with hypotension: No Has patient had a PCN reaction causing severe rash involving mucus membranes or skin necrosis: No Has patient had a PCN reaction that required hospitalization: No Has patient had a PCN reaction occurring within the last 10 years: No If all of the above answers are "NO", then may proceed with Cephalosporin use.     Penicillins Other (See Comments)    "makes me sick" "swelled up oil" Has patient had a PCN reaction causing immediate rash, facial/tongue/throat swelling, SOB or lightheadedness with hypotension: No Has patient had a PCN reaction causing severe rash involving  mucus membranes or skin necrosis: No Has patient had a PCN reaction that required hospitalization: No Has patient had a  PCN reaction occurring within the last 10 years: Yes If all of the above answers are "NO", then may proceed with Cephalosporin use.    Singulair [Montelukast Sodium] Rash    "made heart race"    REVIEW OF SYSTEMS:  General: no fevers/chills/night sweats Eyes: no blurry vision, diplopia, or amaurosis ENT: no sore throat or hearing loss Resp: no cough, wheezing, or hemoptysis CV: no edema or palpitations GI: no abdominal pain, nausea, vomiting, diarrhea, or constipation GU: no dysuria, frequency, or hematuria Skin: no rash Neuro: no headache, numbness, tingling, or weakness of extremities Musculoskeletal: no joint pain or swelling Heme: no bleeding, DVT, or easy bruising Endo: no polydipsia or polyuria  BP 136/72   Pulse 75   Ht '5\' 1"'$  (1.549 m)   Wt 158 lb 9.6 oz (71.9 kg)   LMP 08/18/2020 (Approximate) Comment: Tubal ligation  SpO2 96%   BMI 29.97 kg/m   PHYSICAL EXAM: GEN:  AO x 3 in no acute distress HEENT: normal Dentition: Poor Neck: JVP normal. +2 carotid upstrokes without bruits; radiation of aortic stenosis murmur is appreciated bilaterally. No thyromegaly. Lungs: equal expansion, clear bilaterally CV: Apex is discrete and nondisplaced, RRR with 2 out of 6 systolic murmur and diastolic murmur Abd: soft, non-tender, non-distended; no bruit; positive bowel sounds Ext: no edema, ecchymoses, or cyanosis Vascular: 2+ femoral pulses, 2+ radial pulses       Skin: warm and dry without rash Neuro: CN II-XII grossly intact; motor and sensory grossly intact    DATA AND STUDIES:  STRESS:  2023 Pharmacological myocardial perfusion imaging study with no significant  ischemia Normal wall motion, EF estimated at 50%, GI uptake artifact noted Fixed apical defect likely from attenuation artifact No EKG changes concerning for ischemia at peak stress or in recovery. CT attenuation correction images with trace aortic atherosclerosis, no significant coronary calcification Low  risk scan  EKG: Sinus rhythm with possible anterior infarction pattern  2D ECHO: 2024 1. Left ventricular ejection fraction, by estimation, is 60 to 65%. The  left ventricle has normal function. The left ventricle has no regional  wall motion abnormalities. The left ventricular internal cavity size was  mildly dilated. There is mild  concentric left ventricular hypertrophy. Left ventricular diastolic  parameters are indeterminate. The average left ventricular global  longitudinal strain is -18.8 %. The global longitudinal strain is normal.   2. Right ventricular systolic function is normal. The right ventricular  size is normal. Tricuspid regurgitation signal is inadequate for assessing  PA pressure.   3. The mitral valve is degenerative. Mild to moderate mitral valve  regurgitation. No evidence of mitral stenosis.   4. The aortic valve has an indeterminant number of cusps. There is  moderate calcification of the aortic valve. There is moderate thickening  of the aortic valve. Aortic valve regurgitation is moderate to severe.  Mild to moderate aortic valve stenosis.  Aortic regurgitation PHT measures 381 msec. Aortic valve area, by VTI  measures 1.47 cm. Aortic valve mean gradient measures 16.6 mmHg. Aortic  valve Vmax measures 3.03 m/s.   5. The inferior vena cava is normal in size with greater than 50%  respiratory variability, suggesting right atrial pressure of 3 mmHg.   6. Consider further workup with TEE to assess AI further.   CARDIAC CATH: n/a  STS RISK CALCULATOR: pending  NHYA CLASS: 2  ASSESSMENT AND PLAN:   Aortic valve stenosis, etiology of cardiac valve disease unspecified - Plan: Basic metabolic panel, CBC with Differential/Platelet, CANCELED: Basic metabolic panel, CANCELED: CBC with Differential/Platelet  Aortic valve insufficiency, etiology of cardiac valve disease unspecified - Plan: Basic metabolic panel, CBC with Differential/Platelet, CANCELED: Basic  metabolic panel, CANCELED: CBC with Differential/Platelet  Nonrheumatic mitral valve regurgitation - Plan: Basic metabolic panel, CBC with Differential/Platelet, CANCELED: Basic metabolic panel, CANCELED: CBC with Differential/Platelet  Cerebrovascular accident (CVA), unspecified mechanism ([place]) - Plan: Basic metabolic panel, CBC with Differential/Platelet, CANCELED: Basic metabolic panel, CANCELED: CBC with Differential/Platelet  Stage 3b chronic kidney disease ([place]) - Plan: Basic metabolic panel, CBC with Differential/Platelet, CANCELED: Basic metabolic panel, CANCELED: CBC with Differential/Platelet  Hyperlipidemia LDL goal <70 - Plan: Basic metabolic panel, CBC with Differential/Platelet, CANCELED: Basic metabolic panel, CANCELED: CBC with Differential/Platelet  Primary hypertension - Plan: Basic metabolic panel, CBC with Differential/Platelet, CANCELED: Basic metabolic panel, CANCELED: CBC with Differential/Platelet  I reviewed the patient's echocardiogram which shows at least moderate aortic stenosis with a pressure half-time of around 300 and mild to moderate aortic stenosis.  Taken together this may suggest severe aortic valvular disease.  The patient does develop symptoms at times when she is not wheezing.  I will obtain a TEE to evaluate further.  If this is indeed the case and the patient has severe aortic valvular disease I will refer her for coronary angiography study, CTA, and cardiothoracic surgical consult.  She is young however she has had multiple strokes and has chronic kidney disease so she may not be the most optimal surgical candidate.  If this were the case and there is enough calcification associated with her valve then a transcatheter attic valve replacement may be feasible.  I have personally reviewed the patients imaging data as summarized above.  I have reviewed the natural history of aortic stenosis with the patient and family members who are present today. We have  discussed the limitations of medical therapy and the poor prognosis associated with symptomatic aortic stenosis. We have also reviewed potential treatment options, including palliative medical therapy, conventional surgical aortic valve replacement, and transcatheter aortic valve replacement. We discussed treatment options in the context of this patient's specific comorbid medical conditions.   All of the patient's questions were answered today. Will make further recommendations based on the results of studies outlined above.   Total time spent with patient today 60 minutes. This includes reviewing records, evaluating the patient and coordinating care.   Early Osmond, MD  03/15/2022 5:39 PM    [facility] [place], [place], [place]  [id number] Phone: [phone]; Fax: [phone]

## 2022-03-01 NOTE — Patient Instructions (Addendum)
Medication Instructions:  Your physician recommends that you continue on your current medications as directed. Please refer to the Current Medication list given to you today. *If you need a refill on your cardiac medications before your next appointment, please call your pharmacy*  Lab Work: Your physician recommends that you return for lab work in: one week before procedure for BMET and CBC   If you have labs (blood work) drawn today and your tests are completely normal, you will receive your results only by: [place] (if you have MyChart) OR A paper copy in the mail If you have any lab test that is abnormal or we need to change your treatment, we will call you to review the results.  Testing/Procedures: Your physician has requested that you have a TEE. During a TEE, sound waves are used to create images of your heart. It provides your doctor with information about the size and shape of your heart and how well your heart's chambers and valves are working. In this test, a transducer is attached to the end of a flexible tube that's guided down your throat and into your esophagus (the tube leading from you mouth to your stomach) to get a more detailed image of your heart. You are not awake for the procedure. Please see the instruction sheet given to you today. For further information please visit [email or web address].  Follow-Up: At [facility], you and your health needs are our priority.  As part of our continuing mission to provide you with exceptional heart care, we have created designated Provider Care Teams.  These Care Teams include your primary Cardiologist (physician) and Advanced Practice Providers (APPs -  Physician Assistants and Nurse Practitioners) who all work together to provide you with the care you need, when you need it.  We recommend signing up for the patient portal called "MyChart".  Sign up information is provided on this After Visit Summary.  MyChart is used to  connect with patients for Virtual Visits (Telemedicine).  Patients are able to view lab/test results, encounter notes, upcoming appointments, etc.  Non-urgent messages can be sent to your provider as well.   To learn more about what you can do with MyChart, go to [email or web address].    Your next appointment:   6 month(s)  Provider:   Lenna Sciara, MD    Other Instructions     Dear Gabrielle Schultz  You are scheduled for a TEE (Transesophageal Echocardiogram) on Wednesday, March 13 with Dr. Sallyanne Kuster.  Please arrive at the [facility] (Main Entrance A) at [facility]: [address] [place], [place] [address] at 10:00 AM.   DIET:  Nothing to eat or drink after midnight except a sip of water with medications (see medication instructions below)  LABS:   1 week prior to procedure Come to the lab at [facility] at [address] between the hours of 8:00 am and 4:30 pm. You do NOT have to be fasting.  FYI:  For your safety, and to allow [place] to monitor your vital signs accurately during the surgery/procedure we request: If you have artificial nails, gel coating, SNS etc, please have those removed prior to your surgery/procedure. Not having the nail coverings /polish removed may result in cancellation or delay of your surgery/procedure.  You must have a responsible person to drive you home and stay in the waiting area during your procedure. Failure to do so could result in cancellation.  Bring your insurance cards.  *Special  Note: Every effort is made to have your procedure done on time. Occasionally there are emergencies that occur at the hospital that may cause delays. Please be patient if a delay does occur.

## 2022-03-01 NOTE — Progress Notes (Signed)
Patient ID: Gabrielle Schultz MRN: [id number] DOB/AGE: May 08, 1970 52 y.o.  Primary Care Physician:Markley, Vaughan Basta, [place] Primary Cardiologist: Ida Rogue, MD   FOCUSED CARDIOVASCULAR PROBLEM LIST:   1.  History of multiple strokes; bubble study echocardiogram 2023 was negative 2.  Hypertension 3.  Hyperlipidemia 4.  Asthma 5.  Poly-valvular disease with moderate aortic insufficiency with a pressure half-time of 389, moderate aortic stenosis with mean gradient of 16 mmHg and mild to moderate mitral regurgitation with an ejection fraction of 60 to 65% 6.  Stage III CKD 7.  35 p-y smoking history  HISTORY OF PRESENT ILLNESS: The patient is a 52 y.o. female with the indicated medical history here for recommendations regarding her aortic valvular disease.  Patient was recently admitted to the [facility] regional hospital with dysarthria and found to have suffered from stroke.  With a plan for indefinite dual antiplatelet therapy and statin therapy.  She has had multiple strokes and previous bubble study echo was negative.  She gets short of breath on a regular basis with wheezing.  Is reliably managed by her albuterol.  However she also does have episodes of shortness of breath where she is not wheezing.  She however denies any chest pain.  She did cannot sleep flat due to severe GERD.  She occasionally gets peripheral edema when she is up on her feet a lot.  This is typically better in the morning after she is had a night sleep.  She denies any presyncope, syncope, palpitations, severe bleeding, or signs or symptoms of recurrent stroke.  She does continue to smoke.  She has been smoking for about 35 years.  She has not seen a dentist in a very long time.  Past Medical History:  Diagnosis Date   Acid reflux    Allergic rhinitis 06/26/2015   Allergy    Asthma    Carpal tunnel syndrome    right hand   Chronic kidney disease    Colitis    CVA (cerebral infarction)    2009, 2015   Gout     History of syncope    Hypertension 2008   Low HDL (under 40) 06/26/2015   MI (myocardial infarction) ([place])    2015 - patient wasnt told by cardiologist that she had MI but has had cardiac cath in past   Migraines    Poor circulation    Renal insufficiency    Stroke [facility])    Syncope and collapse     Past Surgical History:  Procedure Laterality Date   BUBBLE STUDY  12/03/2019   Procedure: BUBBLE STUDY;  Surgeon: Geralynn Rile, MD;  Location: [place];  Service: Cardiovascular;;   CARDIAC CATHETERIZATION     CESAREAN SECTION  1994   Dr. Ammie Dalton   COLONOSCOPY WITH PROPOFOL N/A 06/26/2014   Procedure: COLONOSCOPY WITH PROPOFOL;  Surgeon: Josefine Class, MD;  Location: [facility] ENDOSCOPY;  Service: Endoscopy;  Laterality: N/A;   DILATION AND CURETTAGE OF UTERUS  x2 for incomplete SABs   TEE WITHOUT CARDIOVERSION N/A 12/03/2019   Procedure: TRANSESOPHAGEAL ECHOCARDIOGRAM (TEE);  Surgeon: Geralynn Rile, MD;  Location: [facility] ENDOSCOPY;  Service: Cardiovascular;  Laterality: N/A;    Family History  Problem Relation Age of Onset   Hyperlipidemia Mother    Heart disease Mother        has a defibrillator   COPD Mother    Hypertension Father    Heart disease Father    Hyperlipidemia Father    Hypertension Sister  died in her 74s from heart failure and end stage kidney disease   Kidney disease Sister    Hypertension Brother    Heart disease Brother        has a defibrillator   Coronary artery disease Brother        had CABG   Hyperlipidemia Brother    Healthy Child    Diabetes Child     Social History   Socioeconomic History   Marital status: Married    Spouse name: Not on file   Number of children: 2   Years of education: 11th grade   Highest education level: 11th grade  Occupational History   Occupation: unemployed  Tobacco Use   Smoking status: Every Day    Packs/day: 0.50    Years: 33.00    Total pack years: 16.50    Types: Cigarettes    Start  date: 03/04/2017   Smokeless tobacco: Never  Vaping Use   Vaping Use: Never used  Substance and Sexual Activity   Alcohol use: Yes    Comment: rare- drinks an occassional wine cooler   Drug use: No   Sexual activity: Yes    Partners: Male    Birth control/protection: None  Other Topics Concern   Not on file  Social History Narrative   Husband is a Administrator for [employer] and is the sole provider for the family including 2 children (52 yr old and 57 yr old)    Goes to food bank for food. Says "really worried" about water and light bill. Husband, baby, 39 year old, her, and mother all live together.    Patient would like appointment with [facility].       Pt is right handed   Social Determinants of Health   Financial Resource Strain: High Risk (08/02/2017)   Overall Financial Resource Strain (CARDIA)    Difficulty of Paying Living Expenses: Hard  Food Insecurity: Food Insecurity Present (05/21/2021)   Hunger Vital Sign    Worried About Running Out of Food in the Last Year: Sometimes true    Ran Out of Food in the Last Year: Sometimes true  Transportation Needs: No Transportation Needs (05/21/2021)   PRAPARE - Hydrologist (Medical): No    Lack of Transportation (Non-Medical): No  Physical Activity: Insufficiently Active (07/25/2018)   Exercise Vital Sign    Days of Exercise per Week: 2 days    Minutes of Exercise per Session: 60 min  Stress: Stress Concern Present (08/02/2017)   [place]    Feeling of Stress : Very much  Social Connections: Somewhat Isolated (08/02/2017)   Social Connection and Isolation Panel [NHANES]    Frequency of Communication with Friends and Family: More than three times a week    Frequency of Social Gatherings with Friends and Family: Never    Attends Religious Services: Never    Marine scientist or Organizations: No    Attends Archivist  Meetings: Never    Marital Status: Married  Human resources officer Violence: Not At Risk (08/02/2017)   Humiliation, Afraid, Rape, and Kick questionnaire    Fear of Current or Ex-Partner: No    Emotionally Abused: No    Physically Abused: No    Sexually Abused: No     Prior to Admission medications   Medication Sig Start Date End Date Taking? Authorizing Provider  acetaminophen (TYLENOL) 500 MG tablet Take 500 mg by  mouth every 6 (six) hours as needed for fever or mild pain.    [provider]  albuterol (PROVENTIL HFA) 108 (90 Base) MCG/ACT inhaler Inhale 2 puffs into the lungs once every 4 (four) hours as needed for wheezing (cough). 01/10/22   Loletha Grayer, MD  allopurinol (ZYLOPRIM) 100 MG tablet TAKE ONE TABLET BY MOUTH ONCE DAILY. Patient taking differently: Take 100 mg by mouth daily. 01/10/22   Loletha Grayer, MD  amLODipine (NORVASC) 2.5 MG tablet Take 1 tablet (2.5 mg total) by mouth daily. 02/05/22 05/06/22  Furth, Cadence H, PA-C  aspirin EC 81 MG tablet Take 1 tablet (81 mg total) by mouth once daily. Swallow whole. 01/10/22   Loletha Grayer, MD  atorvastatin (LIPITOR) 80 MG tablet Take 1 tablet (80 mg total) by mouth daily. 02/05/22   Furth, Cadence H, PA-C  Blood Pressure Monitor MISC Use as needed to check blood pressure 10/22/21   Iloabachie, Chioma E, NP  cetirizine (ZYRTEC) 10 MG tablet TAKE ONE TABLET BY MOUTH ONCE DAILY. 01/10/22   Loletha Grayer, MD  cloNIDine (CATAPRES) 0.3 MG tablet Take 1 tablet by mouth 3 times daily (morning, noon, and evening). 02/05/22   Furth, Cadence H, PA-C  clopidogrel (PLAVIX) 75 MG tablet Take 1 tablet (75 mg total) by mouth once daily. 02/05/22   Furth, Cadence H, PA-C  fluticasone (FLONASE) 50 MCG/ACT nasal spray USE 1 SPRAY INTO BOTH NOSTRILS 2 TIMES A DAY 01/10/22   Loletha Grayer, MD  fluticasone-salmeterol [facility]) 250-50 MCG/ACT AEPB Inhale one puff into the lungs in the morning and at bedtime 01/10/22   Loletha Grayer, MD   furosemide (LASIX) 20 MG tablet Take 1 tablet (20 mg total) by mouth daily as needed for fluid or edema. 02/05/22   Furth, Cadence H, PA-C  losartan (COZAAR) 50 MG tablet Take 1 tablet (50 mg total) by mouth daily. 02/05/22   Furth, Cadence H, PA-C  omeprazole (PRILOSEC) 20 MG capsule Take 20 mg by mouth daily. 01/10/22   [provider]  pantoprazole (PROTONIX) 40 MG tablet Take 1 tablet (40 mg total) by mouth daily. 01/11/22   Loletha Grayer, MD  potassium chloride (KLOR-CON M) 10 MEQ tablet Take 1 tablet (10 mEq total) by mouth as needed (potassium). To take whenever you take lasix 01/10/22 02/09/22  Loletha Grayer, MD  promethazine (PHENERGAN) 25 MG tablet Take 25 mg by mouth as needed for nausea or vomiting.    [provider]  simvastatin (ZOCOR) 10 MG tablet TAKE ONE TABLET BY MOUTH EVERY DAY Patient not taking: Reported on 07/22/2020 05/22/20 0000000  Copland, Deirdre Evener, PA-C    Allergies  Allergen Reactions   Coconut (Cocos Nucifera) Hives and Swelling    Just coconut ("all coconut")   Amoxicillin Other (See Comments)    "makes me sick" "swelled up everywhere" Has patient had a PCN reaction causing immediate rash, facial/tongue/throat swelling, SOB or lightheadedness with hypotension: No Has patient had a PCN reaction causing severe rash involving mucus membranes or skin necrosis: No Has patient had a PCN reaction that required hospitalization: No Has patient had a PCN reaction occurring within the last 10 years: No If all of the above answers are "NO", then may proceed with Cephalosporin use.     Penicillins Other (See Comments)    "makes me sick" "swelled up oil" Has patient had a PCN reaction causing immediate rash, facial/tongue/throat swelling, SOB or lightheadedness with hypotension: No Has patient had a PCN reaction causing severe rash involving  mucus membranes or skin necrosis: No Has patient had a PCN reaction that required hospitalization: No Has patient had a  PCN reaction occurring within the last 10 years: Yes If all of the above answers are "NO", then may proceed with Cephalosporin use.    Singulair [Montelukast Sodium] Rash    "made heart race"    REVIEW OF SYSTEMS:  General: no fevers/chills/night sweats Eyes: no blurry vision, diplopia, or amaurosis ENT: no sore throat or hearing loss Resp: no cough, wheezing, or hemoptysis CV: no edema or palpitations GI: no abdominal pain, nausea, vomiting, diarrhea, or constipation GU: no dysuria, frequency, or hematuria Skin: no rash Neuro: no headache, numbness, tingling, or weakness of extremities Musculoskeletal: no joint pain or swelling Heme: no bleeding, DVT, or easy bruising Endo: no polydipsia or polyuria  BP 136/72   Pulse 75   Ht '5\' 1"'$  (1.549 m)   Wt 158 lb 9.6 oz (71.9 kg)   LMP 08/18/2020 (Approximate) Comment: Tubal ligation  SpO2 96%   BMI 29.97 kg/m   PHYSICAL EXAM: GEN:  AO x 3 in no acute distress HEENT: normal Dentition: Poor Neck: JVP normal. +2 carotid upstrokes without bruits; radiation of aortic stenosis murmur is appreciated bilaterally. No thyromegaly. Lungs: equal expansion, clear bilaterally CV: Apex is discrete and nondisplaced, RRR with 2 out of 6 systolic murmur and diastolic murmur Abd: soft, non-tender, non-distended; no bruit; positive bowel sounds Ext: no edema, ecchymoses, or cyanosis Vascular: 2+ femoral pulses, 2+ radial pulses       Skin: warm and dry without rash Neuro: CN II-XII grossly intact; motor and sensory grossly intact    DATA AND STUDIES:  STRESS:  2023 Pharmacological myocardial perfusion imaging study with no significant  ischemia Normal wall motion, EF estimated at 50%, GI uptake artifact noted Fixed apical defect likely from attenuation artifact No EKG changes concerning for ischemia at peak stress or in recovery. CT attenuation correction images with trace aortic atherosclerosis, no significant coronary calcification Low  risk scan  EKG: Sinus rhythm with possible anterior infarction pattern  2D ECHO: 2024 1. Left ventricular ejection fraction, by estimation, is 60 to 65%. The  left ventricle has normal function. The left ventricle has no regional  wall motion abnormalities. The left ventricular internal cavity size was  mildly dilated. There is mild  concentric left ventricular hypertrophy. Left ventricular diastolic  parameters are indeterminate. The average left ventricular global  longitudinal strain is -18.8 %. The global longitudinal strain is normal.   2. Right ventricular systolic function is normal. The right ventricular  size is normal. Tricuspid regurgitation signal is inadequate for assessing  PA pressure.   3. The mitral valve is degenerative. Mild to moderate mitral valve  regurgitation. No evidence of mitral stenosis.   4. The aortic valve has an indeterminant number of cusps. There is  moderate calcification of the aortic valve. There is moderate thickening  of the aortic valve. Aortic valve regurgitation is moderate to severe.  Mild to moderate aortic valve stenosis.  Aortic regurgitation PHT measures 381 msec. Aortic valve area, by VTI  measures 1.47 cm. Aortic valve mean gradient measures 16.6 mmHg. Aortic  valve Vmax measures 3.03 m/s.   5. The inferior vena cava is normal in size with greater than 50%  respiratory variability, suggesting right atrial pressure of 3 mmHg.   6. Consider further workup with TEE to assess AI further.   CARDIAC CATH: n/a  STS RISK CALCULATOR: pending  NHYA CLASS: 2  ASSESSMENT AND PLAN:   Aortic valve stenosis, etiology of cardiac valve disease unspecified - Plan: Basic metabolic panel, CBC with Differential/Platelet, CANCELED: Basic metabolic panel, CANCELED: CBC with Differential/Platelet  Aortic valve insufficiency, etiology of cardiac valve disease unspecified - Plan: Basic metabolic panel, CBC with Differential/Platelet, CANCELED: Basic  metabolic panel, CANCELED: CBC with Differential/Platelet  Nonrheumatic mitral valve regurgitation - Plan: Basic metabolic panel, CBC with Differential/Platelet, CANCELED: Basic metabolic panel, CANCELED: CBC with Differential/Platelet  Cerebrovascular accident (CVA), unspecified mechanism ([place]) - Plan: Basic metabolic panel, CBC with Differential/Platelet, CANCELED: Basic metabolic panel, CANCELED: CBC with Differential/Platelet  Stage 3b chronic kidney disease ([place]) - Plan: Basic metabolic panel, CBC with Differential/Platelet, CANCELED: Basic metabolic panel, CANCELED: CBC with Differential/Platelet  Hyperlipidemia LDL goal <70 - Plan: Basic metabolic panel, CBC with Differential/Platelet, CANCELED: Basic metabolic panel, CANCELED: CBC with Differential/Platelet  Primary hypertension - Plan: Basic metabolic panel, CBC with Differential/Platelet, CANCELED: Basic metabolic panel, CANCELED: CBC with Differential/Platelet  I reviewed the patient's echocardiogram which shows at least moderate aortic stenosis with a pressure half-time of around 300 and mild to moderate aortic stenosis.  Taken together this may suggest severe aortic valvular disease.  The patient does develop symptoms at times when she is not wheezing.  I will obtain a TEE to evaluate further.  If this is indeed the case and the patient has severe aortic valvular disease I will refer her for coronary angiography study, CTA, and cardiothoracic surgical consult.  She is young however she has had multiple strokes and has chronic kidney disease so she may not be the most optimal surgical candidate.  If this were the case and there is enough calcification associated with her valve then a transcatheter attic valve replacement may be feasible.  I have personally reviewed the patients imaging data as summarized above.  I have reviewed the natural history of aortic stenosis with the patient and family members who are present today. We have  discussed the limitations of medical therapy and the poor prognosis associated with symptomatic aortic stenosis. We have also reviewed potential treatment options, including palliative medical therapy, conventional surgical aortic valve replacement, and transcatheter aortic valve replacement. We discussed treatment options in the context of this patient's specific comorbid medical conditions.   All of the patient's questions were answered today. Will make further recommendations based on the results of studies outlined above.   Total time spent with patient today 60 minutes. This includes reviewing records, evaluating the patient and coordinating care.   Early Osmond, MD  03/15/2022 5:39 PM    [facility] [address], [place], [place]  [address] Phone: [phone]; Fax: [phone]

## 2022-03-02 ENCOUNTER — Other Ambulatory Visit: Payer: Self-pay | Admitting: Internal Medicine

## 2022-03-02 DIAGNOSIS — I359 Nonrheumatic aortic valve disorder, unspecified: Secondary | ICD-10-CM

## 2022-03-09 ENCOUNTER — Telehealth: Payer: Self-pay | Admitting: Cardiovascular Disease

## 2022-03-09 NOTE — Telephone Encounter (Signed)
Pt would like a callback regarding required dental appt that is needed before procedure on 03/16/2022. Please advise.

## 2022-03-09 NOTE — Telephone Encounter (Signed)
Returned call to patient. Unable to leave voicemail

## 2022-03-11 NOTE — Telephone Encounter (Signed)
Called and spoke w patient.  Adv that no dental eval is needed for TEE and depending on what is seen on TEE she may continue w TAVR work up - and if she does--may need dental referral at that point.    She reports that she now has medicaid but has called at least 7 dental offices and has not found any that will take medicaid.

## 2022-03-15 ENCOUNTER — Other Ambulatory Visit
Admission: RE | Admit: 2022-03-15 | Discharge: 2022-03-15 | Disposition: A | Discharge status: Home / Self Care | Payer: Medicaid Other | Attending: Internal Medicine | Admitting: Internal Medicine

## 2022-03-15 DIAGNOSIS — I639 Cerebral infarction, unspecified: Secondary | ICD-10-CM | POA: Insufficient documentation

## 2022-03-15 DIAGNOSIS — I350 Nonrheumatic aortic (valve) stenosis: Secondary | ICD-10-CM | POA: Insufficient documentation

## 2022-03-15 DIAGNOSIS — E785 Hyperlipidemia, unspecified: Secondary | ICD-10-CM | POA: Diagnosis not present

## 2022-03-15 DIAGNOSIS — I351 Nonrheumatic aortic (valve) insufficiency: Secondary | ICD-10-CM | POA: Diagnosis not present

## 2022-03-15 DIAGNOSIS — I340 Nonrheumatic mitral (valve) insufficiency: Secondary | ICD-10-CM | POA: Diagnosis not present

## 2022-03-15 DIAGNOSIS — I10 Essential (primary) hypertension: Secondary | ICD-10-CM | POA: Diagnosis not present

## 2022-03-15 DIAGNOSIS — N1832 Chronic kidney disease, stage 3b: Secondary | ICD-10-CM | POA: Insufficient documentation

## 2022-03-15 LAB — CBC WITH DIFFERENTIAL/PLATELET
Abs Immature Granulocytes: 0.01 10*3/uL (ref 0.00–0.07)
Basophils Absolute: 0.1 10*3/uL (ref 0.0–0.1)
Basophils Relative: 1 %
Eosinophils Absolute: 0.6 10*3/uL — ABNORMAL HIGH (ref 0.0–0.5)
Eosinophils Relative: 7 %
HCT: 33.2 % — ABNORMAL LOW (ref 36.0–46.0)
Hemoglobin: 10.9 g/dL — ABNORMAL LOW (ref 12.0–15.0)
Immature Granulocytes: 0 %
Lymphocytes Relative: 30 %
Lymphs Abs: 2.5 10*3/uL (ref 0.7–4.0)
MCH: 33.6 pg (ref 26.0–34.0)
MCHC: 32.8 g/dL (ref 30.0–36.0)
MCV: 102.5 fL — ABNORMAL HIGH (ref 80.0–100.0)
Monocytes Absolute: 0.5 10*3/uL (ref 0.1–1.0)
Monocytes Relative: 6 %
Neutro Abs: 4.7 10*3/uL (ref 1.7–7.7)
Neutrophils Relative %: 56 %
Platelets: 232 10*3/uL (ref 150–400)
RBC: 3.24 MIL/uL — ABNORMAL LOW (ref 3.87–5.11)
RDW: 17.7 % — ABNORMAL HIGH (ref 11.5–15.5)
WBC: 8.3 10*3/uL (ref 4.0–10.5)
nRBC: 0 % (ref 0.0–0.2)

## 2022-03-15 LAB — BASIC METABOLIC PANEL
Anion gap: 6 (ref 5–15)
BUN: 20 mg/dL (ref 6–20)
CO2: 23 mmol/L (ref 22–32)
Calcium: 8.8 mg/dL — ABNORMAL LOW (ref 8.9–10.3)
Chloride: 108 mmol/L (ref 98–111)
Creatinine, Ser: 1.57 mg/dL — ABNORMAL HIGH (ref 0.44–1.00)
GFR, Estimated: 40 mL/min — ABNORMAL LOW (ref >60)
Glucose, Bld: 66 mg/dL — ABNORMAL LOW (ref 70–99)
Potassium: 3.9 mmol/L (ref 3.5–5.1)
Sodium: 137 mmol/L (ref 135–145)

## 2022-03-17 ENCOUNTER — Ambulatory Visit (HOSPITAL_BASED_OUTPATIENT_CLINIC_OR_DEPARTMENT_OTHER)
Admission: RE | Admit: 2022-03-17 | Discharge: 2022-03-17 | Disposition: A | Discharge status: Home / Self Care | Payer: Medicaid Other | Source: Ambulatory Visit | Attending: Internal Medicine | Admitting: Internal Medicine

## 2022-03-17 ENCOUNTER — Ambulatory Visit (HOSPITAL_COMMUNITY)
Admission: RE | Admit: 2022-03-17 | Discharge: 2022-03-17 | Disposition: A | Discharge status: Home / Self Care | Payer: Medicaid Other | Source: Ambulatory Visit | Attending: Cardiovascular Disease | Admitting: Cardiovascular Disease

## 2022-03-17 ENCOUNTER — Other Ambulatory Visit: Payer: Self-pay

## 2022-03-17 ENCOUNTER — Ambulatory Visit (HOSPITAL_COMMUNITY): Payer: Medicaid Other | Admitting: Certified Registered Nurse Anesthetist

## 2022-03-17 ENCOUNTER — Ambulatory Visit (HOSPITAL_BASED_OUTPATIENT_CLINIC_OR_DEPARTMENT_OTHER): Payer: Medicaid Other | Admitting: Certified Registered Nurse Anesthetist

## 2022-03-17 ENCOUNTER — Encounter (HOSPITAL_COMMUNITY)
Admission: RE | Disposition: A | Discharge status: Home / Self Care | Payer: Self-pay | Source: Ambulatory Visit | Attending: Cardiovascular Disease

## 2022-03-17 DIAGNOSIS — F17210 Nicotine dependence, cigarettes, uncomplicated: Secondary | ICD-10-CM

## 2022-03-17 DIAGNOSIS — I080 Rheumatic disorders of both mitral and aortic valves: Secondary | ICD-10-CM | POA: Insufficient documentation

## 2022-03-17 DIAGNOSIS — I252 Old myocardial infarction: Secondary | ICD-10-CM

## 2022-03-17 DIAGNOSIS — J45909 Unspecified asthma, uncomplicated: Secondary | ICD-10-CM | POA: Diagnosis not present

## 2022-03-17 DIAGNOSIS — I061 Rheumatic aortic insufficiency: Secondary | ICD-10-CM

## 2022-03-17 DIAGNOSIS — I359 Nonrheumatic aortic valve disorder, unspecified: Secondary | ICD-10-CM | POA: Diagnosis present

## 2022-03-17 DIAGNOSIS — I352 Nonrheumatic aortic (valve) stenosis with insufficiency: Secondary | ICD-10-CM

## 2022-03-17 DIAGNOSIS — J449 Chronic obstructive pulmonary disease, unspecified: Secondary | ICD-10-CM | POA: Diagnosis not present

## 2022-03-17 DIAGNOSIS — I083 Combined rheumatic disorders of mitral, aortic and tricuspid valves: Secondary | ICD-10-CM | POA: Diagnosis not present

## 2022-03-17 DIAGNOSIS — I10 Essential (primary) hypertension: Secondary | ICD-10-CM | POA: Diagnosis not present

## 2022-03-17 HISTORY — PX: TEE WITHOUT CARDIOVERSION: SHX5443

## 2022-03-17 HISTORY — PX: BUBBLE STUDY: SHX6837

## 2022-03-17 LAB — ECHO TEE
AR max vel: 1.51 cm2
AV Area VTI: 1.38 cm2
AV Area mean vel: 1.52 cm2
AV Mean grad: 11 mmHg
AV Peak grad: 21.9 mmHg
Ao pk vel: 2.34 m/s
MV VTI: 5.38 cm2
P 1/2 time: 341 msec

## 2022-03-17 SURGERY — ECHOCARDIOGRAM, TRANSESOPHAGEAL
Anesthesia: Monitor Anesthesia Care

## 2022-03-17 MED ORDER — LIDOCAINE 2% (20 MG/ML) 5 ML SYRINGE
INTRAMUSCULAR | Status: DC | PRN
  Administered 2022-03-17: 100 mg via INTRAVENOUS

## 2022-03-17 MED ORDER — PROPOFOL 500 MG/50ML IV EMUL
INTRAVENOUS | Status: DC | PRN
  Administered 2022-03-17: 100 ug/kg/min via INTRAVENOUS

## 2022-03-17 MED ORDER — SODIUM CHLORIDE 0.9 % IV SOLN: INTRAVENOUS | Status: DC

## 2022-03-17 MED ORDER — PROPOFOL 10 MG/ML IV BOLUS
INTRAVENOUS | Status: DC | PRN
  Administered 2022-03-17: 20 mg via INTRAVENOUS

## 2022-03-17 NOTE — Interval H&P Note (Signed)
History and Physical Interval Note:  03/17/2022 10:49 AM  Gabrielle Schultz  has presented today for surgery, with the diagnosis of AORTIC STENOSIS.  The various methods of treatment have been discussed with the patient and family. After consideration of risks, benefits and other options for treatment, the patient has consented to  Procedure(s): TRANSESOPHAGEAL ECHOCARDIOGRAM (TEE) (N/A) as a surgical intervention.  The patient's history has been reviewed, patient examined, no change in status, stable for surgery.  I have reviewed the patient's chart and labs.  Questions were answered to the patient's satisfaction.     [name]

## 2022-03-17 NOTE — Anesthesia Preprocedure Evaluation (Signed)
Anesthesia Evaluation  Patient identified by MRN, date of birth, ID band Patient awake    Reviewed: Allergy & Precautions, NPO status , Patient's Chart, lab work & pertinent test results  Airway Mallampati: II  TM Distance: >3 FB Neck ROM: Full    Dental  (+) Dental Advisory Given, Loose, Missing,    Pulmonary asthma , COPD, Current SmokerPatient did not abstain from smoking.   Pulmonary exam normal breath sounds clear to auscultation       Cardiovascular hypertension, Pt. on medications (-) angina + Past MI  (-) Cardiac Stents + Valvular Problems/Murmurs AS  Rhythm:Regular Rate:Normal + Systolic murmurs    Neuro/Psych  Headaches TIACVA, Residual Symptoms    GI/Hepatic Neg liver ROS,GERD  Medicated,,  Endo/Other  negative endocrine ROS    Renal/GU Renal InsufficiencyRenal disease     Musculoskeletal negative musculoskeletal ROS (+)    Abdominal   Peds  Hematology  (+) Blood dyscrasia, anemia   Anesthesia Other Findings Day of surgery medications reviewed with the patient.  Reproductive/Obstetrics                             Anesthesia Physical Anesthesia Plan  ASA: 3  Anesthesia Plan: MAC   Post-op Pain Management: Minimal or no pain anticipated   Induction: Intravenous  PONV Risk Score and Plan: 1 and TIVA and Treatment may vary due to age or medical condition  Airway Management Planned: Natural Airway and Simple Face Mask  Additional Equipment:   Intra-op Plan:   Post-operative Plan:   Informed Consent: I have reviewed the patients History and Physical, chart, labs and discussed the procedure including the risks, benefits and alternatives for the proposed anesthesia with the patient or authorized representative who has indicated his/her understanding and acceptance.     Dental advisory given  Plan Discussed with: CRNA  Anesthesia Plan Comments:        Anesthesia  Quick Evaluation

## 2022-03-17 NOTE — Op Note (Signed)
INDICATIONS: Aortic insufficiency  PROCEDURE:   Informed consent was obtained prior to the procedure. The risks, benefits and alternatives for the procedure were discussed and the patient comprehended these risks.  Risks include, but are not limited to, cough, sore throat, vomiting, nausea, somnolence, esophageal and stomach trauma or perforation, bleeding, low blood pressure, aspiration, pneumonia, infection, trauma to the teeth and death.    After a procedural time-out, the oropharynx was anesthetized with 20% benzocaine spray.   During this procedure the patient was administered IV propofol by Anesthesiology, Dr. Gifford Shave.  The transesophageal probe was inserted in the esophagus and stomach without difficulty and multiple views were obtained.  The patient was kept under observation until the patient left the procedure room.  The patient left the procedure room in stable condition.   Agitated microbubble saline contrast was administered. Negative for shunt  COMPLICATIONS:    There were no immediate complications.  FINDINGS:  Trileaflet aortic valve with rheumatic changes. There is mild AS and at least moderate to severe AI. Calcification is relatively mild. Mild-moderate MR  RECOMMENDATIONS:     Although the color Doppler appearance is of severe AI and 3D ERO area is 0.4 cm sq, the pressure half time and the flow profile in the descending aorta do not support severe AI. Further quantitation to follow.  Time Spent Directly with the Patient:  40 minutes   Gennie Eisinger 03/17/2022, 11:54 AM

## 2022-03-17 NOTE — Anesthesia Postprocedure Evaluation (Signed)
Anesthesia Post Note  Patient: Gabrielle Schultz  Procedure(s) Performed: TRANSESOPHAGEAL ECHOCARDIOGRAM (TEE) BUBBLE STUDY     Patient location during evaluation: Endoscopy Anesthesia Type: MAC Level of consciousness: oriented, awake and alert and awake Pain management: pain level controlled Vital Signs Assessment: post-procedure vital signs reviewed and stable Respiratory status: spontaneous breathing, nonlabored ventilation, respiratory function stable and patient connected to nasal cannula oxygen Cardiovascular status: blood pressure returned to baseline and stable Postop Assessment: no headache, no backache and no apparent nausea or vomiting Anesthetic complications: no   No notable events documented.  Last Vitals:  Vitals:   03/17/22 1208 03/17/22 1213  BP: (!) 143/55 (!) 141/63  Pulse: 63 (!) 58  Resp: (!) 21 14  Temp:    SpO2: 96% 97%    Last Pain:  Vitals:   03/17/22 1213  TempSrc:   PainSc: 0-No pain                 Santa Lighter

## 2022-03-17 NOTE — Discharge Instructions (Signed)

## 2022-03-17 NOTE — Transfer of Care (Signed)
Immediate Anesthesia Transfer of Care Note  Patient: Gabrielle Schultz  Procedure(s) Performed: TRANSESOPHAGEAL ECHOCARDIOGRAM (TEE) BUBBLE STUDY  Patient Location: Endoscopy Unit  Anesthesia Type:MAC  Level of Consciousness: drowsy  Airway & Oxygen Therapy: Patient Spontanous Breathing and Patient connected to nasal cannula oxygen  Post-op Assessment: Report given to RN and Post -op Vital signs reviewed and stable  Post vital signs: Reviewed and stable  Last Vitals:  Vitals Value Taken Time  BP    Temp    Pulse 58 03/17/22 1152  Resp 16 03/17/22 1152  SpO2 94 % 03/17/22 1152  Vitals shown include unvalidated device data.  Last Pain:  Vitals:   03/17/22 1037  TempSrc: Temporal  PainSc: 0-No pain         Complications: No notable events documented.

## 2022-03-18 ENCOUNTER — Encounter: Payer: Self-pay | Admitting: Cardiology

## 2022-03-19 ENCOUNTER — Encounter: Payer: Self-pay | Admitting: Obstetrics and Gynecology

## 2022-03-19 ENCOUNTER — Other Ambulatory Visit: Payer: Medicaid Other | Admitting: Obstetrics and Gynecology

## 2022-03-19 NOTE — Patient Outreach (Signed)
Medicaid Managed Care   Nurse Care Manager Note  03/19/2022 Name:  Gabrielle Schultz MRN:  [id number] DOB:  [birth year]  Gabrielle Schultz is an 52 y.o. year old female who is a primary patient of Gabrielle Schultz, [place].  The [facility] Managed Care Coordination team was consulted for assistance with:    Chronic healthcare management needs, LVA, stroke, MI, migraines, HTN, TIA, AVD, asthma, rhinitis, COPD, sinusitis, dermatitis, CKD, tobacco use, HLD  Gabrielle Schultz was given information about Medicaid Managed Care Coordination team services today. Gabrielle Schultz Patient agreed to services and verbal consent obtained.  Engaged with patient by telephone for initial visit in response to provider referral for case management and/or care coordination services.   Assessments/Interventions:  Review of past medical history, allergies, medications, health status, including review of consultants reports, laboratory and other test data, was performed as part of comprehensive evaluation and provision of chronic care management services.  SDOH (Social Determinants of Health) assessments and interventions performed: SDOH Interventions    Flowsheet Row Office Visit from 05/22/2020 in [place]  SDOH Interventions   Food Insecurity Interventions Other (Comment)  [food bank and husband's income]  Transportation Interventions Intervention Not Indicated     Care Plan  Allergies  Allergen Reactions   Coconut (Cocos Nucifera) Hives and Swelling    Just coconut ("all coconut")   Amoxicillin Other (See Comments)    "makes me sick" "swelled up everywhere" Has patient had a PCN reaction causing immediate rash, facial/tongue/throat swelling, SOB or lightheadedness with hypotension: No Has patient had a PCN reaction causing severe rash involving mucus membranes or skin necrosis: No Has patient had a PCN reaction that required hospitalization: No Has patient had a PCN reaction occurring within the last 10  years: No If all of the above answers are "NO", then may proceed with Cephalosporin use.     Penicillins Other (See Comments)    "makes me sick" "swelled up oil" Has patient had a PCN reaction causing immediate rash, facial/tongue/throat swelling, SOB or lightheadedness with hypotension: No Has patient had a PCN reaction causing severe rash involving mucus membranes or skin necrosis: No Has patient had a PCN reaction that required hospitalization: No Has patient had a PCN reaction occurring within the last 10 years: Yes If all of the above answers are "NO", then may proceed with Cephalosporin use.    Singulair [Montelukast Sodium] Rash    "made heart race"    Medications Reviewed Today     Reviewed by Gayla Medicus, RN (Registered Nurse) on 03/19/22 at 1347  Med List Status: <None>   Medication Order Taking? Sig Documenting Provider Last Dose Status Informant  acetaminophen (TYLENOL) 650 MG CR tablet [id number] No Take 650-1,300 mg by mouth every 8 (eight) hours as needed for pain. [provider] Past Month Active Self  albuterol (PROVENTIL HFA) 108 (90 Base) MCG/ACT inhaler [id number] No Inhale 2 puffs into the lungs once every 4 (four) hours as needed for wheezing (cough). Loletha Grayer, MD 03/16/2022 Active Self  allopurinol (ZYLOPRIM) 100 MG tablet [id number] No TAKE ONE TABLET BY MOUTH ONCE DAILY. Loletha Grayer, MD 03/16/2022 Active Self  amLODipine (NORVASC) 2.5 MG tablet [id number] No Take 1 tablet (2.5 mg total) by mouth daily. Furth, Cadence H, PA-C 03/17/2022 Active Self  aspirin EC 81 MG tablet [id number] No Take 1 tablet (81 mg total) by mouth once daily. Swallow whole. Loletha Grayer, MD 03/17/2022 Active Self  atorvastatin (LIPITOR) 80 MG tablet [id number]  No Take 1 tablet (80 mg total) by mouth daily.  Patient taking differently: Take 80 mg by mouth at bedtime.   Furth, Cadence H, PA-C 03/16/2022 Active Self  cetirizine (ZYRTEC) 10 MG tablet [id number] No TAKE ONE  TABLET BY MOUTH ONCE DAILY. Loletha Grayer, MD 03/16/2022 Active Self  cloNIDine (CATAPRES) 0.3 MG tablet [id number] No Take 1 tablet by mouth 3 times daily (morning, noon, and evening). Furth, Cadence H, PA-C 03/17/2022 Active Self  clopidogrel (PLAVIX) 75 MG tablet [id number] No Take 1 tablet (75 mg total) by mouth once daily. Furth, Cadence H, PA-C 03/17/2022 Active Self  docusate sodium (COLACE) 100 MG capsule [id number] No Take 100 mg by mouth in the morning. [provider] 03/16/2022 Active Self  fluticasone (FLONASE) 50 MCG/ACT nasal spray [id number] No USE 1 SPRAY INTO BOTH NOSTRILS 2 TIMES A DAY  Patient taking differently: Place 1 spray into both nostrils at bedtime. USE 1 SPRAY INTO BOTH NOSTRILS 2 TIMES A DAY   Wieting, Richard, MD 03/16/2022 Active Self  fluticasone-salmeterol [facility] INHUB) 250-50 MCG/ACT AEPB [id number] No Inhale one puff into the lungs in the morning and at bedtime Loletha Grayer, MD Past Week Active Self  furosemide (LASIX) 20 MG tablet [id number] No Take 1 tablet (20 mg total) by mouth daily as needed for fluid or edema.  Patient taking differently: Take 20 mg by mouth in the morning.   Furth, Cadence H, PA-C 03/16/2022 Active Self  losartan (COZAAR) 50 MG tablet [id number] No Take 1 tablet (50 mg total) by mouth daily.  Patient taking differently: Take 25 mg by mouth in the morning.   Kathlen Mody, Cadence H, PA-C 03/17/2022 Active Self           Med Note Kenton Kingfisher, Earley Favor   Fri Mar 12, 2022  1:48 PM)    omeprazole (PRILOSEC) 20 MG capsule [id number] No Take 20 mg by mouth daily before breakfast. [provider] 03/16/2022 Active Self  pantoprazole (PROTONIX) 40 MG tablet [id number] No Take 1 tablet (40 mg total) by mouth daily.  Patient taking differently: Take 40 mg by mouth daily as needed (indigestion/heartburn.).   Loletha Grayer, MD 03/16/2022 Active Self  potassium chloride (KLOR-CON M) 10 MEQ tablet [id number] No Take 1 tablet (10 mEq total) by mouth as  needed (potassium). To take whenever you take lasix Loletha Grayer, MD Past Week Active Self  promethazine (PHENERGAN) 25 MG tablet [id number] No Take 25 mg by mouth as needed for nausea or vomiting. 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   Cryptogenic stroke (H[facility])    Hyperlipidemia LDL goal <70    AKI (acute kidney injury) ([place])    Obesity    MI (myocardial infarction) ([place])    Migraines    Stroke (cerebrum) ([place]) 11/30/2019   Benign essential hypertension 12/23/2018   Edema of lower extremity 12/23/2018   Recurrent urinary tract infection 12/23/2018  Edema of both feet 09/23/2018   Urine frequency 10/06/2017   SIRS (systemic inflammatory response syndrome) ([place]) 01/18/2017   Abdominal pain 01/18/2017   Aortic regurgitation 09/08/2016   Chest pain 08/28/2016   Hypertensive emergency 07/31/2016   AMA (advanced maternal age) multigravida 35+, third trimester [id number]   Asthma complicating pregnancy, antepartum 07/30/2016   Chronic hypertension affecting pregnancy 07/30/2016   H/O myocardial ischemia 07/30/2016   H/O: C-section 07/30/2016   H/O: stroke 07/30/2016   IUGR (intrauterine growth restriction) affecting care of mother, second trimester, fetus 1 07/30/2016   Supervision of high risk pregnancy in third trimester 07/30/2016   Edema of both feet 04/22/2016   Abnormal  TSH 04/22/2016   Abdominal pain, right upper quadrant 12/02/2015   Dermatitis 12/02/2015   Urinary tract infection 10/28/2015   Low HDL (under 40) 06/26/2015   Allergic rhinitis 06/26/2015   Smoker 06/26/2015   Cerebral infarct ([place]) 11/21/2013   Chronic kidney disease 11/21/2013   LVH (left ventricular hypertrophy) 08/12/2011   Chronic renal insufficiency 08/12/2011   Conditions to be addressed/monitored per PCP order:  Chronic healthcare management needs, LVA, stroke, MI, migraines, HTN, TIA, AVD, asthma, rhinitis, COPD, sinusitis, dermatitis, CKD, tobacco use, HLD  Care Plan : RN Care Manager Plan of Care  Updates made by Gayla Medicus, RN since 03/19/2022 12:00 AM     Problem: Health Promotion or Disease Self-Management (General Plan of Care)      Long-Range Goal: Chronic Disease Management   Start Date: 03/19/2022  Expected End Date: 06/19/2022  Priority: High  Note:   Current Barriers:  Knowledge Deficits related to plan of care for management of LVA, stroke, MI, migraines, HTN, TIA, AVD, asthma, rhinitis, COPD, sinusitis, dermatitis, CKD, tobacco use, HLD  Chronic Disease Management support and education needs related to LVA, stroke, MI, migraines, HTN, TIA, AVD, asthma, rhinitis, COPD, sinusitis, dermatitis, CKD, tobacco use, HLD  03/19/22:  Patient checks blood pressure three times a day-has appt with [facility] 3/25 for evaluation of leaking aortic valve.  Smokes less than 1ppd-no desire to quit.  Headaches and breathing managed with meds.  RNCM Clinical Goal(s):  Patient will verbalize understanding of plan for management of LVA, stroke, MI, migraines, HTN, TIA, AVD, asthma, rhinitis, COPD, sinusitis, dermatitis, CKD, tobacco use, HLD as evidenced by patient report verbalize basic understanding of LVA, stroke, MI, migraines, HTN, TIA, AVD, asthma, rhinitis, COPD, sinusitis, dermatitis, CKD, tobacco use, HLD   disease process and self health management plan as evidenced by patient  report take all medications exactly as prescribed and will call provider for medication related questions as evidenced by patient report demonstrate understanding of rationale for each prescribed medication as evidenced by patient report attend all scheduled medical appointments as evidenced by patient report and EMR review demonstrate ongoing  adherence to prescribed treatment plan for LVA, stroke, MI, migraines, HTN, TIA, AVD, asthma, rhinitis, COPD, sinusitis, dermatitis, CKD, tobacco use, HLD    as evidenced by patient report and EMR review continue to work with RN Care Manager to address care management and care coordination needs related to LVA, stroke, MI, migraines, HTN, TIA, AVD, asthma, rhinitis, COPD, sinusitis, dermatitis, CKD, tobacco use, HLD  as evidenced by adherence to CM Team Scheduled appointments through collaboration with RN Care manager, provider, and care team.   Interventions: Inter-disciplinary care team collaboration (see longitudinal plan of care) Evaluation of current treatment plan related to  self management and patient's adherence to plan as established by provider  Smoking Cessation  Interventions:  (Status:  New goal.) Long Term Goal Reviewed smoking history:   currently smoking less than 1ppd ppd-patient has no desire to quit smoking Evaluation of current treatment plan reviewed Reviewed scheduled/upcoming provider appointments  Discussed plans with patient for ongoing care management follow up and provided patient with direct contact information for care management team Assessed social determinant of health barriers  Asthma: (Status:New goal.) Long Term Goal Discussed the importance of adequate rest and management of fatigue with Asthma Assessed social determinant of health barriers    Chronic Kidney Disease Interventions:  (Status:  New goal.) Long Term Goal Evaluation of current treatment plan related to chronic kidney disease self management and patient's  adherence to plan as established by provider      Reviewed prescribed diet Reviewed medications with patient and discussed importance of compliance    Reviewed scheduled/upcoming provider appointments including    Discussed plans with patient for ongoing care management follow up and provided patient with direct contact information for care management team    Assessed social determinant of health barriers    Last practice recorded BP readings:  BP Readings from Last 3 Encounters:  03/17/22 (!) 141/63  03/01/22 136/72  03/01/22 133/71  Most recent eGFR/CrCl:  Lab Results  Component Value Date   EGFR 39 (L) 11/19/2021    No components found for: "CRCL"  COPD Interventions:  (Status:  New goal.) Long Term Goal Discussed the importance of adequate rest and management of fatigue with COPD Assessed social determinant of health barriers  Hyperlipidemia Interventions:  (Status:  New goal.) Long Term Goal Medication review performed; medication list updated in electronic medical record.  Provider established cholesterol goals reviewed Counseled on importance of regular laboratory monitoring as prescribed Reviewed importance of limiting foods high in cholesterol Assessed social determinant of health barriers   Hypertension Interventions:  (Status:  New goal.) Long Term Goal Last practice recorded BP readings:  BP Readings from Last 3 Encounters:  03/17/22 (!) 141/63  03/01/22 136/72  03/01/22 133/71  Most recent eGFR/CrCl:  Lab Results  Component Value Date   EGFR 39 (L) 11/19/2021    No components found for: "CRCL"  Evaluation of current treatment plan related to hypertension self management and patient's adherence to plan as established by provider Reviewed medications with patient and discussed importance of compliance Discussed plans with patient for ongoing care management follow up and provided patient with direct contact information for care management team Reviewed  scheduled/upcoming provider appointments including:  Discussed complications of poorly controlled blood pressure such as heart disease, stroke, circulatory complications, vision complications, kidney impairment, sexual dysfunction Assessed social determinant of health barriers  Patient Goals/Self-Care Activities: Take all medications as prescribed Attend all scheduled provider appointments Call pharmacy for medication refills 3-7 days in advance of running out of medications Perform all self care activities independently  Perform IADL's (shopping, preparing meals, housekeeping, managing finances) independently Call provider office for new concerns or questions   Follow Up Plan:  The patient has been provided with contact information for the care management team and has been advised to call with any health related questions or concerns.  The care management team will reach out to the patient again over the next 30 business  days.    Long-Range Goal: Establish Plan of Care for Chronic Disease Management Needs   Priority: High  Note:   Timeframe:  Long-Range Goal Priority:  High Start Date:  03/19/22  Expected End Date: ongoing                      Follow Up Date 04/20/22   - practice safe sex - schedule appointment for vaccines needed due to my age or health - schedule recommended health tests (blood work, mammogram, colonoscopy, pap test) - schedule and keep appointment for annual check-up    Why is this important?   Screening tests can find diseases early when they are easier to treat.  Your doctor or nurse will talk with you about which tests are important for you.  Getting shots for common diseases like the flu and shingles will help prevent them.    Follow Up:  Patient agrees to Care Plan and Follow-up.  Plan: The Managed Medicaid care management team will reach out to the patient again over the next 30 business  days. and The  Patient has been provided  with contact information for the Managed Medicaid care management team and has been advised to call with any health related questions or concerns.  Date/time of next scheduled RN care management/care coordination outreach: 04/20/22 at 1030.

## 2022-03-19 NOTE — Patient Instructions (Signed)
Hi Gabrielle Schultz, thanks for speaking with me today-have a great afternoon!!  Gabrielle Schultz was given information about Medicaid Managed Care team care coordination services as a part of their [place] Complete Medicaid benefit. Gabrielle Schultz verbally consented to engagement with the [facility] Managed Care team.   If you are experiencing a medical emergency, please call 911 or report to your local emergency department or urgent care.   If you have a non-emergency medical problem during routine business hours, please contact your provider's office and ask to speak with a nurse.   For questions related to your [place] Complete Medicaid health plan, please call: [phone]  If you would like to schedule transportation through your [place] Complete Medicaid plan, please call the following number at least 2 days in advance of your appointment: [phone].   There is no limit to the number of trips during the year between medical appointments, healthcare facilities, or pharmacies. Transportation must be scheduled at least 2 business days before but not more than thirty 30 days before of your appointment.  Call the [facility] at [phone], at any time, 24 hours a day, 7 days a week. If you are in danger or need immediate medical attention call 911.  If you would like help to quit smoking, call 1-800-QUIT-NOW (410)843-3135) OR Espaol: 1-855-Djelo-Ya QO:409462) o para ms informacin haga clic aqu or Text READY to 200-400 to register via text  Gabrielle Schultz - following are the goals we discussed in your visit today:   Timeframe:  Long-Range Goal Priority:  High Start Date:  03/19/22                           Expected End Date: ongoing                      Follow Up Date 04/20/22   - practice safe sex - schedule appointment for vaccines needed due to my age or health - schedule recommended health tests (blood work, mammogram, colonoscopy, pap test) - schedule and keep  appointment for annual check-up    Why is this important?   Screening tests can find diseases early when they are easier to treat.  Your doctor or nurse will talk with you about which tests are important for you.  Getting shots for common diseases like the flu and shingles will help prevent them.   Patient verbalizes understanding of instructions and care plan provided today and agrees to view in [place]. Active MyChart status and patient understanding of how to access instructions and care plan via MyChart confirmed with patient.     The Managed Medicaid care management team will reach out to the patient again over the next 30 business  days.  The  Patient  has been provided with contact information for the Managed Medicaid care management team and has been advised to call with any health related questions or concerns.   Gabrielle Schultz, BSN [facility] Management Coordinator - Managed Medicaid High Risk [phone]   Following is a copy of your plan of care:  Care Plan : [facility] Line of Care  Updates made by Gabrielle Medicus, Schultz since 03/19/2022 12:00 AM     Problem: Health Promotion or Disease Self-Management (General Plan of Care)      Long-Range Goal: Chronic Disease Management   Start Date: 03/19/2022  Expected End Date: 06/19/2022  Priority: High  Note:   Current Barriers:  Knowledge Deficits related to plan of care for management of LVA, stroke, MI, migraines, HTN, TIA, AVD, asthma, rhinitis, COPD, sinusitis, dermatitis, CKD, tobacco use, HLD  Chronic Disease Management support and education needs related to LVA, stroke, MI, migraines, HTN, TIA, AVD, asthma, rhinitis, COPD, sinusitis, dermatitis, CKD, tobacco use, HLD  03/19/22:  Patient checks blood pressure three times a day-has appt with [facility] 3/25 for evaluation of leaking aortic valve.  Smokes less than 1ppd-no desire to quit.  Headaches and breathing managed with meds.  RNCM Clinical  Goal(s):  Patient will verbalize understanding of plan for management of LVA, stroke, MI, migraines, HTN, TIA, AVD, asthma, rhinitis, COPD, sinusitis, dermatitis, CKD, tobacco use, HLD as evidenced by patient report verbalize basic understanding of LVA, stroke, MI, migraines, HTN, TIA, AVD, asthma, rhinitis, COPD, sinusitis, dermatitis, CKD, tobacco use, HLD   disease process and self health management plan as evidenced by patient report take all medications exactly as prescribed and will call provider for medication related questions as evidenced by patient report demonstrate understanding of rationale for each prescribed medication as evidenced by patient report attend all scheduled medical appointments as evidenced by patient report and EMR review demonstrate ongoing  adherence to prescribed treatment plan for LVA, stroke, MI, migraines, HTN, TIA, AVD, asthma, rhinitis, COPD, sinusitis, dermatitis, CKD, tobacco use, HLD    as evidenced by patient report and EMR review continue to work with Schultz Care Manager to address care management and care coordination needs related to LVA, stroke, MI, migraines, HTN, TIA, AVD, asthma, rhinitis, COPD, sinusitis, dermatitis, CKD, tobacco use, HLD  as evidenced by adherence to CM Team Scheduled appointments through collaboration with Schultz Care manager, provider, and care team.   Interventions: Inter-disciplinary care team collaboration (see longitudinal plan of care) Evaluation of current treatment plan related to  self management and patient's adherence to plan as established by provider  Smoking Cessation Interventions:  (Status:  New goal.) Long Term Goal Reviewed smoking history:   currently smoking less than 1ppd ppd-patient has no desire to quit smoking Evaluation of current treatment plan reviewed Reviewed scheduled/upcoming provider appointments  Discussed plans with patient for ongoing care management follow up and provided patient with direct contact  information for care management team Assessed social determinant of health barriers  Asthma: (Status:New goal.) Long Term Goal Discussed the importance of adequate rest and management of fatigue with Asthma Assessed social determinant of health barriers    Chronic Kidney Disease Interventions:  (Status:  New goal.) Long Term Goal Evaluation of current treatment plan related to chronic kidney disease self management and patient's adherence to plan as established by provider      Reviewed prescribed diet Reviewed medications with patient and discussed importance of compliance    Reviewed scheduled/upcoming provider appointments including    Discussed plans with patient for ongoing care management follow up and provided patient with direct contact information for care management team    Assessed social determinant of health barriers    Last practice recorded BP readings:  BP Readings from Last 3 Encounters:  03/17/22 (!) 141/63  03/01/22 136/72  03/01/22 133/71  Most recent eGFR/CrCl:  Lab Results  Component Value Date   EGFR 39 (L) 11/19/2021    No components found for: "CRCL"  COPD Interventions:  (Status:  New goal.) Long Term Goal Discussed the importance of adequate rest and management of fatigue with COPD Assessed social determinant of health barriers  Hyperlipidemia Interventions:  (Status:  New goal.) Long Term Goal Medication review performed; medication list updated in electronic medical record.  Provider established cholesterol goals reviewed Counseled on importance of regular laboratory monitoring as prescribed Reviewed importance of limiting foods high in cholesterol Assessed social determinant of health barriers   Hypertension Interventions:  (Status:  New goal.) Long Term Goal Last practice recorded BP readings:  BP Readings from Last 3 Encounters:  03/17/22 (!) 141/63  03/01/22 136/72  03/01/22 133/71  Most recent eGFR/CrCl:  Lab Results  Component Value Date    EGFR 39 (L) 11/19/2021    No components found for: "CRCL"  Evaluation of current treatment plan related to hypertension self management and patient's adherence to plan as established by provider Reviewed medications with patient and discussed importance of compliance Discussed plans with patient for ongoing care management follow up and provided patient with direct contact information for care management team Reviewed scheduled/upcoming provider appointments including:  Discussed complications of poorly controlled blood pressure such as heart disease, stroke, circulatory complications, vision complications, kidney impairment, sexual dysfunction Assessed social determinant of health barriers  Patient Goals/Self-Care Activities: Take all medications as prescribed Attend all scheduled provider appointments Call pharmacy for medication refills 3-7 days in advance of running out of medications Perform all self care activities independently  Perform IADL's (shopping, preparing meals, housekeeping, managing finances) independently Call provider office for new concerns or questions   Follow Up Plan:  The patient has been provided with contact information for the care management team and has been advised to call with any health related questions or concerns.  The care management team will reach out to the patient again over the next 30 business  days.

## 2022-03-22 ENCOUNTER — Ambulatory Visit: Payer: Medicaid Other | Admitting: Nurse Practitioner

## 2022-03-22 ENCOUNTER — Encounter: Payer: Self-pay | Admitting: Nurse Practitioner

## 2022-03-22 VITALS — BP 114/66 | HR 88 | Temp 98.3°F | Resp 16 | Ht 61.0 in | Wt 159.8 lb

## 2022-03-22 DIAGNOSIS — J4541 Moderate persistent asthma with (acute) exacerbation: Secondary | ICD-10-CM

## 2022-03-22 DIAGNOSIS — M109 Gout, unspecified: Secondary | ICD-10-CM

## 2022-03-22 DIAGNOSIS — Z8673 Personal history of transient ischemic attack (TIA), and cerebral infarction without residual deficits: Secondary | ICD-10-CM

## 2022-03-22 DIAGNOSIS — I10 Essential (primary) hypertension: Secondary | ICD-10-CM | POA: Diagnosis not present

## 2022-03-22 DIAGNOSIS — I38 Endocarditis, valve unspecified: Secondary | ICD-10-CM

## 2022-03-22 DIAGNOSIS — R600 Localized edema: Secondary | ICD-10-CM

## 2022-03-22 DIAGNOSIS — J3089 Other allergic rhinitis: Secondary | ICD-10-CM

## 2022-03-22 DIAGNOSIS — Z79899 Other long term (current) drug therapy: Secondary | ICD-10-CM | POA: Diagnosis not present

## 2022-03-22 MED ORDER — ALLOPURINOL 100 MG PO TABS: ORAL_TABLET | Freq: Every day | ORAL | 3 refills | Status: DC

## 2022-03-22 MED ORDER — PROMETHAZINE HCL 25 MG PO TABS: 25.0000 mg | ORAL_TABLET | ORAL | 2 refills | Status: DC | PRN

## 2022-03-22 MED ORDER — OMEPRAZOLE 20 MG PO CPDR: 20.0000 mg | DELAYED_RELEASE_CAPSULE | Freq: Every day | ORAL | 1 refills | Status: DC

## 2022-03-22 MED ORDER — FLUTICASONE-SALMETEROL 250-50 MCG/ACT IN AEPB: INHALATION_SPRAY | RESPIRATORY_TRACT | 11 refills | Status: DC

## 2022-03-22 MED ORDER — ALBUTEROL SULFATE HFA 108 (90 BASE) MCG/ACT IN AERS: 2.0000 | INHALATION_SPRAY | RESPIRATORY_TRACT | 5 refills | Status: DC | PRN

## 2022-03-22 MED ORDER — ASPIRIN 81 MG PO TBEC: 81.0000 mg | DELAYED_RELEASE_TABLET | Freq: Every day | ORAL | 0 refills | Status: DC

## 2022-03-22 MED ORDER — PANTOPRAZOLE SODIUM 40 MG PO TBEC: 40.0000 mg | DELAYED_RELEASE_TABLET | Freq: Every day | ORAL | 1 refills | Status: DC

## 2022-03-22 MED ORDER — POTASSIUM CHLORIDE CRYS ER 10 MEQ PO TBCR: 10.0000 meq | EXTENDED_RELEASE_TABLET | Freq: Every day | ORAL | 1 refills | Status: DC

## 2022-03-22 MED ORDER — CETIRIZINE HCL 10 MG PO TABS: ORAL_TABLET | Freq: Every day | ORAL | 3 refills | Status: DC

## 2022-03-22 NOTE — Progress Notes (Signed)
[facility] [place], [place] [address]  Internal MEDICINE  Office Visit Note  Patient Name: Gabrielle Schultz  [id number]  [id number]  Date of Service: 03/22/2022   Complaints/HPI Pt is here for establishment of PCP. Chief Complaint  Patient presents with   Gastroesophageal Reflux   Hypertension    HPI Gabrielle Schultz presents for a new patient visit to establish care.  Well-appearing 52 y.o. female with History of 7 strokes, kidney disease and gout, and valvular heart disease.  Having open heart surgery for aortic valve replacement soon, waiting until after easter if possible Last annual physical exam/wellness visit was in may last year. Work: not working right now Home: live at home with family.  Diet: cardiac and colitis diet  Exercise: walking  Tobacco use: 1 pack per 3 days  Alcohol use: seldom  Illicit drug use: none  Routine CRC screening: done in 2016, found a few polyps then  Routine mammogram: need mammogram but will wait until later in the year  Pap smear: due now, will wait until after recover from surgery.  Labs: done recently and will have a lot more done with upcoming surgery New or worsening pain: none  Dr. Holley Raring for nephro Dr. [name] for cardio Open heart surgery will be at [facility] in [place].     Current Medication: Outpatient Encounter Medications as of 03/22/2022  Medication Sig   acetaminophen (TYLENOL) 650 MG CR tablet Take 650-1,300 mg by mouth every 8 (eight) hours as needed for pain.   amLODipine (NORVASC) 2.5 MG tablet Take 1 tablet (2.5 mg total) by mouth daily.   atorvastatin (LIPITOR) 80 MG tablet Take 1 tablet (80 mg total) by mouth daily. (Patient taking differently: Take 80 mg by mouth at bedtime.)   cloNIDine (CATAPRES) 0.3 MG tablet Take 1 tablet by mouth 3 times daily (morning, noon, and evening).   clopidogrel (PLAVIX) 75 MG tablet Take 1 tablet (75 mg total) by mouth once daily.   docusate sodium  (COLACE) 100 MG capsule Take 100 mg by mouth in the morning.   fluticasone (FLONASE) 50 MCG/ACT nasal spray USE 1 SPRAY INTO BOTH NOSTRILS 2 TIMES A DAY (Patient taking differently: Place 1 spray into both nostrils at bedtime. USE 1 SPRAY INTO BOTH NOSTRILS 2 TIMES A DAY)   furosemide (LASIX) 20 MG tablet Take 1 tablet (20 mg total) by mouth daily as needed for fluid or edema. (Patient taking differently: Take 20 mg by mouth in the morning.)   losartan (COZAAR) 50 MG tablet Take 1 tablet (50 mg total) by mouth daily. (Patient taking differently: Take 25 mg by mouth in the morning.)   [DISCONTINUED] albuterol (PROVENTIL HFA) 108 (90 Base) MCG/ACT inhaler Inhale 2 puffs into the lungs once every 4 (four) hours as needed for wheezing (cough).   [DISCONTINUED] allopurinol (ZYLOPRIM) 100 MG tablet TAKE ONE TABLET BY MOUTH ONCE DAILY.   [DISCONTINUED] aspirin EC 81 MG tablet Take 1 tablet (81 mg total) by mouth once daily. Swallow whole.   [DISCONTINUED] cetirizine (ZYRTEC) 10 MG tablet TAKE ONE TABLET BY MOUTH ONCE DAILY.   [DISCONTINUED] fluticasone-salmeterol (WIXELA INHUB) 250-50 MCG/ACT AEPB Inhale one puff into the lungs in the morning and at bedtime   [DISCONTINUED] omeprazole (PRILOSEC) 20 MG capsule Take 20 mg by mouth daily before breakfast.   [DISCONTINUED] pantoprazole (PROTONIX) 40 MG tablet Take 1 tablet (40 mg total) by mouth daily. (Patient taking differently: Take 40 mg by mouth daily as needed (indigestion/heartburn.).)   [  DISCONTINUED] potassium chloride (KLOR-CON M) 10 MEQ tablet Take 1 tablet (10 mEq total) by mouth as needed (potassium). To take whenever you take lasix   [DISCONTINUED] promethazine (PHENERGAN) 25 MG tablet Take 25 mg by mouth as needed for nausea or vomiting.   albuterol (PROVENTIL HFA) 108 (90 Base) MCG/ACT inhaler Inhale 2 puffs into the lungs once every 4 (four) hours as needed for wheezing (cough).   allopurinol (ZYLOPRIM) 100 MG tablet TAKE ONE TABLET BY MOUTH ONCE  DAILY.   aspirin EC 81 MG tablet Take 1 tablet (81 mg total) by mouth once daily. Swallow whole.   cetirizine (ZYRTEC) 10 MG tablet TAKE ONE TABLET BY MOUTH ONCE DAILY.   fluticasone-salmeterol (WIXELA INHUB) 250-50 MCG/ACT AEPB Inhale one puff into the lungs in the morning and at bedtime   omeprazole (PRILOSEC) 20 MG capsule Take 1 capsule (20 mg total) by mouth daily before breakfast.   pantoprazole (PROTONIX) 40 MG tablet Take 1 tablet (40 mg total) by mouth daily.   potassium chloride (KLOR-CON M) 10 MEQ tablet Take 1 tablet (10 mEq total) by mouth daily. To take whenever you take lasix   promethazine (PHENERGAN) 25 MG tablet Take 1 tablet (25 mg total) by mouth as needed for nausea or vomiting.   [DISCONTINUED] simvastatin (ZOCOR) 10 MG tablet TAKE ONE TABLET BY MOUTH EVERY DAY (Patient not taking: Reported on 07/22/2020)   No facility-administered encounter medications on file as of 03/22/2022.    Surgical History: Past Surgical History:  Procedure Laterality Date   BUBBLE STUDY  12/03/2019   Procedure: BUBBLE STUDY;  Surgeon: Geralynn Rile, MD;  Location: [place];  Service: Cardiovascular;;   BUBBLE STUDY  03/17/2022   Procedure: BUBBLE STUDY;  Surgeon: Sanda Klein, MD;  Location: [place];  Service: Cardiovascular;;   CARDIAC CATHETERIZATION     CESAREAN SECTION  1994   Dr. Ammie Dalton   COLONOSCOPY WITH PROPOFOL N/A 06/26/2014   Procedure: COLONOSCOPY WITH PROPOFOL;  Surgeon: Josefine Class, MD;  Location: [facility] ENDOSCOPY;  Service: Endoscopy;  Laterality: N/A;   DILATION AND CURETTAGE OF UTERUS  x2 for incomplete SABs   TEE WITHOUT CARDIOVERSION N/A 12/03/2019   Procedure: TRANSESOPHAGEAL ECHOCARDIOGRAM (TEE);  Surgeon: Geralynn Rile, MD;  Location: [place];  Service: Cardiovascular;  Laterality: N/A;   TEE WITHOUT CARDIOVERSION N/A 03/17/2022   Procedure: TRANSESOPHAGEAL ECHOCARDIOGRAM (TEE);  Surgeon: Sanda Klein, MD;  Location: [place];  Service: Cardiovascular;  Laterality: N/A;    Medical History: Past Medical History:  Diagnosis Date   Acid reflux    Allergic rhinitis 06/26/2015   Allergy    Asthma    Carpal tunnel syndrome    right hand   Chronic kidney disease    Colitis    CVA (cerebral infarction)    2009, 2015   Gout    History of syncope    Hypertension 2008   Low HDL (under 40) 06/26/2015   MI (myocardial infarction) ([place])    2015 - patient wasnt told by cardiologist that she had MI but has had cardiac cath in past   Migraines    Poor circulation    Renal insufficiency    Stroke [facility])    Syncope and collapse     Family History: Family History  Problem Relation Age of Onset   Hyperlipidemia Mother    Heart disease Mother        has a defibrillator   COPD Mother    Hypertension Father  Heart disease Father    Hyperlipidemia Father    Hypertension Sister        died in her 40s from heart failure and end stage kidney disease   Kidney disease Sister    Hypertension Brother    Heart disease Brother        has a defibrillator   Coronary artery disease Brother        had CABG   Hyperlipidemia Brother    Healthy Child    Diabetes Child     Social History   Socioeconomic History   Marital status: Married    Spouse name: Not on file   Number of children: 2   Years of education: 11th grade   Highest education level: 11th grade  Occupational History   Occupation: unemployed  Tobacco Use   Smoking status: Every Day    Packs/day: 0.50    Years: 33.00    Additional pack years: 0.00    Total pack years: 16.50    Types: Cigarettes    Start date: 03/04/2017   Smokeless tobacco: Never  Vaping Use   Vaping Use: Never used  Substance and Sexual Activity   Alcohol use: Yes    Comment: rare- drinks an occassional wine cooler   Drug use: No   Sexual activity: Yes    Partners: Male    Birth control/protection: None  Other Topics Concern   Not on file  Social History  Narrative   Husband is a Administrator for [employer] and is the sole provider for the family including 2 children (48 yr old and 35 yr old)    Goes to food bank for food. Says "really worried" about water and light bill. Husband, baby, 61 year old, her, and mother all live together.    Patient would like appointment with [facility].       Pt is right handed   Social Determinants of Health   Financial Resource Strain: High Risk (08/02/2017)   Overall Financial Resource Strain (CARDIA)    Difficulty of Paying Living Expenses: Hard  Food Insecurity: Food Insecurity Present (05/21/2021)   Hunger Vital Sign    Worried About Running Out of Food in the Last Year: Sometimes true    Ran Out of Food in the Last Year: Sometimes true  Transportation Needs: No Transportation Needs (05/21/2021)   PRAPARE - Hydrologist (Medical): No    Lack of Transportation (Non-Medical): No  Physical Activity: Insufficiently Active (07/25/2018)   Exercise Vital Sign    Days of Exercise per Week: 2 days    Minutes of Exercise per Session: 60 min  Stress: Stress Concern Present (08/02/2017)   [place]    Feeling of Stress : Very much  Social Connections: Somewhat Isolated (08/02/2017)   Social Connection and Isolation Panel [NHANES]    Frequency of Communication with Friends and Family: More than three times a week    Frequency of Social Gatherings with Friends and Family: Never    Attends Religious Services: Never    Marine scientist or Organizations: No    Attends Archivist Meetings: Never    Marital Status: Married  Human resources officer Violence: Not At Risk (03/19/2022)   Humiliation, Afraid, Rape, and Kick questionnaire    Fear of Current or Ex-Partner: No    Emotionally Abused: No    Physically Abused: No    Sexually Abused: No  Review of Systems  Constitutional:  Positive for activity change  and fatigue. Negative for chills and unexpected weight change.  HENT:  Positive for postnasal drip. Negative for congestion, rhinorrhea, sneezing and sore throat.   Eyes:  Negative for redness.  Respiratory:  Positive for cough and shortness of breath. Negative for chest tightness and wheezing.   Cardiovascular:  Positive for chest pain (intermittent related to heart disease) and palpitations (sometimes).  Gastrointestinal:  Negative for abdominal pain, constipation, diarrhea, nausea and vomiting.       Is on colitis diet, can have symptoms if having a flare, includes abdominal pain, diarrhea and abdominal distention, cramping and bloating.   Genitourinary:  Negative for dysuria and frequency.  Musculoskeletal:  Negative for arthralgias, back pain, joint swelling and neck pain.  Skin:  Negative for rash.  Neurological: Negative.  Negative for tremors and numbness.  Hematological:  Negative for adenopathy. Does not bruise/bleed easily.  Psychiatric/Behavioral:  Negative for behavioral problems (Depression), sleep disturbance and suicidal ideas. The patient is not nervous/anxious.     Vital Signs: BP 114/66   Pulse 88   Temp 98.3 F (36.8 C)   Resp 16   Ht 5\' 1"  (1.549 m)   Wt 159 lb 12.8 oz (72.5 kg)   LMP 08/18/2020 (Approximate) Comment: Tubal ligation  SpO2 98%   BMI 30.19 kg/m    Physical Exam Vitals reviewed.  Constitutional:      General: She is not in acute distress.    Appearance: Normal appearance. She is obese. She is ill-appearing.  HENT:     Head: Normocephalic and atraumatic.  Eyes:     Pupils: Pupils are equal, round, and reactive to light.  Cardiovascular:     Rate and Rhythm: Normal rate and regular rhythm.     Heart sounds: Murmur heard.  Pulmonary:     Effort: Pulmonary effort is normal. No respiratory distress.     Breath sounds: Normal breath sounds.  Neurological:     Mental Status: She is alert and oriented to person, place, and time.  Psychiatric:         Mood and Affect: Mood normal.        Behavior: Behavior normal.       Assessment/Plan: 1. Primary hypertension Stable, well controlled. Continue medications as prescribed.   2. Moderate persistent asthma with acute exacerbation Continue wixela inhaler for maintenance therapy and albuterol rescue inhaler prn as prescribed.  - fluticasone-salmeterol (WIXELA INHUB) 250-50 MCG/ACT AEPB; Inhale one puff into the lungs in the morning and at bedtime  Dispense: 60 each; Refill: 11 - albuterol (PROVENTIL HFA) 108 (90 Base) MCG/ACT inhaler; Inhale 2 puffs into the lungs once every 4 (four) hours as needed for wheezing (cough).  Dispense: 18 g; Refill: 5  3. Valvular heart disease Has upcoming surgery to replace aortic valve, sees cardiology and cardiothoracic surgery.   4. Gout, unspecified cause, unspecified chronicity, unspecified site Continue allopurinol for prevention of gout attacks as prescribed, stable, controlled per patient - allopurinol (ZYLOPRIM) 100 MG tablet; TAKE ONE TABLET BY MOUTH ONCE DAILY.  Dispense: 90 tablet; Refill: 3  5. History of multiple strokes Has had 7 strokes with minimal residual deficit. Is at risk of subsequent stroke in future, is on necessary medications to help prevent future strokes.   6. Encounter for medication review Medication list reviewed with patient and updated. Refills ordered  - aspirin EC 81 MG tablet; Take 1 tablet (81 mg total) by mouth once daily. Swallow whole.  Dispense: 30 tablet; Refill: 0 - allopurinol (ZYLOPRIM) 100 MG tablet; TAKE ONE TABLET BY MOUTH ONCE DAILY.  Dispense: 90 tablet; Refill: 3 - cetirizine (ZYRTEC) 10 MG tablet; TAKE ONE TABLET BY MOUTH ONCE DAILY.  Dispense: 90 tablet; Refill: 3 - pantoprazole (PROTONIX) 40 MG tablet; Take 1 tablet (40 mg total) by mouth daily.  Dispense: 90 tablet; Refill: 1 - omeprazole (PRILOSEC) 20 MG capsule; Take 1 capsule (20 mg total) by mouth daily before breakfast.  Dispense: 90  capsule; Refill: 1 - potassium chloride (KLOR-CON M) 10 MEQ tablet; Take 1 tablet (10 mEq total) by mouth daily. To take whenever you take lasix  Dispense: 90 tablet; Refill: 1 - promethazine (PHENERGAN) 25 MG tablet; Take 1 tablet (25 mg total) by mouth as needed for nausea or vomiting.  Dispense: 30 tablet; Refill: 2 - fluticasone-salmeterol (WIXELA INHUB) 250-50 MCG/ACT AEPB; Inhale one puff into the lungs in the morning and at bedtime  Dispense: 60 each; Refill: 11 - albuterol (PROVENTIL HFA) 108 (90 Base) MCG/ACT inhaler; Inhale 2 puffs into the lungs once every 4 (four) hours as needed for wheezing (cough).  Dispense: 18 g; Refill: 5    General Counseling: Janesha verbalizes understanding of the findings of todays visit and agrees with plan of treatment. I have discussed any further diagnostic evaluation that may be needed or ordered today. We also reviewed her medications today. she has been encouraged to call the office with any questions or concerns that should arise related to todays visit.    No orders of the defined types were placed in this encounter.   Meds ordered this encounter  Medications   aspirin EC 81 MG tablet    Sig: Take 1 tablet (81 mg total) by mouth once daily. Swallow whole.    Dispense:  30 tablet    Refill:  0   allopurinol (ZYLOPRIM) 100 MG tablet    Sig: TAKE ONE TABLET BY MOUTH ONCE DAILY.    Dispense:  90 tablet    Refill:  3   cetirizine (ZYRTEC) 10 MG tablet    Sig: TAKE ONE TABLET BY MOUTH ONCE DAILY.    Dispense:  90 tablet    Refill:  3   pantoprazole (PROTONIX) 40 MG tablet    Sig: Take 1 tablet (40 mg total) by mouth daily.    Dispense:  90 tablet    Refill:  1    Please discontinue omeprazole   omeprazole (PRILOSEC) 20 MG capsule    Sig: Take 1 capsule (20 mg total) by mouth daily before breakfast.    Dispense:  90 capsule    Refill:  1   potassium chloride (KLOR-CON M) 10 MEQ tablet    Sig: Take 1 tablet (10 mEq total) by mouth daily.  To take whenever you take lasix    Dispense:  90 tablet    Refill:  1   promethazine (PHENERGAN) 25 MG tablet    Sig: Take 1 tablet (25 mg total) by mouth as needed for nausea or vomiting.    Dispense:  30 tablet    Refill:  2   fluticasone-salmeterol (WIXELA INHUB) 250-50 MCG/ACT AEPB    Sig: Inhale one puff into the lungs in the morning and at bedtime    Dispense:  60 each    Refill:  11   albuterol (PROVENTIL HFA) 108 (90 Base) MCG/ACT inhaler    Sig: Inhale 2 puffs into the lungs once every 4 (four) hours as needed for wheezing (cough).  Dispense:  18 g    Refill:  5    Return in about 3 months (around 06/22/2022) for CPE, Keelyn Monjaras PCP in mid to late june. .  Time spent:30 Minutes Time spent with patient included reviewing progress notes, labs, imaging studies, and discussing plan for follow up.   [place] Controlled Substance Database was reviewed by me for overdose risk score (ORS)   This patient was seen by Jonetta Osgood, FNP-C in collaboration with Dr. Clayborn Bigness as a part of collaborative care agreement.   Ludene Stokke R. Valetta Fuller, MSN, FNP-C Internal Medicine

## 2022-03-24 ENCOUNTER — Telehealth: Payer: Self-pay

## 2022-03-24 NOTE — Telephone Encounter (Signed)
PA was done for [employer].

## 2022-03-24 NOTE — Telephone Encounter (Signed)
PA was approved for [employer] and patient was notified.

## 2022-03-24 NOTE — Telephone Encounter (Signed)
I discussed this pt's case further with Dr Gabrielle Schultz and after her TEE the pt was scheduled to see Dr Gabrielle Schultz on 3/25.  The patient will need cardiac catheterization performed and begin dental evaluation and treatment prior to seeing cardiac surgeon.  I contacted the patient and made her aware of this information and canceled the 3/25 apt with Dr Gabrielle Schultz.  The patient would like to have her cardiac catheterization performed by Dr Gabrielle Schultz at [facility], rather than coming to [place].  I advised the patient that I will contact his office so that arrangements for cath can be made.  I discussed the need for dental evaluation and the pt has [place] Medicaid and there are only certain dentists that she can see with this insurance.  She has a list of providers and plans to call for an appointment. After cardiac cath and dental evaluation are completed, the structural team will assist with arranging cardiac surgeon evaluation.

## 2022-03-28 ENCOUNTER — Encounter: Payer: Self-pay | Admitting: Nurse Practitioner

## 2022-03-29 ENCOUNTER — Encounter: Payer: Medicaid Other | Admitting: Thoracic Surgery (Cardiothoracic Vascular Surgery)

## 2022-04-20 ENCOUNTER — Other Ambulatory Visit: Payer: Medicaid Other | Admitting: Obstetrics and Gynecology

## 2022-04-20 ENCOUNTER — Ambulatory Visit: Payer: Medicaid Other | Admitting: Medical

## 2022-04-20 ENCOUNTER — Encounter: Payer: Self-pay | Admitting: Obstetrics and Gynecology

## 2022-04-20 NOTE — Patient Instructions (Signed)
Hi Gabrielle Schultz, thanks for speaking with me-have a great day!  Gabrielle Schultz was given information about Medicaid Managed Care team care coordination services as a part of their [place] Medicaid benefit. Gabrielle Schultz verbally consented to engagement with the [facility] Managed Care team.   If you are experiencing a medical emergency, please call 911 or report to your local emergency department or urgent care.   If you have a non-emergency medical problem during routine business hours, please contact your provider's office and ask to speak with a nurse.   For questions related to your [place] health plan, please call: [phone]  If you would like to schedule transportation through your [place] plan, please call the following number at least 2 days in advance of your appointment: [phone].   There is no limit to the number of trips during the year between medical appointments, healthcare facilities, or pharmacies. Transportation must be scheduled at least 2 business days before but not more than thirty 30 days before of your appointment.  Call the Behavioral Health Crisis Line at [phone], at any time, 24 hours a day, 7 days a week. If you are in danger or need immediate medical attention call 911.  If you would like help to quit smoking, call 1-800-QUIT-NOW (437 292 8204) OR Espaol: 1-855-Djelo-Ya (5-366-440-3474) o para ms informacin haga clic aqu or Text READY to 259-563 to register via text  Gabrielle Schultz - following are the goals we discussed in your visit today:  Timeframe:  Long-Range Goal Priority:  High Start Date:  03/19/22                           Expected End Date: ongoing                      Follow Up Date 05/20/22   - practice safe sex - schedule appointment for vaccines needed due to my age or health - schedule recommended health tests (blood work, mammogram, colonoscopy, pap test) - schedule and keep appointment for  annual check-up    Why is this important?   Screening tests can find diseases early when they are easier to treat.  Your doctor or nurse will talk with you about which tests are important for you.  Getting shots for common diseases like the flu and shingles will help prevent them.  04/20/22:  patient has appt with [facility] 4/19-to have cardiac cath   Patient verbalizes understanding of instructions and care plan provided today and agrees to view in MyChart. Active MyChart status and patient understanding of how to access instructions and care plan via MyChart confirmed with patient.     The Managed Medicaid care management team will reach out to the patient again over the next 30 business  days.  The  Patient  has been provided with contact information for the Managed Medicaid care management team and has been advised to call with any health related questions or concerns.   [name], BSN [place]  [facility] Care Management Coordinator - Managed [place] High Risk [phone]   Following is a copy of your plan of care:  Care Plan : RN Care Manager Plan of Care  Updates made by [name], RN since 04/20/2022 12:00 AM     Problem: Health Promotion or Disease Self-Management (General Plan of Care)      Long-Range Goal: Chronic Disease Management   Start  Date: 03/19/2022  Expected End Date: 06/19/2022  Priority: High  Note:   Current Barriers:  Knowledge Deficits related to plan of care for management of LVA, stroke, MI, migraines, HTN, TIA, AVD, asthma, rhinitis, COPD, sinusitis, dermatitis, CKD, tobacco use, HLD  Chronic Disease Management support and education needs related to LVA, stroke, MI, migraines, HTN, TIA, AVD, asthma, rhinitis, COPD, sinusitis, dermatitis, CKD, tobacco use, HLD  04/20/22:  No complaints today, BP stable.  To see [facility] this week-needs cardiac cath prior to aortic valve replacement.  Patient to follow up on dental providers,  RNCM  Clinical Goal(s):  Patient will verbalize understanding of plan for management of LVA, stroke, MI, migraines, HTN, TIA, AVD, asthma, rhinitis, COPD, sinusitis, dermatitis, CKD, tobacco use, HLD as evidenced by patient report verbalize basic understanding of LVA, stroke, MI, migraines, HTN, TIA, AVD, asthma, rhinitis, COPD, sinusitis, dermatitis, CKD, tobacco use, HLD   disease process and self health management plan as evidenced by patient report take all medications exactly as prescribed and will call provider for medication related questions as evidenced by patient report demonstrate understanding of rationale for each prescribed medication as evidenced by patient report attend all scheduled medical appointments as evidenced by patient report and EMR review demonstrate ongoing  adherence to prescribed treatment plan for LVA, stroke, MI, migraines, HTN, TIA, AVD, asthma, rhinitis, COPD, sinusitis, dermatitis, CKD, tobacco use, HLD    as evidenced by patient report and EMR review continue to work with RN Care Manager to address care management and care coordination needs related to LVA, stroke, MI, migraines, HTN, TIA, AVD, asthma, rhinitis, COPD, sinusitis, dermatitis, CKD, tobacco use, HLD  as evidenced by adherence to CM Team Scheduled appointments through collaboration with RN Care manager, provider, and care team.   Interventions: Inter-disciplinary care team collaboration (see longitudinal plan of care) Evaluation of current treatment plan related to  self management and patient's adherence to plan as established by provider  Smoking Cessation Interventions:  (Status:  New goal.) Long Term Goal Reviewed smoking history:   currently smoking less than 1ppd ppd-patient has no desire to quit smoking Evaluation of current treatment plan reviewed Reviewed scheduled/upcoming provider appointments  Discussed plans with patient for ongoing care management follow up and provided patient with direct  contact information for care management team Assessed social determinant of health barriers  Asthma: (Status:New goal.) Long Term Goal Discussed the importance of adequate rest and management of fatigue with Asthma Assessed social determinant of health barriers    Chronic Kidney Disease Interventions:  (Status:  New goal.) Long Term Goal Evaluation of current treatment plan related to chronic kidney disease self management and patient's adherence to plan as established by provider      Reviewed prescribed diet Reviewed medications with patient and discussed importance of compliance    Reviewed scheduled/upcoming provider appointments including    Discussed plans with patient for ongoing care management follow up and provided patient with direct contact information for care management team    Assessed social determinant of health barriers    Last practice recorded BP readings:  BP Readings from Last 3 Encounters:  03/17/22 (!) 141/63  03/01/22 136/72  03/01/22 133/71  Most recent eGFR/CrCl:  Lab Results  Component Value Date   EGFR 39 (L) 11/19/2021    No components found for: "CRCL"  COPD Interventions:  (Status:  New goal.) Long Term Goal Discussed the importance of adequate rest and management of fatigue with COPD Assessed social determinant of health barriers  Hyperlipidemia Interventions:  (Status:  New goal.) Long Term Goal Medication review performed; medication list updated in electronic medical record.  Provider established cholesterol goals reviewed Counseled on importance of regular laboratory monitoring as prescribed Reviewed importance of limiting foods high in cholesterol Assessed social determinant of health barriers   Hypertension Interventions:  (Status:  New goal.) Long Term Goal Last practice recorded BP readings:  BP Readings from Last 3 Encounters:  03/17/22 (!) 141/63  03/01/22 136/72  03/01/22 133/71  03/22/22         114/66 Most recent eGFR/CrCl:   Lab Results  Component Value Date   EGFR 39 (L) 11/19/2021    No components found for: "CRCL"  Evaluation of current treatment plan related to hypertension self management and patient's adherence to plan as established by provider Reviewed medications with patient and discussed importance of compliance Discussed plans with patient for ongoing care management follow up and provided patient with direct contact information for care management team Reviewed scheduled/upcoming provider appointments including:  Discussed complications of poorly controlled blood pressure such as heart disease, stroke, circulatory complications, vision complications, kidney impairment, sexual dysfunction Assessed social determinant of health barriers  Patient Goals/Self-Care Activities: Take all medications as prescribed Attend all scheduled provider appointments Call pharmacy for medication refills 3-7 days in advance of running out of medications Perform all self care activities independently  Perform IADL's (shopping, preparing meals, housekeeping, managing finances) independently Call provider office for new concerns or questions   Follow Up Plan:  The patient has been provided with contact information for the care management team and has been advised to call with any health related questions or concerns.  The care management team will reach out to the patient again over the next 30 business  days.

## 2022-04-20 NOTE — Patient Outreach (Signed)
Medicaid Managed Care   Nurse Care Manager Note  04/20/2022 Name:  ALLA SLOMA MRN:  [id number] DOB:  [birth year]  Gabrielle Schultz is an 52 y.o. year old female who is a primary patient of Sallyanne Kuster, NP.  The [facility] Managed Care Coordination team was consulted for assistance with:    Chronic healthcare management needs, LVA, gout, h/o stroke, MI, migraines, HTN, TIA, AVD, asthma, rhinitis, COPD, sinusitis, dermatitis, CKD, tobacco use, HLD  Ms. Nevers was given information about Medicaid Managed Care Coordination team services today. Gabrielle Schultz Patient agreed to services and verbal consent obtained.  Engaged with patient by telephone for follow up visit in response to provider referral for case management and/or care coordination services.   Assessments/Interventions:  Review of past medical history, allergies, medications, health status, including review of consultants reports, laboratory and other test data, was performed as part of comprehensive evaluation and provision of chronic care management services.  SDOH (Social Determinants of Health) assessments and interventions performed: SDOH Interventions    Flowsheet Row Patient Outreach Telephone from 04/20/2022 in [place] [facility] Office Visit from 05/22/2020 in [facility]  SDOH Interventions    Food Insecurity Interventions -- Other (Comment)  [food bank and husband's income]  Transportation Interventions -- Intervention Not Indicated  Utilities Interventions Intervention Not Indicated --  Stress Interventions Intervention Not Indicated --     Care Plan  Allergies  Allergen Reactions   Coconut (Cocos Nucifera) Hives and Swelling    Just coconut ("all coconut")   Amoxicillin Other (See Comments)    "makes me sick" "swelled up everywhere" Has patient had a PCN reaction causing immediate rash, facial/tongue/throat swelling, SOB or lightheadedness with hypotension: No Has  patient had a PCN reaction causing severe rash involving mucus membranes or skin necrosis: No Has patient had a PCN reaction that required hospitalization: No Has patient had a PCN reaction occurring within the last 10 years: No If all of the above answers are "NO", then may proceed with Cephalosporin use.     Penicillins Other (See Comments)    "makes me sick" "swelled up oil" Has patient had a PCN reaction causing immediate rash, facial/tongue/throat swelling, SOB or lightheadedness with hypotension: No Has patient had a PCN reaction causing severe rash involving mucus membranes or skin necrosis: No Has patient had a PCN reaction that required hospitalization: No Has patient had a PCN reaction occurring within the last 10 years: Yes If all of the above answers are "NO", then may proceed with Cephalosporin use.    Singulair [Montelukast Sodium] Rash    "made heart race"   Medications Reviewed Today     Reviewed by [name], RN (Registered Nurse) on 04/20/22 at 1043  Med List Status: <None>   Medication Order Taking? Sig Documenting Provider Last Dose Status Informant  acetaminophen (TYLENOL) 650 MG CR tablet [id number] No Take 650-1,300 mg by mouth every 8 (eight) hours as needed for pain. [provider] Taking Active Self  albuterol (PROVENTIL HFA) 108 (90 Base) MCG/ACT inhaler [id number]  Inhale 2 puffs into the lungs once every 4 (four) hours as needed for wheezing (cough). Sallyanne Kuster, NP  Active   allopurinol (ZYLOPRIM) 100 MG tablet [id number]  TAKE ONE TABLET BY MOUTH ONCE DAILY. Sallyanne Kuster, NP  Active   amLODipine (NORVASC) 2.5 MG tablet [id number] No Take 1 tablet (2.5 mg total) by mouth daily. Furth, Cadence H, PA-C Taking Active Self  aspirin EC 81  MG tablet [id number]  Take 1 tablet (81 mg total) by mouth once daily. Swallow whole. Sallyanne Kuster, NP  Active   atorvastatin (LIPITOR) 80 MG tablet [id number] No Take 1 tablet (80 mg total) by mouth  daily.  Patient taking differently: Take 80 mg by mouth at bedtime.   Furth, Cadence H, PA-C Taking Active Self  cetirizine (ZYRTEC) 10 MG tablet [id number]  TAKE ONE TABLET BY MOUTH ONCE DAILY. Sallyanne Kuster, NP  Active   cloNIDine (CATAPRES) 0.3 MG tablet [id number] No Take 1 tablet by mouth 3 times daily (morning, noon, and evening). Fransico Michael, Cadence H, PA-C Taking Active Self  clopidogrel (PLAVIX) 75 MG tablet [id number] No Take 1 tablet (75 mg total) by mouth once daily. Fransico Michael, Cadence H, PA-C Taking Active Self  docusate sodium (COLACE) 100 MG capsule [id number] No Take 100 mg by mouth in the morning. [provider] Taking Active Self  fluticasone (FLONASE) 50 MCG/ACT nasal spray [id number] No USE 1 SPRAY INTO BOTH NOSTRILS 2 TIMES A DAY  Patient taking differently: Place 1 spray into both nostrils at bedtime. USE 1 SPRAY INTO BOTH NOSTRILS 2 TIMES A DAY   Wieting, Richard, MD Taking Active Self  fluticasone-salmeterol [facility]) 250-50 MCG/ACT AEPB [id number]  Inhale one puff into the lungs in the morning and at bedtime Sallyanne Kuster, NP  Active   furosemide (LASIX) 20 MG tablet [id number] No Take 1 tablet (20 mg total) by mouth daily as needed for fluid or edema.  Patient taking differently: Take 20 mg by mouth in the morning.   Furth, Cadence H, PA-C Taking Active Self  losartan (COZAAR) 50 MG tablet [id number] No Take 1 tablet (50 mg total) by mouth daily.  Patient taking differently: Take 25 mg by mouth in the morning.   Fransico Michael, Cadence H, PA-C Taking Active Self           Med Note Tiburcio Pea, Luvenia Redden   Fri Mar 12, 2022  1:48 PM)    omeprazole (PRILOSEC) 20 MG capsule [id number]  Take 1 capsule (20 mg total) by mouth daily before breakfast. Sallyanne Kuster, NP  Active   pantoprazole (PROTONIX) 40 MG tablet [id number]  Take 1 tablet (40 mg total) by mouth daily. Sallyanne Kuster, NP  Active   potassium chloride (KLOR-CON M) 10 MEQ tablet [id number]  Take 1 tablet (10 mEq  total) by mouth daily. To take whenever you take lasix Sallyanne Kuster, NP  Active   promethazine (PHENERGAN) 25 MG tablet [id number]  Take 1 tablet (25 mg total) by mouth as needed for nausea or vomiting. 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) 12/03/2019   Cryptogenic stroke    Hyperlipidemia LDL goal <70    AKI (acute kidney injury)    Obesity    MI (myocardial infarction)    Migraines    Stroke (cerebrum) 11/30/2019   Benign essential hypertension 12/23/2018   Edema of lower  extremity 12/23/2018   Recurrent urinary tract infection 12/23/2018   Edema of both feet 09/23/2018   Urine frequency 10/06/2017   SIRS (systemic inflammatory response syndrome) 01/18/2017   Abdominal pain 01/18/2017   Aortic regurgitation 09/08/2016   Chest pain 08/28/2016   Hypertensive emergency 07/31/2016   AMA (advanced maternal age) multigravida 35+, third trimester 07/30/2016   Asthma complicating pregnancy, antepartum 07/30/2016   Chronic hypertension affecting pregnancy 07/30/2016   H/O myocardial ischemia 07/30/2016   H/O: C-section 07/30/2016   H/O: stroke 07/30/2016   IUGR (intrauterine growth restriction) affecting care of mother, second trimester, fetus 1 07/30/2016   Supervision of high risk pregnancy in third trimester 07/30/2016   Edema of both feet 04/22/2016   Abnormal TSH 04/22/2016   Abdominal pain, right upper  quadrant 12/02/2015   Dermatitis 12/02/2015   Urinary tract infection 10/28/2015   Low HDL (under 40) 06/26/2015   Allergic rhinitis 06/26/2015   Smoker 06/26/2015   Cerebral infarct 11/21/2013   Chronic kidney disease 11/21/2013   LVH (left ventricular hypertrophy) 08/12/2011   Chronic renal insufficiency 08/12/2011   Conditions to be addressed/monitored per PCP order:  Chronic healthcare management needs, LVA, gout, h/o stroke, MI, migraines, HTN, TIA, AVD, asthma, rhinitis, COPD, sinusitis, dermatitis, CKD, tobacco use, HLD  Care Plan : RN Care Manager Plan of Care  Updates made by [name], RN since 04/20/2022 12:00 AM     Problem: Health Promotion or Disease Self-Management (General Plan of Care)      Long-Range Goal: Chronic Disease Management   Start Date: 03/19/2022  Expected End Date: 06/19/2022  Priority: High  Note:   Current Barriers:  Knowledge Deficits related to plan of care for management of LVA, stroke, MI, migraines, HTN, TIA, AVD, asthma, rhinitis, COPD, sinusitis, dermatitis, CKD, tobacco use, HLD  Chronic Disease Management support and education needs related to LVA, stroke, MI, migraines, HTN, TIA, AVD, asthma, rhinitis, COPD, sinusitis, dermatitis, CKD, tobacco use, HLD  04/20/22:  No complaints today, BP stable.  To see [facility] this week-needs cardiac cath prior to aortic valve replacement.  Patient to follow up on dental providers,  RNCM Clinical Goal(s):  Patient will verbalize understanding of plan for management of LVA, stroke, MI, migraines, HTN, TIA, AVD, asthma, rhinitis, COPD, sinusitis, dermatitis, CKD, tobacco use, HLD as evidenced by patient report verbalize basic understanding of LVA, stroke, MI, migraines, HTN, TIA, AVD, asthma, rhinitis, COPD, sinusitis, dermatitis, CKD, tobacco use, HLD   disease process and self health management plan as evidenced by patient report take all medications exactly as prescribed and will call provider for medication  related questions as evidenced by patient report demonstrate understanding of rationale for each prescribed medication as evidenced by patient report attend all scheduled medical appointments as evidenced by patient report and EMR review demonstrate ongoing  adherence to prescribed treatment plan for LVA, stroke, MI, migraines, HTN, TIA, AVD, asthma, rhinitis, COPD, sinusitis, dermatitis, CKD, tobacco use, HLD    as evidenced by patient report and EMR review continue to work with RN Care Manager to address care management and care coordination needs related to LVA, stroke, MI, migraines, HTN, TIA, AVD, asthma, rhinitis, COPD, sinusitis, dermatitis, CKD, tobacco use, HLD  as evidenced by adherence to CM Team Scheduled appointments through collaboration with RN Care manager, provider, and care team.   Interventions: Inter-disciplinary care team collaboration (see longitudinal plan of care) Evaluation of current treatment plan related to  self management and patient's adherence to plan as established by  provider  Smoking Cessation Interventions:  (Status:  New goal.) Long Term Goal Reviewed smoking history:   currently smoking less than 1ppd ppd-patient has no desire to quit smoking Evaluation of current treatment plan reviewed Reviewed scheduled/upcoming provider appointments  Discussed plans with patient for ongoing care management follow up and provided patient with direct contact information for care management team Assessed social determinant of health barriers  Asthma: (Status:New goal.) Long Term Goal Discussed the importance of adequate rest and management of fatigue with Asthma Assessed social determinant of health barriers    Chronic Kidney Disease Interventions:  (Status:  New goal.) Long Term Goal Evaluation of current treatment plan related to chronic kidney disease self management and patient's adherence to plan as established by provider      Reviewed prescribed diet Reviewed  medications with patient and discussed importance of compliance    Reviewed scheduled/upcoming provider appointments including    Discussed plans with patient for ongoing care management follow up and provided patient with direct contact information for care management team    Assessed social determinant of health barriers    Last practice recorded BP readings:  BP Readings from Last 3 Encounters:  03/17/22 (!) 141/63  03/01/22 136/72  03/01/22 133/71  Most recent eGFR/CrCl:  Lab Results  Component Value Date   EGFR 39 (L) 11/19/2021    No components found for: "CRCL"  COPD Interventions:  (Status:  New goal.) Long Term Goal Discussed the importance of adequate rest and management of fatigue with COPD Assessed social determinant of health barriers  Hyperlipidemia Interventions:  (Status:  New goal.) Long Term Goal Medication review performed; medication list updated in electronic medical record.  Provider established cholesterol goals reviewed Counseled on importance of regular laboratory monitoring as prescribed Reviewed importance of limiting foods high in cholesterol Assessed social determinant of health barriers   Hypertension Interventions:  (Status:  New goal.) Long Term Goal Last practice recorded BP readings:  BP Readings from Last 3 Encounters:  03/17/22 (!) 141/63  03/01/22 136/72  03/01/22 133/71  03/22/22         114/66 Most recent eGFR/CrCl:  Lab Results  Component Value Date   EGFR 39 (L) 11/19/2021    No components found for: "CRCL"  Evaluation of current treatment plan related to hypertension self management and patient's adherence to plan as established by provider Reviewed medications with patient and discussed importance of compliance Discussed plans with patient for ongoing care management follow up and provided patient with direct contact information for care management team Reviewed scheduled/upcoming provider appointments including:  Discussed  complications of poorly controlled blood pressure such as heart disease, stroke, circulatory complications, vision complications, kidney impairment, sexual dysfunction Assessed social determinant of health barriers  Patient Goals/Self-Care Activities: Take all medications as prescribed Attend all scheduled provider appointments Call pharmacy for medication refills 3-7 days in advance of running out of medications Perform all self care activities independently  Perform IADL's (shopping, preparing meals, housekeeping, managing finances) independently Call provider office for new concerns or questions   Follow Up Plan:  The patient has been provided with contact information for the care management team and has been advised to call with any health related questions or concerns.  The care management team will reach out to the patient again over the next 30 business  days.    Long-Range Goal: Establish Plan of Care for Chronic Disease Management Needs   Priority: High  Note:   Timeframe:  Long-Range Goal Priority:  High Start Date:  03/19/22                           Expected End Date: ongoing                      Follow Up Date 05/20/22   - practice safe sex - schedule appointment for vaccines needed due to my age or health - schedule recommended health tests (blood work, mammogram, colonoscopy, pap test) - schedule and keep appointment for annual check-up    Why is this important?   Screening tests can find diseases early when they are easier to treat.  Your doctor or nurse will talk with you about which tests are important for you.  Getting shots for common diseases like the flu and shingles will help prevent them.  04/20/22:  patient has appt with [facility] 4/19-to have cardiac cath    Follow Up:  Patient agrees to Care Plan and Follow-up.  Plan: The Managed Medicaid care management team will reach out to the patient again over the next 30 business  days. and The  Patient has been  provided with contact information for the Managed Medicaid care management team and has been advised to call with any health related questions or concerns.  Date/time of next scheduled RN care management/care coordination outreach:  05/20/22 at 230

## 2022-04-22 ENCOUNTER — Emergency Department (HOSPITAL_COMMUNITY): Payer: Medicaid Other

## 2022-04-22 ENCOUNTER — Inpatient Hospital Stay (HOSPITAL_COMMUNITY)
Admission: EM | Admit: 2022-04-22 | Discharge: 2022-04-24 | DRG: 062 | Disposition: A | Discharge status: Home / Self Care | Payer: Medicaid Other | Attending: Student in an Organized Health Care Education/Training Program | Admitting: Student in an Organized Health Care Education/Training Program

## 2022-04-22 DIAGNOSIS — I6359 Cerebral infarction due to unspecified occlusion or stenosis of other cerebral artery: Principal | ICD-10-CM | POA: Diagnosis present

## 2022-04-22 DIAGNOSIS — R0902 Hypoxemia: Secondary | ICD-10-CM | POA: Diagnosis not present

## 2022-04-22 DIAGNOSIS — R29818 Other symptoms and signs involving the nervous system: Secondary | ICD-10-CM | POA: Diagnosis not present

## 2022-04-22 DIAGNOSIS — Z7902 Long term (current) use of antithrombotics/antiplatelets: Secondary | ICD-10-CM

## 2022-04-22 DIAGNOSIS — G43109 Migraine with aura, not intractable, without status migrainosus: Secondary | ICD-10-CM | POA: Diagnosis not present

## 2022-04-22 DIAGNOSIS — Z833 Family history of diabetes mellitus: Secondary | ICD-10-CM

## 2022-04-22 DIAGNOSIS — R29704 NIHSS score 4: Secondary | ICD-10-CM | POA: Diagnosis present

## 2022-04-22 DIAGNOSIS — M109 Gout, unspecified: Secondary | ICD-10-CM | POA: Diagnosis present

## 2022-04-22 DIAGNOSIS — Z79899 Other long term (current) drug therapy: Secondary | ICD-10-CM

## 2022-04-22 DIAGNOSIS — F17200 Nicotine dependence, unspecified, uncomplicated: Secondary | ICD-10-CM | POA: Diagnosis present

## 2022-04-22 DIAGNOSIS — I2510 Atherosclerotic heart disease of native coronary artery without angina pectoris: Secondary | ICD-10-CM | POA: Diagnosis not present

## 2022-04-22 DIAGNOSIS — R2990 Unspecified symptoms and signs involving the nervous system: Secondary | ICD-10-CM | POA: Diagnosis not present

## 2022-04-22 DIAGNOSIS — Z825 Family history of asthma and other chronic lower respiratory diseases: Secondary | ICD-10-CM

## 2022-04-22 DIAGNOSIS — E785 Hyperlipidemia, unspecified: Secondary | ICD-10-CM | POA: Diagnosis present

## 2022-04-22 DIAGNOSIS — I129 Hypertensive chronic kidney disease with stage 1 through stage 4 chronic kidney disease, or unspecified chronic kidney disease: Secondary | ICD-10-CM | POA: Diagnosis not present

## 2022-04-22 DIAGNOSIS — R29810 Facial weakness: Secondary | ICD-10-CM | POA: Diagnosis not present

## 2022-04-22 DIAGNOSIS — Z8249 Family history of ischemic heart disease and other diseases of the circulatory system: Secondary | ICD-10-CM

## 2022-04-22 DIAGNOSIS — Z83438 Family history of other disorder of lipoprotein metabolism and other lipidemia: Secondary | ICD-10-CM

## 2022-04-22 DIAGNOSIS — Z880 Allergy status to penicillin: Secondary | ICD-10-CM

## 2022-04-22 DIAGNOSIS — Z841 Family history of disorders of kidney and ureter: Secondary | ICD-10-CM

## 2022-04-22 DIAGNOSIS — I252 Old myocardial infarction: Secondary | ICD-10-CM

## 2022-04-22 DIAGNOSIS — I160 Hypertensive urgency: Secondary | ICD-10-CM | POA: Diagnosis not present

## 2022-04-22 DIAGNOSIS — R739 Hyperglycemia, unspecified: Secondary | ICD-10-CM | POA: Diagnosis not present

## 2022-04-22 DIAGNOSIS — Z888 Allergy status to other drugs, medicaments and biological substances status: Secondary | ICD-10-CM

## 2022-04-22 DIAGNOSIS — E876 Hypokalemia: Secondary | ICD-10-CM | POA: Diagnosis present

## 2022-04-22 DIAGNOSIS — Z597 Insufficient social insurance and welfare support: Secondary | ICD-10-CM

## 2022-04-22 DIAGNOSIS — N179 Acute kidney failure, unspecified: Secondary | ICD-10-CM | POA: Diagnosis present

## 2022-04-22 DIAGNOSIS — I161 Hypertensive emergency: Secondary | ICD-10-CM | POA: Diagnosis not present

## 2022-04-22 DIAGNOSIS — K219 Gastro-esophageal reflux disease without esophagitis: Secondary | ICD-10-CM | POA: Diagnosis present

## 2022-04-22 DIAGNOSIS — F17210 Nicotine dependence, cigarettes, uncomplicated: Secondary | ICD-10-CM | POA: Diagnosis not present

## 2022-04-22 DIAGNOSIS — G8104 Flaccid hemiplegia affecting left nondominant side: Secondary | ICD-10-CM | POA: Diagnosis not present

## 2022-04-22 DIAGNOSIS — N189 Chronic kidney disease, unspecified: Secondary | ICD-10-CM | POA: Diagnosis present

## 2022-04-22 DIAGNOSIS — R0689 Other abnormalities of breathing: Secondary | ICD-10-CM | POA: Diagnosis not present

## 2022-04-22 DIAGNOSIS — Z7982 Long term (current) use of aspirin: Secondary | ICD-10-CM

## 2022-04-22 DIAGNOSIS — R4781 Slurred speech: Secondary | ICD-10-CM | POA: Diagnosis not present

## 2022-04-22 DIAGNOSIS — M3211 Endocarditis in systemic lupus erythematosus: Secondary | ICD-10-CM | POA: Diagnosis not present

## 2022-04-22 DIAGNOSIS — R471 Dysarthria and anarthria: Secondary | ICD-10-CM | POA: Diagnosis not present

## 2022-04-22 DIAGNOSIS — I639 Cerebral infarction, unspecified: Secondary | ICD-10-CM | POA: Diagnosis present

## 2022-04-22 DIAGNOSIS — E669 Obesity, unspecified: Secondary | ICD-10-CM | POA: Diagnosis not present

## 2022-04-22 DIAGNOSIS — Z8673 Personal history of transient ischemic attack (TIA), and cerebral infarction without residual deficits: Secondary | ICD-10-CM

## 2022-04-22 DIAGNOSIS — G319 Degenerative disease of nervous system, unspecified: Secondary | ICD-10-CM | POA: Diagnosis not present

## 2022-04-22 DIAGNOSIS — R000 Tachycardia, unspecified: Secondary | ICD-10-CM | POA: Diagnosis not present

## 2022-04-22 DIAGNOSIS — Z6832 Body mass index (BMI) 32.0-32.9, adult: Secondary | ICD-10-CM

## 2022-04-22 DIAGNOSIS — I6523 Occlusion and stenosis of bilateral carotid arteries: Secondary | ICD-10-CM | POA: Diagnosis not present

## 2022-04-22 DIAGNOSIS — I10 Essential (primary) hypertension: Secondary | ICD-10-CM | POA: Diagnosis not present

## 2022-04-22 DIAGNOSIS — G459 Transient cerebral ischemic attack, unspecified: Secondary | ICD-10-CM

## 2022-04-22 DIAGNOSIS — N184 Chronic kidney disease, stage 4 (severe): Secondary | ICD-10-CM | POA: Diagnosis present

## 2022-04-22 DIAGNOSIS — H53149 Visual discomfort, unspecified: Secondary | ICD-10-CM | POA: Diagnosis present

## 2022-04-22 DIAGNOSIS — D631 Anemia in chronic kidney disease: Secondary | ICD-10-CM | POA: Diagnosis present

## 2022-04-22 DIAGNOSIS — Z91018 Allergy to other foods: Secondary | ICD-10-CM

## 2022-04-22 LAB — DIFFERENTIAL
Abs Immature Granulocytes: 0.02 10*3/uL (ref 0.00–0.07)
Basophils Absolute: 0.1 10*3/uL (ref 0.0–0.1)
Basophils Relative: 1 %
Eosinophils Absolute: 0.7 10*3/uL — ABNORMAL HIGH (ref 0.0–0.5)
Eosinophils Relative: 7 %
Immature Granulocytes: 0 %
Lymphocytes Relative: 36 %
Lymphs Abs: 3.6 10*3/uL (ref 0.7–4.0)
Monocytes Absolute: 0.6 10*3/uL (ref 0.1–1.0)
Monocytes Relative: 6 %
Neutro Abs: 5 10*3/uL (ref 1.7–7.7)
Neutrophils Relative %: 50 %

## 2022-04-22 LAB — ETHANOL: Alcohol, Ethyl (B): <10 mg/dL (ref <10)

## 2022-04-22 LAB — CBC
HCT: 34.8 % — ABNORMAL LOW (ref 36.0–46.0)
Hemoglobin: 11.8 g/dL — ABNORMAL LOW (ref 12.0–15.0)
MCH: 35.6 pg — ABNORMAL HIGH (ref 26.0–34.0)
MCHC: 33.9 g/dL (ref 30.0–36.0)
MCV: 105.1 fL — ABNORMAL HIGH (ref 80.0–100.0)
Platelets: 220 10*3/uL (ref 150–400)
RBC: 3.31 MIL/uL — ABNORMAL LOW (ref 3.87–5.11)
RDW: 18.6 % — ABNORMAL HIGH (ref 11.5–15.5)
WBC: 9.9 10*3/uL (ref 4.0–10.5)
nRBC: 0 % (ref 0.0–0.2)

## 2022-04-22 LAB — COMPREHENSIVE METABOLIC PANEL
ALT: 15 U/L (ref 0–44)
AST: 22 U/L (ref 15–41)
Albumin: 3.7 g/dL (ref 3.5–5.0)
Alkaline Phosphatase: 125 U/L (ref 38–126)
Anion gap: 14 (ref 5–15)
BUN: 32 mg/dL — ABNORMAL HIGH (ref 6–20)
CO2: 19 mmol/L — ABNORMAL LOW (ref 22–32)
Calcium: 9.3 mg/dL (ref 8.9–10.3)
Chloride: 107 mmol/L (ref 98–111)
Creatinine, Ser: 2.23 mg/dL — ABNORMAL HIGH (ref 0.44–1.00)
GFR, Estimated: 26 mL/min — ABNORMAL LOW (ref >60)
Glucose, Bld: 103 mg/dL — ABNORMAL HIGH (ref 70–99)
Potassium: 5.2 mmol/L — ABNORMAL HIGH (ref 3.5–5.1)
Sodium: 140 mmol/L (ref 135–145)
Total Bilirubin: 0.3 mg/dL (ref 0.3–1.2)
Total Protein: 6.8 g/dL (ref 6.5–8.1)

## 2022-04-22 LAB — I-STAT CHEM 8, ED
BUN: 34 mg/dL — ABNORMAL HIGH (ref 6–20)
Calcium, Ion: 1.2 mmol/L (ref 1.15–1.40)
Chloride: 111 mmol/L (ref 98–111)
Creatinine, Ser: 2.3 mg/dL — ABNORMAL HIGH (ref 0.44–1.00)
Glucose, Bld: 95 mg/dL (ref 70–99)
HCT: 38 % (ref 36.0–46.0)
Hemoglobin: 12.9 g/dL (ref 12.0–15.0)
Potassium: 5.2 mmol/L — ABNORMAL HIGH (ref 3.5–5.1)
Sodium: 142 mmol/L (ref 135–145)
TCO2: 23 mmol/L (ref 22–32)

## 2022-04-22 LAB — APTT: aPTT: 28 seconds (ref 24–36)

## 2022-04-22 LAB — PROTIME-INR
INR: 1 (ref 0.8–1.2)
Prothrombin Time: 13.3 seconds (ref 11.4–15.2)

## 2022-04-22 MED ORDER — STROKE: EARLY STAGES OF RECOVERY BOOK
Freq: Once | Status: DC
  Filled 2022-04-22: qty 1

## 2022-04-22 MED ORDER — ACETAMINOPHEN 325 MG PO TABS
650.0000 mg | ORAL_TABLET | ORAL | Status: DC | PRN
  Administered 2022-04-23: 650 mg via ORAL
  Filled 2022-04-22: qty 2

## 2022-04-22 MED ORDER — ACETAMINOPHEN 160 MG/5ML PO SOLN: 650.0000 mg | ORAL | Status: DC | PRN

## 2022-04-22 MED ORDER — SODIUM CHLORIDE 0.9 % IV SOLN: INTRAVENOUS | Status: DC

## 2022-04-22 MED ORDER — TENECTEPLASE FOR STROKE
0.2500 mg/kg | PACK | Freq: Once | INTRAVENOUS | Status: AC
  Administered 2022-04-22: 20 mg via INTRAVENOUS
  Filled 2022-04-22: qty 10

## 2022-04-22 MED ORDER — PANTOPRAZOLE SODIUM 40 MG IV SOLR
40.0000 mg | Freq: Every day | INTRAVENOUS | Status: DC
  Administered 2022-04-22: 40 mg via INTRAVENOUS
  Filled 2022-04-22: qty 10

## 2022-04-22 MED ORDER — LABETALOL HCL 5 MG/ML IV SOLN
10.0000 mg | INTRAVENOUS | Status: DC | PRN
  Administered 2022-04-23 – 2022-04-24 (×5): 10 mg via INTRAVENOUS
  Filled 2022-04-22 (×5): qty 4

## 2022-04-22 MED ORDER — SENNOSIDES-DOCUSATE SODIUM 8.6-50 MG PO TABS: 1.0000 | ORAL_TABLET | Freq: Every evening | ORAL | Status: DC | PRN

## 2022-04-22 MED ORDER — ACETAMINOPHEN 650 MG RE SUPP: 650.0000 mg | RECTAL | Status: DC | PRN

## 2022-04-22 MED ORDER — CLEVIDIPINE BUTYRATE 0.5 MG/ML IV EMUL
0.0000 mg/h | INTRAVENOUS | Status: DC
  Administered 2022-04-22: 2 mg/h via INTRAVENOUS
  Filled 2022-04-22: qty 100

## 2022-04-22 MED ORDER — IOHEXOL 350 MG/ML SOLN
75.0000 mL | Freq: Once | INTRAVENOUS | Status: AC | PRN
  Administered 2022-04-22: 75 mL via INTRAVENOUS

## 2022-04-22 NOTE — Code Documentation (Signed)
Stroke Response Nurse Documentation Code Documentation  Gabrielle Schultz is a 52 y.o. female arriving to [facility]  via [facility] on 4/18 with past medical hx of CAD, HTN, multiple prior strokes and renal insufficiency. On clopidogrel 75 mg daily. Code stroke was activated by EMS.   Patient from home where she was LKW at 2120 and now complaining of left sided weakness and left sided facial droop.  Stroke team at the bedside on patient arrival. Labs drawn and patient cleared for CT by Dr. Lockie Mola. Patient to CT with team. NIHSS 4, see documentation for details and code stroke times. Patient with left facial droop, left decreased sensation, and dysarthria  on exam. The following imaging was completed:  CT Head and CTA. Patient is a candidate for IV Thrombolytic due to fixed neurological deficit. Patient is not a candidate for IR due to No LVO.   Care Plan: TNKase.   Bedside handoff with ED RN Marcello Moores.    Rose Fillers  Rapid Response RN

## 2022-04-22 NOTE — ED Triage Notes (Signed)
Patient BIB EMS from home due to speech issue at 9:20. EMS reports R facial droop and L arm paralysis. Upon arrival patient L facial droop. Patient A&Ox4.

## 2022-04-22 NOTE — Progress Notes (Signed)
PHARMACIST CODE STROKE RESPONSE  Notified to mix TNK at 2235 by Dr. Wilford Corner TNK preparation completed at 2236  TNK dose = 20 mg IV over 5 seconds.   Issues/delays encountered (if applicable): N/A   Cherylin Mylar, PharmD PGY1 Pharmacy Resident 4/18/202410:40 PM

## 2022-04-22 NOTE — ED Provider Notes (Signed)
[place] EMERGENCY DEPARTMENT AT [facility] Provider Note   CSN: [id number] Arrival date & time: 04/22/22  2225  An emergency department physician performed an initial assessment on this suspected stroke patient at 2225.  History  Chief Complaint  Patient presents with   Neurologic Problem    IRIS HAIRSTON is a 52 y.o. female.  Patient arrives as a code stroke.  About an hour ago she developed left-sided numbness, difficulty with speech and left-sided weakness.  History of hypertension, stroke on Plavix.  History of MI.  Denies any chest pain or shortness of breath.  Was in her normal state of health prior to this.  Unremarkable vitals with EMS.  Normal blood sugar with EMS.  The history is provided by the patient.       Home Medications Prior to Admission medications   Medication Sig Start Date End Date Taking? Authorizing Provider  acetaminophen (TYLENOL) 650 MG CR tablet Take 650-1,300 mg by mouth every 8 (eight) hours as needed for pain.    [provider]  albuterol (PROVENTIL HFA) 108 (90 Base) MCG/ACT inhaler Inhale 2 puffs into the lungs once every 4 (four) hours as needed for wheezing (cough). 03/22/22   Sallyanne Kuster, NP  allopurinol (ZYLOPRIM) 100 MG tablet TAKE ONE TABLET BY MOUTH ONCE DAILY. 03/22/22   Sallyanne Kuster, NP  amLODipine (NORVASC) 2.5 MG tablet Take 1 tablet (2.5 mg total) by mouth daily. 02/05/22 05/06/22  Furth, Cadence H, PA-C  aspirin EC 81 MG tablet Take 1 tablet (81 mg total) by mouth once daily. Swallow whole. 03/22/22   Sallyanne Kuster, NP  atorvastatin (LIPITOR) 80 MG tablet Take 1 tablet (80 mg total) by mouth daily. Patient taking differently: Take 80 mg by mouth at bedtime. 02/05/22   Furth, Cadence H, PA-C  cetirizine (ZYRTEC) 10 MG tablet TAKE ONE TABLET BY MOUTH ONCE DAILY. 03/22/22   Sallyanne Kuster, NP  cloNIDine (CATAPRES) 0.3 MG tablet Take 1 tablet by mouth 3 times daily (morning, noon, and evening). 02/05/22    Furth, Cadence H, PA-C  clopidogrel (PLAVIX) 75 MG tablet Take 1 tablet (75 mg total) by mouth once daily. 02/05/22   Furth, Cadence H, PA-C  docusate sodium (COLACE) 100 MG capsule Take 100 mg by mouth in the morning.    [provider]  fluticasone (FLONASE) 50 MCG/ACT nasal spray USE 1 SPRAY INTO BOTH NOSTRILS 2 TIMES A DAY Patient taking differently: Place 1 spray into both nostrils at bedtime. USE 1 SPRAY INTO BOTH NOSTRILS 2 TIMES A DAY 01/10/22   Alford Highland, MD  fluticasone-salmeterol [facility]) 250-50 MCG/ACT AEPB Inhale one puff into the lungs in the morning and at bedtime 03/22/22   Sallyanne Kuster, NP  furosemide (LASIX) 20 MG tablet Take 1 tablet (20 mg total) by mouth daily as needed for fluid or edema. Patient taking differently: Take 20 mg by mouth in the morning. 02/05/22   Furth, Cadence H, PA-C  losartan (COZAAR) 50 MG tablet Take 1 tablet (50 mg total) by mouth daily. Patient taking differently: Take 25 mg by mouth in the morning. 02/05/22   Furth, Cadence H, PA-C  omeprazole (PRILOSEC) 20 MG capsule Take 1 capsule (20 mg total) by mouth daily before breakfast. 03/22/22   Sallyanne Kuster, NP  pantoprazole (PROTONIX) 40 MG tablet Take 1 tablet (40 mg total) by mouth daily. 03/22/22   Sallyanne Kuster, NP  potassium chloride (KLOR-CON M) 10 MEQ tablet Take 1 tablet (10 mEq total) by mouth  daily. To take whenever you take lasix 03/22/22   Sallyanne Kuster, NP  promethazine (PHENERGAN) 25 MG tablet Take 1 tablet (25 mg total) by mouth as needed for nausea or vomiting. 03/22/22   Sallyanne Kuster, NP  simvastatin (ZOCOR) 10 MG tablet TAKE ONE TABLET BY MOUTH EVERY DAY Patient not taking: Reported on 07/22/2020 05/22/20 07/22/20  Copland, Ilona Sorrel, PA-C      Allergies    Coconut (cocos nucifera), Amoxicillin, Penicillins, and Singulair [montelukast sodium]    Review of Systems   Review of Systems  Physical Exam Updated Vital Signs BP (!) 159/68   Pulse 87   Temp  97.9 F (36.6 C)   Resp 16   Wt 79 kg   LMP 08/18/2020 (Approximate) Comment: Tubal ligation  SpO2 100%   BMI 32.91 kg/m  Physical Exam Vitals and nursing note reviewed.  Constitutional:      General: She is not in acute distress.    Appearance: She is well-developed. She is not ill-appearing.  HENT:     Head: Normocephalic and atraumatic.  Eyes:     Extraocular Movements: Extraocular movements intact.     Conjunctiva/sclera: Conjunctivae normal.     Pupils: Pupils are equal, round, and reactive to light.  Cardiovascular:     Rate and Rhythm: Normal rate and regular rhythm.     Pulses: Normal pulses.     Heart sounds: Normal heart sounds. No murmur heard. Pulmonary:     Effort: Pulmonary effort is normal. No respiratory distress.     Breath sounds: Normal breath sounds.  Abdominal:     Palpations: Abdomen is soft.     Tenderness: There is no abdominal tenderness.  Musculoskeletal:        General: No swelling.     Cervical back: Neck supple.  Skin:    General: Skin is warm and dry.     Capillary Refill: Capillary refill takes less than 2 seconds.  Neurological:     Mental Status: She is alert.     Comments: She has left-sided weakness, left-sided numbness, dysarthria, normal strength and sensation on the right, no neglect, normal visual fields  Psychiatric:        Mood and Affect: Mood normal.     ED Results / Procedures / Treatments   Labs (all labs ordered are listed, but only abnormal results are displayed) Labs Reviewed  CBC - Abnormal; Notable for the following components:      Result Value   RBC 3.31 (*)    Hemoglobin 11.8 (*)    HCT 34.8 (*)    MCV 105.1 (*)    MCH 35.6 (*)    RDW 18.6 (*)    All other components within normal limits  DIFFERENTIAL - Abnormal; Notable for the following components:   Eosinophils Absolute 0.7 (*)    All other components within normal limits  COMPREHENSIVE METABOLIC PANEL - Abnormal; Notable for the following components:    Potassium 5.2 (*)    CO2 19 (*)    Glucose, Bld 103 (*)    BUN 32 (*)    Creatinine, Ser 2.23 (*)    GFR, Estimated 26 (*)    All other components within normal limits  I-STAT CHEM 8, ED - Abnormal; Notable for the following components:   Potassium 5.2 (*)    BUN 34 (*)    Creatinine, Ser 2.30 (*)    All other components within normal limits  ETHANOL  PROTIME-INR  APTT  RAPID URINE DRUG  SCREEN, HOSP PERFORMED  URINALYSIS, ROUTINE W REFLEX MICROSCOPIC  LIPID PANEL    EKG None  Radiology CT ANGIO HEAD NECK W WO CM (CODE STROKE)  Result Date: 04/22/2022 CLINICAL DATA:  Left-sided deficits EXAM: CT ANGIOGRAPHY HEAD AND NECK WITH AND WITHOUT CONTRAST TECHNIQUE: Multidetector CT imaging of the head and neck was performed using the standard protocol during bolus administration of intravenous contrast. Multiplanar CT image reconstructions and MIPs were obtained to evaluate the vascular anatomy. Carotid stenosis measurements (when applicable) are obtained utilizing NASCET criteria, using the distal internal carotid diameter as the denominator. RADIATION DOSE REDUCTION: This exam was performed according to the departmental dose-optimization program which includes automated exposure control, adjustment of the mA and/or kV according to patient size and/or use of iterative reconstruction technique. CONTRAST:  45mL OMNIPAQUE IOHEXOL 350 MG/ML SOLN COMPARISON:  None Available. FINDINGS: CTA NECK FINDINGS SKELETON: There is no bony spinal canal stenosis. No lytic or blastic lesion. OTHER NECK: Normal pharynx, larynx and major salivary glands. No cervical lymphadenopathy. Unremarkable thyroid gland. UPPER CHEST: No pneumothorax or pleural effusion. No nodules or masses. AORTIC ARCH: There is no calcific atherosclerosis of the aortic arch. There is no aneurysm, dissection or hemodynamically significant stenosis of the visualized portion of the aorta. Conventional 3 vessel aortic branching pattern. The  visualized proximal subclavian arteries are widely patent. RIGHT CAROTID SYSTEM: Normal without aneurysm, dissection or stenosis. LEFT CAROTID SYSTEM: Normal without aneurysm, dissection or stenosis. VERTEBRAL ARTERIES: Left dominant configuration. Both origins are clearly patent. There is no dissection, occlusion or flow-limiting stenosis to the skull base (V1-V3 segments). CTA HEAD FINDINGS POSTERIOR CIRCULATION: --Vertebral arteries: Normal V4 segments. --Inferior cerebellar arteries: Normal. --Basilar artery: Normal. --Superior cerebellar arteries: Normal. --Posterior cerebral arteries (PCA): Normal. ANTERIOR CIRCULATION: --Intracranial internal carotid arteries: Atherosclerotic calcification of the internal carotid arteries at the skull base without hemodynamically significant stenosis. --Anterior cerebral arteries (ACA): Normal. Both A1 segments are present. Patent anterior communicating artery (a-comm). --Middle cerebral arteries (MCA): Normal. VENOUS SINUSES: As permitted by contrast timing, patent. ANATOMIC VARIANTS: None Review of the MIP images confirms the above findings. IMPRESSION: No emergent large vessel occlusion or hemodynamically significant stenosis of the head or neck. Electronically Signed   By: Deatra Robinson M.D.   On: 04/22/2022 22:50   CT HEAD CODE STROKE WO CONTRAST  Result Date: 04/22/2022 CLINICAL DATA:  Code stroke.  Left-sided neurologic deficit EXAM: CT HEAD WITHOUT CONTRAST TECHNIQUE: Contiguous axial images were obtained from the base of the skull through the vertex without intravenous contrast. RADIATION DOSE REDUCTION: This exam was performed according to the departmental dose-optimization program which includes automated exposure control, adjustment of the mA and/or kV according to patient size and/or use of iterative reconstruction technique. COMPARISON:  01/09/2022 FINDINGS: Brain: There is no mass, hemorrhage or extra-axial collection. The size and configuration of the  ventricles and extra-axial CSF spaces are normal. 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 There is periventricular hypoattenuation compatible with chronic microvascular disease. Vascular: No abnormal hyperdensity of the major intracranial arteries or dural venous sinuses. No intracranial atherosclerosis. Skull: The visualized skull base, calvarium and extracranial soft tissues are normal. Sinuses/Orbits: No fluid levels or advanced mucosal thickening of the visualized paranasal sinuses. No mastoid or middle ear effusion. The orbits are normal. ASPECTS [facility] Stroke Program Early CT Score) - Ganglionic level infarction (caudate, lentiform nuclei, internal capsule, insula, M1-M3 cortex): 7 - Supraganglionic infarction (M4-M6 cortex): 3 Total score (0-10 with 10 being normal): 10 IMPRESSION: 1. No acute intracranial abnormality. 2.  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 3. ASPECTS is 10. These results were communicated to Dr. Milon Dikes at 10:37 pm on 04/22/2022 by text page via the [facility] messaging system. Electronically Signed   By: Deatra Robinson M.D.   On: 04/22/2022 22:38    Procedures .Critical Care  Performed by: Virgina Norfolk, DO Authorized by: Virgina Norfolk, DO   Critical care provider statement:    Critical care time (minutes):  40   Critical care was time spent personally by me on the following activities:  Blood draw for specimens, development of treatment plan with patient or surrogate, discussions with primary provider, discussions with consultants, evaluation of patient's response to treatment, examination of patient, obtaining history from patient or surrogate, ordering and performing treatments and interventions, ordering and review of laboratory studies, ordering and review of radiographic studies, pulse oximetry, re-evaluation of patient's condition and review of old charts   I assumed direction of critical care for this patient from another provider in my specialty: no       Medications  Ordered in ED Medications   stroke: early stages of recovery book (has no administration in time range)  0.9 %  sodium chloride infusion ( Intravenous New Bag/Given 04/22/22 2309)  acetaminophen (TYLENOL) tablet 650 mg (has no administration in time range)    Or  acetaminophen (TYLENOL) 160 MG/5ML solution 650 mg (has no administration in time range)    Or  acetaminophen (TYLENOL) suppository 650 mg (has no administration in time range)  senna-docusate (Senokot-S) tablet 1 tablet (has no administration in time range)  pantoprazole (PROTONIX) injection 40 mg (40 mg Intravenous Given 04/22/22 2308)  labetalol (NORMODYNE) injection 10 mg (has no administration in time range)  clevidipine (CLEVIPREX) infusion 0.5 mg/mL (2 mg/hr Intravenous New Bag/Given 04/22/22 2310)  tenecteplase (TNKASE) injection for Stroke 20 mg (20 mg Intravenous Given 04/22/22 2235)  iohexol (OMNIPAQUE) 350 MG/ML injection 75 mL (75 mLs Intravenous Contrast Given 04/22/22 2242)    ED Course/ Medical Decision Making/ A&P                             Medical Decision Making Risk Decision regarding hospitalization.   ZOWIE LUNDAHL is here as a code stroke.  Patient arrives with left-sided numbness, dysarthria, left-sided weakness that started about an hour ago.  History of stroke on Plavix, history of hypertension.  She arrives with unremarkable vitals.  Went directly to CT with neurology Dr. Jerrell Belfast.  CT scan per review and interpretation does not show any head bleed.  Patient was given TNK.  Lab work per my review and interpretation unremarkable except for mild creatinine elevation at 2.2.  No LVO.  Will not go for thrombectomy.  Overall patient given TNK.  Symptoms improving.  Will go to neurological ICU for further stroke care.  This chart was dictated using voice recognition software.  Despite best efforts to proofread,  errors can occur which can change the documentation meaning.         Final Clinical  Impression(s) / ED Diagnoses Final diagnoses:  Cerebrovascular accident (CVA), unspecified mechanism    Rx / DC Orders ED Discharge Orders     None         Virgina Norfolk, DO 04/22/22 2325

## 2022-04-22 NOTE — H&P (Signed)
Stroke Neurology Admission History & Physical   CC: Facial droop and weakness-code stroke  History is obtained from: Patient, chart  HPI: Gabrielle Schultz is a 52 y.o. female past medical history of multiple prior strokes, CAD, hypertension, renal insufficiency who presents via EMS for evaluation of sudden onset of left-sided flaccidity and right facial weakness with last known well at 9:20 PM. EMS reports that they were called in because the patient had a sudden onset of left-sided flaccid paralysis.  On their initial evaluation, she was flaccid on the left arm and leg but had right facial weakness.  Her left-sided arm weakness started to improve on the way but she still had some slurred speech and facial asymmetry. She was brought in as a code stroke due to prior history of strokes and still being in the window for treatment. Evaluated emergently on the bridge.  Noncontrast head CT done, personally reviewed-negative for bleed. Risk benefits alternatives of TNKase discussed-she agreed and it was administered at 10:36 PM.  Patient follows with Dr. Stanton Kidney neurology as an outpatient. Has had Zio patch placed in the past with no evidence of A-fib.  LKW: 9:20 PM IV thrombolysis given?:  Yes Premorbid modified Rankin scale (mRS): 2   ROS: Full ROS was performed and is negative except as noted in the HPI.   Past Medical History:  Diagnosis Date   Acid reflux    Allergic rhinitis 06/26/2015   Allergy    Asthma    Carpal tunnel syndrome    right hand   Chronic kidney disease    Colitis    CVA (cerebral infarction)    2009, 2015   Gout    History of syncope    Hypertension 2008   Low HDL (under 40) 06/26/2015   MI (myocardial infarction)    2015 - patient wasnt told by cardiologist that she had MI but has had cardiac cath in past   Migraines    Poor circulation    Renal insufficiency    Stroke    Syncope and collapse      Family History  Problem Relation Age of Onset    Hyperlipidemia Mother    Heart disease Mother        has a defibrillator   COPD Mother    Hypertension Father    Heart disease Father    Hyperlipidemia Father    Hypertension Sister        died in her 30s from heart failure and end stage kidney disease   Kidney disease Sister    Hypertension Brother    Heart disease Brother        has a defibrillator   Coronary artery disease Brother        had CABG   Hyperlipidemia Brother    Healthy Child    Diabetes Child      Social History:   reports that she has been smoking cigarettes. She started smoking about 5 years ago. She has a 16.50 pack-year smoking history. She has never used smokeless tobacco. She reports current alcohol use. She reports that she does not use drugs.  Medications  Current Facility-Administered Medications:    [START ON 04/23/2022]  stroke: early stages of recovery book, , Does not apply, Once, Milon Dikes, MD   0.9 %  sodium chloride infusion, , Intravenous, Continuous, Milon Dikes, MD, Last Rate: 75 mL/hr at 04/22/22 2309, New Bag at 04/22/22 2309   acetaminophen (TYLENOL) tablet 650 mg, 650 mg, Oral, Q4H PRN **  OR** acetaminophen (TYLENOL) 160 MG/5ML solution 650 mg, 650 mg, Per Tube, Q4H PRN **OR** acetaminophen (TYLENOL) suppository 650 mg, 650 mg, Rectal, Q4H PRN, Milon Dikes, MD   clevidipine (CLEVIPREX) infusion 0.5 mg/mL, 0-21 mg/hr, Intravenous, Continuous, Milon Dikes, MD, Last Rate: 4 mL/hr at 04/22/22 2310, 2 mg/hr at 04/22/22 2310   labetalol (NORMODYNE) injection 10 mg, 10 mg, Intravenous, Q2H PRN, Milon Dikes, MD   pantoprazole (PROTONIX) injection 40 mg, 40 mg, Intravenous, QHS, Milon Dikes, MD, 40 mg at 04/22/22 2308   senna-docusate (Senokot-S) tablet 1 tablet, 1 tablet, Oral, QHS PRN, Milon Dikes, MD  Current Outpatient Medications:    acetaminophen (TYLENOL) 650 MG CR tablet, Take 650-1,300 mg by mouth every 8 (eight) hours as needed for pain., Disp: , Rfl:    albuterol (PROVENTIL  HFA) 108 (90 Base) MCG/ACT inhaler, Inhale 2 puffs into the lungs once every 4 (four) hours as needed for wheezing (cough)., Disp: 18 g, Rfl: 5   allopurinol (ZYLOPRIM) 100 MG tablet, TAKE ONE TABLET BY MOUTH ONCE DAILY., Disp: 90 tablet, Rfl: 3   amLODipine (NORVASC) 2.5 MG tablet, Take 1 tablet (2.5 mg total) by mouth daily., Disp: 90 tablet, Rfl: 0   aspirin EC 81 MG tablet, Take 1 tablet (81 mg total) by mouth once daily. Swallow whole., Disp: 30 tablet, Rfl: 0   atorvastatin (LIPITOR) 80 MG tablet, Take 1 tablet (80 mg total) by mouth daily. (Patient taking differently: Take 80 mg by mouth at bedtime.), Disp: 90 tablet, Rfl: 0   cetirizine (ZYRTEC) 10 MG tablet, TAKE ONE TABLET BY MOUTH ONCE DAILY., Disp: 90 tablet, Rfl: 3   cloNIDine (CATAPRES) 0.3 MG tablet, Take 1 tablet by mouth 3 times daily (morning, noon, and evening)., Disp: 270 tablet, Rfl: 0   clopidogrel (PLAVIX) 75 MG tablet, Take 1 tablet (75 mg total) by mouth once daily., Disp: 90 tablet, Rfl: 0   docusate sodium (COLACE) 100 MG capsule, Take 100 mg by mouth in the morning., Disp: , Rfl:    fluticasone (FLONASE) 50 MCG/ACT nasal spray, USE 1 SPRAY INTO BOTH NOSTRILS 2 TIMES A DAY (Patient taking differently: Place 1 spray into both nostrils at bedtime. USE 1 SPRAY INTO BOTH NOSTRILS 2 TIMES A DAY), Disp: 16 g, Rfl: 0   fluticasone-salmeterol (WIXELA INHUB) 250-50 MCG/ACT AEPB, Inhale one puff into the lungs in the morning and at bedtime, Disp: 60 each, Rfl: 11   furosemide (LASIX) 20 MG tablet, Take 1 tablet (20 mg total) by mouth daily as needed for fluid or edema. (Patient taking differently: Take 20 mg by mouth in the morning.), Disp: 90 tablet, Rfl: 0   losartan (COZAAR) 50 MG tablet, Take 1 tablet (50 mg total) by mouth daily. (Patient taking differently: Take 25 mg by mouth in the morning.), Disp: 90 tablet, Rfl: 0   omeprazole (PRILOSEC) 20 MG capsule, Take 1 capsule (20 mg total) by mouth daily before breakfast., Disp: 90  capsule, Rfl: 1   pantoprazole (PROTONIX) 40 MG tablet, Take 1 tablet (40 mg total) by mouth daily., Disp: 90 tablet, Rfl: 1   potassium chloride (KLOR-CON M) 10 MEQ tablet, Take 1 tablet (10 mEq total) by mouth daily. To take whenever you take lasix, Disp: 90 tablet, Rfl: 1   promethazine (PHENERGAN) 25 MG tablet, Take 1 tablet (25 mg total) by mouth as needed for nausea or vomiting., Disp: 30 tablet, Rfl: 2  Exam: Current vital signs: BP (!) 159/68   Pulse 87   Temp 97.9  F (36.6 C)   Resp 16   Wt 79 kg   LMP 08/18/2020 (Approximate) Comment: Tubal ligation  SpO2 100%   BMI 32.91 kg/m  Vital signs in last 24 hours: Temp:  [97.9 F (36.6 C)] 97.9 F (36.6 C) (04/18 2250) Pulse Rate:  [72-87] 87 (04/18 2305) Resp:  [10-16] 16 (04/18 2305) BP: (159)/(68) 159/68 (04/18 2300) SpO2:  [100 %] 100 % (04/18 2305) Weight:  [79 kg] 79 kg (04/18 2200) General: Awake alert in no distress HEENT: Normocephalic atraumatic Lungs: Clear Vascular: Regular rhythm Abdomen nondistended nontender Neurological exam She is awake alert oriented x 3 Speech is dysarthric No evidence of aphasia Cranial nerve examination: Pupils equal round react light, extraocular movements intact, right visual field deficit that is chronic, facial sensation intact, left lower facial weakness, auditory acuity intact, tongue and palate midline. Motor examination with no drift but she is slightly weaker on the left in comparison to the right Sensation diminished on the left Coordination examination with no dysmetria NIH stroke scale 1a Level of Conscious.: 0 1b LOC Questions: 0 1c LOC Commands: 0 2 Best Gaze: 0 3 Visual: 0 4 Facial Palsy: 1 5a Motor Arm - left: 0 5b Motor Arm - Right: 0 6a Motor Leg - Left: 0 6b Motor Leg - Right: 0 7 Limb Ataxia: 0 8 Sensory: 2 9 Best Language: 0 10 Dysarthria: 1 11 Extinct. and Inatten.: 0 TOTAL: 4   Labs I have reviewed labs in epic and the results pertinent to this  consultation are:  CBC    Component Value Date/Time   WBC 9.9 04/22/2022 2225   RBC 3.31 (L) 04/22/2022 2225   HGB 12.9 04/22/2022 2227   HGB 12.3 05/21/2021 1932   HCT 38.0 04/22/2022 2227   HCT 35.6 05/21/2021 1932   PLT 220 04/22/2022 2225   PLT 242 05/21/2021 1932   MCV 105.1 (H) 04/22/2022 2225   MCV 96 05/21/2021 1932   MCV 93 05/19/2013 0541   MCH 35.6 (H) 04/22/2022 2225   MCHC 33.9 04/22/2022 2225   RDW 18.6 (H) 04/22/2022 2225   RDW 15.4 05/21/2021 1932   RDW 12.4 05/19/2013 0541   LYMPHSABS 3.6 04/22/2022 2225   LYMPHSABS 2.5 05/21/2021 1932   LYMPHSABS 1.1 05/19/2013 0541   MONOABS 0.6 04/22/2022 2225   MONOABS 0.3 05/19/2013 0541   EOSABS 0.7 (H) 04/22/2022 2225   EOSABS 0.4 05/21/2021 1932   EOSABS 0.0 05/19/2013 0541   BASOSABS 0.1 04/22/2022 2225   BASOSABS 0.1 05/21/2021 1932   BASOSABS 0.0 05/19/2013 0541    CMP     Component Value Date/Time   NA 142 04/22/2022 2227   NA 141 11/19/2021 1851   NA 132 (L) 05/20/2013 0508   K 5.2 (H) 04/22/2022 2227   K 3.9 05/20/2013 0508   CL 111 04/22/2022 2227   CL 107 05/20/2013 0508   CO2 19 (L) 04/22/2022 2225   CO2 21 05/20/2013 0508   GLUCOSE 95 04/22/2022 2227   GLUCOSE 91 05/20/2013 0508   BUN 34 (H) 04/22/2022 2227   BUN 20 11/19/2021 1851   BUN 16 05/20/2013 0508   CREATININE 2.30 (H) 04/22/2022 2227   CREATININE 1.65 (H) 05/20/2013 0508   CALCIUM 9.3 04/22/2022 2225   CALCIUM 7.9 (L) 05/20/2013 0508   PROT 6.8 04/22/2022 2225   PROT 6.1 11/19/2021 1851   PROT 7.8 05/18/2013 2113   ALBUMIN 3.7 04/22/2022 2225   ALBUMIN 3.7 (L) 11/19/2021 1851   ALBUMIN 3.8  05/18/2013 2113   AST 22 04/22/2022 2225   AST 29 05/18/2013 2113   ALT 15 04/22/2022 2225   ALT 18 05/18/2013 2113   ALKPHOS 125 04/22/2022 2225   ALKPHOS 196 (H) 05/18/2013 2113   BILITOT 0.3 04/22/2022 2225   BILITOT <0.2 11/19/2021 1851   BILITOT 0.5 05/18/2013 2113   GFRNONAA 26 (L) 04/22/2022 2225   GFRNONAA 38 (L) 05/20/2013  0508   GFRAA 35 (L) 01/10/2020 1913   GFRAA 45 (L) 05/20/2013 0508    Lipid Panel     Component Value Date/Time   CHOL 112 01/10/2022 0451   CHOL 188 01/10/2020 1913   CHOL 123 10/20/2012 0226   TRIG 142 01/10/2022 0451   TRIG 85 10/20/2012 0226   HDL 21 (L) 01/10/2022 0451   HDL 34 (L) 01/10/2020 1913   HDL 20 (L) 10/20/2012 0226   CHOLHDL 5.3 01/10/2022 0451   VLDL 28 01/10/2022 0451   VLDL 17 10/20/2012 0226   LDLCALC 63 01/10/2022 0451   LDLCALC 126 (H) 01/10/2020 1913   LDLCALC 86 10/20/2012 0226     Imaging I have reviewed the images obtained:  CT-head-aspects 10.  Old left PCA infarct. CTA head and neck: No ELVO  Assessment:  52 year old past history of multiple prior strokes with residual left-sided symptoms being brought in for evaluation of sudden onset of right facial weakness and left-sided flaccid paralysis.  Some of the symptoms were improving on the way.  On my examination she still had dysarthria, left facial droop and subjective sensory loss on the left, no drift but subtle weakness on the left upper and lower extremity. Symptoms localizable to either a lacunar infarction in the right hemisphere or a brainstem infarct. Risk benefits alternatives of TNKase discussed-patient agreed to proceed. She will be admitted after TNK administration for postthrombolytic care Of note she has been seen at the outside hospital once for similar episodes with negative MRI.  Recrudescence of old stroke symptoms is also in the differentials at this time.  Plan:  Acute Ischemic Stroke  Acuity: Acute Current Suspected Etiology: Small vessel etiology versus recrudescence of old symptoms Continue Evaluation:  -Admit to: Neurological ICU -Hold Aspirin until 24 hour post IV thrombolysis (tPA or TNKase) neuroimaging is stable and without evidence of bleeding -Blood pressure control, goal of less than 180/105 -MRI/ECHO/A1C/Lipid panel. -Hyperglycemia management per SSI to  maintain glucose 140-180mg /dL. -PT/OT/ST therapies and recommendations when able  CNS -Close neuro monitoring  Dysarthria Dysphagia following cerebral infarction  -NPO until cleared by speech/bedside swallow eval -ST -Advance diet as tolerated   Hemiplegia and hemiparesis following cerebral infarction affecting left non-dominant side  -PT/OT -PM&R consult  RESP No active issues  CV Essential hypertension Came in somewhat of hypertensive urgency with systolics in the 180s.  In the field was 220s. -Blood pressure goal as above -Labetalol as needed. -Further control by Cleviprex gtt. -TTE  Hyperlipidemia, unspecified  - Statin for goal LDL < 70  HEME Anemia-MCV greater than 100. Check B12 levels Check iron panel Check CBC in the morning Transfuse if hemoglobin less than 7.  ENDO -goal HgbA1c < 7  GI/GU AKI on CKD 4. -Gentle hydration -avoid nephrotoxic agents   Fluid/Electrolyte Disorders Check labs in the morning and replete electrolytes as necessary.  ID Possible Aspiration PNA -CXR -NPO -Monitor  Prophylaxis DVT: SCD GI: Protonix Bowel: Docusate senna   Diet: NPO until cleared by speech/bedside swallow eval  Code Status: Full code   THE FOLLOWING WERE PRESENT  ON ADMISSION: Acute ischemic stroke, hypertensive urgency, AKI on CKD 4  Risks, benefits, alternatives of IV thrombolysis were discussed and family/patient agreed to proceed. CT imaging was reviewed personally prior to IV thrombolysis administration with no evidence of bleed. -- Milon Dikes, MD Neurologist [facility] Pager: [phone]

## 2022-04-22 NOTE — ED Notes (Signed)
Pt removed monitoring equipment refuses to do swallow screen stating my nose is stopped up so I can't breathe and continues to bend arm at elbow causing fluids to stop running stating I am right handed so I have to move this arm

## 2022-04-23 ENCOUNTER — Inpatient Hospital Stay (HOSPITAL_COMMUNITY): Payer: Medicaid Other

## 2022-04-23 ENCOUNTER — Encounter (HOSPITAL_COMMUNITY): Payer: Self-pay | Admitting: Student in an Organized Health Care Education/Training Program

## 2022-04-23 ENCOUNTER — Ambulatory Visit: Payer: Medicaid Other | Admitting: Medical

## 2022-04-23 ENCOUNTER — Other Ambulatory Visit (HOSPITAL_COMMUNITY): Payer: Medicaid Other

## 2022-04-23 DIAGNOSIS — R2990 Unspecified symptoms and signs involving the nervous system: Secondary | ICD-10-CM | POA: Diagnosis not present

## 2022-04-23 DIAGNOSIS — M3211 Endocarditis in systemic lupus erythematosus: Secondary | ICD-10-CM

## 2022-04-23 DIAGNOSIS — Z8673 Personal history of transient ischemic attack (TIA), and cerebral infarction without residual deficits: Secondary | ICD-10-CM

## 2022-04-23 DIAGNOSIS — G43109 Migraine with aura, not intractable, without status migrainosus: Secondary | ICD-10-CM

## 2022-04-23 LAB — LIPID PANEL
Cholesterol: 115 mg/dL (ref 0–200)
HDL: 26 mg/dL — ABNORMAL LOW (ref >40)
LDL Cholesterol: 69 mg/dL (ref 0–99)
Total CHOL/HDL Ratio: 4.4 RATIO
Triglycerides: 100 mg/dL (ref <150)
VLDL: 20 mg/dL (ref 0–40)

## 2022-04-23 LAB — URINALYSIS, ROUTINE W REFLEX MICROSCOPIC
Bacteria, UA: NONE SEEN
Bilirubin Urine: NEGATIVE
Glucose, UA: NEGATIVE mg/dL
Ketones, ur: NEGATIVE mg/dL
Nitrite: POSITIVE — AB
Protein, ur: 100 mg/dL — AB
Specific Gravity, Urine: 1.011 (ref 1.005–1.030)
WBC, UA: >50 WBC/hpf (ref 0–5)
pH: 7 (ref 5.0–8.0)

## 2022-04-23 LAB — MRSA NEXT GEN BY PCR, NASAL: MRSA by PCR Next Gen: NOT DETECTED

## 2022-04-23 LAB — HEMOGLOBIN A1C
Hgb A1c MFr Bld: 5.5 % (ref 4.8–5.6)
Mean Plasma Glucose: 111.15 mg/dL

## 2022-04-23 LAB — GLUCOSE, CAPILLARY: Glucose-Capillary: 98 mg/dL (ref 70–99)

## 2022-04-23 LAB — RAPID URINE DRUG SCREEN, HOSP PERFORMED
Amphetamines: NOT DETECTED
Barbiturates: NOT DETECTED
Benzodiazepines: NOT DETECTED
Cocaine: NOT DETECTED
Opiates: NOT DETECTED
Tetrahydrocannabinol: NOT DETECTED

## 2022-04-23 MED ORDER — FUROSEMIDE 20 MG PO TABS
20.0000 mg | ORAL_TABLET | Freq: Every day | ORAL | Status: DC
  Administered 2022-04-24: 20 mg via ORAL
  Filled 2022-04-23: qty 1

## 2022-04-23 MED ORDER — PANTOPRAZOLE SODIUM 40 MG PO TBEC
40.0000 mg | DELAYED_RELEASE_TABLET | Freq: Every day | ORAL | Status: DC
  Administered 2022-04-23 – 2022-04-24 (×2): 40 mg via ORAL
  Filled 2022-04-23 (×2): qty 1

## 2022-04-23 MED ORDER — AMLODIPINE BESYLATE 2.5 MG PO TABS: 2.5000 mg | ORAL_TABLET | Freq: Every day | ORAL | Status: DC

## 2022-04-23 MED ORDER — LOSARTAN POTASSIUM 50 MG PO TABS
50.0000 mg | ORAL_TABLET | Freq: Every evening | ORAL | Status: DC
  Administered 2022-04-23: 50 mg via ORAL
  Filled 2022-04-23: qty 1

## 2022-04-23 MED ORDER — AMLODIPINE BESYLATE 5 MG PO TABS
5.0000 mg | ORAL_TABLET | Freq: Every day | ORAL | Status: DC
  Administered 2022-04-24: 5 mg via ORAL
  Filled 2022-04-23: qty 1

## 2022-04-23 MED ORDER — ATORVASTATIN CALCIUM 80 MG PO TABS
80.0000 mg | ORAL_TABLET | Freq: Every day | ORAL | Status: DC
  Administered 2022-04-24: 80 mg via ORAL
  Filled 2022-04-23: qty 1

## 2022-04-23 MED ORDER — LORAZEPAM 1 MG PO TABS
2.0000 mg | ORAL_TABLET | Freq: Once | ORAL | Status: AC | PRN
  Administered 2022-04-23: 2 mg via ORAL
  Filled 2022-04-23: qty 2

## 2022-04-23 MED ORDER — LOSARTAN POTASSIUM 50 MG PO TABS: 25.0000 mg | ORAL_TABLET | Freq: Every morning | ORAL | Status: DC

## 2022-04-23 MED ORDER — CHLORHEXIDINE GLUCONATE CLOTH 2 % EX PADS
6.0000 | MEDICATED_PAD | Freq: Every day | CUTANEOUS | Status: DC
  Administered 2022-04-23: 6 via TOPICAL

## 2022-04-23 NOTE — Progress Notes (Signed)
eLink Physician-Brief Progress Note Patient Name: Gabrielle Schultz DOB: March 31, 1970 MRN: [id number]   Date of Service  04/23/2022  HPI/Events of Note  52 y.o. female past medical history of multiple prior strokes, CAD, hypertension, renal insufficiency who presents via EMS for evaluation of sudden onset of left-sided flaccidity and right facial weakness with last known well at 9:20 PM   CT head was unremarkable.  CT angio showed no large vessel occlusion.  Metabolic panel with mild hypokalemia and elevated creatinine compared to baseline.  Stable CBC.  Urinalysis with positive leukocyte esterase and nitrates.  Negative UDS.  Tenecteplase given at 2245.   eICU Interventions  Agree with clevidipine to maintain SBP less than 180.  Stroke workup ordered including restratification labs, MRI, echocardiogram.  Standard tenecteplase precautions, acute hemodynamics, avoid unnecessary punctures.  Anticipate initiation of statins, antiplatelet agents once tenecteplase wears off.  No acute intervention indicated currently.     Intervention Category Evaluation Type: New Patient Evaluation  [name] 04/23/2022, 1:36 AM

## 2022-04-23 NOTE — Progress Notes (Signed)
OT Cancellation Note  Patient Details Name: Gabrielle Schultz MRN: [id number] DOB: 03/03/1970   Cancelled Treatment:    Reason Eval/Treat Not Completed: Active bedrest order;Patient not medically ready Given TNK 2236  on active BR  OT order received and appreciated however this conflicts with current bedrest order set. Please increase activity tolerance as appropriate and remove bedrest from orders. . Please contact OT at [phone] if bed rest order is discontinued. OT will hold evaluation at this time and will check back as time allows pending increased activity orders.   Mateo Flow 04/23/2022, 6:26 AM

## 2022-04-23 NOTE — Progress Notes (Deleted)
Cardiology Office Note:    Date:  04/23/2022   ID:  Gabrielle Schultz, DOB 04/11/1970, MRN [id number]  PCP:  Sallyanne Kuster, NP  [facility] Cardiologist:  Julien Nordmann, MD  [facility] Electrophysiologist:  None   Referring MD: Sallyanne Kuster, NP   Chief Complaint: ***  History of Present Illness:    Gabrielle Schultz is a 52 y.o. female with a hx of hypertension, recurrent strokes, CKD, PVD, tobacco use who is being seen for 73-month follow-up.  Patient was seen in 2013 with nausea, right-sided facial tingling and numbness found to have severe hypertension.  Exercise stress test in 2017 was normal with no evidence of ischemia, mildly impaired exercise capacity.  Echo 2018 showed normal LVEF, severe AI, moderate MR, moderate TR.   Prior echocardiogram showed moderate aortic valve regurgitation.   Echo TEE in November 2021 for stroke showed type III diastolic dysfunction, moderate MR, no mobile elements, possible endocarditis versus healed vegetations, moderate aortic regurgitation, normal RV function, trivial MR, negative bubble study.   Patient has a history of multiple strokes. Patient was admitted for stroke in August 2023.  Echo 08/20/21 showed LVEF 60-65%, no WMA, mild LVH, G1DD, mildly dilated LA, moderate AI. Heart monitor at discharge showed NSR, 23 runs of NSVT longest 20.8 seconds, 10 runs of SVT, rare ectopy, no triggered events.  No A-fib noted.   The patient was recently admitted for chest pain that lasted about 15 minutes. BP was severely elevated. HS troponin went up to 125. A Myoview Lexiscan was ordered. This showed no significant ischemia, normal wall motion, EF 50%, trace aortic atherosclerosis, no significant coronary calcification, low risk scan.   Echo 01/10/22 showed LVEF 60 to 65%, mild to moderate MR moderate to severe AI with mild to moderate stenosis.  Patient was referred to the structural heart team.  Patient underwent TEE showing mild AAS and at least  moderate to severe AI, mild to moderate MR.  Plan for patient to see CTS for aortic and mitral valve surgery options.  Past Medical History:  Diagnosis Date   Acid reflux    Allergic rhinitis 06/26/2015   Allergy    Asthma    Carpal tunnel syndrome    right hand   Chronic kidney disease    Colitis    CVA (cerebral infarction)    2009, 2015   Gout    History of syncope    Hypertension 2008   Low HDL (under 40) 06/26/2015   MI (myocardial infarction)    2015 - patient wasnt told by cardiologist that she had MI but has had cardiac cath in past   Migraines    Poor circulation    Renal insufficiency    Stroke    Syncope and collapse     Past Surgical History:  Procedure Laterality Date   BUBBLE STUDY  12/03/2019   Procedure: BUBBLE STUDY;  Surgeon: Sande Rives, MD;  Location: [facility] ENDOSCOPY;  Service: Cardiovascular;;   BUBBLE STUDY  03/17/2022   Procedure: BUBBLE STUDY;  Surgeon: Thurmon Fair, MD;  Location: [facility] ENDOSCOPY;  Service: Cardiovascular;;   CARDIAC CATHETERIZATION     CESAREAN SECTION  1994   Dr. Arvil Chaco   COLONOSCOPY WITH PROPOFOL N/A 06/26/2014   Procedure: COLONOSCOPY WITH PROPOFOL;  Surgeon: Elnita Maxwell, MD;  Location: [facility] ENDOSCOPY;  Service: Endoscopy;  Laterality: N/A;   DILATION AND CURETTAGE OF UTERUS  x2 for incomplete SABs   TEE WITHOUT CARDIOVERSION N/A 12/03/2019  Procedure: TRANSESOPHAGEAL ECHOCARDIOGRAM (TEE);  Surgeon: Sande Rives, MD;  Location: [facility] ENDOSCOPY;  Service: Cardiovascular;  Laterality: N/A;   TEE WITHOUT CARDIOVERSION N/A 03/17/2022   Procedure: TRANSESOPHAGEAL ECHOCARDIOGRAM (TEE);  Surgeon: Thurmon Fair, MD;  Location: [facility] ENDOSCOPY;  Service: Cardiovascular;  Laterality: N/A;    Current Medications: No outpatient medications have been marked as taking for the 04/23/22 encounter (Appointment) with Fransico Michael, Catcher Dehoyos H, PA-C.     Allergies:   Coconut (cocos nucifera), Amoxicillin, Penicillins, and Singulair  [montelukast sodium]   Social History   Socioeconomic History   Marital status: Married    Spouse name: Not on file   Number of children: 2   Years of education: 11th grade   Highest education level: 11th grade  Occupational History   Occupation: unemployed  Tobacco Use   Smoking status: Every Day    Packs/day: 0.50    Years: 33.00    Additional pack years: 0.00    Total pack years: 16.50    Types: Cigarettes    Start date: 03/04/2017   Smokeless tobacco: Never  Vaping Use   Vaping Use: Never used  Substance and Sexual Activity   Alcohol use: Yes    Comment: rare- drinks an occassional wine cooler   Drug use: No   Sexual activity: Yes    Partners: Male    Birth control/protection: None  Other Topics Concern   Not on file  Social History Narrative   Husband is a Naval architect for [employer] and is the sole provider for the family including 2 children (54 yr old and 1 yr old)    Goes to food bank for food. Says "really worried" about water and light bill. Husband, baby, 108 year old, her, and mother all live together.    Patient would like appointment with [facility].       Pt is right handed   Social Determinants of Health   Financial Resource Strain: High Risk (08/02/2017)   Overall Financial Resource Strain (CARDIA)    Difficulty of Paying Living Expenses: Hard  Food Insecurity: Food Insecurity Present (05/21/2021)   Hunger Vital Sign    Worried About Running Out of Food in the Last Year: Sometimes true    Ran Out of Food in the Last Year: Sometimes true  Transportation Needs: No Transportation Needs (05/21/2021)   PRAPARE - Administrator, Civil Service (Medical): No    Lack of Transportation (Non-Medical): No  Physical Activity: Insufficiently Active (07/25/2018)   Exercise Vital Sign    Days of Exercise per Week: 2 days    Minutes of Exercise per Session: 60 min  Stress: No Stress Concern Present (04/20/2022)   [employer] of Occupational Health -  Occupational Stress Questionnaire    Feeling of Stress : Only a little  Social Connections: Somewhat Isolated (08/02/2017)   Social Connection and Isolation Panel [NHANES]    Frequency of Communication with Friends and Family: More than three times a week    Frequency of Social Gatherings with Friends and Family: Never    Attends Religious Services: Never    Database administrator or Organizations: No    Attends Engineer, structural: Never    Marital Status: Married     Family History: The patient's ***family history includes COPD in her mother; Coronary artery disease in her brother; Diabetes in her child; Healthy in her child; Heart disease in her brother, father, and mother; Hyperlipidemia in her brother, father, and mother; Hypertension  in her brother, father, and sister; Kidney disease in her sister.  ROS:   Please see the history of present illness.    *** All other systems reviewed and are negative.  EKGs/Labs/Other Studies Reviewed:    The following studies were reviewed today: ***  EKG:  EKG is *** ordered today.  The ekg ordered today demonstrates ***  Recent Labs: 08/19/2021: TSH 1.356 12/03/2021: Magnesium 1.9 04/22/2022: ALT 15; BUN 34; Creatinine, Ser 2.30; Hemoglobin 12.9; Platelets 220; Potassium 5.2; Sodium 142  Recent Lipid Panel    Component Value Date/Time   CHOL 115 04/23/2022 0332   CHOL 188 01/10/2020 1913   CHOL 123 10/20/2012 0226   TRIG 100 04/23/2022 0332   TRIG 85 10/20/2012 0226   HDL 26 (L) 04/23/2022 0332   HDL 34 (L) 01/10/2020 1913   HDL 20 (L) 10/20/2012 0226   CHOLHDL 4.4 04/23/2022 0332   VLDL 20 04/23/2022 0332   VLDL 17 10/20/2012 0226   LDLCALC 69 04/23/2022 0332   LDLCALC 126 (H) 01/10/2020 1913   LDLCALC 86 10/20/2012 0226     Risk Assessment/Calculations:   {Does this patient have ATRIAL FIBRILLATION?:[id number]}   Physical Exam:    VS:  LMP 08/18/2020 (Approximate) Comment: Tubal ligation    Wt Readings from  Last 3 Encounters:  04/22/22 174 lb 2.6 oz (79 kg)  03/22/22 159 lb 12.8 oz (72.5 kg)  03/17/22 162 lb (73.5 kg)     GEN: *** Well nourished, well developed in no acute distress HEENT: Normal NECK: No JVD; No carotid bruits LYMPHATICS: No lymphadenopathy CARDIAC: ***RRR, no murmurs, rubs, gallops RESPIRATORY:  Clear to auscultation without rales, wheezing or rhonchi  ABDOMEN: Soft, non-tender, non-distended MUSCULOSKELETAL:  No edema; No deformity  SKIN: Warm and dry NEUROLOGIC:  Alert and oriented x 3 PSYCHIATRIC:  Normal affect   ASSESSMENT:    No diagnosis found. PLAN:    In order of problems listed above:  ***  Disposition: Follow up {follow up:15908} with ***   Shared Decision Making/Informed Consent   {Are you ordering a CV Procedure (e.g. stress test, cath, DCCV, TEE, etc)?   Press F2        :[id number]}    Signed, Dallin Mccorkel David Stall, PA-C  04/23/2022 8:20 AM    [facility]

## 2022-04-23 NOTE — ED Notes (Signed)
ED TO INPATIENT HANDOFF REPORT  ED Nurse Name and Phone #: [phone] Tamiah Dysart  S Name/Age/Gender [name] 52 y.o. female Room/Bed: 015C/015C  Code Status   Code Status: Full Code  Home/SNF/Other Home Patient oriented to: self, place, time, and situation Is this baseline? Yes   Triage Complete: Triage complete  Chief Complaint Acute ischemic stroke [I63.9]  Triage Note Patient BIB EMS from home due to speech issue at 9:20. EMS reports R facial droop and L arm paralysis. Upon arrival patient L facial droop. Patient A&Ox4.   Allergies Allergies  Allergen Reactions   Coconut (Cocos Nucifera) Hives and Swelling    Just coconut ("all coconut")   Amoxicillin Other (See Comments)    "makes me sick" "swelled up everywhere" Has patient had a PCN reaction causing immediate rash, facial/tongue/throat swelling, SOB or lightheadedness with hypotension: No Has patient had a PCN reaction causing severe rash involving mucus membranes or skin necrosis: No Has patient had a PCN reaction that required hospitalization: No Has patient had a PCN reaction occurring within the last 10 years: No If all of the above answers are "NO", then may proceed with Cephalosporin use.     Penicillins Other (See Comments)    "makes me sick" "swelled up oil" Has patient had a PCN reaction causing immediate rash, facial/tongue/throat swelling, SOB or lightheadedness with hypotension: No Has patient had a PCN reaction causing severe rash involving mucus membranes or skin necrosis: No Has patient had a PCN reaction that required hospitalization: No Has patient had a PCN reaction occurring within the last 10 years: Yes If all of the above answers are "NO", then may proceed with Cephalosporin use.    Singulair [Montelukast Sodium] Rash    "made heart race"    Level of Care/Admitting Diagnosis ED Disposition     ED Disposition  Admit   Condition  --   Comment  Hospital Area: [facility] [[id number]]  Level of Care: ICU [6]  May admit patient to [facility] or [facility] if equivalent level of care is available:: No  Covid Evaluation: Asymptomatic - no recent exposure (last 10 days) testing not required  Diagnosis: Acute ischemic stroke [360503]  Admitting Physician: Milon Dikes [[id number]]  Attending Physician: Milon Dikes [[id number]]  Certification:: I certify this patient will need inpatient services for at least 2 midnights  Estimated Length of Stay: 2          B Medical/Surgery History Past Medical History:  Diagnosis Date   Acid reflux    Allergic rhinitis 06/26/2015   Allergy    Asthma    Carpal tunnel syndrome    right hand   Chronic kidney disease    Colitis    CVA (cerebral infarction)    2009, 2015   Gout    History of syncope    Hypertension 2008   Low HDL (under 40) 06/26/2015   MI (myocardial infarction)    2015 - patient wasnt told by cardiologist that she had MI but has had cardiac cath in past   Migraines    Poor circulation    Renal insufficiency    Stroke    Syncope and collapse    Past Surgical History:  Procedure Laterality Date   BUBBLE STUDY  12/03/2019   Procedure: BUBBLE STUDY;  Surgeon: Sande Rives, MD;  Location: [facility] ENDOSCOPY;  Service: Cardiovascular;;   BUBBLE STUDY  03/17/2022   Procedure: BUBBLE STUDY;  Surgeon: Thurmon Fair, MD;  Location: [facility]  ENDOSCOPY;  Service: Cardiovascular;;   CARDIAC CATHETERIZATION     CESAREAN SECTION  1994   Dr. Arvil Chaco   COLONOSCOPY WITH PROPOFOL N/A 06/26/2014   Procedure: COLONOSCOPY WITH PROPOFOL;  Surgeon: Elnita Maxwell, MD;  Location: [facility] ENDOSCOPY;  Service: Endoscopy;  Laterality: N/A;   DILATION AND CURETTAGE OF UTERUS  x2 for incomplete SABs   TEE WITHOUT CARDIOVERSION N/A 12/03/2019   Procedure: TRANSESOPHAGEAL ECHOCARDIOGRAM (TEE);  Surgeon: Sande Rives, MD;  Location: [facility] ENDOSCOPY;  Service: Cardiovascular;  Laterality: N/A;   TEE WITHOUT  CARDIOVERSION N/A 03/17/2022   Procedure: TRANSESOPHAGEAL ECHOCARDIOGRAM (TEE);  Surgeon: Thurmon Fair, MD;  Location: [facility] ENDOSCOPY;  Service: Cardiovascular;  Laterality: N/A;     A IV Location/Drains/Wounds Patient Lines/Drains/Airways Status     Active Line/Drains/Airways     Name Placement date Placement time Site Days   Peripheral IV 04/22/22 18 G Right Antecubital 04/22/22  --  Antecubital  1            Intake/Output Last 24 hours No intake or output data in the 24 hours ending 04/23/22 0026  Labs/Imaging Results for orders placed or performed during the hospital encounter of 04/22/22 (from the past 48 hour(s))  Ethanol     Status: None   Collection Time: 04/22/22 10:25 PM  Result Value Ref Range   Alcohol, Ethyl (B) <10 <10 mg/dL    Comment: (NOTE) Lowest detectable limit for serum alcohol is 10 mg/dL.  For medical purposes only. Performed at [facility], [address] [place], [place] [address]   Protime-INR     Status: None   Collection Time: 04/22/22 10:25 PM  Result Value Ref Range   Prothrombin Time 13.3 11.4 - 15.2 seconds   INR 1.0 0.8 - 1.2    Comment: (NOTE) INR goal varies based on device and disease states. Performed at [facility], [address], [place], [place] [address]   APTT     Status: None   Collection Time: 04/22/22 10:25 PM  Result Value Ref Range   aPTT 28 24 - 36 seconds    Comment: Performed at [facility], [address] [place], [place] [address]  CBC     Status: Abnormal   Collection Time: 04/22/22 10:25 PM  Result Value Ref Range   WBC 9.9 4.0 - 10.5 K/uL   RBC 3.31 (L) 3.87 - 5.11 MIL/uL   Hemoglobin 11.8 (L) 12.0 - 15.0 g/dL   HCT 46.9 (L) 62.9 - 52.8 %   MCV 105.1 (H) 80.0 - 100.0 fL   MCH 35.6 (H) 26.0 - 34.0 pg   MCHC 33.9 30.0 - 36.0 g/dL   RDW 41.3 (H) 24.4 - 01.0 %   Platelets 220 150 - 400 K/uL   nRBC 0.0 0.0 - 0.2 %    Comment: Performed at [facility], [address],  [place], [place] [address]  Differential     Status: Abnormal   Collection Time: 04/22/22 10:25 PM  Result Value Ref Range   Neutrophils Relative % 50 %   Neutro Abs 5.0 1.7 - 7.7 K/uL   Lymphocytes Relative 36 %   Lymphs Abs 3.6 0.7 - 4.0 K/uL   Monocytes Relative 6 %   Monocytes Absolute 0.6 0.1 - 1.0 K/uL   Eosinophils Relative 7 %   Eosinophils Absolute 0.7 (H) 0.0 - 0.5 K/uL   Basophils Relative 1 %   Basophils Absolute 0.1 0.0 - 0.1 K/uL  Immature Granulocytes 0 %   Abs Immature Granulocytes 0.02 0.00 - 0.07 K/uL    Comment: Performed at [facility], [address], [place], [place] [address]  Comprehensive metabolic panel     Status: Abnormal   Collection Time: 04/22/22 10:25 PM  Result Value Ref Range   Sodium 140 135 - 145 mmol/L   Potassium 5.2 (H) 3.5 - 5.1 mmol/L   Chloride 107 98 - 111 mmol/L   CO2 19 (L) 22 - 32 mmol/L   Glucose, Bld 103 (H) 70 - 99 mg/dL    Comment: Glucose reference range applies only to samples taken after fasting for at least 8 hours.   BUN 32 (H) 6 - 20 mg/dL   Creatinine, Ser 1.91 (H) 0.44 - 1.00 mg/dL   Calcium 9.3 8.9 - 47.8 mg/dL   Total Protein 6.8 6.5 - 8.1 g/dL   Albumin 3.7 3.5 - 5.0 g/dL   AST 22 15 - 41 U/L   ALT 15 0 - 44 U/L   Alkaline Phosphatase 125 38 - 126 U/L   Total Bilirubin 0.3 0.3 - 1.2 mg/dL   GFR, Estimated 26 (L) >60 mL/min    Comment: (NOTE) Calculated using the CKD-EPI Creatinine Equation (2021)    Anion gap 14 5 - 15    Comment: Performed at [facility], [address], [place], [place] [address]  I-stat chem 8, ED     Status: Abnormal   Collection Time: 04/22/22 10:27 PM  Result Value Ref Range   Sodium 142 135 - 145 mmol/L   Potassium 5.2 (H) 3.5 - 5.1 mmol/L   Chloride 111 98 - 111 mmol/L   BUN 34 (H) 6 - 20 mg/dL   Creatinine, Ser 1.30 (H) 0.44 - 1.00 mg/dL   Glucose, Bld 95 70 - 99 mg/dL    Comment: Glucose reference range applies only to samples taken after fasting for at least 8 hours.    Calcium, Ion 1.20 1.15 - 1.40 mmol/L   TCO2 23 22 - 32 mmol/L   Hemoglobin 12.9 12.0 - 15.0 g/dL   HCT 86.5 78.4 - 69.6 %  Urine rapid drug screen (hosp performed)     Status: None   Collection Time: 04/22/22 10:29 PM  Result Value Ref Range   Opiates NONE DETECTED NONE DETECTED   Cocaine NONE DETECTED NONE DETECTED   Benzodiazepines NONE DETECTED NONE DETECTED   Amphetamines NONE DETECTED NONE DETECTED   Tetrahydrocannabinol NONE DETECTED NONE DETECTED   Barbiturates NONE DETECTED NONE DETECTED    Comment: (NOTE) DRUG SCREEN FOR MEDICAL PURPOSES ONLY.  IF CONFIRMATION IS NEEDED FOR ANY PURPOSE, NOTIFY LAB WITHIN 5 DAYS.  LOWEST DETECTABLE LIMITS FOR URINE DRUG SCREEN Drug Class                     Cutoff (ng/mL) Amphetamine and metabolites    1000 Barbiturate and metabolites    200 Benzodiazepine                 200 Opiates and metabolites        300 Cocaine and metabolites        300 THC                            50 Performed at [facility], [address], [place], [place] [address]   Urinalysis, Routine w reflex microscopic -Urine, Clean Catch  Status: Abnormal   Collection Time: 04/22/22 10:29 PM  Result Value Ref Range   Color, Urine YELLOW YELLOW   APPearance HAZY (A) CLEAR   Specific Gravity, Urine 1.011 1.005 - 1.030   pH 7.0 5.0 - 8.0   Glucose, UA NEGATIVE NEGATIVE mg/dL   Hgb urine dipstick SMALL (A) NEGATIVE   Bilirubin Urine NEGATIVE NEGATIVE   Ketones, ur NEGATIVE NEGATIVE mg/dL   Protein, ur 956 (A) NEGATIVE mg/dL   Nitrite POSITIVE (A) NEGATIVE   Leukocytes,Ua LARGE (A) NEGATIVE   RBC / HPF 0-5 0 - 5 RBC/hpf   WBC, UA >50 0 - 5 WBC/hpf   Bacteria, UA NONE SEEN NONE SEEN   Squamous Epithelial / HPF 0-5 0 - 5 /HPF   Mucus PRESENT     Comment: Performed at [facility], [address] [place], [place] [address]   CT ANGIO HEAD NECK W WO CM (CODE STROKE)  Result Date: 04/22/2022 CLINICAL DATA:  Left-sided deficits EXAM: CT  ANGIOGRAPHY HEAD AND NECK WITH AND WITHOUT CONTRAST TECHNIQUE: Multidetector CT imaging of the head and neck was performed using the standard protocol during bolus administration of intravenous contrast. Multiplanar CT image reconstructions and MIPs were obtained to evaluate the vascular anatomy. Carotid stenosis measurements (when applicable) are obtained utilizing NASCET criteria, using the distal internal carotid diameter as the denominator. RADIATION DOSE REDUCTION: This exam was performed according to the departmental dose-optimization program which includes automated exposure control, adjustment of the mA and/or kV according to patient size and/or use of iterative reconstruction technique. CONTRAST:  75mL OMNIPAQUE IOHEXOL 350 MG/ML SOLN COMPARISON:  None Available. FINDINGS: CTA NECK FINDINGS SKELETON: There is no bony spinal canal stenosis. No lytic or blastic lesion. OTHER NECK: Normal pharynx, larynx and major salivary glands. No cervical lymphadenopathy. Unremarkable thyroid gland. UPPER CHEST: No pneumothorax or pleural effusion. No nodules or masses. AORTIC ARCH: There is no calcific atherosclerosis of the aortic arch. There is no aneurysm, dissection or hemodynamically significant stenosis of the visualized portion of the aorta. Conventional 3 vessel aortic branching pattern. The visualized proximal subclavian arteries are widely patent. RIGHT CAROTID SYSTEM: Normal without aneurysm, dissection or stenosis. LEFT CAROTID SYSTEM: Normal without aneurysm, dissection or stenosis. VERTEBRAL ARTERIES: Left dominant configuration. Both origins are clearly patent. There is no dissection, occlusion or flow-limiting stenosis to the skull base (V1-V3 segments). CTA HEAD FINDINGS POSTERIOR CIRCULATION: --Vertebral arteries: Normal V4 segments. --Inferior cerebellar arteries: Normal. --Basilar artery: Normal. --Superior cerebellar arteries: Normal. --Posterior cerebral arteries (PCA): Normal. ANTERIOR CIRCULATION:  --Intracranial internal carotid arteries: Atherosclerotic calcification of the internal carotid arteries at the skull base without hemodynamically significant stenosis. --Anterior cerebral arteries (ACA): Normal. Both A1 segments are present. Patent anterior communicating artery (a-comm). --Middle cerebral arteries (MCA): Normal. VENOUS SINUSES: As permitted by contrast timing, patent. ANATOMIC VARIANTS: None Review of the MIP images confirms the above findings. IMPRESSION: No emergent large vessel occlusion or hemodynamically significant stenosis of the head or neck. Electronically Signed   By: Deatra Robinson M.D.   On: 04/22/2022 22:50   CT HEAD CODE STROKE WO CONTRAST  Result Date: 04/22/2022 CLINICAL DATA:  Code stroke.  Left-sided neurologic deficit EXAM: CT HEAD WITHOUT CONTRAST TECHNIQUE: Contiguous axial images were obtained from the base of the skull through the vertex without intravenous contrast. RADIATION DOSE REDUCTION: This exam was performed according to the departmental dose-optimization program which includes automated exposure control, adjustment of the mA and/or kV according to patient size and/or use of iterative reconstruction  technique. COMPARISON:  01/09/2022 FINDINGS: Brain: There is no mass, hemorrhage or extra-axial collection. The size and configuration of the ventricles and extra-axial CSF spaces are normal. 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 There is periventricular hypoattenuation compatible with chronic microvascular disease. Vascular: No abnormal hyperdensity of the major intracranial arteries or dural venous sinuses. No intracranial atherosclerosis. Skull: The visualized skull base, calvarium and extracranial soft tissues are normal. Sinuses/Orbits: No fluid levels or advanced mucosal thickening of the visualized paranasal sinuses. No mastoid or middle ear effusion. The orbits are normal. ASPECTS [facility] Stroke Program Early CT Score) - Ganglionic level infarction (caudate,  lentiform nuclei, internal capsule, insula, M1-M3 cortex): 7 - Supraganglionic infarction (M4-M6 cortex): 3 Total score (0-10 with 10 being normal): 10 IMPRESSION: 1. No acute intracranial abnormality. 2. 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 3. ASPECTS is 10. These results were communicated to Dr. Milon Dikes at 10:37 pm on 04/22/2022 by text page via the [facility] messaging system. Electronically Signed   By: Deatra Robinson M.D.   On: 04/22/2022 22:38    Pending Labs Unresulted Labs (From admission, onward)     Start     Ordered   04/23/22 0500  Lipid panel  (Labs)  Tomorrow morning,   R       Comments: Fasting    04/22/22 2239            Vitals/Pain Today's Vitals   04/23/22 0000 04/23/22 0005 04/23/22 0015 04/23/22 0020  BP: (!) 192/69  (!) 185/142   Pulse: 92 83 91 96  Resp:      Temp:      SpO2: 100% 99% 100% 100%  Weight:        Isolation Precautions No active isolations  Medications Medications   stroke: early stages of recovery book (has no administration in time range)  0.9 %  sodium chloride infusion ( Intravenous New Bag/Given 04/22/22 2309)  acetaminophen (TYLENOL) tablet 650 mg (has no administration in time range)    Or  acetaminophen (TYLENOL) 160 MG/5ML solution 650 mg (has no administration in time range)    Or  acetaminophen (TYLENOL) suppository 650 mg (has no administration in time range)  senna-docusate (Senokot-S) tablet 1 tablet (has no administration in time range)  pantoprazole (PROTONIX) injection 40 mg (40 mg Intravenous Given 04/22/22 2308)  labetalol (NORMODYNE) injection 10 mg (has no administration in time range)  clevidipine (CLEVIPREX) infusion 0.5 mg/mL (2 mg/hr Intravenous New Bag/Given 04/22/22 2310)  tenecteplase (TNKASE) injection for Stroke 20 mg (20 mg Intravenous Given 04/22/22 2235)  iohexol (OMNIPAQUE) 350 MG/ML injection 75 mL (75 mLs Intravenous Contrast Given 04/22/22 2242)    Mobility walks     Focused Assessments Neuro  Assessment Handoff:  Swallow screen pass?  Pt has refused   NIH Stroke Scale  Dizziness Present: No Headache Present: Yes Interval: Other (Comment) Level of Consciousness (1a.)   : Alert, keenly responsive LOC Questions (1b. )   : Answers both questions correctly LOC Commands (1c. )   : Performs both tasks correctly Best Gaze (2. )  : Normal Visual (3. )  : No visual loss Facial Palsy (4. )    : Normal symmetrical movements Motor Arm, Left (5a. )   : No drift Motor Arm, Right (5b. ) : No drift Motor Leg, Left (6a. )  : No drift Motor Leg, Right (6b. ) : No drift Limb Ataxia (7. ): Absent Sensory (8. )  : Normal, no sensory loss Best Language (9. )  :  No aphasia Dysarthria (10. ): Normal Extinction/Inattention (11.)   : No Abnormality Complete NIHSS TOTAL: 0 Last date known well: 04/22/22 Last time known well: 2120 Neuro Assessment:   Neuro Checks:   Initial (04/22/22 2222)  Has TPA been given? Yes Temp: 97.9 F (36.6 C) (04/18 2250) BP: 185/142 (04/19 0015) Pulse Rate: 96 (04/19 0020) If patient is a Neuro Trauma and patient is going to OR before floor call report to 4N Charge nurse: [phone] or [phone]   R Recommendations: See Admitting Provider Note  Report given to:   Additional Notes: PT a/o x 4 HTN c/o epigastric pain and stuffy nose got tnkase has no current deficits noted

## 2022-04-23 NOTE — Progress Notes (Signed)
Patient requesting to see doctor. RN entered room and patient began immediately cursing and raising her voice. When asked what was wrong, patient stated "I'm not going to drink sugar free soda. I don't do that at home and I'm going to leave this f*ing hospital if you try to make me drink sugar free." RN educated patient that Dr. Roda Shutters could certainly discuss her diet with her, but that abusive language would not be tolerated. Patient stated "Why not? They do it at [facility]." RN again reiterated that abusive language would not be tolerated at any [facility] campus, and that while patient is at liberty to leave hospital AMA if she would like, if she is going to stay in the ICU to be treated for stroke, she would have to discuss changes to her diet with the physician. Patient acknowledged understanding.

## 2022-04-23 NOTE — Progress Notes (Signed)
STROKE TEAM PROGRESS NOTE   SUBJECTIVE (INTERVAL HISTORY) Her RN is at the bedside.  Overall her condition is completely resolved.  Patient stated that she was having symptoms of left-sided weakness, right facial droop and slurred speech last night, symptoms lasted about for 5 hours and resolved.  He did have a headache after lasting 1 hour.  Patient had several similar transient episode in the past.  However, she does have history of stroke and history of aortic valve disease.  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 Lynnette Caffey cardiology, agree with anticoagulation.  Outpatient follow-up with cardiology for cardiac cath and cardiovascular surgeon for valvular disease management.   OBJECTIVE Temp:  [97.9 F (36.6 C)-98.1 F (36.7 C)] 98 F (36.7 C) (04/19 2000) Pulse Rate:  [50-97] 66 (04/19 2100) Resp:  [10-23] 19 (04/19 2100) BP: (100-192)/(49-162) 158/52 (04/19 2100) SpO2:  [93 %-100 %] 99 % (04/19 2100) Weight:  [79 kg] 79 kg (04/18 2200)  Recent Labs  Lab 04/22/22 2223  GLUCAP 98   Recent Labs  Lab 04/22/22 2225 04/22/22 2227  NA 140 142  K 5.2* 5.2*  CL 107 111  CO2 19*  --   GLUCOSE 103* 95  BUN 32* 34*  CREATININE 2.23* 2.30*  CALCIUM 9.3  --    Recent Labs  Lab 04/22/22 2225  AST 22  ALT 15  ALKPHOS 125  BILITOT 0.3  PROT 6.8  ALBUMIN 3.7   Recent Labs  Lab 04/22/22 2225 04/22/22 2227  WBC 9.9  --   NEUTROABS 5.0  --   HGB 11.8* 12.9  HCT 34.8* 38.0  MCV 105.1*  --   PLT 220  --    No results for input(s): "CKTOTAL", "CKMB", "CKMBINDEX", "TROPONINI" in the last 168 hours. Recent Labs    04/22/22 2225  LABPROT 13.3  INR 1.0   Recent Labs    04/22/22 2229  COLORURINE YELLOW  LABSPEC 1.011  PHURINE 7.0  GLUCOSEU NEGATIVE  HGBUR SMALL*  BILIRUBINUR NEGATIVE  KETONESUR NEGATIVE  PROTEINUR 100*  NITRITE POSITIVE*  LEUKOCYTESUR LARGE*       Component Value Date/Time   CHOL 115 04/23/2022 0332   CHOL  188 01/10/2020 1913   CHOL 123 10/20/2012 0226   TRIG 100 04/23/2022 0332   TRIG 85 10/20/2012 0226   HDL 26 (L) 04/23/2022 0332   HDL 34 (L) 01/10/2020 1913   HDL 20 (L) 10/20/2012 0226   CHOLHDL 4.4 04/23/2022 0332   VLDL 20 04/23/2022 0332   VLDL 17 10/20/2012 0226   LDLCALC 69 04/23/2022 0332   LDLCALC 126 (H) 01/10/2020 1913   LDLCALC 86 10/20/2012 0226   Lab Results  Component Value Date   HGBA1C 5.5 04/23/2022      Component Value Date/Time   LABOPIA NONE DETECTED 04/22/2022 2229   COCAINSCRNUR NONE DETECTED 04/22/2022 2229   COCAINSCRNUR NEGATIVE 07/19/2011 1605   LABBENZ NONE DETECTED 04/22/2022 2229   AMPHETMU NONE DETECTED 04/22/2022 2229   THCU NONE DETECTED 04/22/2022 2229   LABBARB NONE DETECTED 04/22/2022 2229    Recent Labs  Lab 04/22/22 2225  ETH <10    I have personally reviewed the radiological images below and agree with the radiology interpretations.  CT ANGIO HEAD NECK W WO CM (CODE STROKE)  Result Date: 04/22/2022 CLINICAL DATA:  Left-sided deficits EXAM: CT ANGIOGRAPHY HEAD AND NECK WITH AND WITHOUT CONTRAST TECHNIQUE: Multidetector CT imaging of the head and neck was performed using the standard protocol during  bolus administration of intravenous contrast. Multiplanar CT image reconstructions and MIPs were obtained to evaluate the vascular anatomy. Carotid stenosis measurements (when applicable) are obtained utilizing NASCET criteria, using the distal internal carotid diameter as the denominator. RADIATION DOSE REDUCTION: This exam was performed according to the departmental dose-optimization program which includes automated exposure control, adjustment of the mA and/or kV according to patient size and/or use of iterative reconstruction technique. CONTRAST:  75mL OMNIPAQUE IOHEXOL 350 MG/ML SOLN COMPARISON:  None Available. FINDINGS: CTA NECK FINDINGS SKELETON: There is no bony spinal canal stenosis. No lytic or blastic lesion. OTHER NECK: Normal  pharynx, larynx and major salivary glands. No cervical lymphadenopathy. Unremarkable thyroid gland. UPPER CHEST: No pneumothorax or pleural effusion. No nodules or masses. AORTIC ARCH: There is no calcific atherosclerosis of the aortic arch. There is no aneurysm, dissection or hemodynamically significant stenosis of the visualized portion of the aorta. Conventional 3 vessel aortic branching pattern. The visualized proximal subclavian arteries are widely patent. RIGHT CAROTID SYSTEM: Normal without aneurysm, dissection or stenosis. LEFT CAROTID SYSTEM: Normal without aneurysm, dissection or stenosis. VERTEBRAL ARTERIES: Left dominant configuration. Both origins are clearly patent. There is no dissection, occlusion or flow-limiting stenosis to the skull base (V1-V3 segments). CTA HEAD FINDINGS POSTERIOR CIRCULATION: --Vertebral arteries: Normal V4 segments. --Inferior cerebellar arteries: Normal. --Basilar artery: Normal. --Superior cerebellar arteries: Normal. --Posterior cerebral arteries (PCA): Normal. ANTERIOR CIRCULATION: --Intracranial internal carotid arteries: Atherosclerotic calcification of the internal carotid arteries at the skull base without hemodynamically significant stenosis. --Anterior cerebral arteries (ACA): Normal. Both A1 segments are present. Patent anterior communicating artery (a-comm). --Middle cerebral arteries (MCA): Normal. VENOUS SINUSES: As permitted by contrast timing, patent. ANATOMIC VARIANTS: None Review of the MIP images confirms the above findings. IMPRESSION: No emergent large vessel occlusion or hemodynamically significant stenosis of the head or neck. Electronically Signed   By: Deatra Robinson M.D.   On: 04/22/2022 22:50   CT HEAD CODE STROKE WO CONTRAST  Result Date: 04/22/2022 CLINICAL DATA:  Code stroke.  Left-sided neurologic deficit EXAM: CT HEAD WITHOUT CONTRAST TECHNIQUE: Contiguous axial images were obtained from the base of the skull through the vertex without  intravenous contrast. RADIATION DOSE REDUCTION: This exam was performed according to the departmental dose-optimization program which includes automated exposure control, adjustment of the mA and/or kV according to patient size and/or use of iterative reconstruction technique. COMPARISON:  01/09/2022 FINDINGS: Brain: There is no mass, hemorrhage or extra-axial collection. The size and configuration of the ventricles and extra-axial CSF spaces are normal. 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 There is periventricular hypoattenuation compatible with chronic microvascular disease. Vascular: No abnormal hyperdensity of the major intracranial arteries or dural venous sinuses. No intracranial atherosclerosis. Skull: The visualized skull base, calvarium and extracranial soft tissues are normal. Sinuses/Orbits: No fluid levels or advanced mucosal thickening of the visualized paranasal sinuses. No mastoid or middle ear effusion. The orbits are normal. ASPECTS [facility] Stroke Program Early CT Score) - Ganglionic level infarction (caudate, lentiform nuclei, internal capsule, insula, M1-M3 cortex): 7 - Supraganglionic infarction (M4-M6 cortex): 3 Total score (0-10 with 10 being normal): 10 IMPRESSION: 1. No acute intracranial abnormality. 2. 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 3. ASPECTS is 10. These results were communicated to Dr. Milon Dikes at 10:37 pm on 04/22/2022 by text page via the [facility] messaging system. Electronically Signed   By: Deatra Robinson M.D.   On: 04/22/2022 22:38     PHYSICAL EXAM  Temp:  [97.9 F (36.6 C)-98.1 F (36.7 C)] 98 F (36.7  C) (04/19 2000) Pulse Rate:  [50-97] 66 (04/19 2100) Resp:  [10-23] 19 (04/19 2100) BP: (100-192)/(49-162) 158/52 (04/19 2100) SpO2:  [93 %-100 %] 99 % (04/19 2100) Weight:  [79 kg] 79 kg (04/18 2200)  General - Well nourished, well developed, in no apparent distress.  Ophthalmologic - fundi not visualized due to noncooperation.  Cardiovascular - Regular rhythm  and rate.  Mental Status -  Level of arousal and orientation to time, place, and person were intact. Language including expression, naming, repetition, comprehension was assessed and found intact. Fund of Knowledge was assessed and was intact.  Cranial Nerves II - XII - II - chronic right hemianopia III, IV, VI - Extraocular movements intact. V - Facial sensation intact bilaterally. VII - Facial movement intact bilaterally. VIII - Hearing & vestibular intact bilaterally. X - Palate elevates symmetrically. XI - Chin turning & shoulder shrug intact bilaterally. XII - Tongue protrusion intact.  Motor Strength - The patient's strength was normal in all extremities and pronator drift was absent.  Bulk was normal and fasciculations were absent.   Motor Tone - Muscle tone was assessed at the neck and appendages and was normal.  Reflexes - The patient's reflexes were symmetrical in all extremities and she had no pathological reflexes.  Sensory - Light touch, temperature/pinprick were assessed and were symmetrical.    Coordination - The patient had normal movements in the hands and feet with no ataxia or dysmetria.  Tremor was absent.  Gait and Station - deferred.   ASSESSMENT/PLAN Gabrielle Schultz is a 52 y.o. female with history of hypertension, CAD/MI, CKD, smoker, migraine, stroke/TIA admitted for left-sided weakness, right facial droop and slurred speech.  TNK given.  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  Discussed with Dr. Lynnette Caffey cardiology, will start Eliquis after 24 hours of TNK  with no bleeding on MRI. Patient counseled to be compliant with her antithrombotic medications Ongoing aggressive stroke risk factor management Therapy recommendations: None Disposition: Pending  History of stroke History of remote left PCA infarct 11/2019 admitted for right frontal cortical infarct status post tPA.  CT showed chronic left PCA and MCA infarct.  CTA head and neck unremarkable.  EF 65 to 70%.  No DVT.  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  LDL 97, A1c 5.7, UDS negative.  Hypercoag workup negative except homocystine level 52.  Discharged on DAPT and Zocor 40.  Asked her to follow-up with Dr. Bufford Buttner cardiology as outpatient but she has no insurance. 07/2020 admitted for transient right-sided weakness and numbness, proximal TIA 08/2021 admitted for transient bilateral leg weakness, MRI showed possible subacute parietal infarct with evidence of cortical laminar necrosis.  MRA head and neck negative.  30-day CardioNet monitoring negative. 01/2022 admitted for right facial droop, slurred speech and right hemianopia.  MRI negative, CT negative. 02/2022 follow-up with cardiology Dr. Lynnette Caffey as outpatient.  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 Patient has been referred to cardiovascular surgery but was asked to have cardiac cath done first Patient had cardiac cath appointment today but not able to go Discussed with Dr. Lynnette Caffey, will continue outpatient cardiac cath and outpatient referral to cardiovascular surgery.  At the meantime agree with anticoagulation such as Eliquis.  Migraine /  Complicated migraine Patient has history of migraine, with photophobia and phonophobia, needed dark room and sleep Normally lasting less than 3 hours. No specific medication at home. Recurrent transient episode of neurodeficit with headache for the last 2 or 3 years.  Admitted this time also with transient neurodeficit with headache.  Concerning for complicated migraine Recommend outpatient referral to [facility] Dr. Lucia Gaskins or Dr. Delena Bali.  Hypertension Stable on the high end BP goal less than 180/105 after TNK within 24 hours On amlodipine, Lasix and Cozaar Long term BP goal normotensive  Hyperlipidemia Home meds: Lipitor 80 LDL 69, goal < 70 Now on Lipitor 80 Continue statin at discharge  Tobacco abuse Current smoker Smoking cessation counseling provided Nicotine patch declined Pt is not willing to quit  Other Stroke Risk Factors Obesity, Body mass index is 32.91 kg/m.  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 This patient's care requires constant monitoring of vital signs, hemodynamics, respiratory and cardiac monitoring, review of multiple databases, neurological assessment, discussion with family, other specialists and medical decision making of high complexity. I spent 45 minutes of neurocritical care time in the care of this patient.   Marvel Plan, MD PhD Stroke Neurology 04/23/2022 9:40 PM    To contact Stroke Continuity provider, please refer to [email or web address]. After hours, contact General Neurology

## 2022-04-23 NOTE — Progress Notes (Signed)
PT Cancellation Note  Patient Details Name: Gabrielle Schultz MRN: [id number] DOB: July 01, 1970   Cancelled Treatment:    Reason Eval/Treat Not Completed: Active bedrest order; will follow up when activity orders upgraded.   Elray Mcgregor 04/23/2022, 9:07 AM Sheran Lawless, PT Acute Rehabilitation Services [phone] 04/23/2022

## 2022-04-23 NOTE — Progress Notes (Signed)
Pt adamant about keeping personal meds in her purse at bedside. Clonidine 0.3 mg, filled 01/05/22, in prescription bottle with 13 peach colored, 6 white pills. Pt stated "they came that way".  Empty Protonix bottle. OTC 81 mg Aspirin 54 pills. This RN counted with Pasty Arch RN.

## 2022-04-23 NOTE — Evaluation (Signed)
Physical Therapy Evaluation Patient Details Name: Gabrielle Schultz MRN: [id number] DOB: 06-Apr-1970 Today's Date: 04/23/2022  History of Present Illness  Gabrielle Schultz is a 52 y.o. female admitted with L side flaccidity and R facial weakness 04/23/22 and had TNKase administered.  Past medical history of multiple prior strokes, CAD, hypertension, renal insufficiency, colitis, CKD, HTN.  Clinical Impression  Patient presents with decreased mobility due to decreased balance and generalized weakness.  Previously using cane at times when in community, but some limitations in her IADL's.  Currently walking with minguard A in hallway and c/o some imbalance not quite back to normal.  PT will follow acutely to progress high level balance and dynamic gait.  No follow up PT needs at d/c.    Recommendations for follow up therapy are one component of a multi-disciplinary discharge planning process, led by the attending physician.  Recommendations may be updated based on patient status, additional functional criteria and insurance authorization.  Follow Up Recommendations       Assistance Recommended at Discharge Intermittent Supervision/Assistance  Patient can return home with the following  Assistance with cooking/housework;Assist for transportation;Help with stairs or ramp for entrance    Equipment Recommendations None recommended by PT  Recommendations for Other Services       Functional Status Assessment Patient has had a recent decline in their functional status and demonstrates the ability to make significant improvements in function in a reasonable and predictable amount of time.     Precautions / Restrictions Precautions Precautions: Fall      Mobility  Bed Mobility Overal bed mobility: Modified Independent                  Transfers Overall transfer level: Needs assistance Equipment used: None Transfers: Sit to/from Stand Sit to Stand: Min guard           General  transfer comment: for balance, lines, safety    Ambulation/Gait Ambulation/Gait assistance: Min guard Gait Distance (Feet): 400 Feet Assistive device: None Gait Pattern/deviations: Step-through pattern, Decreased stride length, Shuffle       General Gait Details: mild imbalance, walking in flip flops which is her normal footwear; minguard for balance as pt relates not quite at baseline  Careers information officer    Modified Rankin (Stroke Patients Only) Modified Rankin (Stroke Patients Only) Pre-Morbid Rankin Score: Slight disability Modified Rankin: Moderate disability     Balance Overall balance assessment: Needs assistance   Sitting balance-Leahy Scale: Good       Standing balance-Leahy Scale: Good                 High Level Balance Comments: step taps to 6" step x 8 with 1 UE support             Pertinent Vitals/Pain Pain Assessment Pain Assessment: No/denies pain    Home Living Family/patient expects to be discharged to:: Private residence Living Arrangements: Spouse/significant other;Children (and 66 y/o son, his gf and 5y/o daughter) Available Help at Discharge: Family;Available 24 hours/day Type of Home: House Home Access: Stairs to enter   [employer] of Steps: 1   Home Layout: One level Home Equipment: Cane - Programmer, applications (2 wheels);Shower seat      Prior Function Prior Level of Function : Needs assist             Mobility Comments: used cane when out in community, sometimes at home if legs  feel weak ADLs Comments: assist in and out of shower     Hand Dominance   Dominant Hand: Right    Extremity/Trunk Assessment   Upper Extremity Assessment Upper Extremity Assessment: Defer to OT evaluation (some numbness L forearm)    Lower Extremity Assessment Lower Extremity Assessment: Overall WFL for tasks assessed       Communication   Communication: No difficulties  Cognition  Arousal/Alertness: Awake/alert Behavior During Therapy: WFL for tasks assessed/performed Overall Cognitive Status: Within Functional Limits for tasks assessed                                          General Comments General comments (skin integrity, edema, etc.): VSS during session    Exercises     Assessment/Plan    PT Assessment Patient needs continued PT services  PT Problem List Decreased balance;Decreased mobility       PT Treatment Interventions DME instruction;Functional mobility training;Balance training;Patient/family education;Therapeutic activities;Gait training;Therapeutic exercise    PT Goals (Current goals can be found in the Care Plan section)  Acute Rehab PT Goals Patient Stated Goal: to return to independent PT Goal Formulation: With patient Time For Goal Achievement: 04/30/22 Potential to Achieve Goals: Good    Frequency Min 4X/week     Co-evaluation               AM-PAC PT "6 Clicks" Mobility  Outcome Measure Help needed turning from your back to your side while in a flat bed without using bedrails?: None Help needed moving from lying on your back to sitting on the side of a flat bed without using bedrails?: A Little Help needed moving to and from a bed to a chair (including a wheelchair)?: A Little Help needed standing up from a chair using your arms (e.g., wheelchair or bedside chair)?: A Little Help needed to walk in hospital room?: A Little Help needed climbing 3-5 steps with a railing? : Total 6 Click Score: 17    End of Session Equipment Utilized During Treatment: Gait belt Activity Tolerance: Patient tolerated treatment well Patient left: in bed;with call bell/phone within reach   PT Visit Diagnosis: Other abnormalities of gait and mobility (R26.89)    Time: 3664-4034 PT Time Calculation (min) (ACUTE ONLY): 24 min   Charges:   PT Evaluation $PT Eval Low Complexity: 1 Low PT Treatments $Gait Training: 8-22  mins        Sheran Lawless, PT Acute Rehabilitation Services Office:[phone] 04/23/2022   Elray Mcgregor 04/23/2022, 5:14 PM

## 2022-04-23 NOTE — Progress Notes (Signed)
  Transition of Care [facility]) Screening Note   Patient Details  Name: Gabrielle Schultz Date of Birth: 1970/05/01   Transition of Care [facility]) CM/SW Contact:    Mearl Latin, LCSW Phone Number: 04/23/2022, 5:32 PM    Transition of Care Department [facility]) has reviewed patient and no TOC needs have been identified at this time. We will continue to monitor patient advancement through interdisciplinary progression rounds. If new patient transition needs arise, please place a TOC consult.

## 2022-04-24 DIAGNOSIS — I2510 Atherosclerotic heart disease of native coronary artery without angina pectoris: Secondary | ICD-10-CM | POA: Diagnosis present

## 2022-04-24 DIAGNOSIS — I639 Cerebral infarction, unspecified: Secondary | ICD-10-CM | POA: Diagnosis not present

## 2022-04-24 LAB — CBC
HCT: 30.2 % — ABNORMAL LOW (ref 36.0–46.0)
Hemoglobin: 10.4 g/dL — ABNORMAL LOW (ref 12.0–15.0)
MCH: 35.6 pg — ABNORMAL HIGH (ref 26.0–34.0)
MCHC: 34.4 g/dL (ref 30.0–36.0)
MCV: 103.4 fL — ABNORMAL HIGH (ref 80.0–100.0)
Platelets: 211 10*3/uL (ref 150–400)
RBC: 2.92 MIL/uL — ABNORMAL LOW (ref 3.87–5.11)
RDW: 18.7 % — ABNORMAL HIGH (ref 11.5–15.5)
WBC: 8.8 10*3/uL (ref 4.0–10.5)
nRBC: 0 % (ref 0.0–0.2)

## 2022-04-24 LAB — BASIC METABOLIC PANEL
Anion gap: 9 (ref 5–15)
BUN: 35 mg/dL — ABNORMAL HIGH (ref 6–20)
CO2: 20 mmol/L — ABNORMAL LOW (ref 22–32)
Calcium: 8.9 mg/dL (ref 8.9–10.3)
Chloride: 108 mmol/L (ref 98–111)
Creatinine, Ser: 2.09 mg/dL — ABNORMAL HIGH (ref 0.44–1.00)
GFR, Estimated: 29 mL/min — ABNORMAL LOW (ref >60)
Glucose, Bld: 95 mg/dL (ref 70–99)
Potassium: 4.7 mmol/L (ref 3.5–5.1)
Sodium: 137 mmol/L (ref 135–145)

## 2022-04-24 MED ORDER — ASPIRIN 81 MG PO CHEW: 81.0000 mg | CHEWABLE_TABLET | Freq: Every day | ORAL | 0 refills | Status: DC

## 2022-04-24 MED ORDER — TICAGRELOR 90 MG PO TABS: 90.0000 mg | ORAL_TABLET | Freq: Two times a day (BID) | ORAL | 0 refills | Status: DC

## 2022-04-24 MED ORDER — ASPIRIN 81 MG PO CHEW
81.0000 mg | CHEWABLE_TABLET | Freq: Every day | ORAL | Status: DC
  Administered 2022-04-24: 81 mg via ORAL
  Filled 2022-04-24: qty 1

## 2022-04-24 MED ORDER — FUROSEMIDE 20 MG PO TABS: 20.0000 mg | ORAL_TABLET | Freq: Every day | ORAL | 0 refills | Status: DC

## 2022-04-24 MED ORDER — AMLODIPINE BESYLATE 5 MG PO TABS: 5.0000 mg | ORAL_TABLET | Freq: Every day | ORAL | 0 refills | Status: DC

## 2022-04-24 MED ORDER — TICAGRELOR 90 MG PO TABS
90.0000 mg | ORAL_TABLET | Freq: Two times a day (BID) | ORAL | Status: DC
  Administered 2022-04-24: 90 mg via ORAL
  Filled 2022-04-24: qty 1

## 2022-04-24 MED ORDER — CLONIDINE HCL 0.2 MG PO TABS: 0.3000 mg | ORAL_TABLET | Freq: Three times a day (TID) | ORAL | Status: DC

## 2022-04-24 NOTE — Discharge Summary (Addendum)
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  active Problems: Aortic valve calcification with stenosis and regurgitation   Chronic kidney disease   Smoker   Hyperlipidemia   Obesity   Essential hypertension   H/O: stroke   CAD (coronary artery disease) History of multiple strokes  Allergies as of 04/24/2022       Reactions   Coconut (cocos Nucifera) Hives, Swelling   Just coconut ("all coconut")   Amoxicillin Other (See Comments)   "makes me sick" "swelled up everywhere" Has patient had a PCN reaction causing immediate rash, facial/tongue/throat swelling, SOB or lightheadedness with hypotension: No Has patient had a PCN reaction causing severe rash involving mucus membranes or skin necrosis: No Has patient had a PCN reaction that required hospitalization: No Has patient had a PCN reaction occurring within the last 10 years: No If all of the above answers are "NO", then may proceed with Cephalosporin use.   Penicillins Other (See Comments)   "makes me sick" "swelled up oil" Has patient had a PCN reaction causing immediate rash, facial/tongue/throat swelling, SOB or lightheadedness with hypotension: No Has patient had a PCN reaction causing severe rash involving mucus membranes or skin necrosis: No Has patient had a PCN reaction that required hospitalization: No Has patient had a PCN reaction occurring within the last 10 years: Yes If all of the above answers are "NO", then may proceed with Cephalosporin use.   Singulair [montelukast Sodium] Rash   "made heart race"        Medication List     STOP taking these medications    aspirin EC 81 MG  tablet Replaced by: aspirin 81 MG chewable tablet   clopidogrel 75 MG tablet Commonly known as: PLAVIX   pantoprazole 40 MG tablet Commonly known as: PROTONIX       TAKE these medications    acetaminophen 650 MG CR tablet Commonly known as: TYLENOL Take 650-1,300 mg by mouth every 8 (eight) hours as needed for pain.   albuterol 108 (90 Base) MCG/ACT inhaler Commonly known as: Proventil HFA Inhale 2 puffs into the lungs once every 4 (four) hours as needed for wheezing (cough).   allopurinol 100 MG tablet Commonly known as: ZYLOPRIM TAKE ONE TABLET BY MOUTH ONCE DAILY. What changed: how much to take   amLODipine 5 MG tablet Commonly known as: NORVASC Take 1 tablet (5 mg total) by mouth daily. Start taking on: April 25, 2022 What changed:  medication strength how much to take   aspirin 81 MG chewable tablet Chew 1 tablet (81 mg total) by mouth daily. Start taking on: April 25, 2022 Replaces: aspirin EC 81 MG tablet   atorvastatin 80 MG tablet Commonly known as: LIPITOR Take 1 tablet (80 mg total) by mouth daily. What changed: when to take this   cetirizine 10 MG tablet Commonly known as: ZYRTEC TAKE ONE TABLET BY MOUTH ONCE DAILY. What changed: how much to take   cloNIDine 0.3 MG tablet Commonly known as: CATAPRES Take 1 tablet by mouth 3 times daily (morning, noon, and evening).   docusate sodium 100 MG capsule Commonly known as: COLACE Take 100 mg by  mouth in the morning.   fluticasone 50 MCG/ACT nasal spray Commonly known as: FLONASE USE 1 SPRAY INTO BOTH NOSTRILS 2 TIMES A DAY What changed:  how much to take how to take this when to take this   fluticasone-salmeterol 250-50 MCG/ACT Aepb Commonly known as: Wixela Inhub Inhale one puff into the lungs in the morning and at bedtime   furosemide 20 MG tablet Commonly known as: LASIX Take 1 tablet (20 mg total) by mouth daily. Start taking on: April 25, 2022 What changed:  when to take  this reasons to take this   losartan 50 MG tablet Commonly known as: COZAAR Take 1 tablet (50 mg total) by mouth daily. What changed:  how much to take when to take this   omeprazole 20 MG capsule Commonly known as: PRILOSEC Take 1 capsule (20 mg total) by mouth daily before breakfast.   potassium chloride 10 MEQ tablet Commonly known as: KLOR-CON M Take 1 tablet (10 mEq total) by mouth daily. To take whenever you take lasix   promethazine 25 MG tablet Commonly known as: PHENERGAN Take 1 tablet (25 mg total) by mouth as needed for nausea or vomiting.   ticagrelor 90 MG Tabs tablet Commonly known as: BRILINTA Take 1 tablet (90 mg total) by mouth 2 (two) times daily.        LABORATORY STUDIES CBC    Component Value Date/Time   WBC 8.8 04/24/2022 0249   RBC 2.92 (L) 04/24/2022 0249   HGB 10.4 (L) 04/24/2022 0249   HGB 12.3 05/21/2021 1932   HCT 30.2 (L) 04/24/2022 0249   HCT 35.6 05/21/2021 1932   PLT 211 04/24/2022 0249   PLT 242 05/21/2021 1932   MCV 103.4 (H) 04/24/2022 0249   MCV 96 05/21/2021 1932   MCV 93 05/19/2013 0541   MCH 35.6 (H) 04/24/2022 0249   MCHC 34.4 04/24/2022 0249   RDW 18.7 (H) 04/24/2022 0249   RDW 15.4 05/21/2021 1932   RDW 12.4 05/19/2013 0541   LYMPHSABS 3.6 04/22/2022 2225   LYMPHSABS 2.5 05/21/2021 1932   LYMPHSABS 1.1 05/19/2013 0541   MONOABS 0.6 04/22/2022 2225   MONOABS 0.3 05/19/2013 0541   EOSABS 0.7 (H) 04/22/2022 2225   EOSABS 0.4 05/21/2021 1932   EOSABS 0.0 05/19/2013 0541   BASOSABS 0.1 04/22/2022 2225   BASOSABS 0.1 05/21/2021 1932   BASOSABS 0.0 05/19/2013 0541   CMP    Component Value Date/Time   NA 137 04/24/2022 0249   NA 141 11/19/2021 1851   NA 132 (L) 05/20/2013 0508   K 4.7 04/24/2022 0249   K 3.9 05/20/2013 0508   CL 108 04/24/2022 0249   CL 107 05/20/2013 0508   CO2 20 (L) 04/24/2022 0249   CO2 21 05/20/2013 0508   GLUCOSE 95 04/24/2022 0249   GLUCOSE 91 05/20/2013 0508   BUN 35 (H) 04/24/2022  0249   BUN 20 11/19/2021 1851   BUN 16 05/20/2013 0508   CREATININE 2.09 (H) 04/24/2022 0249   CREATININE 1.65 (H) 05/20/2013 0508   CALCIUM 8.9 04/24/2022 0249   CALCIUM 7.9 (L) 05/20/2013 0508   PROT 6.8 04/22/2022 2225   PROT 6.1 11/19/2021 1851   PROT 7.8 05/18/2013 2113   ALBUMIN 3.7 04/22/2022 2225   ALBUMIN 3.7 (L) 11/19/2021 1851   ALBUMIN 3.8 05/18/2013 2113   AST 22 04/22/2022 2225   AST 29 05/18/2013 2113   ALT 15 04/22/2022 2225   ALT 18 05/18/2013 2113   ALKPHOS 125 04/22/2022  2225   ALKPHOS 196 (H) 05/18/2013 2113   BILITOT 0.3 04/22/2022 2225   BILITOT <0.2 11/19/2021 1851   BILITOT 0.5 05/18/2013 2113   GFRNONAA 29 (L) 04/24/2022 0249   GFRNONAA 38 (L) 05/20/2013 0508   GFRAA 35 (L) 01/10/2020 1913   GFRAA 45 (L) 05/20/2013 0508   COAGS Lab Results  Component Value Date   INR 1.0 04/22/2022   INR 1.1 01/09/2022   INR 1.1 10/27/2021   Lipid Panel    Component Value Date/Time   CHOL 115 04/23/2022 0332   CHOL 188 01/10/2020 1913   CHOL 123 10/20/2012 0226   TRIG 100 04/23/2022 0332   TRIG 85 10/20/2012 0226   HDL 26 (L) 04/23/2022 0332   HDL 34 (L) 01/10/2020 1913   HDL 20 (L) 10/20/2012 0226   CHOLHDL 4.4 04/23/2022 0332   VLDL 20 04/23/2022 0332   VLDL 17 10/20/2012 0226   LDLCALC 69 04/23/2022 0332   LDLCALC 126 (H) 01/10/2020 1913   LDLCALC 86 10/20/2012 0226   HgbA1C  Lab Results  Component Value Date   HGBA1C 5.5 04/23/2022   Urinalysis    Component Value Date/Time   COLORURINE YELLOW 04/22/2022 2229   APPEARANCEUR HAZY (A) 04/22/2022 2229   APPEARANCEUR Clear 10/06/2017 1938   LABSPEC 1.011 04/22/2022 2229   LABSPEC 1.006 05/18/2013 2113   PHURINE 7.0 04/22/2022 2229   GLUCOSEU NEGATIVE 04/22/2022 2229   GLUCOSEU Negative 05/18/2013 2113   HGBUR SMALL (A) 04/22/2022 2229   BILIRUBINUR NEGATIVE 04/22/2022 2229   BILIRUBINUR Negative 10/06/2017 1938   BILIRUBINUR Negative 05/18/2013 2113   KETONESUR NEGATIVE 04/22/2022 2229    PROTEINUR 100 (A) 04/22/2022 2229   NITRITE POSITIVE (A) 04/22/2022 2229   LEUKOCYTESUR LARGE (A) 04/22/2022 2229   LEUKOCYTESUR Negative 05/18/2013 2113   Urine Drug Screen     Component Value Date/Time   LABOPIA NONE DETECTED 04/22/2022 2229   COCAINSCRNUR NONE DETECTED 04/22/2022 2229   COCAINSCRNUR NEGATIVE 07/19/2011 1605   LABBENZ NONE DETECTED 04/22/2022 2229   AMPHETMU NONE DETECTED 04/22/2022 2229   THCU NONE DETECTED 04/22/2022 2229   LABBARB NONE DETECTED 04/22/2022 2229    Alcohol Level    Component Value Date/Time   ETH <10 04/22/2022 2225     SIGNIFICANT DIAGNOSTIC STUDIES MR BRAIN WO CONTRAST  Result Date: 04/23/2022 CLINICAL DATA:  Acute neurologic deficit EXAM: MRI HEAD WITHOUT CONTRAST TECHNIQUE: Multiplanar, multiecho pulse sequences of the brain and surrounding structures were obtained without intravenous contrast. COMPARISON:  01/10/2022 FINDINGS: Brain: Acute cortical infarct of the right parietal lobe. No acute hemorrhage. There is multifocal hyperintense T2-weighted signal within the white matter. Generalized volume loss greater than expected for age. Old left PCA and posterior right MCA territory infarcts. Ex vacuo dilatation of the occipital horn of the left lateral ventricle. The midline structures are normal. Vascular: Major flow voids are preserved. Skull and upper cervical spine: Normal calvarium and skull base. Visualized upper cervical spine and soft tissues are normal. Sinuses/Orbits:No paranasal sinus fluid levels or advanced mucosal thickening. No mastoid or middle ear effusion. Normal orbits. IMPRESSION: 1. Acute cortical infarct of the right parietal lobe. No hemorrhage or mass effect. 2. Old left PCA and posterior right MCA territory infarcts. 3. Chronic small vessel disease and age advanced atrophy. Electronically Signed   By: Deatra Robinson M.D.   On: 04/23/2022 23:22   CT ANGIO HEAD NECK W WO CM (CODE STROKE)  Result Date: 04/22/2022 CLINICAL  DATA:  Left-sided deficits EXAM: CT  ANGIOGRAPHY HEAD AND NECK WITH AND WITHOUT CONTRAST TECHNIQUE: Multidetector CT imaging of the head and neck was performed using the standard protocol during bolus administration of intravenous contrast. Multiplanar CT image reconstructions and MIPs were obtained to evaluate the vascular anatomy. Carotid stenosis measurements (when applicable) are obtained utilizing NASCET criteria, using the distal internal carotid diameter as the denominator. RADIATION DOSE REDUCTION: This exam was performed according to the departmental dose-optimization program which includes automated exposure control, adjustment of the mA and/or kV according to patient size and/or use of iterative reconstruction technique. CONTRAST:  75mL OMNIPAQUE IOHEXOL 350 MG/ML SOLN COMPARISON:  None Available. FINDINGS: CTA NECK FINDINGS SKELETON: There is no bony spinal canal stenosis. No lytic or blastic lesion. OTHER NECK: Normal pharynx, larynx and major salivary glands. No cervical lymphadenopathy. Unremarkable thyroid gland. UPPER CHEST: No pneumothorax or pleural effusion. No nodules or masses. AORTIC ARCH: There is no calcific atherosclerosis of the aortic arch. There is no aneurysm, dissection or hemodynamically significant stenosis of the visualized portion of the aorta. Conventional 3 vessel aortic branching pattern. The visualized proximal subclavian arteries are widely patent. RIGHT CAROTID SYSTEM: Normal without aneurysm, dissection or stenosis. LEFT CAROTID SYSTEM: Normal without aneurysm, dissection or stenosis. VERTEBRAL ARTERIES: Left dominant configuration. Both origins are clearly patent. There is no dissection, occlusion or flow-limiting stenosis to the skull base (V1-V3 segments). CTA HEAD FINDINGS POSTERIOR CIRCULATION: --Vertebral arteries: Normal V4 segments. --Inferior cerebellar arteries: Normal. --Basilar artery: Normal. --Superior cerebellar arteries: Normal. --Posterior cerebral arteries  (PCA): Normal. ANTERIOR CIRCULATION: --Intracranial internal carotid arteries: Atherosclerotic calcification of the internal carotid arteries at the skull base without hemodynamically significant stenosis. --Anterior cerebral arteries (ACA): Normal. Both A1 segments are present. Patent anterior communicating artery (a-comm). --Middle cerebral arteries (MCA): Normal. VENOUS SINUSES: As permitted by contrast timing, patent. ANATOMIC VARIANTS: None Review of the MIP images confirms the above findings. IMPRESSION: No emergent large vessel occlusion or hemodynamically significant stenosis of the head or neck. Electronically Signed   By: Deatra Robinson M.D.   On: 04/22/2022 22:50   CT HEAD CODE STROKE WO CONTRAST  Result Date: 04/22/2022 CLINICAL DATA:  Code stroke.  Left-sided neurologic deficit EXAM: CT HEAD WITHOUT CONTRAST TECHNIQUE: Contiguous axial images were obtained from the base of the skull through the vertex without intravenous contrast. RADIATION DOSE REDUCTION: This exam was performed according to the departmental dose-optimization program which includes automated exposure control, adjustment of the mA and/or kV according to patient size and/or use of iterative reconstruction technique. COMPARISON:  01/09/2022 FINDINGS: Brain: There is no mass, hemorrhage or extra-axial collection. The size and configuration of the ventricles and extra-axial CSF spaces are normal. 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 There is periventricular hypoattenuation compatible with chronic microvascular disease. Vascular: No abnormal hyperdensity of the major intracranial arteries or dural venous sinuses. No intracranial atherosclerosis. Skull: The visualized skull base, calvarium and extracranial soft tissues are normal. Sinuses/Orbits: No fluid levels or advanced mucosal thickening of the visualized paranasal sinuses. No mastoid or middle ear effusion. The orbits are normal. ASPECTS [facility] Stroke Program Early CT Score) -  Ganglionic level infarction (caudate, lentiform nuclei, internal capsule, insula, M1-M3 cortex): 7 - Supraganglionic infarction (M4-M6 cortex): 3 Total score (0-10 with 10 being normal): 10 IMPRESSION: 1. No acute intracranial abnormality. 2. 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 3. ASPECTS is 10. These results were communicated to Dr. Milon Dikes at 10:37 pm on 04/22/2022 by text page via the [facility] messaging system. Electronically Signed   By: Chrisandra Netters.D.  On: 04/22/2022 22:38      HISTORY OF PRESENT ILLNESS Gabrielle Schultz is a 52 y.o. female with history of hypertension, CAD/MI, CKD, smoker, migraine, stroke/TIA admitted for left-sided weakness, right facial droop and slurred speech.  TNK given.    HOSPITAL COURSE Acute right parietal ischemic infarct s/p TNK etiology cryptogenic versus paroxysmal A-fib Symptoms resolved in 4 to 5 hours followed by headache lasting 1 hour Patient has similar episode several times in the past CT no acute abnormality, old left PCA infarct CT head and neck unremarkable MRI acute right parietal ischemic infarct Consider loop recorder or 30-day monitoring as outpatient, depending on decision made regarding further treatment of Gabrielle Schultz.  Will need follow-up with CVTS and cardiology as outpatient Follow-up with [facility] in 2 months after discharge LDL 69 HgbA1c 5.5 SCDs for VTE prophylaxis aspirin 81 mg daily and clopidogrel 75 mg daily prior to admission,   will start aspirin 81 mg daily and Brilinta 90 mg twice daily for 1 month then aspirin alone Patient counseled to be compliant with her antithrombotic medications Ongoing aggressive stroke risk factor management Therapy recommendations: None Disposition: Home   History of stroke History of remote left PCA infarct 11/2019 admitted for right frontal cortical infarct status post tPA.  CT showed chronic left PCA and MCA infarct.  CTA head and neck unremarkable.   EF 65 to 70%.  No DVT.  TEE showed degenerative aortic wall versus Gabrielle Schultz versus healed vegetations.  LDL 97, A1c 5.7, UDS negative.  Hypercoag workup negative except homocystine level 52.  Discharged on DAPT and Zocor 40.  Asked her to follow-up with Dr. Bufford Buttner cardiology as outpatient but she has no insurance. 07/2020 admitted for transient right-sided weakness and numbness, proximal TIA 08/2021 admitted for transient bilateral leg weakness, MRI showed possible subacute parietal infarct with evidence of cortical laminar necrosis.  MRA head and neck negative.  30-day CardioNet monitoring negative. 01/2022 admitted for right facial droop, slurred speech and right hemianopia.  MRI negative, CT negative. 02/2022 follow-up with cardiology Dr. Lynnette Caffey as outpatient.  Ordered TTE repeat 03/2022 TEE showed EF 60 to 65%, MV rheumatic, AV post rheumatic, concerning for Gabrielle Schultz.   Questionable Gabrielle Schultz 11/2019 TEE showed degenerative aortic wall versus Gabrielle Schultz versus healed vegetations.  03/2022 TEE showed EF 60 to 65%, MV rheumatic, AV post rheumatic, concerning for Gabrielle Schultz. Patient has been referred to cardiovascular surgery but was asked to have cardiac cath done first Patient had cardiac cath appointment today but not able to go Discussed with Dr. Lynnette Caffey, will continue outpatient cardiac cath and outpatient referral to cardiovascular surgery.  Will discontinue Plavix and change to Brilinta 90 mg twice daily.  Continue aspirin 81 mg daily and Brilinta 90 mg twice daily x 4 weeks and then as per cardiologist Dr.Thukkani.  If patient gets cardiac surgery with mechanical aortic valve switch to anticoagulation if not consider loop recorder for A-fib.   Migraine / Complicated migraine Patient has history of migraine, with photophobia and phonophobia, needed dark room and sleep Normally lasting less than 3 hours. No specific  medication at home. Recurrent transient episode of neurodeficit with headache for the last 2 or 3 years.  Admitted this time also with transient neurodeficit with headache.  Concerning for complicated migraine Follow-up with Dr. Everlena Cooper who is her primary outpatient neurologist   Hypertension Stable on the high end BP goal less than 180/105 after TNK within 24 hours On amlodipine, Lasix and  Cozaar, clonidine Long term BP goal normotensive   Hyperlipidemia Home meds: Lipitor 80 LDL 69, goal < 70 Now on Lipitor 80 Continue statin at discharge   Tobacco abuse Current smoker Smoking cessation counseling provided Nicotine patch declined Pt is not willing to quit   Other Stroke Risk Factors Obesity, Body mass index is 32.91 kg/m.  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 rate 12, weight 79 kg, last menstrual period 08/18/2020, SpO2 99 %.  General - Well nourished, well developed, in no apparent distress.   Cardiovascular - Regular rhythm and rate.   Mental Status -  Level of arousal and orientation to time, place, and person were intact. Language including expression, naming, repetition, comprehension was assessed and found intact. Fund of Knowledge was assessed and was intact.   Cranial Nerves II - XII - II - chronic right hemianopia III, IV, VI - Extraocular movements intact. V - Facial sensation intact bilaterally. VII - Facial movement intact bilaterally. VIII - Hearing & vestibular intact bilaterally. X - Palate elevates symmetrically. XI - Chin turning & shoulder shrug intact bilaterally. XII - Tongue protrusion intact.   Motor Strength - The patient's strength was normal in all extremities and pronator drift was absent.  Bulk was normal and fasciculations were absent.   Motor  Tone - Muscle tone was assessed at the neck and appendages and was normal   Sensory - Light touch, temperature/pinprick were assessed and were symmetrical.     Coordination - The patient had normal movements in the hands and feet with no ataxia or dysmetria.  Tremor was absent.   Gait and Station - deferred.    Discharge Diet       Diet   Diet heart healthy/carb modified Room service appropriate? Yes; Fluid consistency: Thin   liquids  DISCHARGE PLAN Disposition: Home Aspirin 81 mg daily and Brilinta 90 mg twice daily for secondary stroke prevention for 1 month then aspirin 81 mg alone.  If patient has aortic valve replacement by cardiothoracic surgery then may need anticoagulation after that.  If surgery is not indicated will recommend outpatient loop recorder to look for paroxysmal A-fib Ongoing stroke risk factor control by Primary Care Physician at time of discharge Follow-up PCP Sallyanne Kuster, NP in 2 weeks. Follow-up in [facility] in 8 weeks, office to schedule an appointment.  Follow-up with Dr. Everlena Cooper for migraine treatment within 1 month of discharge Follow-up with cardiology within 1 month of discharge Follow-up with cardio vascular thoracic surgery in 1 month of discharge  50 minutes were spent preparing discharge.  Gevena Mart DNP, ACNPC-AG  [facility] I have personally obtained history,examined this patient, reviewed notes, independently viewed imaging studies, participated in medical decision making and plan of care.ROS completed by me personally and pertinent positives fully documented  I have made any additions or clarifications directly to the above note. Agree with note above.    Delia Heady, MD Medical Director [facility] Pager: [phone] 04/24/2022 2:00 PM

## 2022-04-26 ENCOUNTER — Telehealth: Payer: Self-pay | Admitting: Anesthesiology

## 2022-04-26 NOTE — Telephone Encounter (Signed)
Pt called stating she was just in hospital for a stroke, pt advised to follow up with Neurologist as soon as possible.

## 2022-04-27 ENCOUNTER — Telehealth: Payer: Self-pay | Admitting: *Deleted

## 2022-04-27 ENCOUNTER — Telehealth: Payer: Self-pay

## 2022-04-27 NOTE — Patient Outreach (Signed)
  Emmi Stroke Care Coordination Follow Up  04/27/2022 Name:  Gabrielle Schultz MRN:  [id number] DOB:  01-Apr-1970  Subjective: Gabrielle Schultz is a 52 y.o. year old female who is a primary care patient of Abernathy, Arlyss Repress, NP An Emmi alert was received indicating patient responded to questions: Questions/problems with meds?. I reached out by phone to follow up on the alert and spoke to Patient.  Patient states she is doing fairly well. She still has some "loss of feeling to left hand." She is aware that it may take weeks to months ot regain feeling/strength to affected area. She has supportive family assisting her. Reviewed and addressed red alert. Patient states she stopped taking Brilinta as it made her sick-was "causing her to have headaches and chest hurt." She voices she dicussed this with Dr. Adriana Mccallum and he is aware. She has follow up appt scheduled with him. She has not made PCP appt-was not are she needed to follow up but will call office today. She voices that family takes her to appts. No further RN CM needs or concerns at this time.   Care Coordination Interventions:  Yes, provided   TOC Interventions Today    Flowsheet Row Most Recent Value  TOC Interventions   TOC Interventions Discussed/Reviewed TOC Interventions Discussed      Interventions Today    Flowsheet Row Most Recent Value  Chronic Disease   Chronic disease during today's visit Other, Hypertension (HTN)  [stroke]  General Interventions   General Interventions Discussed/Reviewed General Interventions Discussed, Doctor Visits  Doctor Visits Discussed/Reviewed PCP, Doctor Visits Discussed, Specialist  PCP/Specialist Visits Compliance with follow-up visit  Education Interventions   Education Provided Provided Education  Provided Verbal Education On Nutrition, When to see the doctor, Medication, Other  [BP mgmt]  Nutrition Interventions   Nutrition Discussed/Reviewed Nutrition Discussed, Adding fruits and vegetables,  Increaing proteins, Decreasing salt, Fluid intake  Pharmacy Interventions   Pharmacy Dicussed/Reviewed Pharmacy Topics Discussed, Medications and their functions  Safety Interventions   Safety Discussed/Reviewed Safety Discussed, Home Safety  [pt states she has trouble cooking following stroke-she will discuss with provider possible referral to OT]        Follow up plan: Advised patient that they would continue to get automated EMMI-Stroke post discharge calls to assess how they are doing following recent hospitalization and will receive a call from a nurse if any of their responses were abnormal. Patient voiced understanding and was appreciative of f/u call.   Encounter Outcome:  Pt. Visit Completed    Alessandra Grout [facility] Care Management Care Management Community Coordinator Direct Phone: [phone] Toll Free: [phone] Fax: [phone]

## 2022-04-27 NOTE — Patient Outreach (Signed)
Received a red flag Emmi stroke notification for [name] . I have assigned Antionette Fairy, RN to call for follow up and determine if there are any Case Management needs.    Iverson Alamin, Donivan Scull [facility] Care Management Assistant [facility] Care Management [phone]

## 2022-04-27 NOTE — Telephone Encounter (Signed)
-----   Message from Cadence H Furth, PA-C sent at 04/26/2022  2:23 PM EDT ----- See message below, thanks  ----- Message ----- From: Brown, Lauren W, RN Sent: 04/26/2022   9:12 AM EDT To: Cadence H Furth, PA-C  Hey Cadence,  This pt was scheduled to see you last week as an OP to get cardiac cath arranged at [facility].  Unfortunately, she was admitted due to CVA and was discharged over the weekend.  Can you forward to the staff in [place] to arrange post hospital cardiology f/u? She was undergoing evaluation for open heart surgery.   Thank you, Lauren   

## 2022-04-27 NOTE — Telephone Encounter (Signed)
Do you mind reaching out to the patient to schedule a sooner rather than later follow up with Dr. Mariah Milling or Cadence to discuss a heart cath.  The patient was made aware of this already by Julieta Gutting, RN.

## 2022-04-28 ENCOUNTER — Telehealth: Payer: Self-pay | Admitting: Medical

## 2022-04-28 NOTE — Telephone Encounter (Signed)
-----   Message from Cadence David Stall, PA-C sent at 04/26/2022  2:23 PM EDT ----- See message below, thanks  ----- Message ----- From: Iona Coach, RN Sent: 04/26/2022   9:12 AM EDT To: Marianne Sofia, PA-C  Hey Cadence,  This pt was scheduled to see you last week as an OP to get cardiac cath arranged at [facility].  Unfortunately, she was admitted due to CVA and was discharged over the weekend.  Can you forward to the staff in [place] to arrange post hospital cardiology f/u? She was undergoing evaluation for open heart surgery.   Thank you, Leotis Shames

## 2022-04-28 NOTE — Telephone Encounter (Signed)
Left msg to confirm appt for 4/30

## 2022-04-29 ENCOUNTER — Encounter: Payer: Self-pay | Admitting: Neurology

## 2022-05-04 ENCOUNTER — Telehealth: Payer: Self-pay | Admitting: Nurse Practitioner

## 2022-05-04 ENCOUNTER — Ambulatory Visit: Payer: Medicaid Other | Attending: Medical | Admitting: Medical

## 2022-05-04 ENCOUNTER — Ambulatory Visit: Payer: Medicaid Other | Admitting: Nurse Practitioner

## 2022-05-04 ENCOUNTER — Encounter: Payer: Self-pay | Admitting: Medical

## 2022-05-04 ENCOUNTER — Encounter: Payer: Self-pay | Admitting: Nurse Practitioner

## 2022-05-04 ENCOUNTER — Other Ambulatory Visit
Admission: RE | Admit: 2022-05-04 | Discharge: 2022-05-04 | Disposition: A | Discharge status: Home / Self Care | Payer: Medicaid Other | Source: Ambulatory Visit | Attending: Medical | Admitting: Medical

## 2022-05-04 VITALS — BP 146/71 | HR 79 | Temp 98.0°F | Resp 16 | Ht 61.0 in | Wt 163.4 lb

## 2022-05-04 VITALS — BP 142/68 | HR 75 | Ht 61.0 in | Wt 163.0 lb

## 2022-05-04 DIAGNOSIS — Z8673 Personal history of transient ischemic attack (TIA), and cerebral infarction without residual deficits: Secondary | ICD-10-CM

## 2022-05-04 DIAGNOSIS — E782 Mixed hyperlipidemia: Secondary | ICD-10-CM

## 2022-05-04 DIAGNOSIS — I69398 Other sequelae of cerebral infarction: Secondary | ICD-10-CM | POA: Diagnosis not present

## 2022-05-04 DIAGNOSIS — B372 Candidiasis of skin and nail: Secondary | ICD-10-CM

## 2022-05-04 DIAGNOSIS — R200 Anesthesia of skin: Secondary | ICD-10-CM | POA: Diagnosis not present

## 2022-05-04 DIAGNOSIS — R269 Unspecified abnormalities of gait and mobility: Secondary | ICD-10-CM | POA: Diagnosis not present

## 2022-05-04 DIAGNOSIS — Z79899 Other long term (current) drug therapy: Secondary | ICD-10-CM | POA: Diagnosis not present

## 2022-05-04 DIAGNOSIS — I350 Nonrheumatic aortic (valve) stenosis: Secondary | ICD-10-CM

## 2022-05-04 DIAGNOSIS — I10 Essential (primary) hypertension: Secondary | ICD-10-CM | POA: Diagnosis not present

## 2022-05-04 DIAGNOSIS — R209 Unspecified disturbances of skin sensation: Secondary | ICD-10-CM | POA: Diagnosis not present

## 2022-05-04 DIAGNOSIS — Z09 Encounter for follow-up examination after completed treatment for conditions other than malignant neoplasm: Secondary | ICD-10-CM

## 2022-05-04 DIAGNOSIS — I639 Cerebral infarction, unspecified: Secondary | ICD-10-CM | POA: Diagnosis not present

## 2022-05-04 LAB — BASIC METABOLIC PANEL
Anion gap: 9 (ref 5–15)
BUN: 35 mg/dL — ABNORMAL HIGH (ref 6–20)
CO2: 22 mmol/L (ref 22–32)
Calcium: 9.4 mg/dL (ref 8.9–10.3)
Chloride: 109 mmol/L (ref 98–111)
Creatinine, Ser: 2.14 mg/dL — ABNORMAL HIGH (ref 0.44–1.00)
GFR, Estimated: 28 mL/min — ABNORMAL LOW (ref >60)
Glucose, Bld: 105 mg/dL — ABNORMAL HIGH (ref 70–99)
Potassium: 4.5 mmol/L (ref 3.5–5.1)
Sodium: 140 mmol/L (ref 135–145)

## 2022-05-04 MED ORDER — PANTOPRAZOLE SODIUM 40 MG PO TBEC
40.0000 mg | DELAYED_RELEASE_TABLET | Freq: Two times a day (BID) | ORAL | 2 refills | Status: DC
Start: 2022-05-04 — End: 2022-09-22

## 2022-05-04 MED ORDER — FLUCONAZOLE 150 MG PO TABS: 150.0000 mg | ORAL_TABLET | Freq: Once | ORAL | 0 refills | Status: AC

## 2022-05-04 MED ORDER — ATORVASTATIN CALCIUM 80 MG PO TABS: 80.0000 mg | ORAL_TABLET | Freq: Every day | ORAL | 1 refills | Status: DC

## 2022-05-04 MED ORDER — AMLODIPINE BESYLATE 5 MG PO TABS: 5.0000 mg | ORAL_TABLET | Freq: Every day | ORAL | 0 refills | Status: DC

## 2022-05-04 MED ORDER — NYSTATIN 100000 UNIT/GM EX POWD
1.0000 | Freq: Three times a day (TID) | CUTANEOUS | 5 refills | Status: DC
Start: 2022-05-04 — End: 2022-09-22

## 2022-05-04 MED ORDER — CLONIDINE HCL 0.3 MG PO TABS
0.3000 mg | ORAL_TABLET | Freq: Three times a day (TID) | ORAL | 0 refills | Status: AC
Start: 2022-05-04 — End: ?

## 2022-05-04 MED ORDER — CETIRIZINE HCL 10 MG PO TABS: 10.0000 mg | ORAL_TABLET | Freq: Every day | ORAL | 1 refills | Status: DC

## 2022-05-04 MED ORDER — CLOPIDOGREL BISULFATE 75 MG PO TABS
75.0000 mg | ORAL_TABLET | Freq: Every day | ORAL | 1 refills | Status: DC
Start: 2022-05-04 — End: 2022-07-28

## 2022-05-04 NOTE — Progress Notes (Unsigned)
Cardiology Office Note:    Date:  05/05/2022   ID:  Gabrielle Schultz, DOB [birth year], MRN [id number]  PCP:  Sallyanne Kuster, NP  [facility] Cardiologist:  Julien Nordmann, MD  [facility] Electrophysiologist:  None   Referring MD: Sallyanne Kuster, NP   Chief Complaint: Hospital follow-up  History of Present Illness:    Gabrielle Schultz is a 52 y.o. female with a hx of HTN, recurrent strokes, CKD, HTN, PVD, tobacco use who is being seen for hospital follow-up.    Patient was seen in 2013 with nausea, right-sided facial tingling and numbness found to have severe hypertension.  Exercise stress test in 2017 was normal with no evidence of ischemia, mildly impaired exercise capacity.  Echo 2018 showed normal LVEF, severe AI, moderate MR, moderate TR.   Prior echocardiogram showed moderate aortic valve regurgitation.   Echo TEE in November 2021 for stroke showed type III diastolic dysfunction, moderate MR, no mobile elements, possible endocarditis versus healed vegetations, moderate aortic regurgitation, normal RV function, trivial MR, negative bubble study.   Patient has a history of multiple strokes. Patient was admitted for stroke in August 2023.  Echo 08/20/21 showed LVEF 60-65%, no WMA, mild LVH, G1DD, mildly dilated LA, moderate AI. Heart monitor at discharge showed NSR, 23 runs of NSVT longest 20.8 seconds, 10 runs of SVT, rare ectopy, no triggered events.  No A-fib noted.   The patient was seen in there ER 11/2021 chest pain that lasted about 15 minutes. BP was severely elevated. HS troponin went up to 125. A Myoview Lexiscan was ordered. This showed no significant ischemia, normal wall motion, EF 50%, trace aortic atherosclerosis, no significant coronary calcification, low risk scan.   Patient was admitted for stroke in January 2024. Echo at that time showed LVEF 60-65%, mild LVH, , mild to moderate MR, moderate to severe AS.   The patient was subsequently seen in the office  02/05/22 and was referred to structural heart team for aortic valve work-up. The patient saw structural heart team and plan was for aortic valve intervention pre-work up. She underwent TEE and plan was for coronary angiography, CTA and cardiothoracic surgical consult.    The patient was admitted for stroke 4/18/-4/20.   Today, the patient reports she is back home. Says she has minimal feelings in her hands, which is residual from most recent stroke. She will start PT and OT. She denies chest pain or shortness of breath. Has unchanged lower left swelling. Patient needs a referral to EP for IRL.   Past Medical History:  Diagnosis Date   Acid reflux    Allergic rhinitis 06/26/2015   Allergy    Asthma    Carpal tunnel syndrome    right hand   Chronic kidney disease    Colitis    CVA (cerebral infarction)    2009, 2015   Gout    History of syncope    Hypertension 2008   Low HDL (under 40) 06/26/2015   MI (myocardial infarction) (HCC)    2015 - patient wasnt told by cardiologist that she had MI but has had cardiac cath in past   Migraines    Poor circulation    Renal insufficiency    Stroke [facility])    Syncope and collapse     Past Surgical History:  Procedure Laterality Date   BUBBLE STUDY  12/03/2019   Procedure: BUBBLE STUDY;  Surgeon: Sande Rives, MD;  Location: [facility] ENDOSCOPY;  Service: Cardiovascular;;   BUBBLE  STUDY  03/17/2022   Procedure: BUBBLE STUDY;  Surgeon: Thurmon Fair, MD;  Location: [facility] ENDOSCOPY;  Service: Cardiovascular;;   CARDIAC CATHETERIZATION     CESAREAN SECTION  1994   Dr. Arvil Chaco   COLONOSCOPY WITH PROPOFOL N/A 06/26/2014   Procedure: COLONOSCOPY WITH PROPOFOL;  Surgeon: Elnita Maxwell, MD;  Location: [facility] ENDOSCOPY;  Service: Endoscopy;  Laterality: N/A;   DILATION AND CURETTAGE OF UTERUS  x2 for incomplete SABs   TEE WITHOUT CARDIOVERSION N/A 12/03/2019   Procedure: TRANSESOPHAGEAL ECHOCARDIOGRAM (TEE);  Surgeon: Sande Rives, MD;   Location: [facility] ENDOSCOPY;  Service: Cardiovascular;  Laterality: N/A;   TEE WITHOUT CARDIOVERSION N/A 03/17/2022   Procedure: TRANSESOPHAGEAL ECHOCARDIOGRAM (TEE);  Surgeon: Thurmon Fair, MD;  Location: [facility] ENDOSCOPY;  Service: Cardiovascular;  Laterality: N/A;    Current Medications: No outpatient medications have been marked as taking for the 05/04/22 encounter (Office Visit) with Fransico Michael, Nathania Waldman H, PA-C.     Allergies:   Coconut (cocos nucifera), Amoxicillin, Penicillins, and Singulair [montelukast sodium]   Social History   Socioeconomic History   Marital status: Married    Spouse name: Not on file   Number of children: 2   Years of education: 11th grade   Highest education level: 11th grade  Occupational History   Occupation: unemployed  Tobacco Use   Smoking status: Every Day    Packs/day: 0.50    Years: 33.00    Additional pack years: 0.00    Total pack years: 16.50    Types: Cigarettes    Start date: 03/04/2017   Smokeless tobacco: Never  Vaping Use   Vaping Use: Never used  Substance and Sexual Activity   Alcohol use: Yes    Comment: rare- drinks an occassional wine cooler   Drug use: No   Sexual activity: Yes    Partners: Male    Birth control/protection: None  Other Topics Concern   Not on file  Social History Narrative   Husband is a Naval architect for [employer] and is the sole provider for the family including 2 children (17 yr old and 1 yr old)    Goes to food bank for food. Says "really worried" about water and light bill. Husband, baby, 38 year old, her, and mother all live together.    Patient would like appointment with [facility].       Pt is right handed   Social Determinants of Health   Financial Resource Strain: High Risk (08/02/2017)   Overall Financial Resource Strain (CARDIA)    Difficulty of Paying Living Expenses: Hard  Food Insecurity: Food Insecurity Present (04/23/2022)   Hunger Vital Sign    Worried About Running Out of Food in the Last  Year: Often true    Ran Out of Food in the Last Year: Often true  Transportation Needs: Unmet Transportation Needs (04/27/2022)   PRAPARE - Administrator, Civil Service (Medical): Yes    Lack of Transportation (Non-Medical): Yes  Physical Activity: Insufficiently Active (07/25/2018)   Exercise Vital Sign    Days of Exercise per Week: 2 days    Minutes of Exercise per Session: 60 min  Stress: No Stress Concern Present (04/20/2022)   [employer] of Occupational Health - Occupational Stress Questionnaire    Feeling of Stress : Only a little  Social Connections: Somewhat Isolated (08/02/2017)   Social Connection and Isolation Panel [NHANES]    Frequency of Communication with Friends and Family: More than three times a week  Frequency of Social Gatherings with Friends and Family: Never    Attends Religious Services: Never    Database administrator or Organizations: No    Attends Engineer, structural: Never    Marital Status: Married     Family History: The patient's family history includes COPD in her mother; Coronary artery disease in her brother; Diabetes in her child; Healthy in her child; Heart disease in her brother, father, and mother; Hyperlipidemia in her brother, father, and mother; Hypertension in her brother, father, and sister; Kidney disease in her sister.  ROS:   Please see the history of present illness.     All other systems reviewed and are negative.  EKGs/Labs/Other Studies Reviewed:    The following studies were reviewed today:  Echo TEE 03/17/22 1. Left ventricular ejection fraction, by estimation, is 60 to 65%. The  left ventricle has normal function. The left ventricle has no regional  wall motion abnormalities. Left ventricular diastolic parameters are  consistent with Grade II diastolic  dysfunction (pseudonormalization). Elevated left atrial pressure.   2. Right ventricular systolic function is normal. The right ventricular  size is  normal.   3. Left atrial size was moderately dilated. No left atrial/left atrial  appendage thrombus was detected.   4. The mitral valve is rheumatic. Mild to moderate mitral valve  regurgitation.   5. The aortic valve is trileaflet with commisural thickening and  nodularity suggestive of post-inflammatory or rheumatic changes. 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 There is near-holodiastolic reversal of flow in  the descending aorta. 3D guided ERO  area is 0.4 cm sq. The AR vena contracta is 6 mm. The AR pressure half  time is 286 ms. All these values are at least borderline for severe AR.Marland Kitchen  The aortic valve is tricuspid. There is mild calcification of the aortic  valve. There is moderate thickening  of the aortic valve. Aortic valve regurgitation is moderate to severe.  Mild aortic valve stenosis. Aortic valve mean gradient measures 11.0 mmHg.  Aortic valve Vmax measures 2.34 m/s. Aortic valve acceleration time  measures 66 msec.   6. Agitated saline contrast bubble study was negative, with no evidence  of any interatrial shunt.    Echo 01/10/22 1. Left ventricular ejection fraction, by estimation, is 60 to 65%. The  left ventricle has normal function. The left ventricle has no regional  wall motion abnormalities. The left ventricular internal cavity size was  mildly dilated. There is mild  concentric left ventricular hypertrophy. Left ventricular diastolic  parameters are indeterminate. The average left ventricular global  longitudinal strain is -18.8 %. The global longitudinal strain is normal.   2. Right ventricular systolic function is normal. The right ventricular  size is normal. Tricuspid regurgitation signal is inadequate for assessing  PA pressure.   3. The mitral valve is degenerative. Mild to moderate mitral valve  regurgitation. No evidence of mitral stenosis.   4. The aortic valve has an indeterminant number of cusps. There is  moderate calcification of the aortic  valve. There is moderate thickening  of the aortic valve. Aortic valve regurgitation is moderate to severe.  Mild to moderate aortic valve stenosis.  Aortic regurgitation PHT measures 381 msec. Aortic valve area, by VTI  measures 1.47 cm. Aortic valve mean gradient measures 16.6 mmHg. Aortic  valve Vmax measures 3.03 m/s.   5. The inferior vena cava is normal in size with greater than 50%  respiratory variability, suggesting right atrial pressure of 3 mmHg.  6. Consider further workup with TEE to assess AI further.    EKG:  EKG is  ordered today.  The ekg ordered today demonstrates NSR, 75bpm, LAD, LVH with repol, TWI lateral leads  Recent Labs: 08/19/2021: TSH 1.356 12/03/2021: Magnesium 1.9 04/22/2022: ALT 15 04/24/2022: Hemoglobin 10.4; Platelets 211 05/04/2022: BUN 35; Creatinine, Ser 2.14; Potassium 4.5; Sodium 140  Recent Lipid Panel    Component Value Date/Time   CHOL 115 04/23/2022 0332   CHOL 188 01/10/2020 1913   CHOL 123 10/20/2012 0226   TRIG 100 04/23/2022 0332   TRIG 85 10/20/2012 0226   HDL 26 (L) 04/23/2022 0332   HDL 34 (L) 01/10/2020 1913   HDL 20 (L) 10/20/2012 0226   CHOLHDL 4.4 04/23/2022 0332   VLDL 20 04/23/2022 0332   VLDL 17 10/20/2012 0226   LDLCALC 69 04/23/2022 0332   LDLCALC 126 (H) 01/10/2020 1913   LDLCALC 86 10/20/2012 0226    Physical Exam:    VS:  BP (!) 142/68 (BP Location: Left Arm, Patient Position: Sitting, Cuff Size: Normal)   Pulse 75   Ht 5\' 1"  (1.549 m)   Wt 163 lb (73.9 kg)   LMP 08/18/2020 (Approximate) Comment: Tubal ligation  SpO2 100%   BMI 30.80 kg/m     Wt Readings from Last 3 Encounters:  05/04/22 163 lb (73.9 kg)  05/04/22 163 lb 6.4 oz (74.1 kg)  04/22/22 174 lb 2.6 oz (79 kg)     GEN:  Well nourished, well developed in no acute distress HEENT: Normal NECK: No JVD; No carotid bruits LYMPHATICS: No lymphadenopathy CARDIAC: RRR, + murmur, no rubs, gallops RESPIRATORY:  Clear to auscultation without rales,  wheezing or rhonchi  ABDOMEN: Soft, non-tender, non-distended MUSCULOSKELETAL:  No edema; No deformity  SKIN: Warm and dry NEUROLOGIC:  Alert and oriented x 3 PSYCHIATRIC:  Normal affect   ASSESSMENT:    1. Aortic valve stenosis, etiology of cardiac valve disease unspecified   2. History of stroke   3. Essential hypertension    PLAN:    In order of problems listed above:  Severe Aortic valve stenosis she is undergoing aortic valve work-up with the structural heart team. So far she has undergone TEE. She is needing a coronary angiography and is wanting it here in [place]. I will wait on scheduling for heart cath given recent stroke. We will refer back to heart valve team for further work-up. Patient is taking lasix 20mg  daily, BMET today given CKD stage 3.   H/o multiple strokes 2 strokes sine the beginning of the year. Negative bubble study in 2023. I will refer to EP for ILR. Continue Aspirin, lavix and statin therapy.   HTN BP is mildly elevated. She is taking Amlodipine 81mg  daily, clonidine 0.3mg  TID, and Losartan 25mg  daily. No changes for today.   Disposition: Follow up in 3 month(s) with MD/APP    Signed, Gennett Garcia David Stall, PA-C  05/05/2022 9:10 AM     [facility]

## 2022-05-04 NOTE — Telephone Encounter (Signed)
Sent PT & OT referral to [facility] on [address] via Epic since they do both as same location and accept [employer]. Tele # [phone]

## 2022-05-04 NOTE — Patient Instructions (Signed)
Medication Instructions:   Your physician recommends that you continue on your current medications as directed. Please refer to the Current Medication list given to you today.  *If you need a refill on your cardiac medications before your next appointment, please call your pharmacy*   Lab Work:  Your physician recommends you go to the medical mall for lab work.   If you have labs (blood work) drawn today and your tests are completely normal, you will receive your results only by: MyChart Message (if you have MyChart) OR A paper copy in the mail If you have any lab test that is abnormal or we need to change your treatment, we will call you to review the results.   Testing/Procedures:  None Ordered   Follow-Up: At [facility], you and your health needs are our priority.  As part of our continuing mission to provide you with exceptional heart care, we have created designated Provider Care Teams.  These Care Teams include your primary Cardiologist (physician) and Advanced Practice Providers (APPs -  Physician Assistants and Nurse Practitioners) who all work together to provide you with the care you need, when you need it.  We recommend signing up for the patient portal called "MyChart".  Sign up information is provided on this After Visit Summary.  MyChart is used to connect with patients for Virtual Visits (Telemedicine).  Patients are able to view lab/test results, encounter notes, upcoming appointments, etc.  Non-urgent messages can be sent to your provider as well.   To learn more about what you can do with MyChart, go to [email or web address].    Your next appointment:   3 month(s)  Provider:   Julien Nordmann, MD

## 2022-05-04 NOTE — Progress Notes (Signed)
[facility] [place], [place] [address] [place] [address] [phone]                                   Transitional Care Clinic   [facility] Discharge Acute Issues Care Follow Up                                                                        Patient Demographics  Gabrielle Schultz, is a 52 y.o. female  DOB [birth year]  MRN [id number].  Primary MD  Sallyanne Kuster, NP  Date of Admission: 04/22/2022 Date of Discharge: 04/24/2022  Reason for TCC follow Up - ischemic stroke    Past Medical History:  Diagnosis Date   Acid reflux    Allergic rhinitis 06/26/2015   Allergy    Asthma    Carpal tunnel syndrome    right hand   Chronic kidney disease    Colitis    CVA (cerebral infarction)    2009, 2015   Gout    History of syncope    Hypertension 2008   Low HDL (under 40) 06/26/2015   MI (myocardial infarction) (HCC)    2015 - patient wasnt told by cardiologist that she had MI but has had cardiac cath in past   Migraines    Poor circulation    Renal insufficiency    Stroke [facility])    Syncope and collapse     Past Surgical History:  Procedure Laterality Date   BUBBLE STUDY  12/03/2019   Procedure: BUBBLE STUDY;  Surgeon: Sande Rives, MD;  Location: [facility] ENDOSCOPY;  Service: Cardiovascular;;   BUBBLE STUDY  03/17/2022   Procedure: BUBBLE STUDY;  Surgeon: Thurmon Fair, MD;  Location: [facility] ENDOSCOPY;  Service: Cardiovascular;;   CARDIAC CATHETERIZATION     CESAREAN SECTION  1994   Dr. Arvil Chaco   COLONOSCOPY WITH PROPOFOL N/A 06/26/2014   Procedure: COLONOSCOPY WITH PROPOFOL;  Surgeon: Elnita Maxwell, MD;  Location: [facility] ENDOSCOPY;  Service: Endoscopy;  Laterality: N/A;   DILATION AND CURETTAGE OF UTERUS  x2 for incomplete SABs   TEE WITHOUT CARDIOVERSION N/A 12/03/2019   Procedure: TRANSESOPHAGEAL ECHOCARDIOGRAM (TEE);  Surgeon: Sande Rives, MD;  Location: [facility] ENDOSCOPY;  Service: Cardiovascular;  Laterality: N/A;    TEE WITHOUT CARDIOVERSION N/A 03/17/2022   Procedure: TRANSESOPHAGEAL ECHOCARDIOGRAM (TEE);  Surgeon: Thurmon Fair, MD;  Location: [facility] ENDOSCOPY;  Service: Cardiovascular;  Laterality: N/A;       Recent HPI and Hospital Course  HISTORY OF PRESENT ILLNESS Gabrielle Schultz is a 52 y.o. female with history of hypertension, CAD/MI, CKD, smoker, migraine, stroke/TIA admitted for left-sided weakness, right facial droop and slurred speech.  TNK given.     HOSPITAL COURSE Acute right parietal ischemic infarct s/p TNK etiology cryptogenic versus paroxysmal A-fib Symptoms resolved in 4 to 5 hours followed by headache lasting 1 hour Patient has similar episode several times in the past CT no acute abnormality, old left PCA infarct CT head and neck unremarkable MRI acute right parietal ischemic infarct Consider loop recorder or 30-day monitoring as outpatient, depending on decision made regarding further treatment of Libman-Sacks endocarditis.  Will need follow-up with CVTS and cardiology as outpatient Follow-up with Gabrielle Schultz in 2 months after discharge LDL 69 HgbA1c 5.5 SCDs for VTE prophylaxis aspirin 81 mg daily and clopidogrel 75 mg daily prior to admission,   will start aspirin 81 mg daily and Brilinta 90 mg twice daily for 1 month then aspirin alone Patient counseled to be compliant with her antithrombotic medications Ongoing aggressive stroke risk factor management Therapy recommendations: None Disposition: Home   History of stroke History of remote left PCA infarct 11/2019 admitted for right frontal cortical infarct status post tPA.  CT showed chronic left PCA and MCA infarct.  CTA head and neck unremarkable.  EF 65 to 70%.  No DVT.  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  LDL 97, A1c 5.7, UDS negative.  Hypercoag workup negative except homocystine level 52.  Discharged on DAPT and Zocor 40.  Asked her to  follow-up with Dr. Bufford Buttner cardiology as outpatient but she has no insurance. 07/2020 admitted for transient right-sided weakness and numbness, proximal TIA 08/2021 admitted for transient bilateral leg weakness, MRI showed possible subacute parietal infarct with evidence of cortical laminar necrosis.  MRA head and neck negative.  30-day CardioNet monitoring negative. 01/2022 admitted for right facial droop, slurred speech and right hemianopia.  MRI negative, CT negative. 02/2022 follow-up with cardiology Dr. Lynnette Caffey as outpatient.  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 Patient has been referred to cardiovascular surgery but was asked to have cardiac cath done first Patient had cardiac cath appointment today but not able to go Discussed with Dr. Lynnette Caffey, will continue outpatient cardiac cath and outpatient referral to cardiovascular surgery.  Will discontinue Plavix and change to Brilinta 90 mg twice daily.  Continue aspirin 81 mg daily and Brilinta 90 mg twice daily x 4 weeks and then as per cardiologist Dr.Thukkani.  If patient gets cardiac surgery with mechanical aortic valve switch to anticoagulation if not consider loop recorder for A-fib.   Migraine / Complicated migraine Patient has history of migraine, with photophobia and phonophobia, needed dark room and sleep Normally lasting less than 3 hours. No specific medication at home. Recurrent transient episode of neurodeficit with headache for the last 2 or 3 years.  Admitted this time also with transient neurodeficit with headache.  Concerning for complicated migraine Follow-up with Dr. Everlena Cooper who is her primary  outpatient neurologist   Hypertension Stable on the high end BP goal less than 180/105 after TNK within 24 hours On amlodipine, Lasix and Cozaar, clonidine Long term BP goal normotensive   Hyperlipidemia Home meds: Lipitor 80 LDL 69, goal < 70 Now on Lipitor 80 Continue statin at discharge   Tobacco abuse Current smoker Smoking cessation counseling provided Nicotine patch declined Pt is not willing to quit   Other Stroke Risk Factors Obesity, Body mass index is 32.91 kg/m.  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 Numbness and abnormal coordination in fine motor movements of hands, and abnormal gait  Assessment & Plan   1. Hospital discharge follow-up Referred to PT and OT, this was not done in the hospital - Ambulatory referral to Occupational Therapy - Ambulatory referral to Physical Therapy  2. Cryptogenic stroke (HCC) Referred to OT and PT Continue aspirin and plavix - clopidogrel (PLAVIX) 75 MG tablet; Take 1 tablet (75 mg total) by mouth daily.  Dispense: 90 tablet; Refill: 1 - Ambulatory referral to Occupational Therapy - Ambulatory referral to Physical Therapy  3. Numbness in both hands Referred to OT and PT - Ambulatory referral to Occupational Therapy - Ambulatory referral to Physical Therapy  4. Abnormality of gait following cerebrovascular accident Referred to OT and  PT  - Ambulatory referral to Occupational Therapy - Ambulatory referral to Physical Therapy  5. Cutaneous candidiasis Nystatin powder and fluconazole prescribed.  - nystatin (MYCOSTATIN/NYSTOP) powder; Apply 1 Application topically 3 (three) times daily.  Dispense: 30 g; Refill: 5 - fluconazole (DIFLUCAN) 150 MG tablet; Take 1 tablet (150 mg total) by mouth once for 1 dose. May take an additional dose after 3 days if still symptomatic.  Dispense: 3 tablet; Refill: 0  6. Altered sensation due to recent stroke Referred for OT and PT - Ambulatory referral to Occupational Therapy - Ambulatory referral to Physical Therapy  7. Mixed hyperlipidemia Continue atorvastatin as prescribed.  - atorvastatin (LIPITOR) 80 MG tablet; Take 1 tablet (80 mg total) by mouth at bedtime.  Dispense: 90 tablet; Refill: 1  8. Encounter for medication review Medication list reviewed, updated and refills ordered - cetirizine (ZYRTEC) 10 MG tablet; Take 1 tablet (10 mg total) by mouth daily.  Dispense: 90 tablet; Refill: 1 - cloNIDine (CATAPRES) 0.3 MG tablet; Take 1 tablet by mouth 3 times daily (morning, noon, and evening).  Dispense: 270 tablet; Refill: 0 - amLODipine (NORVASC) 5 MG tablet; Take 1 tablet (5 mg total) by mouth daily.  Dispense: 90 tablet; Refill: 0 - pantoprazole (PROTONIX) 40 MG tablet; Take 1 tablet (40 mg total) by mouth 2 (two) times daily before a meal.  Dispense: 180 tablet; Refill: 2   Reason for frequent admissions/ER visits    stroke, heart disease Hypertension CKD   Objective:   Vitals:   05/04/22 0949  BP: (!) 146/71  Pulse: 79  Resp: 16  Temp: 98 F (36.7 C)  SpO2: 98%  Weight: 163 lb 6.4 oz (74.1 kg)  Height: 5\' 1"  (1.549 m)    Wt Readings from Last 3 Encounters:  05/04/22 163 lb 6.4 oz (74.1 kg)  04/22/22 174 lb 2.6 oz (79 kg)  03/22/22 159 lb 12.8 oz (72.5 kg)    Allergies as of 05/04/2022       Reactions   Coconut (cocos Nucifera) Hives, Swelling   Just  coconut ("all coconut")   Amoxicillin Other (See Comments)   "makes me sick" "swelled up everywhere" Has patient had a PCN reaction causing immediate rash, facial/tongue/throat swelling, SOB or lightheadedness with hypotension: No Has patient had a PCN reaction causing severe rash involving mucus membranes or skin necrosis: No Has patient had a PCN reaction that required hospitalization: No Has patient had a PCN reaction  occurring within the last 10 years: No If all of the above answers are "NO", then may proceed with Cephalosporin use.   Penicillins Other (See Comments)   "makes me sick" "swelled up oil" Has patient had a PCN reaction causing immediate rash, facial/tongue/throat swelling, SOB or lightheadedness with hypotension: No Has patient had a PCN reaction causing severe rash involving mucus membranes or skin necrosis: No Has patient had a PCN reaction that required hospitalization: No Has patient had a PCN reaction occurring within the last 10 years: Yes If all of the above answers are "NO", then may proceed with Cephalosporin use.   Singulair [montelukast Sodium] Rash   "made heart race"        Medication List        Accurate as of May 04, 2022 11:04 AM. If you have any questions, ask your nurse or doctor.          STOP taking these medications    simvastatin 10 MG tablet Commonly known as: ZOCOR Stopped by: Sallyanne Kuster, NP   ticagrelor 90 MG Tabs tablet Commonly known as: BRILINTA Stopped by: Sallyanne Kuster, NP       TAKE these medications    acetaminophen 650 MG CR tablet Commonly known as: TYLENOL Take 650-1,300 mg by mouth every 8 (eight) hours as needed for pain.   albuterol 108 (90 Base) MCG/ACT inhaler Commonly known as: Proventil HFA Inhale 2 puffs into the lungs once every 4 (four) hours as needed for wheezing (cough).   allopurinol 100 MG tablet Commonly known as: ZYLOPRIM TAKE ONE TABLET BY MOUTH ONCE DAILY. What changed: how much  to take   amLODipine 5 MG tablet Commonly known as: NORVASC Take 1 tablet (5 mg total) by mouth daily.   aspirin 81 MG chewable tablet Chew 1 tablet (81 mg total) by mouth daily.   atorvastatin 80 MG tablet Commonly known as: LIPITOR Take 1 tablet (80 mg total) by mouth at bedtime.   cetirizine 10 MG tablet Commonly known as: ZYRTEC Take 1 tablet (10 mg total) by mouth daily.   cloNIDine 0.3 MG tablet Commonly known as: CATAPRES Take 1 tablet by mouth 3 times daily (morning, noon, and evening).   clopidogrel 75 MG tablet Commonly known as: PLAVIX Take 1 tablet (75 mg total) by mouth daily. Started by: Sallyanne Kuster, NP   docusate sodium 100 MG capsule Commonly known as: COLACE Take 100 mg by mouth in the morning.   fluconazole 150 MG tablet Commonly known as: DIFLUCAN Take 1 tablet (150 mg total) by mouth once for 1 dose. May take an additional dose after 3 days if still symptomatic. Started by: Sallyanne Kuster, NP   fluticasone 50 MCG/ACT nasal spray Commonly known as: FLONASE USE 1 SPRAY INTO BOTH NOSTRILS 2 TIMES A DAY What changed:  how much to take how to take this when to take this   fluticasone-salmeterol 250-50 MCG/ACT Aepb Commonly known as: Wixela Inhub Inhale one puff into the lungs in the morning and at bedtime   furosemide 20 MG tablet Commonly known as: LASIX Take 1 tablet (20 mg total) by mouth daily.   losartan 50 MG tablet Commonly known as: COZAAR Take 1 tablet (50 mg total) by mouth daily. What changed:  how much to take when to take this   nystatin powder Commonly known as: MYCOSTATIN/NYSTOP Apply 1 Application topically 3 (three) times daily. Started by: Sallyanne Kuster, NP   omeprazole 20 MG capsule Commonly known as: PRILOSEC Take 1  capsule (20 mg total) by mouth daily before breakfast.   pantoprazole 40 MG tablet Commonly known as: PROTONIX Take 1 tablet (40 mg total) by mouth 2 (two) times daily before a meal. Started  by: Sallyanne Kuster, NP   potassium chloride 10 MEQ tablet Commonly known as: KLOR-CON M Take 1 tablet (10 mEq total) by mouth daily. To take whenever you take lasix   promethazine 25 MG tablet Commonly known as: PHENERGAN Take 1 tablet (25 mg total) by mouth as needed for nausea or vomiting.         Physical Exam: Constitutional: Patient appears well-developed and well-nourished. Not in obvious distress. HENT: Normocephalic, atraumatic, External right and left ear normal. Oropharynx is clear and moist.  Eyes: Conjunctivae and EOM are normal. PERRLA, no scleral icterus. Neck: Normal ROM. Neck supple. No JVD. No tracheal deviation. No thyromegaly. CVS: RRR, S1/S2 +, no murmurs, no gallops, no carotid bruit.  Pulmonary: Effort and breath sounds normal, no stridor, rhonchi, wheezes, rales.  Abdominal: Soft. BS +, no distension, tenderness, rebound or guarding.  Musculoskeletal: Normal range of motion. No edema and no tenderness.  Lymphadenopathy: No lymphadenopathy noted, cervical, inguinal or axillary Neuro: Alert. Normal reflexes, muscle tone coordination. No cranial nerve deficit. Skin: Skin is warm and dry. No rash noted. Not diaphoretic. No erythema. No pallor. Psychiatric: Normal mood and affect. Behavior, judgment, thought content normal.   Data Review   Micro Results No results found for this or any previous visit (from the past 240 hour(s)).   CBC No results for input(s): "WBC", "HGB", "HCT", "PLT", "MCV", "MCH", "MCHC", "RDW", "LYMPHSABS", "MONOABS", "EOSABS", "BASOSABS", "BANDABS" in the last 168 hours.  Invalid input(s): "NEUTRABS", "BANDSABD"  Chemistries  No results for input(s): "NA", "K", "CL", "CO2", "GLUCOSE", "BUN", "CREATININE", "CALCIUM", "MG", "AST", "ALT", "ALKPHOS", "BILITOT" in the last 168 hours.  Invalid input(s): "GFRCGP" ------------------------------------------------------------------------------------------------------------------ estimated  creatinine clearance is 30 mL/min (A) (by C-G formula based on SCr of 2.09 mg/dL (H)). ------------------------------------------------------------------------------------------------------------------ No results for input(s): "HGBA1C" in the last 72 hours. ------------------------------------------------------------------------------------------------------------------ No results for input(s): "CHOL", "HDL", "LDLCALC", "TRIG", "CHOLHDL", "LDLDIRECT" in the last 72 hours. ------------------------------------------------------------------------------------------------------------------ No results for input(s): "TSH", "T4TOTAL", "T3FREE", "THYROIDAB" in the last 72 hours.  Invalid input(s): "FREET3" ------------------------------------------------------------------------------------------------------------------ No results for input(s): "VITAMINB12", "FOLATE", "FERRITIN", "TIBC", "IRON", "RETICCTPCT" in the last 72 hours.  Coagulation profile No results for input(s): "INR", "PROTIME" in the last 168 hours.  No results for input(s): "DDIMER" in the last 72 hours.  Cardiac Enzymes No results for input(s): "CKMB", "TROPONINI", "MYOGLOBIN" in the last 168 hours.  Invalid input(s): "CK" ------------------------------------------------------------------------------------------------------------------ Invalid input(s): "POCBNP"  Return for previously scheduled, CPE, Demetrus Pavao PCP in june.  Time Spent in minutes  45 Time spent with patient included reviewing progress notes, labs, imaging studies, and discussing plan for follow up.   This patient was seen by Sallyanne Kuster, FNP-C in collaboration with Dr. Beverely Risen as a part of collaborative care agreement.   Sallyanne Kuster MSN, FNP-C on 05/04/2022 at 11:04 AM   **Disclaimer: This note may have been dictated with voice recognition software. Similar sounding words can inadvertently be transcribed and this note may contain transcription errors  which may not have been corrected upon publication of note.**

## 2022-05-07 ENCOUNTER — Ambulatory Visit: Payer: Medicaid Other | Admitting: Medical

## 2022-05-15 ENCOUNTER — Other Ambulatory Visit: Payer: Self-pay | Admitting: Medical

## 2022-05-15 DIAGNOSIS — I10 Essential (primary) hypertension: Secondary | ICD-10-CM

## 2022-05-17 ENCOUNTER — Emergency Department
Admission: EM | Admit: 2022-05-17 | Discharge: 2022-05-17 | Discharge status: Against Medical Advice | Payer: Medicaid Other

## 2022-05-17 ENCOUNTER — Ambulatory Visit: Payer: Medicaid Other | Admitting: Internal Medicine

## 2022-05-17 ENCOUNTER — Telehealth: Payer: Self-pay

## 2022-05-17 DIAGNOSIS — I959 Hypotension, unspecified: Secondary | ICD-10-CM | POA: Diagnosis not present

## 2022-05-17 DIAGNOSIS — R509 Fever, unspecified: Secondary | ICD-10-CM | POA: Diagnosis not present

## 2022-05-17 DIAGNOSIS — I700 Atherosclerosis of aorta: Secondary | ICD-10-CM | POA: Insufficient documentation

## 2022-05-17 DIAGNOSIS — R0689 Other abnormalities of breathing: Secondary | ICD-10-CM | POA: Diagnosis not present

## 2022-05-17 NOTE — Telephone Encounter (Addendum)
Pt daughter in law called and pt was there she was having fever  and Temp 103,104 and feels sleeping all day as per alyssa advised go to ED or urgent care  and alyssa spoke pt also and advised her to go to ED

## 2022-05-17 NOTE — ED Notes (Signed)
Pt to front desk requesting IV to be taken out. States leaving and going home. Ambulatory. Steady gait, NAD noted

## 2022-05-17 NOTE — Progress Notes (Deleted)
HPI  Patient presents to clinic today to establish care and for management of the conditions listed below.  Migraines: These occur.  Triggered by.  She takes as needed with good relief of symptoms.  She follows with neurology.  HTN: Her BP today is.  She is taking Amlodipine, Losartan and Clonidine as prescribed.  ECG from 04/2022 reviewed.  HLD with Aortic Atherosclerosis, CAD status post MI and TIA: Her last LDL was 69, triglycerides 086, 04/2022.  She denies myalgias on Atorvastatin.  She is taking Aspirin and Plavix as well.  She follows with neurology.  COPD: She denies chronic cough or shortness of breath.  She uses Wixela and Albuterol as prescribed.  There are no PFTs on file.  She follows with pulmonology.  GERD: Triggered by.  She denies breakthrough on.  There is no upper GI on file.  OAB: She reports mainly.  She is not currently taking any medications for this.  She does not follow with urology.  CKD 4: Her last creatinine was 2.14, GFR 28, 04/2022.  She is on Losartan for renal protection.  She follows with nephrology.  Anemia of CKD: Her last H/H was 10.4/30.2, 04/2022.  She is not taking any Iron as prescribed.  She does not follow with hematology.  Prediabetes: Her last A1c was 5.5%, 04/2022.  She is not taking any oral diabetic medication at this time.  She does not check her sugars.  Past Medical History:  Diagnosis Date   Acid reflux    Allergic rhinitis 06/26/2015   Allergy    Asthma    Carpal tunnel syndrome    right hand   Chronic kidney disease    Colitis    CVA (cerebral infarction)    2009, 2015   Gout    History of syncope    Hypertension 2008   Low HDL (under 40) 06/26/2015   MI (myocardial infarction) (HCC)    2015 - patient wasnt told by cardiologist that she had MI but has had cardiac cath in past   Migraines    Poor circulation    Renal insufficiency    Stroke [facility])    Syncope and collapse     Current Outpatient Medications  Medication Sig  Dispense Refill   acetaminophen (TYLENOL) 650 MG CR tablet Take 650-1,300 mg by mouth every 8 (eight) hours as needed for pain.     albuterol (PROVENTIL HFA) 108 (90 Base) MCG/ACT inhaler Inhale 2 puffs into the lungs once every 4 (four) hours as needed for wheezing (cough). 18 g 5   allopurinol (ZYLOPRIM) 100 MG tablet TAKE ONE TABLET BY MOUTH ONCE DAILY. (Patient taking differently: Take 100 mg by mouth daily.) 90 tablet 3   amLODipine (NORVASC) 5 MG tablet Take 1 tablet (5 mg total) by mouth daily. 90 tablet 0   aspirin 81 MG chewable tablet Chew 1 tablet (81 mg total) by mouth daily. 90 tablet 0   atorvastatin (LIPITOR) 80 MG tablet Take 1 tablet (80 mg total) by mouth at bedtime. 90 tablet 1   cetirizine (ZYRTEC) 10 MG tablet Take 1 tablet (10 mg total) by mouth daily. 90 tablet 1   cloNIDine (CATAPRES) 0.3 MG tablet Take 1 tablet by mouth 3 times daily (morning, noon, and evening). 270 tablet 0   clopidogrel (PLAVIX) 75 MG tablet Take 1 tablet (75 mg total) by mouth daily. 90 tablet 1   docusate sodium (COLACE) 100 MG capsule Take 100 mg by mouth in the morning.  fluticasone (FLONASE) 50 MCG/ACT nasal spray USE 1 SPRAY INTO BOTH NOSTRILS 2 TIMES A DAY (Patient taking differently: Place 1 spray into both nostrils at bedtime. USE 1 SPRAY INTO BOTH NOSTRILS 2 TIMES A DAY) 16 g 0   fluticasone-salmeterol (WIXELA INHUB) 250-50 MCG/ACT AEPB Inhale one puff into the lungs in the morning and at bedtime 60 each 11   furosemide (LASIX) 20 MG tablet Take 1 tablet (20 mg total) by mouth daily. 30 tablet 0   losartan (COZAAR) 50 MG tablet Take 1 tablet (50 mg total) by mouth daily. (Patient taking differently: Take 25 mg by mouth in the morning. Taking 1/2 tablet of Losartan 50 mg) 90 tablet 0   nystatin (MYCOSTATIN/NYSTOP) powder Apply 1 Application topically 3 (three) times daily. 30 g 5   omeprazole (PRILOSEC) 20 MG capsule Take 1 capsule (20 mg total) by mouth daily before breakfast. 90 capsule 1    pantoprazole (PROTONIX) 40 MG tablet Take 1 tablet (40 mg total) by mouth 2 (two) times daily before a meal. 180 tablet 2   potassium chloride (KLOR-CON M) 10 MEQ tablet Take 1 tablet (10 mEq total) by mouth daily. To take whenever you take lasix 90 tablet 1   promethazine (PHENERGAN) 25 MG tablet Take 1 tablet (25 mg total) by mouth as needed for nausea or vomiting. 30 tablet 2   No current facility-administered medications for this visit.    Allergies  Allergen Reactions   Coconut (Cocos Nucifera) Hives and Swelling    Just coconut ("all coconut")   Amoxicillin Other (See Comments)    "makes me sick" "swelled up everywhere" Has patient had a PCN reaction causing immediate rash, facial/tongue/throat swelling, SOB or lightheadedness with hypotension: No Has patient had a PCN reaction causing severe rash involving mucus membranes or skin necrosis: No Has patient had a PCN reaction that required hospitalization: No Has patient had a PCN reaction occurring within the last 10 years: No If all of the above answers are "NO", then may proceed with Cephalosporin use.     Penicillins Other (See Comments)    "makes me sick" "swelled up oil" Has patient had a PCN reaction causing immediate rash, facial/tongue/throat swelling, SOB or lightheadedness with hypotension: No Has patient had a PCN reaction causing severe rash involving mucus membranes or skin necrosis: No Has patient had a PCN reaction that required hospitalization: No Has patient had a PCN reaction occurring within the last 10 years: Yes If all of the above answers are "NO", then may proceed with Cephalosporin use.    Singulair [Montelukast Sodium] Rash    "made heart race"    Family History  Problem Relation Age of Onset   Hyperlipidemia Mother    Heart disease Mother        has a defibrillator   COPD Mother    Hypertension Father    Heart disease Father    Hyperlipidemia Father    Hypertension Sister        died in her 30s  from heart failure and end stage kidney disease   Kidney disease Sister    Hypertension Brother    Heart disease Brother        has a defibrillator   Coronary artery disease Brother        had CABG   Hyperlipidemia Brother    Healthy Child    Diabetes Child     Social History   Socioeconomic History   Marital status: Married    Spouse name:  Not on file   Number of children: 2   Years of education: 11th grade   Highest education level: 11th grade  Occupational History   Occupation: unemployed  Tobacco Use   Smoking status: Every Day    Packs/day: 0.50    Years: 33.00    Additional pack years: 0.00    Total pack years: 16.50    Types: Cigarettes    Start date: 03/04/2017   Smokeless tobacco: Never  Vaping Use   Vaping Use: Never used  Substance and Sexual Activity   Alcohol use: Yes    Comment: rare- drinks an occassional wine cooler   Drug use: No   Sexual activity: Yes    Partners: Male    Birth control/protection: None  Other Topics Concern   Not on file  Social History Narrative   Husband is a Naval architect for [employer] and is the sole provider for the family including 2 children (80 yr old and 1 yr old)    Goes to food bank for food. Says "really worried" about water and light bill. Husband, baby, 37 year old, her, and mother all live together.    Patient would [facility] appointment with [facility].       Pt is right handed   Social Determinants of Health   Financial Resource Strain: High Risk (08/02/2017)   Overall Financial Resource Strain (CARDIA)    Difficulty of Paying Living Expenses: Hard  Food Insecurity: Food Insecurity Present (04/23/2022)   Hunger Vital Sign    Worried About Running Out of Food in the Last Year: Often true    Ran Out of Food in the Last Year: Often true  Transportation Needs: Unmet Transportation Needs (04/27/2022)   PRAPARE - Administrator, Civil Service (Medical): Yes    Lack of Transportation (Non-Medical): Yes   Physical Activity: Insufficiently Active (07/25/2018)   Exercise Vital Sign    Days of Exercise per Week: 2 days    Minutes of Exercise per Session: 60 min  Stress: No Stress Concern Present (04/20/2022)   [employer] of Occupational Health - Occupational Stress Questionnaire    Feeling of Stress : Only a little  Social Connections: Somewhat Isolated (08/02/2017)   Social Connection and Isolation Panel [NHANES]    Frequency of Communication with Friends and Family: More than three times a week    Frequency of Social Gatherings with Friends and Family: Never    Attends Religious Services: Never    Database administrator or Organizations: No    Attends Banker Meetings: Never    Marital Status: Married  Catering manager Violence: Not At Risk (04/23/2022)   Humiliation, Afraid, Rape, and Kick questionnaire    Fear of Current or Ex-Partner: No    Emotionally Abused: No    Physically Abused: No    Sexually Abused: No    ROS:  Constitutional: Patient reports intermittent headaches.  Denies fever, malaise, fatigue, or abrupt weight changes.  HEENT: Denies eye pain, eye redness, ear pain, ringing in the ears, wax buildup, runny nose, nasal congestion, bloody nose, or sore throat. Respiratory: Denies difficulty breathing, shortness of breath, cough or sputum production.   Cardiovascular: Denies chest pain, chest tightness, palpitations or swelling in the hands or feet.  Gastrointestinal: Denies abdominal pain, bloating, constipation, diarrhea or blood in the stool.  GU: Denies frequency, urgency, pain with urination, blood in urine, odor or discharge. Musculoskeletal: Denies decrease in range of motion, difficulty with gait, muscle  pain or joint pain and swelling.  Skin: Denies redness, rashes, lesions or ulcercations.  Neurological: Denies dizziness, difficulty with memory, difficulty with speech or problems with balance and coordination.  Psych: Denies anxiety, depression,  SI/HI.  No other specific complaints in a complete review of systems (except as listed in HPI above).  PE:  LMP 08/18/2020 (Approximate) Comment: Tubal ligation Wt Readings from Last 3 Encounters:  05/04/22 163 lb (73.9 kg)  05/04/22 163 lb 6.4 oz (74.1 kg)  04/22/22 174 lb 2.6 oz (79 kg)    General: Appears their stated age, well developed, well nourished in NAD. HEENT: Head: normal shape and size; Eyes: sclera white, no icterus, conjunctiva pink, PERRLA and EOMs intact; Ears: Tm's gray and intact, normal light reflex;Throat/Mouth: Teeth present, mucosa pink and moist, no lesions or ulcerations noted.  Neck: Neck supple, trachea midline. No masses, lumps or thyromegaly present.  Cardiovascular: Normal rate and rhythm. S1,S2 noted.  No murmur, rubs or gallops noted. No JVD or BLE edema. No carotid bruits noted. Pulmonary/Chest: Normal effort and positive vesicular breath sounds. No respiratory distress. No wheezes, rales or ronchi noted.  Abdomen: Soft and nontender. Normal bowel sounds, no bruits noted. No distention or masses noted. Liver, spleen and kidneys non palpable. Musculoskeletal: Normal range of motion. Strength 5/5 BUE/BLE. No signs of joint swelling. No difficulty with gait.  Neurological: Alert and oriented. Cranial nerves II-XII grossly intact. Coordination normal.  Psychiatric: Mood and affect normal. Behavior is normal. Judgment and thought content normal.    BMET    Component Value Date/Time   NA 140 05/04/2022 1602   NA 141 11/19/2021 1851   NA 132 (L) 05/20/2013 0508   K 4.5 05/04/2022 1602   K 3.9 05/20/2013 0508   CL 109 05/04/2022 1602   CL 107 05/20/2013 0508   CO2 22 05/04/2022 1602   CO2 21 05/20/2013 0508   GLUCOSE 105 (H) 05/04/2022 1602   GLUCOSE 91 05/20/2013 0508   BUN 35 (H) 05/04/2022 1602   BUN 20 11/19/2021 1851   BUN 16 05/20/2013 0508   CREATININE 2.14 (H) 05/04/2022 1602   CREATININE 1.65 (H) 05/20/2013 0508   CALCIUM 9.4 05/04/2022  1602   CALCIUM 7.9 (L) 05/20/2013 0508   GFRNONAA 28 (L) 05/04/2022 1602   GFRNONAA 38 (L) 05/20/2013 0508   GFRAA 35 (L) 01/10/2020 1913   GFRAA 45 (L) 05/20/2013 0508    Lipid Panel     Component Value Date/Time   CHOL 115 04/23/2022 0332   CHOL 188 01/10/2020 1913   CHOL 123 10/20/2012 0226   TRIG 100 04/23/2022 0332   TRIG 85 10/20/2012 0226   HDL 26 (L) 04/23/2022 0332   HDL 34 (L) 01/10/2020 1913   HDL 20 (L) 10/20/2012 0226   CHOLHDL 4.4 04/23/2022 0332   VLDL 20 04/23/2022 0332   VLDL 17 10/20/2012 0226   LDLCALC 69 04/23/2022 0332   LDLCALC 126 (H) 01/10/2020 1913   LDLCALC 86 10/20/2012 0226    CBC    Component Value Date/Time   WBC 8.8 04/24/2022 0249   RBC 2.92 (L) 04/24/2022 0249   HGB 10.4 (L) 04/24/2022 0249   HGB 12.3 05/21/2021 1932   HCT 30.2 (L) 04/24/2022 0249   HCT 35.6 05/21/2021 1932   PLT 211 04/24/2022 0249   PLT 242 05/21/2021 1932   MCV 103.4 (H) 04/24/2022 0249   MCV 96 05/21/2021 1932   MCV 93 05/19/2013 0541   MCH 35.6 (H) 04/24/2022 0249  MCHC 34.4 04/24/2022 0249   RDW 18.7 (H) 04/24/2022 0249   RDW 15.4 05/21/2021 1932   RDW 12.4 05/19/2013 0541   LYMPHSABS 3.6 04/22/2022 2225   LYMPHSABS 2.5 05/21/2021 1932   LYMPHSABS 1.1 05/19/2013 0541   MONOABS 0.6 04/22/2022 2225   MONOABS 0.3 05/19/2013 0541   EOSABS 0.7 (H) 04/22/2022 2225   EOSABS 0.4 05/21/2021 1932   EOSABS 0.0 05/19/2013 0541   BASOSABS 0.1 04/22/2022 2225   BASOSABS 0.1 05/21/2021 1932   BASOSABS 0.0 05/19/2013 0541    Hgb A1C Lab Results  Component Value Date   HGBA1C 5.5 04/23/2022     Assessment and Plan:    RTC in 6 months for your annual exam Nicki Reaper, NP

## 2022-05-18 NOTE — Progress Notes (Unsigned)
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   Migraine without aura, without status migrainosus, not intractable Tobacco use disorder - less than 1 ppd.   Stroke management: Dr. Pearlean Brownie requested follow up in the stroke clinic at [facility].  Will send referral Currently on ASA 81mg  daily and Plavix.  Unable to tolerate Brilinta Follow up with cardiothoracic surgery  Follow up with stroke clinic at [facility] Migraines - infrequent, so I do not think adding another daily medication to her extensive medication list is warranted at this time. Provided samples of Ubrelvy to try as needed. Follow up in August as scheduled.   Subjective:  Gabrielle Schultz is a 52 year old right-handed female with HTN, CKD, COPD, tobacco use disorder, ischemic colitis and history of strokes who follows up for stroke. Recent CT/MRI brain and MRA head and neck personally reviewed.  She is accompanied by her daughter who supplements history.   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  Plan was to have cardiac cath followed by referral to cardiovascular surgery for possible mechanical aortic valve.  On 4/18, she developed sudden onset left sided weakness with right sided facial droop and slurred speech.  Admitted to [facility] where she was given TKN.  Symptoms resolved after 4-5 hours followed by headache lasting  1 hour, similar semiology to prior episodes.  CT head showed old left PCA infarct but no acute findings.  CTA of head and neck was negative for LVO or hemodynamically significant stenosis.  MRI of brain revealed acute parietal ischemic infarct.  On ASA 81mg  and Plavix prior admission.  Discharged on ASA 81mg  and Brilinta 90mg  BID for secondary stroke prevention for 1 month followed by ASA 81mg  monotherapy.  However, she could not tolerate the Brilinta as it caused headache, so she was told to stop it and resumed Plavix.  Plan is to follow up with cardiovascular thoracic surgery.  If surgery not indicated, then plan is for outpatient loop recorder to further evaluate for a fib.      HISTORY: She had a stroke at age 9.  She doesn't remember her symptoms.   She had a second stroke in addition to an acute ischemic colitis in 2014.  She does not remember her symptoms but records report right homonymous hemianopsia and right arm and leg weakness and numbness of 2 months duration.   MRI of brain without contrast from 12/29/2012 demonstrated "[R]emote moderate size left occipital/posterior inferior left temporal lobe infarct with small area of involvement of the inferior left parietal lobe.  Associated encephalomalacia and laminar necrosis.  No evidence of acute infarct.  Remote small left frontal lobe infarct.  Mild to moderate nonspecific white matter type changes.Marland KitchenMarland KitchenGlobal atrophy without hydrocephalus."  She reports that no cause for her strokes were ever found.  Following her stroke, she never underwent PT/OT because she had no insurance.  Since the second stroke, she continues to have aching and cramping of her hand down to her elbow with  use of her hand.  She also has numbness involving the first 3 digits of her right hand.  She has history of carpal tunnel syndrome diagnosed many years ago, so she tried wearing a brace.  NCV-EMG of right upper extremity on 11/23/2018 was normal.   She was admitted to [facility] in November 2021 for a third stroke presenting with sudden left sided weakness.  Blood pressure was in the 200s systolic.  CT head was negative for bleed.  She received IV tPA.  MRI of brain showed small acute posterior right frontal lobe infarct.  2D echo showed EF 65-70% with no source of embolus.  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  Blood cultures were negative.  [employer] Virus 2 negative, LDL 97, and Hgb A1c 5.7.  Hypercoagulable panel showed homocysteine 52 (thought to be secondary to smoking), antithrombin III 121, protein C 109, B2GPI negative, lupus anticoagulant panel negative and cardiolipin negative.  She was discharged on ASA and Plavix 75mg  daily for 3 weeks followed by Plavix alone.  Zocor was increased to 40mg  daily, however her PCP reduced dose due to her kidney disease.    On 07/21/2020, she developed sudden onset right sided weakness and aphasia lasting 20 minutes.  She went to the ED where CT and MRI of brain showed chronic cortical infarcts, primarily left occipital lobe, but no acute stroke.  MRA of head limited due to motion but no large vessel occlusion. Carotid ultrasound negative for hemodynamically significant stenosis.  Echocardiogram showed EF 65-70% with negative bubble study.  LDL was 119 and Hgb A1c was 5.9.  She was found to be positive for COVID.  Discharged on ASA and Plavix for 3 weeks followed by Plavix alone.  Simvastatin was changed to atorvastatin 40mg .  Unclear if this was a left hemispheric transient ischemic attack vs recrudescence of previous stroke symptoms in setting of COVID.   Admitted to [facility] for another stroke.  She noted transient bilateral leg weakness ("legs felt like jelly").  She also woke up that morning feeling dizzy.  In the hospital, patient was without neurologic deficits.   MRI of brain showed probable subacute right parietal infarct with cortical laminar necrosis as well as chronic small vessel ischemic changes with remote infarcts in the left occipital lobe, left frontal lobe, and cerebellum.  MRA head and neck was normal.  2D echo showed LVEF 60-65% with aortic valve sclerosis and moderate regurg and negative bubble study.  Labs included LDL 78, negative Factor V leiden gene muation and negative HIV.  She had an elevated troponin determined to be secondary to stroke. 12 day cardiac event monitor negative for afib.  Currently taking ASA and Plavix  On 10/27/2021 she developed left arm numbness and heaviness that resolved after 15 minutes, similar to previous stroke symptoms from August.  CT head showed remote infarcts but no acute abnormality.  She had recent change to her blood pressure medication and it was thought that she had recrudescence of symptoms due to hypotension.    On 01/09/2022, she developed right sided facial droop and slurred speech.  She was seen at [facility] where CT and follow up MRI of brain revealed no acute stroke.  2D echo revealed EF 60-65% with moderate to severe aortic valve regurgitation.    History of migraines with severe diffuse pounding headache, photophobia and  phonophobia lasting less than 3 hours and occurring every 2 to 3 weeks.  She has had recurrent episodes of right facial droop and slurred speech followed by headache, concerning for complicated migraine.      PAST MEDICAL HISTORY: Past Medical History:  Diagnosis Date   Acid reflux    Allergic rhinitis 06/26/2015   Allergy    Asthma    Carpal tunnel syndrome    right hand   Chronic kidney disease    Colitis    CVA (cerebral infarction)    2009, 2015   Gout    History of syncope    Hypertension 2008   Low HDL (under 40) 06/26/2015   MI (myocardial infarction) (HCC)    2015 - patient wasnt told by cardiologist that she had MI but has had cardiac cath in past    Migraines    Poor circulation    Renal insufficiency    Stroke [facility])    Syncope and collapse     MEDICATIONS: Current Outpatient Medications on File Prior to Visit  Medication Sig Dispense Refill   acetaminophen (TYLENOL) 500 MG tablet Take 500 mg by mouth every 6 (six) hours as needed for fever or mild pain.     albuterol (PROVENTIL HFA) 108 (90 Base) MCG/ACT inhaler Inhale 2 puffs into the lungs once every 4 (four) hours as needed for wheezing (cough). 18 g 0   allopurinol (ZYLOPRIM) 100 MG tablet TAKE ONE TABLET BY MOUTH ONCE DAILY. (Patient taking differently: Take 100 mg by mouth daily.) 30 tablet 0   amLODipine (NORVASC) 2.5 MG tablet Take 1 tablet (2.5 mg total) by mouth daily. 90 tablet 0   aspirin EC 81 MG tablet Take 1 tablet (81 mg total) by mouth once daily. Swallow whole. 30 tablet 0   atorvastatin (LIPITOR) 80 MG tablet Take 1 tablet (80 mg total) by mouth daily. 90 tablet 0   cetirizine (ZYRTEC) 10 MG tablet TAKE ONE TABLET BY MOUTH ONCE DAILY. 30 tablet 0   cloNIDine (CATAPRES) 0.3 MG tablet Take 1 tablet by mouth 3 times daily (morning, noon, and evening). 270 tablet 0   clopidogrel (PLAVIX) 75 MG tablet Take 1 tablet (75 mg total) by mouth once daily. 90 tablet 0   fluticasone (FLONASE) 50 MCG/ACT nasal spray USE 1 SPRAY INTO BOTH NOSTRILS 2 TIMES A DAY 16 g 0   fluticasone-salmeterol (WIXELA INHUB) 250-50 MCG/ACT AEPB Inhale one puff into the lungs in the morning and at bedtime 60 each 0   furosemide (LASIX) 20 MG tablet Take 1 tablet (20 mg total) by mouth daily as needed for fluid or edema. 90 tablet 0   losartan (COZAAR) 50 MG tablet Take 1 tablet (50 mg total) by mouth daily. 90 tablet 0   omeprazole (PRILOSEC) 20 MG capsule Take 20 mg by mouth daily.     pantoprazole (PROTONIX) 40 MG tablet Take 1 tablet (40 mg total) by mouth daily. 30 tablet 0   potassium chloride (KLOR-CON M) 10 MEQ tablet Take 1 tablet (10 mEq total) by mouth as needed (potassium). To take  whenever you take lasix 30 tablet 0   promethazine (PHENERGAN) 25 MG tablet Take 25 mg by mouth as needed for nausea or vomiting.     [DISCONTINUED] simvastatin (ZOCOR) 10 MG tablet TAKE ONE TABLET BY MOUTH EVERY DAY (Patient not taking: Reported on 07/22/2020) 90 tablet 0   No current facility-administered medications on file prior to visit.     ALLERGIES: Allergies  Allergen  Reactions   Coconut (Cocos Nucifera) Hives and Swelling    Just coconut ("all coconut")   Amoxicillin Other (See Comments)    "makes me sick" "swelled up everywhere" Has patient had a PCN reaction causing immediate rash, facial/tongue/throat swelling, SOB or lightheadedness with hypotension: No Has patient had a PCN reaction causing severe rash involving mucus membranes or skin necrosis: No Has patient had a PCN reaction that required hospitalization: No Has patient had a PCN reaction occurring within the last 10 years: No If all of the above answers are "NO", then may proceed with Cephalosporin use.     Penicillins Other (See Comments)    "makes me sick" "swelled up oil" Has patient had a PCN reaction causing immediate rash, facial/tongue/throat swelling, SOB or lightheadedness with hypotension: No Has patient had a PCN reaction causing severe rash involving mucus membranes or skin necrosis: No Has patient had a PCN reaction that required hospitalization: No Has patient had a PCN reaction occurring within the last 10 years: Yes If all of the above answers are "NO", then may proceed with Cephalosporin use.    Singulair [Montelukast Sodium] Rash    "made heart race"    FAMILY HISTORY: Family History  Problem Relation Age of Onset   Hyperlipidemia Mother    Heart disease Mother        has a defibrillator   COPD Mother    Hypertension Father    Heart disease Father    Hyperlipidemia Father    Hypertension Sister        died in her 30s from heart failure and end stage kidney disease   Kidney disease  Sister    Hypertension Brother    Heart disease Brother        has a defibrillator   Coronary artery disease Brother        had CABG   Hyperlipidemia Brother    Healthy Child    Diabetes Child       Objective:  Blood pressure (!) 104/52, pulse 96, height 5\' 1"  (1.549 m), weight 164 lb 6.4 oz (74.6 kg), last menstrual period 08/18/2020, SpO2 96 %. General: No acute distress.  Patient appears well-groomed.   Head:  Normocephalic/atraumatic Eyes:  Fundi examined but not visualized Neck: supple, no paraspinal tenderness, full range of motion Heart:  Regular rate and rhythm Neurological Exam: Alert and oriented.  Speech fluent and not dysarthric.  Language intact.  Right homonymous hemianopsia.  Otherwise, CN II-XII intact.  Bulk and tone normal.  Muscle strength 5-/5 right deltoid and  left hand grip, otherwise 5/5.  Sensation to temperature and vibration intact.  Deep tendon reflexes 2+ throughout, toes downgoing.  Finger to nose testing intact.  Broad-based cautious gait.  Romberg negative.   Shon Millet, DO  CC: Shane Crutch, [place]

## 2022-05-19 ENCOUNTER — Encounter: Payer: Self-pay | Admitting: Neurology

## 2022-05-19 ENCOUNTER — Ambulatory Visit: Payer: Medicaid Other | Admitting: Neurology

## 2022-05-19 ENCOUNTER — Other Ambulatory Visit: Payer: Self-pay

## 2022-05-19 VITALS — BP 104/52 | HR 96 | Ht 61.0 in | Wt 164.4 lb

## 2022-05-19 DIAGNOSIS — E785 Hyperlipidemia, unspecified: Secondary | ICD-10-CM | POA: Diagnosis not present

## 2022-05-19 DIAGNOSIS — G43009 Migraine without aura, not intractable, without status migrainosus: Secondary | ICD-10-CM

## 2022-05-19 DIAGNOSIS — I350 Nonrheumatic aortic (valve) stenosis: Secondary | ICD-10-CM

## 2022-05-19 DIAGNOSIS — I639 Cerebral infarction, unspecified: Secondary | ICD-10-CM | POA: Diagnosis not present

## 2022-05-19 DIAGNOSIS — I10 Essential (primary) hypertension: Secondary | ICD-10-CM | POA: Diagnosis not present

## 2022-05-19 NOTE — Patient Instructions (Signed)
Continue aspirin 81mg  daily and Plavix 75mg  daily for now.  I will refer you to the stroke clinic at [facility] with Dr. Pearlean Brownie At earliest onset of migraine (headache/right sided facial droop), take Ubrelvy.  May repeat after 2 hours.  Maximum 2 tablets in 24 hours.  Let me know if effective Follow up with me in August as scheduled.

## 2022-05-20 ENCOUNTER — Encounter: Payer: Self-pay | Admitting: Obstetrics and Gynecology

## 2022-05-20 ENCOUNTER — Other Ambulatory Visit: Payer: Medicaid Other | Admitting: Obstetrics and Gynecology

## 2022-05-20 NOTE — Patient Outreach (Signed)
Medicaid Managed Care   Nurse Care Manager Note  05/20/2022 Name:  Gabrielle Schultz MRN:  [id number] DOB:  February 29, 1970  Gabrielle Schultz is an 52 y.o. year old female who is a primary patient of Sallyanne Kuster, NP.  [facility] Managed Care Coordination team was consulted for assistance with:    Chronic healthcare management needs, HLD, LVA, gout, h/o multiple strokes, MI, migraines, HTN, TIA, AVD-stenosis, asthma, rhinitis, COPD, sinusitis, dermatitis, CKD, tobacco use  [name] was given information about Medicaid Managed Care Coordination team services today. Gabrielle Schultz Patient agreed to services and verbal consent obtained.  Engaged with patient by telephone for follow up visit in response to provider referral for case management and/or care coordination services.   Assessments/Interventions:  Review of past medical history, allergies, medications, health status, including review of consultants reports, laboratory and other test data, was performed as part of comprehensive evaluation and provision of chronic care management services.  SDOH (Social Determinants of Health) assessments and interventions performed: SDOH Interventions    Flowsheet Row Patient Outreach Telephone from 05/20/2022 in [place] POPULATION HEALTH DEPARTMENT Telephone from 04/27/2022 in [facility] Care Coordination Patient Outreach Telephone from 04/20/2022 in [place] POPULATION HEALTH DEPARTMENT Office Visit from 05/22/2020 in [facility]  SDOH Interventions      Food Insecurity Interventions -- -- -- Other (Comment)  [food bank and husband's income]  Transportation Interventions -- Other (Comment)  [discussed with pt transportation available through Medicaid-she does not use them as she is unable to take her 50yr old along the ride with her-states she is able to get family to get her to appts] -- Intervention Not Indicated  Utilities Interventions -- -- Intervention Not  Indicated --  Financial Strain Interventions Intervention Not Indicated -- -- --  Physical Activity Interventions Other (Comments)  [patient starting PT] -- -- --  Stress Interventions -- -- Intervention Not Indicated --     Care Plan  Allergies  Allergen Reactions   Coconut (Cocos Nucifera) Hives and Swelling    Just coconut ("all coconut")   Amoxicillin Other (See Comments)    "makes me sick" "swelled up everywhere" Has patient had a PCN reaction causing immediate rash, facial/tongue/throat swelling, SOB or lightheadedness with hypotension: No Has patient had a PCN reaction causing severe rash involving mucus membranes or skin necrosis: No Has patient had a PCN reaction that required hospitalization: No Has patient had a PCN reaction occurring within the last 10 years: No If all of the above answers are "NO", then may proceed with Cephalosporin use.     Penicillins Other (See Comments)    "makes me sick" "swelled up oil" Has patient had a PCN reaction causing immediate rash, facial/tongue/throat swelling, SOB or lightheadedness with hypotension: No Has patient had a PCN reaction causing severe rash involving mucus membranes or skin necrosis: No Has patient had a PCN reaction that required hospitalization: No Has patient had a PCN reaction occurring within the last 10 years: Yes If all of the above answers are "NO", then may proceed with Cephalosporin use.    Singulair [Montelukast Sodium] Rash    "made heart race"   Medications Reviewed Today     Reviewed by [name], RN (Registered Nurse) on 05/20/22 at 1456  Med List Status: <None>   Medication Order Taking? Sig Documenting Provider Last Dose Status Informant  acetaminophen (TYLENOL) 650 MG CR tablet [id number] No Take 650-1,300 mg by mouth every 8 (eight)  hours as needed for pain. [provider] Taking Active Self, Pharmacy Records  albuterol (PROVENTIL HFA) 108 (90 Base) MCG/ACT inhaler [id number] No Inhale 2  puffs into the lungs once every 4 (four) hours as needed for wheezing (cough). Sallyanne Kuster, NP Taking Active Self, Pharmacy Records  allopurinol (ZYLOPRIM) 100 MG tablet [id number] No TAKE ONE TABLET BY MOUTH ONCE DAILY.  Patient taking differently: Take 100 mg by mouth daily.   Sallyanne Kuster, NP Taking Active Self, Pharmacy Records  amLODipine (NORVASC) 5 MG tablet [id number] No Take 1 tablet (5 mg total) by mouth daily. Sallyanne Kuster, NP Taking Active   aspirin 81 MG chewable tablet [id number] No Chew 1 tablet (81 mg total) by mouth daily. Mathews Argyle, NP Taking Active   atorvastatin (LIPITOR) 80 MG tablet [id number] No Take 1 tablet (80 mg total) by mouth at bedtime. Sallyanne Kuster, NP Taking Active   cetirizine (ZYRTEC) 10 MG tablet [id number] No Take 1 tablet (10 mg total) by mouth daily. Sallyanne Kuster, NP Taking Active   cloNIDine (CATAPRES) 0.3 MG tablet [id number] No Take 1 tablet by mouth 3 times daily (morning, noon, and evening). Sallyanne Kuster, NP Taking Active   clopidogrel (PLAVIX) 75 MG tablet [id number] No Take 1 tablet (75 mg total) by mouth daily. Sallyanne Kuster, NP Taking Active   docusate sodium (COLACE) 100 MG capsule [id number] No Take 100 mg by mouth in the morning. [provider] Taking Active Self, Pharmacy Records  fluticasone (FLONASE) 50 MCG/ACT nasal spray [id number] No USE 1 SPRAY INTO BOTH NOSTRILS 2 TIMES A DAY  Patient taking differently: Place 1 spray into both nostrils at bedtime. USE 1 SPRAY INTO BOTH NOSTRILS 2 TIMES A DAY   Wieting, Richard, MD Taking Active Self, Pharmacy Records  fluticasone-salmeterol [facility]) 250-50 MCG/ACT AEPB [id number] No Inhale one puff into the lungs in the morning and at bedtime Sallyanne Kuster, NP Taking Active Self, Pharmacy Records  furosemide (LASIX) 20 MG tablet [id number] No Take 1 tablet (20 mg total) by mouth daily. Mathews Argyle, NP Taking Active   losartan (COZAAR) 50 MG tablet  [id number] No TAKE 1 TABLET(50 MG) BY MOUTH DAILY Furth, Cadence H, PA-C Taking Active   nystatin (MYCOSTATIN/NYSTOP) powder [id number] No Apply 1 Application topically 3 (three) times daily. Sallyanne Kuster, NP Taking Active   omeprazole (PRILOSEC) 20 MG capsule [id number] No Take 1 capsule (20 mg total) by mouth daily before breakfast. Sallyanne Kuster, NP Taking Active Self, Pharmacy Records  pantoprazole (PROTONIX) 40 MG tablet [id number] No Take 1 tablet (40 mg total) by mouth 2 (two) times daily before a meal. Abernathy, Alyssa, NP Taking Active   potassium chloride (KLOR-CON M) 10 MEQ tablet [id number] No Take 1 tablet (10 mEq total) by mouth daily. To take whenever you take lasix Sallyanne Kuster, NP Taking Active Self, Pharmacy Records  promethazine (PHENERGAN) 25 MG tablet [id number] No Take 1 tablet (25 mg total) by mouth as needed for nausea or vomiting. 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/10/2022   Urge and stress incontinence 08/27/2021   COPD (chronic obstructive pulmonary disease) (HCC) 08/19/2021   History of TIA (transient ischemic attack) 07/22/2020   Hyperlipidemia    History of MI (myocardial infarction)  Migraines    Essential hypertension 12/23/2018   Chronic kidney disease 11/21/2013   Conditions to be addressed/monitored per PCP order:  Chronic healthcare management needs, HLD, LVA, gout, h/o multiple strokes, MI, migraines, HTN, TIA, AVD-stenosis, asthma, rhinitis, COPD, sinusitis, dermatitis, CKD, tobacco use  Care Plan : RN Care Manager Plan of Care  Updates made by [name], RN since 05/20/2022 12:00 AM     Problem: Health Promotion or Disease Self-Management (General Plan of Care)      Long-Range Goal: Chronic  Disease Management   Start Date: 03/19/2022  Expected End Date: 08/20/2022  Priority: High  Note:   Current Barriers:  Knowledge Deficits related to plan of care for management of LVA, stroke, MI, migraines, HTN, TIA, AVD, asthma, rhinitis, COPD, sinusitis, dermatitis, CKD, tobacco use, HLD  Chronic Disease Management support and education needs related to LVA, stroke, MI, migraines, HTN, TIA, AVD, asthma, rhinitis, COPD, sinusitis, dermatitis, CKD, tobacco use, HLD  05/20/22:  Patient hospitalized 4/18-4/20 for CVA-to start PT/OT next week.  Smokes 1/2 ppd.  Patient to contact insurance company today about dental providers.  Has eye provider.  Recent visits with [facility]URO/CARDS/PCP-follow up appts scheduled.  Patient to follow up on PCS services recommended for 2-3 days a week.    RNCM Clinical Goal(s):  Patient will verbalize understanding of plan for management of LVA, stroke, MI, migraines, HTN, TIA, AVD, asthma, rhinitis, COPD, sinusitis, dermatitis, CKD, tobacco use, HLD as evidenced by patient report verbalize basic understanding of LVA, stroke, MI, migraines, HTN, TIA, AVD, asthma, rhinitis, COPD, sinusitis, dermatitis, CKD, tobacco use, HLD   disease process and self health management plan as evidenced by patient report take all medications exactly as prescribed and will call provider for medication related questions as evidenced by patient report demonstrate understanding of rationale for each prescribed medication as evidenced by patient report attend all scheduled medical appointments as evidenced by patient report and EMR review demonstrate ongoing  adherence to prescribed treatment plan for LVA, stroke, MI, migraines, HTN, TIA, AVD, asthma, rhinitis, COPD, sinusitis, dermatitis, CKD, tobacco use, HLD    as evidenced by patient report and EMR review continue to work with RN Care Manager to address care management and care coordination needs related to LVA, stroke, MI, migraines, HTN, TIA, AVD,  asthma, rhinitis, COPD, sinusitis, dermatitis, CKD, tobacco use, HLD  as evidenced by adherence to CM Team Scheduled appointments through collaboration with RN Care manager, provider, and care team.   Interventions: Inter-disciplinary care team collaboration (see longitudinal plan of care) Evaluation of current treatment plan related to  self management and patient's adherence to plan as established by provider  Smoking Cessation Interventions:  (Status:  New goal.) Long Term Goal Reviewed smoking history:   currently smoking less than 1ppd ppd-patient has no desire to quit smoking Evaluation of current treatment plan reviewed Reviewed scheduled/upcoming provider appointments  Discussed plans with patient for ongoing care management follow up and provided patient with direct contact information for care management team Assessed social determinant of health barriers  Asthma: (Status:New goal.) Long Term Goal Discussed the importance of adequate rest and management of fatigue with Asthma Assessed social determinant of health barriers    Chronic Kidney Disease Interventions:  (Status:  New goal.) Long Term Goal Evaluation of current treatment plan related to chronic kidney disease self management and patient's adherence to plan as established by provider      Reviewed prescribed diet Reviewed medications with patient and discussed importance of compliance  Reviewed scheduled/upcoming provider appointments including    Discussed plans with patient for ongoing care management follow up and provided patient with direct contact information for care management team    Assessed social determinant of health barriers    Last practice recorded BP readings:  BP Readings from Last 3 Encounters:  03/17/22 (!) 141/63  03/01/22 136/72  03/01/22 133/71  05/19/22         104/52  Most recent eGFR/CrCl:  Lab Results  Component Value Date   EGFR 39 (L) 11/19/2021    No components found for:  "CRCL"  COPD Interventions:  (Status:  New goal.) Long Term Goal Discussed the importance of adequate rest and management of fatigue with COPD Assessed social determinant of health barriers  Hyperlipidemia Interventions:  (Status:  New goal.) Long Term Goal Medication review performed; medication list updated in electronic medical record.  Provider established cholesterol goals reviewed Counseled on importance of regular laboratory monitoring as prescribed Reviewed importance of limiting foods high in cholesterol Assessed social determinant of health barriers   Hypertension Interventions:  (Status:  New goal.) Long Term Goal Last practice recorded BP readings:  BP Readings from Last 3 Encounters:  03/17/22 (!) 141/63  03/01/22 136/72  03/01/22 133/71  03/22/22         114/66 Most recent eGFR/CrCl:  Lab Results  Component Value Date   EGFR 39 (L) 11/19/2021    No components found for: "CRCL"  Evaluation of current treatment plan related to hypertension self management and patient's adherence to plan as established by provider Reviewed medications with patient and discussed importance of compliance Discussed plans with patient for ongoing care management follow up and provided patient with direct contact information for care management team Reviewed scheduled/upcoming provider appointments including:  Discussed complications of poorly controlled blood pressure such as heart disease, stroke, circulatory complications, vision complications, kidney impairment, sexual dysfunction Assessed social determinant of health barriers  Patient Goals/Self-Care Activities: Take all medications as prescribed Attend all scheduled provider appointments Call pharmacy for medication refills 3-7 days in advance of running out of medications Perform all self care activities independently  Perform IADL's (shopping, preparing meals, housekeeping, managing finances) independently Call provider office for  new concerns or questions  Patient to follow up on dental providers/PCS services  Follow Up Plan:  The patient has been provided with contact information for the care management team and has been advised to call with any health related questions or concerns.  The care management team will reach out to the patient again over the next 30 business  days.    Long-Range Goal: Establish Plan of Care for Chronic Disease Management Needs   Priority: High  Note:   Timeframe:  Long-Range Goal Priority:  High Start Date:  06/24/22                        Expected End Date: ongoing                      Follow Up Date 05/20/22   - practice safe sex - schedule appointment for vaccines needed due to my age or health - schedule recommended health tests (blood work, mammogram, colonoscopy, pap test) - schedule and keep appointment for annual check-up    Why is this important?   Screening tests can find diseases early when they are easier to treat.  Your doctor or nurse will talk with you about which tests are important for  you.  Getting shots for common diseases like the flu and shingles will help prevent them.  05/20/22:  Patient seen by CARDS and PCP 05/04/22, NEURO 5/15.  Has upcoming appts with CARDS, PCP, and PT/OT   Follow Up:  Patient agrees to Care Plan and Follow-up.  Plan: The Managed Medicaid care management team will reach out to the patient again over the next 30 business  days. and The  Patient has been provided with contact information for the Managed Medicaid care management team and has been advised to call with any health related questions or concerns.  Date/time of next scheduled RN care management/care coordination outreach: 06/24/22 at 1245

## 2022-05-20 NOTE — Patient Instructions (Signed)
Hi [name], thank you for returning my call-have a nice afternoon.  [name] was given information about Medicaid Managed Care team care coordination services as a part of their [place] Medicaid benefit. [name] verbally consented to engagement with the [facility] Managed Care team.   If you are experiencing a medical emergency, please call 911 or report to your local emergency department or urgent care.   If you have a non-emergency medical problem during routine business hours, please contact your provider's office and ask to speak with a nurse.   For questions related to your [place] health plan, please call: [phone]  If you would like to schedule transportation through your [place] plan, please call the following number at least 2 days in advance of your appointment: [phone].   There is no limit to the number of trips during the year between medical appointments, healthcare facilities, or pharmacies. Transportation must be scheduled at least 2 business days before but not more than thirty 30 days before of your appointment.  Call the Behavioral Health Crisis Line at [phone], at any time, 24 hours a day, 7 days a week. If you are in danger or need immediate medical attention call 911.  If you would like help to quit smoking, call 1-800-QUIT-NOW (661-844-6131) OR Espaol: 1-855-Djelo-Ya (4-132-440-1027) o para ms informacin haga clic aqu or Text READY to 253-664 to register via text  Gabrielle Schultz - following are the goals we discussed in your visit today:  Timeframe:  Long-Range Goal Priority:  High Start Date:  06/24/22                        Expected End Date: ongoing                      Follow Up Date 05/20/22   - practice safe sex - schedule appointment for vaccines needed due to my age or health - schedule recommended health tests (blood work, mammogram, colonoscopy, pap test) - schedule and keep appointment  for annual check-up    Why is this important?   Screening tests can find diseases early when they are easier to treat.  Your doctor or nurse will talk with you about which tests are important for you.  Getting shots for common diseases like the flu and shingles will help prevent them.  05/20/22:  Patient seen by CARDS and PCP 05/04/22, NEURO 5/15.  Has upcoming appts with CARDS, PCP, and PT/OT  Patient verbalizes understanding of instructions and care plan provided today and agrees to view in MyChart. Active MyChart status and patient understanding of how to access instructions and care plan via MyChart confirmed with patient.     The Managed Medicaid care management team will reach out to the patient again over the next 30 business   days.  The  Patient  has been provided with contact information for the Managed Medicaid care management team and has been advised to call with any health related questions or concerns.   [name], BSN [place]  [facility] Care Management Coordinator - Managed [place] High Risk [phone]   Following is a copy of your plan of care:  Care Plan : RN Care Manager Plan of Care  Updates made by [name], RN since 05/20/2022 12:00 AM     Problem: Health Promotion or Disease Self-Management (General Plan of Care)      Long-Range  Goal: Chronic Disease Management   Start Date: 03/19/2022  Expected End Date: 08/20/2022  Priority: High  Note:   Current Barriers:  Knowledge Deficits related to plan of care for management of LVA, stroke, MI, migraines, HTN, TIA, AVD, asthma, rhinitis, COPD, sinusitis, dermatitis, CKD, tobacco use, HLD  Chronic Disease Management support and education needs related to LVA, stroke, MI, migraines, HTN, TIA, AVD, asthma, rhinitis, COPD, sinusitis, dermatitis, CKD, tobacco use, HLD  05/20/22:  Patient hospitalized 4/18-4/20 for CVA-to start PT/OT next week.  Smokes 1/2 ppd.  Patient to contact insurance  company today about dental providers.  Has eye provider.  Recent visits with NEURO/CARDS/PCP-follow up appts scheduled.  Patient to follow up on PCS services recommended for 2-3 days a week.    RNCM Clinical Goal(s):  Patient will verbalize understanding of plan for management of LVA, stroke, MI, migraines, HTN, TIA, AVD, asthma, rhinitis, COPD, sinusitis, dermatitis, CKD, tobacco use, HLD as evidenced by patient report verbalize basic understanding of LVA, stroke, MI, migraines, HTN, TIA, AVD, asthma, rhinitis, COPD, sinusitis, dermatitis, CKD, tobacco use, HLD   disease process and self health management plan as evidenced by patient report take all medications exactly as prescribed and will call provider for medication related questions as evidenced by patient report demonstrate understanding of rationale for each prescribed medication as evidenced by patient report attend all scheduled medical appointments as evidenced by patient report and EMR review demonstrate ongoing  adherence to prescribed treatment plan for LVA, stroke, MI, migraines, HTN, TIA, AVD, asthma, rhinitis, COPD, sinusitis, dermatitis, CKD, tobacco use, HLD    as evidenced by patient report and EMR review continue to work with RN Care Manager to address care management and care coordination needs related to LVA, stroke, MI, migraines, HTN, TIA, AVD, asthma, rhinitis, COPD, sinusitis, dermatitis, CKD, tobacco use, HLD  as evidenced by adherence to CM Team Scheduled appointments through collaboration with RN Care manager, provider, and care team.   Interventions: Inter-disciplinary care team collaboration (see longitudinal plan of care) Evaluation of current treatment plan related to  self management and patient's adherence to plan as established by provider  Smoking Cessation Interventions:  (Status:  New goal.) Long Term Goal Reviewed smoking history:   currently smoking less than 1ppd ppd-patient has no desire to quit  smoking Evaluation of current treatment plan reviewed Reviewed scheduled/upcoming provider appointments  Discussed plans with patient for ongoing care management follow up and provided patient with direct contact information for care management team Assessed social determinant of health barriers  Asthma: (Status:New goal.) Long Term Goal Discussed the importance of adequate rest and management of fatigue with Asthma Assessed social determinant of health barriers    Chronic Kidney Disease Interventions:  (Status:  New goal.) Long Term Goal Evaluation of current treatment plan related to chronic kidney disease self management and patient's adherence to plan as established by provider      Reviewed prescribed diet Reviewed medications with patient and discussed importance of compliance    Reviewed scheduled/upcoming provider appointments including    Discussed plans with patient for ongoing care management follow up and provided patient with direct contact information for care management team    Assessed social determinant of health barriers    Last practice recorded BP readings:  BP Readings from Last 3 Encounters:  03/17/22 (!) 141/63  03/01/22 136/72  03/01/22 133/71  05/19/22         104/52  Most recent eGFR/CrCl:  Lab Results  Component Value Date  EGFR 39 (L) 11/19/2021    No components found for: "CRCL"  COPD Interventions:  (Status:  New goal.) Long Term Goal Discussed the importance of adequate rest and management of fatigue with COPD Assessed social determinant of health barriers  Hyperlipidemia Interventions:  (Status:  New goal.) Long Term Goal Medication review performed; medication list updated in electronic medical record.  Provider established cholesterol goals reviewed Counseled on importance of regular laboratory monitoring as prescribed Reviewed importance of limiting foods high in cholesterol Assessed social determinant of health barriers   Hypertension  Interventions:  (Status:  New goal.) Long Term Goal Last practice recorded BP readings:  BP Readings from Last 3 Encounters:  03/17/22 (!) 141/63  03/01/22 136/72  03/01/22 133/71  03/22/22         114/66 Most recent eGFR/CrCl:  Lab Results  Component Value Date   EGFR 39 (L) 11/19/2021    No components found for: "CRCL"  Evaluation of current treatment plan related to hypertension self management and patient's adherence to plan as established by provider Reviewed medications with patient and discussed importance of compliance Discussed plans with patient for ongoing care management follow up and provided patient with direct contact information for care management team Reviewed scheduled/upcoming provider appointments including:  Discussed complications of poorly controlled blood pressure such as heart disease, stroke, circulatory complications, vision complications, kidney impairment, sexual dysfunction Assessed social determinant of health barriers  Patient Goals/Self-Care Activities: Take all medications as prescribed Attend all scheduled provider appointments Call pharmacy for medication refills 3-7 days in advance of running out of medications Perform all self care activities independently  Perform IADL's (shopping, preparing meals, housekeeping, managing finances) independently Call provider office for new concerns or questions  Patient to follow up on dental providers/PCS services  Follow Up Plan:  The patient has been provided with contact information for the care management team and has been advised to call with any health related questions or concerns.  The care management team will reach out to the patient again over the next 30 business  days.

## 2022-05-21 ENCOUNTER — Other Ambulatory Visit: Payer: Self-pay

## 2022-05-21 DIAGNOSIS — Z79899 Other long term (current) drug therapy: Secondary | ICD-10-CM

## 2022-05-21 MED ORDER — FUROSEMIDE 20 MG PO TABS: 20.0000 mg | ORAL_TABLET | ORAL | 3 refills | Status: DC

## 2022-05-26 ENCOUNTER — Ambulatory Visit: Payer: Medicaid Other | Attending: Nurse Practitioner

## 2022-05-27 ENCOUNTER — Ambulatory Visit: Payer: Medicaid Other | Admitting: Neurology

## 2022-06-02 ENCOUNTER — Ambulatory Visit: Payer: Medicaid Other

## 2022-06-08 ENCOUNTER — Ambulatory Visit: Payer: Medicaid Other | Admitting: Physical Therapy

## 2022-06-10 ENCOUNTER — Ambulatory Visit: Payer: Medicaid Other | Admitting: Physical Therapy

## 2022-06-15 ENCOUNTER — Ambulatory Visit: Payer: Medicaid Other | Admitting: Physical Therapy

## 2022-06-16 ENCOUNTER — Ambulatory Visit: Payer: Medicaid Other

## 2022-06-23 ENCOUNTER — Ambulatory Visit: Payer: Medicaid Other | Admitting: Physical Therapy

## 2022-06-23 ENCOUNTER — Ambulatory Visit: Payer: Medicaid Other | Attending: Cardiology | Admitting: Cardiology

## 2022-06-23 ENCOUNTER — Encounter: Payer: Self-pay | Admitting: Cardiology

## 2022-06-23 VITALS — BP 110/80 | HR 59 | Ht 61.0 in | Wt 163.4 lb

## 2022-06-23 DIAGNOSIS — I359 Nonrheumatic aortic valve disorder, unspecified: Secondary | ICD-10-CM | POA: Diagnosis not present

## 2022-06-23 DIAGNOSIS — I639 Cerebral infarction, unspecified: Secondary | ICD-10-CM

## 2022-06-23 DIAGNOSIS — I10 Essential (primary) hypertension: Secondary | ICD-10-CM

## 2022-06-23 DIAGNOSIS — I340 Nonrheumatic mitral (valve) insufficiency: Secondary | ICD-10-CM | POA: Diagnosis not present

## 2022-06-23 NOTE — Patient Instructions (Addendum)
Medication Instructions:  Your physician recommends that you continue on your current medications as directed. Please refer to the Current Medication list given to you today.  Labwork: None ordered.  Testing/Procedures: None ordered.  Follow-Up: As needed   Implantable Loop Recorder Placement, Care After This sheet gives you information about how to care for yourself after your procedure. Your health care provider may also give you more specific instructions. If you have problems or questions, contact your health care provider. What can I expect after the procedure? After the procedure, it is common to have: Soreness or discomfort near the incision. Some swelling or bruising near the incision.  Follow these instructions at home: Incision care  Monitor your cardiac device site for redness, swelling, and drainage. Call the device clinic at [phone] if you experience these symptoms or fever/chills.  Keep the large square bandage on your site for 24 hours and then you may remove it yourself. Keep the steri-strips underneath in place.   You may shower after 72 hours / 3 days from your procedure with the steri-strips in place. They will usually fall off on their own, or may be removed after 10 days. Pat dry.   Avoid lotions, ointments, or perfumes over your incision until it is well-healed.  Please do not submerge in water until your site is completely healed.   Your device is MRI compatible.   Remote monitoring is used to monitor your cardiac device from home. This monitoring is scheduled every month by our office. It allows us to keep an eye on the function of your device to ensure it is working properly.  If your wound site starts to bleed apply pressure.    For help with the monitor please call [employer] Monitor Support Specialist directly at [phone].    If you have any questions/concerns please call the device clinic at [phone].  Activity  Return to your  normal activities.  General instructions Follow instructions from your health care provider about how to manage your implantable loop recorder and transmit the information. Learn how to activate a recording if this is necessary for your type of device. You may go through a metal detection gate, and you may let someone hold a metal detector over your chest. Show your ID card if needed. Do not have an MRI unless you check with your health care provider first. Take over-the-counter and prescription medicines only as told by your health care provider. Keep all follow-up visits as told by your health care provider. This is important. Contact a health care provider if: You have redness, swelling, or pain around your incision. You have a fever. You have pain that is not relieved by your pain medicine. You have triggered your device because of fainting (syncope) or because of a heartbeat that feels like it is racing, slow, fluttering, or skipping (palpitations). Get help right away if you have: Chest pain. Difficulty breathing. Summary After the procedure, it is common to have soreness or discomfort near the incision. Change your dressing as told by your health care provider. Follow instructions from your health care provider about how to manage your implantable loop recorder and transmit the information. Keep all follow-up visits as told by your health care provider. This is important. This information is not intended to replace advice given to you by your health care provider. Make sure you discuss any questions you have with your health care provider. Document Released: 12/02/2014 Document Revised: 02/05/2017 Document Reviewed: 02/05/2017 Elsevier Patient Education  2020   Elsevier Inc.  

## 2022-06-23 NOTE — Progress Notes (Signed)
Electrophysiology Office Note:    Date:  06/23/2022   ID:  Gabrielle Schultz, DOB 10-10-1970, MRN [id number]  [facility] Cardiologist:  Julien Nordmann, MD  [facility] Electrophysiologist:  Lanier Prude, MD   Referring MD: Marianne Sofia, PA-C   Chief Complaint: Stroke  History of Present Illness:    Gabrielle Schultz is a 52 y.o. 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  The patient has a history of valvular heart disease with AAS/AI, moderate MR.  No history of atrial fibrillation.  She has had 2 strokes since the beginning of 2024.  She is referred for consideration of loop recorder monitoring.  She had a transesophageal echo in March 2024.  Based on the results of that echo, cardiothoracic surgery referral was recommended by Dr. Lynnette Caffey for aortic and mitral valve replacements.  She has not seen the cardiothoracic surgeon yet.  No known history of atrial fibrillation.      Their past medical, social and family history was reveiwed.   ROS:   Please see the history of present illness.    All other systems reviewed and are negative.  EKGs/Labs/Other Studies Reviewed:    The following studies were reviewed today:  March 17, 2022 transesophageal echo  1. Left ventricular ejection fraction, by estimation, is 60 to 65%. The  left ventricle has normal function. The left ventricle has no regional  wall motion abnormalities. Left ventricular diastolic parameters are  consistent with Grade II diastolic  dysfunction (pseudonormalization). Elevated left atrial pressure.   2. Right ventricular systolic function is normal. The right ventricular  size is normal.   3. Left atrial size was moderately dilated. No left atrial/left atrial  appendage thrombus was detected.   4. The mitral valve is rheumatic. Mild to moderate mitral valve  regurgitation.   5. The aortic valve is trileaflet with commisural  thickening and  nodularity suggestive of post-inflammatory or rheumatic changes. 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 There is near-holodiastolic reversal of flow in  the descending aorta. 3D guided ERO  area is 0.4 cm sq. The AR vena contracta is 6 mm. The AR pressure half  time is 286 ms. All these values are at least borderline for severe AR.Marland Kitchen  The aortic valve is tricuspid. There is mild calcification of the aortic  valve. There is moderate thickening  of the aortic valve. Aortic valve regurgitation is moderate to severe.  Mild aortic valve stenosis. Aortic valve mean gradient measures 11.0 mmHg.  Aortic valve Vmax measures 2.34 m/s. Aortic valve acceleration time  measures 66 msec.   6. Agitated saline contrast bubble study was negative, with no evidence  of any interatrial shunt.     Physical Exam:    VS:  BP 110/80 (BP Location: Left Arm, Patient Position: Sitting, Cuff Size: Normal)   Pulse (!) 59   Ht 5\' 1"  (1.549 m)   Wt 163 lb 6 oz (74.1 kg)   LMP 08/18/2020 (Approximate) Comment: Tubal ligation  SpO2 99%   BMI 30.87 kg/m     Wt Readings from Last 3 Encounters:  06/23/22 163 lb 6 oz (74.1 kg)  05/19/22 164 lb 6.4 oz (74.6 kg)  05/04/22 163 lb (73.9 kg)     GEN:  Well nourished, well developed in no acute distress CARDIAC: RRR, no murmurs, rubs, gallops RESPIRATORY:  Clear to auscultation without rales, wheezing or rhonchi       ASSESSMENT AND  PLAN:    1. Cerebrovascular accident (CVA), unspecified mechanism (HCC)   2. Aortic valvular disease   3. Nonrheumatic mitral valve regurgitation   4. Primary hypertension     #History of stroke Unclear etiology.  She has valvular heart disease with significant aortic regurgitation and mitral regurgitation.  No known history of atrial fibrillation although she is certainly at high risk given her valvular heart disease.  I discussed loop recorder monitoring for atrial fibrillation surveillance.  I discussed the loop  recorder implant procedure including the risks and associated monthly monitoring cost and she wishes to proceed.  #Aortic and mitral valve diseases Follows with Dr. Lynnette Caffey.  Was scheduled to see Dr. Leafy Ro.  Will make sure she has an appoint with Dr. Leafy Ro for follow-up.  #Hypertension At goal today.  Recommend checking blood pressures 1-2 times per week at home and recording the values.  Recommend bringing these recordings to the primary care physician.      Signed, Gabrielle Muskrat. Lalla Brothers, MD, [facility], [facility] 06/23/2022 3:25 PM    Electrophysiology [facility]  -------------------------  SURGEON:  Lanier Prude, MD     PREPROCEDURE DIAGNOSIS:  Cryptogenic stroke    POSTPROCEDURE DIAGNOSIS: Cryptogenic stroke     PROCEDURES:   1. Implantable loop recorder implantation    INTRODUCTION:  Gabrielle Schultz presents with a history of cryptogenic stroke The costs of loop recorder monitoring have been discussed with the patient.    DESCRIPTION OF PROCEDURE:  Informed written consent was obtained.  A preprocedural timeout was performed with the RN Gabrielle Schultz). The patient required no sedation for the procedure today.  Mapping over the patient's chest was performed to identify the area where electrograms were most prominent for ILR recording.  This area was found to be the left parasternal region over the 4th intercostal space. The patients left chest was therefore prepped and draped in the usual sterile fashion. The skin overlying the left parasternal region was infiltrated with lidocaine for local analgesia.  A 0.5-cm incision was made over the left parasternal region over the 3rd intercostal space.  A subcutaneous ILR pocket was fashioned using a combination of sharp and blunt dissection.  A Medtronic Reveal LINQ 2 [id number]) implantable loop recorder was then placed into the pocket  R waves were very prominent and measured >0.72mV.  Steri- Strips and a sterile dressing  were then applied.  There were no early apparent complications.     CONCLUSIONS:   1. Successful implantation of a implantable loop recorder for Cryptogenic stroke  2. No early apparent complications.   Gabrielle Lang T. Lalla Brothers, MD, [facility], [facility] Cardiac Electrophysiology

## 2022-06-24 ENCOUNTER — Other Ambulatory Visit: Payer: Medicaid Other | Admitting: Obstetrics and Gynecology

## 2022-06-24 ENCOUNTER — Encounter: Payer: Self-pay | Admitting: Obstetrics and Gynecology

## 2022-06-24 ENCOUNTER — Telehealth: Payer: Self-pay | Admitting: Physician Assistant

## 2022-06-24 NOTE — Telephone Encounter (Signed)
Dr. Lynnette Caffey had previously reviewed her TEE and felt she required dental clearance, heart cath and referral to Dr. Leafy Ro for surgical repair of valvular heart disease. She was seen in the office by Cadence Furth in April but setting up cath was deferred given recent CVA.  Recently seen by Dr. Lalla Brothers for [place] placement given recurrent CVAs. It was discovered that she still had not seen Dr. Leafy Ro and he asked that we make sure she gets back in.   Dr. Leafy Ro cannot evaluate her until she has been cleared by a dentist and had L/RHC. Only certain dentists will take her insurance and the first available apt she was able to get was mid August. She has an appt with Dr. Mariah Milling in on 7/30 at which time she can get set up for a heart cath and referred back to Dr. Leafy Ro for an apt. I spoke to the patient who understood and was in agreement with the plan. Thankfully, she is not having progressive or life limiting symptoms at this time. I told her to reach back out if that changes.   Cline Crock PA-C  [facility]

## 2022-06-24 NOTE — Patient Outreach (Signed)
Medicaid Managed Care   Nurse Care Manager Note  06/24/2022 Name:  Gabrielle Schultz MRN:  [id number] DOB:  06/01/1970  Gabrielle Schultz is an 52 y.o. year old female who is a primary patient of Gabrielle Kuster, NP.  The [facility] Managed Care Coordination team was consulted for assistance with:    Chronic healthcare management needs, HLD, LVA, sinusitis, dermatitis, CKD, tobacco use, gout, h/o multiple strokes, MI. 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  Gabrielle Schultz was given information about Medicaid Managed Care Coordination team services today. Gabrielle Schultz Patient agreed to services and verbal consent obtained.  Engaged with patient by telephone for follow up visit in response to provider referral for case management and/or care coordination services.   Assessments/Interventions:  Review of past medical history, allergies, medications, health status, including review of consultants reports, laboratory and other test data, was performed as part of comprehensive evaluation and provision of chronic care management services.  SDOH (Social Determinants of Health) assessments and interventions performed: SDOH Interventions    Flowsheet Row Patient Outreach Telephone from 06/24/2022 in [place] [facility] DEPARTMENT Patient Outreach Telephone from 05/20/2022 in [place] POPULATION HEALTH DEPARTMENT Telephone from 04/27/2022 in [facility] Care Coordination Patient Outreach Telephone from 04/20/2022 in  POPULATION HEALTH DEPARTMENT Office Visit from 05/22/2020 in [facility]  SDOH Interventions       Food Insecurity Interventions -- -- -- -- Other (Comment)  [food bank and husband's income]  Transportation Interventions -- -- Other (Comment)  [discussed with pt transportation available through Medicaid-she does not use them as she is unable to take her 50yr old along  the ride with her-states she is able to get family to get her to appts] -- Intervention Not Indicated  Utilities Interventions -- -- -- Intervention Not Indicated --  Alcohol Usage Interventions Intervention Not Indicated (Score <7) -- -- -- --  Financial Strain Interventions -- Intervention Not Indicated -- -- --  Physical Activity Interventions -- Other (Comments)  [patient starting PT] -- -- --  Stress Interventions -- -- -- Intervention Not Indicated --     Care Plan  Allergies  Allergen Reactions   Coconut (Cocos Nucifera) Hives and Swelling    Just coconut ("all coconut")   Amoxicillin Other (See Comments)    "makes me sick" "swelled up everywhere" Has patient had a PCN reaction causing immediate rash, facial/tongue/throat swelling, SOB or lightheadedness with hypotension: No Has patient had a PCN reaction causing severe rash involving mucus membranes or skin necrosis: No Has patient had a PCN reaction that required hospitalization: No Has patient had a PCN reaction occurring within the last 10 years: No If all of the above answers are "NO", then may proceed with Cephalosporin use.     Penicillins Other (See Comments)    "makes me sick" "swelled up oil" Has patient had a PCN reaction causing immediate rash, facial/tongue/throat swelling, SOB or lightheadedness with hypotension: No Has patient had a PCN reaction causing severe rash involving mucus membranes or skin necrosis: No Has patient had a PCN reaction that required hospitalization: No Has patient had a PCN reaction occurring within the last 10 years: Yes If all of the above answers are "NO", then may proceed with Cephalosporin use.    Singulair [Montelukast Sodium] Rash    "made heart race"    Medications Reviewed Today     Reviewed by [name],  RN (Registered Nurse) on 06/24/22 at 1311  Med List Status: <None>   Medication Order Taking? Sig Documenting Provider Last Dose Status Informant  acetaminophen  (TYLENOL) 650 MG CR tablet [id number] No Take 650-1,300 mg by mouth every 8 (eight) hours as needed for pain. [provider] Taking Active Self, Pharmacy Records  albuterol (PROVENTIL HFA) 108 (90 Base) MCG/ACT inhaler [id number] No Inhale 2 puffs into the lungs once every 4 (four) hours as needed for wheezing (cough). Gabrielle Kuster, NP Taking Active Self, Pharmacy Records  allopurinol (ZYLOPRIM) 100 MG tablet [id number] No TAKE ONE TABLET BY MOUTH ONCE DAILY.  Patient taking differently: Take 100 mg by mouth daily.   Gabrielle Kuster, NP Taking Active Self, Pharmacy Records  amLODipine (NORVASC) 5 MG tablet [id number] No Take 1 tablet (5 mg total) by mouth daily. Gabrielle Kuster, NP Taking Active   aspirin 81 MG chewable tablet [id number] No Chew 1 tablet (81 mg total) by mouth daily. Mathews Argyle, NP Taking Active   atorvastatin (LIPITOR) 80 MG tablet [id number] No Take 1 tablet (80 mg total) by mouth at bedtime. Gabrielle Kuster, NP Taking Active   cetirizine (ZYRTEC) 10 MG tablet [id number] No Take 1 tablet (10 mg total) by mouth daily. Gabrielle Kuster, NP Taking Active   cloNIDine (CATAPRES) 0.3 MG tablet [id number] No Take 1 tablet by mouth 3 times daily (morning, noon, and evening). Gabrielle Kuster, NP Taking Active   clopidogrel (PLAVIX) 75 MG tablet [id number] No Take 1 tablet (75 mg total) by mouth daily. Gabrielle Kuster, NP Taking Active   docusate sodium (COLACE) 100 MG capsule [id number] No Take 100 mg by mouth in the morning. [provider] Taking Active Self, Pharmacy Records  fluticasone (FLONASE) 50 MCG/ACT nasal spray [id number] No USE 1 SPRAY INTO BOTH NOSTRILS 2 TIMES A DAY  Patient taking differently: Place 1 spray into both nostrils at bedtime. USE 1 SPRAY INTO BOTH NOSTRILS 2 TIMES A DAY   Wieting, Richard, MD Taking Active Self, Pharmacy Records  fluticasone-salmeterol [facility] INHUB) 250-50 MCG/ACT AEPB [id number] No Inhale one puff into the lungs in  the morning and at bedtime Gabrielle Kuster, NP Taking Active Self, Pharmacy Records  furosemide (LASIX) 20 MG tablet [id number] No Take 1 tablet (20 mg total) by mouth every other day. Fransico Michael, Cadence H, PA-C Taking Active   losartan (COZAAR) 50 MG tablet [id number] No TAKE 1 TABLET(50 MG) BY MOUTH DAILY Furth, Cadence H, PA-C Taking Active   nystatin (MYCOSTATIN/NYSTOP) powder [id number] No Apply 1 Application topically 3 (three) times daily. Gabrielle Kuster, NP Taking Active   omeprazole (PRILOSEC) 20 MG capsule [id number] No Take 1 capsule (20 mg total) by mouth daily before breakfast. Gabrielle Kuster, NP Taking Active Self, Pharmacy Records  pantoprazole (PROTONIX) 40 MG tablet [id number] No Take 1 tablet (40 mg total) by mouth 2 (two) times daily before a meal. Abernathy, Alyssa, NP Taking Active   potassium chloride (KLOR-CON M) 10 MEQ tablet [id number] No Take 1 tablet (10 mEq total) by mouth daily. To take whenever you take lasix Gabrielle Kuster, NP Taking Active Self, Pharmacy Records  promethazine (PHENERGAN) 25 MG tablet [id number] No Take 1 tablet (25 mg total) by mouth as needed for nausea or vomiting. Gabrielle Kuster, NP Taking Active Self, Pharmacy Records  Ubrogepant (UBRELVY) 100 MG TABS [id number] No Take by mouth daily as needed. 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) 01/10/2022   Urge and stress incontinence 08/27/2021   COPD (chronic obstructive pulmonary disease) (HCC) 08/19/2021   History of TIA (transient ischemic attack) 07/22/2020   Hyperlipidemia    History of MI (myocardial infarction)    Migraines    Essential hypertension 12/23/2018   Chronic kidney disease 11/21/2013   Conditions to be addressed/monitored per PCP order:  Chronic healthcare  management needs, HLD, LVA, sinusitis, dermatitis, CKD, tobacco use, gout, h/o multiple strokes, MI, Migraines, HTN, TIA, AVD, asthma, rhinitis, COPD, valvular heart disease with aortic and mitral valve regurgitatio.  Care Plan : RN Care Manager Plan of Care  Updates made by [name], RN since 06/24/2022 12:00 AM     Problem: Health Promotion or Disease Self-Management (General Plan of Care)      Long-Range Goal: Chronic Disease Management   Start Date: 03/19/2022  Expected End Date: 09/24/2022  Priority: High  Note:   Current Barriers:  Knowledge Deficits related to plan of care for management of LVA, stroke, MI, migraines, HTN, TIA, AVD, asthma, rhinitis, COPD, sinusitis, dermatitis, CKD, tobacco use, HLD  Chronic Disease Management support and education needs related to LVA, stroke, MI, migraines, HTN, TIA, AVD, asthma, rhinitis, COPD, sinusitis, dermatitis, CKD, tobacco use, HLD  06/24/22:  Patient with loop recorder implanted yesterday.  Has dental appt next month.  Patient states she has not attended PT/OT due to transportation issues-discussed [place] Complete transportation available to patient.  BP stable at [facility] appt yesterday.  RNCM Clinical Goal(s):  Patient will verbalize understanding of plan for management of LVA, stroke, MI, migraines, HTN, TIA, AVD, asthma, rhinitis, COPD, sinusitis, dermatitis, CKD, tobacco use, HLD as evidenced by patient report verbalize basic understanding of LVA, stroke, MI, migraines, HTN, TIA, AVD, asthma, rhinitis, COPD, sinusitis, dermatitis, CKD, tobacco use, HLD   disease process and self health management plan as evidenced by patient report take all medications exactly as prescribed and will call provider for medication related questions as evidenced by patient report demonstrate understanding of rationale for each prescribed medication as evidenced by patient report attend all scheduled medical appointments as evidenced by patient report and  EMR review demonstrate ongoing  adherence to prescribed treatment plan for LVA, stroke, MI, migraines, HTN, TIA, AVD, asthma, rhinitis, COPD, sinusitis, dermatitis, CKD, tobacco use, HLD    as evidenced by patient report and EMR review continue to work with RN Care Manager to address care management and care coordination needs related to LVA, stroke, MI, migraines, HTN, TIA, AVD, asthma, rhinitis, COPD, sinusitis, dermatitis, CKD, tobacco use, HLD  as evidenced by adherence to CM Team Scheduled appointments through collaboration with RN Care manager, provider, and care team.   Interventions: Inter-disciplinary care team collaboration (see longitudinal plan of care) Evaluation of current treatment plan related to  self management and patient's adherence to plan as established by provider  Smoking Cessation Interventions:  (Status:  New goal.) Long Term Goal Reviewed smoking history:   currently smoking less than 1ppd ppd-patient has no desire to quit smoking Evaluation of current treatment plan reviewed Reviewed scheduled/upcoming provider appointments  Discussed plans with patient for ongoing care management follow up and provided patient with direct contact information for care management team Assessed social determinant of health barriers  Asthma: (Status:New goal.) Long Term Goal Discussed the importance of adequate rest and management of fatigue with Asthma Assessed social determinant  of health barriers    Chronic Kidney Disease Interventions:  (Status:  New goal.) Long Term Goal Evaluation of current treatment plan related to chronic kidney disease self management and patient's adherence to plan as established by provider      Reviewed prescribed diet Reviewed medications with patient and discussed importance of compliance    Reviewed scheduled/upcoming provider appointments including    Discussed plans with patient for ongoing care management follow up and provided patient with direct  contact information for care management team    Assessed social determinant of health barriers    Last practice recorded BP readings:  BP Readings from Last 3 Encounters:  03/17/22 (!) 141/63  03/01/22 136/72  03/01/22 133/71  05/19/22         104/52 06/23/22         110/80  Most recent eGFR/CrCl:  Lab Results  Component Value Date   EGFR 39 (L) 11/19/2021    No components found for: "CRCL"  COPD Interventions:  (Status:  New goal.) Long Term Goal Discussed the importance of adequate rest and management of fatigue with COPD Assessed social determinant of health barriers  Hyperlipidemia Interventions:  (Status:  New goal.) Long Term Goal Medication review performed; medication list updated in electronic medical record.  Provider established cholesterol goals reviewed Counseled on importance of regular laboratory monitoring as prescribed Reviewed importance of limiting foods high in cholesterol Assessed social determinant of health barriers   Hypertension Interventions:  (Status:  New goal.) Long Term Goal Last practice recorded BP readings:  BP Readings from Last 3 Encounters:  03/17/22 (!) 141/63  03/01/22 136/72  03/01/22 133/71  03/22/22         114/66 06/23/22         110/80  Most recent eGFR/CrCl:  Lab Results  Component Value Date   EGFR 39 (L) 11/19/2021    No components found for: "CRCL"  Evaluation of current treatment plan related to hypertension self management and patient's adherence to plan as established by provider Reviewed medications with patient and discussed importance of compliance Discussed plans with patient for ongoing care management follow up and provided patient with direct contact information for care management team Reviewed scheduled/upcoming provider appointments including:  Discussed complications of poorly controlled blood pressure such as heart disease, stroke, circulatory complications, vision complications, kidney impairment, sexual  dysfunction Assessed social determinant of health barriers  Patient Goals/Self-Care Activities: Take all medications as prescribed Attend all scheduled provider appointments Call pharmacy for medication refills 3-7 days in advance of running out of medications Perform all self care activities independently  Perform IADL's (shopping, preparing meals, housekeeping, managing finances) independently Call provider office for new concerns or questions  Patient to follow up on dental providers/PCS services 06/24/22:  patient has dental appt in July, patient to f/u regarding PT/OT-discussed [place] Complete transportation  Follow Up Plan:  The patient has been provided with contact information for the care management team and has been advised to call with any health related questions or concerns.  The care management team will reach out to the patient again over the next 45 business  days.    Long-Range Goal: Establish Plan of Care for Chronic Disease Management Needs   Priority: High  Note:   Timeframe:  Long-Range Goal Priority:  High Start Date:  06/24/22                        Expected End Date: ongoing  Follow Up Date 08/05/22   - practice safe sex - schedule appointment for vaccines needed due to my age or health - schedule recommended health tests (blood work, mammogram, colonoscopy, pap test) - schedule and keep appointment for annual check-up    Why is this important?   Screening tests can find diseases early when they are easier to treat.  Your doctor or nurse will talk with you about which tests are important for you.  Getting shots for common diseases like the flu and shingles will help prevent them.  06/24/22:  Seen by [facility] yesterday, has upcoming PCP and NEURO appt.  Dentist in July   Follow Up:  Patient agrees to Care Plan and Follow-up.  Plan: The Managed Medicaid care management team will reach out to the patient again over the next 45 business  days.  and The  Patient has been provided with contact information for the Managed Medicaid care management team and has been advised to call with any health related questions or concerns.  Date/time of next scheduled RN care management/care coordination outreach: 08/05/22 at 0900

## 2022-06-24 NOTE — Patient Instructions (Signed)
Visit Information  Gabrielle Schultz was given information about Medicaid Managed Care team care coordination services as a part of their [place] Medicaid benefit. Gabrielle Schultz verbally consented to engagement with the [facility] Managed Care team.   If you are experiencing a medical emergency, please call 911 or report to your local emergency department or urgent care.   If you have a non-emergency medical problem during routine business hours, please contact your provider's office and ask to speak with a nurse.   For questions related to your [place] health plan, please call: [phone]  If you would like to schedule transportation through your [place] Medicaid plan, please call the following number at least 2 days in advance of your appointment: [phone].   There is no limit to the number of trips during the year between medical appointments, healthcare facilities, or pharmacies. Transportation must be scheduled at least 2 business days before but not more than thirty 30 days before of your appointment.  Call the Behavioral Health Crisis Line at [phone], at any time, 24 hours a day, 7 days a week. If you are in danger or need immediate medical attention call 911.  If you would like help to quit smoking, call 1-800-QUIT-NOW (8080434920) OR Espaol: 1-855-Djelo-Ya (0-347-425-9563) o para ms informacin haga clic aqu or Text READY to 875-643 to register via text  Gabrielle Schultz - following are the goals we discussed in your visit today:  Timeframe:  Long-Range Goal Priority:  High Start Date:  06/24/22                        Expected End Date: ongoing                      Follow Up Date 08/05/22   - practice safe sex - schedule appointment for vaccines needed due to my age or health - schedule recommended health tests (blood work, mammogram, colonoscopy, pap test) - schedule and keep appointment for annual check-up    Why is this important?    Screening tests can find diseases early when they are easier to treat.  Your doctor or nurse will talk with you about which tests are important for you.  Getting shots for common diseases like the flu and shingles will help prevent them.  06/24/22:  Seen by [facility] yesterday, has upcoming PCP and NEURO appt.  Dentist in July  Patient verbalizes understanding of instructions and care plan provided today and agrees to view in MyChart. Active MyChart status and patient understanding of how to access instructions and care plan via MyChart confirmed with patient.     The Managed Medicaid care management team will reach out to the patient again over the next 45 business  days.  The  Patient has been provided with contact information for the Managed Medicaid care management team and has been advised to call with any health related questions or concerns.   [name], BSN [place]  [facility] Care Management Coordinator - Managed Medicaid High Risk [phone]   Following is a copy of your plan of care:  Care Plan : RN Care Manager Plan of Care  Updates made by [name], RN since 06/24/2022 12:00 AM     Problem: Health Promotion or Disease Self-Management (General Plan of Care)      Long-Range Goal: Chronic Disease Management   Start Date: 03/19/2022  Expected End Date: 09/24/2022  Priority:  High  Note:   Current Barriers:  Knowledge Deficits related to plan of care for management of LVA, stroke, MI, migraines, HTN, TIA, AVD, asthma, rhinitis, COPD, sinusitis, dermatitis, CKD, tobacco use, HLD  Chronic Disease Management support and education needs related to LVA, stroke, MI, migraines, HTN, TIA, AVD, asthma, rhinitis, COPD, sinusitis, dermatitis, CKD, tobacco use, HLD  06/24/22:  Patient with loop recorder implanted yesterday.  Has dental appt next month.  Patient states she has not attended PT/OT due to transportation issues-discussed [place] Complete transportation  available to patient.  BP stable at [facility] appt yesterday.  RNCM Clinical Goal(s):  Patient will verbalize understanding of plan for management of LVA, stroke, MI, migraines, HTN, TIA, AVD, asthma, rhinitis, COPD, sinusitis, dermatitis, CKD, tobacco use, HLD as evidenced by patient report verbalize basic understanding of LVA, stroke, MI, migraines, HTN, TIA, AVD, asthma, rhinitis, COPD, sinusitis, dermatitis, CKD, tobacco use, HLD   disease process and self health management plan as evidenced by patient report take all medications exactly as prescribed and will call provider for medication related questions as evidenced by patient report demonstrate understanding of rationale for each prescribed medication as evidenced by patient report attend all scheduled medical appointments as evidenced by patient report and EMR review demonstrate ongoing  adherence to prescribed treatment plan for LVA, stroke, MI, migraines, HTN, TIA, AVD, asthma, rhinitis, COPD, sinusitis, dermatitis, CKD, tobacco use, HLD    as evidenced by patient report and EMR review continue to work with RN Care Manager to address care management and care coordination needs related to LVA, stroke, MI, migraines, HTN, TIA, AVD, asthma, rhinitis, COPD, sinusitis, dermatitis, CKD, tobacco use, HLD  as evidenced by adherence to CM Team Scheduled appointments through collaboration with RN Care manager, provider, and care team.   Interventions: Inter-disciplinary care team collaboration (see longitudinal plan of care) Evaluation of current treatment plan related to  self management and patient's adherence to plan as established by provider  Smoking Cessation Interventions:  (Status:  New goal.) Long Term Goal Reviewed smoking history:   currently smoking less than 1ppd ppd-patient has no desire to quit smoking Evaluation of current treatment plan reviewed Reviewed scheduled/upcoming provider appointments  Discussed plans with patient for  ongoing care management follow up and provided patient with direct contact information for care management team Assessed social determinant of health barriers  Asthma: (Status:New goal.) Long Term Goal Discussed the importance of adequate rest and management of fatigue with Asthma Assessed social determinant of health barriers    Chronic Kidney Disease Interventions:  (Status:  New goal.) Long Term Goal Evaluation of current treatment plan related to chronic kidney disease self management and patient's adherence to plan as established by provider      Reviewed prescribed diet Reviewed medications with patient and discussed importance of compliance    Reviewed scheduled/upcoming provider appointments including    Discussed plans with patient for ongoing care management follow up and provided patient with direct contact information for care management team    Assessed social determinant of health barriers    Last practice recorded BP readings:  BP Readings from Last 3 Encounters:  03/17/22 (!) 141/63  03/01/22 136/72  03/01/22 133/71  05/19/22         104/52 06/23/22         110/80  Most recent eGFR/CrCl:  Lab Results  Component Value Date   EGFR 39 (L) 11/19/2021    No components found for: "CRCL"  COPD Interventions:  (Status:  New  goal.) Long Term Goal Discussed the importance of adequate rest and management of fatigue with COPD Assessed social determinant of health barriers  Hyperlipidemia Interventions:  (Status:  New goal.) Long Term Goal Medication review performed; medication list updated in electronic medical record.  Provider established cholesterol goals reviewed Counseled on importance of regular laboratory monitoring as prescribed Reviewed importance of limiting foods high in cholesterol Assessed social determinant of health barriers   Hypertension Interventions:  (Status:  New goal.) Long Term Goal Last practice recorded BP readings:  BP Readings from Last 3  Encounters:  03/17/22 (!) 141/63  03/01/22 136/72  03/01/22 133/71  03/22/22         114/66 06/23/22         110/80  Most recent eGFR/CrCl:  Lab Results  Component Value Date   EGFR 39 (L) 11/19/2021    No components found for: "CRCL"  Evaluation of current treatment plan related to hypertension self management and patient's adherence to plan as established by provider Reviewed medications with patient and discussed importance of compliance Discussed plans with patient for ongoing care management follow up and provided patient with direct contact information for care management team Reviewed scheduled/upcoming provider appointments including:  Discussed complications of poorly controlled blood pressure such as heart disease, stroke, circulatory complications, vision complications, kidney impairment, sexual dysfunction Assessed social determinant of health barriers  Patient Goals/Self-Care Activities: Take all medications as prescribed Attend all scheduled provider appointments Call pharmacy for medication refills 3-7 days in advance of running out of medications Perform all self care activities independently  Perform IADL's (shopping, preparing meals, housekeeping, managing finances) independently Call provider office for new concerns or questions  Patient to follow up on dental providers/PCS services 06/24/22:  patient has dental appt in July, patient to f/u regarding PT/OT-discussed  Complete transportation  Follow Up Plan:  The patient has been provided with contact information for the care management team and has been advised to call with any health related questions or concerns.  The care management team will reach out to the patient again over the next 45 business  days.

## 2022-06-28 ENCOUNTER — Ambulatory Visit: Payer: Medicaid Other

## 2022-06-30 ENCOUNTER — Ambulatory Visit (INDEPENDENT_AMBULATORY_CARE_PROVIDER_SITE_OTHER): Payer: Medicaid Other | Admitting: Nurse Practitioner

## 2022-06-30 ENCOUNTER — Encounter: Payer: Self-pay | Admitting: Nurse Practitioner

## 2022-06-30 VITALS — BP 130/88 | HR 60 | Temp 98.5°F | Resp 16 | Ht 61.0 in | Wt 165.2 lb

## 2022-06-30 DIAGNOSIS — I38 Endocarditis, valve unspecified: Secondary | ICD-10-CM

## 2022-06-30 DIAGNOSIS — I69398 Other sequelae of cerebral infarction: Secondary | ICD-10-CM | POA: Diagnosis not present

## 2022-06-30 DIAGNOSIS — J3089 Other allergic rhinitis: Secondary | ICD-10-CM | POA: Diagnosis not present

## 2022-06-30 DIAGNOSIS — M109 Gout, unspecified: Secondary | ICD-10-CM | POA: Diagnosis not present

## 2022-06-30 DIAGNOSIS — R269 Unspecified abnormalities of gait and mobility: Secondary | ICD-10-CM | POA: Diagnosis not present

## 2022-06-30 DIAGNOSIS — Z0001 Encounter for general adult medical examination with abnormal findings: Secondary | ICD-10-CM

## 2022-06-30 DIAGNOSIS — J0110 Acute frontal sinusitis, unspecified: Secondary | ICD-10-CM

## 2022-06-30 DIAGNOSIS — I10 Essential (primary) hypertension: Secondary | ICD-10-CM | POA: Diagnosis not present

## 2022-06-30 DIAGNOSIS — Z79899 Other long term (current) drug therapy: Secondary | ICD-10-CM

## 2022-06-30 MED ORDER — CETIRIZINE HCL 10 MG PO TABS: 10.0000 mg | ORAL_TABLET | Freq: Every day | ORAL | 1 refills | Status: DC

## 2022-06-30 MED ORDER — AMLODIPINE BESYLATE 5 MG PO TABS: 5.0000 mg | ORAL_TABLET | Freq: Every day | ORAL | 1 refills | Status: DC

## 2022-06-30 MED ORDER — DOXYCYCLINE HYCLATE 100 MG PO TABS
100.0000 mg | ORAL_TABLET | Freq: Two times a day (BID) | ORAL | 0 refills | Status: AC
Start: 2022-06-30 — End: 2022-07-10

## 2022-06-30 NOTE — Progress Notes (Signed)
[facility] [address] [place], [place] [address]  Internal MEDICINE  Office Visit Note  Patient Name: ZYNIYA LACONTE  [id number]  [id number]  Date of Service: 06/30/2022  Chief Complaint  Patient presents with   Gastroesophageal Reflux   Hypertension   Annual Exam    HPI Sai presents for an annual well visit and physical exam.  Well-appearing 52 y.o. 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  Has gout, takes allopurinol but it is not helping to prevent gout attacks, has not had her uric acid level checked recently.  Was prediabetic with A1c of 5.9 in January, and it improved to 5.5 as of April.  Current Medication: Outpatient Encounter Medications as of 06/30/2022  Medication Sig   acetaminophen (TYLENOL) 650 MG CR tablet Take 650-1,300 mg by mouth every 8 (eight) hours as needed for pain.   albuterol (PROVENTIL HFA) 108 (90 Base) MCG/ACT inhaler Inhale 2 puffs into the lungs once every 4 (four) hours as needed for wheezing (cough).   allopurinol (ZYLOPRIM) 100 MG tablet TAKE ONE TABLET BY MOUTH ONCE DAILY. (Patient taking differently: Take 100 mg by mouth daily.)   aspirin 81 MG chewable tablet Chew 1 tablet (81 mg total) by mouth daily.   atorvastatin (LIPITOR) 80 MG tablet Take 1 tablet (80 mg total) by mouth at bedtime.   cloNIDine (CATAPRES) 0.3 MG tablet Take 1 tablet by mouth 3 times daily (morning, noon, and evening).   clopidogrel (PLAVIX) 75 MG tablet Take 1 tablet (75 mg total) by mouth daily.   docusate sodium (COLACE) 100 MG capsule Take 100 mg by mouth in  the morning.   doxycycline (VIBRA-TABS) 100 MG tablet Take 1 tablet (100 mg total) by mouth 2 (two) times daily for 10 days. Take with food.   fluticasone (FLONASE) 50 MCG/ACT nasal spray USE 1 SPRAY INTO BOTH NOSTRILS 2 TIMES A DAY (Patient taking differently: Place 1 spray into both nostrils at bedtime. USE 1 SPRAY INTO BOTH NOSTRILS 2 TIMES A DAY)   fluticasone-salmeterol (WIXELA INHUB) 250-50 MCG/ACT AEPB Inhale one puff into the lungs in the morning and at bedtime   furosemide (LASIX) 20 MG tablet Take 1 tablet (20 mg total) by mouth every other day.   losartan (COZAAR) 50 MG tablet TAKE 1 TABLET(50 MG) BY MOUTH DAILY   nystatin (MYCOSTATIN/NYSTOP) powder Apply 1 Application topically 3 (three) times daily.   omeprazole (PRILOSEC) 20 MG capsule Take 1 capsule (20 mg total) by mouth daily before breakfast.   pantoprazole (PROTONIX) 40 MG tablet Take 1 tablet (40 mg total) by mouth 2 (two) times daily before a meal.   potassium chloride (KLOR-CON M) 10 MEQ tablet Take 1 tablet (10 mEq total) by mouth daily. To take whenever you take lasix   promethazine (PHENERGAN) 25 MG tablet Take 1 tablet (25 mg total) by mouth as needed for nausea or vomiting.   Ubrogepant (UBRELVY) 100 MG TABS Take by mouth daily as needed.   [DISCONTINUED] amLODipine (NORVASC) 5 MG tablet Take 1 tablet (5 mg total) by mouth daily.   [DISCONTINUED] cetirizine (ZYRTEC) 10 MG tablet Take 1 tablet (10 mg  total) by mouth daily.   amLODipine (NORVASC) 5 MG tablet Take 1 tablet (5 mg total) by mouth daily.   cetirizine (ZYRTEC) 10 MG tablet Take 1 tablet (10 mg total) by mouth daily.   No facility-administered encounter medications on file as of 06/30/2022.    Surgical History: Past Surgical History:  Procedure Laterality Date   BUBBLE STUDY  12/03/2019   Procedure: BUBBLE STUDY;  Surgeon: Sande Rives, MD;  Location: [facility] ENDOSCOPY;  Service: Cardiovascular;;   BUBBLE STUDY  03/17/2022   Procedure: BUBBLE STUDY;   Surgeon: Thurmon Fair, MD;  Location: [facility] ENDOSCOPY;  Service: Cardiovascular;;   CARDIAC CATHETERIZATION     CESAREAN SECTION  1994   Dr. Arvil Chaco   COLONOSCOPY WITH PROPOFOL N/A 06/26/2014   Procedure: COLONOSCOPY WITH PROPOFOL;  Surgeon: Elnita Maxwell, MD;  Location[facility] ENDOSCOPY;  Service: Endoscopy;  Laterality: N/A;   DILATION AND CURETTAGE OF UTERUS  x2 for incomplete SABs   TEE WITHOUT CARDIOVERSION N/A 12/03/2019   Procedure: TRANSESOPHAGEAL ECHOCARDIOGRAM (TEE);  Surgeon: Sande Rives, MD;  Location: [facility] ENDOSCOPY;  Service: Cardiovascular;  Laterality: N/A;   TEE WITHOUT CARDIOVERSION N/A 03/17/2022   Procedure: TRANSESOPHAGEAL ECHOCARDIOGRAM (TEE);  Surgeon: Thurmon Fair, MD;  Location: [facility] ENDOSCOPY;  Service: Cardiovascular;  Laterality: N/A;    Medical History: Past Medical History:  Diagnosis Date   Acid reflux    Allergic rhinitis 06/26/2015   Allergy    Asthma    Carpal tunnel syndrome    right hand   Chronic kidney disease    Colitis    CVA (cerebral infarction)    2009, 2015   Gout    History of syncope    Hypertension 2008   Low HDL (under 40) 06/26/2015   MI (myocardial infarction) (HCC)    2015 - patient wasnt told by cardiologist that she had MI but has had cardiac cath in past   Migraines    Poor circulation    Renal insufficiency    Stroke (HCC)    Syncope and collapse     Family History: Family History  Problem Relation Age of Onset   Hyperlipidemia Mother    Heart disease Mother        has a defibrillator   COPD Mother    Hypertension Father    Heart disease Father    Hyperlipidemia Father    Hypertension Sister        died in her 30s from heart failure and end stage kidney disease   Kidney disease Sister    Hypertension Brother    Heart disease Brother        has a defibrillator   Coronary artery disease Brother        had CABG   Hyperlipidemia Brother    Healthy Child    Diabetes Child     Social History    Socioeconomic History   Marital status: Married    Spouse name: Not on file   Number of children: 2   Years of education: 11th grade   Highest education level: 11th grade  Occupational History   Occupation: unemployed  Tobacco Use   Smoking status: Every Day    Packs/day: 0.50    Years: 33.00    Additional pack years: 0.00    Total pack years: 16.50    Types: Cigarettes    Start date: 03/04/2017   Smokeless tobacco: Never  Vaping Use   Vaping Use: Never used  Substance and Sexual Activity  Alcohol use: Yes    Comment: rare- drinks an occassional wine cooler   Drug use: No   Sexual activity: Yes    Partners: Male    Birth control/protection: None  Other Topics Concern   Not on file  Social History Narrative   Husband is a Naval architect for [employer] and is the sole provider for the family including 2 children (45 yr old and 1 yr old)    Goes to food bank for food. Says "really worried" about water and light bill. Husband, baby, 78 year old, her, and mother all live together.    Patient would like appointment with [facility].       Pt is right handed   Social Determinants of Health   Financial Resource Strain: Low Risk  (05/20/2022)   Overall Financial Resource Strain (CARDIA)    Difficulty of Paying Living Expenses: Not very hard  Food Insecurity: Food Insecurity Present (04/23/2022)   Hunger Vital Sign    Worried About Running Out of Food in the Last Year: Often true    Ran Out of Food in the Last Year: Often true  Transportation Needs: Unmet Transportation Needs (04/27/2022)   PRAPARE - Administrator, Civil Service (Medical): Yes    Lack of Transportation (Non-Medical): Yes  Physical Activity: Insufficiently Active (05/20/2022)   Exercise Vital Sign    Days of Exercise per Week: 2 days    Minutes of Exercise per Session: 60 min  Stress: No Stress Concern Present (04/20/2022)   [employer] of Occupational Health - Occupational Stress Questionnaire     Feeling of Stress : Only a little  Social Connections: Moderately Isolated (06/24/2022)   Social Connection and Isolation Panel [NHANES]    Frequency of Communication with Friends and Family: More than three times a week    Frequency of Social Gatherings with Friends and Family: More than three times a week    Attends Religious Services: Never    Database administrator or Organizations: No    Attends Banker Meetings: Never    Marital Status: Married  Catering manager Violence: Not At Risk (04/23/2022)   Humiliation, Afraid, Rape, and Kick questionnaire    Fear of Current or Ex-Partner: No    Emotionally Abused: No    Physically Abused: No    Sexually Abused: No      Review of Systems  Constitutional:  Positive for activity change and fatigue. Negative for chills and unexpected weight change.  HENT:  Positive for postnasal drip. Negative for congestion, rhinorrhea, sneezing and sore throat.   Eyes:  Negative for redness.  Respiratory:  Positive for cough and shortness of breath. Negative for chest tightness and wheezing.   Cardiovascular:  Positive for chest pain (intermittent related to heart disease) and palpitations (sometimes).  Gastrointestinal: Negative.  Negative for abdominal pain, constipation, diarrhea, nausea and vomiting.       Is on colitis diet, can have symptoms if having a flare, includes abdominal pain, diarrhea and abdominal distention, cramping and bloating.   Genitourinary: Negative.  Negative for dysuria and frequency.  Musculoskeletal: Negative.  Negative for arthralgias, back pain, joint swelling and neck pain.  Skin:  Negative for rash.  Neurological:  Positive for headaches. Negative for tremors and numbness.  Hematological:  Negative for adenopathy. Bruises/bleeds easily.  Psychiatric/Behavioral: Negative.  Negative for behavioral problems (Depression), sleep disturbance and suicidal ideas. The patient is not nervous/anxious.     Vital  Signs: BP 130/88  Pulse 60   Temp 98.5 F (36.9 C)   Resp 16   Ht 5\' 1"  (1.549 m)   Wt 165 lb 3.2 oz (74.9 kg)   LMP 08/18/2020 (Approximate) Comment: Tubal ligation  SpO2 99%   BMI 31.21 kg/m    Physical Exam Vitals reviewed.  Constitutional:      General: She is not in acute distress.    Appearance: Normal appearance. She is obese. She is not ill-appearing.  HENT:     Head: Normocephalic and atraumatic.     Right Ear: Tympanic membrane, ear canal and external ear normal.     Left Ear: Tympanic membrane, ear canal and external ear normal.     Nose: Nose normal. No congestion or rhinorrhea.     Mouth/Throat:     Mouth: Mucous membranes are moist.     Pharynx: Oropharynx is clear. No oropharyngeal exudate or posterior oropharyngeal erythema.  Eyes:     Extraocular Movements: Extraocular movements intact.     Conjunctiva/sclera: Conjunctivae normal.     Pupils: Pupils are equal, round, and reactive to light.  Cardiovascular:     Rate and Rhythm: Normal rate and regular rhythm.     Pulses: Normal pulses.     Heart sounds: Murmur heard.  Pulmonary:     Effort: Pulmonary effort is normal. No respiratory distress.     Breath sounds: Normal breath sounds.  Abdominal:     General: Bowel sounds are normal. There is no distension.     Palpations: Abdomen is soft. There is no mass.     Tenderness: There is no abdominal tenderness.     Hernia: No hernia is present.  Musculoskeletal:        General: Normal range of motion.     Cervical back: Normal range of motion.     Right lower leg: No edema.     Left lower leg: No edema.  Lymphadenopathy:     Cervical: No cervical adenopathy.  Skin:    General: Skin is warm and dry.     Capillary Refill: Capillary refill takes less than 2 seconds.  Neurological:     Mental Status: She is alert and oriented to person, place, and time.     Cranial Nerves: No cranial nerve deficit.     Coordination: Coordination normal.     Gait: Gait  normal.  Psychiatric:        Mood and Affect: Mood normal.        Behavior: Behavior normal.        Thought Content: Thought content normal.        Judgment: Judgment normal.        Assessment/Plan: 1. Encounter for routine adult health examination with abnormal findings Age-appropriate preventive screenings and vaccinations discussed, annual physical exam completed. Routine labs for health maintenance deferred since she has had many labs done recently via cardiology and preop screening. PHM updated.   2. Essential hypertension Continue amlodipine and other medications as prescribed.  - amLODipine (NORVASC) 5 MG tablet; Take 1 tablet (5 mg total) by mouth daily.  Dispense: 90 tablet; Refill: 1  3. Acute non-recurrent frontal sinusitis Empiric doxycycline prescribed.  - doxycycline (VIBRA-TABS) 100 MG tablet; Take 1 tablet (100 mg total) by mouth 2 (two) times daily for 10 days. Take with food.  Dispense: 20 tablet; Refill: 0  4. Gout, unspecified cause, unspecified chronicity, unspecified site Repeat uric acid level then will adjust allopurinol dose - Uric acid  5. Valvular heart disease  Waiting to have her heart surgery for valve  replacement x2, her cardiologist wants to do a heart cath first   6. Abnormality of gait following cerebrovascular accident After effect of her strokes. Physical therapy was previously ordered for her   7. Non-seasonal allergic rhinitis due to other allergic trigger Continue cetirizine as prescribed - cetirizine (ZYRTEC) 10 MG tablet; Take 1 tablet (10 mg total) by mouth daily.  Dispense: 90 tablet; Refill: 1      General Counseling: Samariyah verbalizes understanding of the findings of todays visit and agrees with plan of treatment. I have discussed any further diagnostic evaluation that may be needed or ordered today. We also reviewed her medications today. she has been encouraged to call the office with any questions or concerns that should arise  related to todays visit.    Orders Placed This Encounter  Procedures   Uric acid    Meds ordered this encounter  Medications   cetirizine (ZYRTEC) 10 MG tablet    Sig: Take 1 tablet (10 mg total) by mouth daily.    Dispense:  90 tablet    Refill:  1   amLODipine (NORVASC) 5 MG tablet    Sig: Take 1 tablet (5 mg total) by mouth daily.    Dispense:  90 tablet    Refill:  1   doxycycline (VIBRA-TABS) 100 MG tablet    Sig: Take 1 tablet (100 mg total) by mouth 2 (two) times daily for 10 days. Take with food.    Dispense:  20 tablet    Refill:  0    Return in about 3 months (around 09/30/2022) for F/U, Koven Belinsky PCP.   Total time spent:30 Minutes Time spent includes review of chart, medications, test results, and follow up plan with the patient.   [place] Controlled Substance Database was reviewed by me.  This patient was seen by Sallyanne Kuster, FNP-C in collaboration with Dr. Beverely Risen as a part of collaborative care agreement.  Sergio Zawislak R. Tedd Sias, MSN, FNP-C Internal medicine

## 2022-07-01 ENCOUNTER — Ambulatory Visit: Payer: Medicaid Other

## 2022-07-02 ENCOUNTER — Encounter: Payer: Self-pay | Admitting: Nurse Practitioner

## 2022-07-06 ENCOUNTER — Ambulatory Visit: Payer: Medicaid Other

## 2022-07-09 ENCOUNTER — Encounter: Payer: Self-pay | Admitting: Nurse Practitioner

## 2022-07-14 ENCOUNTER — Ambulatory Visit: Payer: Medicaid Other

## 2022-07-15 ENCOUNTER — Other Ambulatory Visit: Payer: Self-pay | Admitting: Nurse Practitioner

## 2022-07-15 DIAGNOSIS — Z79899 Other long term (current) drug therapy: Secondary | ICD-10-CM

## 2022-07-21 ENCOUNTER — Ambulatory Visit: Payer: Medicaid Other

## 2022-07-28 ENCOUNTER — Ambulatory Visit: Payer: Medicaid Other | Admitting: Neurology

## 2022-07-28 ENCOUNTER — Encounter: Payer: Self-pay | Admitting: Neurology

## 2022-07-28 VITALS — BP 113/64 | HR 55 | Ht 61.0 in | Wt 162.0 lb

## 2022-07-28 DIAGNOSIS — I639 Cerebral infarction, unspecified: Secondary | ICD-10-CM

## 2022-07-28 DIAGNOSIS — Z8673 Personal history of transient ischemic attack (TIA), and cerebral infarction without residual deficits: Secondary | ICD-10-CM

## 2022-07-28 DIAGNOSIS — H53461 Homonymous bilateral field defects, right side: Secondary | ICD-10-CM

## 2022-07-28 NOTE — Patient Instructions (Addendum)
I had a long d/w patient and her friend about her recent  crptogenic stroke, prior strokes,risk for recurrent stroke/TIAs, personally independently reviewed imaging studies and stroke evaluation results and answered questions.Continue aspirin 81 mg daily  for secondary stroke prevention and discontinue Plavix now and maintain strict control of hypertension with blood pressure goal below 130/90, diabetes with hemoglobin A1c goal below 6.5% and lipids with LDL cholesterol goal below 70 mg/dL. I also advised the patient to eat a healthy diet with plenty of whole grains, cereals, fruits and vegetables, exercise regularly and maintain ideal body weight.  She was advised to follow-up with her cardiologist for cardiac catheterization and referral to cardiothoracic surgery for valve replacement.  She was advised not to drive due to peripheral vision loss.  Continue follow-up with her neurologist Dr. Everlena Cooper and return for follow-up to see me only if necessary.  Stroke Prevention Some medical conditions and behaviors can lead to a higher chance of having a stroke. You can help prevent a stroke by eating healthy, exercising, not smoking, and managing any medical conditions you have. Stroke is a leading cause of functional impairment. Primary prevention is particularly important because a majority of strokes are first-time events. Stroke changes the lives of not only those who experience a stroke but also their family and other caregivers. How can this condition affect me? A stroke is a medical emergency and should be treated right away. A stroke can lead to brain damage and can sometimes be life-threatening. If a person gets medical treatment right away, there is a better chance of surviving and recovering from a stroke. What can increase my risk? The following medical conditions may increase your risk of a stroke: Cardiovascular disease. High blood pressure (hypertension). Diabetes. High cholesterol. Sickle cell  disease. Blood clotting disorders (hypercoagulable state). Obesity. Sleep disorders (obstructive sleep apnea). Other risk factors include: Being older than age 25. Having a history of blood clots, stroke, or mini-stroke (transient ischemic attack, TIA). Genetic factors, such as race, ethnicity, or a family history of stroke. Smoking cigarettes or using other tobacco products. Taking birth control pills, especially if you also use tobacco. Heavy use of alcohol or drugs, especially cocaine and methamphetamine. Physical inactivity. What actions can I take to prevent this? Manage your health conditions High cholesterol levels. Eating a healthy diet is important for preventing high cholesterol. If cholesterol cannot be managed through diet alone, you may need to take medicines. Take any prescribed medicines to control your cholesterol as told by your health care provider. Hypertension. To reduce your risk of stroke, try to keep your blood pressure below 130/80. Eating a healthy diet and exercising regularly are important for controlling blood pressure. If these steps are not enough to manage your blood pressure, you may need to take medicines. Take any prescribed medicines to control hypertension as told by your health care provider. Ask your health care provider if you should monitor your blood pressure at home. Have your blood pressure checked every year, even if your blood pressure is normal. Blood pressure increases with age and some medical conditions. Diabetes. Eating a healthy diet and exercising regularly are important parts of managing your blood sugar (glucose). If your blood sugar cannot be managed through diet and exercise, you may need to take medicines. Take any prescribed medicines to control your diabetes as told by your health care provider. Get evaluated for obstructive sleep apnea. Talk to your health care provider about getting a sleep evaluation if you snore a lot  or have  excessive sleepiness. Make sure that any other medical conditions you have, such as atrial fibrillation or atherosclerosis, are managed. Nutrition Follow instructions from your health care provider about what to eat or drink to help manage your health condition. These instructions may include: Reducing your daily calorie intake. Limiting how much salt (sodium) you use to 1,500 milligrams (mg) each day. Using only healthy fats for cooking, such as olive oil, canola oil, or sunflower oil. Eating healthy foods. You can do this by: Choosing foods that are high in fiber, such as whole grains, and fresh fruits and vegetables. Eating at least 5 servings of fruits and vegetables a day. Try to fill one-half of your plate with fruits and vegetables at each meal. Choosing lean protein foods, such as lean cuts of meat, poultry without skin, fish, tofu, beans, and nuts. Eating low-fat dairy products. Avoiding foods that are high in sodium. This can help lower blood pressure. Avoiding foods that have saturated fat, trans fat, and cholesterol. This can help prevent high cholesterol. Avoiding processed and prepared foods. Counting your daily carbohydrate intake.  Lifestyle If you drink alcohol: Limit how much you have to: 0-1 drink a day for women who are not pregnant. 0-2 drinks a day for men. Know how much alcohol is in your drink. In the [place] one drink equals one 12 oz bottle of beer ( ), one 5 oz glass of wine ( ), or one 1 oz glass of hard liquor (44mL). Do not use any products that contain nicotine or tobacco. These products include cigarettes, chewing tobacco, and vaping devices, such as e-cigarettes. If you need help quitting, ask your health care provider. Avoid secondhand smoke. Do not use drugs. Activity  Try to stay at a healthy weight. Get at least 30 minutes of exercise on most days, such as: Fast walking. Biking. Swimming. Medicines Take over-the-counter and prescription  medicines only as told by your health care provider. Aspirin or blood thinners (antiplatelets or anticoagulants) may be recommended to reduce your risk of forming blood clots that can lead to stroke. Avoid taking birth control pills. Talk to your health care provider about the risks of taking birth control pills if: You are over 66 years old. You smoke. You get very bad headaches. You have had a blood clot. Where to find more information American Stroke Association: www.strokeassociation.org Get help right away if: You or a loved one has any symptoms of a stroke. "BE FAST" is an easy way to remember the main warning signs of a stroke: B - Balance. Signs are dizziness, sudden trouble walking, or loss of balance. E - Eyes. Signs are trouble seeing or a sudden change in vision. F - Face. Signs are sudden weakness or numbness of the face, or the face or eyelid drooping on one side. A - Arms. Signs are weakness or numbness in an arm. This happens suddenly and usually on one side of the body. S - Speech. Signs are sudden trouble speaking, slurred speech, or trouble understanding what people say. T - Time. Time to call emergency services. Write down what time symptoms started. You or a loved one has other signs of a stroke, such as: A sudden, severe headache with no known cause. Nausea or vomiting. Seizure. These symptoms may represent a serious problem that is an emergency. Do not wait to see if the symptoms will go away. Get medical help right away. Call your local emergency services (911 in the [place] Do not drive yourself to  the hospital. Summary You can help to prevent a stroke by eating healthy, exercising, not smoking, limiting alcohol intake, and managing any medical conditions you may have. Do not use any products that contain nicotine or tobacco. These include cigarettes, chewing tobacco, and vaping devices, such as e-cigarettes. If you need help quitting, ask your health care  provider. Remember "BE FAST" for warning signs of a stroke. Get help right away if you or a loved one has any of these signs. This information is not intended to replace advice given to you by your health care provider. Make sure you discuss any questions you have with your health care provider. Document Revised: 11/23/2021 Document Reviewed: 11/23/2021 Elsevier Patient Education  2024 [employer].

## 2022-07-28 NOTE — Progress Notes (Signed)
[facility] [address] [place]. [place] [address] [phone]       OFFICE FOLLOW-UP NOTE  Gabrielle Schultz Date of Birth:  Dec 26, 1970 Medical Record Number:  [id number]   HPI: Ms. Henken is a 52 year old Caucasian lady seen today for initial office follow-up visit following hospital admission for stroke in April 2024.  She is accompanied by a lady friend.  History is obtained from her and review of electronic medical records and I personally reviewed pertinent available imaging films in PACS.  She has past medical history of multiple prior strokes, coronary artery disease, hypertension, renal insufficiency who presented on 04/22/2022 with sudden onset of left-sided weakness.  She started improving after arrival but had persistent facial asymmetry and slurred speech.  Noncontrast CT scan showed no acute abnormality.  She met criteria for and was given IV TNK with good improvement.  MRI scan showed a patchy right MCA branch infarct.  CT angiogram showed no large vessel intra or extracranial stenosis.  TEE on 03/17/2022 had shown normal ejection fraction of 60 to 65% with moderate left atrial dilatation but no thrombus.  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  LDL cholesterol 69 mg percent.  Hemoglobin A1c was 5.6.  Patient was advised to take aspirin and Brilinta for 4 weeks followed by aspirin alone but she was unable to afford the Brilinta and states she has been taking aspirin and Plavix which is tolerating well with only minor bruising and no bleeding.  She has had no recurrent stroke or TIA symptoms.  She states left-sided weakness is improved though she still has diminished fine motor skills in the hand.  She does drag her leg a little bit while walking.  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  She has seen her cardiologist and has been referred for  cardiac cath which she plans to have soon.  They will consider referral to cardiothoracic surgery for valve replacement following cardiac catheter results.  Patient has applied for disability which was denied and she has reapplied.  She was seen by her neurologist Dr. Everlena Cooper on 05/19/2022 and outpatient cardiology on 05/04/2022 Past History of stroke History of remote left PCA infarct 11/2019 admitted for right frontal cortical infarct status post tPA.  CT showed chronic left PCA and MCA infarct.  CTA head and neck unremarkable.  EF 65 to 70%.  No DVT.  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  LDL 97, A1c 5.7, UDS negative.  Hypercoag workup negative except homocystine level 52.  Discharged on DAPT and Zocor 40.  Asked her to follow-up with Dr. Bufford Buttner cardiology as outpatient but she has no insurance. 07/2020 admitted for transient right-sided weakness and numbness, proximal TIA 08/2021 admitted for transient bilateral leg weakness, MRI showed possible subacute parietal infarct with evidence of cortical laminar necrosis.  MRA head and neck negative.  30-day CardioNet monitoring negative. 01/2022 admitted for right facial droop, slurred speech and right hemianopia.  MRI negative, CT negative. 02/2022 follow-up with cardiology Dr. Lynnette Caffey as outpatient.  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  ROS:   14 system review of systems is positive for vision loss, numbness, weakness, diminished fine motor skills all other systems negative  PMH:  Past Medical History:  Diagnosis Date   Acid reflux    Allergic rhinitis 06/26/2015  Allergy    Asthma    Carpal tunnel syndrome    right hand   Chronic kidney disease    Colitis    CVA (cerebral infarction)    2009, 2015   Gout    History of syncope    Hypertension 2008   Low HDL (under 40) 06/26/2015   MI (myocardial  infarction) (HCC)    2015 - patient wasnt told by cardiologist that she had MI but has had cardiac cath in past   Migraines    Poor circulation    Renal insufficiency    Stroke [facility])    Syncope and collapse     Social History:  Social History   Socioeconomic History   Marital status: Married    Spouse name: Not on file   Number of children: 2   Years of education: 11th grade   Highest education level: 11th grade  Occupational History   Occupation: unemployed  Tobacco Use   Smoking status: Every Day    Current packs/day: 0.50    Average packs/day: 0.5 packs/day for 33.0 years (16.5 ttl pk-yrs)    Types: Cigarettes    Start date: 03/04/2017   Smokeless tobacco: Never  Vaping Use   Vaping status: Never Used  Substance and Sexual Activity   Alcohol use: Yes    Comment: rare- drinks an occassional wine cooler   Drug use: No   Sexual activity: Yes    Partners: Male    Birth control/protection: None  Other Topics Concern   Not on file  Social History Narrative   Husband is a Naval architect for [employer] and is the sole provider for the family including 2 children (76 yr old and 1 yr old)    Goes to food bank for food. Says "really worried" about water and light bill. Husband, baby, 21 year old, her, and mother all live together.    Patient would like appointment with [facility].       Pt is right handed   Social Determinants of Health   Financial Resource Strain: Low Risk  (05/20/2022)   Overall Financial Resource Strain (CARDIA)    Difficulty of Paying Living Expenses: Not very hard  Food Insecurity: Food Insecurity Present (04/23/2022)   Hunger Vital Sign    Worried About Running Out of Food in the Last Year: Often true    Ran Out of Food in the Last Year: Often true  Transportation Needs: Unmet Transportation Needs (04/27/2022)   PRAPARE - Administrator, Civil Service (Medical): Yes    Lack of Transportation (Non-Medical): Yes  Physical Activity:  Insufficiently Active (05/20/2022)   Exercise Vital Sign    Days of Exercise per Week: 2 days    Minutes of Exercise per Session: 60 min  Stress: No Stress Concern Present (04/20/2022)   [employer] of Occupational Health - Occupational Stress Questionnaire    Feeling of Stress : Only a little  Social Connections: Moderately Isolated (06/24/2022)   Social Connection and Isolation Panel [NHANES]    Frequency of Communication with Friends and Family: More than three times a week    Frequency of Social Gatherings with Friends and Family: More than three times a week    Attends Religious Services: Never    Database administrator or Organizations: No    Attends Banker Meetings: Never    Marital Status: Married  Catering manager Violence: Not At Risk (04/23/2022)   Humiliation, Afraid, Rape, and Kick questionnaire  Fear of Current or Ex-Partner: No    Emotionally Abused: No    Physically Abused: No    Sexually Abused: No    Medications:   Current Outpatient Medications on File Prior to Visit  Medication Sig Dispense Refill   acetaminophen (TYLENOL) 650 MG CR tablet Take 650-1,300 mg by mouth every 8 (eight) hours as needed for pain.     albuterol (PROVENTIL HFA) 108 (90 Base) MCG/ACT inhaler Inhale 2 puffs into the lungs once every 4 (four) hours as needed for wheezing (cough). 18 g 5   allopurinol (ZYLOPRIM) 100 MG tablet TAKE ONE TABLET BY MOUTH ONCE DAILY. (Patient taking differently: Take 100 mg by mouth daily.) 90 tablet 3   amLODipine (NORVASC) 5 MG tablet Take 1 tablet (5 mg total) by mouth daily. 90 tablet 1   aspirin 81 MG chewable tablet Chew 1 tablet (81 mg total) by mouth daily. 90 tablet 0   atorvastatin (LIPITOR) 80 MG tablet Take 1 tablet (80 mg total) by mouth at bedtime. 90 tablet 1   cetirizine (ZYRTEC) 10 MG tablet Take 1 tablet (10 mg total) by mouth daily. 90 tablet 1   cloNIDine (CATAPRES) 0.3 MG tablet TAKE 1 TABLET BY MOUTH THREE TIMES  DAILY(MORNING, NOON AND EVENING) 270 tablet 0   clopidogrel (PLAVIX) 75 MG tablet Take 1 tablet (75 mg total) by mouth daily. 90 tablet 1   docusate sodium (COLACE) 100 MG capsule Take 100 mg by mouth in the morning.     fluticasone (FLONASE) 50 MCG/ACT nasal spray USE 1 SPRAY INTO BOTH NOSTRILS 2 TIMES A DAY (Patient taking differently: Place 1 spray into both nostrils at bedtime. USE 1 SPRAY INTO BOTH NOSTRILS 2 TIMES A DAY) 16 g 0   fluticasone-salmeterol (WIXELA INHUB) 250-50 MCG/ACT AEPB Inhale one puff into the lungs in the morning and at bedtime 60 each 11   furosemide (LASIX) 20 MG tablet Take 1 tablet (20 mg total) by mouth every other day. 16 tablet 3   losartan (COZAAR) 50 MG tablet TAKE 1 TABLET(50 MG) BY MOUTH DAILY (Patient taking differently: Take 25 mg by mouth daily.) 90 tablet 0   nystatin (MYCOSTATIN/NYSTOP) powder Apply 1 Application topically 3 (three) times daily. 30 g 5   omeprazole (PRILOSEC) 20 MG capsule Take 1 capsule (20 mg total) by mouth daily before breakfast. 90 capsule 1   pantoprazole (PROTONIX) 40 MG tablet Take 1 tablet (40 mg total) by mouth 2 (two) times daily before a meal. 180 tablet 2   potassium chloride (KLOR-CON M) 10 MEQ tablet Take 1 tablet (10 mEq total) by mouth daily. To take whenever you take lasix 90 tablet 1   promethazine (PHENERGAN) 25 MG tablet Take 1 tablet (25 mg total) by mouth as needed for nausea or vomiting. 30 tablet 2   Ubrogepant (UBRELVY) 100 MG TABS Take by mouth daily as needed.     No current facility-administered medications on file prior to visit.    Allergies:   Allergies  Allergen Reactions   Coconut (Cocos Nucifera) Hives and Swelling    Just coconut ("all coconut")   Amoxicillin Other (See Comments)    "makes me sick" "swelled up everywhere" Has patient had a PCN reaction causing immediate rash, facial/tongue/throat swelling, SOB or lightheadedness with hypotension: No Has patient had a PCN reaction causing severe rash  involving mucus membranes or skin necrosis: No Has patient had a PCN reaction that required hospitalization: No Has patient had a PCN reaction occurring within  the last 10 years: No If all of the above answers are "NO", then may proceed with Cephalosporin use.     Penicillins Other (See Comments)    "makes me sick" "swelled up oil" Has patient had a PCN reaction causing immediate rash, facial/tongue/throat swelling, SOB or lightheadedness with hypotension: No Has patient had a PCN reaction causing severe rash involving mucus membranes or skin necrosis: No Has patient had a PCN reaction that required hospitalization: No Has patient had a PCN reaction occurring within the last 10 years: Yes If all of the above answers are "NO", then may proceed with Cephalosporin use.    Singulair [Montelukast Sodium] Rash    "made heart race"    Physical Exam General: Mildly obese middle-aged Caucasian lady seated, in no evident distress Head: head normocephalic and atraumatic.  Neck: supple with no carotid or supraclavicular bruits Cardiovascular: regular rate and rhythm, no murmurs Musculoskeletal: no deformity Skin:  no rash/petichiae Vascular:  Normal pulses all extremities Vitals:   07/28/22 0935  BP: 113/64  Pulse: (!) 55   Neurologic Exam Mental Status: Awake and fully alert. Oriented to place and time. Recent and remote memory intact. Attention span, concentration and fund of knowledge appropriate. Mood and affect appropriate.  Cranial Nerves: Fundoscopic exam reveals sharp disc margins. Pupils equal, briskly reactive to light. Extraocular movements full without nystagmus. Visual fields show dense right homonymous hemianopsia l to confrontation. Hearing intact. Facial sensation intact. Face, tongue, palate moves normally and symmetrically.  Motor: Normal bulk and tone. Normal strength in all tested extremity muscles.  Mild weakness of left grip.  And diminished fine finger movements on the  left.  Orbits right over left upper extremity. Sensory.: intact to touch ,pinprick .position and vibratory sensation.  Coordination: Rapid alternating movements normal in all extremities. Finger-to-nose and heel-to-shin performed accurately bilaterally. Gait and Station: Arises from chair without difficulty. Stance is normal. Gait demonstrates normal stride length and balance . Able to heel, toe and tandem walk with difficulty.  Reflexes: 1+ and symmetric. Toes downgoing.   NIHSS  2 Modified Rankin  2   ASSESSMENT: 52 year old Caucasian lady with right MCA branch infarct of cryptogenic etiology treated with TNK in April 2024.  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  Prior history of remote left PCA infarct in November 2021 with residual right hemianopsia.  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 I also advised the patient to eat a healthy diet with plenty of whole grains, cereals, fruits and vegetables, exercise regularly and maintain ideal body weight.  She was advised to follow-up with her cardiologist for cardiac catheterization and referral to cardiothoracic surgery for valve replacement.  She was advised not to drive due to peripheral vision loss.  Continue follow-up with her neurologist Dr. Everlena Cooper and return for follow-up to see me only if necessary. Greater than 50% of  time during this 40 minute visit was spent on counseling,explanation of diagnosis, planning  of further management, discussion with patient and family and coordination of care Delia Heady, MD Note: This document was prepared with digital dictation and possible smart phrase technology. Any transcriptional errors that result from this process are unintentional

## 2022-07-29 ENCOUNTER — Ambulatory Visit (INDEPENDENT_AMBULATORY_CARE_PROVIDER_SITE_OTHER): Payer: Medicaid Other

## 2022-07-29 DIAGNOSIS — I639 Cerebral infarction, unspecified: Secondary | ICD-10-CM

## 2022-07-29 LAB — CUP PACEART REMOTE DEVICE CHECK
Date Time Interrogation Session: 20240725031231
Implantable Pulse Generator Implant Date: 20240619

## 2022-08-02 NOTE — Progress Notes (Unsigned)
Cardiology Office Note  Date:  08/03/2022   ID:  Gabrielle Schultz, Gabrielle Schultz September 17, 1970, MRN [id number]  PCP:  Gabrielle Kuster, NP   Chief Complaint  Patient presents with   3 month follow up     Patient c/o shortness of breath with little to no exertion & has a cough. Medications reviewed by the patient verbally.     HPI:  [name] with past medical history of Hypertension Recurrent strokes, August 2023, October 2023 Chronic kidney disease hospital July 2013 for headache, nausea, right side facial tingling and numbness, found to have severe hypertension.   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, mild to moderate MR Who presents for follow-up of her aortic valve disorder  Last seen by myself in clinic October 2023 Seen by several partners through 2024 Transesophageal echo performed, seen by EP Recommendation made for aortic valve replacement given stenosis and regurgitation Valve is calcified, or stenosis, moderate to severe regurgitation chronic renal insufficiency creatinine greater than 2 (repeat pending with lower dose Lasix)  Reports that she is scheduled to see her dentist sometime in August, she is uncertain of the specifics  Hx of migraines, followed by neurology Loop monitor in place, no atrial fibrillation documented  EKG personally reviewed by myself on todays visit EKG Interpretation Date/Time:  Tuesday August 03 2022 14:51:07 EDT Ventricular Rate:  76 PR Interval:  160 QRS Duration:  116 QT Interval:  402 QTC Calculation: 452 R Axis:   -14  Text Interpretation: Normal sinus rhythm Left ventricular hypertrophy with QRS widening and repolarization abnormality ( R in aVL , Cornell product , Romhilt-Estes ) When compared with ECG of 09-Jan-2022 17:59, No significant change was found Confirmed by Julien Nordmann [id number]) on 08/03/2022 3:19:08 PM    PMH:    has a past medical history of Acid reflux, Allergic rhinitis (06/26/2015), Allergy, Asthma, Carpal tunnel syndrome, Chronic kidney disease, Colitis, CVA (cerebral infarction), Gout, History of syncope, Hypertension (2008), Low HDL (under 40) (06/26/2015), MI (myocardial infarction) (HCC), Migraines, Poor circulation, Renal insufficiency, Stroke (HCC), and Syncope and collapse.  PSH:    Past Surgical History:  Procedure Laterality Date   BUBBLE STUDY  12/03/2019   Procedure: BUBBLE STUDY;  Surgeon: Sande Rives, MD;  Location: [facility] ENDOSCOPY;  Service: Cardiovascular;;   BUBBLE STUDY  03/17/2022   Procedure: BUBBLE STUDY;  Surgeon: Thurmon Fair, MD;  Location: [facility] ENDOSCOPY;  Service: Cardiovascular;;   CARDIAC CATHETERIZATION     CESAREAN SECTION  1994   Dr. Arvil Chaco   COLONOSCOPY WITH PROPOFOL N/A 06/26/2014   Procedure: COLONOSCOPY WITH PROPOFOL;  Surgeon: Elnita Maxwell, MD;  Location: [facility] ENDOSCOPY;  Service: Endoscopy;  Laterality: N/A;   DILATION AND CURETTAGE OF UTERUS  x2 for incomplete SABs   TEE WITHOUT CARDIOVERSION N/A 12/03/2019   Procedure: TRANSESOPHAGEAL ECHOCARDIOGRAM (TEE);  Surgeon: Sande Rives, MD;  Location: [facility] ENDOSCOPY;  Service: Cardiovascular;  Laterality: N/A;   TEE WITHOUT CARDIOVERSION N/A 03/17/2022   Procedure: TRANSESOPHAGEAL ECHOCARDIOGRAM (TEE);  Surgeon: Thurmon Fair, MD;  Location: [facility] ENDOSCOPY;  Service: Cardiovascular;  Laterality: N/A;    Current Outpatient Medications  Medication Sig Dispense Refill   acetaminophen (TYLENOL) 650 MG CR tablet Take 650-1,300 mg by mouth every 8 (eight) hours as needed for pain.     albuterol (PROVENTIL HFA) 108 (90 Base) MCG/ACT inhaler Inhale 2 puffs into the lungs once every  4 (four) hours as needed for wheezing (cough). 18 g 5   allopurinol (ZYLOPRIM) 100 MG tablet TAKE ONE TABLET BY MOUTH ONCE DAILY. 90 tablet 3   amLODipine (NORVASC) 5 MG tablet Take 1 tablet (5 mg total) by mouth daily. 90  tablet 1   aspirin 81 MG chewable tablet Chew 1 tablet (81 mg total) by mouth daily. 90 tablet 0   atorvastatin (LIPITOR) 80 MG tablet Take 1 tablet (80 mg total) by mouth at bedtime. 90 tablet 1   cetirizine (ZYRTEC) 10 MG tablet Take 1 tablet (10 mg total) by mouth daily. 90 tablet 1   cloNIDine (CATAPRES) 0.3 MG tablet TAKE 1 TABLET BY MOUTH THREE TIMES DAILY(MORNING, NOON AND EVENING) 270 tablet 0   clopidogrel (PLAVIX) 75 MG tablet Take 75 mg by mouth daily.     docusate sodium (COLACE) 100 MG capsule Take 100 mg by mouth in the morning.     fluconazole (DIFLUCAN) 150 MG tablet Take 150 mg by mouth once.     fluticasone (FLONASE) 50 MCG/ACT nasal spray USE 1 SPRAY INTO BOTH NOSTRILS 2 TIMES A DAY (Patient taking differently: Place 1 spray into both nostrils at bedtime. USE 1 SPRAY INTO BOTH NOSTRILS 2 TIMES A DAY) 16 g 0   fluticasone-salmeterol (WIXELA INHUB) 250-50 MCG/ACT AEPB Inhale one puff into the lungs in the morning and at bedtime 60 each 11   furosemide (LASIX) 20 MG tablet Take 1 tablet (20 mg total) by mouth every other day. 16 tablet 3   losartan (COZAAR) 50 MG tablet Take 25 mg by mouth daily.     nystatin (MYCOSTATIN/NYSTOP) powder Apply 1 Application topically 3 (three) times daily. 30 g 5   omeprazole (PRILOSEC) 20 MG capsule Take 1 capsule (20 mg total) by mouth daily before breakfast. 90 capsule 1   pantoprazole (PROTONIX) 40 MG tablet Take 1 tablet (40 mg total) by mouth 2 (two) times daily before a meal. 180 tablet 2   potassium chloride (KLOR-CON M) 10 MEQ tablet Take 1 tablet (10 mEq total) by mouth daily. To take whenever you take lasix 90 tablet 1   promethazine (PHENERGAN) 25 MG tablet Take 1 tablet (25 mg total) by mouth as needed for nausea or vomiting. 30 tablet 2   Ubrogepant (UBRELVY) 100 MG TABS Take by mouth daily as needed.     No current facility-administered medications for this visit.     Allergies:   Coconut (cocos nucifera), Amoxicillin, Penicillins,  and Singulair [montelukast sodium]   Social History:  The patient  reports that she has been smoking cigarettes. She started smoking about 5 years ago. She has a 16.5 pack-year smoking history. She has never used smokeless tobacco. She reports current alcohol use. She reports that she does not use drugs.   Family History:   family history includes COPD in her mother; Coronary artery disease in her brother; Diabetes in her child; Healthy in her child; Heart disease in her brother, father, and mother; Hyperlipidemia in her brother, father, and mother; Hypertension in her brother, father, and sister; Kidney disease in her sister.    Review of Systems: Review of Systems  Constitutional: Negative.   HENT: Negative.    Respiratory: Negative.    Cardiovascular:  Positive for palpitations.  Gastrointestinal: Negative.   Musculoskeletal: Negative.   Neurological: Negative.   Psychiatric/Behavioral: Negative.    All other systems reviewed and are negative.   PHYSICAL EXAM: VS:  BP 126/68 (BP Location: Left Arm, Patient Position:  Sitting, Cuff Size: Normal)   Pulse 76   Ht 5\' 1"  (1.549 m)   Wt 164 lb (74.4 kg)   LMP 08/18/2020 (Approximate) Comment: Tubal ligation  SpO2 98%   BMI 30.99 kg/m  , BMI Body mass index is 30.99 kg/m. Constitutional:  oriented to person, place, and time. No distress.  HENT:  Head: Grossly normal Eyes:  no discharge. No scleral icterus.  Neck: No JVD, no carotid bruits  Cardiovascular: Regular rate and rhythm, 2/6 SEM right sternal border  pulmonary/Chest: Clear to auscultation bilaterally, no wheezes or rails Abdominal: Soft.  no distension.  no tenderness.  Musculoskeletal: Normal range of motion Neurological:  normal muscle tone. Coordination normal. No atrophy Skin: Skin warm and dry Psychiatric: normal affect, pleasant   Recent Labs: 08/19/2021: TSH 1.356 12/03/2021: Magnesium 1.9 04/22/2022: ALT 15 04/24/2022: Hemoglobin 10.4; Platelets  211 05/04/2022: BUN 35; Creatinine, Ser 2.14; Potassium 4.5; Sodium 140    Lipid Panel Lab Results  Component Value Date   CHOL 115 04/23/2022   HDL 26 (L) 04/23/2022   LDLCALC 69 04/23/2022   TRIG 100 04/23/2022      Wt Readings from Last 3 Encounters:  08/03/22 164 lb (74.4 kg)  07/28/22 162 lb (73.5 kg)  06/30/22 165 lb 3.2 oz (74.9 kg)     ASSESSMENT AND PLAN:  Problem List Items Addressed This Visit       Cardiology Problems   Essential hypertension   Relevant Medications   losartan (COZAAR) 50 MG tablet     Other   Chronic kidney disease   Other Visit Diagnoses     Cerebrovascular accident (CVA), unspecified mechanism (HCC)    -  Primary   Relevant Medications   losartan (COZAAR) 50 MG tablet   Other Relevant Orders   EKG 12-Lead (Completed)   Aortic valvular disease       Relevant Medications   losartan (COZAAR) 50 MG tablet   Other Relevant Orders   EKG 12-Lead (Completed)   Nonrheumatic mitral valve regurgitation       Relevant Medications   losartan (COZAAR) 50 MG tablet   Primary hypertension       Relevant Medications   losartan (COZAAR) 50 MG tablet   Other Relevant Orders   EKG 12-Lead (Completed)   Aortic valve stenosis, etiology of cardiac valve disease unspecified       Relevant Medications   losartan (COZAAR) 50 MG tablet   History of stroke       Aortic valve insufficiency, etiology of cardiac valve disease unspecified       Relevant Medications   losartan (COZAAR) 50 MG tablet   Hyperlipidemia LDL goal <70       Relevant Medications   losartan (COZAAR) 50 MG tablet       History of strokes On aspirin Plavix, loop monitor in place, most recent download no atrial fibrillation Followed by neurology, reports that she has diagnosis of migraines and has medications to take as needed  Aortic valve regurgitation/stenosis Seen by structural heart clinic in [place], Appears referral has been placed to CT surgery for further  discussion of her aortic valve disorder including stenosis with regurgitation Has dental appointment by report mid August but she is unaware of today We will set up right and left heart catheterization Following procedure incidental visit, referral to CT surgery On Lasix every other day BMP pending  Hyperlipidemia Cholesterol is at goal on the current lipid regimen. No changes to the medications were made.  Essential hypertension Blood pressure is well controlled on today's visit. No changes made to the medications.  Chronic renal insufficiency Lasix was decreased down to every other day by one of our colleagues in the office Precatheterization labs today May need prehydration for catheterization if creatinine continues to run high    Total encounter time more than 50 minutes  Greater than 50% was spent in counseling and coordination of care with the patient    Signed, Dossie Arbour, M.D., Ph.D. [facility] [place], [place] [phone]

## 2022-08-02 NOTE — H&P (View-Only) (Signed)
Cardiology Office Note  Date:  08/03/2022   ID:  Shawndrea, Irigoyen 06-28-1970, MRN [id number]  PCP:  Sallyanne Kuster, NP   Chief Complaint  Patient presents with   3 month follow up     Patient c/o shortness of breath with little to no exertion & has a cough. Medications reviewed by the patient verbally.     HPI:  Ms. Kapolka is a 52 year old woman with past medical history of Hypertension Recurrent strokes, August 2023, October 2023 Chronic kidney disease hospital July 2013 for headache, nausea, right side facial tingling and numbness, found to have severe hypertension.   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, mild to moderate MR Who presents for follow-up of her aortic valve disorder  Last seen by myself in clinic October 2023 Seen by several partners through 2024 Transesophageal echo performed, seen by EP Recommendation made for aortic valve replacement given stenosis and regurgitation Valve is calcified, or stenosis, moderate to severe regurgitation chronic renal insufficiency creatinine greater than 2 (repeat pending with lower dose Lasix)  Reports that she is scheduled to see her dentist sometime in August, she is uncertain of the specifics  Hx of migraines, followed by neurology Loop monitor in place, no atrial fibrillation documented  EKG personally reviewed by myself on todays visit EKG Interpretation Date/Time:  Tuesday August 03 2022 14:51:07 EDT Ventricular Rate:  76 PR Interval:  160 QRS Duration:  116 QT Interval:  402 QTC Calculation: 452 R Axis:   -14  Text Interpretation: Normal sinus rhythm Left ventricular hypertrophy with QRS widening and repolarization abnormality ( R in aVL , Cornell product , Romhilt-Estes ) When compared with ECG of 09-Jan-2022 17:59, No significant change was found Confirmed by Julien Nordmann [phone]) on 08/03/2022 3:19:08 PM    PMH:    has a past medical history of Acid reflux, Allergic rhinitis (06/26/2015), Allergy, Asthma, Carpal tunnel syndrome, Chronic kidney disease, Colitis, CVA (cerebral infarction), Gout, History of syncope, Hypertension (2008), Low HDL (under 40) (06/26/2015), MI (myocardial infarction) (HCC), Migraines, Poor circulation, Renal insufficiency, Stroke (HCC), and Syncope and collapse.  PSH:    Past Surgical History:  Procedure Laterality Date   BUBBLE STUDY  12/03/2019   Procedure: BUBBLE STUDY;  Surgeon: Sande Rives, MD;  Location: [facility] ENDOSCOPY;  Service: Cardiovascular;;   BUBBLE STUDY  03/17/2022   Procedure: BUBBLE STUDY;  Surgeon: Thurmon Fair, MD;  Location: [facility] ENDOSCOPY;  Service: Cardiovascular;;   CARDIAC CATHETERIZATION     CESAREAN SECTION  1994   Dr. Arvil Chaco   COLONOSCOPY WITH PROPOFOL N/A 06/26/2014   Procedure: COLONOSCOPY WITH PROPOFOL;  Surgeon: Elnita Maxwell, MD;  Location: [facility] ENDOSCOPY;  Service: Endoscopy;  Laterality: N/A;   DILATION AND CURETTAGE OF UTERUS  x2 for incomplete SABs   TEE WITHOUT CARDIOVERSION N/A 12/03/2019   Procedure: TRANSESOPHAGEAL ECHOCARDIOGRAM (TEE);  Surgeon: Sande Rives, MD;  Location: [facility] ENDOSCOPY;  Service: Cardiovascular;  Laterality: N/A;   TEE WITHOUT CARDIOVERSION N/A 03/17/2022   Procedure: TRANSESOPHAGEAL ECHOCARDIOGRAM (TEE);  Surgeon: Thurmon Fair, MD;  Location: [facility] ENDOSCOPY;  Service: Cardiovascular;  Laterality: N/A;    Current Outpatient Medications  Medication Sig Dispense Refill   acetaminophen (TYLENOL) 650 MG CR tablet Take 650-1,300 mg by mouth every 8 (eight) hours as needed for pain.     albuterol (PROVENTIL HFA) 108 (90 Base) MCG/ACT inhaler Inhale 2 puffs into the lungs once every  4 (four) hours as needed for wheezing (cough). 18 g 5   allopurinol (ZYLOPRIM) 100 MG tablet TAKE ONE TABLET BY MOUTH ONCE DAILY. 90 tablet 3   amLODipine (NORVASC) 5 MG tablet Take 1 tablet (5 mg total) by mouth daily. 90  tablet 1   aspirin 81 MG chewable tablet Chew 1 tablet (81 mg total) by mouth daily. 90 tablet 0   atorvastatin (LIPITOR) 80 MG tablet Take 1 tablet (80 mg total) by mouth at bedtime. 90 tablet 1   cetirizine (ZYRTEC) 10 MG tablet Take 1 tablet (10 mg total) by mouth daily. 90 tablet 1   cloNIDine (CATAPRES) 0.3 MG tablet TAKE 1 TABLET BY MOUTH THREE TIMES DAILY(MORNING, NOON AND EVENING) 270 tablet 0   clopidogrel (PLAVIX) 75 MG tablet Take 75 mg by mouth daily.     docusate sodium (COLACE) 100 MG capsule Take 100 mg by mouth in the morning.     fluconazole (DIFLUCAN) 150 MG tablet Take 150 mg by mouth once.     fluticasone (FLONASE) 50 MCG/ACT nasal spray USE 1 SPRAY INTO BOTH NOSTRILS 2 TIMES A DAY (Patient taking differently: Place 1 spray into both nostrils at bedtime. USE 1 SPRAY INTO BOTH NOSTRILS 2 TIMES A DAY) 16 g 0   fluticasone-salmeterol (WIXELA INHUB) 250-50 MCG/ACT AEPB Inhale one puff into the lungs in the morning and at bedtime 60 each 11   furosemide (LASIX) 20 MG tablet Take 1 tablet (20 mg total) by mouth every other day. 16 tablet 3   losartan (COZAAR) 50 MG tablet Take 25 mg by mouth daily.     nystatin (MYCOSTATIN/NYSTOP) powder Apply 1 Application topically 3 (three) times daily. 30 g 5   omeprazole (PRILOSEC) 20 MG capsule Take 1 capsule (20 mg total) by mouth daily before breakfast. 90 capsule 1   pantoprazole (PROTONIX) 40 MG tablet Take 1 tablet (40 mg total) by mouth 2 (two) times daily before a meal. 180 tablet 2   potassium chloride (KLOR-CON M) 10 MEQ tablet Take 1 tablet (10 mEq total) by mouth daily. To take whenever you take lasix 90 tablet 1   promethazine (PHENERGAN) 25 MG tablet Take 1 tablet (25 mg total) by mouth as needed for nausea or vomiting. 30 tablet 2   Ubrogepant (UBRELVY) 100 MG TABS Take by mouth daily as needed.     No current facility-administered medications for this visit.     Allergies:   Coconut (cocos nucifera), Amoxicillin, Penicillins,  and Singulair [montelukast sodium]   Social History:  The patient  reports that she has been smoking cigarettes. She started smoking about 5 years ago. She has a 16.5 pack-year smoking history. She has never used smokeless tobacco. She reports current alcohol use. She reports that she does not use drugs.   Family History:   family history includes COPD in her mother; Coronary artery disease in her brother; Diabetes in her child; Healthy in her child; Heart disease in her brother, father, and mother; Hyperlipidemia in her brother, father, and mother; Hypertension in her brother, father, and sister; Kidney disease in her sister.    Review of Systems: Review of Systems  Constitutional: Negative.   HENT: Negative.    Respiratory: Negative.    Cardiovascular:  Positive for palpitations.  Gastrointestinal: Negative.   Musculoskeletal: Negative.   Neurological: Negative.   Psychiatric/Behavioral: Negative.    All other systems reviewed and are negative.   PHYSICAL EXAM: VS:  BP 126/68 (BP Location: Left Arm, Patient Position:  Sitting, Cuff Size: Normal)   Pulse 76   Ht 5\' 1"  (1.549 m)   Wt 164 lb (74.4 kg)   LMP 08/18/2020 (Approximate) Comment: Tubal ligation  SpO2 98%   BMI 30.99 kg/m  , BMI Body mass index is 30.99 kg/m. Constitutional:  oriented to person, place, and time. No distress.  HENT:  Head: Grossly normal Eyes:  no discharge. No scleral icterus.  Neck: No JVD, no carotid bruits  Cardiovascular: Regular rate and rhythm, 2/6 SEM right sternal border  pulmonary/Chest: Clear to auscultation bilaterally, no wheezes or rails Abdominal: Soft.  no distension.  no tenderness.  Musculoskeletal: Normal range of motion Neurological:  normal muscle tone. Coordination normal. No atrophy Skin: Skin warm and dry Psychiatric: normal affect, pleasant   Recent Labs: 08/19/2021: TSH 1.356 12/03/2021: Magnesium 1.9 04/22/2022: ALT 15 04/24/2022: Hemoglobin 10.4; Platelets  211 05/04/2022: BUN 35; Creatinine, Ser 2.14; Potassium 4.5; Sodium 140    Lipid Panel Lab Results  Component Value Date   CHOL 115 04/23/2022   HDL 26 (L) 04/23/2022   LDLCALC 69 04/23/2022   TRIG 100 04/23/2022      Wt Readings from Last 3 Encounters:  08/03/22 164 lb (74.4 kg)  07/28/22 162 lb (73.5 kg)  06/30/22 165 lb 3.2 oz (74.9 kg)     ASSESSMENT AND PLAN:  Problem List Items Addressed This Visit       Cardiology Problems   Essential hypertension   Relevant Medications   losartan (COZAAR) 50 MG tablet     Other   Chronic kidney disease   Other Visit Diagnoses     Cerebrovascular accident (CVA), unspecified mechanism (HCC)    -  Primary   Relevant Medications   losartan (COZAAR) 50 MG tablet   Other Relevant Orders   EKG 12-Lead (Completed)   Aortic valvular disease       Relevant Medications   losartan (COZAAR) 50 MG tablet   Other Relevant Orders   EKG 12-Lead (Completed)   Nonrheumatic mitral valve regurgitation       Relevant Medications   losartan (COZAAR) 50 MG tablet   Primary hypertension       Relevant Medications   losartan (COZAAR) 50 MG tablet   Other Relevant Orders   EKG 12-Lead (Completed)   Aortic valve stenosis, etiology of cardiac valve disease unspecified       Relevant Medications   losartan (COZAAR) 50 MG tablet   History of stroke       Aortic valve insufficiency, etiology of cardiac valve disease unspecified       Relevant Medications   losartan (COZAAR) 50 MG tablet   Hyperlipidemia LDL goal <70       Relevant Medications   losartan (COZAAR) 50 MG tablet       History of strokes On aspirin Plavix, loop monitor in place, most recent download no atrial fibrillation Followed by neurology, reports that she has diagnosis of migraines and has medications to take as needed  Aortic valve regurgitation/stenosis Seen by structural heart clinic in [place], Appears referral has been placed to CT surgery for further  discussion of her aortic valve disorder including stenosis with regurgitation Has dental appointment by report mid August but she is unaware of today We will set up right and left heart catheterization Following procedure incidental visit, referral to CT surgery On Lasix every other day BMP pending  Hyperlipidemia Cholesterol is at goal on the current lipid regimen. No changes to the medications were made.  Essential hypertension Blood pressure is well controlled on today's visit. No changes made to the medications.  Chronic renal insufficiency Lasix was decreased down to every other day by one of our colleagues in the office Precatheterization labs today May need prehydration for catheterization if creatinine continues to run high    Total encounter time more than 50 minutes  Greater than 50% was spent in counseling and coordination of care with the patient    Signed, Dossie Arbour, M.D., Ph.D. [facility] [place], [place] [phone]

## 2022-08-03 ENCOUNTER — Encounter: Payer: Self-pay | Admitting: Cardiovascular Disease

## 2022-08-03 ENCOUNTER — Ambulatory Visit: Payer: Medicaid Other | Attending: Cardiovascular Disease | Admitting: Cardiovascular Disease

## 2022-08-03 VITALS — BP 126/68 | HR 76 | Ht 61.0 in | Wt 164.0 lb

## 2022-08-03 DIAGNOSIS — Z8673 Personal history of transient ischemic attack (TIA), and cerebral infarction without residual deficits: Secondary | ICD-10-CM

## 2022-08-03 DIAGNOSIS — I350 Nonrheumatic aortic (valve) stenosis: Secondary | ICD-10-CM | POA: Diagnosis not present

## 2022-08-03 DIAGNOSIS — I340 Nonrheumatic mitral (valve) insufficiency: Secondary | ICD-10-CM

## 2022-08-03 DIAGNOSIS — I10 Essential (primary) hypertension: Secondary | ICD-10-CM | POA: Diagnosis not present

## 2022-08-03 DIAGNOSIS — N1832 Chronic kidney disease, stage 3b: Secondary | ICD-10-CM | POA: Diagnosis not present

## 2022-08-03 DIAGNOSIS — Z79899 Other long term (current) drug therapy: Secondary | ICD-10-CM | POA: Diagnosis not present

## 2022-08-03 DIAGNOSIS — I359 Nonrheumatic aortic valve disorder, unspecified: Secondary | ICD-10-CM

## 2022-08-03 DIAGNOSIS — E785 Hyperlipidemia, unspecified: Secondary | ICD-10-CM | POA: Diagnosis not present

## 2022-08-03 DIAGNOSIS — I351 Nonrheumatic aortic (valve) insufficiency: Secondary | ICD-10-CM

## 2022-08-03 DIAGNOSIS — I639 Cerebral infarction, unspecified: Secondary | ICD-10-CM

## 2022-08-03 MED ORDER — FUROSEMIDE 20 MG PO TABS: 20.0000 mg | ORAL_TABLET | ORAL | 3 refills | Status: DC

## 2022-08-03 MED ORDER — POTASSIUM CHLORIDE CRYS ER 10 MEQ PO TBCR: 10.0000 meq | EXTENDED_RELEASE_TABLET | ORAL | 3 refills | Status: DC

## 2022-08-03 NOTE — Patient Instructions (Addendum)
Medication Instructions:  No changes  If you need a refill on your cardiac medications before your next appointment, please call your pharmacy.   Lab work:  Your provider would like for you to have following labs drawn today BMP and CBC.    Testing/Procedures:   [place] [facility] DEPT OF [facility] AT [place] [address] [place] [address] Dept: [phone] Loc: [phone]  ITATY STRENGTH  08/03/2022  You are scheduled for a Cardiac Catheterization on Tuesday, August 6 with Dr. Cristal Deer End.  1. Please arrive at the [facility] Entrance of [facility], [address], [place] [address] at 6:30 AM (This is 1 hour(s) prior to your procedure time).  Proceed to the Check-In Desk directly inside the entrance.  Procedure Parking: Use the entrance off of the [address] side of the hospital. Turn right upon entering and follow the driveway to parking that is directly in front of the Heart & Vascular Center. There is no valet parking available at this entrance, however there is an awning directly in front of the Heart & Vascular Center for drop off/ pick up for patients.  Special note: Every effort is made to have your procedure done on time. Please understand that emergencies sometimes delay scheduled procedures.  2. Diet: Do not eat solid foods after midnight.  The patient may have clear liquids until 5am upon the day of the procedure.  3. Medication instructions in preparation for your procedure:   Contrast Allergy: No  Hold Lasix the morning of your procedure.   On the morning of your procedure, take your Aspirin 81 mg and Plavix 75 MG and any morning medicines NOT listed above.  You may use sips of water.  5. Plan to go home the same day, you will only stay overnight if medically necessary. 6. Bring a current list of your medications and current insurance cards. 7. You MUST have a  responsible person to drive you home. 8. Someone MUST be with you the first 24 hours after you arrive home or your discharge will be delayed. 9. Please wear clothes that are easy to get on and off and wear slip-on shoes.  Thank you for allowing [place] to care for you!   -- [facility]   Follow-Up: At [facility], you and your health needs are our priority.  As part of our continuing mission to provide you with exceptional heart care, we have created designated Provider Care Teams.  These Care Teams include your primary Cardiologist (physician) and Advanced Practice Providers (APPs -  Physician Assistants and Nurse Practitioners) who all work together to provide you with the care you need, when you need it.  You will need a follow up appointment in 6 months  Providers on your designated Care Team:   Nicolasa Ducking, NP Eula Listen, PA-C Cadence Fransico Michael, [place]  COVID-19 Vaccine Information can be found at: [email or web address] For questions related to vaccine distribution or appointments, please email [email or web address] or call [phone].

## 2022-08-04 ENCOUNTER — Telehealth: Payer: Self-pay | Admitting: Emergency Medicine

## 2022-08-04 ENCOUNTER — Other Ambulatory Visit: Payer: Self-pay

## 2022-08-04 ENCOUNTER — Telehealth: Payer: Self-pay

## 2022-08-04 NOTE — Telephone Encounter (Signed)
Patient called into [facility] to confirm appointment schedule for 08/10/2022 for heart catheterization.  Reviewed schedule and confirmed with patient to arrive at 0730 for her 0830 heart cath.  Patient verbalized understanding.

## 2022-08-04 NOTE — Telephone Encounter (Signed)
Called patient and informed her that she would need hydration for her procedure. Patient instructed to arrive at 06:30 for a procedure to start at 10:30. Patient verbalized understanding.

## 2022-08-04 NOTE — Patient Outreach (Signed)
No Telephone outreach to patient. Dr, Pearlean Brownie obtained mRS successfully 07/28/22. MRS= 2   Vanice Sarah [facility] Care Management Assistant [phone]

## 2022-08-04 NOTE — Telephone Encounter (Signed)
Called patient and informed her of the scheduled time of her heart catheterization. Patient instructed to arrive at 07:30 on 08/10/22 for 08:30 procedure. Patient verbalizes understanding.

## 2022-08-05 ENCOUNTER — Encounter: Payer: Self-pay | Admitting: Obstetrics and Gynecology

## 2022-08-05 ENCOUNTER — Other Ambulatory Visit: Payer: Medicaid Other | Admitting: Obstetrics and Gynecology

## 2022-08-05 ENCOUNTER — Other Ambulatory Visit: Payer: Self-pay | Admitting: Cardiovascular Disease

## 2022-08-05 DIAGNOSIS — I25118 Atherosclerotic heart disease of native coronary artery with other forms of angina pectoris: Secondary | ICD-10-CM

## 2022-08-05 DIAGNOSIS — I359 Nonrheumatic aortic valve disorder, unspecified: Secondary | ICD-10-CM

## 2022-08-05 NOTE — Patient Instructions (Signed)
Hi Ms. Loerch you for updating me today-gave a nice day!  Ms. Mont was given information about Medicaid Managed Care team care coordination services as a part of their [place] benefit. [name] verbally consented to engagement with the [facility] Managed Care team.   If you are experiencing a medical emergency, please call 911 or report to your local emergency department or urgent care.   If you have a non-emergency medical problem during routine business hours, please contact your provider's office and ask to speak with a nurse.   For questions related to your [place] health plan, please call: [phone]  If you would like to schedule transportation through your [place] plan, please call the following number at least 2 days in advance of your appointment: [phone].   There is no limit to the number of trips during the year between medical appointments, healthcare facilities, or pharmacies. Transportation must be scheduled at least 2 business days before but not more than thirty 30 days before of your appointment.  Call the Behavioral Health Crisis Line at [phone], at any time, 24 hours a day, 7 days a week. If you are in danger or need immediate medical attention call 911.  If you would like help to quit smoking, call 1-800-QUIT-NOW ((773)463-4955) OR Espaol: 1-855-Djelo-Ya (0-254-270-6237) o para ms informacin haga clic aqu or Text READY to 628-315 to register via text  Ms. Yetta Barre - following are the goals we discussed in your visit today:  Timeframe:  Long-Range Goal Priority:  High Start Date:  06/24/22                        Expected End Date: ongoing                      Follow Up Date 09/08/22   - practice safe sex - schedule appointment for vaccines needed due to my age or health - schedule recommended health tests (blood work, mammogram, colonoscopy, pap test) - schedule and keep appointment for  annual check-up    Why is this important?   Screening tests can find diseases early when they are easier to treat.  Your doctor or nurse will talk with you about which tests are important for you.  Getting shots for common diseases like the flu and shingles will help prevent them.  08/05/22:  Cardiac cath 8/6.  NEURO 8/26  Patient verbalizes understanding of instructions and care plan provided today and agrees to view in MyChart. Active MyChart status and patient understanding of how to access instructions and care plan via MyChart confirmed with patient.     The Managed Medicaid care management team will reach out to the patient again over the next 30 business  days.  The  Patient   has been provided with contact information for the Managed Medicaid care management team and has been advised to call with any health related questions or concerns.   [name], BSN [place]  [facility] Care Management Coordinator - Managed Medicaid High Risk [phone]   Following is a copy of your plan of care:  Care Plan : RN Care Manager Plan of Care  Updates made by [name], RN since 08/05/2022 12:00 AM     Problem: Health Promotion or Disease Self-Management (General Plan of Care)      Long-Range Goal: Chronic Disease Management   Start Date: 03/19/2022  Expected End Date:  09/24/2022  Priority: High  Note:   Current Barriers:  Knowledge Deficits related to plan of care for management of LVA, stroke, MI, migraines, HTN, TIA, AVD, asthma, rhinitis, COPD, sinusitis, dermatitis, CKD, tobacco use, HLD  Chronic Disease Management support and education needs related to LVA, stroke, MI, migraines, HTN, TIA, AVD, asthma, rhinitis, COPD, sinusitis, dermatitis, CKD, tobacco use, HLD  08/05/22:  Patient with cough since change in Lasix making it difficult to sleep at night-to f/u with provider.  BP stable-to have cardiac cath 8/6.  Smoking 1/2 ppd  RNCM Clinical Goal(s):  Patient  will verbalize understanding of plan for management of LVA, stroke, MI, migraines, HTN, TIA, AVD, asthma, rhinitis, COPD, sinusitis, dermatitis, CKD, tobacco use, HLD as evidenced by patient report verbalize basic understanding of LVA, stroke, MI, migraines, HTN, TIA, AVD, asthma, rhinitis, COPD, sinusitis, dermatitis, CKD, tobacco use, HLD   disease process and self health management plan as evidenced by patient report take all medications exactly as prescribed and will call provider for medication related questions as evidenced by patient report demonstrate understanding of rationale for each prescribed medication as evidenced by patient report attend all scheduled medical appointments as evidenced by patient report and EMR review demonstrate ongoing  adherence to prescribed treatment plan for LVA, stroke, MI, migraines, HTN, TIA, AVD, asthma, rhinitis, COPD, sinusitis, dermatitis, CKD, tobacco use, HLD    as evidenced by patient report and EMR review continue to work with RN Care Manager to address care management and care coordination needs related to LVA, stroke, MI, migraines, HTN, TIA, AVD, asthma, rhinitis, COPD, sinusitis, dermatitis, CKD, tobacco use, HLD  as evidenced by adherence to CM Team Scheduled appointments through collaboration with RN Care manager, provider, and care team.   Interventions: Inter-disciplinary care team collaboration (see longitudinal plan of care) Evaluation of current treatment plan related to  self management and patient's adherence to plan as established by provider  Smoking Cessation Interventions:  (Status:  New goal.) Long Term Goal Reviewed smoking history:   currently smoking less than 1ppd ppd-patient has no desire to quit smoking Evaluation of current treatment plan reviewed Reviewed scheduled/upcoming provider appointments  Discussed plans with patient for ongoing care management follow up and provided patient with direct contact information for care  management team Assessed social determinant of health barriers 08/05/22-has decreased smoking to 1/2 ppd  Asthma: (Status:New goal.) Long Term Goal Discussed the importance of adequate rest and management of fatigue with Asthma Assessed social determinant of health barriers    Chronic Kidney Disease Interventions:  (Status:  New goal.) Long Term Goal Evaluation of current treatment plan related to chronic kidney disease self management and patient's adherence to plan as established by provider      Reviewed prescribed diet Reviewed medications with patient and discussed importance of compliance    Reviewed scheduled/upcoming provider appointments including    Discussed plans with patient for ongoing care management follow up and provided patient with direct contact information for care management team    Assessed social determinant of health barriers    Last practice recorded BP readings:  BP Readings from Last 3 Encounters:           05/19/22         104/52 06/23/22         110/80 08/04/22         126/68  Most recent eGFR/CrCl:  Lab Results  Component Value Date   EGFR 39 (L) 11/19/2021    No components found  for: "CRCL"  COPD Interventions:  (Status:  New goal.) Long Term Goal Discussed the importance of adequate rest and management of fatigue with COPD Assessed social determinant of health barriers  Hyperlipidemia Interventions:  (Status:  New goal.) Long Term Goal Medication review performed; medication list updated in electronic medical record.  Provider established cholesterol goals reviewed Counseled on importance of regular laboratory monitoring as prescribed Reviewed importance of limiting foods high in cholesterol Assessed social determinant of health barriers   Hypertension Interventions:  (Status:  New goal.) Long Term Goal Last practice recorded BP readings:  BP Readings from Last 3 Encounters:           03/22/22         114/66 06/23/22          110/80 08/04/22         126/68  Most recent eGFR/CrCl:  Lab Results  Component Value Date   EGFR 39 (L) 11/19/2021    No components found for: "CRCL"  Evaluation of current treatment plan related to hypertension self management and patient's adherence to plan as established by provider Reviewed medications with patient and discussed importance of compliance Discussed plans with patient for ongoing care management follow up and provided patient with direct contact information for care management team Reviewed scheduled/upcoming provider appointments including:  Discussed complications of poorly controlled blood pressure such as heart disease, stroke, circulatory complications, vision complications, kidney impairment, sexual dysfunction Assessed social determinant of health barriers  Patient Goals/Self-Care Activities: Take all medications as prescribed Attend all scheduled provider appointments Call pharmacy for medication refills 3-7 days in advance of running out of medications Perform all self care activities independently  Perform IADL's (shopping, preparing meals, housekeeping, managing finances) independently Call provider office for new concerns or questions  Patient to follow up on dental providers/PCS services  Follow Up Plan:  The patient has been provided with contact information for the care management team and has been advised to call with any health related questions or concerns.  The care management team will reach out to the patient again over the next 45 business  days.

## 2022-08-05 NOTE — Patient Outreach (Signed)
Medicaid Managed Care   Nurse Care Manager Note  08/05/2022 Name:  Gabrielle Schultz MRN:  [id number] DOB:  [birth year]  Gabrielle Schultz is an 52 y.o. year old female who is a primary patient of Gabrielle Kuster, NP.  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 was given information about Medicaid Managed Care Coordination team services today. Gabrielle Schultz Patient agreed to services and verbal consent obtained.  Engaged with patient by telephone for follow up visit in response to provider referral for case management and/or care coordination services.   Assessments/Interventions:  Review of past medical history, allergies, medications, health status, including review of consultants reports, laboratory and other test data, was performed as part of comprehensive evaluation and provision of chronic care management services.  SDOH (Social Determinants of Health) assessments and interventions performed: SDOH Interventions    Flowsheet Row Patient Outreach Telephone from 08/05/2022 in [place] [facility] DEPARTMENT Patient Outreach Telephone from 06/24/2022 in [place] POPULATION HEALTH DEPARTMENT Patient Outreach Telephone from 05/20/2022 in [place] POPULATION HEALTH DEPARTMENT Telephone from 04/27/2022 in [facility] Community Care Coordination Patient Outreach Telephone from 04/20/2022 in [place] POPULATION HEALTH DEPARTMENT Office Visit from 05/22/2020 in [facility]  SDOH Interventions        Food Insecurity Interventions -- -- -- -- -- Other (Comment)  [food bank and husband's income]  Transportation Interventions -- -- -- Other (Comment)  [discussed with pt  transportation available through Medicaid-she does not use them as she is unable to take her 50yr old along the ride with her-states she is able to get family to get her to appts] -- Intervention Not Indicated  Utilities Interventions -- -- -- -- Intervention Not Indicated --  Alcohol Usage Interventions -- Intervention Not Indicated (Score <7) -- -- -- --  Financial Strain Interventions -- -- Intervention Not Indicated -- -- --  Physical Activity Interventions -- -- Other (Comments)  [patient starting PT] -- -- --  Stress Interventions Intervention Not Indicated -- -- -- Intervention Not Indicated --  Health Literacy Interventions Intervention Not Indicated -- -- -- -- --     Care Plan  Allergies  Allergen Reactions   Coconut (Cocos Nucifera) Hives and Swelling    Just coconut ("all coconut")   Amoxicillin Other (See Comments)    "makes me sick" "swelled up everywhere" Has patient had a PCN reaction causing immediate rash, facial/tongue/throat swelling, SOB or lightheadedness with hypotension: No Has patient had a PCN reaction causing severe rash involving mucus membranes or skin necrosis: No Has patient had a PCN reaction that required hospitalization: No Has patient had a PCN reaction occurring within the last 10 years: No If all of the above answers are "NO", then may proceed with Cephalosporin use.     Penicillins Other (See Comments)    "makes me sick" "swelled up oil" Has patient had a PCN reaction causing immediate rash, facial/tongue/throat swelling, SOB or lightheadedness with hypotension: No Has patient had a PCN reaction causing severe rash involving mucus membranes or skin necrosis: No Has patient had a PCN reaction that required hospitalization: No Has patient had a PCN reaction occurring within the last 10 years: Yes If all of the above answers are "  NO", then may proceed with Cephalosporin use.    Singulair [Montelukast Sodium] Rash    "made heart race"   Medications  Reviewed Today     Reviewed by [name], RN (Registered Nurse) on 08/05/22 at [phone]  Med List Status: <None>   Medication Order Taking? Sig Documenting Provider Last Dose Status Informant  acetaminophen (TYLENOL) 650 MG CR tablet [id number] No Take 650-1,300 mg by mouth every 8 (eight) hours as needed for pain. [provider] Taking Active Self, Pharmacy Records  albuterol (PROVENTIL HFA) 108 (90 Base) MCG/ACT inhaler [id number] No Inhale 2 puffs into the lungs once every 4 (four) hours as needed for wheezing (cough). Gabrielle Kuster, NP Taking Active Self, Pharmacy Records  allopurinol (ZYLOPRIM) 100 MG tablet [id number] No TAKE ONE TABLET BY MOUTH ONCE DAILY. Gabrielle Kuster, NP Taking Active Self, Pharmacy Records  amLODipine (NORVASC) 5 MG tablet [id number] No Take 1 tablet (5 mg total) by mouth daily. Gabrielle Kuster, NP Taking Active   aspirin 81 MG chewable tablet [id number] No Chew 1 tablet (81 mg total) by mouth daily. Mathews Argyle, NP Taking Active   atorvastatin (LIPITOR) 80 MG tablet [id number] No Take 1 tablet (80 mg total) by mouth at bedtime. Gabrielle Kuster, NP Taking Active   cetirizine (ZYRTEC) 10 MG tablet [id number] No Take 1 tablet (10 mg total) by mouth daily. Gabrielle Kuster, NP Taking Active   cloNIDine (CATAPRES) 0.3 MG tablet [id number] No TAKE 1 TABLET BY MOUTH THREE TIMES DAILY(MORNING, NOON AND EVENING) Abernathy, Alyssa, NP Taking Active   clopidogrel (PLAVIX) 75 MG tablet [id number] No Take 75 mg by mouth daily. [provider] Taking Active   docusate sodium (COLACE) 100 MG capsule [id number] No Take 100 mg by mouth in the morning. [provider] Taking Active Self, Pharmacy Records  fluconazole (DIFLUCAN) 150 MG tablet [id number] No Take 150 mg by mouth once. [provider] Taking Active   fluticasone (FLONASE) 50 MCG/ACT nasal spray [id number] No USE 1 SPRAY INTO BOTH NOSTRILS 2 TIMES A DAY  Patient taking differently:  Place 1 spray into both nostrils at bedtime. USE 1 SPRAY INTO BOTH NOSTRILS 2 TIMES A DAY   Wieting, Richard, MD Taking Active Self, Pharmacy Records  fluticasone-salmeterol [facility] INHUB) 250-50 MCG/ACT AEPB [id number] No Inhale one puff into the lungs in the morning and at bedtime Gabrielle Kuster, NP Taking Active Self, Pharmacy Records  furosemide (LASIX) 20 MG tablet [id number]  Take 1 tablet (20 mg total) by mouth every other day. Antonieta Iba, MD  Active   losartan (COZAAR) 50 MG tablet [id number] No Take 25 mg by mouth daily. [provider] Taking Active   nystatin (MYCOSTATIN/NYSTOP) powder [id number] No Apply 1 Application topically 3 (three) times daily. Gabrielle Kuster, NP Taking Active   omeprazole (PRILOSEC) 20 MG capsule [id number] No Take 1 capsule (20 mg total) by mouth daily before breakfast. Gabrielle Kuster, NP Taking Active Self, Pharmacy Records  pantoprazole (PROTONIX) 40 MG tablet [id number] No Take 1 tablet (40 mg total) by mouth 2 (two) times daily before a meal. Abernathy, Alyssa, NP Taking Active   potassium chloride (KLOR-CON M) 10 MEQ tablet [id number]  Take 1 tablet (10 mEq total) by mouth every other day. To take whenever you take lasix Mariah Milling, Tollie Pizza, MD  Active   promethazine (PHENERGAN) 25 MG tablet [id number] No Take 1 tablet (25 mg total) by mouth as needed for nausea or vomiting. Gabrielle Kuster, NP Taking Active Self, Pharmacy Records  Ubrogepant (  UBRELVY) 100 MG TABS [id number] No Take by mouth daily as needed. 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) 01/10/2022   Urge and stress incontinence 08/27/2021   COPD (chronic obstructive pulmonary disease) (HCC) 08/19/2021   History of TIA (transient ischemic attack) 07/22/2020    Hyperlipidemia    History of MI (myocardial infarction)    Migraines    Essential hypertension 12/23/2018   Chronic kidney disease 11/21/2013   Conditions to be addressed/monitored per PCP order:  Chronic healthcare management needs, HLD, LVA, sinusitis, dermatitis, CKD, GERD, tobacco use, gout, h/o multiple strokes, MI, migraines, HTN, TIA, AVD, asthma, rhinitis, COPD, CAD, 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  Care Plan : RN Care Manager Plan of Care  Updates made by [name], RN since 08/05/2022 12:00 AM     Problem: Health Promotion or Disease Self-Management (General Plan of Care)      Long-Range Goal: Chronic Disease Management   Start Date: 03/19/2022  Expected End Date: 09/24/2022  Priority: High  Note:   Current Barriers:  Knowledge Deficits related to plan of care for management of LVA, stroke, MI, migraines, HTN, TIA, AVD, asthma, rhinitis, COPD, sinusitis, dermatitis, CKD, tobacco use, HLD  Chronic Disease Management support and education needs related to LVA, stroke, MI, migraines, HTN, TIA, AVD, asthma, rhinitis, COPD, sinusitis, dermatitis, CKD, tobacco use, HLD  08/05/22:  Patient with cough since change in Lasix making it difficult to sleep at night-to f/u with provider.  BP stable-to have cardiac cath 8/6.  Smoking 1/2 ppd  RNCM Clinical Goal(s):  Patient will verbalize understanding of plan for management of LVA, stroke, MI, migraines, HTN, TIA, AVD, asthma, rhinitis, COPD, sinusitis, dermatitis, CKD, tobacco use, HLD as evidenced by patient report verbalize basic understanding of LVA, stroke, MI, migraines, HTN, TIA, AVD, asthma, rhinitis, COPD, sinusitis, dermatitis, CKD, tobacco use, HLD   disease process and self health management plan as evidenced by patient report take all medications exactly as prescribed and will call provider for medication related questions as evidenced by patient report demonstrate understanding of rationale for  each prescribed medication as evidenced by patient report attend all scheduled medical appointments as evidenced by patient report and EMR review demonstrate ongoing  adherence to prescribed treatment plan for LVA, stroke, MI, migraines, HTN, TIA, AVD, asthma, rhinitis, COPD, sinusitis, dermatitis, CKD, tobacco use, HLD    as evidenced by patient report and EMR review continue to work with RN Care Manager to address care management and care coordination needs related to LVA, stroke, MI, migraines, HTN, TIA, AVD, asthma, rhinitis, COPD, sinusitis, dermatitis, CKD, tobacco use, HLD  as evidenced by adherence to CM Team Scheduled appointments through collaboration with RN Care manager, provider, and care team.   Interventions: Inter-disciplinary care team collaboration (see longitudinal plan of care) Evaluation of current treatment plan related to  self management and patient's adherence to plan as established by provider  Smoking Cessation Interventions:  (Status:  New goal.) Long Term Goal Reviewed smoking history:   currently smoking less than 1ppd ppd-patient has no desire to quit smoking Evaluation of current treatment plan reviewed Reviewed scheduled/upcoming provider appointments  Discussed plans with patient for ongoing care management follow up and provided patient with direct contact information  for care management team Assessed social determinant of health barriers 08/05/22-has decreased smoking to 1/2 ppd  Asthma: (Status:New goal.) Long Term Goal Discussed the importance of adequate rest and management of fatigue with Asthma Assessed social determinant of health barriers    Chronic Kidney Disease Interventions:  (Status:  New goal.) Long Term Goal Evaluation of current treatment plan related to chronic kidney disease self management and patient's adherence to plan as established by provider      Reviewed prescribed diet Reviewed medications with patient and discussed importance of  compliance    Reviewed scheduled/upcoming provider appointments including    Discussed plans with patient for ongoing care management follow up and provided patient with direct contact information for care management team    Assessed social determinant of health barriers    Last practice recorded BP readings:  BP Readings from Last 3 Encounters:           05/19/22         104/52 06/23/22         110/80 08/04/22         126/68  Most recent eGFR/CrCl:  Lab Results  Component Value Date   EGFR 39 (L) 11/19/2021    No components found for: "CRCL"  COPD Interventions:  (Status:  New goal.) Long Term Goal Discussed the importance of adequate rest and management of fatigue with COPD Assessed social determinant of health barriers  Hyperlipidemia Interventions:  (Status:  New goal.) Long Term Goal Medication review performed; medication list updated in electronic medical record.  Provider established cholesterol goals reviewed Counseled on importance of regular laboratory monitoring as prescribed Reviewed importance of limiting foods high in cholesterol Assessed social determinant of health barriers   Hypertension Interventions:  (Status:  New goal.) Long Term Goal Last practice recorded BP readings:  BP Readings from Last 3 Encounters:           03/22/22         114/66 06/23/22         110/80 08/04/22         126/68  Most recent eGFR/CrCl:  Lab Results  Component Value Date   EGFR 39 (L) 11/19/2021    No components found for: "CRCL"  Evaluation of current treatment plan related to hypertension self management and patient's adherence to plan as established by provider Reviewed medications with patient and discussed importance of compliance Discussed plans with patient for ongoing care management follow up and provided patient with direct contact information for care management team Reviewed scheduled/upcoming provider appointments including:  Discussed complications of poorly  controlled blood pressure such as heart disease, stroke, circulatory complications, vision complications, kidney impairment, sexual dysfunction Assessed social determinant of health barriers  Patient Goals/Self-Care Activities: Take all medications as prescribed Attend all scheduled provider appointments Call pharmacy for medication refills 3-7 days in advance of running out of medications Perform all self care activities independently  Perform IADL's (shopping, preparing meals, housekeeping, managing finances) independently Call provider office for new concerns or questions  Patient to follow up on dental providers/PCS services  Follow Up Plan:  The patient has been provided with contact information for the care management team and has been advised to call with any health related questions or concerns.  The care management team will reach out to the patient again over the next 45 business  days.    Long-Range Goal: Establish Plan of Care for Chronic Disease Management Needs   Priority: High  Note:  Timeframe:  Long-Range Goal Priority:  High Start Date:  06/24/22                        Expected End Date: ongoing                      Follow Up Date 09/08/22   - practice safe sex - schedule appointment for vaccines needed due to my age or health - schedule recommended health tests (blood work, mammogram, colonoscopy, pap test) - schedule and keep appointment for annual check-up    Why is this important?   Screening tests can find diseases early when they are easier to treat.  Your doctor or nurse will talk with you about which tests are important for you.  Getting shots for common diseases like the flu and shingles will help prevent them.  08/05/22:  Cardiac cath 8/6.  NEURO 8/26   Follow Up:  Patient agrees to Care Plan and Follow-up.  Plan: The Managed Medicaid care management team will reach out to the patient again over the next 30 business  days. and The  Patient has been  provided with contact information for the Managed Medicaid care management team and has been advised to call with any health related questions or concerns.  Date/time of next scheduled RN care management/care coordination outreach: 09/08/22 at 1030

## 2022-08-06 NOTE — Progress Notes (Signed)
Carelink Summary Report / Loop Recorder 

## 2022-08-10 ENCOUNTER — Encounter
Admission: RE | Disposition: A | Discharge status: Home / Self Care | Payer: Self-pay | Source: Home / Self Care | Attending: Internal Medicine

## 2022-08-10 ENCOUNTER — Other Ambulatory Visit: Payer: Self-pay

## 2022-08-10 ENCOUNTER — Telehealth: Payer: Self-pay

## 2022-08-10 ENCOUNTER — Encounter: Payer: Self-pay | Admitting: Internal Medicine

## 2022-08-10 ENCOUNTER — Ambulatory Visit
Admission: RE | Admit: 2022-08-10 | Discharge: 2022-08-10 | Disposition: A | Discharge status: Home / Self Care | Payer: Medicaid Other | Source: Home / Self Care | Attending: Internal Medicine | Admitting: Internal Medicine

## 2022-08-10 DIAGNOSIS — N184 Chronic kidney disease, stage 4 (severe): Secondary | ICD-10-CM | POA: Insufficient documentation

## 2022-08-10 DIAGNOSIS — I350 Nonrheumatic aortic (valve) stenosis: Secondary | ICD-10-CM

## 2022-08-10 DIAGNOSIS — Z79899 Other long term (current) drug therapy: Secondary | ICD-10-CM | POA: Insufficient documentation

## 2022-08-10 DIAGNOSIS — I2510 Atherosclerotic heart disease of native coronary artery without angina pectoris: Secondary | ICD-10-CM | POA: Diagnosis not present

## 2022-08-10 DIAGNOSIS — Z8673 Personal history of transient ischemic attack (TIA), and cerebral infarction without residual deficits: Secondary | ICD-10-CM | POA: Insufficient documentation

## 2022-08-10 DIAGNOSIS — E785 Hyperlipidemia, unspecified: Secondary | ICD-10-CM | POA: Diagnosis not present

## 2022-08-10 DIAGNOSIS — F17210 Nicotine dependence, cigarettes, uncomplicated: Secondary | ICD-10-CM | POA: Insufficient documentation

## 2022-08-10 DIAGNOSIS — I272 Pulmonary hypertension, unspecified: Secondary | ICD-10-CM | POA: Diagnosis not present

## 2022-08-10 DIAGNOSIS — Z7902 Long term (current) use of antithrombotics/antiplatelets: Secondary | ICD-10-CM | POA: Insufficient documentation

## 2022-08-10 DIAGNOSIS — I129 Hypertensive chronic kidney disease with stage 1 through stage 4 chronic kidney disease, or unspecified chronic kidney disease: Secondary | ICD-10-CM | POA: Diagnosis not present

## 2022-08-10 DIAGNOSIS — Z7982 Long term (current) use of aspirin: Secondary | ICD-10-CM | POA: Insufficient documentation

## 2022-08-10 DIAGNOSIS — I359 Nonrheumatic aortic valve disorder, unspecified: Secondary | ICD-10-CM | POA: Diagnosis present

## 2022-08-10 HISTORY — PX: RIGHT/LEFT HEART CATH AND CORONARY ANGIOGRAPHY: CATH118266

## 2022-08-10 LAB — POCT I-STAT 7, (LYTES, BLD GAS, ICA,H+H)
Acid-base deficit: 8 mmol/L — ABNORMAL HIGH (ref 0.0–2.0)
Bicarbonate: 16.5 mmol/L — ABNORMAL LOW (ref 20.0–28.0)
Calcium, Ion: 1.19 mmol/L (ref 1.15–1.40)
HCT: 30 % — ABNORMAL LOW (ref 36.0–46.0)
Hemoglobin: 10.2 g/dL — ABNORMAL LOW (ref 12.0–15.0)
O2 Saturation: 91 %
Potassium: 3.6 mmol/L (ref 3.5–5.1)
Sodium: 143 mmol/L (ref 135–145)
TCO2: 17 mmol/L — ABNORMAL LOW (ref 22–32)
pCO2 arterial: 28.1 mmHg — ABNORMAL LOW (ref 32–48)
pH, Arterial: 7.375 (ref 7.35–7.45)
pO2, Arterial: 62 mmHg — ABNORMAL LOW (ref 83–108)

## 2022-08-10 LAB — POCT I-STAT EG7
Acid-base deficit: 7 mmol/L — ABNORMAL HIGH (ref 0.0–2.0)
Bicarbonate: 18 mmol/L — ABNORMAL LOW (ref 20.0–28.0)
Calcium, Ion: 1.2 mmol/L (ref 1.15–1.40)
HCT: 30 % — ABNORMAL LOW (ref 36.0–46.0)
Hemoglobin: 10.2 g/dL — ABNORMAL LOW (ref 12.0–15.0)
O2 Saturation: 67 %
Potassium: 3.6 mmol/L (ref 3.5–5.1)
Sodium: 143 mmol/L (ref 135–145)
TCO2: 19 mmol/L — ABNORMAL LOW (ref 22–32)
pCO2, Ven: 32.8 mmHg — ABNORMAL LOW (ref 44–60)
pH, Ven: 7.346 (ref 7.25–7.43)
pO2, Ven: 36 mmHg (ref 32–45)

## 2022-08-10 SURGERY — RIGHT/LEFT HEART CATH AND CORONARY ANGIOGRAPHY
Anesthesia: Moderate Sedation | Laterality: Bilateral

## 2022-08-10 SURGERY — RIGHT/LEFT HEART CATH AND CORONARY ANGIOGRAPHY
Anesthesia: Moderate Sedation

## 2022-08-10 MED ORDER — ALBUTEROL SULFATE (2.5 MG/3ML) 0.083% IN NEBU
2.5000 mg | INHALATION_SOLUTION | RESPIRATORY_TRACT | Status: DC | PRN
  Administered 2022-08-10: 2.5 mg via RESPIRATORY_TRACT

## 2022-08-10 MED ORDER — FENTANYL CITRATE (PF) 100 MCG/2ML IJ SOLN
INTRAMUSCULAR | Status: AC
  Filled 2022-08-10: qty 2

## 2022-08-10 MED ORDER — MORPHINE SULFATE (PF) 2 MG/ML IV SOLN
INTRAVENOUS | Status: AC
  Filled 2022-08-10: qty 1

## 2022-08-10 MED ORDER — IOHEXOL 300 MG/ML  SOLN
INTRAMUSCULAR | Status: DC | PRN
  Administered 2022-08-10: 27 mL

## 2022-08-10 MED ORDER — HEPARIN (PORCINE) IN NACL 2000-0.9 UNIT/L-% IV SOLN
INTRAVENOUS | Status: DC | PRN
  Administered 2022-08-10: 1000 mL

## 2022-08-10 MED ORDER — FUROSEMIDE 40 MG PO TABS: 40.0000 mg | ORAL_TABLET | Freq: Every day | ORAL | 3 refills | Status: DC

## 2022-08-10 MED ORDER — MIDAZOLAM HCL 2 MG/2ML IJ SOLN
INTRAMUSCULAR | Status: AC
  Filled 2022-08-10: qty 2

## 2022-08-10 MED ORDER — MORPHINE SULFATE (PF) 4 MG/ML IV SOLN
2.0000 mg | INTRAVENOUS | Status: DC | PRN
  Administered 2022-08-10: 2 mg via INTRAVENOUS

## 2022-08-10 MED ORDER — MORPHINE SULFATE (PF) 4 MG/ML IV SOLN: 2.0000 mg | INTRAVENOUS | Status: DC | PRN

## 2022-08-10 MED ORDER — HYDRALAZINE HCL 20 MG/ML IJ SOLN
INTRAMUSCULAR | Status: AC
  Filled 2022-08-10: qty 1

## 2022-08-10 MED ORDER — LIDOCAINE HCL (PF) 1 % IJ SOLN
INTRAMUSCULAR | Status: DC | PRN
  Administered 2022-08-10: 30 mL

## 2022-08-10 MED ORDER — ALBUTEROL SULFATE (2.5 MG/3ML) 0.083% IN NEBU
INHALATION_SOLUTION | RESPIRATORY_TRACT | Status: AC
  Filled 2022-08-10: qty 3

## 2022-08-10 MED ORDER — SODIUM CHLORIDE 0.9 % WEIGHT BASED INFUSION: 1.0000 mL/kg/h | INTRAVENOUS | Status: DC

## 2022-08-10 MED ORDER — SODIUM CHLORIDE 0.9% FLUSH: 3.0000 mL | Freq: Two times a day (BID) | INTRAVENOUS | Status: DC

## 2022-08-10 MED ORDER — SODIUM CHLORIDE 0.9 % IV SOLN: 250.0000 mL | INTRAVENOUS | Status: DC | PRN

## 2022-08-10 MED ORDER — HYDRALAZINE HCL 20 MG/ML IJ SOLN
INTRAMUSCULAR | Status: DC | PRN
  Administered 2022-08-10: 10 mg via INTRAVENOUS

## 2022-08-10 MED ORDER — MORPHINE SULFATE (PF) 4 MG/ML IV SOLN
INTRAVENOUS | Status: AC
  Filled 2022-08-10: qty 1

## 2022-08-10 MED ORDER — FENTANYL CITRATE (PF) 100 MCG/2ML IJ SOLN
INTRAMUSCULAR | Status: DC | PRN
  Administered 2022-08-10 (×2): 25 ug via INTRAVENOUS

## 2022-08-10 MED ORDER — ACETAMINOPHEN 325 MG PO TABS: 650.0000 mg | ORAL_TABLET | ORAL | Status: DC | PRN

## 2022-08-10 MED ORDER — SODIUM CHLORIDE 0.9% FLUSH: 3.0000 mL | INTRAVENOUS | Status: DC | PRN

## 2022-08-10 MED ORDER — HEPARIN SODIUM (PORCINE) 1000 UNIT/ML IJ SOLN
INTRAMUSCULAR | Status: AC
  Filled 2022-08-10: qty 10

## 2022-08-10 MED ORDER — ONDANSETRON HCL 4 MG/2ML IJ SOLN
4.0000 mg | Freq: Four times a day (QID) | INTRAMUSCULAR | Status: DC | PRN
  Administered 2022-08-10: 4 mg via INTRAVENOUS

## 2022-08-10 MED ORDER — LIDOCAINE HCL 1 % IJ SOLN
INTRAMUSCULAR | Status: AC
  Filled 2022-08-10: qty 20

## 2022-08-10 MED ORDER — ASPIRIN 81 MG PO CHEW: 81.0000 mg | CHEWABLE_TABLET | ORAL | Status: DC

## 2022-08-10 MED ORDER — VANCOMYCIN HCL IN DEXTROSE 1-5 GM/200ML-% IV SOLN
INTRAVENOUS | Status: AC
  Filled 2022-08-10: qty 200

## 2022-08-10 MED ORDER — VERAPAMIL HCL 2.5 MG/ML IV SOLN
INTRAVENOUS | Status: AC
  Filled 2022-08-10: qty 2

## 2022-08-10 MED ORDER — SODIUM CHLORIDE 0.9 % WEIGHT BASED INFUSION
3.0000 mL/kg/h | INTRAVENOUS | Status: DC
  Administered 2022-08-10: 3 mL/kg/h via INTRAVENOUS

## 2022-08-10 MED ORDER — MIDAZOLAM HCL 2 MG/2ML IJ SOLN
INTRAMUSCULAR | Status: DC | PRN
  Administered 2022-08-10 (×2): 1 mg via INTRAVENOUS

## 2022-08-10 MED ORDER — HEPARIN (PORCINE) IN NACL 1000-0.9 UT/500ML-% IV SOLN
INTRAVENOUS | Status: AC
  Filled 2022-08-10: qty 1000

## 2022-08-10 MED ORDER — HYDRALAZINE HCL 20 MG/ML IJ SOLN: 10.0000 mg | INTRAMUSCULAR | Status: DC | PRN

## 2022-08-10 MED ORDER — ONDANSETRON HCL 4 MG/2ML IJ SOLN
INTRAMUSCULAR | Status: AC
  Filled 2022-08-10: qty 2

## 2022-08-10 SURGICAL SUPPLY — 15 items
CANNULA 5F STIFF (CANNULA) IMPLANT
CATH BALLN WEDGE 5F 110CM (CATHETERS) IMPLANT
CATH INFINITI 5FR JL5 (CATHETERS) IMPLANT
CATH INFINITI 5FR MULTPACK ANG (CATHETERS) IMPLANT
DRAPE BRACHIAL (DRAPES) IMPLANT
GLIDESHEATH SLEND SS 6F .021 (SHEATH) IMPLANT
GUIDEWIRE INQWIRE 1.5J.035X260 (WIRE) IMPLANT
INQWIRE 1.5J .035X260CM (WIRE)
PACK CARDIAC CATH (CUSTOM PROCEDURE TRAY) ×1 IMPLANT
PROTECTION STATION PRESSURIZED (MISCELLANEOUS) ×1
SET ATX-X65L (MISCELLANEOUS) IMPLANT
SHEATH AVANTI 5FR X 11CM (SHEATH) IMPLANT
SHEATH GLIDE SLENDER 4/5FR (SHEATH) IMPLANT
STATION PROTECTION PRESSURIZED (MISCELLANEOUS) IMPLANT
WIRE GUIDERIGHT .035X150 (WIRE) IMPLANT

## 2022-08-10 NOTE — Telephone Encounter (Signed)
BMP ordered as requested   End, Cristal Deer, MD  P Cv Div Burl Scheduling; P Cv Div Burl Triage Good afternoon,  Could you help arrange for [name] to have a BMP on Friday or Monday and see Dr. Mariah Milling or an APP in 2-3 weeks if possible?  Thanks.  Thayer Ohm

## 2022-08-10 NOTE — Interval H&P Note (Signed)
History and Physical Interval Note:  08/10/2022 10:47 AM  Gabrielle Schultz  has presented today for surgery, with the diagnosis of aortic valve disease.  The various methods of treatment have been discussed with the patient and family. After consideration of risks, benefits and other options for treatment, the patient has consented to  Procedure(s): RIGHT/LEFT HEART CATH AND CORONARY ANGIOGRAPHY (Bilateral) as a surgical intervention.  The patient's history has been reviewed, patient examined, no change in status, stable for surgery.  I have reviewed the patient's chart and labs.  Questions were answered to the patient's satisfaction.    Cath Lab Visit (complete for each Cath Lab visit)  Clinical Evaluation Leading to the Procedure:   ACS: No.  Non-ACS:    Anginal Classification: No Symptoms  Anti-ischemic medical therapy: Minimal Therapy (1 class of medications)  Non-Invasive Test Results: No non-invasive testing performed  Prior CABG: No previous CABG   

## 2022-08-11 ENCOUNTER — Encounter: Payer: Self-pay | Admitting: Internal Medicine

## 2022-08-11 ENCOUNTER — Other Ambulatory Visit: Payer: Self-pay | Admitting: Nurse Practitioner

## 2022-08-11 NOTE — Telephone Encounter (Signed)
Ok to send

## 2022-08-13 NOTE — Telephone Encounter (Signed)
Spoke with pt, she stated she would have labs completed on Monday.  Per chart review, follow appointment scheduled for 8/27.

## 2022-08-19 ENCOUNTER — Telehealth: Payer: Self-pay | Admitting: Cardiology

## 2022-08-19 ENCOUNTER — Other Ambulatory Visit: Payer: Self-pay

## 2022-08-19 NOTE — Telephone Encounter (Signed)
Returned call to Pt.  Advised not to take monitor for a 3 day vacation to the beach.  All questions answered.

## 2022-08-19 NOTE — Telephone Encounter (Signed)
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 She says she is going to the beach, she wants to know if she needs to take the box with her to the beach?ew

## 2022-08-27 NOTE — Progress Notes (Unsigned)
NEUROLOGY FOLLOW UP OFFICE NOTE  Gabrielle Schultz [id number]  Assessment/Plan:     Acute right parietal ischemic infarct s/p TNK, cryptogenic but concerning for paroxysmal a fib. 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   Migraine without aura, without status migrainosus, not intractable Tobacco use disorder - less than 1 ppd.   Stroke management: Secondary stroke prevention as managed by PCP/cardiology: ASA 81mg  daily, Plavix 75mg  daily Normotensive blood pressure (goal less than 130/90) Statin.  LDL goal less than 70 Hgb G2X goal less than 6.5% Migraines  Ubrelvy 100mg  as needed Preventative medication not indicated Follow up 9 months.   Subjective:  Gabrielle Schultz is a 52 year old right-handed female with HTN, CKD, COPD, tobacco use disorder, ischemic colitis and history of strokes who follows up for stroke. Recent CT/MRI brain and MRA head and neck personally reviewed.  She is accompanied by her daughter who supplements history.   UPDATE: Current medications:  ASA 81mg , Plavix 75mg , clonidine, amlodipine, clonidine, atorvastatin 40mg  daily, Ubrelvy (samples)   She followed up with Dr. Pearlean Brownie in the Stroke Clinic at [facility] last month.  Recommended maximal medication management.   Now with Loop recorder.  Had cardiac cath on 8/6.  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 Migraine last 30 minutes after use.  Last migraine was 3 weeks ago.  Averages 2-3 migraines a month.      HISTORY: She had a stroke at age 50.  She doesn't remember her symptoms.   She had a second stroke in addition to an acute ischemic colitis in 2014.  She does not remember her symptoms but records  report right homonymous hemianopsia and right arm and leg weakness and numbness of 2 months duration.   MRI of brain without contrast from 12/29/2012 demonstrated "[R]emote moderate size left occipital/posterior inferior left temporal lobe infarct with small area of involvement of the inferior left parietal lobe.  Associated encephalomalacia and laminar necrosis.  No evidence of acute infarct.  Remote small left frontal lobe infarct.  Mild to moderate nonspecific white matter type changes.Marland KitchenMarland KitchenGlobal atrophy without hydrocephalus."  She reports that no cause for her strokes were ever found.  Following her stroke, she never underwent PT/OT because she had no insurance.  Since the second stroke, she continues to have aching and cramping of her hand down to her elbow with use of her hand.  She also has numbness involving the first 3 digits of her right hand.  She has history of carpal tunnel syndrome diagnosed many years ago, so she tried wearing a brace.  NCV-EMG of right upper extremity on 11/23/2018 was normal.   She was admitted to [facility] in November 2021 for a third stroke presenting with sudden left sided weakness.  Blood pressure was in the 200s systolic.  CT head was negative for bleed.  She received IV tPA.  MRI of brain showed small acute posterior right frontal lobe infarct.  2D echo showed EF 65-70% with no source of embolus.  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  Blood cultures were negative.  [employer] Virus 2 negative, LDL 97, and Hgb A1c 5.7.  Hypercoagulable panel showed homocysteine 52 (thought to be secondary to smoking), antithrombin III 121, protein C 109, B2GPI negative, lupus anticoagulant panel negative and cardiolipin negative.  She was discharged on ASA and Plavix 75mg  daily for 3 weeks followed by Plavix alone.  Zocor was increased to 40mg   daily, however her PCP reduced dose due to her kidney disease.    On 07/21/2020, she developed sudden onset right sided weakness and aphasia lasting 20 minutes.  She went to the ED where CT and MRI of brain showed chronic cortical infarcts, primarily left occipital lobe, but no acute stroke.  MRA of head limited due to motion but no large vessel occlusion. Carotid ultrasound negative for hemodynamically significant stenosis.  Echocardiogram showed EF 65-70% with negative bubble study.  LDL was 119 and Hgb A1c was 5.9.  She was found to be positive for COVID.  Discharged on ASA and Plavix for 3 weeks followed by Plavix alone.  Simvastatin was changed to atorvastatin 40mg .  Unclear if this was a left hemispheric transient ischemic attack vs recrudescence of previous stroke symptoms in setting of COVID.   Admitted to [facility] for another stroke.  She noted transient bilateral leg weakness ("legs felt like jelly").  She also woke up that morning feeling dizzy.  In the hospital, patient was without neurologic deficits.  MRI of brain showed probable subacute right parietal infarct with cortical laminar necrosis as well as chronic small vessel ischemic changes with remote infarcts in the left occipital lobe, left frontal lobe, and cerebellum.  MRA head and neck was normal.  2D echo showed LVEF 60-65% with aortic valve sclerosis and moderate regurg and negative bubble study.  Labs included LDL 78, negative Factor V leiden gene muation and negative HIV.  She had an elevated troponin determined to be secondary to stroke. 12 day cardiac event monitor negative for afib.  Currently taking ASA and Plavix  On 10/27/2021 she developed left arm numbness and heaviness that resolved after 15 minutes, similar to previous stroke symptoms from August.  CT head showed remote infarcts but no acute abnormality.  She had recent change to her blood pressure medication and it was thought that she had recrudescence  of symptoms due to hypotension.    On 01/09/2022, she developed right sided facial droop and slurred speech.  She was seen at [facility] where CT and follow up MRI of brain revealed no acute stroke.  2D echo revealed EF 60-65% with moderate to severe aortic valve regurgitation.    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  Plan was to have cardiac cath followed by referral to cardiovascular surgery for possible mechanical aortic valve.  On 4/18, she developed sudden onset left sided weakness with right sided facial droop and slurred speech.  Admitted to [facility] where she was given TKN.  Symptoms resolved after 4-5 hours followed by headache lasting 1 hour, similar semiology to prior episodes.  CT head showed old left PCA infarct but no acute findings.  CTA of head and neck was negative for LVO or hemodynamically significant stenosis.  MRI of brain revealed acute parietal ischemic infarct.  On ASA 81mg  and Plavix prior admission.  Discharged on ASA 81mg  and Brilinta 90mg  BID for secondary stroke prevention for 1 month followed by ASA 81mg  monotherapy.  However, she could not tolerate the Brilinta as it caused headache,  so she was told to stop it and resumed Plavix.  Plan is to follow up with cardiovascular thoracic surgery.  If surgery not indicated, then plan is for outpatient loop recorder to further evaluate for a fib.    History of migraines with severe diffuse pounding headache, photophobia and phonophobia lasting less than 3 hours and occurring every 2 to 3 weeks.  She has had recurrent episodes of right facial droop and slurred speech followed by headache, concerning for complicated migraine.      PAST MEDICAL HISTORY: Past Medical History:  Diagnosis Date   Acid reflux    Allergic rhinitis 06/26/2015   Allergy    Asthma    Carpal tunnel syndrome     right hand   Chronic kidney disease    Colitis    CVA (cerebral infarction)    2009, 2015   Gout    History of syncope    Hypertension 2008   Low HDL (under 40) 06/26/2015   MI (myocardial infarction) (HCC)    2015 - patient wasnt told by cardiologist that she had MI but has had cardiac cath in past   Migraines    Poor circulation    Renal insufficiency    Stroke [facility])    Syncope and collapse     MEDICATIONS: Current Outpatient Medications on File Prior to Visit  Medication Sig Dispense Refill   acetaminophen (TYLENOL) 650 MG CR tablet Take 650-1,300 mg by mouth every 8 (eight) hours as needed for pain.     albuterol (PROVENTIL HFA) 108 (90 Base) MCG/ACT inhaler Inhale 2 puffs into the lungs once every 4 (four) hours as needed for wheezing (cough). 18 g 5   allopurinol (ZYLOPRIM) 100 MG tablet TAKE ONE TABLET BY MOUTH ONCE DAILY. 90 tablet 3   amLODipine (NORVASC) 5 MG tablet Take 1 tablet (5 mg total) by mouth daily. 90 tablet 1   aspirin 81 MG chewable tablet Chew 1 tablet (81 mg total) by mouth daily. 90 tablet 0   atorvastatin (LIPITOR) 80 MG tablet Take 1 tablet (80 mg total) by mouth at bedtime. 90 tablet 1   cetirizine (ZYRTEC) 10 MG tablet Take 1 tablet (10 mg total) by mouth daily. 90 tablet 1   cloNIDine (CATAPRES) 0.3 MG tablet TAKE 1 TABLET BY MOUTH THREE TIMES DAILY(MORNING, NOON AND EVENING) 270 tablet 0   clopidogrel (PLAVIX) 75 MG tablet Take 75 mg by mouth daily.     docusate sodium (COLACE) 100 MG capsule Take 100 mg by mouth in the morning.     fluconazole (DIFLUCAN) 150 MG tablet TAKE 1 TABLET(150 MG) BY MOUTH 1 TIME FOR 1 DOSE. MAY TAKE AN ADDITIONAL DOSE AFTER 3 DAYS IF STILL SYMPTOMATIC 3 tablet 0   fluticasone (FLONASE) 50 MCG/ACT nasal spray USE 1 SPRAY INTO BOTH NOSTRILS 2 TIMES A DAY (Patient taking differently: Place 1 spray into both nostrils at bedtime. USE 1 SPRAY INTO BOTH NOSTRILS 2 TIMES A DAY) 16 g 0   fluticasone-salmeterol (WIXELA INHUB) 250-50  MCG/ACT AEPB Inhale one puff into the lungs in the morning and at bedtime 60 each 11   furosemide (LASIX) 40 MG tablet Take 1 tablet (40 mg total) by mouth daily. 90 tablet 3   losartan (COZAAR) 50 MG tablet Take 25 mg by mouth daily.     nystatin (MYCOSTATIN/NYSTOP) powder Apply 1 Application topically 3 (three) times daily. 30 g 5   pantoprazole (PROTONIX) 40 MG tablet Take 1 tablet (40 mg total) by mouth  2 (two) times daily before a meal. 180 tablet 2   potassium chloride (KLOR-CON M) 10 MEQ tablet Take 1 tablet (10 mEq total) by mouth every other day. To take whenever you take lasix 45 tablet 3   promethazine (PHENERGAN) 25 MG tablet Take 1 tablet (25 mg total) by mouth as needed for nausea or vomiting. 30 tablet 2   Ubrogepant (UBRELVY) 100 MG TABS Take by mouth daily as needed.     No current facility-administered medications on file prior to visit.     ALLERGIES: Allergies  Allergen Reactions   Coconut (Cocos Nucifera) Hives and Swelling    Just coconut ("all coconut")   Amoxicillin Other (See Comments)    "makes me sick" "swelled up everywhere" Has patient had a PCN reaction causing immediate rash, facial/tongue/throat swelling, SOB or lightheadedness with hypotension: No Has patient had a PCN reaction causing severe rash involving mucus membranes or skin necrosis: No Has patient had a PCN reaction that required hospitalization: No Has patient had a PCN reaction occurring within the last 10 years: No If all of the above answers are "NO", then may proceed with Cephalosporin use.     Penicillins Other (See Comments)    "makes me sick" "swelled up oil" Has patient had a PCN reaction causing immediate rash, facial/tongue/throat swelling, SOB or lightheadedness with hypotension: No Has patient had a PCN reaction causing severe rash involving mucus membranes or skin necrosis: No Has patient had a PCN reaction that required hospitalization: No Has patient had a PCN reaction occurring  within the last 10 years: Yes If all of the above answers are "NO", then may proceed with Cephalosporin use.    Singulair [Montelukast Sodium] Rash    "made heart race"    FAMILY HISTORY: Family History  Problem Relation Age of Onset   Hyperlipidemia Mother    Heart disease Mother        has a defibrillator   COPD Mother    Hypertension Father    Heart disease Father    Hyperlipidemia Father    Hypertension Sister        died in her 30s from heart failure and end stage kidney disease   Kidney disease Sister    Hypertension Brother    Heart disease Brother        has a defibrillator   Coronary artery disease Brother        had CABG   Hyperlipidemia Brother    Healthy Child    Diabetes Child       Objective:  Blood pressure (!) 104/52, pulse 96, height 5\' 1"  (1.549 m), weight 164 lb 6.4 oz (74.6 kg), last menstrual period 08/18/2020, SpO2 96 %. General: No acute distress.  Patient appears well-groomed.   Head:  Normocephalic/atraumatic Eyes:  Fundi examined but not visualized Neck: supple, no paraspinal tenderness, full range of motion Heart:  Regular rate and rhythm Neurological Exam: Alert and oriented.  Speech fluent and not dysarthric.  Language intact.  Right homonymous hemianopsia.  Otherwise, CN II-XII intact.  Bulk and tone normal.  Muscle strength 5-/5 right deltoid and  left hand grip, otherwise 5/5.  Sensation to temperature and vibration intact.  Deep tendon reflexes 2+ throughout, toes downgoing.  Finger to nose testing intact.  Broad-based cautious gait.  Romberg negative.   Shon Millet, DO  CC: Shane Crutch, [place]

## 2022-08-30 ENCOUNTER — Encounter: Payer: Self-pay | Admitting: Neurology

## 2022-08-30 ENCOUNTER — Ambulatory Visit: Payer: Medicaid Other | Admitting: Neurology

## 2022-08-30 VITALS — BP 114/62 | HR 74 | Ht 61.0 in | Wt 163.2 lb

## 2022-08-30 DIAGNOSIS — E785 Hyperlipidemia, unspecified: Secondary | ICD-10-CM

## 2022-08-30 DIAGNOSIS — I10 Essential (primary) hypertension: Secondary | ICD-10-CM | POA: Diagnosis not present

## 2022-08-30 DIAGNOSIS — Z720 Tobacco use: Secondary | ICD-10-CM

## 2022-08-30 DIAGNOSIS — I359 Nonrheumatic aortic valve disorder, unspecified: Secondary | ICD-10-CM | POA: Diagnosis not present

## 2022-08-30 DIAGNOSIS — I639 Cerebral infarction, unspecified: Secondary | ICD-10-CM | POA: Diagnosis not present

## 2022-08-30 MED ORDER — UBRELVY 100 MG PO TABS: 1.0000 | ORAL_TABLET | ORAL | 11 refills | Status: DC | PRN

## 2022-08-30 NOTE — Patient Instructions (Signed)
Take ubrelvy as needed.  May repeat after 2 hours.  Maximum 2 tablets in 24 hours.

## 2022-08-31 ENCOUNTER — Ambulatory Visit (INDEPENDENT_AMBULATORY_CARE_PROVIDER_SITE_OTHER): Payer: Medicaid Other

## 2022-08-31 ENCOUNTER — Ambulatory Visit: Payer: Medicaid Other | Attending: Cardiology | Admitting: Cardiology

## 2022-08-31 ENCOUNTER — Encounter: Payer: Self-pay | Admitting: Cardiology

## 2022-08-31 VITALS — BP 130/52 | HR 52 | Ht 61.0 in | Wt 161.0 lb

## 2022-08-31 DIAGNOSIS — I639 Cerebral infarction, unspecified: Secondary | ICD-10-CM

## 2022-08-31 DIAGNOSIS — I10 Essential (primary) hypertension: Secondary | ICD-10-CM | POA: Diagnosis not present

## 2022-08-31 DIAGNOSIS — I25118 Atherosclerotic heart disease of native coronary artery with other forms of angina pectoris: Secondary | ICD-10-CM | POA: Diagnosis not present

## 2022-08-31 DIAGNOSIS — E782 Mixed hyperlipidemia: Secondary | ICD-10-CM

## 2022-08-31 DIAGNOSIS — Z79899 Other long term (current) drug therapy: Secondary | ICD-10-CM | POA: Diagnosis not present

## 2022-08-31 DIAGNOSIS — Z8673 Personal history of transient ischemic attack (TIA), and cerebral infarction without residual deficits: Secondary | ICD-10-CM

## 2022-08-31 DIAGNOSIS — I350 Nonrheumatic aortic (valve) stenosis: Secondary | ICD-10-CM

## 2022-08-31 DIAGNOSIS — N1832 Chronic kidney disease, stage 3b: Secondary | ICD-10-CM | POA: Diagnosis not present

## 2022-08-31 DIAGNOSIS — I739 Peripheral vascular disease, unspecified: Secondary | ICD-10-CM

## 2022-08-31 LAB — CUP PACEART REMOTE DEVICE CHECK
Date Time Interrogation Session: 20240827031213
Implantable Pulse Generator Implant Date: 20240619

## 2022-08-31 MED ORDER — POTASSIUM CHLORIDE CRYS ER 10 MEQ PO TBCR
10.0000 meq | EXTENDED_RELEASE_TABLET | ORAL | 3 refills | Status: DC
Start: 2022-08-31 — End: 2022-09-22

## 2022-08-31 NOTE — Patient Instructions (Signed)
Medication Instructions:  Your physician recommends that you continue on your current medications as directed. Please refer to the Current Medication list given to you today.  *If you need a refill on your cardiac medications before your next appointment, please call your pharmacy*   Lab Work: Your provider would like for you to have following labs drawn today BMP.   If you have labs (blood work) drawn today and your tests are completely normal, you will receive your results only by: MyChart Message (if you have MyChart) OR A paper copy in the mail If you have any lab test that is abnormal or we need to change your treatment, we will call you to review the results.   Testing/Procedures: none   Follow-Up: At [facility], you and your health needs are our priority.  As part of our continuing mission to provide you with exceptional heart care, we have created designated Provider Care Teams.  These Care Teams include your primary Cardiologist (physician) and Advanced Practice Providers (APPs -  Physician Assistants and Nurse Practitioners) who all work together to provide you with the care you need, when you need it.  We recommend signing up for the patient portal called "MyChart".  Sign up information is provided on this After Visit Summary.  MyChart is used to connect with patients for Virtual Visits (Telemedicine).  Patients are able to view lab/test results, encounter notes, upcoming appointments, etc.  Non-urgent messages can be sent to your provider as well.   To learn more about what you can do with MyChart, go to [email or web address].    Your next appointment:   3 month(s)  Provider:   Julien Nordmann, MD or Charlsie Quest, NP

## 2022-08-31 NOTE — Progress Notes (Unsigned)
Cardiology Office Note:  .   Date:  08/31/2022  ID:  Gabrielle Schultz, DOB [birth year], MRN [id number] PCP: Sallyanne Kuster, NP  [facility] Providers Cardiologist:  Julien Nordmann, MD Electrophysiologist:  Lanier Prude, MD { Click to update primary MD,subspecialty MD or APP then REFRESH:1}   History of Present Illness: .   Gabrielle Schultz is a 52 y.o. female with a past medical history of hypertension, recurrent strokes, CKD, PVD, tobacco use, aortic stenosis, who is being seen today for follow-up.  She was seen in 2013 with right-sided facial tingling and numbness event of severe hypertension.  Exercise stress test in 2017 was normal with no evidence of ischemia, mild impaired exercise capacity.  Echocardiogram in 2018 showed normal LVEF, severe AI, moderate MR, and moderate TR.  Prior echocardiogram showed moderate aortic valve regurgitation.  TEE in November 2021 for stroke showed type III diastolic dysfunction, moderate MR, no mobile elements, possible endocarditis versus healed vegetations, moderate aortic regurgitation, mild RV function, trivial MR, and negative bubble study.  Echo 08/2021 showed LVEF of 60 to 65%, no WMA, mild LVH, G1 DD, mildly dilated LA, moderate AI.  Heart monitor on discharge is sinus rhythm without A-fib noted.  Lexiscan Myoview showed no significant ischemia, wall motion was normal with trace aortic atherosclerosis was considered low risk scan.  Echo in January 2024 revealed EF 60 to 65%, mild LVH, mild to moderate MR, moderate to severe AS.  She was referred to the structural heart team and the plan was for aortic valve intervention.  Workup she underwent TEE and the plan was for coronary angiography, CTA and cardiothoracic surgical consult.  She underwent right and left heart catheterization 08/10/2022.  Patient was last seen in clinic 07/24/2022 by Dr. Mariah Milling.  She had recently had hospitalization back in April for recurrent stroke and in January.  She been  evaluated by EP and had a transesophageal echo performed as well.  Recommendation was made for aortic valve replacement given stenosis and regurgitation.  She had an ILR that was in place but had not revealed any atrial fibrillation.  Referral to CT surgery remain in place.  She was sent for right and left catheterization.  She returns to clinic today  ROS: 10 point review of systems has been reviewed and considered negative with exception what is been listed in the HPI  Studies Reviewed: Marland Kitchen   EKG Interpretation Date/Time:  Tuesday August 31 2022 10:51:01 EDT Ventricular Rate:  52 PR Interval:  180 QRS Duration:  118 QT Interval:  442 QTC Calculation: 411 R Axis:   -11  Text Interpretation: Sinus bradycardia Left ventricular hypertrophy Left axis deviation When compared with ECG of 03-Aug-2022 14:51, T wave inversion now evident in Inferior leads Confirmed by Charlsie Quest ([id number]) on 08/31/2022 10:55:24 AM   R/LHC 08/10/22 Conclusions: Significant two-vessel coronary artery with 70% mid LAD stenosis and sequential 50% and 80% proximal/mid RCA lesions. Moderately elevated left and right heart filling pressures. Moderate pulmonary hypertension. Normal/supranormal Fick cardiac output/index. No significant aortic valve gradient. Small right radial artery, not able to be used for catheterization. Moderate stenosis of right common femoral artery.   Recommendations: Increase furosemide to 40 mg daily. Aggressive secondary prevention of ASCVD. Proceed with cardiac surgery consultation for aortic valve surgery and possible CABG.  TEE 03/17/22 1. Left ventricular ejection fraction, by estimation, is 60 to 65%. The  left ventricle has normal function. The left ventricle has no regional  wall motion abnormalities. Left  ventricular diastolic parameters are  consistent with Grade II diastolic  dysfunction (pseudonormalization). Elevated left atrial pressure.   2. Right ventricular systolic  function is normal. The right ventricular  size is normal.   3. Left atrial size was moderately dilated. No left atrial/left atrial  appendage thrombus was detected.   4. The mitral valve is rheumatic. Mild to moderate mitral valve  regurgitation.   5. The aortic valve is trileaflet with commisural thickening and  nodularity suggestive of post-inflammatory or rheumatic changes. 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 There is near-holodiastolic reversal of flow in  the descending aorta. 3D guided ERO  area is 0.4 cm sq. The AR vena contracta is 6 mm. The AR pressure half  time is 286 ms. All these values are at least borderline for severe AR.Marland Kitchen  The aortic valve is tricuspid. There is mild calcification of the aortic  valve. There is moderate thickening  of the aortic valve. Aortic valve regurgitation is moderate to severe.  Mild aortic valve stenosis. Aortic valve mean gradient measures 11.0 mmHg.  Aortic valve Vmax measures 2.34 m/s. Aortic valve acceleration time  measures 66 msec.   6. Agitated saline contrast bubble study was negative, with no evidence  of any interatrial shunt.   TTE 01/10/22 1. Left ventricular ejection fraction, by estimation, is 60 to 65%. The  left ventricle has normal function. The left ventricle has no regional  wall motion abnormalities. The left ventricular internal cavity size was  mildly dilated. There is mild  concentric left ventricular hypertrophy. Left ventricular diastolic  parameters are indeterminate. The average left ventricular global  longitudinal strain is -18.8 %. The global longitudinal strain is normal.   2. Right ventricular systolic function is normal. The right ventricular  size is normal. Tricuspid regurgitation signal is inadequate for assessing  PA pressure.   3. The mitral valve is degenerative. Mild to moderate mitral valve  regurgitation. No evidence of mitral stenosis.   4. The aortic valve has an indeterminant number of cusps.  There is  moderate calcification of the aortic valve. There is moderate thickening  of the aortic valve. Aortic valve regurgitation is moderate to severe.  Mild to moderate aortic valve stenosis.  Aortic regurgitation PHT measures 381 msec. Aortic valve area, by VTI  measures 1.47 cm. Aortic valve mean gradient measures 16.6 mmHg. Aortic  valve Vmax measures 3.03 m/s.   5. The inferior vena cava is normal in size with greater than 50%  respiratory variability, suggesting right atrial pressure of 3 mmHg.   6. Consider further workup with TEE to assess AI further.   Risk Assessment/Calculations:             Physical Exam:   VS:  BP (!) 130/52 (BP Location: Left Arm, Patient Position: Sitting, Cuff Size: Normal)   Pulse (!) 52   Ht 5\' 1"  (1.549 m)   Wt 161 lb (73 kg)   LMP 08/18/2020 (Approximate) Comment: Tubal ligation  SpO2 99%   BMI 30.42 kg/m    Wt Readings from Last 3 Encounters:  08/31/22 161 lb (73 kg)  08/30/22 163 lb 3.2 oz (74 kg)  08/10/22 163 lb (73.9 kg)    GEN: Well nourished, well developed in no acute distress NECK: No JVD; No carotid bruits CARDIAC: ***RRR, II/VI SEM without rubs or gallops RESPIRATORY:  Clear to auscultation without rales, wheezing or rhonchi  ABDOMEN: Soft, non-tender, non-distended EXTREMITIES:  No edema; No deformity   ASSESSMENT AND PLAN: .   ***    {  Are you ordering a CV Procedure (e.g. stress test, cath, DCCV, TEE, etc)?   Press F2        :[id number]}  Dispo: ***  Signed, Legacie Dillingham, NP

## 2022-09-01 ENCOUNTER — Other Ambulatory Visit: Payer: Self-pay

## 2022-09-01 DIAGNOSIS — N1832 Chronic kidney disease, stage 3b: Secondary | ICD-10-CM

## 2022-09-01 DIAGNOSIS — Z79899 Other long term (current) drug therapy: Secondary | ICD-10-CM

## 2022-09-01 LAB — BASIC METABOLIC PANEL
BUN/Creatinine Ratio: 15 (ref 9–23)
BUN: 39 mg/dL — ABNORMAL HIGH (ref 6–24)
CO2: 19 mmol/L — ABNORMAL LOW (ref 20–29)
Calcium: 9.7 mg/dL (ref 8.7–10.2)
Chloride: 105 mmol/L (ref 96–106)
Creatinine, Ser: 2.52 mg/dL — ABNORMAL HIGH (ref 0.57–1.00)
Glucose: 84 mg/dL (ref 70–99)
Potassium: 4.5 mmol/L (ref 3.5–5.2)
Sodium: 140 mmol/L (ref 134–144)
eGFR: 23 mL/min/{1.73_m2} — ABNORMAL LOW (ref >59)

## 2022-09-01 MED ORDER — FUROSEMIDE 40 MG PO TABS: ORAL_TABLET | ORAL | Status: DC

## 2022-09-01 NOTE — Progress Notes (Signed)
Kidney function has worsened with increase in furosemide since last procedure.  Reduce Lasix back to every other day versus daily and recheck a BMP in 1 week.  Ensure patient's nephrologist has last BMP results.

## 2022-09-08 ENCOUNTER — Other Ambulatory Visit: Payer: Medicaid Other | Admitting: Obstetrics and Gynecology

## 2022-09-08 ENCOUNTER — Institutional Professional Consult (permissible substitution): Payer: Medicaid Other | Admitting: Surgery

## 2022-09-08 ENCOUNTER — Encounter: Payer: Self-pay | Admitting: Surgery

## 2022-09-08 VITALS — BP 162/85 | HR 68 | Resp 20 | Ht 61.0 in | Wt 163.0 lb

## 2022-09-08 DIAGNOSIS — I359 Nonrheumatic aortic valve disorder, unspecified: Secondary | ICD-10-CM

## 2022-09-08 DIAGNOSIS — I2510 Atherosclerotic heart disease of native coronary artery without angina pectoris: Secondary | ICD-10-CM | POA: Diagnosis not present

## 2022-09-08 NOTE — Progress Notes (Signed)
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  She had a TEE performed on 03/17/2022 showing an AR vena contract of 6 mm with an AI pressure half-time of 286 ms and ERO area of 0.4 cm consistent with severe aortic insufficiency.  The aortic valve is tricuspid with mild calcification and moderate thickening and lack of central coaptation.  There is near holodiastolic reversal flow in the descending aorta.  The mean aortic valve gradient was 11 mmHg.  The mitral valve appeared rheumatic with mild thickening of the leaflets and mild to moderate central regurgitation.  Mean gradient was 1 mmHg.  Left ventricular ejection fraction was 60 to 65%.  There is no LV dilatation.  There is grade 2 diastolic dysfunction.  She underwent cardiac catheterization on 08/10/2022 showing significant two-vessel coronary disease with 70% mid LAD stenosis and a sequential 5080% proximal and mid RCA stenoses.  There is moderate elevation of left and right heart filling pressures with a PA pressure of 49/25 and a mean of 33.  Wedge pressure was 28.  Right atrial pressure was 12.  Cardiac index was 4.  The patient is here today with her husband.  She has a 60 year old child and a 93-year-old child.  She denies any chest pain or pressure.  She has had some exertional shortness of breath and fatigue.  She denies peripheral edema.   Past Medical History:  Diagnosis Date   Acid reflux    Allergic rhinitis 06/26/2015   Allergy    Asthma    Carpal tunnel syndrome    right hand   Chronic kidney disease     Colitis    CVA (cerebral infarction)    2009, 2015   Gout    History of syncope    Hypertension 2008   Low HDL (under 40) 06/26/2015   MI (myocardial infarction) (HCC)    2015 - patient wasnt told by cardiologist that she had MI but has had cardiac cath in past   Migraines    Poor circulation    Renal insufficiency    Stroke [facility])    Syncope and collapse     Past Surgical History:  Procedure Laterality Date   BUBBLE STUDY  12/03/2019   Procedure: BUBBLE STUDY;  Surgeon: Sande Rives, MD;  Location: [facility] ENDOSCOPY;  Service: Cardiovascular;;   BUBBLE STUDY  03/17/2022   Procedure: BUBBLE STUDY;  Surgeon: Thurmon Fair, MD;  Location: [facility] ENDOSCOPY;  Service: Cardiovascular;;   CARDIAC CATHETERIZATION     CESAREAN SECTION  1994   Dr. Arvil Chaco   COLONOSCOPY WITH PROPOFOL N/A 06/26/2014   Procedure: COLONOSCOPY WITH PROPOFOL;  Surgeon: Elnita Maxwell, MD;  Location: [facility] ENDOSCOPY;  Service: Endoscopy;  Laterality: N/A;   DILATION AND CURETTAGE OF UTERUS  x2 for incomplete SABs   RIGHT/LEFT HEART CATH AND CORONARY ANGIOGRAPHY Bilateral 08/10/2022   Procedure: RIGHT/LEFT HEART CATH AND CORONARY ANGIOGRAPHY;  Surgeon: Yvonne Kendall, MD;  Location: [facility] INVASIVE CV LAB;  Service: Cardiovascular;  Laterality: Bilateral;   TEE WITHOUT CARDIOVERSION N/A 12/03/2019   Procedure: TRANSESOPHAGEAL ECHOCARDIOGRAM (TEE);  Surgeon: Sande Rives, MD;  Location: [facility] ENDOSCOPY;  Service: Cardiovascular;  Laterality: N/A;   TEE WITHOUT CARDIOVERSION N/A 03/17/2022   Procedure: TRANSESOPHAGEAL ECHOCARDIOGRAM (TEE);  Surgeon: Thurmon Fair, MD;  Location: [facility] ENDOSCOPY;  Service: Cardiovascular;  Laterality: N/A;    Family History  Problem Relation Age of Onset   Hyperlipidemia Mother    Heart disease Mother        has a defibrillator   COPD Mother    Hypertension Father    Heart disease Father    Hyperlipidemia Father    Hypertension Sister        died in her 30s from  heart failure and end stage kidney disease   Kidney disease Sister    Hypertension Brother    Heart disease Brother        has a defibrillator   Coronary artery disease Brother        had CABG   Hyperlipidemia Brother    Healthy Child    Diabetes Child     Social History Social History   Tobacco Use   Smoking status: Every Day    Current packs/day: 0.50    Average packs/day: 0.5 packs/day for 33.1 years (16.6 ttl pk-yrs)    Types: Cigarettes    Start date: 03/04/2017   Smokeless tobacco: Never  Vaping Use   Vaping status: Never Used  Substance Use Topics   Alcohol use: Not Currently    Comment: rare- drinks an occassional wine cooler   Drug use: No    Current Outpatient Medications  Medication Sig Dispense Refill   acetaminophen (TYLENOL) 650 MG CR tablet Take 650-1,300 mg by mouth every 8 (eight) hours as needed for pain.     albuterol (PROVENTIL HFA) 108 (90 Base) MCG/ACT inhaler Inhale 2 puffs into the lungs once every 4 (four) hours as needed for wheezing (cough). 18 g 5   allopurinol (ZYLOPRIM) 100 MG tablet TAKE ONE TABLET BY MOUTH ONCE DAILY. 90 tablet 3   amLODipine (NORVASC) 5 MG tablet Take 1 tablet (5 mg total) by mouth daily. 90 tablet 1   aspirin 81 MG chewable tablet Chew 1 tablet (81 mg total) by mouth daily. 90 tablet 0   atorvastatin (LIPITOR) 80 MG tablet Take 1 tablet (80 mg total) by mouth at bedtime. 90 tablet 1   cetirizine (ZYRTEC) 10 MG tablet Take 1 tablet (10 mg total) by mouth daily. 90 tablet 1   cloNIDine (CATAPRES) 0.3 MG tablet TAKE 1 TABLET BY MOUTH THREE TIMES DAILY(MORNING, NOON AND EVENING) 270 tablet 0   clopidogrel (PLAVIX) 75 MG tablet Take 75 mg by mouth daily.     docusate sodium (COLACE) 100 MG capsule Take 100 mg by mouth in the morning.     fluconazole (DIFLUCAN) 150 MG tablet TAKE 1 TABLET(150 MG) BY MOUTH 1 TIME FOR 1 DOSE. MAY TAKE AN ADDITIONAL DOSE AFTER 3 DAYS IF STILL SYMPTOMATIC 3 tablet 0   fluticasone (FLONASE) 50 MCG/ACT  nasal spray USE 1 SPRAY INTO BOTH NOSTRILS 2 TIMES A DAY (Patient taking differently: Place 1 spray into both nostrils at bedtime. USE 1 SPRAY INTO BOTH NOSTRILS 2 TIMES A DAY) 16 g 0   fluticasone-salmeterol (WIXELA INHUB) 250-50 MCG/ACT AEPB Inhale one puff into the lungs in the morning and at bedtime 60 each 11   furosemide (LASIX) 40 MG tablet Take 40 mg oral 5 times a week Monday thru Schultz (Patient taking differently: Take 40 mg by mouth daily. Take 40 mg oral daily)  losartan (COZAAR) 50 MG tablet Take 25 mg by mouth daily.     nystatin (MYCOSTATIN/NYSTOP) powder Apply 1 Application topically 3 (three) times daily. 30 g 5   pantoprazole (PROTONIX) 40 MG tablet Take 1 tablet (40 mg total) by mouth 2 (two) times daily before a meal. 180 tablet 2   potassium chloride (KLOR-CON M) 10 MEQ tablet Take 1 tablet (10 mEq total) by mouth every other day. To take whenever you take lasix 45 tablet 3   promethazine (PHENERGAN) 25 MG tablet Take 1 tablet (25 mg total) by mouth as needed for nausea or vomiting. 30 tablet 2   Ubrogepant (UBRELVY) 100 MG TABS Take 1 tablet (100 mg total) by mouth as needed. May repeat after 2 hours.  Maximum 2 tablets in 24 hours. 10 tablet 11   No current facility-administered medications for this visit.    Allergies  Allergen Reactions   Coconut (Cocos Nucifera) Hives and Swelling    Just coconut ("all coconut")   Amoxicillin Other (See Comments)    "makes me sick" "swelled up everywhere" Has patient had a PCN reaction causing immediate rash, facial/tongue/throat swelling, SOB or lightheadedness with hypotension: No Has patient had a PCN reaction causing severe rash involving mucus membranes or skin necrosis: No Has patient had a PCN reaction that required hospitalization: No Has patient had a PCN reaction occurring within the last 10 years: No If all of the above answers are "NO", then may proceed with Cephalosporin use.     Penicillins Other (See Comments)     "makes me sick" "swelled up oil" Has patient had a PCN reaction causing immediate rash, facial/tongue/throat swelling, SOB or lightheadedness with hypotension: No Has patient had a PCN reaction causing severe rash involving mucus membranes or skin necrosis: No Has patient had a PCN reaction that required hospitalization: No Has patient had a PCN reaction occurring within the last 10 years: Yes If all of the above answers are "NO", then may proceed with Cephalosporin use.    Singulair [Montelukast Sodium] Rash    "made heart race"    Review of Systems  Constitutional:  Positive for fatigue. Negative for activity change, chills and fever.  HENT: Negative.  Negative for dental problem.   Eyes: Negative.   Respiratory:  Positive for shortness of breath.   Gastrointestinal: Negative.   Endocrine: Negative.   Genitourinary: Negative.   Musculoskeletal: Negative.   Skin: Negative.   Allergic/Immunologic: Negative.   Neurological:  Negative for dizziness and syncope.  Psychiatric/Behavioral: Negative.      BP (!) 162/85 (BP Location: Right Arm, Patient Position: Sitting)   Pulse 68   Resp 20   Ht 5\' 1"  (1.549 m)   Wt 163 lb (73.9 kg)   LMP 08/18/2020 (Approximate) Comment: Tubal ligation  SpO2 98% Comment: RA  BMI 30.80 kg/m  Physical Exam Constitutional:      Appearance: Normal appearance. She is obese.  HENT:     Head: Normocephalic and atraumatic.     Mouth/Throat:     Mouth: Mucous membranes are moist.     Pharynx: Oropharynx is clear.  Eyes:     Extraocular Movements: Extraocular movements intact.     Conjunctiva/sclera: Conjunctivae normal.     Pupils: Pupils are equal, round, and reactive to light.  Neck:     Vascular: No carotid bruit.  Cardiovascular:     Rate and Rhythm: Normal rate and regular rhythm.     Pulses: Normal pulses.     Heart  sounds: Murmur heard.     Comments: 2/6 diastolic murmur along the left lower sternal border Pulmonary:     Effort:  Pulmonary effort is normal.     Breath sounds: Normal breath sounds.  Abdominal:     General: Bowel sounds are normal. There is no distension.     Tenderness: There is no abdominal tenderness.  Musculoskeletal:        General: No swelling.     Cervical back: Normal range of motion.  Skin:    General: Skin is dry.  Neurological:     General: No focal deficit present.     Mental Status: She is alert and oriented to person, place, and time.  Psychiatric:        Mood and Affect: Mood normal.        Behavior: Behavior normal.      Diagnostic Tests:      TRANSESOPHOGEAL ECHO REPORT       Patient Name:   Gabrielle Schultz Date of Exam: 03/17/2022  Medical Rec #:  [id number]      Height:       61.0 in  Accession #:    [id number]     Weight:       162.0 lb  Date of Birth:  1970-03-02     BSA:          1.727 m  Patient Age:    51 years       BP:           124/90 mmHg  Patient Gender: F              HR:           72 bpm.  Exam Location:  Inpatient   Procedure: Transesophageal Echo, 3D Echo, Color Doppler and Cardiac  Doppler   Indications:   I35.2 Nonrheumatic aortic (valve) stenosis with  insufficiency    History:       Patient has prior history of Echocardiogram examinations,  most                 recent 01/10/2022. COPD; Risk Factors:Hypertension and                  Dyslipidemia.    Sonographer:    Irving Burton Senior RDCS  Referring Phys: [id number] Orbie Pyo   PROCEDURE: After discussion of the risks and benefits of a TEE, an  informed consent was obtained from the patient. The transesophogeal probe  was passed without difficulty through the esophogus of the patient.  Sedation performed by different physician.  The patient was monitored while under deep sedation. Anesthestetic  sedation was provided intravenously by Anesthesiology: 86mg  of Propofol,  100mg  of Lidocaine. The patient developed no complications during the  procedure.    IMPRESSIONS     1. Left ventricular  ejection fraction, by estimation, is 60 to 65%. The  left ventricle has normal function. The left ventricle has no regional  wall motion abnormalities. Left ventricular diastolic parameters are  consistent with Grade II diastolic  dysfunction (pseudonormalization). Elevated left atrial pressure.   2. Right ventricular systolic function is normal. The right ventricular  size is normal.   3. Left atrial size was moderately dilated. No left atrial/left atrial  appendage thrombus was detected.   4. The mitral valve is rheumatic. Mild to moderate mitral valve  regurgitation.   5. The aortic valve is trileaflet with commisural thickening and  nodularity suggestive of post-inflammatory or rheumatic  changes. 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 There is near-holodiastolic reversal of flow in  the descending aorta. 3D guided ERO  area is 0.4 cm sq. The AR vena contracta is 6 mm. The AR pressure half  time is 286 ms. All these values are at least borderline for severe AR.Marland Kitchen  The aortic valve is tricuspid. There is mild calcification of the aortic  valve. There is moderate thickening  of the aortic valve. Aortic valve regurgitation is moderate to severe.  Mild aortic valve stenosis. Aortic valve mean gradient measures 11.0 mmHg.  Aortic valve Vmax measures 2.34 m/s. Aortic valve acceleration time  measures 66 msec.   6. Agitated saline contrast bubble study was negative, with no evidence  of any interatrial shunt.   FINDINGS   Left Ventricle: Left ventricular ejection fraction, by estimation, is 60  to 65%. The left ventricle has normal function. The left ventricle has no  regional wall motion abnormalities. The left ventricular internal cavity  size was normal in size. Left  ventricular diastolic parameters are consistent with Grade II diastolic  dysfunction (pseudonormalization).   Right Ventricle: The right ventricular size is normal. No increase in  right ventricular wall thickness. Right  ventricular systolic function is  normal.   Left Atrium: Left atrial size was moderately dilated. No left atrial/left  atrial appendage thrombus was detected.   Right Atrium: Right atrial size was normal in size.   Pericardium: There is no evidence of pericardial effusion.   Mitral Valve: The mitral valve is rheumatic. There is mild thickening of  the mitral valve leaflet(s). Mild to moderate mitral valve regurgitation,  with centrally-directed jet. MV peak gradient, 1.3 mmHg. The mean mitral  valve gradient is 1.0 mmHg.   Tricuspid Valve: The tricuspid valve is normal in structure. Tricuspid  valve regurgitation is mild.   Aortic Valve: The aortic valve is trileaflet with commisural thickening  and nodularity suggestive of post-inflammatory or rheumatic changes.  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 There is near-holodiastolic reversal  of flow in the descending aorta. 3D  guided ERO area is 0.4 cm sq. The AR vena contracta is 6 mm. The AR  pressure half time is 286 ms. All these values are at least borderline for  severe AR. The aortic valve is tricuspid. There is mild calcification of  the aortic valve. There is moderate  thickening of the aortic valve. Aortic valve regurgitation is moderate to  severe. Aortic regurgitation PHT measures 341 msec. Mild aortic stenosis  is present. Aortic valve mean gradient measures 11.0 mmHg. Aortic valve  peak gradient measures 21.9 mmHg.  Aortic valve area, by VTI measures 1.38 cm.   Pulmonic Valve: The pulmonic valve was normal in structure. Pulmonic valve  regurgitation is not visualized.   Aorta: The aortic root, ascending aorta and aortic arch are all  structurally normal, with no evidence of dilitation or obstruction.   IAS/Shunts: No atrial level shunt detected by color flow Doppler. Agitated  saline contrast bubble study was negative, with no evidence of any  interatrial shunt.   Additional Comments: Spectral Doppler performed.    LEFT VENTRICLE  PLAX 2D  LVOT diam:     2.10 cm  LV SV:         74  LV SV Index:   43  LVOT Area:     3.46 cm     AORTIC VALVE  AV Area (Vmax):    1.51 cm  AV Area (Vmean):   1.52 cm  AV Area (VTI):  1.38 cm  AV Vmax:           234.00 cm/s  AV Vmean:          154.000 cm/s  AV VTI:            0.535 m  AV Peak Grad:      21.9 mmHg  AV Mean Grad:      11.0 mmHg  LVOT Vmax:         102.00 cm/s  LVOT Vmean:        67.400 cm/s  LVOT VTI:          0.213 m  LVOT/AV VTI ratio: 0.40  AI PHT:            286 msec   MITRAL VALVE             TRICUSPID VALVE  MV Area VTI:  5.38 cm   TR Peak grad:   20.2 mmHg  MV Peak grad: 1.3 mmHg   TR Vmax:        225.00 cm/s  MV Mean grad: 1.0 mmHg  MV Vmax:      0.58 m/s   SHUNTS  MV Vmean:     33.8 cm/s  Systemic VTI:  0.21 m                           Systemic Diam: 2.10 cm   Rachelle Hora Croitoru MD  Electronically signed by Thurmon Fair MD  Signature Date/Time: 03/17/2022/3:23:19 PM        Final      Physicians  Panel Physicians Referring Physician Case Authorizing Physician  End, Cristal Deer, MD (Primary)     Procedures  RIGHT/LEFT HEART CATH AND CORONARY ANGIOGRAPHY   Conclusion  Conclusions: Significant two-vessel coronary artery with 70% mid LAD stenosis and sequential 50% and 80% proximal/mid RCA lesions. Moderately elevated left and right heart filling pressures. Moderate pulmonary hypertension. Normal/supranormal Fick cardiac output/index. No significant aortic valve gradient. Small right radial artery, not able to be used for catheterization. Moderate stenosis of right common femoral artery.   Recommendations: Increase furosemide to 40 mg daily. Aggressive secondary prevention of ASCVD. Proceed with cardiac surgery consultation for aortic valve surgery and possible CABG.   Yvonne Kendall, MD [facility]     Recommendations  Antiplatelet/Anticoag Continue aspirin and clopidogrel with history of strokes.   Discharge Date In the absence of any other complications or medical issues, we expect the patient to be ready for discharge from a cath perspective on 08/10/2022.   Indications  Aortic valve disease [I35.9 (ICD-10-CM)]  Aortic valve stenosis, etiology of cardiac valve disease unspecified [I35.0 (ICD-10-CM)]   Clinical Presentation  CHF/Shock Congestive heart failure present. NYHA Class III. No shock present.   Procedural Details  Technical Details Indication: 52 y.o. year-old woman with history of aortic valve disease (severe regurgitation and possible mild stenosis), recurrent strokes, hypertension, and chronic kidney disease stage 4, presenting for further evaluation of aortic valve disease in anticipation of surgical valve intervention.  GFR: 32 ml/min  Procedure: The risks, benefits, complications, treatment options, and expected outcomes were discussed with the patient. The patient and/or family concurred with the proposed plan, giving informed consent. The right wrist and elbow were prepped and draped in a sterile fashion. 1% lidocaine was used for local anesthesia. A previously placed antecubital vein IV was exchanged for a 69F slender Glidesheath using modified Seldinger technique. Right heart catheterization was performed by advancing a  532F balloon-tipped catheter through the right heart chambers into the pulmonary capillary wedge position. Pressure measurements and oxygen saturations were obtained.  Ultrasound was used to evaluate the right radial artery. It was small but patent.  A micropuncture needle was used to access the right radial under ultrasound guidance two twice.  However, the micropuncture wire could not be advanced.  Decision was therefore made to switch to femoral access.  The right groin was already prepped and draped in sterile fashion.  1% lidocaine was used for local anesthesia.  Ultrasound was used to evaluate the right common femoral artery. It was calcified but  patent.  A micropuncture needle was used to access the right common femoral artery under ultrasound guidance.  Angiogram through the micropuncture sheath showed diffuse calcification of the external iliac and common femoral arteries with 50-60% stenosis just proximal to the bifurcation. Selective coronary angiography was performed using a 532F JL5 catheter to engage the left coronary artery and a 532F JR4 catheter to engage the right coronary artery. Left heart catheterization was performed using a 532F JR4 catheter. Left ventriculogram was not performed.  At the end of the procedure, the right common femoral artery sheath was removed and hemostasis achieved with manual compression.  The right antecubital vein sheath was removed and hemostasis achieved with manual compression. The patient was taken to the recovery area in stable condition.   Estimated blood loss <50 mL.   During this procedure medications were administered to achieve and maintain moderate conscious sedation while the patient's heart rate, blood pressure, and oxygen saturation were continuously monitored and I was present face-to-face 100% of this time.   Medications (Filter: Administrations occurring from 1030 to 1210 on 08/10/22)  important  Continuous medications are totaled by the amount administered until 08/10/22 1210.   Heparin (Porcine) in NaCl 2000-0.9 UNIT/L-% SOLN (mL)  Total volume: 1,000 mL Date/Time Rate/Dose/Volume Action   08/10/22 1054 1,000 mL Given   fentaNYL (SUBLIMAZE) injection (mcg)  Total dose: 50 mcg Date/Time Rate/Dose/Volume Action   08/10/22 1054 25 mcg Given   1118 25 mcg Given   midazolam (VERSED) injection (mg)  Total dose: 2 mg Date/Time Rate/Dose/Volume Action   08/10/22 1055 1 mg Given   1118 1 mg Given   iohexol (OMNIPAQUE) 300 MG/ML solution (mL)  Total volume: 27 mL Date/Time Rate/Dose/Volume Action   08/10/22 1137 27 mL Given   hydrALAZINE (APRESOLINE) injection (mg)  Total dose: 10  mg Date/Time Rate/Dose/Volume Action   08/10/22 1140 10 mg Given   lidocaine (PF) (XYLOCAINE) 1 % injection (mL)  Total volume: 30 mL Date/Time Rate/Dose/Volume Action   08/10/22 1208 30 mL Given    Sedation Time  Sedation Time Physician-1: 42 minutes 53 seconds Contrast     Administrations occurring from 1030 to 1210 on 08/10/22:  Medication Name Total Dose  iohexol (OMNIPAQUE) 300 MG/ML solution 27 mL   Radiation/Fluoro  Fluoro time: 5.3 (min) DAP: 19.2 (Gycm2) Cumulative Air Kerma: 247 (mGy) Complications  Complications documented before study signed (08/10/2022 12:13 PM)   No complications were associated with this study.  Documented by Kizzie Furnish, RT - 08/10/2022 11:40 AM     Coronary Findings  Diagnostic Dominance: Right Left Main  Vessel is large. Vessel is angiographically normal.    Left Anterior Descending  Vessel is large.  Mid LAD lesion is 70% stenosed.    First Diagonal Branch  Vessel is large in size. There is mild disease in the vessel.    Lateral First  Diagonal Branch  Vessel is small in size.    Second Diagonal Branch  Vessel is small in size.    Third Diagonal Branch  Vessel is small in size.    Left Circumflex  Vessel is large. The vessel exhibits minimal luminal irregularities.    First Obtuse Marginal Branch  Vessel is small in size.    Second Obtuse Marginal Branch  Vessel is large in size.    Third Obtuse Marginal Branch  Vessel is small in size.    Right Coronary Artery  Vessel is moderate in size.  Prox RCA-1 lesion is 50% stenosed.  Prox RCA-2 lesion is 80% stenosed.    Right Posterior Descending Artery  Vessel is moderate in size.    Right Posterior Atrioventricular Artery  Vessel is moderate in size.    First Right Posterolateral Branch  Vessel is small in size.    Second Right Posterolateral Branch  Vessel is small in size.    Third Right Posterolateral Branch  Vessel is moderate in size.     Intervention   No interventions have been documented.   Right Heart  Right Heart Pressures RA (mean): 12 mmHg RV (S/EDP): 53/13 mmHg PA (S/D, mean): 49/25 (33) mmHg PCWP (mean): 28 mmHg  Ao sat: 91% PA sat: 67%  Fick CO: 6.9 L/min Fick CI: 4.0 L/min/m^2   Left Heart  Left Ventricle LV end diastolic pressure is moderately elevated. LVEDP 28 mmHg.  Aortic Valve There is no aortic valve stenosis.   Coronary Diagrams  Diagnostic Dominance: Right  Intervention   Implants   No implant documentation for this case.   Syngo Images   Show images for CARDIAC CATHETERIZATION Images on Long Term Storage   Show images for Michelena, Sibayan to Procedure Log  Procedure Log   Link to Procedure Log  Procedure Log    Hemo Data  Flowsheet Row Most Recent Value  Fick Cardiac Output 6.91 L/min  Fick Cardiac Output Index 3.99 (L/min)/BSA  Aortic Mean Gradient 13.33 mmHg  Aortic Peak Gradient 0 mmHg  Aortic Valve Area 2.02  Aortic Value Area Index 1.17 cm2/BSA  RA A Wave 14 mmHg  RA V Wave 11 mmHg  RA Mean 10 mmHg  RV Systolic Pressure 48 mmHg  RV Diastolic Pressure 2 mmHg  RV EDP 12 mmHg  PA Systolic Pressure 46 mmHg  PA Diastolic Pressure 19 mmHg  PA Mean 28 mmHg  PW A Wave 25 mmHg  PW V Wave 27 mmHg  PW Mean 22 mmHg  AO Systolic Pressure 4 mmHg  AO Diastolic Pressure 1 mmHg  AO Mean 3 mmHg  LV Systolic Pressure 166 mmHg  LV Diastolic Pressure 3 mmHg  LV EDP 17 mmHg  AOp Systolic Pressure 173 mmHg  AOp Diastolic Pressure 53 mmHg  AOp Mean Pressure 92 mmHg  LVp Systolic Pressure 168 mmHg  LVp Diastolic Pressure 4 mmHg  LVp EDP Pressure 28 mmHg  QP/QS 1  TPVR Index 7.01 HRUI  TSVR Index 0.75 HRUI  TPVR/TSVR Ratio 9.33   Impression:  This 52 year old woman has stage D, severe, symptomatic aortic insufficiency with NYHA class II symptoms of exertional fatigue and shortness of breath consistent with chronic diastolic congestive heart failure.  She also  has significant two-vessel coronary disease involving the LAD and RCA.  Her aortic insufficiency is not amenable to TAVR.  I think aortic valve replacement and coronary bypass graft surgery is the best treatment for her.  I discussed the alternatives of  mechanical and bioprosthetic valves with her and her husband.  She is only 8 years old but has a history of colitis, stage IV chronic kidney disease, and multiple strokes which would certainly increase her risk of chronic need for Coumadin anticoagulation.  I think that a bioprosthetic valve would be the best option for her to minimize the risk of anticoagulation related complications.  She is in agreement with that. I discussed the operative procedure with the patient and her husband including alternatives, benefits and risks; including but not limited to bleeding, blood transfusion, infection, stroke, myocardial infarction, graft failure, heart block requiring a permanent pacemaker, organ dysfunction, and death.  Gabrielle Schultz understands and agrees to proceed.    Plan:  She is going to look at her schedule and will call us back to schedule aortic valve replacement using a bioprosthetic valve and coronary artery bypass graft surgery.  I spent 60 minutes performing this consultation and > 50% of this time was spent face to face counseling and coordinating the care of this patient's severe aortic insufficiency and two-vessel coronary disease.  Alleen Borne, MD [facility] [phone]

## 2022-09-08 NOTE — Patient Instructions (Signed)
Hi Gabrielle Schultz, I am sorry I missed you this morning, I hope you are doing okay- as a part of your Medicaid benefit, you are eligible for care management and care coordination services at no cost or copay. I was unable to reach you by phone today but would be happy to help you with your health related needs. Please feel free to call me at [phone].  A member of the Managed Medicaid care management team will reach out to you again over the next 30 business days.   [name], BSN [place]  [facility] - Managed Medicaid High Risk [phone]

## 2022-09-08 NOTE — Patient Outreach (Signed)
  Medicaid Managed Care   Unsuccessful Attempt Note   09/08/2022 Name: Gabrielle Schultz MRN: [id number] DOB: Oct 05, 1970  Referred by: Sallyanne Kuster, NP Reason for referral : High Risk Managed Medicaid (Unsuccessful telephone outreach)  An unsuccessful telephone outreach was attempted today. The patient was referred to the case management team for assistance with care management and care coordination.    Follow Up Plan: The Managed Medicaid care management team will reach out to the patient again over the next 30 business  days. and The  Patient has been provided with contact information for the Managed Medicaid care management team and has been advised to call with any health related questions or concerns.   [name], BSN [place]  [facility] - Managed Medicaid High Risk [phone]

## 2022-09-13 NOTE — Progress Notes (Signed)
Carelink Summary Report / Loop Recorder 

## 2022-09-14 ENCOUNTER — Other Ambulatory Visit: Payer: Medicaid Other | Admitting: Obstetrics and Gynecology

## 2022-09-14 ENCOUNTER — Encounter: Payer: Self-pay | Admitting: Obstetrics and Gynecology

## 2022-09-14 NOTE — Patient Outreach (Signed)
Medicaid Managed Care   Nurse Care Manager Note  09/14/2022 Name:  Gabrielle Schultz MRN:  [id number] DOB:  1970/02/11  Gabrielle Schultz is an 52 y.o. year old female who is a primary patient of Sallyanne Kuster, NP.  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 Grody was given information about Medicaid Managed Care Coordination team services today. Gabrielle Schultz Patient agreed to services and verbal consent obtained.  Engaged with patient by telephone for follow up visit in response to provider referral for case management and/or care coordination services.   Assessments/Interventions:  Review of past medical history, allergies, medications, health status, including review of consultants reports, laboratory and other test data, was performed as part of comprehensive evaluation and provision of chronic care management services.  SDOH (Social Determinants of Health) assessments and interventions performed: SDOH Interventions    Flowsheet Row Patient Outreach Telephone from 09/14/2022 in [place] POPULATION HEALTH DEPARTMENT Patient Outreach Telephone from 08/05/2022 in [place] POPULATION HEALTH DEPARTMENT Patient Outreach Telephone from 06/24/2022 in [place] POPULATION HEALTH DEPARTMENT Patient Outreach Telephone from 05/20/2022 in [place] POPULATION HEALTH DEPARTMENT Telephone from 04/27/2022 in [facility] Care Coordination Patient Outreach Telephone from 04/20/2022 in  POPULATION HEALTH DEPARTMENT  SDOH Interventions        Housing Interventions Intervention Not Indicated -- -- -- -- --  Transportation Interventions -- -- -- -- Other (Comment)  [discussed with pt  transportation available through Medicaid-she does not use them as she is unable to take her 50yr old along the ride with her-states she is able to get family to get her to appts] --  Utilities Interventions -- -- -- -- -- Intervention Not Indicated  Alcohol Usage Interventions -- -- Intervention Not Indicated (Score <7) -- -- --  Financial Strain Interventions -- -- -- Intervention Not Indicated -- --  Physical Activity Interventions -- -- -- Other (Comments)  [patient starting PT] -- --  Stress Interventions -- Intervention Not Indicated -- -- -- Intervention Not Indicated  Health Literacy Interventions -- Intervention Not Indicated -- -- -- --     Care Plan Allergies  Allergen Reactions   Coconut (Cocos Nucifera) Hives and Swelling    Just coconut ("all coconut")   Amoxicillin Other (See Comments)    "makes me sick" "swelled up everywhere" Has patient had a PCN reaction causing immediate rash, facial/tongue/throat swelling, SOB or lightheadedness with hypotension: No Has patient had a PCN reaction causing severe rash involving mucus membranes or skin necrosis: No Has patient had a PCN reaction that required hospitalization: No Has patient had a PCN reaction occurring within the last 10 years: No If all of the above answers are "NO", then may proceed with Cephalosporin use.     Penicillins Other (See Comments)    "makes me sick" "swelled up oil" Has patient had a PCN reaction causing immediate rash, facial/tongue/throat swelling, SOB or lightheadedness with hypotension: No Has patient had a PCN reaction causing severe rash involving mucus membranes or skin necrosis: No Has patient had a PCN reaction that required hospitalization: No Has patient had a PCN reaction occurring within the last 10 years: Yes If all of the above answers are "NO", then may proceed with Cephalosporin use.  Singulair [Montelukast Sodium] Rash    "made heart race"   Medications Reviewed Today     Reviewed  by [name], RN (Registered Nurse) on 09/14/22 at 1438  Med List Status: <None>   Medication Order Taking? Sig Documenting Provider Last Dose Status Informant  acetaminophen (TYLENOL) 650 MG CR tablet [id number] No Take 650-1,300 mg by mouth every 8 (eight) hours as needed for pain. [provider] Taking Active Self, Pharmacy Records  albuterol (PROVENTIL HFA) 108 (90 Base) MCG/ACT inhaler [id number] No Inhale 2 puffs into the lungs once every 4 (four) hours as needed for wheezing (cough). Sallyanne Kuster, NP Taking Active Self, Pharmacy Records  allopurinol (ZYLOPRIM) 100 MG tablet [id number] No TAKE ONE TABLET BY MOUTH ONCE DAILY. Sallyanne Kuster, NP Taking Active Self, Pharmacy Records  amLODipine (NORVASC) 5 MG tablet [id number] No Take 1 tablet (5 mg total) by mouth daily. Sallyanne Kuster, NP Taking Active   aspirin 81 MG chewable tablet [id number] No Chew 1 tablet (81 mg total) by mouth daily. Mathews Argyle, NP Taking Active   atorvastatin (LIPITOR) 80 MG tablet [id number] No Take 1 tablet (80 mg total) by mouth at bedtime. Sallyanne Kuster, NP Taking Active   cetirizine (ZYRTEC) 10 MG tablet [id number] No Take 1 tablet (10 mg total) by mouth daily. Sallyanne Kuster, NP Taking Active   cloNIDine (CATAPRES) 0.3 MG tablet [id number] No TAKE 1 TABLET BY MOUTH THREE TIMES DAILY(MORNING, NOON AND EVENING) Abernathy, Alyssa, NP Taking Active   clopidogrel (PLAVIX) 75 MG tablet [id number] No Take 75 mg by mouth daily. [provider] Taking Active   docusate sodium (COLACE) 100 MG capsule [id number] No Take 100 mg by mouth in the morning. [provider] Taking Active Self, Pharmacy Records  fluconazole (DIFLUCAN) 150 MG tablet [id number] No TAKE 1 TABLET(150 MG) BY MOUTH 1 TIME FOR 1 DOSE. MAY TAKE AN ADDITIONAL DOSE AFTER 3 DAYS IF STILL SYMPTOMATIC Abernathy, Alyssa, NP Taking Active   fluticasone (FLONASE) 50 MCG/ACT nasal spray [id number] No USE 1 SPRAY INTO BOTH  NOSTRILS 2 TIMES A DAY  Patient taking differently: Place 1 spray into both nostrils at bedtime. USE 1 SPRAY INTO BOTH NOSTRILS 2 TIMES A DAY   Wieting, Richard, MD Taking Active Self, Pharmacy Records  fluticasone-salmeterol [facility]) 250-50 MCG/ACT AEPB [id number] No Inhale one puff into the lungs in the morning and at bedtime Sallyanne Kuster, NP Taking Active Self, Pharmacy Records  furosemide (LASIX) 40 MG tablet [id number] No Take 40 mg oral 5 times a week Monday thru Schultz  Patient taking differently: Take 40 mg by mouth daily. Take 40 mg oral daily   Hammock, Sheri, NP Taking Active   losartan (COZAAR) 50 MG tablet [id number] No Take 25 mg by mouth daily. [provider] Taking Active   nystatin (MYCOSTATIN/NYSTOP) powder [id number] No Apply 1 Application topically 3 (three) times daily. Sallyanne Kuster, NP Taking Active   pantoprazole (PROTONIX) 40 MG tablet [id number] No Take 1 tablet (40 mg total) by mouth 2 (two) times daily before a meal. Abernathy, Alyssa, NP Taking Active   potassium chloride (KLOR-CON M) 10 MEQ tablet [id number] No Take 1 tablet (10 mEq total) by mouth every other day. To take whenever you take lasix Charlsie Quest, NP Taking Active   promethazine (PHENERGAN) 25 MG tablet [id number] No Take 1 tablet (25 mg total) by mouth as needed for nausea or vomiting. Sallyanne Kuster, NP Taking Active Self, Pharmacy Records  Ubrogepant (UBRELVY) 100 MG TABS [id number] No  Take 1 tablet (100 mg total) by mouth as needed. May repeat after 2 hours.  Maximum 2 tablets in 24 hours. 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/10/2022   Urge and stress incontinence 08/27/2021   COPD (chronic obstructive pulmonary disease) (HCC)  08/19/2021   History of TIA (transient ischemic attack) 07/22/2020   Hyperlipidemia    History of MI (myocardial infarction)    Migraines    Essential hypertension 12/23/2018   Chronic kidney disease 11/21/2013   Conditions to be addressed/monitored per PCP order:  Chronic healthcare management needs, HLD, LVA, sinusitis, dermatitis, asthma, rhinitis, CKD, GERD, TIA, AVD, tobacco use, gout, h/o multiple strokes, MI, migraines, HTN, COPD, CAD, 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  Care Plan : RN Care Manager Plan of Care  Updates made by [name], RN since 09/14/2022 12:00 AM     Problem: Health Promotion or Disease Self-Management (General Plan of Care)      Long-Range Goal: Chronic Disease Management   Start Date: 03/19/2022  Expected End Date: 12/14/2022  Priority: High  Note:   Current Barriers:  Knowledge Deficits related to plan of care for management of LVA, stroke, MI, migraines, HTN, TIA, AVD, asthma, rhinitis, COPD, sinusitis, dermatitis, CKD, tobacco use, HLD  Chronic Disease Management support and education needs related to LVA, stroke, MI, migraines, HTN, TIA, AVD, asthma, rhinitis, COPD, sinusitis, dermatitis, CKD, tobacco use, HLD  09/14/22:  To schedule CABG and eye appt.  Has dental appt next month.  Does not check BP, smokes 1/2 ppd-no desire to quit at this time.  Some SOB with exertion-Lasix M-F.  Cough unchanged.  RNCM Clinical Goal(s):  Patient will verbalize understanding of plan for management of LVA, stroke, MI, migraines, HTN, TIA, AVD, asthma, rhinitis, COPD, sinusitis, dermatitis, CKD, tobacco use, HLD as evidenced by patient report verbalize basic understanding of LVA, stroke, MI, migraines, HTN, TIA, AVD, asthma, rhinitis, COPD, sinusitis, dermatitis, CKD, tobacco use, HLD   disease process and self health management plan as evidenced by patient report take all medications exactly as prescribed and will call provider for  medication related questions as evidenced by patient report demonstrate understanding of rationale for each prescribed medication as evidenced by patient report attend all scheduled medical appointments as evidenced by patient report and EMR review demonstrate ongoing  adherence to prescribed treatment plan for LVA, stroke, MI, migraines, HTN, TIA, AVD, asthma, rhinitis, COPD, sinusitis, dermatitis, CKD, tobacco use, HLD    as evidenced by patient report and EMR review continue to work with RN Care Manager to address care management and care coordination needs related to LVA, stroke, MI, migraines, HTN, TIA, AVD, asthma, rhinitis, COPD, sinusitis, dermatitis, CKD, tobacco use, HLD  as evidenced by adherence to CM Team Scheduled appointments through collaboration with RN Care manager, provider, and care team.   Interventions: Inter-disciplinary care team collaboration (see longitudinal plan of care) Evaluation of current treatment plan related to  self management and patient's adherence to plan as established by provider  Smoking Cessation Interventions:  (Status:  New goal.) Long Term Goal Reviewed smoking history:   currently smoking less than 1ppd ppd-patient has no desire to quit smoking Evaluation of current treatment  plan reviewed Reviewed scheduled/upcoming provider appointments  Discussed plans with patient for ongoing care management follow up and provided patient with direct contact information for care management team Assessed social determinant of health barriers 08/05/22-has decreased smoking to 1/2 ppd  Asthma: (Status:New goal.) Long Term Goal Discussed the importance of adequate rest and management of fatigue with Asthma Assessed social determinant of health barriers    Chronic Kidney Disease Interventions:  (Status:  New goal.) Long Term Goal Evaluation of current treatment plan related to chronic kidney disease self management and patient's adherence to plan as established by  provider      Reviewed prescribed diet Reviewed medications with patient and discussed importance of compliance    Reviewed scheduled/upcoming provider appointments including    Discussed plans with patient for ongoing care management follow up and provided patient with direct contact information for care management team    Assessed social determinant of health barriers    Last practice recorded BP readings:  BP Readings from Last 3 Encounters:           09/08/22         162/85 06/23/22         110/80 08/04/22         126/68  Most recent eGFR/CrCl:  Lab Results  Component Value Date   EGFR 39 (L) 11/19/2021    No components found for: "CRCL"  COPD Interventions:  (Status:  New goal.) Long Term Goal Discussed the importance of adequate rest and management of fatigue with COPD Assessed social determinant of health barriers  Hyperlipidemia Interventions:  (Status:  New goal.) Long Term Goal Medication review performed; medication list updated in electronic medical record.  Provider established cholesterol goals reviewed Counseled on importance of regular laboratory monitoring as prescribed Reviewed importance of limiting foods high in cholesterol Assessed social determinant of health barriers   Hypertension Interventions:  (Status:  New goal.) Long Term Goal Last practice recorded BP readings:  BP Readings from Last 3 Encounters:        09/08/22           162/85  03/22/22         114/66 06/23/22         110/80 08/04/22         126/68  Most recent eGFR/CrCl:  Lab Results  Component Value Date   EGFR 39 (L) 11/19/2021    No components found for: "CRCL"  Evaluation of current treatment plan related to hypertension self management and patient's adherence to plan as established by provider Reviewed medications with patient and discussed importance of compliance Discussed plans with patient for ongoing care management follow up and provided patient with direct contact  information for care management team Reviewed scheduled/upcoming provider appointments including:  Discussed complications of poorly controlled blood pressure such as heart disease, stroke, circulatory complications, vision complications, kidney impairment, sexual dysfunction Assessed social determinant of health barriers  Patient Goals/Self-Care Activities: Take all medications as prescribed Attend all scheduled provider appointments Call pharmacy for medication refills 3-7 days in advance of running out of medications Perform all self care activities independently  Perform IADL's (shopping, preparing meals, housekeeping, managing finances) independently Call provider office for new concerns or questions  Patient to follow up on dental providers/PCS services  Follow Up Plan:  The patient has been provided with contact information for the care management team and has been advised to call with any health related questions or concerns.  The care management team will reach  out to the patient again over the next 45 business  days.     Long-Range Goal: Establish Plan of Care for Chronic Disease Management Needs   Priority: High  Note:   Timeframe:  Long-Range Goal Priority:  High Start Date:  06/24/22                        Expected End Date: ongoing                      Follow Up Date 10/15/22   - practice safe sex - schedule appointment for vaccines needed due to my age or health - schedule recommended health tests (blood work, mammogram, colonoscopy, pap test) - schedule and keep appointment for annual check-up    Why is this important?   Screening tests can find diseases early when they are easier to treat.  Your doctor or nurse will talk with you about which tests are important for you.  Getting shots for common diseases like the flu and shingles will help prevent them.  09/14/22:  to have CABG in October, dental appt in Sept. Recent appts with [facility], [facility], [facility]   Follow Up:   Patient agrees to Care Plan and Follow-up.  Plan: The Managed Medicaid care management team will reach out to the patient again over the next 30 business  days. and The  Patient has been provided with contact information for the Managed Medicaid care management team and has been advised to call with any health related questions or concerns.  Date/time of next scheduled RN care management/care coordination outreach: 10/15/22 at 1030.

## 2022-09-14 NOTE — Patient Instructions (Signed)
Hi Gabrielle Schultz, thank you for updating me today-have a good afternoon!  Gabrielle Schultz was given information about Medicaid Managed Care team care coordination services as a part of their [place] Medicaid benefit. Gabrielle Schultz verbally consented to engagement with the [facility] Managed Care team.   If you are experiencing a medical emergency, please call 911 or report to your local emergency department or urgent care.   If you have a non-emergency medical problem during routine business hours, please contact your provider's office and ask to speak with a nurse.   For questions related to your [place] health plan, please call: [phone]  If you would like to schedule transportation through your [place] plan, please call the following number at least 2 days in advance of your appointment: [phone].   There is no limit to the number of trips during the year between medical appointments, healthcare facilities, or pharmacies. Transportation must be scheduled at least 2 business days before but not more than thirty 30 days before of your appointment.  Call the Behavioral Health Crisis Line at [phone], at any time, 24 hours a day, 7 days a week. If you are in danger or need immediate medical attention call 911.  If you would like help to quit smoking, call 1-800-QUIT-NOW (325-356-3176) OR Espaol: 1-855-Djelo-Ya (4-132-440-1027) o para ms informacin haga clic aqu or Text READY to 253-664 to register via text  Gabrielle Schultz - following are the goals we discussed in your visit today:   Timeframe:  Long-Range Goal Priority:  High Start Date:  06/24/22                        Expected End Date: ongoing                      Follow Up Date 10/15/22   - practice safe sex - schedule appointment for vaccines needed due to my age or health - schedule recommended health tests (blood work, mammogram, colonoscopy, pap test) - schedule and keep  appointment for annual check-up    Why is this important?   Screening tests can find diseases early when they are easier to treat.  Your doctor or nurse will talk with you about which tests are important for you.  Getting shots for common diseases like the flu and shingles will help prevent them.  09/14/22:  to have CABG in October, dental appt in Sept. Recent appts with [facility], [facility], [facility]  Patient verbalizes understanding of instructions and care plan provided today and agrees to view in MyChart. Active MyChart status and patient understanding of how to access instructions and care plan via MyChart confirmed with patient.     The Managed Medicaid care management team will reach out to the patient again over the next 30 business  days.  The  Patient  has been provided with contact information for the Managed Medicaid care management team and has been advised to call with any health related questions or concerns.   [name], BSN [place]  [facility] Care Management Coordinator - Managed Medicaid High Risk [phone]   Following is a copy of your plan of care:  Care Plan : RN Care Manager Plan of Care  Updates made by [name], RN since 09/14/2022 12:00 AM     Problem: Health Promotion or Disease Self-Management (General Plan of Care)      Long-Range Goal: Chronic Disease  Management   Start Date: 03/19/2022  Expected End Date: 12/14/2022  Priority: High  Note:   Current Barriers:  Knowledge Deficits related to plan of care for management of LVA, stroke, MI, migraines, HTN, TIA, AVD, asthma, rhinitis, COPD, sinusitis, dermatitis, CKD, tobacco use, HLD  Chronic Disease Management support and education needs related to LVA, stroke, MI, migraines, HTN, TIA, AVD, asthma, rhinitis, COPD, sinusitis, dermatitis, CKD, tobacco use, HLD  09/14/22:  To schedule CABG and eye appt.  Has dental appt next month.  Does not check BP, smokes 1/2 ppd-no desire to quit at this  time.  Some SOB with exertion-Lasix M-F.  Cough unchanged.  RNCM Clinical Goal(s):  Patient will verbalize understanding of plan for management of LVA, stroke, MI, migraines, HTN, TIA, AVD, asthma, rhinitis, COPD, sinusitis, dermatitis, CKD, tobacco use, HLD as evidenced by patient report verbalize basic understanding of LVA, stroke, MI, migraines, HTN, TIA, AVD, asthma, rhinitis, COPD, sinusitis, dermatitis, CKD, tobacco use, HLD   disease process and self health management plan as evidenced by patient report take all medications exactly as prescribed and will call provider for medication related questions as evidenced by patient report demonstrate understanding of rationale for each prescribed medication as evidenced by patient report attend all scheduled medical appointments as evidenced by patient report and EMR review demonstrate ongoing  adherence to prescribed treatment plan for LVA, stroke, MI, migraines, HTN, TIA, AVD, asthma, rhinitis, COPD, sinusitis, dermatitis, CKD, tobacco use, HLD    as evidenced by patient report and EMR review continue to work with RN Care Manager to address care management and care coordination needs related to LVA, stroke, MI, migraines, HTN, TIA, AVD, asthma, rhinitis, COPD, sinusitis, dermatitis, CKD, tobacco use, HLD  as evidenced by adherence to CM Team Scheduled appointments through collaboration with RN Care manager, provider, and care team.   Interventions: Inter-disciplinary care team collaboration (see longitudinal plan of care) Evaluation of current treatment plan related to  self management and patient's adherence to plan as established by provider  Smoking Cessation Interventions:  (Status:  New goal.) Long Term Goal Reviewed smoking history:   currently smoking less than 1ppd ppd-patient has no desire to quit smoking Evaluation of current treatment plan reviewed Reviewed scheduled/upcoming provider appointments  Discussed plans with patient for  ongoing care management follow up and provided patient with direct contact information for care management team Assessed social determinant of health barriers 08/05/22-has decreased smoking to 1/2 ppd  Asthma: (Status:New goal.) Long Term Goal Discussed the importance of adequate rest and management of fatigue with Asthma Assessed social determinant of health barriers    Chronic Kidney Disease Interventions:  (Status:  New goal.) Long Term Goal Evaluation of current treatment plan related to chronic kidney disease self management and patient's adherence to plan as established by provider      Reviewed prescribed diet Reviewed medications with patient and discussed importance of compliance    Reviewed scheduled/upcoming provider appointments including    Discussed plans with patient for ongoing care management follow up and provided patient with direct contact information for care management team    Assessed social determinant of health barriers    Last practice recorded BP readings:  BP Readings from Last 3 Encounters:           09/08/22         162/85 06/23/22         110/80 08/04/22         126/68  Most recent eGFR/CrCl:  Lab  Results  Component Value Date   EGFR 39 (L) 11/19/2021    No components found for: "CRCL"  COPD Interventions:  (Status:  New goal.) Long Term Goal Discussed the importance of adequate rest and management of fatigue with COPD Assessed social determinant of health barriers  Hyperlipidemia Interventions:  (Status:  New goal.) Long Term Goal Medication review performed; medication list updated in electronic medical record.  Provider established cholesterol goals reviewed Counseled on importance of regular laboratory monitoring as prescribed Reviewed importance of limiting foods high in cholesterol Assessed social determinant of health barriers   Hypertension Interventions:  (Status:  New goal.) Long Term Goal Last practice recorded BP readings:  BP  Readings from Last 3 Encounters:        09/08/22           162/85  03/22/22         114/66 06/23/22         110/80 08/04/22         126/68  Most recent eGFR/CrCl:  Lab Results  Component Value Date   EGFR 39 (L) 11/19/2021    No components found for: "CRCL"  Evaluation of current treatment plan related to hypertension self management and patient's adherence to plan as established by provider Reviewed medications with patient and discussed importance of compliance Discussed plans with patient for ongoing care management follow up and provided patient with direct contact information for care management team Reviewed scheduled/upcoming provider appointments including:  Discussed complications of poorly controlled blood pressure such as heart disease, stroke, circulatory complications, vision complications, kidney impairment, sexual dysfunction Assessed social determinant of health barriers  Patient Goals/Self-Care Activities: Take all medications as prescribed Attend all scheduled provider appointments Call pharmacy for medication refills 3-7 days in advance of running out of medications Perform all self care activities independently  Perform IADL's (shopping, preparing meals, housekeeping, managing finances) independently Call provider office for new concerns or questions  Patient to follow up on dental providers/PCS services  Follow Up Plan:  The patient has been provided with contact information for the care management team and has been advised to call with any health related questions or concerns.  The care management team will reach out to the patient again over the next 45 business  days.

## 2022-09-17 ENCOUNTER — Other Ambulatory Visit: Payer: Self-pay | Admitting: *Deleted

## 2022-09-17 DIAGNOSIS — I359 Nonrheumatic aortic valve disorder, unspecified: Secondary | ICD-10-CM

## 2022-09-17 DIAGNOSIS — I2510 Atherosclerotic heart disease of native coronary artery without angina pectoris: Secondary | ICD-10-CM

## 2022-09-20 ENCOUNTER — Other Ambulatory Visit: Payer: Self-pay | Admitting: *Deleted

## 2022-09-20 ENCOUNTER — Encounter: Payer: Self-pay | Admitting: *Deleted

## 2022-09-20 DIAGNOSIS — I350 Nonrheumatic aortic (valve) stenosis: Secondary | ICD-10-CM

## 2022-09-20 DIAGNOSIS — I359 Nonrheumatic aortic valve disorder, unspecified: Secondary | ICD-10-CM

## 2022-09-20 DIAGNOSIS — I2510 Atherosclerotic heart disease of native coronary artery without angina pectoris: Secondary | ICD-10-CM

## 2022-09-21 ENCOUNTER — Encounter: Payer: Self-pay | Admitting: Emergency Medicine

## 2022-09-21 ENCOUNTER — Encounter: Admission: EM | Disposition: E | Payer: Self-pay | Source: Home / Self Care | Attending: Internal Medicine

## 2022-09-21 ENCOUNTER — Ambulatory Visit: Payer: Medicaid Other | Admitting: Cardiovascular Disease

## 2022-09-21 ENCOUNTER — Other Ambulatory Visit: Payer: Self-pay

## 2022-09-21 ENCOUNTER — Inpatient Hospital Stay
Admission: EM | Admit: 2022-09-21 | Discharge: 2022-10-05 | DRG: 250 | Disposition: E | Payer: Medicaid Other | Attending: Internal Medicine | Admitting: Internal Medicine

## 2022-09-21 ENCOUNTER — Emergency Department: Payer: Medicaid Other

## 2022-09-21 DIAGNOSIS — I2102 ST elevation (STEMI) myocardial infarction involving left anterior descending coronary artery: Principal | ICD-10-CM | POA: Diagnosis present

## 2022-09-21 DIAGNOSIS — Z841 Family history of disorders of kidney and ureter: Secondary | ICD-10-CM

## 2022-09-21 DIAGNOSIS — I351 Nonrheumatic aortic (valve) insufficiency: Secondary | ICD-10-CM | POA: Diagnosis present

## 2022-09-21 DIAGNOSIS — Z7951 Long term (current) use of inhaled steroids: Secondary | ICD-10-CM

## 2022-09-21 DIAGNOSIS — I2510 Atherosclerotic heart disease of native coronary artery without angina pectoris: Secondary | ICD-10-CM | POA: Diagnosis present

## 2022-09-21 DIAGNOSIS — I739 Peripheral vascular disease, unspecified: Secondary | ICD-10-CM | POA: Diagnosis present

## 2022-09-21 DIAGNOSIS — Z8349 Family history of other endocrine, nutritional and metabolic diseases: Secondary | ICD-10-CM

## 2022-09-21 DIAGNOSIS — I252 Old myocardial infarction: Secondary | ICD-10-CM

## 2022-09-21 DIAGNOSIS — Z880 Allergy status to penicillin: Secondary | ICD-10-CM

## 2022-09-21 DIAGNOSIS — I213 ST elevation (STEMI) myocardial infarction of unspecified site: Secondary | ICD-10-CM | POA: Diagnosis present

## 2022-09-21 DIAGNOSIS — N189 Chronic kidney disease, unspecified: Secondary | ICD-10-CM

## 2022-09-21 DIAGNOSIS — I447 Left bundle-branch block, unspecified: Secondary | ICD-10-CM | POA: Diagnosis present

## 2022-09-21 DIAGNOSIS — F17210 Nicotine dependence, cigarettes, uncomplicated: Secondary | ICD-10-CM | POA: Diagnosis present

## 2022-09-21 DIAGNOSIS — Z5982 Transportation insecurity: Secondary | ICD-10-CM | POA: Diagnosis not present

## 2022-09-21 DIAGNOSIS — N184 Chronic kidney disease, stage 4 (severe): Secondary | ICD-10-CM | POA: Diagnosis present

## 2022-09-21 DIAGNOSIS — Z833 Family history of diabetes mellitus: Secondary | ICD-10-CM

## 2022-09-21 DIAGNOSIS — I129 Hypertensive chronic kidney disease with stage 1 through stage 4 chronic kidney disease, or unspecified chronic kidney disease: Secondary | ICD-10-CM | POA: Diagnosis present

## 2022-09-21 DIAGNOSIS — Z825 Family history of asthma and other chronic lower respiratory diseases: Secondary | ICD-10-CM

## 2022-09-21 DIAGNOSIS — Z555 Less than a high school diploma: Secondary | ICD-10-CM | POA: Diagnosis not present

## 2022-09-21 DIAGNOSIS — I061 Rheumatic aortic insufficiency: Secondary | ICD-10-CM | POA: Diagnosis present

## 2022-09-21 DIAGNOSIS — Z7902 Long term (current) use of antithrombotics/antiplatelets: Secondary | ICD-10-CM

## 2022-09-21 DIAGNOSIS — Z79899 Other long term (current) drug therapy: Secondary | ICD-10-CM

## 2022-09-21 DIAGNOSIS — Z8673 Personal history of transient ischemic attack (TIA), and cerebral infarction without residual deficits: Secondary | ICD-10-CM | POA: Diagnosis not present

## 2022-09-21 DIAGNOSIS — I469 Cardiac arrest, cause unspecified: Secondary | ICD-10-CM | POA: Diagnosis not present

## 2022-09-21 DIAGNOSIS — J9601 Acute respiratory failure with hypoxia: Secondary | ICD-10-CM | POA: Diagnosis not present

## 2022-09-21 DIAGNOSIS — Z91018 Allergy to other foods: Secondary | ICD-10-CM

## 2022-09-21 DIAGNOSIS — E785 Hyperlipidemia, unspecified: Secondary | ICD-10-CM | POA: Diagnosis present

## 2022-09-21 DIAGNOSIS — Z8249 Family history of ischemic heart disease and other diseases of the circulatory system: Secondary | ICD-10-CM | POA: Diagnosis not present

## 2022-09-21 DIAGNOSIS — Z888 Allergy status to other drugs, medicaments and biological substances status: Secondary | ICD-10-CM

## 2022-09-21 DIAGNOSIS — R110 Nausea: Secondary | ICD-10-CM | POA: Diagnosis present

## 2022-09-21 DIAGNOSIS — Z560 Unemployment, unspecified: Secondary | ICD-10-CM | POA: Diagnosis not present

## 2022-09-21 DIAGNOSIS — Z7982 Long term (current) use of aspirin: Secondary | ICD-10-CM

## 2022-09-21 DIAGNOSIS — J45909 Unspecified asthma, uncomplicated: Secondary | ICD-10-CM | POA: Diagnosis present

## 2022-09-21 DIAGNOSIS — R0602 Shortness of breath: Secondary | ICD-10-CM | POA: Diagnosis present

## 2022-09-21 HISTORY — PX: CORONARY ANGIOGRAPHY: CATH118303

## 2022-09-21 HISTORY — PX: CORONARY/GRAFT ACUTE MI REVASCULARIZATION: CATH118305

## 2022-09-21 LAB — BASIC METABOLIC PANEL
Anion gap: 12 (ref 5–15)
BUN: 37 mg/dL — ABNORMAL HIGH (ref 6–20)
CO2: 18 mmol/L — ABNORMAL LOW (ref 22–32)
Calcium: 9.2 mg/dL (ref 8.9–10.3)
Chloride: 111 mmol/L (ref 98–111)
Creatinine, Ser: 2.12 mg/dL — ABNORMAL HIGH (ref 0.44–1.00)
GFR, Estimated: 28 mL/min — ABNORMAL LOW (ref >60)
Glucose, Bld: 117 mg/dL — ABNORMAL HIGH (ref 70–99)
Potassium: 3.6 mmol/L (ref 3.5–5.1)
Sodium: 141 mmol/L (ref 135–145)

## 2022-09-21 LAB — CBC
HCT: 33.7 % — ABNORMAL LOW (ref 36.0–46.0)
Hemoglobin: 11.3 g/dL — ABNORMAL LOW (ref 12.0–15.0)
MCH: 32.6 pg (ref 26.0–34.0)
MCHC: 33.5 g/dL (ref 30.0–36.0)
MCV: 97.1 fL (ref 80.0–100.0)
Platelets: 255 10*3/uL (ref 150–400)
RBC: 3.47 MIL/uL — ABNORMAL LOW (ref 3.87–5.11)
RDW: 16.6 % — ABNORMAL HIGH (ref 11.5–15.5)
WBC: 13.3 10*3/uL — ABNORMAL HIGH (ref 4.0–10.5)
nRBC: 0 % (ref 0.0–0.2)

## 2022-09-21 LAB — POCT I-STAT 7, (LYTES, BLD GAS, ICA,H+H)
Acid-base deficit: 15 mmol/L — ABNORMAL HIGH (ref 0.0–2.0)
Bicarbonate: 17 mmol/L — ABNORMAL LOW (ref 20.0–28.0)
Calcium, Ion: 1.07 mmol/L — ABNORMAL LOW (ref 1.15–1.40)
HCT: 37 % (ref 36.0–46.0)
Hemoglobin: 12.6 g/dL (ref 12.0–15.0)
O2 Saturation: 72 %
Potassium: 5 mmol/L (ref 3.5–5.1)
Sodium: 146 mmol/L — ABNORMAL HIGH (ref 135–145)
TCO2: 19 mmol/L — ABNORMAL LOW (ref 22–32)
pCO2 arterial: 71 mmHg (ref 32–48)
pH, Arterial: 6.988 — CL (ref 7.35–7.45)
pO2, Arterial: 59 mmHg — ABNORMAL LOW (ref 83–108)

## 2022-09-21 LAB — LIPID PANEL
Cholesterol: 125 mg/dL (ref 0–200)
HDL: 26 mg/dL — ABNORMAL LOW (ref >40)
LDL Cholesterol: 59 mg/dL (ref 0–99)
Total CHOL/HDL Ratio: 4.8 ratio
Triglycerides: 201 mg/dL — ABNORMAL HIGH (ref <150)
VLDL: 40 mg/dL (ref 0–40)

## 2022-09-21 LAB — PROTIME-INR
INR: 1 (ref 0.8–1.2)
Prothrombin Time: 13.7 s (ref 11.4–15.2)

## 2022-09-21 LAB — TROPONIN I (HIGH SENSITIVITY): Troponin I (High Sensitivity): 90 ng/L — ABNORMAL HIGH (ref <18)

## 2022-09-21 LAB — APTT: aPTT: 28 s (ref 24–36)

## 2022-09-21 SURGERY — CORONARY/GRAFT ACUTE MI REVASCULARIZATION
Anesthesia: Moderate Sedation

## 2022-09-21 MED ORDER — FAMOTIDINE 20 MG PO TABS: 20.0000 mg | ORAL_TABLET | Freq: Two times a day (BID) | ORAL | Status: DC

## 2022-09-21 MED ORDER — FAMOTIDINE IN NACL 20-0.9 MG/50ML-% IV SOLN: 20.0000 mg | Freq: Two times a day (BID) | INTRAVENOUS | Status: DC

## 2022-09-21 MED ORDER — NOREPINEPHRINE 4 MG/250ML-% IV SOLN: 0.0000 ug/min | INTRAVENOUS | Status: DC

## 2022-09-21 MED ORDER — MIDAZOLAM HCL 2 MG/2ML IJ SOLN: 1.0000 mg | INTRAMUSCULAR | Status: DC | PRN

## 2022-09-21 MED ORDER — HEPARIN (PORCINE) IN NACL 2000-0.9 UNIT/L-% IV SOLN
INTRAVENOUS | Status: DC | PRN
  Administered 2022-09-21: 1000 mL

## 2022-09-21 MED ORDER — ONDANSETRON HCL 4 MG/2ML IJ SOLN: 4.0000 mg | Freq: Four times a day (QID) | INTRAMUSCULAR | Status: DC | PRN

## 2022-09-21 MED ORDER — HEPARIN SODIUM (PORCINE) 5000 UNIT/ML IJ SOLN: 5000.0000 [IU] | Freq: Three times a day (TID) | INTRAMUSCULAR | Status: DC

## 2022-09-21 MED ORDER — MIDAZOLAM HCL 2 MG/2ML IJ SOLN
INTRAMUSCULAR | Status: DC | PRN
  Administered 2022-09-21: 1 mg via INTRAVENOUS

## 2022-09-21 MED ORDER — ASPIRIN 81 MG PO CHEW: 162.0000 mg | CHEWABLE_TABLET | Freq: Once | ORAL | Status: DC

## 2022-09-21 MED ORDER — ACETAMINOPHEN 325 MG PO TABS: 650.0000 mg | ORAL_TABLET | ORAL | Status: DC | PRN

## 2022-09-21 MED ORDER — PROPOFOL 1000 MG/100ML IV EMUL
INTRAVENOUS | Status: AC
  Filled 2022-09-21: qty 100

## 2022-09-21 MED ORDER — ONDANSETRON HCL 4 MG/2ML IJ SOLN
INTRAMUSCULAR | Status: DC | PRN
  Administered 2022-09-21: 4 mg via INTRAVENOUS

## 2022-09-21 MED ORDER — SODIUM CHLORIDE 0.9 % IV SOLN: 250.0000 mL | INTRAVENOUS | Status: DC | PRN

## 2022-09-21 MED ORDER — HEPARIN (PORCINE) IN NACL 1000-0.9 UT/500ML-% IV SOLN
INTRAVENOUS | Status: AC
  Filled 2022-09-21: qty 1000

## 2022-09-21 MED ORDER — EPINEPHRINE PF 1 MG/ML IJ SOLN
INTRAMUSCULAR | Status: AC
  Filled 2022-09-21: qty 4

## 2022-09-21 MED ORDER — HEPARIN SODIUM (PORCINE) 1000 UNIT/ML IJ SOLN
INTRAMUSCULAR | Status: AC
  Filled 2022-09-21: qty 10

## 2022-09-21 MED ORDER — ASPIRIN 81 MG PO CHEW
243.0000 mg | CHEWABLE_TABLET | Freq: Once | ORAL | Status: AC
  Administered 2022-09-21: 243 mg via ORAL

## 2022-09-21 MED ORDER — MIDAZOLAM HCL 2 MG/2ML IJ SOLN
INTRAMUSCULAR | Status: AC
  Filled 2022-09-21: qty 2

## 2022-09-21 MED ORDER — FENTANYL CITRATE (PF) 100 MCG/2ML IJ SOLN: 50.0000 ug | Freq: Once | INTRAMUSCULAR | Status: DC

## 2022-09-21 MED ORDER — FENTANYL CITRATE PF 50 MCG/ML IJ SOSY
50.0000 ug | PREFILLED_SYRINGE | Freq: Once | INTRAMUSCULAR | Status: AC
  Administered 2022-09-21: 50 ug via INTRAVENOUS
  Filled 2022-09-21: qty 1

## 2022-09-21 MED ORDER — HEPARIN SODIUM (PORCINE) 1000 UNIT/ML IJ SOLN
4000.0000 [IU] | Freq: Once | INTRAMUSCULAR | Status: AC
  Administered 2022-09-21: 4000 [IU] via INTRAVENOUS

## 2022-09-21 MED ORDER — POLYETHYLENE GLYCOL 3350 17 G PO PACK
17.0000 g | PACK | Freq: Every day | ORAL | Status: DC
  Filled 2022-09-21: qty 1

## 2022-09-21 MED ORDER — EPINEPHRINE 1 MG/10ML IJ SOSY
PREFILLED_SYRINGE | INTRAMUSCULAR | Status: AC
  Filled 2022-09-21: qty 10

## 2022-09-21 MED ORDER — FUROSEMIDE 10 MG/ML IJ SOLN
INTRAMUSCULAR | Status: DC | PRN
  Administered 2022-09-21: 40 mg via INTRAVENOUS

## 2022-09-21 MED ORDER — NOREPINEPHRINE BITARTRATE 1 MG/ML IV SOLN
INTRAVENOUS | Status: AC
  Filled 2022-09-21: qty 4

## 2022-09-21 MED ORDER — ETOMIDATE 2 MG/ML IV SOLN
INTRAVENOUS | Status: AC
  Filled 2022-09-21: qty 20

## 2022-09-21 MED ORDER — FENTANYL CITRATE (PF) 100 MCG/2ML IJ SOLN
INTRAMUSCULAR | Status: DC | PRN
  Administered 2022-09-21: 25 ug via INTRAVENOUS

## 2022-09-21 MED ORDER — IOHEXOL 300 MG/ML  SOLN
INTRAMUSCULAR | Status: DC | PRN
  Administered 2022-09-21: 70 mL

## 2022-09-21 MED ORDER — FUROSEMIDE 10 MG/ML IJ SOLN
INTRAMUSCULAR | Status: AC
  Filled 2022-09-21: qty 4

## 2022-09-21 MED ORDER — FENTANYL CITRATE (PF) 100 MCG/2ML IJ SOLN
INTRAMUSCULAR | Status: AC
  Filled 2022-09-21: qty 2

## 2022-09-21 MED ORDER — NITROGLYCERIN 0.4 MG SL SUBL
0.4000 mg | SUBLINGUAL_TABLET | SUBLINGUAL | Status: DC | PRN
  Administered 2022-09-21 (×3): 0.4 mg via SUBLINGUAL

## 2022-09-21 MED ORDER — LIDOCAINE HCL 1 % IJ SOLN
INTRAMUSCULAR | Status: AC
  Filled 2022-09-21: qty 20

## 2022-09-21 MED ORDER — HEPARIN SODIUM (PORCINE) 5000 UNIT/ML IJ SOLN: 4000.0000 [IU] | Freq: Once | INTRAMUSCULAR | Status: DC

## 2022-09-21 MED ORDER — CANGRELOR TETRASODIUM 50 MG IV SOLR
INTRAVENOUS | Status: AC
  Filled 2022-09-21: qty 50

## 2022-09-21 MED ORDER — DOCUSATE SODIUM 100 MG PO CAPS: 100.0000 mg | ORAL_CAPSULE | Freq: Two times a day (BID) | ORAL | Status: DC | PRN

## 2022-09-21 MED ORDER — SODIUM CHLORIDE 0.9% FLUSH: 3.0000 mL | INTRAVENOUS | Status: DC | PRN

## 2022-09-21 MED ORDER — POLYETHYLENE GLYCOL 3350 17 G PO PACK: 17.0000 g | PACK | Freq: Every day | ORAL | Status: DC | PRN

## 2022-09-21 MED ORDER — VERAPAMIL HCL 2.5 MG/ML IV SOLN
INTRAVENOUS | Status: AC
  Filled 2022-09-21: qty 2

## 2022-09-21 MED ORDER — LIDOCAINE HCL (PF) 1 % IJ SOLN
INTRAMUSCULAR | Status: DC | PRN
  Administered 2022-09-21: 10 mL

## 2022-09-21 MED ORDER — SODIUM BICARBONATE 8.4 % IV SOLN
INTRAVENOUS | Status: DC | PRN
  Administered 2022-09-21 (×3): 50 meq via INTRAVENOUS

## 2022-09-21 MED ORDER — SUCCINYLCHOLINE CHLORIDE 200 MG/10ML IV SOSY
PREFILLED_SYRINGE | INTRAVENOUS | Status: AC
  Filled 2022-09-21: qty 10

## 2022-09-21 MED ORDER — EPINEPHRINE HCL 5 MG/250ML IV SOLN IN NS
0.5000 ug/min | INTRAVENOUS | Status: DC
  Filled 2022-09-21: qty 250

## 2022-09-21 MED ORDER — EPINEPHRINE PF 1 MG/ML IJ SOLN
INTRAVENOUS | Status: AC | PRN
  Administered 2022-09-21: 40 ug/kg/min via INTRAVENOUS

## 2022-09-21 MED ORDER — NOREPINEPHRINE BITARTRATE 1 MG/ML IV SOLN
INTRAVENOUS | Status: AC | PRN
  Administered 2022-09-21: 15 ug/min via INTRAVENOUS

## 2022-09-21 MED ORDER — EPINEPHRINE PF 1 MG/ML IJ SOLN
INTRAMUSCULAR | Status: AC
  Filled 2022-09-21: qty 1

## 2022-09-21 MED ORDER — FENTANYL BOLUS VIA INFUSION: 50.0000 ug | INTRAVENOUS | Status: DC | PRN

## 2022-09-21 MED ORDER — SODIUM CHLORIDE 0.9% FLUSH: 3.0000 mL | Freq: Two times a day (BID) | INTRAVENOUS | Status: DC

## 2022-09-21 MED ORDER — FENTANYL 2500MCG IN NS 250ML (10MCG/ML) PREMIX INFUSION: 50.0000 ug/h | INTRAVENOUS | Status: DC

## 2022-09-21 MED ORDER — EPINEPHRINE 1 MG/10ML IJ SOSY
PREFILLED_SYRINGE | INTRAMUSCULAR | Status: DC | PRN
  Administered 2022-09-21 (×5): 1 mg via INTRAVENOUS

## 2022-09-21 MED ORDER — DOCUSATE SODIUM 50 MG/5ML PO LIQD
100.0000 mg | Freq: Two times a day (BID) | ORAL | Status: DC
  Filled 2022-09-21: qty 10

## 2022-09-21 MED ORDER — HEPARIN SODIUM (PORCINE) 1000 UNIT/ML IJ SOLN
INTRAMUSCULAR | Status: DC | PRN
  Administered 2022-09-21: 5000 [IU] via INTRAVENOUS

## 2022-09-21 MED ORDER — SODIUM CHLORIDE 0.9 % IV SOLN: INTRAVENOUS | Status: DC

## 2022-09-21 MED ORDER — NOREPINEPHRINE 4 MG/250ML-% IV SOLN: 2.0000 ug/min | INTRAVENOUS | Status: DC

## 2022-09-21 SURGICAL SUPPLY — 15 items
BALLN EUPHORA RX 2.0X12 (BALLOONS) ×1
BALLN TREK RX 2.75X15 (BALLOONS) ×1
BALLOON EUPHORA RX 2.0X12 (BALLOONS) IMPLANT
BALLOON TREK RX 2.75X15 (BALLOONS) IMPLANT
CANNULA 5F STIFF (CANNULA) IMPLANT
CATH INFINITI 5FR MULTPACK ANG (CATHETERS) IMPLANT
CATH VISTA GUIDE 6FR XBLAD3.5 (CATHETERS) IMPLANT
DRAPE BRACHIAL (DRAPES) IMPLANT
KIT ENCORE 26 ADVANTAGE (KITS) IMPLANT
PACK CARDIAC CATH (CUSTOM PROCEDURE TRAY) ×1 IMPLANT
SET ATX-X65L (MISCELLANEOUS) IMPLANT
SHEATH AVANTI 6FR X 11CM (SHEATH) IMPLANT
WIRE ASAHI PROWATER 180CM (WIRE) IMPLANT
WIRE GUIDERIGHT .035X150 (WIRE) IMPLANT
WIRE RUNTHROUGH .014X180CM (WIRE) IMPLANT

## 2022-09-22 ENCOUNTER — Encounter: Payer: Self-pay | Admitting: Internal Medicine

## 2022-09-22 ENCOUNTER — Other Ambulatory Visit: Payer: Self-pay | Admitting: *Deleted

## 2022-09-22 LAB — MAGNESIUM: Magnesium: 1.8 mg/dL (ref 1.7–2.4)

## 2022-09-22 LAB — PHOSPHORUS: Phosphorus: 3.1 mg/dL (ref 2.5–4.6)

## 2022-09-22 MED ORDER — CANGRELOR BOLUS VIA INFUSION
INTRAVENOUS | Status: DC | PRN
  Administered 2022-09-21: 2508 ug via INTRAVENOUS

## 2022-09-22 MED ORDER — SODIUM CHLORIDE 0.9 % IV SOLN
INTRAVENOUS | Status: DC | PRN
  Administered 2022-09-21: 4 ug/kg/min via INTRAVENOUS

## 2022-09-23 LAB — MISC LABCORP TEST (SEND OUT): Labcorp test code: 83935

## 2022-09-23 LAB — HEMOGLOBIN A1C
Hgb A1c MFr Bld: 5.8 % — ABNORMAL HIGH (ref 4.8–5.6)
Mean Plasma Glucose: 120 mg/dL

## 2022-09-30 ENCOUNTER — Ambulatory Visit: Payer: Medicaid Other | Admitting: Nurse Practitioner

## 2022-10-05 NOTE — Consult Note (Signed)
Cardiology Consultation   Patient ID: Gabrielle Schultz MRN: [id number]; DOB: Apr 30, 1970  Admit date: Oct 15, 2022 Date of Consult: 10/15/2022  PCP:  Gabrielle Kuster, NP   [facility] Providers Cardiologist:  Gabrielle Nordmann, MD  Electrophysiologist:  Gabrielle Prude, MD       Patient Profile:   Gabrielle Schultz is a 52 y.o. female with a hx of severe aortic valve regurgitation, coronary artery disease, stroke, PAD, hypertension, who is being seen 10/15/22 for the evaluation of chest pain and abnormal EKG at the request of Gabrielle Schultz.  History of Present Illness:   Gabrielle Schultz was in her usual state of health until this afternoon when she had sudden onset of chest pain.  She describes it as severe in the center of her chest with heaviness in both arms.  The pain does not radiate anywhere.  She also feels short of breath.  She is unable to provide further details, as she is in too much pain.  She underwent diagnostic catheterization last month for workup of aortic valve disease.  This showed 70% mid LAD and sequential 50% and 80% RCA lesions.  She was evaluated by cardiac surgery and is scheduled for CABG/AVR next month.   Past Medical History:  Diagnosis Date   Acid reflux    Allergic rhinitis 06/26/2015   Allergy    Asthma    Carpal tunnel syndrome    right hand   Chronic kidney disease    Colitis    CVA (cerebral infarction)    2009, 2015   Gout    History of syncope    Hypertension 2008   Low HDL (under 40) 06/26/2015   MI (myocardial infarction) (HCC)    2015 - patient wasnt told by cardiologist that she had MI but has had cardiac cath in past   Migraines    Poor circulation    Renal insufficiency    Stroke [facility])    Syncope and collapse     Past Surgical History:  Procedure Laterality Date   BUBBLE STUDY  12/03/2019   Procedure: BUBBLE STUDY;  Surgeon: Sande Rives, MD;  Location: [facility] ENDOSCOPY;  Service: Cardiovascular;;   BUBBLE STUDY   03/17/2022   Procedure: BUBBLE STUDY;  Surgeon: Thurmon Fair, MD;  Location: [facility] ENDOSCOPY;  Service: Cardiovascular;;   CARDIAC CATHETERIZATION     CESAREAN SECTION  1994   Dr. Arvil Chaco   COLONOSCOPY WITH PROPOFOL N/A 06/26/2014   Procedure: COLONOSCOPY WITH PROPOFOL;  Surgeon: Elnita Maxwell, MD;  Location: [facility] ENDOSCOPY;  Service: Endoscopy;  Laterality: N/A;   DILATION AND CURETTAGE OF UTERUS  x2 for incomplete SABs   RIGHT/LEFT HEART CATH AND CORONARY ANGIOGRAPHY Bilateral 08/10/2022   Procedure: RIGHT/LEFT HEART CATH AND CORONARY ANGIOGRAPHY;  Surgeon: Yvonne Kendall, MD;  Location: [facility] INVASIVE CV LAB;  Service: Cardiovascular;  Laterality: Bilateral;   TEE WITHOUT CARDIOVERSION N/A 12/03/2019   Procedure: TRANSESOPHAGEAL ECHOCARDIOGRAM (TEE);  Surgeon: Sande Rives, MD;  Location: [facility] ENDOSCOPY;  Service: Cardiovascular;  Laterality: N/A;   TEE WITHOUT CARDIOVERSION N/A 03/17/2022   Procedure: TRANSESOPHAGEAL ECHOCARDIOGRAM (TEE);  Surgeon: Thurmon Fair, MD;  Location: [facility] ENDOSCOPY;  Service: Cardiovascular;  Laterality: N/A;      Inpatient Medications: Scheduled Meds:  fentaNYL (SUBLIMAZE) injection  50 mcg Intravenous Once   Continuous Infusions:  sodium chloride 20 mL/hr at 2022-10-15 1735   PRN Meds: nitroGLYCERIN  Allergies:    Allergies  Allergen Reactions   Coconut (Cocos Nucifera) Hives and  Swelling    Just coconut ("all coconut")   Amoxicillin Other (See Comments)    "makes me sick" "swelled up everywhere" Has patient had a PCN reaction causing immediate rash, facial/tongue/throat swelling, SOB or lightheadedness with hypotension: No Has patient had a PCN reaction causing severe rash involving mucus membranes or skin necrosis: No Has patient had a PCN reaction that required hospitalization: No Has patient had a PCN reaction occurring within the last 10 years: No If all of the above answers are "NO", then may proceed with Cephalosporin use.      Penicillins Other (See Comments)    "makes me sick" "swelled up oil" Has patient had a PCN reaction causing immediate rash, facial/tongue/throat swelling, SOB or lightheadedness with hypotension: No Has patient had a PCN reaction causing severe rash involving mucus membranes or skin necrosis: No Has patient had a PCN reaction that required hospitalization: No Has patient had a PCN reaction occurring within the last 10 years: Yes If all of the above answers are "NO", then may proceed with Cephalosporin use.    Singulair [Montelukast Sodium] Rash    "made heart race"    Social History:   Social History   Tobacco Use   Smoking status: Every Day    Current packs/day: 0.50    Average packs/day: 0.5 packs/day for 33.1 years (16.6 ttl pk-yrs)    Types: Cigarettes    Start date: 03/04/2017   Smokeless tobacco: Never  Vaping Use   Vaping status: Never Used  Substance Use Topics   Alcohol use: Not Currently    Comment: rare- drinks an occassional wine cooler   Drug use: No      Family History:   Family History  Problem Relation Age of Onset   Hyperlipidemia Mother    Heart disease Mother        has a defibrillator   COPD Mother    Hypertension Father    Heart disease Father    Hyperlipidemia Father    Hypertension Sister        died in her 30s from heart failure and Dollie Bressi stage kidney disease   Kidney disease Sister    Hypertension Brother    Heart disease Brother        has a defibrillator   Coronary artery disease Brother        had CABG   Hyperlipidemia Brother    Healthy Child    Diabetes Child      ROS:  Please see the history of present illness. All other ROS reviewed and negative.     Physical Exam/Data:   Vitals:   2022/10/13 1713 Oct 13, 2022 1714  BP:  (!) 103/50  Pulse:  80  Resp:  18  Temp:  (!) 97.1 F (36.2 C)  TempSrc:  Axillary  SpO2:  100%  Weight: 73.9 kg   Height: 5\' 1"  (1.549 m)    No intake or output data in the 24 hours ending 10/13/2022 1738     10-13-22    5:13 PM 09/08/2022   10:07 AM 08/31/2022   10:44 AM  Last 3 Weights  Weight (lbs) 163 lb 163 lb 161 lb  Weight (kg) 73.936 kg 73.936 kg 73.029 kg     Body mass index is 30.8 kg/m.  General:  Uncomfortable appearing woman lying in bed.  Husband is at the bedside. HEENT: normal Neck: JVP difficult to assess due to body habitus. Vascular: No carotid bruits; Distal pulses 2+ bilaterally Cardiac: Regular rate and rhythm with  1/6 systolic murmur. Lungs: Mildly diminished breath sounds throughout without wheezes or crackles. Abd: soft, nontender, no hepatomegaly  Ext: no edema Musculoskeletal:  No deformities, BUE and BLE strength normal and equal Skin: Cool and clammy. Neuro:  CNs 2-12 intact, no focal abnormalities noted Psych: Patient appears uncomfortable.  EKG:  The EKG was personally reviewed and demonstrates: Normal sinus rhythm with new left bundle branch block and concordant ST elevation in V2.  PVC also present. Telemetry:  Telemetry was personally reviewed and demonstrates: Sinus rhythm with PVCs.  Relevant CV Studies: R/LHC (08/10/2022): Significant two-vessel coronary artery with 70% mid LAD stenosis and sequential 50% and 80% proximal/mid RCA lesions. Moderately elevated left and right heart filling pressures. Moderate pulmonary hypertension. Normal/supranormal Fick cardiac output/index. No significant aortic valve gradient. Small right radial artery, not able to be used for catheterization. Moderate stenosis of right common femoral artery.  Laboratory Data:  Hematology Recent Labs  Lab 10/04/2022 1714  WBC 13.3*  RBC 3.47*  HGB 11.3*  HCT 33.7*  MCV 97.1  MCH 32.6  MCHC 33.5  RDW 16.6*  PLT 255    Radiology/Studies:  No results found.   Assessment and Plan:   STEMI: Patient presents with acute onset of chest pain with initial EKG demonstrating new anterior ST segment elevation.  Subsequent EKG shows left bundle branch block with concordant ST  elevation in V2.  Findings are concerning for acute MI.  Patient also appears unwell with soft blood pressure.  I have recommended proceeding with emergent cardiac catheterization and possible PCI, which she is in agreement with.  She has known CKD; we will need to be mindful of her contrast exposure.  Aortic regurgitation: Thought to be severe by TEE with plans for CABG/AVR next month.  However, in the setting of suspected STEMI, valve intervention may need to be deferred.  Further recommendations to follow catheterization.  Chronic kidney disease: Creatinine over the last year has typically been in the 1 5-2.0 range, though it was 2.5 on last check on 8/27.  In the setting of her sudden onset of chest pain and new EKG changes concerning for acute infarct, we will need to proceed with catheterization and support her kidneys as well as possible.  For questions or updates, please contact [facility] Please consult [email or web address] for contact info under [facility].  Signed, Yvonne Kendall, MD  09/23/2022 5:38 PM

## 2022-10-05 NOTE — ED Provider Notes (Signed)
[facility] Provider Note    Event Date/Time   First MD Initiated Contact with Patient 10/03/2022 1721     (approximate)  History   Chief Complaint: Chest Pain  HPI  Gabrielle Schultz is a 52 y.o. female with a past medical history of CVA, hypertension, hyperlipidemia, MI, presents to the emergency department for acute onset of chest pain.  Approximately 10 minutes prior to arrival patient began experiencing central chest pain described as a "crushing" sensation.  Patient states shortness of breath and diaphoresis as well.  No nausea although the patient states she is beginning to feel somewhat nauseated now.  I reviewed the patient's chart she had a catheterization performed 08/10/2022 showing significant disease (Significant two-vessel coronary artery with 70% mid LAD stenosis and sequential 50% and 80% proximal/mid RCA lesions.).    Physical Exam   Triage Vital Signs: ED Triage Vitals  Encounter Vitals Group     BP 10/03/22 1714 (!) 103/50     Systolic BP Percentile --      Diastolic BP Percentile --      Pulse Rate 03-Oct-2022 1714 80     Resp 10/03/2022 1714 18     Temp 2022/10/03 1714 (!) 97.1 F (36.2 C)     Temp Source 10-03-22 1714 Axillary     SpO2 October 03, 2022 1714 100 %     Weight 10/03/2022 1713 163 lb (73.9 kg)     Height 2022/10/03 1713 5\' 1"  (1.549 m)     Head Circumference --      Peak Flow --      Pain Score 2022/10/03 1712 10     Pain Loc --      Pain Education --      Exclude from Growth Chart --     Most recent vital signs: Vitals:   10-03-22 1714  BP: (!) 103/50  Pulse: 80  Resp: 18  Temp: (!) 97.1 F (36.2 C)  SpO2: 100%    General: Awake, mild distress due to chest pain CV:  Good peripheral perfusion.  Regular rate and rhythm around 80 bpm. Resp:  Normal effort.  Equal breath sounds bilaterally.  Abd:  No distention.  Soft, nontender.  No rebound or guarding.  ED Results / Procedures / Treatments   EKG  I have reviewed and  interpreted the EKG shows a sinus rhythm at 77 bpm with a widened QRS, left axis deviation.  Largely normal intervals.  Patient however does have ST elevation in V1, V2 mildly in aVR with reciprocal depressions in leads II and aVF as well as V6.  I reviewed the patient's prior EKGs that do not appear to show similar elevation.  RADIOLOGY  I have reviewed and interpreted chest x-ray images no obvious consolidation on my evaluation. Radiology is read the x-ray is negative   MEDICATIONS ORDERED IN ED: Medications - No data to display   IMPRESSION / MDM / ASSESSMENT AND PLAN / ED COURSE  I reviewed the triage vital signs and the nursing notes.  Patient's presentation is most consistent with acute presentation with potential threat to life or bodily function.  Patient is EKG was shown to me from triage, was concerned given ST elevation in V1 and V2 with reciprocal depressions in 2 aVF V6.  I reviewed the old EKGs and this appears to be new.  Patient does have significant history including a catheterization performed 1 month ago showing a 70 and 80% lesion.  Activated code STEMI and brought  the patient emergently back to room 8 for evaluation.  I evaluated the patient she is describing 10 out of 10 central chest pain and a crushing sensation per patient along with shortness of breath no diaphoresis currently although she states she was very sweaty earlier.  States she is beginning to feel nauseated as well.  We will check labs chest x-ray we will dose 4000 units of heparin we will dose aspirin and continue to closely monitor.  We will dose nitroglycerin and fentanyl as needed for chest pain.  Spoke to Dr. Cristal Deer End of cardiology he will be in shortly.  CBC shows a leukocytosis of 13,000 otherwise reassuring.  Reviewed historical labs patient has chronic kidney disease.  Dr. Okey Dupre has already seen the patient will be taking to the catheterization lab. Lab work shows a reassuring CBC with mild  leukocytosis, chemistry shows renal insufficiency of 2.1 which is chronic, troponin mildly elevated at 90 although this is decreased from recent cervical values.  CRITICAL CARE Performed by: Minna Antis   Total critical care time: 30 minutes  Critical care time was exclusive of separately billable procedures and treating other patients.  Critical care was necessary to treat or prevent imminent or life-threatening deterioration.  Critical care was time spent personally by me on the following activities: development of treatment plan with patient and/or surrogate as well as nursing, discussions with consultants, evaluation of patient's response to treatment, examination of patient, obtaining history from patient or surrogate, ordering and performing treatments and interventions, ordering and review of laboratory studies, ordering and review of radiographic studies, pulse oximetry and re-evaluation of patient's condition.   FINAL CLINICAL IMPRESSION(S) / ED DIAGNOSES   STEMI   Note:  This document was prepared using Dragon voice recognition software and may include unintentional dictation errors.   Minna Antis, MD 09/13/2022 1904

## 2022-10-05 NOTE — ED Triage Notes (Signed)
Patient to ED via POV for chest pain. Pain centralized in chest- started 10 min PTA. States MI in 2015.

## 2022-10-05 NOTE — ED Notes (Signed)
All belongings placed in bag and given to patient's husband. This RN and Cardiologist to transport patient to cath lab at this time

## 2022-10-05 NOTE — Consult Note (Cosign Needed Addendum)
NAME:  Gabrielle Schultz, MRN:  [id number], DOB:  07/22/1970, LOS: 0 ADMISSION DATE:  09/25/2022, CONSULTATION DATE:  09/29/2022 REFERRING MD:  Dr. Okey Dupre, CHIEF COMPLAINT: Chest pain  History of Present Illness:  52 year old female presenting to [facility] ED for evaluation of chest pain.  History obtained via chart review as patient is not active cardiac arrest during the procedure with PCCM assisting. On 09/20/2022 around 1700 patient began experiencing centralized crushing 10 out of 10 chest pain with correlating shortness of breath and diaphoresis.  She denied nausea initially but upon arrival to ED began experiencing nausea.  She denied radiation of the pain and does admit she is chronically short of breath.  Of note patient underwent diagnostic catheterization in August 2024 for workup of her aortic valve disease which showed 70% mid LAD and sequential 50% and 80% RCA lesions.  She was being evaluated by cardiac surgery and is scheduled for a CABG/AVR next month.  ED course: Upon arrival initial EKG was concerning for STEMI with ST ED in V1 and V2 and reciprocal depressions in 2 aVF V6 which appear to be new from previous EKG.  Code STEMI activated.  She received 4000 units of heparin as well as aspirin.  Labs significant for mild NAGMA, leukocytosis and an elevated creatinine-unclear if this is her baseline.  Patient was taken urgently to Cath Lab for intervention.  While in Cath Lab patient became cyanotic requiring urgent intubation and mechanical ventilatory support.  PCCM urgently consulted for assistance.  Shortly after CODE BLUE initiated with ACLS and CPR.  PCCM consulted and stated to assist due to STEMI and acute hypoxic respiratory failure requiring emergent intubation and mechanical ventilatory support.  Pertinent  Medical History  CVA Hypertension Hyperlipidemia MI Severe aortic valve regurgitation CAD PAD Significant Hospital Events: Including procedures, antibiotic start and stop  dates in addition to other pertinent events   09/19/2022: STEMI, urgent Cath Lab intervention with urgent intubation and mechanical ventilatory support as well as CODE BLUE activation.  Interim History / Subjective:  Patient intubated and unresponsive  Objective   Blood pressure 124/79, pulse 84, temperature (!) 97.1 F (36.2 C), temperature source Axillary, resp. rate 16, height 5\' 1"  (1.549 m), weight 83.6 kg, last menstrual period 08/18/2020, SpO2 94%.    Vent Mode: PRVC FiO2 (%):  [100 %] 100 % Set Rate:  [20 bmp] 20 bmp Vt Set:  [450 mL] 450 mL PEEP:  [10 cmH20] 10 cmH20  No intake or output data in the 24 hours ending 09/29/2022 1955 Filed Weights   09/29/2022 1713 09/05/2022 1741  Weight: 73.9 kg 83.6 kg    Examination: General: Adult female, critically ill, lying in bed intubated & sedated requiring mechanical ventilation - actively in cardiac arrest, limited exam HEENT: MM pink/moist, anicteric, atraumatic, neck supple Neuro: unresponsive, unable to follow commands CV: s1s2 idioventricular rhythm intermittent PEA on monitor, no r/m/g Pulm: assisted via BVM, breath sounds coarse throughout GI: UTA- procedure in process Skin: limited exam- procedure in process no rashes/lesions noted Extremities- limited exam- procedure in process  Resolved Hospital Problem list     Assessment & Plan:  STEMI Cardiac Arrest Circulatory Shock PMHx: Severe aortic regurgitation, CAD with known two-vessel disease scheduled for CABG, HTN, HLD - Trend troponin -Consider TTM if appropriate - continue levophed, epinephrine drips- wean as tolerated to maintain MAP > 65 - Echocardiogram ordered - heparin drip per pharmacy consult - anti platelet therapy per cardiology - continuous cardiac monitoring - Cardiology consulted, appreciate  input  Acute Hypoxic Respiratory Failure secondary to Cardiac Arrest PMHx: asthma - Ventilator settings: PRVC  8 mL/kg, 100% FiO2, 5 PEEP, continue ventilator  support & lung protective strategies - Wean PEEP & FiO2 as tolerated, maintain SpO2 > 90% - Head of bed elevated 30 degrees, VAP protocol in place - Plateau pressures less than 30 cm H20  - Intermittent chest x-ray & ABG PRN - Daily WUA with SBT as tolerated  - Ensure adequate pulmonary hygiene  - F/u cultures, trend PCT - Consider Aspiration Pna coverage  - bronchodilators PRN - PAD protocol in place: once stabilized initiate Fentanyl drip  CKD - Strict I/O's: alert provider if UOP < 0.5 mL/kg/hr - gentle IVF hydration  - Daily BMP, replace electrolytes PRN - Avoid nephrotoxic agents as able, ensure adequate renal perfusion  At risk for anoxic encephalopathy. - CT head once stabilized - PAD protocol in place - RASS goal: -1 - Assess EEG  - repeat CT head in 24-72 hours - Neuro consult if needed   Best Practice (right click and "Reselect all SmartList Selections" daily)  Diet/type: NPO DVT prophylaxis: prophylactic heparin  GI prophylaxis: H2B Lines: N/A Foley:  Yes, and it is still needed Code Status:  full code /Last date of multidisciplinary goals of care discussion [per cardiology 10-20-2022]  Labs   CBC: Recent Labs  Lab Oct 20, 2022 1714  WBC 13.3*  HGB 11.3*  HCT 33.7*  MCV 97.1  PLT 255    Basic Metabolic Panel: Recent Labs  Lab 2022/10/20 1714  NA 141  K 3.6  CL 111  CO2 18*  GLUCOSE 117*  BUN 37*  CREATININE 2.12*  CALCIUM 9.2   GFR: Estimated Creatinine Clearance: 31.5 mL/min (A) (by C-G formula based on SCr of 2.12 mg/dL (H)). Recent Labs  Lab 20-Oct-2022 1714  WBC 13.3*    Liver Function Tests: No results for input(s): "AST", "ALT", "ALKPHOS", "BILITOT", "PROT", "ALBUMIN" in the last 168 hours. No results for input(s): "LIPASE", "AMYLASE" in the last 168 hours. No results for input(s): "AMMONIA" in the last 168 hours.  ABG    Component Value Date/Time   PHART 7.375 08/10/2022 1127   PCO2ART 28.1 (L) 08/10/2022 1127   PO2ART 62 (L)  08/10/2022 1127   HCO3 16.5 (L) 08/10/2022 1127   TCO2 17 (L) 08/10/2022 1127   ACIDBASEDEF 8.0 (H) 08/10/2022 1127   O2SAT 91 08/10/2022 1127     Coagulation Profile: Recent Labs  Lab October 20, 2022 1727  INR 1.0    Cardiac Enzymes: No results for input(s): "CKTOTAL", "CKMB", "CKMBINDEX", "TROPONINI" in the last 168 hours.  HbA1C: Hgb A1c MFr Bld  Date/Time Value Ref Range Status  04/23/2022 03:32 AM 5.5 4.8 - 5.6 % Final    Comment:    (NOTE) Pre diabetes:          5.7%-6.4%  Diabetes:              >6.4%  Glycemic control for   <7.0% adults with diabetes   01/10/2022 04:51 AM 5.9 (H) 4.8 - 5.6 % Final    Comment:    (NOTE)         Prediabetes: 5.7 - 6.4         Diabetes: >6.4         Glycemic control for adults with diabetes: <7.0     CBG: No results for input(s): "GLUCAP" in the last 168 hours.  Review of Systems:   UTA- patient intubated and unresponsive at this time.  Past Medical History:  She,  has a past medical history of Acid reflux, Allergic rhinitis (06/26/2015), Allergy, Asthma, Carpal tunnel syndrome, Chronic kidney disease, Colitis, CVA (cerebral infarction), Gout, History of syncope, Hypertension (2008), Low HDL (under 40) (06/26/2015), MI (myocardial infarction) (HCC), Migraines, Poor circulation, Renal insufficiency, Stroke (HCC), and Syncope and collapse.   Surgical History:   Past Surgical History:  Procedure Laterality Date   BUBBLE STUDY  12/03/2019   Procedure: BUBBLE STUDY;  Surgeon: Sande Rives, MD;  Location: [facility] ENDOSCOPY;  Service: Cardiovascular;;   BUBBLE STUDY  03/17/2022   Procedure: BUBBLE STUDY;  Surgeon: Thurmon Fair, MD;  Location: [facility] ENDOSCOPY;  Service: Cardiovascular;;   CARDIAC CATHETERIZATION     CESAREAN SECTION  1994   Dr. Arvil Chaco   COLONOSCOPY WITH PROPOFOL N/A 06/26/2014   Procedure: COLONOSCOPY WITH PROPOFOL;  Surgeon: Elnita Maxwell, MD;  Location: [facility] ENDOSCOPY;  Service: Endoscopy;  Laterality: N/A;    DILATION AND CURETTAGE OF UTERUS  x2 for incomplete SABs   RIGHT/LEFT HEART CATH AND CORONARY ANGIOGRAPHY Bilateral 08/10/2022   Procedure: RIGHT/LEFT HEART CATH AND CORONARY ANGIOGRAPHY;  Surgeon: Yvonne Kendall, MD;  Location: [facility] INVASIVE CV LAB;  Service: Cardiovascular;  Laterality: Bilateral;   TEE WITHOUT CARDIOVERSION N/A 12/03/2019   Procedure: TRANSESOPHAGEAL ECHOCARDIOGRAM (TEE);  Surgeon: Sande Rives, MD;  Location: [facility] ENDOSCOPY;  Service: Cardiovascular;  Laterality: N/A;   TEE WITHOUT CARDIOVERSION N/A 03/17/2022   Procedure: TRANSESOPHAGEAL ECHOCARDIOGRAM (TEE);  Surgeon: Thurmon Fair, MD;  Location[facility] ENDOSCOPY;  Service: Cardiovascular;  Laterality: N/A;     Social History:   reports that she has been smoking cigarettes. She started smoking about 5 years ago. She has a 16.6 pack-year smoking history. She has never used smokeless tobacco. She reports that she does not currently use alcohol. She reports that she does not use drugs.   Family History:  Her family history includes COPD in her mother; Coronary artery disease in her brother; Diabetes in her child; Healthy in her child; Heart disease in her brother, father, and mother; Hyperlipidemia in her brother, father, and mother; Hypertension in her brother, father, and sister; Kidney disease in her sister.   Allergies Allergies  Allergen Reactions   Coconut (Cocos Nucifera) Hives and Swelling    Just coconut ("all coconut")   Amoxicillin Other (See Comments)    "makes me sick" "swelled up everywhere" Has patient had a PCN reaction causing immediate rash, facial/tongue/throat swelling, SOB or lightheadedness with hypotension: No Has patient had a PCN reaction causing severe rash involving mucus membranes or skin necrosis: No Has patient had a PCN reaction that required hospitalization: No Has patient had a PCN reaction occurring within the last 10 years: No If all of the above answers are "NO", then may  proceed with Cephalosporin use.     Penicillins Other (See Comments)    "makes me sick" "swelled up oil" Has patient had a PCN reaction causing immediate rash, facial/tongue/throat swelling, SOB or lightheadedness with hypotension: No Has patient had a PCN reaction causing severe rash involving mucus membranes or skin necrosis: No Has patient had a PCN reaction that required hospitalization: No Has patient had a PCN reaction occurring within the last 10 years: Yes If all of the above answers are "NO", then may proceed with Cephalosporin use.    Singulair [Montelukast Sodium] Rash    "made heart race"     Home Medications  Prior to Admission medications   Medication Sig Start Date End Date  Taking? Authorizing Provider  acetaminophen (TYLENOL) 650 MG CR tablet Take 650-1,300 mg by mouth every 8 (eight) hours as needed for pain.    [provider]  albuterol (PROVENTIL HFA) 108 (90 Base) MCG/ACT inhaler Inhale 2 puffs into the lungs once every 4 (four) hours as needed for wheezing (cough). 03/22/22   Sallyanne Kuster, NP  allopurinol (ZYLOPRIM) 100 MG tablet TAKE ONE TABLET BY MOUTH ONCE DAILY. 03/22/22   Sallyanne Kuster, NP  amLODipine (NORVASC) 5 MG tablet Take 1 tablet (5 mg total) by mouth daily. 06/30/22   Sallyanne Kuster, NP  aspirin 81 MG chewable tablet Chew 1 tablet (81 mg total) by mouth daily. 04/25/22   Mathews Argyle, NP  atorvastatin (LIPITOR) 80 MG tablet Take 1 tablet (80 mg total) by mouth at bedtime. 05/04/22   Sallyanne Kuster, NP  cetirizine (ZYRTEC) 10 MG tablet Take 1 tablet (10 mg total) by mouth daily. 06/30/22   Sallyanne Kuster, NP  cloNIDine (CATAPRES) 0.3 MG tablet TAKE 1 TABLET BY MOUTH THREE TIMES DAILY(MORNING, NOON AND EVENING) 07/15/22   Sallyanne Kuster, NP  clopidogrel (PLAVIX) 75 MG tablet Take 75 mg by mouth daily.    [provider]  docusate sodium (COLACE) 100 MG capsule Take 100 mg by mouth in the morning.    [provider]  fluconazole (DIFLUCAN) 150 MG tablet TAKE 1 TABLET(150 MG) BY MOUTH 1 TIME FOR 1 DOSE. MAY TAKE AN ADDITIONAL DOSE AFTER 3 DAYS IF STILL SYMPTOMATIC 08/12/22   Abernathy, Alyssa, NP  fluticasone (FLONASE) 50 MCG/ACT nasal spray USE 1 SPRAY INTO BOTH NOSTRILS 2 TIMES A DAY Patient taking differently: Place 1 spray into both nostrils at bedtime. USE 1 SPRAY INTO BOTH NOSTRILS 2 TIMES A DAY 01/10/22   Alford Highland, MD  fluticasone-salmeterol [facility]) 250-50 MCG/ACT AEPB Inhale one puff into the lungs in the morning and at bedtime 03/22/22   Sallyanne Kuster, NP  furosemide (LASIX) 40 MG tablet Take 40 mg oral 5 times a week Monday thru Friday Patient taking differently: Take 40 mg by mouth daily. Take 40 mg oral daily 09/01/22   Charlsie Quest, NP  losartan (COZAAR) 50 MG tablet Take 25 mg by mouth daily.    [provider]  nystatin (MYCOSTATIN/NYSTOP) powder Apply 1 Application topically 3 (three) times daily. 05/04/22   Sallyanne Kuster, NP  pantoprazole (PROTONIX) 40 MG tablet Take 1 tablet (40 mg total) by mouth 2 (two) times daily before a meal. 05/04/22   Abernathy, Alyssa, NP  potassium chloride (KLOR-CON M) 10 MEQ tablet Take 1 tablet (10 mEq total) by mouth every other day. To take whenever you take lasix 08/31/22   Charlsie Quest, NP  promethazine (PHENERGAN) 25 MG tablet Take 1 tablet (25 mg total) by mouth as needed for nausea or vomiting. 03/22/22   Sallyanne Kuster, NP  Ubrogepant (UBRELVY) 100 MG TABS Take 1 tablet (100 mg total) by mouth as needed. May repeat after 2 hours.  Maximum 2 tablets in 24 hours. 08/30/22   Drema Dallas, DO     Critical care time: 60 minutes       Betsey Holiday, AGACNP-BC Acute Care Nurse Practitioner [facility]   [phone] / [phone] Please see Amion for details.

## 2022-10-05 NOTE — ED Provider Notes (Signed)
[facility] Department of Emergency Medicine   Code Blue CONSULT NOTE  Chief Complaint: Cardiac arrest/unresponsive   Level V Caveat: Unresponsive  History of present illness: I was contacted by the hospital for a CODE BLUE cardiac arrest at the cardiac catheterization lab.  Patient was just admitted for a STEMI. Upon arrival patient with active cardiac catheterization found to be in significant respiratory distress agonal breathing being bagged to assist ventilations.  ROS: Unable to obtain, Level V caveat  Scheduled Meds:  docusate  100 mg Per Tube BID   famotidine  20 mg Per Tube BID   fentaNYL (SUBLIMAZE) injection  50 mcg Intravenous Once   heparin  5,000 Units Subcutaneous Q8H   polyethylene glycol  17 g Per Tube Daily   sodium chloride flush  3 mL Intravenous Q12H   Continuous Infusions:  sodium chloride 20 mL/hr at 2022-10-10 1735   sodium chloride     famotidine (PEPCID) IV     fentaNYL infusion INTRAVENOUS     norepinephrine (LEVOPHED) Adult infusion     PRN Meds:.sodium chloride, acetaminophen, docusate sodium, fentaNYL, fentaNYL, Heparin (Porcine) in NaCl, heparin sodium (porcine), lidocaine (PF), midazolam, midazolam, [MAR Hold] nitroGLYCERIN, ondansetron (ZOFRAN) IV, ondansetron, polyethylene glycol, sodium chloride flush Past Medical History:  Diagnosis Date   Acid reflux    Allergic rhinitis 06/26/2015   Allergy    Asthma    Carpal tunnel syndrome    right hand   Chronic kidney disease    Colitis    CVA (cerebral infarction)    2009, 2015   Gout    History of syncope    Hypertension 2008   Low HDL (under 40) 06/26/2015   MI (myocardial infarction) (HCC)    2015 - patient wasnt told by cardiologist that she had MI but has had cardiac cath in past   Migraines    Poor circulation    Renal insufficiency    Stroke [facility])    Syncope and collapse    Past Surgical History:  Procedure Laterality Date   BUBBLE STUDY  12/03/2019    Procedure: BUBBLE STUDY;  Surgeon: Sande Rives, MD;  Location: [facility] ENDOSCOPY;  Service: Cardiovascular;;   BUBBLE STUDY  03/17/2022   Procedure: BUBBLE STUDY;  Surgeon: Thurmon Fair, MD;  Location: [facility] ENDOSCOPY;  Service: Cardiovascular;;   CARDIAC CATHETERIZATION     CESAREAN SECTION  1994   Dr. Arvil Chaco   COLONOSCOPY WITH PROPOFOL N/A 06/26/2014   Procedure: COLONOSCOPY WITH PROPOFOL;  Surgeon: Elnita Maxwell, MD;  Location: [facility] ENDOSCOPY;  Service: Endoscopy;  Laterality: N/A;   DILATION AND CURETTAGE OF UTERUS  x2 for incomplete SABs   RIGHT/LEFT HEART CATH AND CORONARY ANGIOGRAPHY Bilateral 08/10/2022   Procedure: RIGHT/LEFT HEART CATH AND CORONARY ANGIOGRAPHY;  Surgeon: Yvonne Kendall, MD;  Location: [facility] INVASIVE CV LAB;  Service: Cardiovascular;  Laterality: Bilateral;   TEE WITHOUT CARDIOVERSION N/A 12/03/2019   Procedure: TRANSESOPHAGEAL ECHOCARDIOGRAM (TEE);  Surgeon: Sande Rives, MD;  Location: [facility] ENDOSCOPY;  Service: Cardiovascular;  Laterality: N/A;   TEE WITHOUT CARDIOVERSION N/A 03/17/2022   Procedure: TRANSESOPHAGEAL ECHOCARDIOGRAM (TEE);  Surgeon: Thurmon Fair, MD;  Location: [facility] ENDOSCOPY;  Service: Cardiovascular;  Laterality: N/A;   Social History   Socioeconomic History   Marital status: Married    Spouse name: Not on file   Number of children: 2   Years of education: 11th grade   Highest education level: 11th grade  Occupational History   Occupation: unemployed  Tobacco  Use   Smoking status: Every Day    Current packs/day: 0.50    Average packs/day: 0.5 packs/day for 33.1 years (16.6 ttl pk-yrs)    Types: Cigarettes    Start date: 03/04/2017   Smokeless tobacco: Never  Vaping Use   Vaping status: Never Used  Substance and Sexual Activity   Alcohol use: Not Currently    Comment: rare- drinks an occassional wine cooler   Drug use: No   Sexual activity: Yes    Partners: Male    Birth control/protection: None  Other Topics Concern    Not on file  Social History Narrative   Husband is a Naval architect for [employer] and is the sole provider for the family including 2 children (46 yr old and 1 yr old)    Goes to food bank for food. Says "really worried" about water and light bill. Husband, baby, 32 year old, her, and mother all live together.    Patient would like [facility] with [facility].       Pt is right handed   Social Determinants of Health   Financial Resource Strain: Low Risk  (05/20/2022)   Overall Financial Resource Strain (CARDIA)    Difficulty of Paying Living Expenses: Not very hard  Food Insecurity: Food Insecurity Present (04/23/2022)   Hunger Vital Sign    Worried About Running Out of Food in the Last Year: Often true    Ran Out of Food in the Last Year: Often true  Transportation Needs: Unmet Transportation Needs (04/27/2022)   PRAPARE - Administrator, Civil Service (Medical): Yes    Lack of Transportation (Non-Medical): Yes  Physical Activity: Insufficiently Active (05/20/2022)   Exercise Vital Sign    Days of Exercise per Week: 2 days    Minutes of Exercise per Session: 60 min  Stress: No Stress Concern Present (08/05/2022)   [employer] of Occupational Health - Occupational Stress Questionnaire    Feeling of Stress : Only a little  Social Connections: Moderately Isolated (06/24/2022)   Social Connection and Isolation Panel [NHANES]    Frequency of Communication with Friends and Family: More than three times a week    Frequency of Social Gatherings with Friends and Family: More than three times a week    Attends Religious Services: Never    Database administrator or Organizations: No    Attends Banker Meetings: Never    Marital Status: Married  Catering manager Violence: Not At Risk (09/14/2022)   Humiliation, Afraid, Rape, and Kick questionnaire    Fear of Current or Ex-Partner: No    Emotionally Abused: No    Physically Abused: No    Sexually Abused: No    Allergies  Allergen Reactions   Coconut (Cocos Nucifera) Hives and Swelling    Just coconut ("all coconut")   Amoxicillin Other (See Comments)    "makes me sick" "swelled up everywhere" Has patient had a PCN reaction causing immediate rash, facial/tongue/throat swelling, SOB or lightheadedness with hypotension: No Has patient had a PCN reaction causing severe rash involving mucus membranes or skin necrosis: No Has patient had a PCN reaction that required hospitalization: No Has patient had a PCN reaction occurring within the last 10 years: No If all of the above answers are "NO", then may proceed with Cephalosporin use.     Penicillins Other (See Comments)    "makes me sick" "swelled up oil" Has patient had a PCN reaction causing immediate rash, facial/tongue/throat swelling, SOB  or lightheadedness with hypotension: No Has patient had a PCN reaction causing severe rash involving mucus membranes or skin necrosis: No Has patient had a PCN reaction that required hospitalization: No Has patient had a PCN reaction occurring within the last 10 years: Yes If all of the above answers are "NO", then may proceed with Cephalosporin use.    Singulair [Montelukast Sodium] Rash    "made heart race"    Last set of Vital Signs (not current) Vitals:   10/02/2022 1755 10/04/2022 1812  BP: 124/79   Pulse: 84   Resp: 16   Temp:    SpO2: 100% 99%      Physical Exam Gen: unresponsive, agonal respirations with thick secretions Cardiovascular: Patient maintaining a pulse around 100 bpm Resp: Agonal respirations significant secretions  Procedures (when applicable, including Critical Care time):  INTUBATION Performed by: Minna Antis  Required items: required blood products, implants, devices, and special equipment available Patient identity confirmed: provided demographic data and hospital-assigned identification number Time out: Immediately prior to procedure a "time out" was called to  verify the correct patient, procedure, equipment, support staff and site/side marked as required.  Indications: Respiratory arrest  Intubation method: s4 Glidescope Laryngoscopy   Preoxygenation: 100% BVM  Sedatives: 20 mg etomidate Paralytic: 120 mg succinylcholine  Tube Size: 1.5 cuffed  Post-procedure assessment: chest rise and ETCO2 monitor Breath sounds: equal and absent over the epigastrium Tube secured with: ETT holder  Patient tolerated the procedure well with no immediate complications.   MDM / Assessment and Plan Patient was just admitted from the emergency department for a STEMI with acute onset of chest pain.  Patient was in the middle of a cardiac catheterization when she began experiencing agonal respirations significant secretions and respiratory distress.  I was able to suction out the patient continue with bag-valve-mask ventilations while setting up intubation equipment.  I was able to intubate with an S4 GlideScope 7.5 tube.  Patient did become bradycardic after receiving etomidate and succinylcholine however this quickly increased again to a tachycardic rhythm around 120 bpm without medical intervention.  Patient intubated secretions suctioned.  Verbally ordered propofol for sedation.  At this time intensivist Dr. Belia Heman is also at bedside.  We will turn over care to the ICU team.   Minna Antis, MD 09/08/2022 [phone]

## 2022-10-05 NOTE — Progress Notes (Signed)
09/28/2022 1742  Spiritual Encounters  Type of Visit Initial  Care provided to: Pt and family  Conversation partners present during encounter Physician;Nurse  Referral source Code page  Reason for visit Code  OnCall Visit Yes   Chaplain responded to Code STEMI. Family at bedside.Chaplain services available to follow up if/when patient goes to Cath Lab.

## 2022-10-05 NOTE — Interval H&P Note (Signed)
History and Physical Interval Note:  09/27/2022 6:06 PM  Gabrielle Schultz  has presented today for surgery, with the diagnosis of STEMI.  The various methods of treatment have been discussed with the patient and family. After consideration of risks, benefits and other options for treatment, the patient has consented to  Procedure(s): Coronary/Graft Acute MI Revascularization (N/A) LEFT HEART CATH AND CORONARY ANGIOGRAPHY (N/A) as a surgical intervention.  The patient's history has been reviewed, patient examined, no change in status, stable for surgery.  I have reviewed the patient's chart and labs.  Questions were answered to the patient's satisfaction.    Cath Lab Visit (complete for each Cath Lab visit)  Clinical Evaluation Leading to the Procedure:   ACS: Yes.    Non-ACS:  N/A  Margarett Viti

## 2022-10-05 NOTE — Significant Event (Signed)
Gabrielle Schultz contacted [employer] [place]. Chart being reviewed.

## 2022-10-05 NOTE — ED Notes (Signed)
Code  stemi  called  to  [facility]

## 2022-10-05 NOTE — ED Provider Triage Note (Signed)
Emergency Medicine Provider Triage Evaluation Note  Gabrielle Schultz , a 52 y.o. female  was evaluated in triage.  Pt complains of chest pain. Pain started about 10 minutes ago.   Review of Systems  Positive: Chest pain, sob Negative: Light headed, dizziness  Physical Exam  Ht 5\' 1"  (1.549 m)   Wt 73.9 kg   LMP 08/18/2020 (Approximate) Comment: Tubal ligation  BMI 30.80 kg/m  Gen:   Awake, no distress   Resp:  Normal effort  MSK:   Moves extremities without difficulty  Other:   Medical Decision Making  Medically screening exam initiated at 5:14 PM.  Appropriate orders placed.  [name] was informed that the remainder of the evaluation will be completed by another provider, this initial triage assessment does not replace that evaluation, and the importance of remaining in the ED until their evaluation is complete.     Cameron Ali, PA-C 09/27/2022 1715

## 2022-10-05 NOTE — Progress Notes (Signed)
16-Oct-2022 1847  Spiritual Encounters  Type of Visit Follow up  Care provided to: Family  Referral source Code page  Reason for visit Patient death  OnCall Visit Yes  Spiritual Framework  Presenting Themes Significant life change  Family Stress Factors Major life changes  Interventions  Spiritual Care Interventions Made Compassionate presence;Normalization of emotions;Bereavement/grief support;Meditation   Chaplain responded to two Code Blues in Cath Lab. Chaplain remained with family in waiting area. The situation followed with the death of the patient and the Chaplain has remained with the family and escorted them to ICU 10 where the family can view their loved one. Chaplain remains available.

## 2022-10-05 NOTE — Significant Event (Signed)
1848: Staff requested Code Blue be called. Code Blue called. Pt apneic, BVM with high flow O2 1850 20mg  etomidate IV push 1850 120 sux IV push followed with flush . Dr. Lenard Lance intubated pt on first attempt with glide scope, using 7.5. ETCO2 used, cords visualized per MD.Rise and fall of chest noted.  Tube secured Bilateral breath sounds per staff. 1912 epi given, followed by saline flush Effective CPR being performed changing out compressor at recommended intervals. 1917 no pulse, cpr continues 1917 1 amp bicarb 1919 1 amp epi 1918+ pulse, HR 68 SR 1924 bicarb given 1925+pulse HR 44 1926: No pulse CPR started. 1926 epi given 1928 epi pushed 1929+ pulse Epi gtt hung, Neo drip hung Dopamine gtt hung 1930 bicarb pushed 1931 Lost pulse CPR resumed 1931 1 epi pushed 1937 bicarb pushed 1941 bicarb pushed Remains pulseless, effective CPR being performed and ventilation with high flow O2 1942 epi gtt increased  1944 Time of death called by Dr. Okey Dupre. No pulse, no spontaneous respirations. Chaplin with family. Dr. Okey Dupre spoke with family. Deceased will be moved to ICU so family may view. During code, cath lab team present. EDP  and some ED staff present. CPR performance strong and effective. Dr. Okey Dupre spoke with Dr. Vincente Poli consulted by Dr. Okey Dupre by telephone. Cheryll Cockayne Chester Rust APNP present and assisting. Dr. Santiago Glad present. Allene Pyo, RN Administrative Coordinator and Grant Ruts RN [phone]) present.

## 2022-10-05 NOTE — Death Summary Note (Signed)
Discharge Summary    Patient ID: ETTY MODEL MRN: [id number]; DOB: [birth year]  Admit date: 29-Sep-2022 Discharge date: 2022-09-29  PCP:  Sallyanne Kuster, NP   [facility] Providers Cardiologist:  Julien Nordmann, MD  Electrophysiologist:  Lanier Prude, MD       Discharge Diagnoses    Principal Problem:   STEMI (ST elevation myocardial infarction) [facility]) Active Problems:   Cardiac arrest [facility])    Diagnostic Studies/Procedures    LHC (09/29/22): Multivessel coronary artery disease, as detailed below.  Culprit for the patient's STEMI is a filling defect in the proximal LAD involving D1 concerning for coronary embolism.  70% mid LAD and sequential 50-80% RCA stenoses are similar to last month's catheterization. PCI to proximal/mid LAD and D1 was attempted.  TIMI-2 flow was reestablished in the LAD, though occlusion of the ostium of D1 occurred due to shift of intraluminal material in the LAD; only TIMI-1 flow could be restored to D1 despite multiple angioplasties. Patient passed away before stent placement was attempted due to refractory cardiogenic shock and pulseless electrical activity. Diagnostic Dominance: Right  Intervention   _____________   History of Present Illness     Gabrielle Schultz is a 52 y.o. female with severe aortic valve regurgitation, multivessel coronary artery disease, recurrent strokes, PAD, and hypertension, admitted with sudden onset of chest pain around 5 PM and associated heaviness in both arms.  In the ED, she was found to have new ST elevation in the anterior leads on her initial EKG and new left bundle branch block on repeat.  Due to severe ongoing chest pain, she was referred for emergent cardiac catheterization and possible PCI.  Hospital Course     Consultants: CCM   Patient was taken from the ED to the cardiac cath lab for emergent cardiac catheterization and possible PCI.  She was found to have a new filling defect in the  proximal/mid LAD leading to 99% stenosis and TIMI-1 flow through the LAD.  Angioplasty of the LAD and D1 was performed.  However, during the procedure the patient developed progressive hypotension consistent with cardiogenic shock.  She also had worsening respiratory distress and ultimately required intubation and mechanical ventilation.  She developed refractory shock and pulseless electrical activity for which she received multiple rounds of epinephrine and sodium bicarb and that as well as despite extensive resuscitative efforts, sustained blood pressure could not be obtained.  She was not a candidate for mechanical support due to her PAD and severe aortic regurgitation.  She died at 69 on 09/29/2022.    _____________  Discharge Vitals Blood pressure 118/73, pulse (!) 0, temperature (!) 97.1 F (36.2 C), temperature source Axillary, resp. rate (!) 4, height 5\' 1"  (1.549 m), weight 83.6 kg, last menstrual period 08/18/2020, SpO2 100%.  Filed Weights   09/29/22 1713 2022/09/29 1741  Weight: 73.9 kg 83.6 kg    Labs & Radiologic Studies    CBC Recent Labs    09-29-2022 1714 2022-09-29 1929  WBC 13.3*  --   HGB 11.3* 12.6  HCT 33.7* 37.0  MCV 97.1  --   PLT 255  --    Basic Metabolic Panel Recent Labs    44[birth year]47 1714 September 29, 2022 1929  NA 141 146*  K 3.6 5.0  CL 111  --   CO2 18*  --   GLUCOSE 117*  --   BUN 37*  --   CREATININE 2.12*  --   CALCIUM 9.2  --  Liver Function Tests No results for input(s): "AST", "ALT", "ALKPHOS", "BILITOT", "PROT", "ALBUMIN" in the last 72 hours. No results for input(s): "LIPASE", "AMYLASE" in the last 72 hours. High Sensitivity Troponin:   Recent Labs  Lab 10-19-22 1714  TROPONINIHS 90*    BNP Invalid input(s): "POCBNP" D-Dimer No results for input(s): "DDIMER" in the last 72 hours. Hemoglobin A1C No results for input(s): "HGBA1C" in the last 72 hours. Fasting Lipid Panel Recent Labs    10/19/2022 1727  CHOL 125  HDL 26*  LDLCALC 59   TRIG 201*  CHOLHDL 4.8   Thyroid Function Tests No results for input(s): "TSH", "T4TOTAL", "T3FREE", "THYROIDAB" in the last 72 hours.  Invalid input(s): "FREET3" _____________  CARDIAC CATHETERIZATION  Result Date: 19-Oct-2022 Conclusion: Multivessel coronary artery disease, as detailed below.  Culprit for the patient's STEMI is a filling defect in the proximal LAD involving D1 concerning for coronary embolism.  70% mid LAD and sequential 50-80% RCA stenoses are similar to last month's catheterization. PCI to proximal/mid LAD and D1 was attempted.  TIMI-2 flow was reestablished in the LAD, though occlusion of the ostium of D1 occurred due to shift of intraluminal material in the LAD; only TIMI-1 flow could be restored to D1 despite multiple angioplasties. Patient passed away before stent placement was attempted due to refractory cardiogenic shock and pulseless electrical activity. Yvonne Kendall, MD [facility]  DG Chest Port 1 View  Result Date: 19-Oct-2022 CLINICAL DATA:  Chest pain. EXAM: PORTABLE CHEST 1 VIEW COMPARISON:  December 02, 2021. FINDINGS: Stable cardiomediastinal silhouette. Both lungs are clear. The visualized skeletal structures are unremarkable. IMPRESSION: No active disease. Electronically Signed   By: Lupita Raider M.D.   On: 2022-10-19 18:28   CUP PACEART REMOTE DEVICE CHECK  Result Date: 08/31/2022 ILR summary report received. Battery status OK. Normal device function. No new symptom, tachy, brady, or pause episodes. No new AF episodes. Monthly summary reports and ROV/PRN LA, CVRS  Disposition   The patient died in the cath lab.  Her family was notified of the events.  Follow-up Plans & Appointments    N/A    Discharge Medications   N/A  Signed, Yvonne Kendall, MD 2022-10-19, 10:06 PM

## 2022-10-05 NOTE — H&P (View-Only) (Signed)
Cardiology Consultation   Patient ID: Gabrielle Schultz MRN: [id number]; DOB: Apr 30, 1970  Admit date: Oct 15, 2022 Date of Consult: 10/15/2022  PCP:  Sallyanne Kuster, NP   [facility] Providers Cardiologist:  Julien Nordmann, MD  Electrophysiologist:  Lanier Prude, MD       Patient Profile:   Gabrielle Schultz is a 52 y.o. female with a hx of severe aortic valve regurgitation, coronary artery disease, stroke, PAD, hypertension, who is being seen 10/15/22 for the evaluation of chest pain and abnormal EKG at the request of Dr. Lenard Lance.  History of Present Illness:   [name] was in her usual state of health until this afternoon when she had sudden onset of chest pain.  She describes it as severe in the center of her chest with heaviness in both arms.  The pain does not radiate anywhere.  She also feels short of breath.  She is unable to provide further details, as she is in too much pain.  She underwent diagnostic catheterization last month for workup of aortic valve disease.  This showed 70% mid LAD and sequential 50% and 80% RCA lesions.  She was evaluated by cardiac surgery and is scheduled for CABG/AVR next month.   Past Medical History:  Diagnosis Date   Acid reflux    Allergic rhinitis 06/26/2015   Allergy    Asthma    Carpal tunnel syndrome    right hand   Chronic kidney disease    Colitis    CVA (cerebral infarction)    2009, 2015   Gout    History of syncope    Hypertension 2008   Low HDL (under 40) 06/26/2015   MI (myocardial infarction) (HCC)    2015 - patient wasnt told by cardiologist that she had MI but has had cardiac cath in past   Migraines    Poor circulation    Renal insufficiency    Stroke [facility])    Syncope and collapse     Past Surgical History:  Procedure Laterality Date   BUBBLE STUDY  12/03/2019   Procedure: BUBBLE STUDY;  Surgeon: Sande Rives, MD;  Location: [facility] ENDOSCOPY;  Service: Cardiovascular;;   BUBBLE STUDY   03/17/2022   Procedure: BUBBLE STUDY;  Surgeon: Thurmon Fair, MD;  Location: [facility] ENDOSCOPY;  Service: Cardiovascular;;   CARDIAC CATHETERIZATION     CESAREAN SECTION  1994   Dr. Arvil Chaco   COLONOSCOPY WITH PROPOFOL N/A 06/26/2014   Procedure: COLONOSCOPY WITH PROPOFOL;  Surgeon: Elnita Maxwell, MD;  Location: [facility] ENDOSCOPY;  Service: Endoscopy;  Laterality: N/A;   DILATION AND CURETTAGE OF UTERUS  x2 for incomplete SABs   RIGHT/LEFT HEART CATH AND CORONARY ANGIOGRAPHY Bilateral 08/10/2022   Procedure: RIGHT/LEFT HEART CATH AND CORONARY ANGIOGRAPHY;  Surgeon: Yvonne Kendall, MD;  Location: [facility] INVASIVE CV LAB;  Service: Cardiovascular;  Laterality: Bilateral;   TEE WITHOUT CARDIOVERSION N/A 12/03/2019   Procedure: TRANSESOPHAGEAL ECHOCARDIOGRAM (TEE);  Surgeon: Sande Rives, MD;  Location: [facility] ENDOSCOPY;  Service: Cardiovascular;  Laterality: N/A;   TEE WITHOUT CARDIOVERSION N/A 03/17/2022   Procedure: TRANSESOPHAGEAL ECHOCARDIOGRAM (TEE);  Surgeon: Thurmon Fair, MD;  Location: [facility] ENDOSCOPY;  Service: Cardiovascular;  Laterality: N/A;      Inpatient Medications: Scheduled Meds:  fentaNYL (SUBLIMAZE) injection  50 mcg Intravenous Once   Continuous Infusions:  sodium chloride 20 mL/hr at 2022-10-15 1735   PRN Meds: nitroGLYCERIN  Allergies:    Allergies  Allergen Reactions   Coconut (Cocos Nucifera) Hives and  Swelling    Just coconut ("all coconut")   Amoxicillin Other (See Comments)    "makes me sick" "swelled up everywhere" Has patient had a PCN reaction causing immediate rash, facial/tongue/throat swelling, SOB or lightheadedness with hypotension: No Has patient had a PCN reaction causing severe rash involving mucus membranes or skin necrosis: No Has patient had a PCN reaction that required hospitalization: No Has patient had a PCN reaction occurring within the last 10 years: No If all of the above answers are "NO", then may proceed with Cephalosporin use.      Penicillins Other (See Comments)    "makes me sick" "swelled up oil" Has patient had a PCN reaction causing immediate rash, facial/tongue/throat swelling, SOB or lightheadedness with hypotension: No Has patient had a PCN reaction causing severe rash involving mucus membranes or skin necrosis: No Has patient had a PCN reaction that required hospitalization: No Has patient had a PCN reaction occurring within the last 10 years: Yes If all of the above answers are "NO", then may proceed with Cephalosporin use.    Singulair [Montelukast Sodium] Rash    "made heart race"    Social History:   Social History   Tobacco Use   Smoking status: Every Day    Current packs/day: 0.50    Average packs/day: 0.5 packs/day for 33.1 years (16.6 ttl pk-yrs)    Types: Cigarettes    Start date: 03/04/2017   Smokeless tobacco: Never  Vaping Use   Vaping status: Never Used  Substance Use Topics   Alcohol use: Not Currently    Comment: rare- drinks an occassional wine cooler   Drug use: No      Family History:   Family History  Problem Relation Age of Onset   Hyperlipidemia Mother    Heart disease Mother        has a defibrillator   COPD Mother    Hypertension Father    Heart disease Father    Hyperlipidemia Father    Hypertension Sister        died in her 30s from heart failure and Dollie Bressi stage kidney disease   Kidney disease Sister    Hypertension Brother    Heart disease Brother        has a defibrillator   Coronary artery disease Brother        had CABG   Hyperlipidemia Brother    Healthy Child    Diabetes Child      ROS:  Please see the history of present illness. All other ROS reviewed and negative.     Physical Exam/Data:   Vitals:   2022/10/13 1713 Oct 13, 2022 1714  BP:  (!) 103/50  Pulse:  80  Resp:  18  Temp:  (!) 97.1 F (36.2 C)  TempSrc:  Axillary  SpO2:  100%  Weight: 73.9 kg   Height: 5\' 1"  (1.549 m)    No intake or output data in the 24 hours ending 10/13/2022 1738     10-13-22    5:13 PM 09/08/2022   10:07 AM 08/31/2022   10:44 AM  Last 3 Weights  Weight (lbs) 163 lb 163 lb 161 lb  Weight (kg) 73.936 kg 73.936 kg 73.029 kg     Body mass index is 30.8 kg/m.  General:  Uncomfortable appearing woman lying in bed.  Husband is at the bedside. HEENT: normal Neck: JVP difficult to assess due to body habitus. Vascular: No carotid bruits; Distal pulses 2+ bilaterally Cardiac: Regular rate and rhythm with  1/6 systolic murmur. Lungs: Mildly diminished breath sounds throughout without wheezes or crackles. Abd: soft, nontender, no hepatomegaly  Ext: no edema Musculoskeletal:  No deformities, BUE and BLE strength normal and equal Skin: Cool and clammy. Neuro:  CNs 2-12 intact, no focal abnormalities noted Psych: Patient appears uncomfortable.  EKG:  The EKG was personally reviewed and demonstrates: Normal sinus rhythm with new left bundle branch block and concordant ST elevation in V2.  PVC also present. Telemetry:  Telemetry was personally reviewed and demonstrates: Sinus rhythm with PVCs.  Relevant CV Studies: R/LHC (08/10/2022): Significant two-vessel coronary artery with 70% mid LAD stenosis and sequential 50% and 80% proximal/mid RCA lesions. Moderately elevated left and right heart filling pressures. Moderate pulmonary hypertension. Normal/supranormal Fick cardiac output/index. No significant aortic valve gradient. Small right radial artery, not able to be used for catheterization. Moderate stenosis of right common femoral artery.  Laboratory Data:  Hematology Recent Labs  Lab 10/04/2022 1714  WBC 13.3*  RBC 3.47*  HGB 11.3*  HCT 33.7*  MCV 97.1  MCH 32.6  MCHC 33.5  RDW 16.6*  PLT 255    Radiology/Studies:  No results found.   Assessment and Plan:   STEMI: Patient presents with acute onset of chest pain with initial EKG demonstrating new anterior ST segment elevation.  Subsequent EKG shows left bundle branch block with concordant ST  elevation in V2.  Findings are concerning for acute MI.  Patient also appears unwell with soft blood pressure.  I have recommended proceeding with emergent cardiac catheterization and possible PCI, which she is in agreement with.  She has known CKD; we will need to be mindful of her contrast exposure.  Aortic regurgitation: Thought to be severe by TEE with plans for CABG/AVR next month.  However, in the setting of suspected STEMI, valve intervention may need to be deferred.  Further recommendations to follow catheterization.  Chronic kidney disease: Creatinine over the last year has typically been in the 1 5-2.0 range, though it was 2.5 on last check on 8/27.  In the setting of her sudden onset of chest pain and new EKG changes concerning for acute infarct, we will need to proceed with catheterization and support her kidneys as well as possible.  For questions or updates, please contact [facility] Please consult [email or web address] for contact info under [facility].  Signed, Yvonne Kendall, MD  09/23/2022 5:38 PM

## 2022-10-05 NOTE — ED Notes (Signed)
XRAY to bedside

## 2022-10-05 NOTE — ED Notes (Signed)
Dr. Okey Dupre, Cardiology at bedside.

## 2022-10-05 DEATH — deceased

## 2022-10-15 ENCOUNTER — Ambulatory Visit: Payer: Medicaid Other | Admitting: Obstetrics and Gynecology

## 2022-10-26 ENCOUNTER — Other Ambulatory Visit (HOSPITAL_COMMUNITY): Payer: Medicaid Other

## 2022-10-26 ENCOUNTER — Encounter (HOSPITAL_COMMUNITY): Payer: Medicaid Other

## 2022-10-28 ENCOUNTER — Inpatient Hospital Stay (HOSPITAL_COMMUNITY): Admit: 2022-10-28 | Payer: Medicaid Other | Admitting: Surgery

## 2022-10-28 SURGERY — REPLACEMENT, AORTIC VALVE, OPEN
Anesthesia: General | Site: Chest

## 2022-12-10 ENCOUNTER — Ambulatory Visit: Payer: Medicaid Other | Admitting: Cardiology

## 2023-05-31 ENCOUNTER — Ambulatory Visit: Payer: Medicaid Other | Admitting: Neurology

## 2023-07-04 ENCOUNTER — Encounter: Payer: Medicaid Other | Admitting: Nurse Practitioner
